# Patient Record
Sex: Female | Born: 1940 | Race: White | Hispanic: No | Smoking: Never smoker
Health system: Southern US, Community
[De-identification: ages and names within clinical notes are randomized; demographics above are authoritative.]

## PROBLEM LIST (undated history)

## (undated) DIAGNOSIS — K219 Gastro-esophageal reflux disease without esophagitis: Secondary | ICD-10-CM

## (undated) DIAGNOSIS — N6019 Diffuse cystic mastopathy of unspecified breast: Secondary | ICD-10-CM

## (undated) DIAGNOSIS — F329 Major depressive disorder, single episode, unspecified: Secondary | ICD-10-CM

## (undated) DIAGNOSIS — I499 Cardiac arrhythmia, unspecified: Secondary | ICD-10-CM

## (undated) DIAGNOSIS — K8010 Calculus of gallbladder with chronic cholecystitis without obstruction: Secondary | ICD-10-CM

## (undated) DIAGNOSIS — R5383 Other fatigue: Secondary | ICD-10-CM

## (undated) DIAGNOSIS — I5032 Chronic diastolic (congestive) heart failure: Secondary | ICD-10-CM

## (undated) DIAGNOSIS — Z972 Presence of dental prosthetic device (complete) (partial): Secondary | ICD-10-CM

## (undated) DIAGNOSIS — Z87440 Personal history of urinary (tract) infections: Secondary | ICD-10-CM

## (undated) DIAGNOSIS — G43909 Migraine, unspecified, not intractable, without status migrainosus: Secondary | ICD-10-CM

## (undated) DIAGNOSIS — Z7901 Long term (current) use of anticoagulants: Secondary | ICD-10-CM

## (undated) DIAGNOSIS — M1990 Unspecified osteoarthritis, unspecified site: Secondary | ICD-10-CM

## (undated) DIAGNOSIS — N189 Chronic kidney disease, unspecified: Secondary | ICD-10-CM

## (undated) DIAGNOSIS — K5790 Diverticulosis of intestine, part unspecified, without perforation or abscess without bleeding: Secondary | ICD-10-CM

## (undated) DIAGNOSIS — I4891 Unspecified atrial fibrillation: Secondary | ICD-10-CM

## (undated) DIAGNOSIS — Z8719 Personal history of other diseases of the digestive system: Secondary | ICD-10-CM

## (undated) DIAGNOSIS — M109 Gout, unspecified: Secondary | ICD-10-CM

## (undated) DIAGNOSIS — I10 Essential (primary) hypertension: Secondary | ICD-10-CM

## (undated) DIAGNOSIS — F32A Depression, unspecified: Secondary | ICD-10-CM

## (undated) DIAGNOSIS — F419 Anxiety disorder, unspecified: Secondary | ICD-10-CM

## (undated) DIAGNOSIS — R0600 Dyspnea, unspecified: Secondary | ICD-10-CM

## (undated) DIAGNOSIS — M545 Low back pain: Secondary | ICD-10-CM

## (undated) DIAGNOSIS — K08109 Complete loss of teeth, unspecified cause, unspecified class: Secondary | ICD-10-CM

## (undated) DIAGNOSIS — I4821 Permanent atrial fibrillation: Secondary | ICD-10-CM

## (undated) DIAGNOSIS — G4700 Insomnia, unspecified: Secondary | ICD-10-CM

## (undated) DIAGNOSIS — K449 Diaphragmatic hernia without obstruction or gangrene: Secondary | ICD-10-CM

## (undated) DIAGNOSIS — E785 Hyperlipidemia, unspecified: Secondary | ICD-10-CM

## (undated) DIAGNOSIS — N393 Stress incontinence (female) (male): Secondary | ICD-10-CM

## (undated) DIAGNOSIS — I872 Venous insufficiency (chronic) (peripheral): Secondary | ICD-10-CM

## (undated) DIAGNOSIS — Z23 Encounter for immunization: Secondary | ICD-10-CM

## (undated) DIAGNOSIS — M8580 Other specified disorders of bone density and structure, unspecified site: Secondary | ICD-10-CM

## (undated) DIAGNOSIS — M419 Scoliosis, unspecified: Secondary | ICD-10-CM

## (undated) DIAGNOSIS — K5730 Diverticulosis of large intestine without perforation or abscess without bleeding: Secondary | ICD-10-CM

## (undated) DIAGNOSIS — N390 Urinary tract infection, site not specified: Secondary | ICD-10-CM

## (undated) DIAGNOSIS — H269 Unspecified cataract: Secondary | ICD-10-CM

## (undated) DIAGNOSIS — R600 Localized edema: Secondary | ICD-10-CM

## (undated) HISTORY — DX: Essential (primary) hypertension: I10

## (undated) HISTORY — PX: OTHER SURGICAL HISTORY: SHX169

## (undated) HISTORY — PX: BREAST CYST ASPIRATION: SHX578

## (undated) HISTORY — DX: Cardiac arrhythmia, unspecified: I49.9

## (undated) HISTORY — DX: Diverticulosis of intestine, part unspecified, without perforation or abscess without bleeding: K57.90

## (undated) HISTORY — DX: Depression, unspecified: F32.A

## (undated) HISTORY — DX: Migraine, unspecified, not intractable, without status migrainosus: G43.909

## (undated) HISTORY — DX: Scoliosis, unspecified: M41.9

## (undated) HISTORY — PX: BREAST EXCISIONAL BIOPSY: SUR124

## (undated) HISTORY — DX: Unspecified atrial fibrillation: I48.91

## (undated) HISTORY — DX: Encounter for immunization: Z23

## (undated) HISTORY — DX: Hyperlipidemia, unspecified: E78.5

## (undated) HISTORY — DX: Low back pain: M54.5

## (undated) HISTORY — DX: Anxiety disorder, unspecified: F41.9

## (undated) HISTORY — DX: Urinary tract infection, site not specified: N39.0

## (undated) HISTORY — DX: Diffuse cystic mastopathy of unspecified breast: N60.19

## (undated) HISTORY — DX: Gout, unspecified: M10.9

## (undated) HISTORY — DX: Personal history of other diseases of the digestive system: Z87.19

## (undated) HISTORY — PX: BLEPHAROPLASTY: SUR158

## (undated) HISTORY — DX: Unspecified cataract: H26.9

## (undated) HISTORY — DX: Other specified disorders of bone density and structure, unspecified site: M85.80

## (undated) HISTORY — DX: Other fatigue: R53.83

## (undated) HISTORY — DX: Insomnia, unspecified: G47.00

## (undated) HISTORY — DX: Major depressive disorder, single episode, unspecified: F32.9

---
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
## 1978-11-07 HISTORY — PX: BREAST SURGERY: SHX581

## 1978-11-07 HISTORY — PX: TUBAL LIGATION: SHX77

## 1999-06-16 ENCOUNTER — Ambulatory Visit (HOSPITAL_COMMUNITY): Admission: RE | Admit: 1999-06-16 | Discharge: 1999-06-16 | Payer: Self-pay | Admitting: *Deleted

## 2000-11-07 HISTORY — PX: KNEE CARTILAGE SURGERY: SHX688

## 2001-11-07 HISTORY — PX: BUNIONECTOMY: SHX129

## 2003-12-15 ENCOUNTER — Encounter: Admission: RE | Admit: 2003-12-15 | Discharge: 2003-12-15 | Payer: Self-pay | Admitting: Family Medicine

## 2004-07-19 ENCOUNTER — Ambulatory Visit: Payer: Self-pay | Admitting: Family Medicine

## 2004-08-27 ENCOUNTER — Ambulatory Visit: Payer: Self-pay | Admitting: Sports Medicine

## 2004-11-07 HISTORY — PX: OTHER SURGICAL HISTORY: SHX169

## 2005-03-17 ENCOUNTER — Ambulatory Visit: Payer: Self-pay | Admitting: Family Medicine

## 2005-05-07 ENCOUNTER — Encounter (INDEPENDENT_AMBULATORY_CARE_PROVIDER_SITE_OTHER): Payer: Self-pay | Admitting: *Deleted

## 2005-05-20 ENCOUNTER — Encounter: Admission: RE | Admit: 2005-05-20 | Discharge: 2005-05-20 | Payer: Self-pay | Admitting: Family Medicine

## 2005-05-24 ENCOUNTER — Ambulatory Visit: Payer: Self-pay | Admitting: Family Medicine

## 2005-06-08 ENCOUNTER — Ambulatory Visit: Payer: Self-pay | Admitting: Sports Medicine

## 2005-06-20 ENCOUNTER — Ambulatory Visit: Payer: Self-pay | Admitting: Family Medicine

## 2006-01-10 ENCOUNTER — Ambulatory Visit: Payer: Self-pay | Admitting: Family Medicine

## 2006-01-17 ENCOUNTER — Ambulatory Visit: Payer: Self-pay | Admitting: Family Medicine

## 2006-09-15 ENCOUNTER — Ambulatory Visit: Payer: Self-pay | Admitting: Family Medicine

## 2006-10-20 ENCOUNTER — Ambulatory Visit: Payer: Self-pay | Admitting: Sports Medicine

## 2007-01-05 ENCOUNTER — Encounter (INDEPENDENT_AMBULATORY_CARE_PROVIDER_SITE_OTHER): Payer: Self-pay | Admitting: *Deleted

## 2007-01-16 ENCOUNTER — Telehealth: Payer: Self-pay | Admitting: *Deleted

## 2007-01-17 DIAGNOSIS — I119 Hypertensive heart disease without heart failure: Secondary | ICD-10-CM

## 2007-03-29 ENCOUNTER — Telehealth: Payer: Self-pay | Admitting: *Deleted

## 2007-05-03 ENCOUNTER — Encounter (INDEPENDENT_AMBULATORY_CARE_PROVIDER_SITE_OTHER): Payer: Self-pay | Admitting: Family Medicine

## 2007-05-03 ENCOUNTER — Ambulatory Visit: Payer: Self-pay | Admitting: Sports Medicine

## 2007-05-03 ENCOUNTER — Telehealth: Payer: Self-pay | Admitting: *Deleted

## 2007-05-03 LAB — CONVERTED CEMR LAB
Bilirubin Urine: NEGATIVE
Blood in Urine, dipstick: NEGATIVE
Glucose, Urine, Semiquant: NEGATIVE
Protein, U semiquant: NEGATIVE
Urobilinogen, UA: 0.2
Whiff Test: NEGATIVE
pH: 7

## 2007-05-06 ENCOUNTER — Encounter (INDEPENDENT_AMBULATORY_CARE_PROVIDER_SITE_OTHER): Payer: Self-pay | Admitting: Family Medicine

## 2007-05-09 ENCOUNTER — Telehealth: Payer: Self-pay | Admitting: *Deleted

## 2007-05-09 ENCOUNTER — Encounter (INDEPENDENT_AMBULATORY_CARE_PROVIDER_SITE_OTHER): Payer: Self-pay | Admitting: Family Medicine

## 2007-05-09 ENCOUNTER — Telehealth (INDEPENDENT_AMBULATORY_CARE_PROVIDER_SITE_OTHER): Payer: Self-pay | Admitting: Family Medicine

## 2007-05-20 ENCOUNTER — Telehealth (INDEPENDENT_AMBULATORY_CARE_PROVIDER_SITE_OTHER): Payer: Self-pay | Admitting: Family Medicine

## 2007-06-06 ENCOUNTER — Encounter: Payer: Self-pay | Admitting: *Deleted

## 2007-06-26 ENCOUNTER — Telehealth: Payer: Self-pay | Admitting: *Deleted

## 2007-07-30 ENCOUNTER — Ambulatory Visit: Payer: Self-pay | Admitting: Family Medicine

## 2007-08-02 ENCOUNTER — Ambulatory Visit: Payer: Self-pay | Admitting: Family Medicine

## 2007-08-24 ENCOUNTER — Telehealth: Payer: Self-pay | Admitting: *Deleted

## 2007-08-27 ENCOUNTER — Encounter: Payer: Self-pay | Admitting: *Deleted

## 2007-10-24 ENCOUNTER — Encounter (INDEPENDENT_AMBULATORY_CARE_PROVIDER_SITE_OTHER): Payer: Self-pay | Admitting: Family Medicine

## 2007-10-25 ENCOUNTER — Telehealth: Payer: Self-pay | Admitting: *Deleted

## 2007-10-26 ENCOUNTER — Ambulatory Visit: Payer: Self-pay | Admitting: Family Medicine

## 2008-01-04 ENCOUNTER — Telehealth (INDEPENDENT_AMBULATORY_CARE_PROVIDER_SITE_OTHER): Payer: Self-pay | Admitting: Family Medicine

## 2008-01-09 ENCOUNTER — Telehealth: Payer: Self-pay | Admitting: *Deleted

## 2008-01-16 ENCOUNTER — Ambulatory Visit: Payer: Self-pay | Admitting: Family Medicine

## 2008-01-16 ENCOUNTER — Telehealth (INDEPENDENT_AMBULATORY_CARE_PROVIDER_SITE_OTHER): Payer: Self-pay | Admitting: *Deleted

## 2008-01-16 ENCOUNTER — Telehealth: Payer: Self-pay | Admitting: *Deleted

## 2008-01-16 ENCOUNTER — Encounter: Payer: Self-pay | Admitting: *Deleted

## 2008-01-25 ENCOUNTER — Telehealth (INDEPENDENT_AMBULATORY_CARE_PROVIDER_SITE_OTHER): Payer: Self-pay | Admitting: *Deleted

## 2008-01-28 ENCOUNTER — Encounter (INDEPENDENT_AMBULATORY_CARE_PROVIDER_SITE_OTHER): Payer: Self-pay | Admitting: Family Medicine

## 2008-01-28 ENCOUNTER — Encounter: Admission: RE | Admit: 2008-01-28 | Discharge: 2008-01-28 | Payer: Self-pay | Admitting: Family Medicine

## 2008-01-31 ENCOUNTER — Ambulatory Visit: Payer: Self-pay | Admitting: Family Medicine

## 2008-01-31 ENCOUNTER — Encounter (INDEPENDENT_AMBULATORY_CARE_PROVIDER_SITE_OTHER): Payer: Self-pay | Admitting: Family Medicine

## 2008-01-31 DIAGNOSIS — M949 Disorder of cartilage, unspecified: Secondary | ICD-10-CM

## 2008-01-31 DIAGNOSIS — M899 Disorder of bone, unspecified: Secondary | ICD-10-CM | POA: Insufficient documentation

## 2008-01-31 LAB — CONVERTED CEMR LAB
ALT: 17 units/L (ref 0–35)
Alkaline Phosphatase: 77 units/L (ref 39–117)
Bilirubin Urine: NEGATIVE
CO2: 28 meq/L (ref 19–32)
Chlamydia, DNA Probe: NEGATIVE
Cholesterol: 234 mg/dL — ABNORMAL HIGH (ref 0–200)
Creatinine, Ser: 0.59 mg/dL (ref 0.40–1.20)
GC Probe Amp, Genital: NEGATIVE
Glucose, Urine, Semiquant: NEGATIVE
Hep B C IgM: NEGATIVE
Ketones, urine, test strip: NEGATIVE
LDL Cholesterol: 134 mg/dL — ABNORMAL HIGH (ref 0–99)
Sodium: 142 meq/L (ref 135–145)
Specific Gravity, Urine: <1.005
Total Bilirubin: 0.9 mg/dL (ref 0.3–1.2)
Total CHOL/HDL Ratio: 3
Total Protein: 7.7 g/dL (ref 6.0–8.3)
Urobilinogen, UA: 0.2
VLDL: 23 mg/dL (ref 0–40)
Vit D, 1,25-Dihydroxy: 33 (ref 30–89)
WBC Urine, dipstick: NEGATIVE
Whiff Test: NEGATIVE
pH: 7

## 2008-02-04 ENCOUNTER — Encounter (INDEPENDENT_AMBULATORY_CARE_PROVIDER_SITE_OTHER): Payer: Self-pay | Admitting: Family Medicine

## 2008-02-06 ENCOUNTER — Encounter (INDEPENDENT_AMBULATORY_CARE_PROVIDER_SITE_OTHER): Payer: Self-pay | Admitting: Family Medicine

## 2008-03-14 ENCOUNTER — Encounter: Payer: Self-pay | Admitting: *Deleted

## 2008-04-17 ENCOUNTER — Telehealth (INDEPENDENT_AMBULATORY_CARE_PROVIDER_SITE_OTHER): Payer: Self-pay | Admitting: Family Medicine

## 2008-04-21 ENCOUNTER — Ambulatory Visit: Payer: Self-pay | Admitting: Family Medicine

## 2008-05-20 ENCOUNTER — Encounter (INDEPENDENT_AMBULATORY_CARE_PROVIDER_SITE_OTHER): Payer: Self-pay | Admitting: Family Medicine

## 2008-08-11 ENCOUNTER — Encounter (INDEPENDENT_AMBULATORY_CARE_PROVIDER_SITE_OTHER): Payer: Self-pay | Admitting: Family Medicine

## 2008-08-29 ENCOUNTER — Telehealth: Payer: Self-pay | Admitting: *Deleted

## 2008-09-04 ENCOUNTER — Encounter (INDEPENDENT_AMBULATORY_CARE_PROVIDER_SITE_OTHER): Payer: Self-pay | Admitting: Family Medicine

## 2008-09-04 ENCOUNTER — Ambulatory Visit: Payer: Self-pay | Admitting: Family Medicine

## 2008-09-04 LAB — CONVERTED CEMR LAB
Chloride: 100 meq/L (ref 96–112)
Glucose, Bld: 107 mg/dL — ABNORMAL HIGH (ref 70–99)
Potassium: 4.6 meq/L (ref 3.5–5.3)
Sodium: 139 meq/L (ref 135–145)

## 2008-09-08 ENCOUNTER — Telehealth: Payer: Self-pay | Admitting: *Deleted

## 2008-10-27 ENCOUNTER — Telehealth: Payer: Self-pay | Admitting: *Deleted

## 2008-10-28 ENCOUNTER — Ambulatory Visit: Payer: Self-pay | Admitting: Family Medicine

## 2008-10-28 ENCOUNTER — Encounter (INDEPENDENT_AMBULATORY_CARE_PROVIDER_SITE_OTHER): Payer: Self-pay | Admitting: Family Medicine

## 2008-11-07 DIAGNOSIS — I5032 Chronic diastolic (congestive) heart failure: Secondary | ICD-10-CM

## 2008-11-07 HISTORY — DX: Chronic diastolic (congestive) heart failure: I50.32

## 2009-03-16 ENCOUNTER — Telehealth: Payer: Self-pay | Admitting: *Deleted

## 2009-05-13 ENCOUNTER — Telehealth: Payer: Self-pay | Admitting: Family Medicine

## 2009-05-15 ENCOUNTER — Telehealth: Payer: Self-pay | Admitting: Family Medicine

## 2009-05-15 ENCOUNTER — Emergency Department (HOSPITAL_COMMUNITY): Admission: EM | Admit: 2009-05-15 | Discharge: 2009-05-15 | Payer: Self-pay | Admitting: Family Medicine

## 2009-09-28 ENCOUNTER — Ambulatory Visit: Payer: Self-pay | Admitting: Family Medicine

## 2009-09-28 DIAGNOSIS — R5381 Other malaise: Secondary | ICD-10-CM | POA: Insufficient documentation

## 2009-09-28 DIAGNOSIS — R5383 Other fatigue: Secondary | ICD-10-CM

## 2009-09-28 DIAGNOSIS — M545 Low back pain: Secondary | ICD-10-CM

## 2009-09-28 DIAGNOSIS — F411 Generalized anxiety disorder: Secondary | ICD-10-CM | POA: Insufficient documentation

## 2009-12-10 ENCOUNTER — Ambulatory Visit: Payer: Self-pay | Admitting: Family Medicine

## 2009-12-30 ENCOUNTER — Encounter: Admission: RE | Admit: 2009-12-30 | Discharge: 2009-12-30 | Payer: Self-pay | Admitting: Family Medicine

## 2009-12-31 LAB — CONVERTED CEMR LAB: Pap Smear: NEGATIVE

## 2010-02-22 ENCOUNTER — Ambulatory Visit (HOSPITAL_BASED_OUTPATIENT_CLINIC_OR_DEPARTMENT_OTHER): Admission: RE | Admit: 2010-02-22 | Discharge: 2010-02-22 | Payer: Self-pay | Admitting: Specialist

## 2010-05-17 ENCOUNTER — Telehealth: Payer: Self-pay | Admitting: Family Medicine

## 2010-07-13 ENCOUNTER — Emergency Department (HOSPITAL_COMMUNITY): Admission: EM | Admit: 2010-07-13 | Discharge: 2010-07-13 | Payer: Self-pay | Admitting: Family Medicine

## 2010-08-10 ENCOUNTER — Encounter: Admission: RE | Admit: 2010-08-10 | Discharge: 2010-08-10 | Payer: Self-pay | Admitting: Gastroenterology

## 2010-09-03 ENCOUNTER — Encounter: Payer: Self-pay | Admitting: Family Medicine

## 2010-09-03 ENCOUNTER — Ambulatory Visit: Payer: Self-pay | Admitting: Family Medicine

## 2010-09-03 ENCOUNTER — Encounter: Payer: Self-pay | Admitting: *Deleted

## 2010-09-03 DIAGNOSIS — I4891 Unspecified atrial fibrillation: Secondary | ICD-10-CM

## 2010-09-03 DIAGNOSIS — I499 Cardiac arrhythmia, unspecified: Secondary | ICD-10-CM | POA: Insufficient documentation

## 2010-09-03 LAB — CONVERTED CEMR LAB
BUN: 9 mg/dL (ref 6–23)
CO2: 28 meq/L (ref 19–32)
Glucose, Bld: 88 mg/dL (ref 70–99)
Magnesium: 2.4 mg/dL (ref 1.5–2.5)
Potassium: 4.5 meq/L (ref 3.5–5.3)
Sodium: 140 meq/L (ref 135–145)
TSH: 1.818 microintl units/mL (ref 0.350–4.500)
Troponin I: 0.01 ng/mL (ref <0.06)

## 2010-09-06 ENCOUNTER — Encounter: Payer: Self-pay | Admitting: Family Medicine

## 2010-09-06 ENCOUNTER — Inpatient Hospital Stay (HOSPITAL_COMMUNITY)
Admission: AD | Admit: 2010-09-06 | Discharge: 2010-09-07 | Payer: Self-pay | Source: Home / Self Care | Admitting: Family Medicine

## 2010-09-06 ENCOUNTER — Ambulatory Visit: Payer: Self-pay | Admitting: Family Medicine

## 2010-09-07 ENCOUNTER — Encounter (INDEPENDENT_AMBULATORY_CARE_PROVIDER_SITE_OTHER): Payer: Self-pay | Admitting: Cardiology

## 2010-09-07 ENCOUNTER — Encounter: Payer: Self-pay | Admitting: *Deleted

## 2010-09-08 ENCOUNTER — Encounter: Payer: Self-pay | Admitting: Family Medicine

## 2010-10-07 ENCOUNTER — Ambulatory Visit: Payer: Self-pay | Admitting: Family Medicine

## 2010-10-13 ENCOUNTER — Ambulatory Visit: Payer: Self-pay | Admitting: Family Medicine

## 2010-11-25 ENCOUNTER — Ambulatory Visit (HOSPITAL_COMMUNITY)
Admission: RE | Admit: 2010-11-25 | Discharge: 2010-11-25 | Payer: Self-pay | Source: Home / Self Care | Attending: Cardiology | Admitting: Cardiology

## 2010-11-30 ENCOUNTER — Encounter (INDEPENDENT_AMBULATORY_CARE_PROVIDER_SITE_OTHER): Payer: Self-pay | Admitting: *Deleted

## 2010-12-07 ENCOUNTER — Other Ambulatory Visit: Payer: Self-pay | Admitting: Family Medicine

## 2010-12-07 DIAGNOSIS — Z1239 Encounter for other screening for malignant neoplasm of breast: Secondary | ICD-10-CM

## 2010-12-07 DIAGNOSIS — Z1231 Encounter for screening mammogram for malignant neoplasm of breast: Secondary | ICD-10-CM

## 2010-12-09 NOTE — Progress Notes (Signed)
Summary: refill  Phone Note Refill Request Call back at Home Phone [phone] Message from:  Patient  Refills Requested: Medication #1:  ZESTORETIC 20-25 MG  TABS 1 by mouth daily.. [facility]  Initial call taken by: Audie Clear,  May 17, 2010 4:43 PM    Prescriptions: ZESTORETIC 20-25 MG  TABS (LISINOPRIL-HYDROCHLOROTHIAZIDE) 1 by mouth daily.  #30 x 6   Entered and Authorized by:   Mylinda Latina MD   Signed by:   Mylinda Latina MD on 05/18/2010   Method used:   Electronically to        [facility] [phone]* (retail)       [place], [place]  [id number]       Ph: [phone] or [phone]       Fax: [phone]   RxID:   [id number]  Pt needs an office visit

## 2010-12-09 NOTE — Miscellaneous (Signed)
Summary: PA required  Clinical Lists Changes  PA required for Pradaxa. Form placed in MD box. Marcell Barlow RN  September 08, 2010 9:21 AM

## 2010-12-09 NOTE — Initial Assessments (Signed)
Summary: Admission at fib with RVR//kh   Vital Signs:  Patient profile:   70 year old female Height:      62.75 inches Weight:      184 pounds BMI:     32.97 Pulse rate:   105 / minute BP standing:   138 / 96  Serial Vital Signs/Assessments:  Time      Position  BP       Pulse  Resp  Temp     By           Lying LA  124/84   106                   Candelaria Celeste MD           Standing  138/96                         Candelaria Celeste MD   Current Problems (verified): 1)  Fibrillation, Atrial  (ICD-427.31) 2)  Abnormal Heart Rhythms  (ICD-427.9) 3)  Need Prophylactic Vaccination&inoculation Flu  (ICD-V04.81) 4)  Anxiety  (ICD-300.00) 5)  Fatigue  (ICD-780.79) 6)  Back Pain, Lumbar  (ICD-724.2) 7)  Osteopenia  (ICD-733.90) 8)  Health Screening  (ICD-V70.0) 9)  Hypertension, Benign Systemic  (ICD-401.1)  Current Medications (verified): 1)  Diazepam 5 Mg Tabs (Diazepam) .... Take 1 Tablet By Mouth At Bedtime 2)  Acyclovir 200 Mg  Caps (Acyclovir) .Marland Kitchen.. 1 By Mouth Three Times A Day As Needed 3)  Zestoretic 20-25 Mg  Tabs (Lisinopril-Hydrochlorothiazide) .Marland Kitchen.. 1  By Mouth Daily.  Allergies (verified): No Known Drug Allergies   Past History:  Past medical, surgical, family and social histories (including risk factors) reviewed, and no changes noted (except as noted below).  Past Medical History: Reviewed history from 09/04/2008 and no changes required. 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 BTL -, Cyst removed from breast -   Family History: Reviewed history from 01/31/2008 and no changes required. one uncle died 12 of MI mother died ovarian at age 79 lung cancer in aunts x2 grandma stroke at age 65, died 25 MI son from 36st died 50 from  cirrhosis, alcoholic other son Copywriter, advertising.born [place] History: Reviewed history from 09/28/2009 and no changes required. drinks 2-3 cups coffee/day; no smoking ; Rare Etoh; Widowed from 3rd marriage.  1st lasted one year(age[age]); 15 cats.   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   exercises 2-3 x week - belly dancing.  mormon.      Complete Medication List: 1)  Diazepam 5 Mg Tabs (Diazepam) .... Take 1 tablet by mouth at bedtime 2)  Acyclovir 200 Mg Caps (Acyclovir) .Marland Kitchen.. 1 by mouth three times a day as needed 3)  Zestoretic 20-25 Mg Tabs (Lisinopril-hydrochlorothiazide) .Marland Kitchen.. 1  by mouth daily.  Other Orders: [facility]- Est Level  3 [id number])   Orders Added: 1)  [facility]- Est Level  3 [id number]   Sodium                    140 mEq/L  135-145   Potassium                 4.5 mEq/L                   3.5-5.3   Chloride                  102 mEq/L                   96-112   CO2                       28 mEq/L                    19-32   Glucose                   88 mg/dL                    70-99   BUN                       9 mg/dL                     6-23   Creatinine                0.74 mg/dL                  0.40-1.20   Calcium                   9.5 mg/dL                   8.4-10.5 ! Est GFR, African American                             >60 mL/min                  >60 ! Est GFR, NonAfrican American                             >60 mL/min                  >60  Tests: (2) PT (Prothrombin Time) (22000)  PT (Prothrombin Time)                             12.5 seconds                11.6-15.2   INR                       0.91                        <1.50     The INR is of principal utility in following patients on stable doses     of oral anticoagulants.  The therapeutic range is generally 2.0 to     3.0, but may be 3.0 to 4.0 in  patients with mechanical cardiac valves,     recurrent embolisms and antiphospholipid antibodies (including lupus     inhibitors).  Tests: (3) PTT-Partial Thromboplastin Time (22010)  PTT-Partial Thromboplastin Time  28 seconds                  24-37     This test is for screening purposes only; it should not be used for     therapeutic unfractionated heparin monitoring.  Please refer to     Heparin Anti-Xa [id number]).  Tests: (4) Magnesium (23200)   Magnesium                 2.4 mg/dL                   1.5-2.5  Tests: (5) Troponin I [id number])   Troponin I                0.01 ng/mL                  <0.06     No indication of myocardial injury.  Tests: (6) TSH (23280)   TSH                       1.818 uIU/mL                0.350-4.500  Document Creation Date: 09/03/2010 4:26 PM  Patient planned reviewed with Dr Mingo Amber. Candelaria Celeste MD  September 06, 2010 1:01 PM  ______________________________________________

## 2010-12-09 NOTE — Assessment & Plan Note (Signed)
Summary: hosp f/u [facility]   Vital Signs:  Patient profile:   70 year old female Weight:      187.2 pounds (85.09 kg) BMI:     33.55 Temp:     98.1 degrees F (36.72 degrees C) oral Pulse rate:   70 / minute BP sitting:   98 / 63  (right arm)  Vitals Entered By: Rocky Morel) (October 13, 2010 3:45 PM)  Primary Care Provider:  Mylinda Latina MD   History of Present Illness: 1. AFib: Pt is a 70 yo F w/ new-onset AFib here to f/u from her recent hospitalization (d/c on 09/07/10). She was put on Pradaxa and Metoprolol. She has not had any side effects from her medications. She has followed up w/ cards who want to cardiovert her possibly sometime next week.  ROS:  Denies SOB, palpitations, CP.   2. HTN: Somewhat hypotensive at this visit. No dizziness, orthostasis or other symptoms. Takes medications as prescribed.   3. Lip infection: much improved from last visit.  It is no longer swollen or tender.  ROS: denies fevers  Current Medications (verified): 1)  Diazepam 5 Mg Tabs (Diazepam) .... Take 1 Tablet By Mouth At Bedtime 2)  Acyclovir 200 Mg  Caps (Acyclovir) .Marland Kitchen.. 1 By Mouth Three Times A Day As Needed 3)  Zestoretic 20-25 Mg  Tabs (Lisinopril-Hydrochlorothiazide) .Marland Kitchen.. 1  By Mouth Daily. 4)  Pradaxa 150 Mg Caps (Dabigatran Etexilate Mesylate) .Marland Kitchen.. 1 Tab By Mouth Bid 5)  Metoprolol Tartrate 50 Mg Tabs (Metoprolol Tartrate) .Marland Kitchen.. 1 Tab By Mouth Twice A Day 6)  Calcium 1500 Mg Tabs (Calcium Carbonate) .Marland Kitchen.. 1 Tab By Mouth Daily 7)  Lisinopril-Hydrochlorothiazide 20-25 Mg Tabs (Lisinopril-Hydrochlorothiazide) .Marland Kitchen.. 1 Tab By Mouth Daily 8)  B Complex  Tabs (B Complex Vitamins) .Marland Kitchen.. 1 Tab By Mouth Daily 9)  Vitamin C 100 Mg Tabs (Ascorbic Acid) .Marland Kitchen.. 1 Tab By Mouth Daily  Allergies: No Known Drug Allergies  Social History: Reviewed history from 09/28/2009 and no changes required. drinks 2-3 cups coffee/day; no smoking ; Rare Etoh; Widowed from 3rd marriage.  1st lasted one  year(age[age]); 15 cats.   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   exercises 2-3 x week - belly dancing.  mormon.   Physical Exam  General:  Well-developed,well-nourished,in no acute distress; alert,appropriate and cooperative throughout examination Head:  Normocephalic and atraumatic without obvious abnormalities Mouth:  Lesion is much improved.  Lip nearly completely healed. No other oral lesions noted  Neck:  no LAD Lungs:  Normal respiratory effort, chest expands symmetrically. Lungs are clear to auscultation, no crackles or wheezes. Heart:  Irregularly irregular rhythm.  No murmurs Abdomen:  Bowel sounds positive,abdomen soft and non-tender without masses, organomegaly or hernias noted. Pulses:  R and L carotid and radial pulses are full and equal bilaterally Extremities:  no LE edema   Impression & Recommendations:  Problem # 1:  FIBRILLATION, ATRIAL (ICD-427.31)  Cardiology plans to cardiovert her in the near future. No medication side effects.  Her updated medication list for this problem includes:    Metoprolol Tartrate 50 Mg Tabs (Metoprolol tartrate) .Marland Kitchen... 1 tab by mouth twice a day  Orders: [place]- Est  Level 4 [id number])  Problem # 2:  HYPERTENSION, BENIGN SYSTEMIC (ICD-401.1) Assessment: Unchanged  No medication side effects noted. BP well-controlled.  Her updated medication list for this problem  includes:    Zestoretic 20-25 Mg Tabs (Lisinopril-hydrochlorothiazide) .Marland Kitchen... 1  by mouth daily.    Metoprolol Tartrate 50 Mg Tabs (Metoprolol tartrate) .Marland Kitchen... 1 tab by mouth twice a day    Lisinopril-hydrochlorothiazide 20-25 Mg Tabs (Lisinopril-hydrochlorothiazide) .Marland Kitchen... 1 tab by mouth daily  Orders: [place]- Est  Level 4 (99214)  Problem # 3:  CELLULITIS AND ABSCESS OF FACE (ICD-682.0) Assessment: Improved  Nearly completely healed.  No  further follow up needed.  Orders: [place]- Est  Level 4 [id number])  Complete Medication List: 1)  Diazepam 5 Mg Tabs (Diazepam) .... Take 1 tablet by mouth at bedtime 2)  Acyclovir 200 Mg Caps (Acyclovir) .Marland Kitchen.. 1 by mouth three times a day as needed 3)  Zestoretic 20-25 Mg Tabs (Lisinopril-hydrochlorothiazide) .Marland Kitchen.. 1  by mouth daily. 4)  Pradaxa 150 Mg Caps (Dabigatran etexilate mesylate) .Marland Kitchen.. 1 tab by mouth bid 5)  Metoprolol Tartrate 50 Mg Tabs (Metoprolol tartrate) .Marland Kitchen.. 1 tab by mouth twice a day 6)  Calcium 1500 Mg Tabs (Calcium carbonate) .Marland Kitchen.. 1 tab by mouth daily 7)  Lisinopril-hydrochlorothiazide 20-25 Mg Tabs (Lisinopril-hydrochlorothiazide) .Marland Kitchen.. 1 tab by mouth daily 8)  B Complex Tabs (B complex vitamins) .Marland Kitchen.. 1 tab by mouth daily 9)  Vitamin C 100 Mg Tabs (Ascorbic acid) .Marland Kitchen.. 1 tab by mouth daily  Patient Instructions: 1)  You are doing well 2)  Follow up with cardiology for decision on cardioversion 3)  Please schedule a follow up appointment with me in 3 months   Orders Added: 1)  [place]- Est  Level 4 [id number]

## 2010-12-09 NOTE — Assessment & Plan Note (Signed)
Summary: flu shot,tcb  Nurse Visit   Vital Signs:  Patient profile:   70 year old female Temp:     98.4 degrees F  Vitals Entered By: Marcell Barlow RN (December 10, 2009 3:06 PM)  Allergies: No Known Drug Allergies  Immunizations Administered:  Influenza Vaccine # 1:    Vaccine Type: Fluvax MCR    Site: left deltoid    Mfr: GlaxoSmithKline    Dose: 0.5 ml    Route: IM    Given by: Marcell Barlow RN    Exp. Date: 05/06/2010    Lot #: [id number]    VIS given: 06/16/2009  Flu Vaccine Consent Questions:    Do you have a history of severe allergic reactions to this vaccine? no    Any prior history of allergic reactions to egg and/or gelatin? no    Do you have a sensitivity to the preservative Thimersol? no    Do you have a past history of Guillan-Barre Syndrome? no    Do you currently have an acute febrile illness? no    Have you ever had a severe reaction to latex? no    Vaccine information given and explained to patient? yes    Are you currently pregnant? no  Orders Added: 1)  Influenza Vaccine MCR [00025] 2)  Admin 1st Vaccine [id number]

## 2010-12-09 NOTE — Assessment & Plan Note (Signed)
Summary: irregular heartbeat, referral from Dr. Luther Redo   Vital Signs:  Patient profile:   70 year old female Height:      62.75 inches Weight:      184 pounds BMI:     32.97 Temp:     97.7 degrees F oral Pulse rate:   65 / minute BP sitting:   118 / 79  (left arm) Cuff size:   regular  Vitals Entered By: Schuyler Amor CMA (September 03, 2010 11:29 AM) CC: irregular heartbeat Pain Assessment Patient in pain? no        CC:  irregular heartbeat.  History of Present Illness: She was sent over from her gastroenterologist's office for irregular pulse and tachycardia. She's been seeing Dr Penelope Coop for 2 recent bouts of diverticulitis. She notes many months of heart irregularity and palpitations, but was cleared for surgery this year. On one urgent care visit she was noted to be bradycardic to 50 as she recalls (rate of 57 seen on [facility] visit, no notation of irregular HR on any of 3 visits there. No chest pain or short of breath and she remains active in dance presentations. Is stressed in helping her 22 year old husband who has his own place. He frequently falls so she is teaching him how to get himself back up.   Allergies: No Known Drug Allergies  Physical Exam  General:  alert, well-developed, and overweight-appearing.   Neck:  No deformities, masses, or tenderness noted. No thyromegaly Lungs:  Normal respiratory effort, chest expands symmetrically. Lungs are clear to auscultation, no crackles or wheezes. Heart:  Irregularly  irregular rhythm.  Rate 111 on EKG which confirmed atrial fibrillation, no murmur, no JVD, no HJR Abdomen:  soft, non-tender, and normal bowel sounds.   Extremities:  No edema or calf tenderness.   Impression & Recommendations:  Problem # 1:  FIBRILLATION, ATRIAL (ICD-427.31) We discussed hospitalization, but she wishes to defer this for 3 days to make arrangements. Her ChadscDS2 Vasc score of 3 gives a 3.2 % risk of CVA. She was informed of this. This appears  to be chronic, perhaps paroxysmal. She will continues aspirin and we'll decide on anticoagulation on Monday. I decided against adding Metoprolol when she gave the history of bradycardia in case she has brady-tachy syndrome. Given instructions regarding urgent symptoms.  The following medications were removed from the medication list:    Metoprolol Tartrate 50 Mg Tabs (Metoprolol tartrate) .Marland Kitchen... Take one tablet daily  Orders: [facility]- Est  Level 4 (99214) TSH-[facility] [id number]) PTT-[facility] [id number]) INR/PT-[facility] [id number]) Basic Met-[facility] [id number]) Miscellaneous Lab Charge-[facility] [id number]) 2 D Echo (2 D Echo)  Problem # 2:  ANXIETY (ICD-300.00) Advised her this should be changed in the future due to fall risk.  Her updated medication list for this problem includes:    Diazepam 5 Mg Tabs (Diazepam) .Marland Kitchen... Take 1 tablet by mouth at bedtime  Orders: [place]- Est  Level 4 [id number])  Problem # 3:  HYPERTENSION, BENIGN SYSTEMIC (ICD-401.1)  The following medications were removed from the medication list:    Metoprolol Tartrate 50 Mg Tabs (Metoprolol tartrate) .Marland Kitchen... Take one tablet daily Never was started due to history of bradycardia and low normal blood pressure. should be started while monitored.  Her updated medication list for this problem includes:    Zestoretic 20-25 Mg Tabs (Lisinopril-hydrochlorothiazide) .Marland Kitchen... 1  by mouth daily.  Orders: [facility]- Est  Level 4 [id number]) Basic Met-[facility] [id number]) Magnesium-[facility] [id number])  Complete Medication List: 1)  Diazepam 5 Mg  Tabs (Diazepam) .... Take 1 tablet by mouth at bedtime 2)  Acyclovir 200 Mg Caps (Acyclovir) .Marland Kitchen.. 1 by mouth three times a day as needed 3)  Zestoretic 20-25 Mg Tabs (Lisinopril-hydrochlorothiazide) .Marland Kitchen.. 1  by mouth daily.  Other Orders: [facility]KG- [facility] (EKG)  Patient Instructions: 1)  You have atrial fibrillation  2)  Please return Monday AM  to see Dr Walker Kehr  to check your blood pressure and heart rate.  Come prepared to go into the  hospital.  3)  Go to the hospital ER immediately if you have chest pain, rapid heart rate or feel like you are going to pass out.  Prescriptions: METOPROLOL TARTRATE 50 MG TABS (METOPROLOL TARTRATE) Take one tablet daily  #30 x 3   Entered and Authorized by:   Candelaria Celeste MD   Signed by:   Candelaria Celeste MD on 09/03/2010   Method used:   Electronically to        [facility] [phone]* (retail)       [place], [place]  [id number]       Ph: [phone] or [phone]       Fax: [phone]   RxID:   [id number]    Orders Added: 1)  [facility]KG- [facility] [EKG] 2)  [facility]- Est  Level 4 [99214] 3)  TSH-[facility] [id number] 4)  PTT-[facility] [id number] 5)  INR/PT-[facility] [85610] 6)  Basic Met-[facility] [id number] 7)  Magnesium-[facility] [id number] 8)  Miscellaneous Lab Charge-[facility] [99999] 9)  2 D Echo [2 D Echo]

## 2010-12-09 NOTE — Assessment & Plan Note (Signed)
Summary: infection to lip/saunders/bmc   Vital Signs:  Patient profile:   70 year old female Height:      62.75 inches Weight:      187.44 pounds BMI:     33.59 BSA:     1.88 Temp:     98.3 degrees F Pulse rate:   100 / minute BP sitting:   108 / 75  Vitals Entered By: Christen Bame CMA (October 07, 2010 11:03 AM) CC: infection on lips x 4 days Is Patient Diabetic? No Pain Assessment Patient in pain? yes     Location: lip Intensity: 1   Primary Care Hero Kulish:  Mylinda Latina MD  CC:  infection on lips x 4 days.  History of Present Illness: 1) Sore on low lip: Patient reports that she was playing with one of her cats (she rescues feral cats) 5 days ago, when it scratched her in the middle of her lower lip. The next day an area of swelling, redness and pain appeared at her left lower lip. A "pus pocket" developed which she drained yesterday using a "sterilized" sowing needle - she was able to obtain a small amount of white pus. Since then the swelling, redness and pain have significantly improved and the lesion has started to "crust over". Has been treated for cellulitis associated with cat-scratches (w/ doxycycline) within the past few months). 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   ROS: Denies lesions elsewhere, lymphadenopathy, streaking erythema, fever, nausea, headache, emesis, diarrhea, lethargy, trisums.   Habits & Providers  Alcohol-Tobacco-Diet     Tobacco Status: never  Current Medications (verified): 1)  Diazepam 5 Mg Tabs (Diazepam) .... Take 1 Tablet By Mouth At Bedtime 2)  Acyclovir 200 Mg  Caps (Acyclovir) .Marland Kitchen.. 1 By Mouth Three Times A Day As Needed 3)  Zestoretic 20-25 Mg  Tabs (Lisinopril-Hydrochlorothiazide) .Marland Kitchen.. 1  By Mouth Daily.  Allergies (verified): No Known Drug Allergies  Physical Exam  General:  alert, well-developed, and overweight-appearing. vitals reviewed. NAD  Mouth:  single  vesicular / pustular lesion on erythematous background 67mm x 2 mm at vermilion border of left lower lip. No other oral lesions noted  Neck:  no lymphadenopathy   Skin:  see mouth exam    Impression & Recommendations:  Problem # 1:  CELLULITIS AND ABSCESS OF FACE (ICD-682.0) Assessment New  Cellultiis / abscess (given history) vs herpetic flare (more consistent with history and clinical exam). Clinical course improving per patient. If herpes, likely little to no benefit for acyclovir at this time given improving course. Also given I+D at home and improving clinical course liekly to be little to no benefit from oral antibiotics. Advised regarding warm compresses. Advised regarding red flags that would prompt return to care. Plan for follow up with PCP next week.   Orders: [place]- Est Level  3 [id number])  Complete Medication List: 1)  Diazepam 5 Mg Tabs (Diazepam) .... Take 1 tablet by mouth at bedtime 2)  Acyclovir 200 Mg Caps (Acyclovir) .Marland Kitchen.. 1 by mouth three times a day as needed 3)  Zestoretic 20-25 Mg Tabs (Lisinopril-hydrochlorothiazide) .Marland Kitchen.. 1  by mouth daily.   Orders Added: 1)  [place]- Est Level  3 [id number]

## 2010-12-09 NOTE — Miscellaneous (Signed)
Summary: re: 2-D Echo/TS  Clinical Lists Changes unable to reach pt. 'number is temp. d/c'. second # unable to reach.  sched. appt at [facility] 09-23-10 at 2 pm. faxed order and  mailed letter to pt with appt info.  Mauricia Area CMA,  September 07, 2010 9:27 AM   Appended Document: re: 2-D Echo/TS CALLED AND CANCELLED APPT. PT IS IN HOSPITAL.

## 2010-12-09 NOTE — Miscellaneous (Signed)
Summary: call form Dr. Estell Harpin office  received call from Dr. Estell Harpin office earlier today stating Dr. Penelope Coop requests patient have EKG done today because she was noted to have irregular heartbeat today on exam in his office. appointment scheduled for work in appointment today. Marcell Barlow RN  September 03, 2010 11:10 AM

## 2010-12-09 NOTE — Letter (Signed)
Summary: Generic Letter  [facility]  [address]   [place], [place] [address]   Phone: [phone]  Fax: [phone]    11/30/2010  [place], [place]  [id number]  Dear Ms. Nickolas,  We are happy to let you know that since you are covered under Medicare you are able to have a FREE visit at the [facility] to discuss your HEALTH. This is a new benefit for Medicare.  There will be no co-payment.  At this visit you will meet with Lamont Dowdy an expert in wellness and the health coach at our clinic.  At this visit we will discuss ways to keep you healthy and feeling well.  This visit will not replace your regular doctor visit and we cannot refill medications.     You will need to plan to be here at least one hour to talk about your medical history, your current status, review all of your medications, and discuss your future plans for your health.  This information will be entered into your record for your doctor to have and review.  If you are interested in staying healthy, this type of visit can help.  Please call the office at: [phone], to schedule a "Medicare Wellness Visit".  The day of the visit you should bring in all of your medications, including any vitamins, herbs, over the counter products you take.  Make a list of all the other doctors that you see, so we know who they are. If you have any other health documents please bring them.  We look forward to helping you stay healthy.  Sincerely,   Suzanne Lineberry [place]

## 2010-12-14 ENCOUNTER — Emergency Department (HOSPITAL_COMMUNITY): Payer: MEDICARE

## 2010-12-14 ENCOUNTER — Inpatient Hospital Stay (INDEPENDENT_AMBULATORY_CARE_PROVIDER_SITE_OTHER)
Admission: RE | Admit: 2010-12-14 | Discharge: 2010-12-14 | Disposition: A | Discharge status: Home / Self Care | Payer: MEDICARE | Source: Ambulatory Visit | Attending: Emergency Medicine | Admitting: Emergency Medicine

## 2010-12-14 ENCOUNTER — Inpatient Hospital Stay (HOSPITAL_COMMUNITY)
Admission: EM | Admit: 2010-12-14 | Discharge: 2010-12-19 | DRG: 208 | Disposition: A | Discharge status: Home Health | Payer: MEDICARE | Attending: Cardiology | Admitting: Cardiology

## 2010-12-14 DIAGNOSIS — I4891 Unspecified atrial fibrillation: Secondary | ICD-10-CM | POA: Diagnosis present

## 2010-12-14 DIAGNOSIS — J9600 Acute respiratory failure, unspecified whether with hypoxia or hypercapnia: Principal | ICD-10-CM | POA: Diagnosis present

## 2010-12-14 DIAGNOSIS — I498 Other specified cardiac arrhythmias: Secondary | ICD-10-CM | POA: Diagnosis present

## 2010-12-14 DIAGNOSIS — I059 Rheumatic mitral valve disease, unspecified: Secondary | ICD-10-CM | POA: Diagnosis present

## 2010-12-14 DIAGNOSIS — F411 Generalized anxiety disorder: Secondary | ICD-10-CM | POA: Diagnosis present

## 2010-12-14 DIAGNOSIS — R7881 Bacteremia: Secondary | ICD-10-CM | POA: Diagnosis present

## 2010-12-14 DIAGNOSIS — R9431 Abnormal electrocardiogram [ECG] [EKG]: Secondary | ICD-10-CM | POA: Diagnosis present

## 2010-12-14 DIAGNOSIS — N390 Urinary tract infection, site not specified: Secondary | ICD-10-CM | POA: Diagnosis present

## 2010-12-14 DIAGNOSIS — B9689 Other specified bacterial agents as the cause of diseases classified elsewhere: Secondary | ICD-10-CM | POA: Diagnosis present

## 2010-12-14 DIAGNOSIS — J189 Pneumonia, unspecified organism: Secondary | ICD-10-CM | POA: Diagnosis present

## 2010-12-14 DIAGNOSIS — Z7901 Long term (current) use of anticoagulants: Secondary | ICD-10-CM

## 2010-12-14 DIAGNOSIS — R0602 Shortness of breath: Secondary | ICD-10-CM

## 2010-12-14 DIAGNOSIS — E876 Hypokalemia: Secondary | ICD-10-CM | POA: Diagnosis present

## 2010-12-14 DIAGNOSIS — I10 Essential (primary) hypertension: Secondary | ICD-10-CM | POA: Diagnosis present

## 2010-12-14 DIAGNOSIS — I440 Atrioventricular block, first degree: Secondary | ICD-10-CM | POA: Diagnosis present

## 2010-12-14 DIAGNOSIS — E669 Obesity, unspecified: Secondary | ICD-10-CM | POA: Diagnosis present

## 2010-12-14 DIAGNOSIS — J810 Acute pulmonary edema: Secondary | ICD-10-CM | POA: Diagnosis present

## 2010-12-14 LAB — POCT CARDIAC MARKERS
CKMB, poc: 1.3 ng/mL (ref 1.0–8.0)
Myoglobin, poc: 83.3 ng/mL (ref 12–200)
Troponin i, poc: <0.05 ng/mL (ref 0.00–0.09)

## 2010-12-14 LAB — BASIC METABOLIC PANEL
BUN: 17 mg/dL (ref 6–23)
GFR calc Af Amer: >60 mL/min (ref >60)
GFR calc non Af Amer: >60 mL/min (ref >60)
Potassium: 3.8 mEq/L (ref 3.5–5.1)
Sodium: 136 mEq/L (ref 135–145)

## 2010-12-14 LAB — CBC
Platelets: 265 10*3/uL (ref 150–400)
RDW: 13.6 % (ref 11.5–15.5)
WBC: 10.5 10*3/uL (ref 4.0–10.5)

## 2010-12-15 ENCOUNTER — Emergency Department (HOSPITAL_COMMUNITY): Payer: MEDICARE

## 2010-12-15 ENCOUNTER — Inpatient Hospital Stay (HOSPITAL_COMMUNITY): Payer: MEDICARE

## 2010-12-15 DIAGNOSIS — J810 Acute pulmonary edema: Secondary | ICD-10-CM

## 2010-12-15 DIAGNOSIS — J9600 Acute respiratory failure, unspecified whether with hypoxia or hypercapnia: Secondary | ICD-10-CM

## 2010-12-15 LAB — BASIC METABOLIC PANEL
BUN: 15 mg/dL (ref 6–23)
CO2: 25 mEq/L (ref 19–32)
Calcium: 8.6 mg/dL (ref 8.4–10.5)
Chloride: 100 mEq/L (ref 96–112)
Creatinine, Ser: 1.04 mg/dL (ref 0.4–1.2)
GFR calc Af Amer: >60 mL/min (ref >60)
Glucose, Bld: 165 mg/dL — ABNORMAL HIGH (ref 70–99)

## 2010-12-15 LAB — POCT I-STAT 3, ART BLOOD GAS (G3+)
Acid-Base Excess: 3 mmol/L — ABNORMAL HIGH (ref 0.0–2.0)
Acid-Base Excess: 7 mmol/L — ABNORMAL HIGH (ref 0.0–2.0)
Acid-base deficit: 2 mmol/L (ref 0.0–2.0)
Bicarbonate: 29.7 mEq/L — ABNORMAL HIGH (ref 20.0–24.0)
O2 Saturation: 84 %
O2 Saturation: 98 %
O2 Saturation: 95 %
Patient temperature: 99.9
TCO2: 25 mmol/L (ref 0–100)
TCO2: 31 mmol/L (ref 0–100)
pCO2 arterial: 39.1 mmHg (ref 35.0–45.0)
pH, Arterial: 7.44 — ABNORMAL HIGH (ref 7.350–7.400)
pH, Arterial: 7.425 — ABNORMAL HIGH (ref 7.350–7.400)
pO2, Arterial: 108 mmHg — ABNORMAL HIGH (ref 80.0–100.0)

## 2010-12-15 LAB — CK TOTAL AND CKMB (NOT AT ARMC)
CK, MB: 1.6 ng/mL (ref 0.3–4.0)
Relative Index: INVALID (ref 0.0–2.5)
Total CK: 78 U/L (ref 7–177)

## 2010-12-15 LAB — CBC
Hemoglobin: 13.6 g/dL (ref 12.0–15.0)
MCH: 29.4 pg (ref 26.0–34.0)
MCHC: 33.3 g/dL (ref 30.0–36.0)
MCV: 88.5 fL (ref 78.0–100.0)

## 2010-12-15 LAB — CARDIAC PANEL(CRET KIN+CKTOT+MB+TROPI)
CK, MB: 4.4 ng/mL — ABNORMAL HIGH (ref 0.3–4.0)
Total CK: 202 U/L — ABNORMAL HIGH (ref 7–177)
Troponin I: 0.77 ng/mL (ref 0.00–0.06)

## 2010-12-15 LAB — RAPID URINE DRUG SCREEN, HOSP PERFORMED
Amphetamines: NOT DETECTED
Opiates: NOT DETECTED
Tetrahydrocannabinol: NOT DETECTED

## 2010-12-15 LAB — GLUCOSE, CAPILLARY
Glucose-Capillary: 181 mg/dL — ABNORMAL HIGH (ref 70–99)
Glucose-Capillary: 140 mg/dL — ABNORMAL HIGH (ref 70–99)
Glucose-Capillary: 136 mg/dL — ABNORMAL HIGH (ref 70–99)

## 2010-12-15 LAB — HEPARIN LEVEL (UNFRACTIONATED)
Heparin Unfractionated: 0.2 IU/mL — ABNORMAL LOW (ref 0.30–0.70)
Heparin Unfractionated: 0.16 IU/mL — ABNORMAL LOW (ref 0.30–0.70)

## 2010-12-15 LAB — MRSA PCR SCREENING: MRSA by PCR: NEGATIVE

## 2010-12-15 MED ORDER — IOHEXOL 350 MG/ML SOLN
100.0000 mL | Freq: Once | INTRAVENOUS | Status: AC | PRN
  Administered 2010-12-15: 100 mL via INTRAVENOUS

## 2010-12-15 NOTE — Op Note (Signed)
  NAMETERIANNE, [name]              ACCOUNT NO.:  [id number]  MEDICAL RECORD NO.:  [id number]          PATIENT TYPE:  OIB  LOCATION:  2899                         FACILITY:  [facility]  PHYSICIAN:  Ezzard Standing, M.D.DATE OF BIRTH:  30-Mar-1941  DATE OF PROCEDURE:  11/25/2010                               OPERATIVE REPORT   CARDIOVERSION NOTE  INDICATIONS:  Persistent atrial fibrillation in a patient who is symptomatic.  The patient was brought to the outpatient short-stay center and prepped, and anesthesia was administered by Dr. Rica Koyanagi with 85 mg of propofol.  Cardioversion was done with synchronized biphasic defibrillation with 100 watts resulting in reversion to sinus bradycardia.  She tolerated the procedure well.  IMPRESSION:  Successful cardioversion of atrial fibrillation.     Ezzard Standing, M.D.     WST/MEDQ  D:  11/25/2010  T:  11/25/2010  Job:  [id number]  cc:   Patrick Jupiter A. Walker Kehr, M.D.  Electronically Signed by Viona Gilmore. Wynonia Lawman M.D. on 12/15/2010 09:23:43 AM

## 2010-12-16 ENCOUNTER — Inpatient Hospital Stay (HOSPITAL_COMMUNITY): Payer: MEDICARE

## 2010-12-16 LAB — PROTIME-INR
INR: 1.12 (ref 0.00–1.49)
Prothrombin Time: 14.6 seconds (ref 11.6–15.2)

## 2010-12-16 LAB — CBC
HCT: 37.2 % (ref 36.0–46.0)
MCH: 30.2 pg (ref 26.0–34.0)
MCHC: 34.1 g/dL (ref 30.0–36.0)
MCV: 88.6 fL (ref 78.0–100.0)
RDW: 13.9 % (ref 11.5–15.5)
WBC: 16.1 10*3/uL — ABNORMAL HIGH (ref 4.0–10.5)

## 2010-12-16 LAB — BASIC METABOLIC PANEL
BUN: 18 mg/dL (ref 6–23)
GFR calc non Af Amer: 47 mL/min — ABNORMAL LOW (ref >60)
Glucose, Bld: 130 mg/dL — ABNORMAL HIGH (ref 70–99)
Potassium: 3.3 mEq/L — ABNORMAL LOW (ref 3.5–5.1)

## 2010-12-16 LAB — GLUCOSE, CAPILLARY: Glucose-Capillary: 135 mg/dL — ABNORMAL HIGH (ref 70–99)

## 2010-12-17 ENCOUNTER — Inpatient Hospital Stay (HOSPITAL_COMMUNITY): Payer: MEDICARE

## 2010-12-17 LAB — BASIC METABOLIC PANEL
BUN: 11 mg/dL (ref 6–23)
Chloride: 101 mEq/L (ref 96–112)
Glucose, Bld: 107 mg/dL — ABNORMAL HIGH (ref 70–99)
Potassium: 3.9 mEq/L (ref 3.5–5.1)

## 2010-12-17 LAB — CULTURE, BLOOD (ROUTINE X 2): Culture  Setup Time: 201202080911

## 2010-12-17 LAB — CBC
Hemoglobin: 11.9 g/dL — ABNORMAL LOW (ref 12.0–15.0)
MCH: 29.4 pg (ref 26.0–34.0)
MCV: 88.6 fL (ref 78.0–100.0)
RBC: 4.05 MIL/uL (ref 3.87–5.11)

## 2010-12-17 LAB — GLUCOSE, CAPILLARY
Glucose-Capillary: 109 mg/dL — ABNORMAL HIGH (ref 70–99)
Glucose-Capillary: 102 mg/dL — ABNORMAL HIGH (ref 70–99)
Glucose-Capillary: 101 mg/dL — ABNORMAL HIGH (ref 70–99)
Glucose-Capillary: 102 mg/dL — ABNORMAL HIGH (ref 70–99)
Glucose-Capillary: 127 mg/dL — ABNORMAL HIGH (ref 70–99)

## 2010-12-17 LAB — CULTURE, RESPIRATORY W GRAM STAIN: Culture: NO GROWTH

## 2010-12-17 LAB — BRAIN NATRIURETIC PEPTIDE: Pro B Natriuretic peptide (BNP): 159 pg/mL — ABNORMAL HIGH (ref 0.0–100.0)

## 2010-12-17 LAB — POCT ACTIVATED CLOTTING TIME: Activated Clotting Time: 152 seconds

## 2010-12-18 LAB — GLUCOSE, CAPILLARY
Glucose-Capillary: 115 mg/dL — ABNORMAL HIGH (ref 70–99)
Glucose-Capillary: 136 mg/dL — ABNORMAL HIGH (ref 70–99)
Glucose-Capillary: 94 mg/dL (ref 70–99)

## 2010-12-18 LAB — BASIC METABOLIC PANEL
CO2: 27 mEq/L (ref 19–32)
Calcium: 8.9 mg/dL (ref 8.4–10.5)
Chloride: 103 mEq/L (ref 96–112)
GFR calc Af Amer: >60 mL/min (ref >60)
Sodium: 138 mEq/L (ref 135–145)

## 2010-12-18 LAB — CBC
Hemoglobin: 12.3 g/dL (ref 12.0–15.0)
RBC: 4.21 MIL/uL (ref 3.87–5.11)

## 2010-12-19 LAB — BASIC METABOLIC PANEL
BUN: 12 mg/dL (ref 6–23)
Chloride: 99 mEq/L (ref 96–112)
Creatinine, Ser: 0.76 mg/dL (ref 0.4–1.2)
Glucose, Bld: 107 mg/dL — ABNORMAL HIGH (ref 70–99)

## 2010-12-19 LAB — CBC
HCT: 37.4 % (ref 36.0–46.0)
MCH: 28.6 pg (ref 26.0–34.0)
MCV: 87.6 fL (ref 78.0–100.0)
Platelets: 289 10*3/uL (ref 150–400)
RBC: 4.27 MIL/uL (ref 3.87–5.11)

## 2010-12-19 LAB — GLUCOSE, CAPILLARY: Glucose-Capillary: 123 mg/dL — ABNORMAL HIGH (ref 70–99)

## 2010-12-19 NOTE — H&P (Signed)
[name], STRODE              ACCOUNT NO.:  [id number]  MEDICAL RECORD NO.:  [id number]           PATIENT TYPE:  I  LOCATION:  2301                         FACILITY:  [facility]  PHYSICIAN:  Rigoberto Noel, MD      DATE OF BIRTH:  Apr 30, 1941  DATE OF ADMISSION:  12/14/2010 DATE OF DISCHARGE:                             HISTORY & PHYSICAL   PRIMARY CARE PHYSICIAN:  Mylinda Latina, MD from [facility].  CARDIOLOGIST:  Ezzard Standing, MD  REASON FOR ADMISSION:  Acute respiratory failure.  HISTORY OF PRESENT ILLNESS:  We are asked to resume care for Ms. Kristen [name] who is now intubated after arriving in [place] to the emergency room.  She is a 70 year old woman.  History was obtained after reviewing her medical record.  Apparently, she underwent cardioversion on November 25, 2010, by Dr. Wynonia Lawman for persistent symptomatic atrial fibrillation.  He was admitted prior to that from September 03, 2010, to September 08, 2010, for new onset atrial fibrillation and echo at that time showed an EF of 60-65% with mild-to-moderate mitral regurgitation and normal left atrial size.  She apparently presented to Urgent Care today with the acute onset shortness of breath and was sent to the emergency room.  On arrival, she was requiring about 6-8 L of nasal cannula and a chest x-ray 11:30 p.m. was consistent with interstitial edema.  A bag of normal saline was hanging on my arrival, about 750 mL of fluid had been administered.  She developed worsening respiratory distress requiring endotracheal intubation and mechanical ventilation and a followup chest x-ray at 1:19 a.m. showed extensive bilateral airspace opacities consistent with acute pulmonary edema and an endotracheal tube of about 3.4 cm above the carina.  Cardiomegaly was also noted.  A positive D-dimer a CT angiogram was obtained, which was consistent with worsening without pulmonary edema due to small effusions,  right greater than left.  Drug reaction, atypical pneumonia was considered less likely given the rapid progression of infiltrates. Apparently, Dr. Radford Pax from Cardiology has been notified.  We are asked to resume further care at this point.  PAST MEDICAL HISTORY: 1. Atrial fibrillation as described above, maintained on metoprolol     and Pradaxa, last cardioversion on November 25, 2010. 2. Hypertension. 3. She has a history of transverse myelitis in 1960. 4. 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  PAST SURGICAL HISTORY:  Diverticulitis most recently in October 2011, meniscal repair left knee in March 2007, bunionectomy in September 2008.  CURRENT MEDICATIONS: 1. Diazepam 5 mg nightly. 2. Acyclovir 200 mg 1 tablet 3 times a day. 3. Zestoretic 20/25 mg 1 tablet p.o. daily. 4. Pradaxa 150 mg p.o. b.i.d. 5. Metoprolol 50 mg p.o. b.i.d. 6. Calcium 1500 mg 1 tablet p.o. b.i.d. 7. Lisinopril hydrochlorothiazide. 8. B-complex. 9. Vitamin C.  ALLERGIES:  None known.  SOCIAL HISTORY:  She has a rather checkered history.  She has been married four times, and her current husband is a apparently 33 years old.  She is a Chief Technology Officer.  Denies smoking, and there is no history of smoking.  Drinks  2-3 cups of coffee per day.  She has 15 cats at home.  REVIEW OF SYSTEMS:  Unobtainable at the current time.  PHYSICAL EXAMINATION:  GENERAL:  Elderly woman, appears her stated age, and orally intubated, sedated. VITAL SIGNS:  Heart rate 84 per minute, sinus on monitor, oxygen saturation 92% and 100% PEEP of 5, respirations 18 per minute, blood pressure 142/88. HEENT:  No pallor.  No icterus. NECK:  Supple.  No JVD.  No lymphadenopathy. CVS:  S1 and S2, normal ejection systolic and pansystolic murmur 2/6 at the apex. CHEST:  Bilateral scattered crackles. ABDOMEN:  Soft and nontender. NEUROLOGIC:  Sedated.  Unresponsive. EXTREMITIES:  No edema.  LABORATORY DATA:  BNP  level was 144.  D-dimer 0.52.  Troponin less than 0.03, myoglobin 83.  Sodium 137, potassium 3.8 chloride 101, bicarbonate 24, BUN and creatinine 17 and 0.68.  Calcium 9.1.  WBC 10.5, hemoglobin 13.1, platelets 265.  IMAGING STUDIES:  As described above.  EKG shows sinus bradycardia with first-degree AV block, T-wave inversions in V1 and V2.  IMPRESSION: 1. Favor acute pulmonary edema given clinical presentation and rapid     progression of infiltrates, also pleural effusions are given favor     from cardiogenic cause.  The low BNP can be misleading or may be     falsely negative due to obesity.  The differential diagnosis of     course includes noncardiogenic pulmonary edema, but the absence of     leukocytosis, fevers, preceding history makes pneumonic process     less likely. 2. Moderate mitral regurgitation. 3. Rule out fresh ischemia.  RECOMMENDATIONS:  1.  Serial cardiac enzymes will be obtained.  She will be diuresed with Lasix.  An echo will be obtained in a.m. for worsening mitral regurgitations.  Dr. Wynonia Lawman will be consulted in the morning.  A urine drug screen will be obtained given her checkered social history.  2. Ventilator settings will be PRVC 500, 100% PEEP of 5, FiO2 will be decreased per diuretics after diuresis; if we are unable to, PEEP can be gradually applied.  3.  IV propofol will be used for sedation and fentanyl for breakthrough.   4. Ceftriaxone and azithromycin will be used to treat empirically for community-acquired pneumonia.  Urine strep antigen and procalcitonin level will be obtained.  If these are normal, antibiotics can be discontinued especially if infiltrates continue to improve.   5. Subcu heparin will be used for DVT prophylaxis and IV Protonix for GI prophylaxis.  Once extubated, she can be transferred to the [facility].   Total critical care time was 60 minutes.     Rigoberto Noel, MD     RVA/MEDQ  D:   12/15/2010  T:  12/15/2010  Job:  [id number]  cc:   Mylinda Latina, MD Ezzard Standing, M.D.  Electronically Signed by Kara Mead MD on 12/16/2010 12:01:00 PM

## 2010-12-20 NOTE — Discharge Summary (Signed)
Kristen [name], FULL NO.:  [id number]  MEDICAL RECORD NO.:  [id number]           PATIENT TYPE:  I  LOCATION:  [id number]                         FACILITY:  [facility]  PHYSICIAN:  Jerline Pain, MD      DATE OF BIRTH:  11/28/1940  DATE OF ADMISSION:  12/14/2010 DATE OF DISCHARGE:  12/19/2010                              DISCHARGE SUMMARY   CARDIOLOGIST:  Ezzard Standing, MD.  PRIMARY CARE PHYSICIAN:  Mylinda Latina, MD of [facility].  FINAL DIAGNOSES: 1. Acute respiratory failure. 2. Community-acquired pneumonia. 3. Atrial fibrillation, paroxysmal. 4. Chronic anticoagulation. 5. Anxiety. 6. Hypertension. 7. History of transverse myelitis in 1960. 8. 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  HOME MEDICATIONS: 1. Diazepam 5 mg at night. 2. Acyclovir 200 mg 1 tablet 3 times a day. 3. Lisinopril/hydrochlorothiazide 20/25 mg once a day. 4. Pradaxa 150 mg twice a day. 5. Metoprolol 50 mg twice a day. 6. Calcium, B complex vitamin, vitamin C.  No known drug allergies are known.  HOSPITAL COURSE:  A 70 year old female, who lives alone with several cats, who was admitted with acute respiratory failure, who arrived intubated in the emergency room.  Pulmonary Critical Care managed her initial care, and she was subsequently extubated.  CT angiogram of the chest showed pulmonary edema, small effusions, but no evidence of pulmonary emboli.  Her EKG interestingly remained in sinus rhythm, but demonstrated deep T- wave inversions as well as QT prolongation in multiple leads, especially precordial leads.  Because of this finding, Dr. Tollie Eth, her cardiologist took her to the Cardiac Catheterization Lab on December 17, 2010.  Her coronary arteries were normal with normal left ventricular function.  Because of the EKG abnormalities, flecainide was not restarted.  Her rhythm maintained sinus.  She was resumed on Pradaxa on day 2  following cardiac catheterization.  Respiratory cultures were obtained, which showed coag-negative staph, likely contaminant.  Strep pneumo from urine was negative.  LAB WORK:  On discharge, sodium 137, potassium 3.8, BUN 12, creatinine 0.7.  White count 9.7, hemoglobin 12.2, hematocrit 37.4, platelet count 289.  BNP was 159 on December 17, 2010.  DISCHARGE MEDICATIONS:  As described above including community-acquired pneumonia coverage as recommended by Pulmonary Medicine.  She will be discharged on azithromycin 250 mg p.o. for 5 days as well as cefuroxime 500 mg b.i.d. for 5 days.  She has no known drug allergies.  Discharge instructions were explained to the patient.  She did ask me if litter from 12 different cats could produce toxic air, and she stated that she would wear a mask while doing this activity.  I stated that this would be reasonable.  She knows to contact Dr. Thurman Coyer office for followup and she is also to follow up with her primary care physician. She is to continue with Pradaxa for paroxysmal atrial fibrillation. Once again, she is off her flecainide, but she continues to maintain her metoprolol.  EKG; T-wave inversions did gradually become improved throughout her hospitalization.  Echocardiogram done originally on hospitalization showed an EF of 45-50% with some mid-to-distal anteroseptal hypokinesis.  During  cardiac catheterization, however, the LV appeared normal.  Possible etiology behind this presentation could have been a stress- induced cardiomyopathy type picture or perhaps community-acquired pneumonia at this point, unknown.  Her EKG abnormalities could also have been from the stress involved with this situation or metabolic derangement.  DISCHARGE TIME:  Thirty-five minutes spent with the patient, med reconciliation instruction, and review of medical records.     Jerline Pain, MD     MCS/MEDQ  D:  12/19/2010  T:  12/20/2010  Job:   [id number]  cc:   Ezzard Standing, M.D. Mylinda Latina, MD  Electronically Signed by [name] MD on 12/20/2010 06:37:14 AM

## 2010-12-21 LAB — CULTURE, BLOOD (ROUTINE X 2)

## 2010-12-22 ENCOUNTER — Other Ambulatory Visit: Payer: Self-pay | Admitting: Family Medicine

## 2010-12-22 NOTE — Telephone Encounter (Signed)
Please review and refill

## 2010-12-23 ENCOUNTER — Encounter: Payer: Self-pay | Admitting: Pulmonary Disease

## 2010-12-23 NOTE — Procedures (Signed)
NAMESIDNEI, WORMALD              ACCOUNT NO.:  [id number]  MEDICAL RECORD NO.:  [id number]           PATIENT TYPE:  I  LOCATION:  [id number]                         FACILITY:  [facility]  PHYSICIAN:  Ezzard Standing, M.D.DATE OF BIRTH:  1941/01/03  DATE OF PROCEDURE:  12/17/2010                            CARDIAC CATHETERIZATION   HISTORY:  A 70 year old female with a previous history of atrial fibrillation who has been on flecainide and Pradaxa.  She presented with worsening neck pain with exertion and then developed acute respiratory distress, was intubated with what was thought to be pulmonary edema although her BNP level was not initially that high.  She improved with diuresis, antibiotics and ventilation and was extubated.  Minimal elevation of troponins were noted and she developed significant T-wave changes in the anterior leads anteriorly.  Echocardiogram showed mild anterior hypokinesis and septal hypokinesis and catheterization is advised.  PROCEDURE:  Left heart catheterization with coronary angiograms and left ventriculogram.  COMMENTS ABOUT PROCEDURE:  The patient tolerated the procedure well without complications.  She was brought to the cath lab and prepped and draped in the usual manner.  After Xylocaine anesthesia, a 5-French sheath was placed in the right femoral artery percutaneously with a single anterior needle wall stick.  Versed 2 mg was used for sedation. Angiograms were made using 5-French catheters and a 30 mL ventriculogram was performed.  Following the procedure, the films were reviewed and she was taken to the holding area for sheath removal.  HEMODYNAMIC DATA:  The aorta postcontrast 137/64, LV postcontrast 137/4- 15.  ANGIOGRAPHIC DATA:  Left ventriculogram:  Performed in the 30-degree RAO projection.  The aortic valve is normal.  The mitral valve appears normal.  There was minimal mitral regurgitation noted.  The left ventricle was normal in size and  appeared to have a normal wall motion throughout.  Estimated ejection fraction is 60%.  Coronary arteries arise and distribute normally.  There is no coronary calcification noted.  The left main coronary artery is normal.  The left anterior descending extends to apex and has a large diagonal branch.  There is no significant obstructive stenoses noted.  The circumflex coronary artery has 2 marginal branches and appears free of disease.  There appears to be intramyocardial vessel that arises that may reflect some intracardiac collaterals.  The right coronary artery is a dominant vessel containing no significant stenoses.  IMPRESSION: 1. No significant coronary artery disease identified. 2. Normal left ventricular function.  RECOMMENDATIONS:  At the present time, the patient has good LV function and no significant obstructive coronary artery disease is seen.  The etiology of her clinical presentation is unclear.  Her BNP level was not severely elevated and she did have rapidly clear infiltrates.  Blood culture is pending.  We will continue to treat her with antibiotics pending the outcome of the blood cultures and pulmonary, we will continue to follow her.  Resume Pradaxa.  I am going to keep her off of flecainide for the time being.     Ezzard Standing, M.D.     WST/MEDQ  D:  12/17/2010  T:  12/18/2010  Job:  [id number]  cc:   [facility]  Electronically Signed by W. Wynonia Lawman M.D. on 12/23/2010 09:33:28 AM

## 2010-12-23 NOTE — Consult Note (Signed)
Kristen [name], Kristen [name]              ACCOUNT NO.:  [id number]  MEDICAL RECORD NO.:  [id number]           PATIENT TYPE:  I  LOCATION:  2301                         FACILITY:  [facility]  PHYSICIAN:  Ezzard Standing, M.D.DATE OF BIRTH:  1941/10/22  DATE OF CONSULTATION:  12/15/2010                                 CONSULTATION   I was asked to see this 70 year old female for cardiac consultation. The patient has a history of being screened at [facility] in September and was found to have sinus bradycardia.  She presented to the hospital with atrial fibrillation in October.  At that point in time, she had some mild abdominal pain and irregular pulse and was found to be in rapid atrial fibrillation.  She was hospitalized and placed on Pradaxa during that admission.  An echocardiogram showed mild-to- moderate mitral regurgitation.  She was discharged on Pradaxa and metoprolol and after an extensive evaluation was placed on low-doseflecainide as an outpatient and underwent cardioversion on January 19. She was bradycardic following cardioversion and I reduced her metoprolol.  She reduced her flecainide and was back in atrial fibrillation when I saw her on January 27.  At that point in time, I increased her flecainide back to 50 mg twice daily (she had reduced at 50 mg once daily and then asked her to increase it to 100 mg twice daily).  She was feeling fine otherwise and we discussed whether or not to try to put her back into sinus rhythm.  Since increasing her flecainide to a higher dose, she had noted some episodic tightness in her throat which would occur with walking or with bending over.  She would have a feeling as if there would a hissing sensation in her ears and would be relieved with rest.  She did not notice her heart jumping around as much the past several days.  Yesterday she called with some complaints of chest tightness and was becoming more dyspneic and the night before  was unable to complete her class in dancing that she normally did.  She called the office shortly before we were to close yesterday and was advised to go to the emergency room.  The patient went to an Urgent [facility] instead, but then was advised to go to the emergency room from there and transported by ambulance.  She was given some fluids and eventually had to be intubated because of worsening pulmonary edema and heart failure and is now extubated this morning. Cardiac enzymes thus far have been negative.  The D-dimer was mildly elevated.  BNP was only 144.  Up until the past couple of days, she had really felt fine and denied dyspnea until the night before last.  PAST MEDICAL HISTORY:  Remarkable for hypertension, diverticulitis, obesity, and a history of transverse myelitis 25 years ago.  PREVIOUS SURGERY:  Breast lumpectomy, bunion surgery, knee surgery on the left, eyelid surgery.  ALLERGIES:  None known.  MEDICATIONS PRIOR TO ADMISSION:  Metoprolol 50 b.i.d., Pradaxa 150 b.i.d., lisinopril/HCTZ 20/25 daily, flecainide 100 mg b.i.d.  SOCIAL HISTORY:  She has been married 4 times.  She currently  lives with her husband.  She never smoked.  She does belly dancing, uses alcohol socially.  FAMILY HISTORY:  Father died at age 34 of renal failure.  Mother died of ovarian cancer at age 59.  Uncle died at age 94 of an MI.  REVIEW OF SYSTEMS:  Has been mildly obese.  She complains of dyspnea. No recent eye, ear, nose, or throat problems.  No hemoptysis.  No recent upper respiratory infection.  She does have a previous history of diverticulitis.  She has no significant urinary symptoms but does have a history of frequent UTIs.  No history of stroke or TIA, occasional mild arthritis involving her knee.  Other than as noted above, remainder of systems is unremarkable.  PHYSICAL EXAMINATION:  GENERAL:  She is a somewhat anxious-appearing mildly obese female in no acute  distress. VITAL SIGNS:  She is currently in sinus rhythm with a pulse of 68, blood pressure is 100/66, pulse currently 68. SKIN:  Warm and dry, somewhat pale. ENT:  EOMI.  PERRLA.  CNS clear.  Fundi not examined.  Pharynx is negative. NECK:  Supple without masses, no JVD, thyromegaly or bruits. LUNGS:  Clear to A and P. CARDIAC:  Regular rhythm, normal S1, S2.  No S3. ABDOMEN:  Soft and nontender.  Distal pulses present are 2+.  A 12-lead EKG shows sinus bradycardia with T-wave inversions noted in V2- V4, QT prolongation.  Chest x-ray reviewed showed which appears to be interstitial pulmonary edema.  Lab data shows a white count of 12,900, glucose is 173.  Sodium is 138, potassium 3.2, glucose 165, BUN 15, creatinine 1.04.  Troponin is 0.07.  IMPRESSION: 1. Pulmonary edema, questionable cardiac, questionable ischemic,     questionable related to medications. 2. Recent atrial fibrillation, currently in sinus rhythm on higher     dose flecainide 3. Hypokalemia. 4. Recent neck pain with T-wave inversion anteriorly, questionable     ischemia. 5. History of mild-to-moderate mitral regurgitation.  RECOMMENDATIONS:  Check echocardiogram at this time.  At the present time, I would hold her flecainide and I believe with the recent neck pain, she will need to go on heparin but we will hold her Pradaxa for 2 days and then maybe consider cardiac catheterization on her.  She is currently in sinus rhythm.     Ezzard Standing, M.D.     WST/MEDQ  D:  12/15/2010  T:  12/15/2010  Job:  [id number]  cc:   [facility] Rigoberto Noel, MD  Electronically Signed by Viona Gilmore. Wynonia Lawman M.D. on 12/23/2010 09:33:15 AM

## 2010-12-29 ENCOUNTER — Telehealth: Payer: Self-pay | Admitting: Family Medicine

## 2010-12-29 NOTE — Telephone Encounter (Signed)
Will forward to PCP 

## 2010-12-29 NOTE — Telephone Encounter (Signed)
Pt was taking diazepam & was told it wasn't a good option for seniors, wants MD to prescribe an alternative. Pt goes to [address].

## 2010-12-30 NOTE — Telephone Encounter (Signed)
Pt needs office visit to discuss

## 2010-12-31 NOTE — Telephone Encounter (Signed)
Pt scheduled an appt °

## 2010-12-31 NOTE — Telephone Encounter (Signed)
Left vm, will scheduled appt when pt calls back

## 2011-01-03 ENCOUNTER — Ambulatory Visit (HOSPITAL_COMMUNITY)
Admission: RE | Admit: 2011-01-03 | Discharge: 2011-01-03 | Disposition: A | Discharge status: Home / Self Care | Payer: MEDICARE | Source: Ambulatory Visit | Attending: Family Medicine | Admitting: Family Medicine

## 2011-01-03 ENCOUNTER — Encounter (HOSPITAL_COMMUNITY): Payer: Self-pay

## 2011-01-03 DIAGNOSIS — Z1231 Encounter for screening mammogram for malignant neoplasm of breast: Secondary | ICD-10-CM | POA: Insufficient documentation

## 2011-01-06 ENCOUNTER — Encounter: Payer: Self-pay | Admitting: Home Health Services

## 2011-01-06 ENCOUNTER — Encounter (INDEPENDENT_AMBULATORY_CARE_PROVIDER_SITE_OTHER): Payer: MEDICARE | Admitting: Pulmonary Disease

## 2011-01-06 ENCOUNTER — Ambulatory Visit (INDEPENDENT_AMBULATORY_CARE_PROVIDER_SITE_OTHER): Payer: MEDICARE | Admitting: Home Health Services

## 2011-01-06 ENCOUNTER — Encounter: Payer: Self-pay | Admitting: Pulmonary Disease

## 2011-01-06 VITALS — BP 117/61 | HR 40 | Temp 98.3°F | Ht 62.5 in | Wt 189.0 lb

## 2011-01-06 DIAGNOSIS — Z0000 Encounter for general adult medical examination without abnormal findings: Secondary | ICD-10-CM

## 2011-01-06 DIAGNOSIS — J9600 Acute respiratory failure, unspecified whether with hypoxia or hypercapnia: Secondary | ICD-10-CM

## 2011-01-06 NOTE — Patient Instructions (Signed)
1. Start dancing again- 3 times a week. 2. Eat more vegetables 3 times a day 3. Lower your LDL- avoid foods with saturated fats.

## 2011-01-06 NOTE — Progress Notes (Signed)
Patient here for annual wellness visit, patient reports: Risk Factors/Conditions needing evaluation or treatment: Patient had a low heart beat of 40 bpm.  She is under watch with her Cardiologist. Diet: Patient has a varied diet of protein, starch, and dairy.  She would like to love 60 pounds and stated that she is aware of how to lose the weight by calorie reduction. Physical Activity: Patient reports normally dancing several times a week.  However since she left the hospital 3 weeks ago she has not started dancing again. Home Safety: Patient lives in 2 story home with her husband.  She reports have smoke detectors and adaptive equipment in the bathrooms.  End of Life Plan: Patient does not have her will in place or advance directives.  Gave patient the Advance Directives booklet and recommended she complete it.  Patient identified her friend, Margarita Mail as her emergency contact at [phone] Other Information: Patient wears glasses and visit's eye doctor at least 1 x a year.  Patient has dentures on bottom and partial on top. 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     Balance max value patientvalue  Sitting balance 1 1  Arise 2 2  Attempts to arise 2 2  Immediate standing balance 2 2  Standing balance 1 1  Nudge 2 2  Eyes closed 1 1  360 degree turn 1 1  Sitting down 2 2   Gait max value patient value  Initiation of gait 1 1  Step length-left 1 1  Step length-right 1 1  Step height-left 1 1  Step height-right 1 1  Step symmetry 1 1  Step continuity 1 1  Path 2 2  Trunk 2 2  Walking stance 1 1   Balance/Gait Score: 26/26  Mental Status Exam max value patient value  Orientation to time 5 5  Orientation to place 5 5  Registration 3 3  Attention 5 5  Recall 3 3  Language (name 2 objects) 2 2  Language-repeat 1 1  Language-follow 3 step command 3 3  Language-read and follow directions 1 1   Mental Status Exam: 63/28   Annual Wellness Visit  Requirements Recorded Today In  Medical, family, social history Past Medical, Family, Social History Section  Current providers Care team  Current medications Medications  Wt, BP, Ht, BMI Vital signs  Tobacco, alcohol, illicit drug use History  ADL Nurse Assessment  Depression Screening Nurse Assessment  Cognitive impairment Nurse Assessment  Fall Risk Nurse Assessment  Home Safety Progress Note  End of Life Planning (welcome visit) Progress Note  Medicare preventative services Progress Note  Risk factors/conditions needing evaluation/treatment Progress Note  Personalized health advice Patient Instructions, goals, letter    Prevention Plan: Patient is up to date on all prevention screenings with the exception of the zostavax vaccine.  Gave patient pamphlet and recommeded getting the shot.  Recommended Medicare Prevention Screenings Women over 57 Test For Frequency Date of Last- BOLD if needed  Breast Cancer 1-2 yrs 12/2009  Cervical Cancer 1-3 yrs 12/2009  Colorectal Cancer 1-10 yrs 03/2006  Osteoporosis once 01/2008  Cholesterol 5 yrs 01/2008  Diabetes yearly Non diabetic  HIV yearly Patient declined  Influenza Shot yearly 09/2010 Patient reported  Pneumonia Shot once 12/2010 Patient reported while in hospital 12/2010  Zostavax Shot once Gave patient information

## 2011-01-10 ENCOUNTER — Ambulatory Visit (INDEPENDENT_AMBULATORY_CARE_PROVIDER_SITE_OTHER): Payer: MEDICARE | Admitting: Family Medicine

## 2011-01-10 ENCOUNTER — Telehealth: Payer: Self-pay | Admitting: Family Medicine

## 2011-01-10 ENCOUNTER — Encounter: Payer: Self-pay | Admitting: Family Medicine

## 2011-01-10 VITALS — BP 168/93 | HR 45 | Temp 98.2°F | Ht 62.75 in | Wt 190.0 lb

## 2011-01-10 DIAGNOSIS — R001 Bradycardia, unspecified: Secondary | ICD-10-CM

## 2011-01-10 DIAGNOSIS — G4700 Insomnia, unspecified: Secondary | ICD-10-CM

## 2011-01-10 DIAGNOSIS — M899 Disorder of bone, unspecified: Secondary | ICD-10-CM

## 2011-01-10 DIAGNOSIS — F411 Generalized anxiety disorder: Secondary | ICD-10-CM

## 2011-01-10 DIAGNOSIS — M949 Disorder of cartilage, unspecified: Secondary | ICD-10-CM

## 2011-01-10 DIAGNOSIS — I498 Other specified cardiac arrhythmias: Secondary | ICD-10-CM

## 2011-01-10 MED ORDER — ALENDRONATE SODIUM 10 MG PO TABS: 10.0000 mg | ORAL_TABLET | Freq: Every day | ORAL | Status: DC

## 2011-01-10 MED ORDER — DIAZEPAM 5 MG PO TABS: 5.0000 mg | ORAL_TABLET | Freq: Every day | ORAL | Status: DC

## 2011-01-10 MED ORDER — ALENDRONATE SODIUM 10 MG PO TABS: 10.0000 mg | ORAL_TABLET | ORAL | Status: AC

## 2011-01-10 MED ORDER — METOPROLOL TARTRATE 50 MG PO TABS: 25.0000 mg | ORAL_TABLET | Freq: Two times a day (BID) | ORAL | Status: DC

## 2011-01-10 NOTE — Patient Instructions (Signed)
We are going to decrease the Metoprolol to 1/2 tab twice a day, this should help with your slow heart rate You need to schedule an appointment with Dr. Wynonia Lawman to discuss your slow heart rate Please schedule a follow up appointment in 1 week to check your heart rate We also started [facility] for your bones

## 2011-01-10 NOTE — Assessment & Plan Note (Signed)
Since at least September.  She has been asymptomatic from this.  Was told to follow up with Cardiology.  Advised her to go and see Dr. Wynonia Lawman as soon as possible.  Will decrease Metoprolol to 25mg  bid.  May not need this at all.  RTC in 1 week to recheck HR.

## 2011-01-10 NOTE — Progress Notes (Signed)
  Subjective:    Patient ID: Kristen [name], female    DOB: Oct 12, 1941, 70 y.o.   MRN: [id number]  HPI 1. Slow heart rate:  She has had this since September.  Was told to see a Cardiologist for this but she refused to go because she wasn't having any symptoms.  She has been on Metoprolol for a. Fib and has been taking it as prescribed.  ROS: Denies shortness of breath, chest pain, dizziness, light-headedness, fatigue  2. Insomnia: Having trouble falling asleep.  Was on Valium for this.  Would like to continue.  ROS: denies racing thoughts  3. Osteopenia:  Is taking Ca +VitD.  Would like to start something.  ROS: denies recent fractures  4. A.Fib: Was in the hospital for shortness of breath.  Thought to be likely related to an allergy to [facility].  This was stopped and she hasn't had any problems since then.  She did have a Cath while in the hospital and it was normal  5. Hearing loss:  Went to an Nurse, children's and was told that she had cerumen impaction.   Review of Systems     Objective:   Physical Exam  Constitutional: No distress.       Obese   HENT:       Right ear with impacted cerumen.  Left ear normal.  Eyes: Conjunctivae are normal.  Neck: Normal range of motion. Neck supple. No JVD present.  Cardiovascular:       Bradycardic.  Regular rhythm.  No murmur appreciated.  Pulmonary/Chest: Effort normal and breath sounds normal. No respiratory distress. She has no wheezes. She has no rales.  Abdominal: Soft. She exhibits no distension.  Musculoskeletal: She exhibits no edema.  Neurological: She displays normal reflexes. No cranial nerve deficit. She exhibits normal muscle tone. Coordination normal.  Skin: Skin is warm and dry. She is not diaphoretic.          Assessment & Plan:

## 2011-01-10 NOTE — Telephone Encounter (Signed)
Pharmacy need correction on Fosamax 10 mg . MD had sent in 10 mg daily and he states it needs to be once a week. Pharmacy notified.

## 2011-01-10 NOTE — Assessment & Plan Note (Signed)
Will start Boniva.  Recheck DEXA scan in 6-12 months

## 2011-01-10 NOTE — Telephone Encounter (Signed)
Received electronic rx, needs to clarify directions

## 2011-01-10 NOTE — Assessment & Plan Note (Signed)
She has been taking Valium for this.  She would like to continue taking it because she can't sleep without it.

## 2011-01-10 NOTE — Assessment & Plan Note (Signed)
Does not endorse anxiety symptoms.

## 2011-01-10 NOTE — Progress Notes (Signed)
Addended by: Mylinda Latina on: 01/10/2011 12:47 PM   Modules accepted: Orders

## 2011-01-13 NOTE — Assessment & Plan Note (Signed)
Summary: HFU- PNA PER KATHY W/  DR Serita Grit.  TILLEY///kp   Visit Type:  Hospital Follow-up Primary Provider/Referring Provider:  Mylinda Latina MD  CC:  Pt here for hospital follow up. Marland Kitchen  History of Present Illness: 70/F, who lives alone with several  cats, who was admitted with acute respiratory failure from 2/7-12/12  CT angiogram of the chest showed pulmonary edema, small effusions, but no evidence of pulmonary emboli. Her EKG  demonstrated deep T- wave inversions as well as QT prolongation in multiple leads, especially precordial leads.  C. cath >>coronary arteries were normal with normal left ventricular function. Because of the EKG abnormalities, flecainide was not restarted.  Her rhythm maintained sinus.  She was resumed on Pradaxa on day 2 following cardiac catheterization.  Respiratory cultures were obtained, which showed coag-negative staph, likely contaminant.  Strep pneumo from urine was negative. She is convinced that flecainide caused her probs. back to baseline now c/o dry cough x  2 weeks, abck on lisinopril  Preventive Screening-Counseling & Management  Alcohol-Tobacco     Smoking Status: never  Current Medications (verified): 1)  Diazepam 5 Mg Tabs (Diazepam) .... Take 1 Tablet By Mouth At Bedtime 2)  Acyclovir 200 Mg  Caps (Acyclovir) .Marland Kitchen.. 1 By Mouth Three Times A Day As Needed 3)  Pradaxa 150 Mg Caps (Dabigatran Etexilate Mesylate) .Marland Kitchen.. 1 Tab By Mouth Bid 4)  Metoprolol Tartrate 50 Mg Tabs (Metoprolol Tartrate) .Marland Kitchen.. 1 Tab By Mouth Twice A Day 5)  Calcium 1500 Mg Tabs (Calcium Carbonate) .Marland Kitchen.. 1 Tab By Mouth Daily 6)  Lisinopril-Hydrochlorothiazide 20-25 Mg Tabs (Lisinopril-Hydrochlorothiazide) .Marland Kitchen.. 1 Tab By Mouth Daily 7)  B Complex  Tabs (B Complex Vitamins) .Marland Kitchen.. 1 Tab By Mouth Daily 8)  Vitamin C 100 Mg Tabs (Ascorbic Acid) .Marland Kitchen.. 1 Tab By Mouth Daily 9)  Super Probiotic  Caps (Probiotic Product) .... Take 1 Capsule By Mouth Once A Day 10)  Flax Seed Oil 1000 Mg  Caps (Flaxseed (Linseed)) .... Take 1 Capsule By Mouth Once A Day 11)  Fish Oil 1000 Mg Caps (Omega-3 Fatty Acids) .... Take 1 Capsule By Mouth Once A Day 12)  Metamucil Smooth Texture 28.3 % Powd (Psyllium) .Marland Kitchen.. 1 Tablespoon Two Times A Day 13)  Acai Berry 500 Mg Caps (Acai) .... Take 1 Capsule By Mouth Once A Day  Allergies (verified): 1)  ! Flecainide Acetate (Flecainide Acetate)  Past History:  Social History: Last updated: 09/28/2009 drinks 2-3 cups coffee/day; no smoking ; Rare Etoh; Widowed from 3rd marriage.  1st lasted one year(age[age]); 15 cats.   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   exercises 2-3 x week - belly dancing.  mormon.   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  The patient denies non-productive cough, coughing up blood, chest pain, acid heartburn, indigestion, loss of appetite, weight change, abdominal pain, difficulty swallowing, sore throat, tooth/dental problems, headaches, nasal congestion/difficulty breathing through nose, sneezing, itching, ear ache, anxiety, depression, hand/feet swelling,  joint stiffness or pain, rash, change in color of mucus, and fever.    Vital Signs:  Patient profile:   70 year old female Height:      62.75 inches Weight:      191.6 pounds BMI:     34.34 O2 Sat:      95 % on Room air Temp:     98.0 degrees F oral Pulse rate:   46 / minute BP sitting:   124 / 78   (left arm) Cuff size:   regular  Vitals Entered By: Iran Planas CMA (January 06, 2011 3:45 PM)  O2 Flow:  Room air CC: Pt here for hospital follow up.  Comments Medications reviewed with patient Verified contact number and pharmacy with patient Iran Planas CMA  January 06, 2011 3:46 PM    Physical Exam  Additional Exam:  Gen. Pleasant, well-nourished, in no distress, normal affect ENT - no lesions, no post nasal drip Neck: No JVD, no thyromegaly, no carotid bruits Lungs: no use of accessory muscles, no dullness to percussion, clear without rales or rhonchi  Cardiovascular: Rhythm regular, heart sounds  normal, no murmurs or gallops, no peripheral edema Abdomen: soft and non-tender, no hepatosplenomegaly, BS normal. Musculoskeletal: No deformities, no cyanosis or clubbing Neuro:  alert, non focal     Impression & Recommendations:  Problem # 1:  ACUTE RESPIRATORY FAILURE (ICD-518.81)  -resolved Not likely to be pneumonia given rapid resolution of infiltrates & improvemnt off Abx Cough may be induced by lisinopril monitor weights No further recommendations at this time - she will call should symps recur  Orders: Est. Patient Level III [id number])  Medications Added to Medication List This Visit: 1)  Super Probiotic Caps (Probiotic product) .... Take 1 capsule by mouth once a day 2)  Flax Seed Oil 1000 Mg Caps (Flaxseed (linseed)) .... Take 1 capsule by mouth once a day 3)  Fish Oil 1000 Mg Caps (Omega-3 fatty acids) .... Take 1 capsule by mouth once a day 4)  Metamucil Smooth Texture 28.3 % Powd (Psyllium) .Marland Kitchen.. 1 tablespoon two times a day 5)  Acai Berry 500 Mg Caps (Acai) .... Take 1 capsule by mouth once a day  Patient Instructions: 1)  Copy sent to: dr Wynonia Lawman 2)  Please schedule a follow-up appointment as needed. 3)  Fluid has cleared from your lungs 4)  Weigh yourself at least once or twice a week 5)  Lisinopril can cause a dry cough

## 2011-01-18 ENCOUNTER — Encounter: Payer: Self-pay | Admitting: Home Health Services

## 2011-01-18 LAB — CBC
HCT: 38.5 % (ref 36.0–46.0)
Hemoglobin: 12.2 g/dL (ref 12.0–15.0)
MCV: 93.2 fL (ref 78.0–100.0)
RBC: 4.13 MIL/uL (ref 3.87–5.11)
WBC: 6.5 10*3/uL (ref 4.0–10.5)

## 2011-01-18 LAB — BASIC METABOLIC PANEL
BUN: 11 mg/dL (ref 6–23)
CO2: 30 mEq/L (ref 19–32)
Chloride: 102 mEq/L (ref 96–112)
Creatinine, Ser: 0.84 mg/dL (ref 0.4–1.2)

## 2011-01-18 NOTE — Progress Notes (Signed)
  Subjective:    Patient ID: Kristen [name], female    DOB: Feb 28, 1941, 70 y.o.   MRN: [id number]  HPI    Review of Systems     Objective:   Physical Exam        Assessment & Plan:

## 2011-01-18 NOTE — Letter (Signed)
Summary: Dr. Wynonia Lawman visit  Dr. Wynonia Lawman visit   Imported By: Jamelle Haring 01/14/2011 09:09:50  _____________________________________________________________________  External Attachment:    Type:   Image     Comment:   External Document

## 2011-01-18 NOTE — Progress Notes (Deleted)
  Subjective:    Patient ID: Kristen [name], female    DOB: 27-Sep-1941, 69 y.o.   MRN: [id number]  HPI    Review of Systems     Objective:   Physical Exam        Assessment & Plan:

## 2011-01-18 NOTE — Progress Notes (Signed)
I have reviewed this visit and discussed with Suzanne Lineberry and agree with her documentation  

## 2011-01-19 LAB — BASIC METABOLIC PANEL
BUN: 9 mg/dL (ref 6–23)
Chloride: 101 mEq/L (ref 96–112)
Glucose, Bld: 87 mg/dL (ref 70–99)
Potassium: 3.3 mEq/L — ABNORMAL LOW (ref 3.5–5.1)

## 2011-01-19 LAB — CARDIAC PANEL(CRET KIN+CKTOT+MB+TROPI)
CK, MB: 2.3 ng/mL (ref 0.3–4.0)
Relative Index: INVALID (ref 0.0–2.5)
Total CK: 80 U/L (ref 7–177)
Troponin I: 0.05 ng/mL (ref 0.00–0.06)

## 2011-01-19 LAB — CBC
HCT: 39.1 % (ref 36.0–46.0)
RDW: 14.1 % (ref 11.5–15.5)
WBC: 9 10*3/uL (ref 4.0–10.5)

## 2011-01-20 LAB — POCT URINALYSIS DIPSTICK
Bilirubin Urine: NEGATIVE
Nitrite: NEGATIVE
Protein, ur: NEGATIVE mg/dL
Urobilinogen, UA: 0.2 mg/dL (ref 0.0–1.0)
pH: 6.5 (ref 5.0–8.0)

## 2011-01-25 LAB — POCT I-STAT, CHEM 8
Calcium, Ion: 1.2 mmol/L (ref 1.12–1.32)
Chloride: 103 mEq/L (ref 96–112)
HCT: 39 % (ref 36.0–46.0)
Hemoglobin: 13.3 g/dL (ref 12.0–15.0)
TCO2: 34 mmol/L (ref 0–100)

## 2011-01-27 NOTE — Progress Notes (Signed)
This encounter was created in error - please disregard.

## 2011-02-13 LAB — URINE CULTURE
Colony Count: NO GROWTH
Culture: NO GROWTH

## 2011-02-13 LAB — POCT URINALYSIS DIP (DEVICE)
Glucose, UA: NEGATIVE mg/dL
Nitrite: NEGATIVE
Urobilinogen, UA: 0.2 mg/dL (ref 0.0–1.0)

## 2011-07-24 ENCOUNTER — Other Ambulatory Visit: Payer: Self-pay | Admitting: Family Medicine

## 2011-07-24 NOTE — Telephone Encounter (Signed)
Refill request

## 2011-07-25 NOTE — Telephone Encounter (Signed)
Rx for BP medication sent for 1 month. Patient needs to come in for appt for any more medications or refills.  Will you please notify patient please?  Thank you.

## 2011-08-19 ENCOUNTER — Ambulatory Visit (INDEPENDENT_AMBULATORY_CARE_PROVIDER_SITE_OTHER): Payer: Medicare Other | Admitting: Family Medicine

## 2011-08-19 ENCOUNTER — Encounter: Payer: Self-pay | Admitting: Family Medicine

## 2011-08-19 ENCOUNTER — Ambulatory Visit (HOSPITAL_COMMUNITY)
Admission: RE | Admit: 2011-08-19 | Discharge: 2011-08-19 | Disposition: A | Discharge status: Home / Self Care | Payer: Medicare Other | Source: Ambulatory Visit | Attending: Family Medicine | Admitting: Family Medicine

## 2011-08-19 ENCOUNTER — Other Ambulatory Visit: Payer: Self-pay

## 2011-08-19 VITALS — BP 117/71 | HR 96 | Temp 97.6°F | Wt 192.0 lb

## 2011-08-19 DIAGNOSIS — I4891 Unspecified atrial fibrillation: Secondary | ICD-10-CM

## 2011-08-19 DIAGNOSIS — I10 Essential (primary) hypertension: Secondary | ICD-10-CM

## 2011-08-19 DIAGNOSIS — R001 Bradycardia, unspecified: Secondary | ICD-10-CM

## 2011-08-19 DIAGNOSIS — F411 Generalized anxiety disorder: Secondary | ICD-10-CM

## 2011-08-19 DIAGNOSIS — M899 Disorder of bone, unspecified: Secondary | ICD-10-CM

## 2011-08-19 DIAGNOSIS — M949 Disorder of cartilage, unspecified: Secondary | ICD-10-CM

## 2011-08-19 DIAGNOSIS — I498 Other specified cardiac arrhythmias: Secondary | ICD-10-CM

## 2011-08-19 DIAGNOSIS — G4700 Insomnia, unspecified: Secondary | ICD-10-CM

## 2011-08-19 DIAGNOSIS — I499 Cardiac arrhythmia, unspecified: Secondary | ICD-10-CM

## 2011-08-19 MED ORDER — METOPROLOL TARTRATE 50 MG PO TABS: ORAL_TABLET | ORAL | Status: DC

## 2011-08-19 NOTE — Assessment & Plan Note (Signed)
Resolved

## 2011-08-19 NOTE — Assessment & Plan Note (Signed)
Controlled on current meds. But increasing metoprolol today due to AF.

## 2011-08-19 NOTE — Progress Notes (Signed)
  Subjective:    Patient ID: Kristen [name], female    DOB: January 10, 1941, 70 y.o.   MRN: [id number]  HPI This is a 70 YO Caucasian F with h/o HTN, AF, chronic back pain/osteopenia on alendronate, occasional insomnia controlled with occasional Valium. Here to meet me and for follow-up on medications.  1. Bradycardia, AF  Not bradycardic today Compliant with medications Does not think she is in AF  ROS: denies chest pain, heart palpitations  2. 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 Osteopenia Compliant with Ca/Vit D, mostly compliant with bisphosphonate although forgets occasionally (maybe once a month) Denies worsening back pain ROS: denies fevers, paresthesias, numbness  Review of Systems Per HPI    Objective:   Physical Exam Gen: NAD Psych: talkative and occasionally tangential but easily redirectable, not anxious or depressed appearing, though content normal,  CV: irregular rhythm, 2+ pedal and radial pulses Pulm: CTAB, no rales Abd: NABS, soft, NT, ND MSK: spine non-tender throughout Ext: warm, trace non-pitting edema  ECG: AF, HR 77, normal axis, normal intervals, no ST changes/TWI    Assessment & Plan:

## 2011-08-19 NOTE — Assessment & Plan Note (Signed)
Occasionally. Takes Valium infrequently for this which helps her more than "sleeping pills". It is okay for her to continue this as she is taking it now. 30 tablets lasts her about 6 months she tells me.

## 2011-08-19 NOTE — Assessment & Plan Note (Signed)
Mostly compliant with Boniva and Ca/Vit D. No nausea from Boniva. Will order repeat DEXA now.

## 2011-08-19 NOTE — Assessment & Plan Note (Signed)
Currently in AF without RVR. Asymptomatic.  Now no longer bradycardic, will increase metoprolol to 1 tablet once a day and 1/2 tablet once a day.  Following-up with cardiologist currently in December. Advised to move-up appointment to November if possible.

## 2011-08-19 NOTE — Patient Instructions (Signed)
For your irregular heart rhythm (atrial fibrillation), increase your metoprolol to 1 tablet in the morning and 1/2 tablet in the afternoon.  Follow-up with your cardiologist in November if possible.  We will schedule you for another bone scan. Continue to take your alendronate and calcium/vitamin D.   It was nice to meet you.  Follow-up with me in 6 months.

## 2011-08-23 ENCOUNTER — Other Ambulatory Visit: Payer: Medicare Other

## 2011-08-23 ENCOUNTER — Ambulatory Visit
Admission: RE | Admit: 2011-08-23 | Discharge: 2011-08-23 | Disposition: A | Discharge status: Home / Self Care | Payer: Medicare Other | Source: Ambulatory Visit | Attending: Family Medicine | Admitting: Family Medicine

## 2011-08-25 ENCOUNTER — Encounter: Payer: Self-pay | Admitting: Family Medicine

## 2011-08-27 ENCOUNTER — Other Ambulatory Visit: Payer: Self-pay | Admitting: Family Medicine

## 2011-08-29 NOTE — Telephone Encounter (Signed)
Refill request

## 2011-09-26 ENCOUNTER — Other Ambulatory Visit: Payer: Self-pay | Admitting: Family Medicine

## 2011-09-26 MED ORDER — LISINOPRIL-HYDROCHLOROTHIAZIDE 20-25 MG PO TABS: 1.0000 | ORAL_TABLET | Freq: Every day | ORAL | Status: DC

## 2011-09-28 ENCOUNTER — Encounter: Payer: Self-pay | Admitting: Family Medicine

## 2011-09-28 ENCOUNTER — Ambulatory Visit (INDEPENDENT_AMBULATORY_CARE_PROVIDER_SITE_OTHER): Payer: Medicare Other | Admitting: Family Medicine

## 2011-09-28 VITALS — BP 106/72 | HR 82 | Temp 98.0°F | Ht 62.5 in | Wt 191.2 lb

## 2011-09-28 DIAGNOSIS — M7989 Other specified soft tissue disorders: Secondary | ICD-10-CM | POA: Insufficient documentation

## 2011-09-28 NOTE — Progress Notes (Signed)
  Subjective:    Patient ID: Kristen [name], female    DOB: 1941-03-09, 70 y.o.   MRN: [id number]  HPI This is a 70 YO Caucasian F with h/o HTN, persistent AF on Pradaxa, chronic back pain/osteopenia on alendronate, occasional insomnia controlled with occasional Valium.  Presenting with right upper extremity swelling x 3 weeks. Put rubber bands around sleeves to keep sleeves up while working. Did this about 3 weeks ago and then started noticing swelling inferior and superior to her lateral popliteal space. Unchanged for 3 weeks now. Concerned about clot. Associated symptoms:    -moderate pain above these areas of swelling radiating up and down arm   -denies tingling/numbness   -denies weakness Alleviated by: nothing Aggravated by: palpation  Review of Systems Denies difficulty breathing. No previous history of blood clots.    Objective:   Physical Exam Gen: NAD Psych: talkative but pleasant, engaged, appropriate CV: irregularly irregular Pulm: CTAB, no w/r/r, no increased WOB, no chest tenderness Ext: no LE edema   Right arm: mild swelling inferior and superior to her lateral popliteal space; mild tenderness; no redness, warmth, or other abnormal skin findings   Left arm: does not have muscular masses in these areas as well but the right is definitely more pronounced    Assessment & Plan:

## 2011-09-28 NOTE — Progress Notes (Signed)
Addended by: Verdie Drown, Levada Dy J on: 09/28/2011 02:48 PM   Modules accepted: Orders

## 2011-09-28 NOTE — Assessment & Plan Note (Signed)
New. Started 3 weeks ago. Skin findings unremarkable but there does seem to be enlarged masses superior and inferior to lateral popliteal space but pronounced than left, although the left side also has muscular masses.  I think probably just some swelling but patient concerned about clot, so will rule-out with ultrasound today. Inciting event could be her having put rubber bands around her arms to keep her long sleeves up.  Patient has history of atrial fibrillation and is on Pradaxa, but this should not contribute to clot in upper extremity. Will check INR today.

## 2011-09-28 NOTE — Patient Instructions (Signed)
Please go over to the hospital to get the ultrasound. I will call you regarding your ultrasound results.

## 2011-09-30 ENCOUNTER — Ambulatory Visit (HOSPITAL_COMMUNITY)
Admission: RE | Admit: 2011-09-30 | Discharge: 2011-09-30 | Disposition: A | Discharge status: Home / Self Care | Payer: Medicare Other | Source: Ambulatory Visit | Attending: Family Medicine | Admitting: Family Medicine

## 2011-09-30 DIAGNOSIS — M7989 Other specified soft tissue disorders: Secondary | ICD-10-CM | POA: Insufficient documentation

## 2011-09-30 NOTE — Progress Notes (Signed)
*  PRELIMINARY RESULTS*  Limited right upper extremity venous duplex has been performed.  Negative for deep or superficial vein thrombosis of the right antecubital fossa area.  No other structural abnormally noted at the area of the lump.  Margarette Canada 09/30/2011, 3:28 PM

## 2011-11-03 ENCOUNTER — Telehealth: Payer: Self-pay | Admitting: Family Medicine

## 2011-11-03 NOTE — Telephone Encounter (Signed)
Kristen [name] would like to speak to Dr. Verdie Drown about what to do next about her right arm.  The scans did determine that it is not a blod clott and she wants to find out what to do next because it still hurts.

## 2011-11-09 NOTE — Telephone Encounter (Signed)
Patient is calling back after leaving a message last week.  The growth is still getting larger and gets more painful when she uses her right arm.

## 2011-11-10 NOTE — Telephone Encounter (Signed)
Symptoms concerning for epicondylitis (tennis elbow).   Called patient and left message that may be inflammation of tendon. Recommend arm band for tennis elbow that she can get at pharmacy to take pressure off of tendon. Also NSAIDs (ibuprofen 600 mg every 6 hours prn). Warned that may take weeks for improvement. But asked to make follow-up if not improved next 3-4 weeks. Consider referring to Sports Medicine or physical therapy at that time.

## 2011-11-26 ENCOUNTER — Encounter: Payer: Self-pay | Admitting: Family Medicine

## 2011-11-26 DIAGNOSIS — I4891 Unspecified atrial fibrillation: Secondary | ICD-10-CM

## 2011-11-26 NOTE — Assessment & Plan Note (Addendum)
Doc only from Dr. Thurman Coyer note (11/16/11). Stable (atrial fibrillation, HTN). Metoprolol 50 in AM, 25 qhs. Follow-up in 6 months.

## 2011-12-09 ENCOUNTER — Other Ambulatory Visit: Payer: Self-pay | Admitting: Cardiology

## 2011-12-24 ENCOUNTER — Other Ambulatory Visit: Payer: Self-pay | Admitting: Family Medicine

## 2011-12-24 NOTE — Telephone Encounter (Signed)
Refill request

## 2011-12-26 NOTE — Telephone Encounter (Signed)
Needs appointment before May

## 2012-01-06 IMAGING — CR DG CHEST 1V PORT
1 series · 1 of 1 positions shown · non-contrast
Comparison: Chest radiograph 08/16/2011

CLINICAL DATA: Congestive heart failure

PORTABLE CHEST - 1 VIEW

[view not recorded]
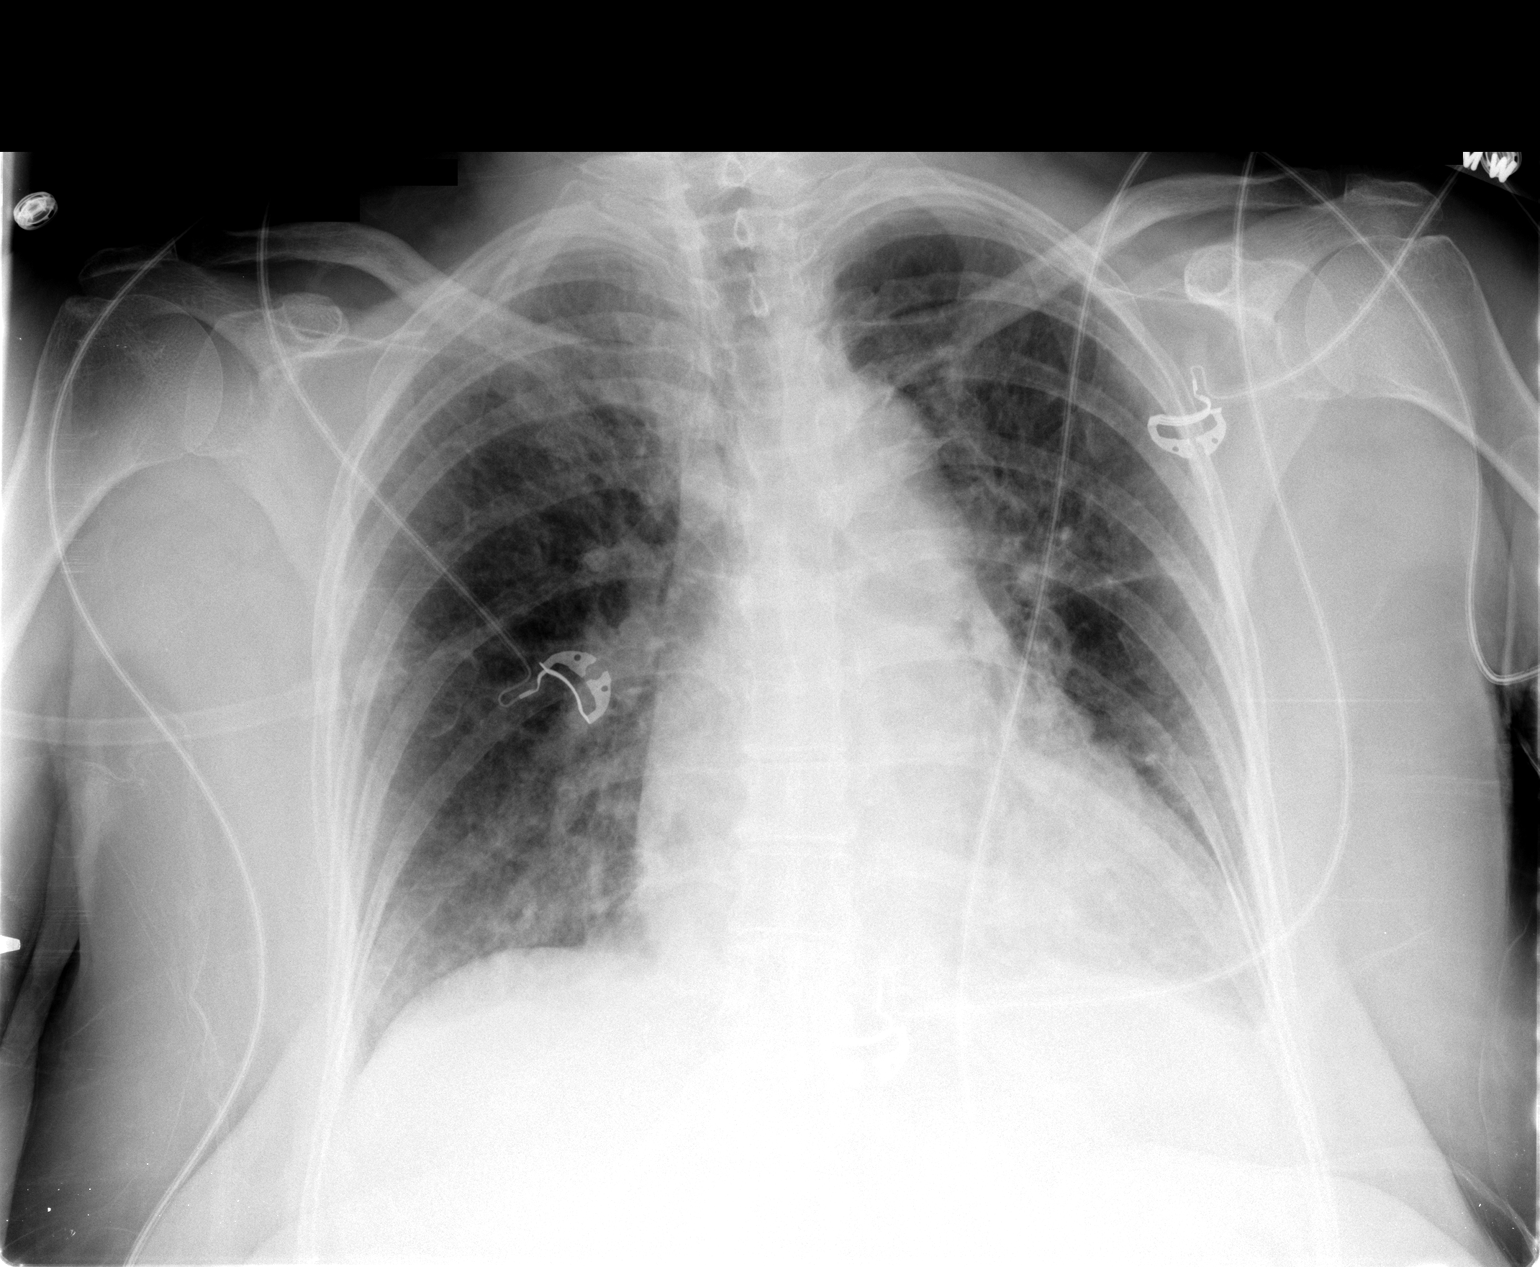

[1 of 1 positions shown; findings below may reference images not displayed]

FINDINGS: Stable enlarged heart silhouette.  There is coarsened
central bronchovascular markings.  The air space disease pattern in
the upper lobe appears slightly improved.  Trace left pleural
effusion.  No pneumothorax.
IMPRESSION: 1.  Improvement in air space disease in the upper lobes.
2.  Chronic bronchitic markings.
3.  Small left effusion.

## 2012-01-26 ENCOUNTER — Ambulatory Visit: Payer: Medicare Other | Admitting: Home Health Services

## 2012-01-31 ENCOUNTER — Encounter: Payer: Self-pay | Admitting: Home Health Services

## 2012-01-31 ENCOUNTER — Ambulatory Visit (INDEPENDENT_AMBULATORY_CARE_PROVIDER_SITE_OTHER): Payer: Medicare Other | Admitting: Home Health Services

## 2012-01-31 VITALS — BP 119/76 | HR 76 | Temp 98.0°F | Ht 62.5 in | Wt 199.3 lb

## 2012-01-31 DIAGNOSIS — Z0000 Encounter for general adult medical examination without abnormal findings: Secondary | ICD-10-CM

## 2012-01-31 NOTE — Patient Instructions (Addendum)
1. Continue to dance regularly. 2. Consider eating less sweets and focusing on eating more fruits and vegetables. 3. Take time for yourself to lower your stress. 4. Continue taking medications as prescribed daily. 5. Continue to be mindful about weight gain and working towards some weight loss.

## 2012-01-31 NOTE — Progress Notes (Signed)
I have reviewed this visit and discussed with Suzanne Lineberry and agree with her documentation  

## 2012-01-31 NOTE — Progress Notes (Signed)
Patient here for annual wellness visit, patient reports: Risk Factors/Conditions needing evaluation or treatment: Pt does not have any risk factors that need evaluation. Home Safety: Pt lives by self in mobile home.  Pt shares information that may indicate she has a hoarding problem.  Other Information: Corrective lens: Pt wears daily corrective lens.  Pt visits eye dr as needed. Dentures: Pt has full bottom dentures, partial upper.  Pt visits dentist as needed. Memory: Pt denies memory problems. Patient's Mini Mental Score (recorded in doc. flowsheet): 30  Balance/Gait: Pt reports her right knee as being weak and pops.  Balance Abnormal Patient value  Sitting balance    Sit to stand    Attempts to arise    Immediate standing balance    Standing balance    Nudge    Eyes closed- Romberg    Tandem stance    Back lean    Neck Rotation    360 degree turn    Sitting down     Gait Abnormal Patient value  Initiation of gait    Step length-left    Step length-right    Step height-left    Step height-right    Step symmetry    Step continuity    Path deviation    Trunk movement    Walking stance        Annual Wellness Visit Requirements Recorded Today In  Medical, family, social history Past Medical, Family, Social History Section  Current providers Care team  Current medications Medications  Wt, BP, Ht, BMI Vital signs  Tobacco, alcohol, illicit drug use History  ADL Nurse Assessment  Depression Screening Nurse Assessment  Cognitive impairment Nurse Assessment  Mini Mental Status Document Flowsheet  Fall Risk Nurse Assessment  Home Safety Progress Note  End of Life Planning (welcome visit) Social Documentation  Medicare preventative services Progress Note  Risk factors/conditions needing evaluation/treatment Progress Note  Personalized health advice Patient Instructions, goals, letter  Diet & Exercise Social Documentation  Emergency Contact Social Documentation  Seat  Belts Social Documentation  Sun exposure/protection Social Documentation    Prevention Plan:   Recommended Medicare Prevention Screenings Women over 65 Test For Frequency Date of Last- BOLD if needed  Breast Cancer 1-2 yrs 2/11; 2012  Cervical Cancer 1-3 yrs 2/11  Colorectal Cancer 1-10 yrs 5/07  Osteoporosis once 3/09  Cholesterol 5 yrs 3/09  Diabetes yearly 3/09  HIV yearly declined  Influenza Shot yearly 9/12  Pneumonia Shot once Reported done in hospital  Zostavax Shot once recommended

## 2012-02-01 ENCOUNTER — Ambulatory Visit: Payer: Medicare Other | Admitting: Home Health Services

## 2012-04-12 ENCOUNTER — Other Ambulatory Visit: Payer: Self-pay | Admitting: Family Medicine

## 2012-04-12 NOTE — Telephone Encounter (Signed)
Patient is calling for a refill on Lisinopril.  Her pharmacist said that she needs a new prescription.  She uses [facility] on [address].  She had accidentally been doubling up on Metoprolol because she had 2 bottles and thought one was Lisinopril.  She has no more Lisinopril left.

## 2012-04-12 NOTE — Telephone Encounter (Signed)
Appt with Charlett Blake tomorrow.  Pt states that she has bloating, dizziness and generally did not feel well for the few days she was doubling up.  Pt reluctant to appt but advised I would rather her be seen first. Keishana Klinger, [name]

## 2012-04-13 ENCOUNTER — Encounter: Payer: Self-pay | Admitting: Family Medicine

## 2012-04-13 ENCOUNTER — Ambulatory Visit (INDEPENDENT_AMBULATORY_CARE_PROVIDER_SITE_OTHER): Payer: Medicare Other | Admitting: Family Medicine

## 2012-04-13 VITALS — BP 122/82 | HR 60 | Temp 98.3°F | Ht 62.5 in | Wt 200.0 lb

## 2012-04-13 DIAGNOSIS — I10 Essential (primary) hypertension: Secondary | ICD-10-CM

## 2012-04-13 DIAGNOSIS — G4762 Sleep related leg cramps: Secondary | ICD-10-CM | POA: Insufficient documentation

## 2012-04-13 LAB — LIPID PANEL
Cholesterol: 206 mg/dL — ABNORMAL HIGH (ref 0–200)
HDL: 64 mg/dL (ref >39)
Total CHOL/HDL Ratio: 3.2 Ratio
Triglycerides: 127 mg/dL (ref <150)

## 2012-04-13 LAB — COMPREHENSIVE METABOLIC PANEL
Albumin: 3.9 g/dL (ref 3.5–5.2)
BUN: 12 mg/dL (ref 6–23)
Calcium: 9.6 mg/dL (ref 8.4–10.5)
Chloride: 102 mEq/L (ref 96–112)
Creat: 0.88 mg/dL (ref 0.50–1.10)
Glucose, Bld: 85 mg/dL (ref 70–99)
Potassium: 4.5 mEq/L (ref 3.5–5.3)

## 2012-04-13 LAB — CBC
HCT: 37.8 % (ref 36.0–46.0)
Hemoglobin: 12.6 g/dL (ref 12.0–15.0)
MCV: 89.6 fL (ref 78.0–100.0)
RDW: 14.3 % (ref 11.5–15.5)
WBC: 7.2 10*3/uL (ref 4.0–10.5)

## 2012-04-13 MED ORDER — LISINOPRIL-HYDROCHLOROTHIAZIDE 20-25 MG PO TABS: 1.0000 | ORAL_TABLET | Freq: Every day | ORAL | Status: DC

## 2012-04-13 NOTE — Progress Notes (Signed)
  Subjective:    Patient ID: Kristen [name], female    DOB: 1941/09/29, 71 y.o.   MRN: [id number]  HPI Hypertension-patient here to get refill of lisinopril HCTZ. She has been taking it but ran out today. One week ago, she spent several days taking double metoprolol and not the lisinopril HCTZ. At that time she felt poorly but then realized the problem and corrected it. Now she feels like her normal self. She denies chest pain, shortness of breath, dizziness.  Nocturnal leg cramps-patient has been having leg cramps the last several months. They are bilateral. She says she will have to get out of bed and check her legs. She says that she is having significantly less activity recently since she quit dancing. She also has had some knee pain which is an inhibitor or movement. She denies leg swelling or recent full inactivity. She is not having daytime pain.   Review of Systems    no nausea, vomiting, diarrhea. Otherwise see above Objective:   Physical Exam Vital signs reviewed General appearance - alert, well appearing, and in no distress Heart - normal rate, regular rhythm, normal S1, S2, no murmurs, rubs, clicks or gallops Chest - clear to auscultation, no wheezes, rales or rhonchi, symmetric air entry, no tachypnea, retractions or cyanosis EXT-no edema, varicose veins present. Mild tenderness bilaterally in calves       Assessment & Plan:

## 2012-04-13 NOTE — Assessment & Plan Note (Signed)
Unlikely DVT since bilateral, no significant swelling. We'll check labs today to evaluate. Plan have patient followup in one to 2 weeks with her PCP. Consider trial off HCTZ for these leg cramps.

## 2012-04-13 NOTE — Patient Instructions (Signed)
Please make an appt with Dr. Verdie Drown to discuss your lab results and follow up on your cramps

## 2012-04-13 NOTE — Assessment & Plan Note (Signed)
BP Readings from Last 3 Encounters:  04/13/12 122/82  01/31/12 119/76  09/28/11 106/72   Refilled medications. Check labs today. See PCP in one to 2 weeks.

## 2012-04-14 LAB — VITAMIN D 25 HYDROXY (VIT D DEFICIENCY, FRACTURES): Vit D, 25-Hydroxy: 51 ng/mL (ref 30–89)

## 2012-04-18 ENCOUNTER — Ambulatory Visit: Payer: Medicare Other | Admitting: Family Medicine

## 2012-04-20 ENCOUNTER — Telehealth: Payer: Self-pay | Admitting: Family Medicine

## 2012-04-20 NOTE — Telephone Encounter (Signed)
Pt is requesting results of her labs -

## 2012-04-23 ENCOUNTER — Ambulatory Visit: Payer: Medicare Other | Admitting: Family Medicine

## 2012-04-23 NOTE — Telephone Encounter (Signed)
Discussed lab results.  No changes.

## 2012-04-24 ENCOUNTER — Encounter: Payer: Self-pay | Admitting: Family Medicine

## 2012-04-24 ENCOUNTER — Ambulatory Visit (INDEPENDENT_AMBULATORY_CARE_PROVIDER_SITE_OTHER): Payer: Medicare Other | Admitting: Family Medicine

## 2012-04-24 VITALS — BP 120/88 | HR 60 | Temp 98.3°F | Ht 62.5 in | Wt 198.0 lb

## 2012-04-24 DIAGNOSIS — J029 Acute pharyngitis, unspecified: Secondary | ICD-10-CM

## 2012-04-24 NOTE — Patient Instructions (Addendum)
Consider using an allergy medicine such as cetirizine (zyrtec) or claritin (loratidine) Try not to clear your throat to give you throat a chance to rest Consider nasal saline irrigation/neti pot- See kit called [facility] Sinus rinse  If you continue to have hoarseness after several weeks, please come back for recheck

## 2012-04-24 NOTE — Assessment & Plan Note (Signed)
Sore throat and intermittent hoarseness likely due to post-viral drainage in th setting of repeated irritation from frequent throat clearing.  May have some minimal reflux based on her history.  Advised decreasing throat clearing, empiric treatment with 2nd gen antihistamine and/or nasal saline irrigation.  Advised if is prolonged to return for re-evaluation and referral to ENT for laryngoscopy if continues to have hoarseness.

## 2012-04-24 NOTE — Progress Notes (Signed)
  Subjective:    Patient ID: Kristen [name], female    DOB: January 21, 1941, 71 y.o.   MRN: [id number]  HPIwork in for sore throat  3 weeks ago with sore throat and hoarse, started with a cold.  Hoarseness clears up when she clears her throat.  Has the sensation of wanting to cough.  Overall no worsening.  No fever. No dyspnea or dysphagia.   Concerned because she previously had a serious viral infection years ago as well as a friend with laryngeal cancer.  Never smoker. Review of Systems     Objective:   Physical Exam  GEN: Alert & Oriented, No acute distress HEENT: [place]/AT. EOMI, PERRLA, no conjunctival injection or scleral icterus.  Bilateral tympanic membranes intact without erythema or effusion.  .  Nares without edema or rhinorrhea.  Oropharynx is without erythema or exudates.  No anterior or posterior cervical lymphadenopathy. CV:  Regular Rate & Rhythm, no murmur Respiratory:  Normal work of breathing, CTAB Abd:  + BS, soft, no tenderness to palpation        Assessment & Plan:

## 2012-10-27 ENCOUNTER — Other Ambulatory Visit: Payer: Self-pay | Admitting: Family Medicine

## 2012-11-14 ENCOUNTER — Other Ambulatory Visit: Payer: Self-pay | Admitting: Family Medicine

## 2012-11-14 DIAGNOSIS — Z1231 Encounter for screening mammogram for malignant neoplasm of breast: Secondary | ICD-10-CM

## 2012-11-22 ENCOUNTER — Ambulatory Visit (HOSPITAL_COMMUNITY)
Admission: RE | Admit: 2012-11-22 | Discharge: 2012-11-22 | Disposition: A | Discharge status: Home / Self Care | Payer: Medicare Other | Source: Ambulatory Visit | Attending: Family Medicine | Admitting: Family Medicine

## 2012-11-22 DIAGNOSIS — Z1231 Encounter for screening mammogram for malignant neoplasm of breast: Secondary | ICD-10-CM

## 2012-11-30 ENCOUNTER — Other Ambulatory Visit: Payer: Self-pay | Admitting: Gastroenterology

## 2012-11-30 DIAGNOSIS — R1310 Dysphagia, unspecified: Secondary | ICD-10-CM

## 2012-12-05 ENCOUNTER — Other Ambulatory Visit: Payer: TRICARE For Life (TFL)

## 2012-12-06 ENCOUNTER — Other Ambulatory Visit: Payer: TRICARE For Life (TFL)

## 2012-12-14 ENCOUNTER — Ambulatory Visit
Admission: RE | Admit: 2012-12-14 | Discharge: 2012-12-14 | Disposition: A | Discharge status: Home / Self Care | Payer: Medicare Other | Source: Ambulatory Visit | Attending: Gastroenterology | Admitting: Gastroenterology

## 2012-12-14 DIAGNOSIS — R1310 Dysphagia, unspecified: Secondary | ICD-10-CM

## 2012-12-18 ENCOUNTER — Other Ambulatory Visit: Payer: Self-pay | Admitting: Family Medicine

## 2013-01-31 ENCOUNTER — Encounter: Payer: Self-pay | Admitting: Home Health Services

## 2013-02-06 ENCOUNTER — Ambulatory Visit (INDEPENDENT_AMBULATORY_CARE_PROVIDER_SITE_OTHER): Payer: Medicare Other | Admitting: Family Medicine

## 2013-02-06 ENCOUNTER — Encounter: Payer: Self-pay | Admitting: Family Medicine

## 2013-02-06 VITALS — BP 96/68 | HR 106 | Temp 98.3°F | Ht 62.5 in | Wt 194.0 lb

## 2013-02-06 DIAGNOSIS — W57XXXA Bitten or stung by nonvenomous insect and other nonvenomous arthropods, initial encounter: Secondary | ICD-10-CM | POA: Insufficient documentation

## 2013-02-06 MED ORDER — HYDROXYZINE HCL 25 MG PO TABS: 25.0000 mg | ORAL_TABLET | Freq: Three times a day (TID) | ORAL | Status: DC | PRN

## 2013-02-06 MED ORDER — DOXYCYCLINE HYCLATE 100 MG PO TABS: 100.0000 mg | ORAL_TABLET | Freq: Two times a day (BID) | ORAL | Status: DC

## 2013-02-06 NOTE — Patient Instructions (Signed)
Take the doxycycline (antibiotic) twice a day for 10 days for tick bite and skin infection  If the skin infection worsens or does not improve in the next 48 hours, come back to the clinic to be re-evaluated Or if you have fevers, chills, nausea, come back to the clinic

## 2013-02-06 NOTE — Progress Notes (Signed)
  Subjective:    Patient ID: Kristen [name], female    DOB: January 06, 1941, 72 y.o.   MRN: [id number]  HPI # Tick bite [place] She found a tick bite on her upper arm on Sunday. She soaked tick in hydrogen peroxide and then pulled the tick off with seizures  She endorses splotchy red areas and bumps on skin surface   Review of Systems Denies fevers, chills, nausea, dizziness, numbness, difficulty breathing, rash   Allergies, medication, past medical history reviewed.  Smoking status noted.     Objective:   Physical Exam GEN: NAD PULM: NI WOB  SKIN:    LEFT INNER ARM: 1 cm area of induration with a scab and 6 x 10 cm area of erythema; non-tender, non-warm; no axillary adenopathy      Assessment & Plan:

## 2013-02-06 NOTE — Assessment & Plan Note (Signed)
With cellulitis and without systemic symptoms of infection.  Doxycycline for Lyme disease prevention and cellulitis.  Follow-up as need. See AVS for indications to RTC.

## 2013-02-08 ENCOUNTER — Telehealth: Payer: Self-pay | Admitting: *Deleted

## 2013-02-08 NOTE — Telephone Encounter (Signed)
Received fax from [place] for prior approval of hydroxyzine.  Called [facility] for approval.  Med approved and good through 02/08/14.  Faxed approval to [facility].   Nolene Ebbs, RN

## 2013-03-12 ENCOUNTER — Telehealth: Payer: Self-pay | Admitting: Family Medicine

## 2013-03-12 NOTE — Telephone Encounter (Signed)
Pt has another tick bite and is asking for another round of doxycycline - she gets an infection every time she gets a tick bite (she [place] in a very tick infested area)  [place]

## 2013-03-13 MED ORDER — DOXYCYCLINE HYCLATE 100 MG PO TABS: 100.0000 mg | ORAL_TABLET | Freq: Two times a day (BID) | ORAL | Status: DC

## 2013-03-13 NOTE — Telephone Encounter (Signed)
Pt is calling again.

## 2013-03-13 NOTE — Telephone Encounter (Signed)
Please notify Rx sent

## 2013-03-25 ENCOUNTER — Ambulatory Visit (HOSPITAL_COMMUNITY)
Admission: RE | Admit: 2013-03-25 | Discharge: 2013-03-25 | Disposition: A | Discharge status: Home / Self Care | Payer: Medicare Other | Source: Ambulatory Visit | Attending: Family Medicine | Admitting: Family Medicine

## 2013-03-25 ENCOUNTER — Telehealth: Payer: Self-pay | Admitting: Family Medicine

## 2013-03-25 ENCOUNTER — Ambulatory Visit (INDEPENDENT_AMBULATORY_CARE_PROVIDER_SITE_OTHER): Payer: Medicare Other | Admitting: Family Medicine

## 2013-03-25 DIAGNOSIS — I517 Cardiomegaly: Secondary | ICD-10-CM | POA: Insufficient documentation

## 2013-03-25 DIAGNOSIS — I4891 Unspecified atrial fibrillation: Secondary | ICD-10-CM

## 2013-03-25 DIAGNOSIS — R0600 Dyspnea, unspecified: Secondary | ICD-10-CM

## 2013-03-25 DIAGNOSIS — I10 Essential (primary) hypertension: Secondary | ICD-10-CM

## 2013-03-25 DIAGNOSIS — R0602 Shortness of breath: Secondary | ICD-10-CM | POA: Insufficient documentation

## 2013-03-25 DIAGNOSIS — R0609 Other forms of dyspnea: Secondary | ICD-10-CM

## 2013-03-25 LAB — POCT URINALYSIS DIPSTICK
Bilirubin, UA: NEGATIVE
Glucose, UA: NEGATIVE
Ketones, UA: NEGATIVE
Leukocytes, UA: NEGATIVE
Protein, UA: NEGATIVE

## 2013-03-25 MED ORDER — FUROSEMIDE 20 MG PO TABS: 20.0000 mg | ORAL_TABLET | Freq: Every day | ORAL | Status: DC

## 2013-03-25 MED ORDER — METOPROLOL TARTRATE 50 MG PO TABS: ORAL_TABLET | ORAL | Status: DC

## 2013-03-25 NOTE — Telephone Encounter (Signed)
Advised she take a dose of Lasix. CXR shows interstitial edema without overt pleural effusions.  Advised she take a dose of Lasix tonight and a dose tomorrow.

## 2013-03-25 NOTE — Progress Notes (Signed)
  Subjective:    Patient ID: Kristen [name], female    DOB: Jul 08, 1941, 72 y.o.   MRN: [id number]  HPI # SDA. Flu-like symptoms, aches and pains since Friday 05/16 She has heart failure and she feels like how she felt with an exacerbation and had to be hospitalized She feels gurgling in her chest as well She coughed up some phlegm on Friday but not since then  She also has neck pain and feels like she might have a virus  She is on doxycycline for tick bite   She is stressed and is concerned about her husband being alone and she thinks this is why her BP is high She took her medication as scheduled  Review of Systems Per HPI Denies ear pain, throat pain Endorses heart racing Denies chest pain but left shoulder blade pain  Allergies, medication, past medical history reviewed.  Smoking status noted. History of AFib. Sees Dr. Karen Chafe.      Objective:   Physical Exam O2 sat 94%, T 100.4, BP 176/134  GEN: NAD; well-nourished, -appearing CV: irregularly irregular PULM: NI WOB; CTAB without w/r/r ABD: soft, NT, ND, obese EXT: right 2+, left 1+ pitting pretibial edema; no calf tenderness; 2+ pedal pulses NEURO: normal gait, moves all extremities well  ECHO 09/2010 EF 60-65%     Assessment & Plan:

## 2013-03-25 NOTE — Patient Instructions (Addendum)
Viral infection, bacterial infection, heart failure  1. Increase metoprolol to 1.5 tablets in the morning and at nighttime (75 mg twice a day)  2. Get CXR today   3. Start fluid pill if your weight goes up by more than 2 lbs between today and tomorrow and you have worsening shortness of breath  4. Follow-up on Wednesday

## 2013-03-26 DIAGNOSIS — R0600 Dyspnea, unspecified: Secondary | ICD-10-CM | POA: Insufficient documentation

## 2013-03-26 LAB — COMPREHENSIVE METABOLIC PANEL
Alkaline Phosphatase: 72 U/L (ref 39–117)
BUN: 13 mg/dL (ref 6–23)
CO2: 28 mEq/L (ref 19–32)
Creat: 0.78 mg/dL (ref 0.50–1.10)
Glucose, Bld: 93 mg/dL (ref 70–99)
Total Bilirubin: 2 mg/dL — ABNORMAL HIGH (ref 0.3–1.2)
Total Protein: 7.1 g/dL (ref 6.0–8.3)

## 2013-03-26 LAB — CBC WITH DIFFERENTIAL/PLATELET
Basophils Absolute: 0.1 10*3/uL (ref 0.0–0.1)
HCT: 35.4 % — ABNORMAL LOW (ref 36.0–46.0)
Lymphocytes Relative: 29 % (ref 12–46)
Lymphs Abs: 2.7 10*3/uL (ref 0.7–4.0)
Monocytes Absolute: 0.7 10*3/uL (ref 0.1–1.0)
Neutro Abs: 5.5 10*3/uL (ref 1.7–7.7)
RBC: 4.09 MIL/uL (ref 3.87–5.11)
RDW: 14.8 % (ref 11.5–15.5)
WBC: 9.2 10*3/uL (ref 4.0–10.5)

## 2013-03-26 NOTE — Assessment & Plan Note (Addendum)
4 days of feeling unwell and now she presents with dypsnea and tachycardia. O2 saturation 94%.  Presentation seems consistently with viral infection; there is also concern for ?Lyme disease because she reports being bitten by a tick recently (early disseminated disease, occurring weeks to months following bite, 1% incidence of carditis) . She has chronic atrial fibrillation but now she is tachycardic, and I suspect she may have some heart failure exacerbation. ECHO 2011 showed EF 60-65% without significant hypertrophy.  -Check CXR>>>interstitial edema without overt pleural effusions -Check proBNP>>>2087 (WNL in the past) -Check CMET>>>Cr WNL -She was advised to take a dose of Lasix 20 mg while we were waiting for lab results and based on CXR findings>>>05/20, advised she take Lasix 40 mg bid today and tomorrow morning. She will see me 05/21 at 11 am.  -She was advised to go to ED if worsening dyspnea, chest pain -She was advised to increase her metoprolol from 50 to 75 mg bid.  -She was taking doxycycline inconsistently for possible Lyme disease. She was advised to restart; if concern for early disseminated disease, she will need 14-28 days of antibiotics.

## 2013-03-27 ENCOUNTER — Ambulatory Visit (HOSPITAL_COMMUNITY)
Admission: RE | Admit: 2013-03-27 | Discharge: 2013-03-27 | Disposition: A | Discharge status: Home / Self Care | Payer: Medicare Other | Source: Ambulatory Visit | Attending: Family Medicine | Admitting: Family Medicine

## 2013-03-27 ENCOUNTER — Ambulatory Visit (INDEPENDENT_AMBULATORY_CARE_PROVIDER_SITE_OTHER): Payer: Medicare Other | Admitting: Family Medicine

## 2013-03-27 ENCOUNTER — Encounter: Payer: Self-pay | Admitting: Family Medicine

## 2013-03-27 VITALS — BP 120/80 | HR 77 | Temp 99.0°F | Wt 191.0 lb

## 2013-03-27 DIAGNOSIS — I4891 Unspecified atrial fibrillation: Secondary | ICD-10-CM | POA: Insufficient documentation

## 2013-03-27 DIAGNOSIS — R0600 Dyspnea, unspecified: Secondary | ICD-10-CM

## 2013-03-27 DIAGNOSIS — R0989 Other specified symptoms and signs involving the circulatory and respiratory systems: Secondary | ICD-10-CM

## 2013-03-27 DIAGNOSIS — R9431 Abnormal electrocardiogram [ECG] [EKG]: Secondary | ICD-10-CM | POA: Insufficient documentation

## 2013-03-27 MED ORDER — FUROSEMIDE 20 MG PO TABS: ORAL_TABLET | ORAL | Status: DC

## 2013-03-27 MED ORDER — POTASSIUM CHLORIDE CRYS ER 20 MEQ PO TBCR: EXTENDED_RELEASE_TABLET | ORAL | Status: DC

## 2013-03-27 MED ORDER — GUAIFENESIN-DM 100-10 MG/5ML PO SYRP: 5.0000 mL | ORAL_SOLUTION | Freq: Three times a day (TID) | ORAL | Status: DC | PRN

## 2013-03-27 MED ORDER — DOXYCYCLINE HYCLATE 100 MG PO TABS: 100.0000 mg | ORAL_TABLET | Freq: Two times a day (BID) | ORAL | Status: DC

## 2013-03-27 NOTE — Addendum Note (Signed)
Addended by: Verdie Drown, Levada Dy J on: 03/27/2013 12:28 PM   Modules accepted: Orders

## 2013-03-27 NOTE — Assessment & Plan Note (Addendum)
Improved although oxygen level remains 93%. Symptomatically improved. She has lost 3 lbs on Lasix.  -Continue Lasix. See AVS. She was advised to decrease if orthostatic symptoms.  -Start potassium with Lasix.  -Follow-up next week my next clinic visit but strongly encouraged to come in sooner if any worsening symptoms or chest pain  -20 day course of doxycycline; new Rx for another 10 days ago -Continue metoprolol 75 bid; consider decreasing at next visit back to her previous 50 bid

## 2013-03-27 NOTE — Progress Notes (Addendum)
  Subjective:    Patient ID: Kristen [name], female    DOB: August 09, 1941, 72 y.o.   MRN: [id number]  HPI # Follow-up of heart failure exacerbation She feels like she has a really bad cold, runny nose, cough (nonproductive, not worse at nighttime) She is taking doxycycline  ROS: denies fever, chest pain, diarrhea  Review of Systems Per HPI  Allergies, medication, past medical history reviewed.  Smoking status noted.     Objective:   Physical Exam GEN: NAD; well-nourished, -appearing CV: irregularly irregular; no rubs auscultated PULM: NI WOB; soft bibasilar crackles; no wheezing; good air movement EXT: 1+ pretibial edema bilaterally NEURO: appropriate to questions; alert and oriented; gait normal   ECG: Rate: 104 Rhythm: irregular Axis: normal left-axis Intervals and complexes: PR (normal 0.12-0.20): not claculated P-wave (normal <0.12 duration, 0.25 mv amplitude): N/A QRS complex (normal 0.06-0.10): normal  QT prolongation (normal men ?0.44 sec; women ?0.45 to 0.46)? Yes @ 476 (previous ECG 01/13 460s) Q waves? None ST or T-wave abnormalities? None Hypertrophy?No Extra or skipped beats? None Overall interpretation: atrial fibrillation      Assessment & Plan:

## 2013-03-27 NOTE — Patient Instructions (Addendum)
For Lasix: -Continue 2 tablets twice a day for the next 3 days (Wednesday, Thursday, Friday) -Then take 2 tablets once a day (Saturday, Sunday, Monday, Tuesday) -Take 1 potassium tablet every time you take 2 tablets of Lasix   Continue metoprolol 1.5 tablets until you see me  Continue doxycyline for another 10 days after you finish current prescription   Follow-up on Tuesday

## 2013-04-09 ENCOUNTER — Encounter: Payer: Self-pay | Admitting: Family Medicine

## 2013-04-09 ENCOUNTER — Ambulatory Visit (INDEPENDENT_AMBULATORY_CARE_PROVIDER_SITE_OTHER): Payer: Medicare Other | Admitting: Family Medicine

## 2013-04-09 VITALS — BP 101/60 | HR 72 | Temp 99.0°F | Ht 62.5 in | Wt 187.0 lb

## 2013-04-09 DIAGNOSIS — T148 Other injury of unspecified body region: Secondary | ICD-10-CM

## 2013-04-09 DIAGNOSIS — W57XXXA Bitten or stung by nonvenomous insect and other nonvenomous arthropods, initial encounter: Secondary | ICD-10-CM

## 2013-04-09 DIAGNOSIS — R0600 Dyspnea, unspecified: Secondary | ICD-10-CM

## 2013-04-09 DIAGNOSIS — R0989 Other specified symptoms and signs involving the circulatory and respiratory systems: Secondary | ICD-10-CM

## 2013-04-09 NOTE — Assessment & Plan Note (Signed)
Continue finish course of doxcycyline. 2 days left.

## 2013-04-09 NOTE — Patient Instructions (Addendum)
Go back to metoprolol 50 mg twice a day (1 tablet twice a day)  Stop the Lasix Stop the potassium  Finish doxycycline   Follow-up in 1 month or sooner if needed  If you have shortness of breath or weight gain (you weight 187 here in clinic; weigh yourself at home; if you gain more than 5 lbs in a week and you have difficulty breathing, come in)

## 2013-04-09 NOTE — Progress Notes (Signed)
  Subjective:    Patient ID: Kristen [name], female    DOB: 1940-11-23, 72 y.o.   MRN: [id number]  HPI # Follow-up dyspnea 2/2 CHF exacerbation (elevatd proBNP, vascular congestion on CXR) 2/2 AF (2/2 ?URI) Initially seen here 05/19, followed-up 05/21, now here for 3rd follow-up   We had increased metoprolol from 50 to 75 prior and started her on Lasix She was also advised to complete course of doxycycline due to concern for lime disease with 1% risk of carditis.   Today she reports feeling. She denies dyspnea.  She has some chest congestion.   Lasix: she was taking 40 daily but backed down to 20 mg daily yesterday Metoprolol 75 bid.   Review of Systems Per HPI Denies fevers, chills, chest pain, constipation      Objective:   Physical Exam GEN: NAD; well-nourished, -appearing CV: RRR PULM: NI WOB; CTAB without w/r/r ABD: soft, NT, ND EXT: no pitting edema  Assessment & Plan:

## 2013-04-09 NOTE — Assessment & Plan Note (Signed)
Resolved. 98% on RA today, HR 70s; asymptomatic.  -Stop Lasix, decrease metoprolol back to 50 bid (blood pressure on lower side today) -Monitor weights; see AVS for details; follow-up if weight gain and dyspnea, otherwise follow-up in 1 month

## 2013-05-01 ENCOUNTER — Ambulatory Visit: Payer: Medicare Other | Admitting: Home Health Services

## 2013-05-01 ENCOUNTER — Encounter: Payer: Self-pay | Admitting: Family Medicine

## 2013-05-01 ENCOUNTER — Ambulatory Visit (INDEPENDENT_AMBULATORY_CARE_PROVIDER_SITE_OTHER): Payer: Medicare Other | Admitting: Family Medicine

## 2013-05-01 VITALS — BP 135/80 | HR 97 | Temp 97.7°F | Ht 62.5 in | Wt 195.0 lb

## 2013-05-01 DIAGNOSIS — W57XXXA Bitten or stung by nonvenomous insect and other nonvenomous arthropods, initial encounter: Secondary | ICD-10-CM

## 2013-05-01 DIAGNOSIS — Z0000 Encounter for general adult medical examination without abnormal findings: Secondary | ICD-10-CM

## 2013-05-01 DIAGNOSIS — T148 Other injury of unspecified body region: Secondary | ICD-10-CM

## 2013-05-01 DIAGNOSIS — R0989 Other specified symptoms and signs involving the circulatory and respiratory systems: Secondary | ICD-10-CM

## 2013-05-01 DIAGNOSIS — R0600 Dyspnea, unspecified: Secondary | ICD-10-CM

## 2013-05-01 MED ORDER — DOXYCYCLINE HYCLATE 100 MG PO TABS: 200.0000 mg | ORAL_TABLET | Freq: Once | ORAL | Status: DC | PRN

## 2013-05-01 NOTE — Progress Notes (Signed)
  Subjective:    Patient ID: Kristen [name], female    DOB: [birth year], 72 y.o.   MRN: [id number]  HPI # Follow-up heart failure exacerbation; she had presented with dyspnea  ROS: denies dyspnea, chest pain, worsening leg swelling  # She reports she gets bit by deer ticks frequently and did not finish longer course of doxycyline for Lyme disease treatment due to concern for carditis because she felt better and wanted some extra doxycycline on hand in case she got another tick bite.  ROS: denies rash  Review of Systems Per HPI  Allergies, medication, past medical history reviewed.  Smoking status noted.     Objective:   Physical Exam GEN: NAD CV: irregularly irregular PULM: NI WOB; CTAB without w/r/r EXT: 1+ pitting pretibial edema    Assessment & Plan:

## 2013-05-01 NOTE — Progress Notes (Signed)
Patient here for annual wellness visit, patient reports: Risk Factors/Conditions needing evaluation or treatment: Pt does not have any new risk factors that need evaluation Home Safety: Pt lives in mobile home, takes care of husband who lives in another mobile home near by Other Information: Corrective lens: Pt uses daily corrective lens.  Has annual eye exams, reports having some cataracts Dentures: Pt has partial.  Memory: Pt denies memory problems. Patient's Mini Mental Score (recorded in doc. flowsheet): 30  Balance/Gait: Pt reports not falls in the past 12 months.       Annual Wellness Visit Requirements Recorded Today In  Medical, family, social history Past Medical, Family, Social History Section  Current providers Care team  Current medications Medications  Wt, BP, Ht, BMI Vital signs  Tobacco, alcohol, illicit drug use History  ADL Nurse Assessment  Depression Screening Nurse Assessment  Cognitive impairment Nurse Assessment  Mini Mental Status Document Flowsheet  Fall Risk Fall/Depression  Home Safety Progress Note  End of Life Planning (welcome visit) Social Documentation  Medicare preventative services Progress Note  Risk factors/conditions needing evaluation/treatment Progress Note  Personalized health advice Patient Instructions, goals, letter  Diet & Exercise Social Documentation  Emergency Contact Social Documentation  Seat Belts Social Documentation  Sun exposure/protection Social Documentation

## 2013-05-01 NOTE — Assessment & Plan Note (Signed)
Resolved

## 2013-05-01 NOTE — Patient Instructions (Addendum)
You may take doxycycline 200 mg when if you get a tick bite to prevent Lyme disease  If you have difficulty breathing, worsening leg swelling, chest pain see [place] or Dr. Wynonia Lawman.  Follow-up as needed.

## 2013-05-01 NOTE — Assessment & Plan Note (Signed)
She gets frequent tick bites; will send Rx to use prn.

## 2013-05-21 ENCOUNTER — Telehealth: Payer: Self-pay | Admitting: Family Medicine

## 2013-05-22 NOTE — Telephone Encounter (Signed)
error 

## 2013-06-01 ENCOUNTER — Other Ambulatory Visit: Payer: Self-pay | Admitting: Family Medicine

## 2013-07-24 ENCOUNTER — Telehealth: Payer: Self-pay | Admitting: Family Medicine

## 2013-07-24 NOTE — Telephone Encounter (Signed)
MD in clinic tomorrow.  Will forward to him. Fle[place], [name]

## 2013-07-24 NOTE — Telephone Encounter (Signed)
Rep from [employer] calls inquiring about a fax sent on 9/16 and 9/17. Informed rep that paperwork is still waiting in Dr. Marissa Calamity box. Please complete and fax back as soon as possible.

## 2013-07-24 NOTE — Telephone Encounter (Signed)
I actually received like 5 faxes requesting a back brace. I have not seen or evaluated the patient for this problem, and the last visits by Dr. Verdie Drown make no mention of her back pain (problem list has it as 4 years ago by Dr. Mylinda Latina). In order to fulfill this request I need to see her in clinic and determine if the brace is medically warranted. -Mitzi Hansen

## 2013-07-25 NOTE — Telephone Encounter (Signed)
Left message for pt to call back and verify whether or she has asked [employer] [facility] for a back brace.  We do not have a current visit discussing this.  If she has not we will fax them back and let them know she is not interested.  Thanks [employer]

## 2013-08-16 ENCOUNTER — Other Ambulatory Visit: Payer: Self-pay | Admitting: Family Medicine

## 2013-10-08 ENCOUNTER — Ambulatory Visit: Payer: Medicare Other | Admitting: Family Medicine

## 2013-10-10 ENCOUNTER — Encounter: Payer: Self-pay | Admitting: Family Medicine

## 2013-10-10 ENCOUNTER — Ambulatory Visit (INDEPENDENT_AMBULATORY_CARE_PROVIDER_SITE_OTHER): Payer: Medicare Other | Admitting: Family Medicine

## 2013-10-10 VITALS — BP 126/72 | HR 84 | Ht 62.5 in | Wt 197.2 lb

## 2013-10-10 DIAGNOSIS — Z131 Encounter for screening for diabetes mellitus: Secondary | ICD-10-CM

## 2013-10-10 DIAGNOSIS — F411 Generalized anxiety disorder: Secondary | ICD-10-CM

## 2013-10-10 DIAGNOSIS — I4891 Unspecified atrial fibrillation: Secondary | ICD-10-CM

## 2013-10-10 DIAGNOSIS — I10 Essential (primary) hypertension: Secondary | ICD-10-CM

## 2013-10-10 LAB — BASIC METABOLIC PANEL
CO2: 30 mEq/L (ref 19–32)
Calcium: 9.7 mg/dL (ref 8.4–10.5)
Chloride: 101 mEq/L (ref 96–112)
Creat: 0.89 mg/dL (ref 0.50–1.10)
Glucose, Bld: 87 mg/dL (ref 70–99)
Potassium: 3.7 mEq/L (ref 3.5–5.3)

## 2013-10-10 MED ORDER — ALENDRONATE SODIUM 10 MG PO TABS: 10.0000 mg | ORAL_TABLET | ORAL | Status: DC

## 2013-10-10 MED ORDER — DIAZEPAM 5 MG PO TABS: 5.0000 mg | ORAL_TABLET | Freq: Every day | ORAL | Status: DC

## 2013-10-10 NOTE — Patient Instructions (Signed)
Please schedule a follow up in 3 months  Work on adding more vegetables and changing your snacks in between meals, try to avoid eating at night. For popcorn try to use natural, unflavored popcorn and add a little bit of olive oil.

## 2013-10-10 NOTE — Assessment & Plan Note (Signed)
Follows with Dr. Wynonia Lawman every 6 months, stable. On pradaxa which she has tolerated well.

## 2013-10-10 NOTE — Progress Notes (Signed)
   Subjective:    Patient ID: Kristen [name], female    DOB: [birth year], 72 y.o.   MRN: [id number]  HPI  Here for med refill and to meet new PCP.  No complaints today other than her weight. Current medical problems are stable and no recent changes made. She follows up with a cardiologist after reported sequelae from Lyme disease. Last had a tick bite over the summer that she was treated for doxycycline, no recent tick bites since then.  # Social - At home she takes care of her husband who has multiple medical conditions, in addition to taking care of 3 homes.   # Diet - eats many snacks at night (chocolate, popcorn), as well as long periods of time without eating while doing errands which she will then go to get fast food. She identifies these as problems and is able to come up with her own ideas of what changes to make, but says she knows it is all on her own self discipline.   Review of Systems No CP, SOB, diarrhea/constipation, leg swelling.    Objective:   Physical Exam BP 126/72  Pulse 84  Ht 5' 2.5" (1.588 m)  Wt 197 lb 3.2 oz (89.449 kg)  BMI 35.47 kg/m2  General: NAD, pleasant CV: Irregularly irregular, normal heart sounds. 2+ DP and radial pulses Resp: CTAB, effort normal Ext: no leg edema, no cyanosis.     Assessment & Plan:  See Problem List documentation

## 2013-10-10 NOTE — Assessment & Plan Note (Addendum)
Controlled. Continue current meds (lisinopril-hctz 20-25, metoprolol 50mg  BID). Check bmet today.

## 2013-10-11 ENCOUNTER — Encounter: Payer: Self-pay | Admitting: Family Medicine

## 2013-11-22 ENCOUNTER — Telehealth: Payer: Self-pay | Admitting: Family Medicine

## 2013-11-22 DIAGNOSIS — F411 Generalized anxiety disorder: Secondary | ICD-10-CM

## 2013-11-22 NOTE — Telephone Encounter (Signed)
Pt called and would like another prescription of Valium she has lost the one she received a few weeks ago. jw

## 2013-11-25 ENCOUNTER — Telehealth: Payer: Self-pay | Admitting: Family Medicine

## 2013-11-25 NOTE — Telephone Encounter (Signed)
LM for pt to call back.  Informed on her identified VM that she can try taking a hot steamy shower or just let bathroom get steamy.  She can also try a humidifier to see if it breaks up some of that congestion.  Informed her that it is against policy to prescribe antibiotics without evaluating pt in office first.  Please help her make an appt on crosscover if she desires.  Thanks [employer]

## 2013-11-25 NOTE — Telephone Encounter (Signed)
Has chest congestion. Temp is 99.1 Has been taking robitussion Wanted to know what to do Get a prescription maybe Please advise

## 2013-11-26 ENCOUNTER — Ambulatory Visit (INDEPENDENT_AMBULATORY_CARE_PROVIDER_SITE_OTHER): Payer: Medicare Other | Admitting: Family Medicine

## 2013-11-26 ENCOUNTER — Encounter: Payer: Self-pay | Admitting: Family Medicine

## 2013-11-26 VITALS — BP 115/72 | HR 100 | Temp 99.6°F | Ht 62.5 in | Wt 197.0 lb

## 2013-11-26 DIAGNOSIS — R6889 Other general symptoms and signs: Secondary | ICD-10-CM

## 2013-11-26 NOTE — Assessment & Plan Note (Signed)
Looks great and currently without symptoms except mild cough.  Improving, no need for treatment.  See instructions Taking Robitussin with some relief of cough Had flu shot at outside facility (not here with [place]).  FU if worsening or no improvement.

## 2013-11-26 NOTE — Progress Notes (Signed)
Subjective:    Kristen [name] is a 73 y.o. female who presents to [facility] today for chest congestion:  1.  Flu-like illness:  STarted on Sunday.  With cough and rhinorrhea.  Sunday night began having nausea and diarrhea.  Yesterday was worse with congestion and diarrhea.  Began to improve last night.  No further episdes of diarrhea since last night.  No vomiting, only nausea, which has also resolved.  Minimal cough today.  Subjectively feels much better today.  Cough no longer productive.    Elevated temp without fever (99.1) at home.  Minimal chills Sunday night, none any longer.    ROS as above per HPI, otherwise neg.    The following portions of the patient's history were reviewed and updated as appropriate: allergies, current medications, past medical history, family and social history, and problem list. Patient is a nonsmoker.    PMH reviewed.  Past Medical History  Diagnosis Date  . Depression   . Irregular heart beat 2012  . Diverticula of colon   . Urinary tract infection   . Atrial fibrillation   . Cardiac dysrhythmia, unspecified   . Anxiety   . Fatigue   . Osteopenia   . Essential hypertension, benign   . Lumbar back pain   . Need for prophylactic vaccination and inoculation against influenza    Past Surgical History  Procedure Laterality Date  . Breast surgery  1980    cyst removed  . Tubal ligation  1980  . Knee cartilage surgery      Medications reviewed. Current Outpatient Prescriptions  Medication Sig Dispense Refill  . alendronate (FOSAMAX) 10 MG tablet Take 1 tablet (10 mg total) by mouth once a week. Take with a full glass of water on an empty stomach.  30 tablet  1  . Ascorbic Acid (VITAMIN C) 100 MG tablet Take 100 mg by mouth daily.        Marland Kitchen b complex vitamins tablet Take 1 tablet by mouth daily.        . calcium citrate-vitamin D (CITRACAL+D) 315-200 MG-UNIT per tablet Take 1 tablet by mouth daily.        . Dabigatran Etexilate Mesylate (PRADAXA) 150 MG  CAPS Take 150 mg by mouth every 12 (twelve) hours.        . diazepam (VALIUM) 5 MG tablet Take 1 tablet (5 mg total) by mouth at bedtime.  30 tablet  0  . doxycycline (VIBRA-TABS) 100 MG tablet Take 2 tablets (200 mg total) by mouth once as needed. If you get tick bite.  30 tablet  0  . fish oil-omega-3 fatty acids 1000 MG capsule Take 2 g by mouth daily.      Marland Kitchen guaiFENesin-dextromethorphan (ROBITUSSIN DM) 100-10 MG/5ML syrup Take 5 mLs by mouth 3 (three) times daily as needed for cough.  118 mL  0  . hydrOXYzine (ATARAX/VISTARIL) 25 MG tablet Take 1 tablet (25 mg total) by mouth 3 (three) times daily as needed for itching.  10 tablet  0  . lisinopril-hydrochlorothiazide (PRINZIDE,ZESTORETIC) 20-25 MG per tablet TAKE 1 TABLET BY MOUTH DAILY.  30 tablet  6  . metoprolol (LOPRESSOR) 50 MG tablet Take 1 tablet (50 mg) in the AM and 1 tablet PM.  30 tablet  3  . Nutritional Supplements (NUTRITIONAL SUPPLEMENT PO) Take by mouth. Acai berry/green tea, fish oil        No current facility-administered medications for this visit.     Objective:   Physical Exam BP  115/72  Pulse 100  Temp(Src) 99.6 F (37.6 C) (Oral)  Ht 5' 2.5" (1.588 m)  Wt 197 lb (89.359 kg)  BMI 35.44 kg/m2  SpO2 94% Gen:  Alert, cooperative patient who appears stated age in no acute distress.  Vital signs reviewed. HEENT:  [place]/AT.  EOMI, PERRL.  Minimally erythematous turbinates BL.  MMM, tonsils non-erythematous, non-edematous.  External ears WNL, Bilateral TM's normal without retraction, redness or bulging.  Cardiac:  Irregular rhythm, regular rate.   Pulm:  Clear to auscultation bilaterally with good air movement.  No crackles.     Abd:  Soft/nondistended/nontender.   Exts: No leg edema.   No results found for this or any previous visit (from the past 72 hour(s)).

## 2013-11-26 NOTE — Patient Instructions (Signed)
I looks like you have had the flu or a virus similar to the flu.  You're doing all the right things.  Keep taking the cough syrup.  You can also try tea and honey.  If you start getting worse, bad fevers, or vomiting, please come back or go to the ED after work.  Make sure to keep care of yourself along with your husband.

## 2013-11-27 MED ORDER — DIAZEPAM 5 MG PO TABS: 5.0000 mg | ORAL_TABLET | Freq: Every day | ORAL | Status: DC | PRN

## 2013-11-27 NOTE — Addendum Note (Signed)
Addended by: [name] on: 11/27/2013 08:39 AM   Modules accepted: Orders

## 2013-11-27 NOTE — Telephone Encounter (Signed)
Prescription left at front desk. Can you call and let patient know? Thanks. Mitzi Hansen

## 2013-11-27 NOTE — Telephone Encounter (Signed)
LMOVM that she can "pick up the rx you requested" Fleeger, [name]

## 2014-01-01 ENCOUNTER — Other Ambulatory Visit: Payer: Self-pay | Admitting: *Deleted

## 2014-01-01 MED ORDER — ALENDRONATE SODIUM 10 MG PO TABS: 10.0000 mg | ORAL_TABLET | ORAL | Status: DC

## 2014-01-01 NOTE — Telephone Encounter (Signed)
Request for 90 day supply. Ermin Parisien L, RN  

## 2014-03-18 ENCOUNTER — Encounter: Payer: Self-pay | Admitting: Family Medicine

## 2014-03-18 ENCOUNTER — Ambulatory Visit
Admission: RE | Admit: 2014-03-18 | Discharge: 2014-03-18 | Disposition: A | Discharge status: Home / Self Care | Payer: Medicare Other | Source: Ambulatory Visit | Attending: Family Medicine | Admitting: Family Medicine

## 2014-03-18 ENCOUNTER — Ambulatory Visit (INDEPENDENT_AMBULATORY_CARE_PROVIDER_SITE_OTHER): Payer: Medicare Other | Admitting: Family Medicine

## 2014-03-18 VITALS — BP 120/53 | HR 88 | Temp 99.0°F | Ht 62.5 in | Wt 190.2 lb

## 2014-03-18 DIAGNOSIS — R0989 Other specified symptoms and signs involving the circulatory and respiratory systems: Secondary | ICD-10-CM

## 2014-03-18 DIAGNOSIS — J3489 Other specified disorders of nose and nasal sinuses: Secondary | ICD-10-CM

## 2014-03-18 DIAGNOSIS — R05 Cough: Secondary | ICD-10-CM

## 2014-03-18 DIAGNOSIS — R059 Cough, unspecified: Secondary | ICD-10-CM

## 2014-03-18 MED ORDER — LORATADINE 10 MG PO TABS: 10.0000 mg | ORAL_TABLET | Freq: Every day | ORAL | Status: DC

## 2014-03-18 NOTE — Patient Instructions (Signed)
Kristen [name], it was a pleasure seeing you today. Today we talked about your upper respiratory symptoms. I'm unsure of exactly what is causing your symptoms, but it could be a combination of allergies and something else. On your exam I heard some sounds that made me think there might be fluid in your lungs. I am ordering an x-ray and some labs. If you develop and chest pain, shortness of breath, please come back to be evaluated.  Please schedule an appointment to see Dr. Lamar Benes in 1-2 weeks.  If you have any questions or concerns, please do not hesitate to call the office at [phone].  Sincerely,  Cordelia Poche, MD

## 2014-03-19 DIAGNOSIS — R05 Cough: Secondary | ICD-10-CM | POA: Insufficient documentation

## 2014-03-19 DIAGNOSIS — R059 Cough, unspecified: Secondary | ICD-10-CM | POA: Insufficient documentation

## 2014-03-19 LAB — BASIC METABOLIC PANEL
BUN: 17 mg/dL (ref 6–23)
CALCIUM: 9.2 mg/dL (ref 8.4–10.5)
CO2: 27 mEq/L (ref 19–32)
Chloride: 94 mEq/L — ABNORMAL LOW (ref 96–112)
Creat: 0.89 mg/dL (ref 0.50–1.10)
Glucose, Bld: 116 mg/dL — ABNORMAL HIGH (ref 70–99)
Potassium: 3.4 mEq/L — ABNORMAL LOW (ref 3.5–5.3)
Sodium: 136 mEq/L (ref 135–145)

## 2014-03-19 LAB — PRO B NATRIURETIC PEPTIDE: PRO B NATRI PEPTIDE: 2060 pg/mL — AB (ref <126)

## 2014-03-19 NOTE — Assessment & Plan Note (Signed)
Patient may have allergies vs URI. Concern for possible CHF. Patient has history of CHF. Last proBNP greater than 2000. No recent echo. Currently not showing symptoms of CHF, but has crackles on exam. Will obtain chest x-ray and proBNP. Will most likely recommend echo for follow-up. Will need to follow-up with PCP.

## 2014-03-19 NOTE — Progress Notes (Signed)
   Subjective:    Patient ID: Kristen [name], female    DOB: 06/02/1941, 73 y.o.   MRN: [id number]  HPI  Patient presents to the clinic with a 5 day history of dry cough with associated sneezing. Patient states she started feeling bad on Thursday. Her cough has no sputum production. She has used Robitussin DM, cough drops and tylenol, which have helped with her symptoms. Patient states she has a history of CHF caused by Flecainide. Last Echo showed ef of 60%.  Past Medical History  Diagnosis Date  . Depression   . Irregular heart beat 2012  . Diverticula of colon   . Urinary tract infection   . Atrial fibrillation   . Cardiac dysrhythmia, unspecified   . Anxiety   . Fatigue   . Osteopenia   . Essential hypertension, benign   . Lumbar back pain   . Need for prophylactic vaccination and inoculation against influenza     Review of Systems  Constitutional: Negative for fever.  HENT: Positive for sneezing. Negative for postnasal drip and rhinorrhea.   Respiratory: Positive for cough. Negative for shortness of breath.   Cardiovascular: Negative for chest pain and leg swelling.       Objective:   BP 120/53  Pulse 88  Temp(Src) 99 F (37.2 C) (Oral)  Ht 5' 2.5" (1.588 m)  Wt 190 lb 3.2 oz (86.274 kg)  BMI 34.21 kg/m2  SpO2 92%  Physical Exam  Constitutional: She appears well-developed and well-nourished.  Cardiovascular: Normal rate.  An irregularly irregular rhythm present.  Pulmonary/Chest: Effort normal. She has no decreased breath sounds. She has rales in the right lower field and the left lower field.  Musculoskeletal:       Right lower leg: She exhibits swelling. She exhibits no edema.       Left lower leg: She exhibits swelling. She exhibits no edema.  Skin: Skin is warm and dry.       Assessment & Plan:

## 2014-03-20 ENCOUNTER — Other Ambulatory Visit: Payer: Self-pay | Admitting: Family Medicine

## 2014-03-20 ENCOUNTER — Telehealth: Payer: Self-pay | Admitting: Family Medicine

## 2014-03-20 DIAGNOSIS — I517 Cardiomegaly: Secondary | ICD-10-CM

## 2014-03-20 MED ORDER — FUROSEMIDE 20 MG PO TABS: 20.0000 mg | ORAL_TABLET | Freq: Every day | ORAL | Status: DC

## 2014-03-20 NOTE — Progress Notes (Signed)
Patient's proBNP elevated. Chest x-ray shows stable cardiomegaly. Will call in Lasix for diuresis and order echocardiogram. Patient will follow-up with PCP. Called patient but no response. Left message to return call.

## 2014-03-20 NOTE — Telephone Encounter (Signed)
Called patient and explained results of Chest x-ray and blood work. Explained I would like to her to get an echo. I am also prescribing her lasix and will have her follow-up next week with her PCP. Patient understands and agreed with plan.

## 2014-03-20 NOTE — Telephone Encounter (Signed)
Patient requesting lab and xray results from 03/18/14. Patient seen Dr. Lonny Prude.

## 2014-03-26 ENCOUNTER — Telehealth: Payer: Self-pay | Admitting: Family Medicine

## 2014-03-26 DIAGNOSIS — I517 Cardiomegaly: Secondary | ICD-10-CM

## 2014-03-26 NOTE — Telephone Encounter (Signed)
Received denial letter from [employer] for echo Needs to know symptons and reason for echo. Please send to [employer]

## 2014-03-29 NOTE — Telephone Encounter (Signed)
Can you call this patient on Tuesday and ask her to bring in a copy of the letter she received from the insurance company? (we can make the copy and put it in my box if she wants to keep the original). We never got a letter of denial in the mail so we need the information from the letter to see exactly what they said, along with reference numbers, etc. Thanks. -Dr. Lamar Benes

## 2014-04-01 ENCOUNTER — Other Ambulatory Visit: Payer: Self-pay | Admitting: Family Medicine

## 2014-04-01 ENCOUNTER — Telehealth: Payer: Self-pay | Admitting: Family Medicine

## 2014-04-01 DIAGNOSIS — Z1231 Encounter for screening mammogram for malignant neoplasm of breast: Secondary | ICD-10-CM

## 2014-04-01 NOTE — Telephone Encounter (Signed)
Pt says the referral received by Dr Wynonia Lawman states the echo was being done at [facility]. It needs to say it is being done at Dr Ezekiel Slocumb office not [facility].  Please correct and resend.

## 2014-04-01 NOTE — Telephone Encounter (Signed)
Changed and refaxed. Kristen [name]

## 2014-04-01 NOTE — Telephone Encounter (Signed)
Spoke with patient and she states that she will try to find letter.  She is not concerned if the insurance will pay for it, she needs [place] to call Dr. Thurman Coyer office and let them know why we want her to have this test.  She is fine paying for if she has to.  Her appt is tomorrow morning at 11am.  [phone].  Shaina Gullatt,CMA

## 2014-04-01 NOTE — Telephone Encounter (Signed)
Pt called because she has an 11:00 am appointment for 5/27 to have an echocardiogram done. The officer that she is having this still does not have the orders. Dr. Teryl Lucy issued the orders. jw

## 2014-04-01 NOTE — Telephone Encounter (Signed)
Order faxed to Dr. Wynonia Lawman' s office [phone]) Kristen [name]

## 2014-04-02 NOTE — Telephone Encounter (Signed)
Spoke with [place] and since this order was denied once we have to start an appeal, if this test is a necessity. They will need additional information regarding reasoning for this test faxed to [phone].  Original Case# [id number] will need results of xray and office visit notes.  If you can document xray results given to patient and additional reasons for test I will fax this over.  Jazmin Hartsell,CMA

## 2014-04-08 ENCOUNTER — Ambulatory Visit (HOSPITAL_COMMUNITY)
Admission: RE | Admit: 2014-04-08 | Discharge: 2014-04-08 | Disposition: A | Discharge status: Home / Self Care | Payer: Medicare Other | Source: Ambulatory Visit | Attending: Family Medicine | Admitting: Family Medicine

## 2014-04-08 DIAGNOSIS — Z1231 Encounter for screening mammogram for malignant neoplasm of breast: Secondary | ICD-10-CM

## 2014-04-22 NOTE — Telephone Encounter (Signed)
Can you call the patient tomorrow and ask if she had the echo done, (if so request results, nothing received in my inbox/scanned in), and if so we would need to send in Dr. Lisbeth Ply note + x-ray results to insurance if she still wants [place] to get it processed (she previously told me she would pay out of pocket). Thanks.

## 2014-04-23 NOTE — Telephone Encounter (Signed)
LM for patient asking that she request echo results from Dr. Wynonia Lawman be sent to [place].  Also if she has received anything from her insurance regarding this procedure since we are going to work on this appeal.  Johnney Ou

## 2014-06-06 ENCOUNTER — Other Ambulatory Visit: Payer: Self-pay | Admitting: Family Medicine

## 2014-06-09 ENCOUNTER — Encounter: Payer: Self-pay | Admitting: Family Medicine

## 2014-06-09 ENCOUNTER — Ambulatory Visit (INDEPENDENT_AMBULATORY_CARE_PROVIDER_SITE_OTHER): Payer: Medicare Other | Admitting: Family Medicine

## 2014-06-09 VITALS — BP 108/65 | Temp 98.3°F | Wt 189.0 lb

## 2014-06-09 DIAGNOSIS — R7309 Other abnormal glucose: Secondary | ICD-10-CM

## 2014-06-09 DIAGNOSIS — B372 Candidiasis of skin and nail: Secondary | ICD-10-CM

## 2014-06-09 DIAGNOSIS — R739 Hyperglycemia, unspecified: Secondary | ICD-10-CM

## 2014-06-09 DIAGNOSIS — E7800 Pure hypercholesterolemia, unspecified: Secondary | ICD-10-CM

## 2014-06-09 LAB — LIPID PANEL
CHOL/HDL RATIO: 3.5 ratio
CHOLESTEROL: 224 mg/dL — AB (ref 0–200)
HDL: 64 mg/dL (ref >39)
LDL Cholesterol: 132 mg/dL — ABNORMAL HIGH (ref 0–99)
TRIGLYCERIDES: 142 mg/dL (ref <150)
VLDL: 28 mg/dL (ref 0–40)

## 2014-06-09 LAB — POCT GLYCOSYLATED HEMOGLOBIN (HGB A1C): Hemoglobin A1C: 5.6

## 2014-06-09 MED ORDER — KETOCONAZOLE 2 % EX CREA: 1.0000 "application " | TOPICAL_CREAM | Freq: Two times a day (BID) | CUTANEOUS | Status: DC

## 2014-06-09 MED ORDER — MICONAZOLE NITRATE 2 % POWD: 1.0000 | Freq: Two times a day (BID) | Status: DC

## 2014-06-09 NOTE — Progress Notes (Signed)
Patient ID: Kristen [name], female   DOB: Jul 13, 1941, 73 y.o.   MRN: [id number]   Subjective:    Patient ID: Kristen [name], female    DOB: 1941-11-06, 73 y.o.   MRN: [id number]  HPI  CC: dermatitis under breast  # Dermatitis under right breast:  Thinks related to cats/fleas that she cuddles with (though not skin on skin)  First noticed 2 months ago  More painful than itchy  Not draining or bleeding  Has tried: Triple antibiotic, balm-x, gold bond (burned when she put it on)  No changes in bra, laundry detergent ROS: no fevers/chills  # Annual  Mammogram done May 2015  Last lipids June 2013  Exercise: does yoga/stretching, some aerobic exercises several times a week ROS: no CP, occasional palpitations, no weight loss, no fatigue, no abdominal pain.   Review of Systems   See HPI for ROS. Objective:  Temp(Src) 98.3 F (36.8 C) (Oral)  Wt 189 lb (85.73 kg)  General: NAD Cardiac: irregularly irregular, normal heart sounds, no murmurs. 2+ radial and PT pulses bilaterally Respiratory: CTAB, normal effort Abdomen: soft, obese, nontender, nondistended, no hepatic or splenomegaly. Bowel sounds present Extremities: no edema or cyanosis. WWP. Skin: erythematous macular rashes with satellite papules underneath both breasts, right breast at crease with broken skin.  Neuro: alert and oriented, no focal deficits     Assessment & Plan:  See Problem List Documentation  1. Annual visit: draw cholesterol and A1c. Mammogram up to date.

## 2014-06-09 NOTE — Telephone Encounter (Signed)
Attempted to call patient.  "answering machine is full and cant accept incoming messages at this time". Kristen [name], [name]

## 2014-06-09 NOTE — Telephone Encounter (Signed)
Can you call patient and let her know this is available for pickup at the front desk?

## 2014-06-09 NOTE — Patient Instructions (Signed)
Use the ketoconazole cream 2 times a day until the skin clears up. In between using the cream keep the breasts clean. After the skin lesions clear, use the antifungal powder to keep the area dry.  Candida Infection A Candida infection (also called yeast, fungus, and Monilia infection) is an overgrowth of yeast that can occur anywhere on the body. A yeast infection commonly occurs in warm, moist body areas. Usually, the infection remains localized but can spread to become a systemic infection. A yeast infection may be a sign of a more severe disease such as diabetes, leukemia, or AIDS. A yeast infection can occur in both men and women. In women, Candida vaginitis is a vaginal infection. It is one of the most common causes of vaginitis. Men usually do not have symptoms or know they have an infection until other problems develop. Men may find out they have a yeast infection because their sex partner has a yeast infection. Uncircumcised men are more likely to get a yeast infection than circumcised men. This is because the uncircumcised glans is not exposed to air and does not remain as dry as that of a circumcised glans. Older adults may develop yeast infections around dentures. CAUSES  Women  Antibiotics.  Steroid medication taken for a long time.  Being overweight (obese).  Diabetes.  Poor immune condition.  Certain serious medical conditions.  Immune suppressive medications for organ transplant patients.  Chemotherapy.  Pregnancy.  Menstruation.  Stress and fatigue.  Intravenous drug use.  Oral contraceptives.  Wearing tight-fitting clothes in the crotch area.  Catching it from a sex partner who has a yeast infection.  Spermicide.  Intravenous, urinary, or other catheters. Men  Catching it from a sex partner who has a yeast infection.  Having oral or anal sex with a person who has the infection.  Spermicide.  Diabetes.  Antibiotics.  Poor immune  system.  Medications that suppress the immune system.  Intravenous drug use.  Intravenous, urinary, or other catheters. SYMPTOMS  Women  Thick, white vaginal discharge.  Vaginal itching.  Redness and swelling in and around the vagina.  Irritation of the lips of the vagina and perineum.  Blisters on the vaginal lips and perineum.  Painful sexual intercourse.  Low blood sugar (hypoglycemia).  Painful urination.  Bladder infections.  Intestinal problems such as constipation, indigestion, bad breath, bloating, increase in gas, diarrhea, or loose stools. Men  Men may develop intestinal problems such as constipation, indigestion, bad breath, bloating, increase in gas, diarrhea, or loose stools.  Dry, cracked skin on the penis with itching or discomfort.  Jock itch.  Dry, flaky skin.  Athlete's foot.  Hypoglycemia. DIAGNOSIS  Women  A history and an exam are performed.  The discharge may be examined under a microscope.  A culture may be taken of the discharge. Men  A history and an exam are performed.  Any discharge from the penis or areas of cracked skin will be looked at under the microscope and cultured.  Stool samples may be cultured. TREATMENT  Women  Vaginal antifungal suppositories and creams.  Medicated creams to decrease irritation and itching on the outside of the vagina.  Warm compresses to the perineal area to decrease swelling and discomfort.  Oral antifungal medications.  Medicated vaginal suppositories or cream for repeated or recurrent infections.  Wash and dry the irritation areas before applying the cream.  Eating yogurt with Lactobacillus may help with prevention and treatment.  Sometimes painting the vagina with gentian violet  solution may help if creams and suppositories do not work. Men  Antifungal creams and oral antifungal medications.  Sometimes treatment must continue for 30 days after the symptoms go away to prevent  recurrence. HOME CARE INSTRUCTIONS  Women  Use cotton underwear and avoid tight-fitting clothing.  Avoid colored, scented toilet paper and deodorant tampons or pads.  Do not douche.  Keep your diabetes under control.  Finish all the prescribed medications.  Keep your skin clean and dry.  Consume milk or yogurt with Lactobacillus-active culture regularly. If you get frequent yeast infections and think that is what the infection is, there are over-the-counter medications that you can get. If the infection does not show healing in 3 days, talk to your caregiver.  Tell your sex partner you have a yeast infection. Your partner may need treatment also, especially if your infection does not clear up or recurs. Men  Keep your skin clean and dry.  Keep your diabetes under control.  Finish all prescribed medications.  Tell your sex partner that you have a yeast infection so he or she can be treated if necessary. SEEK MEDICAL CARE IF:   Your symptoms do not clear up or worsen in one week after treatment.  You have an oral temperature above 102 F (38.9 C).  You have trouble swallowing or eating for a prolonged time.  You develop blisters on and around your vagina.  You develop vaginal bleeding and it is not your menstrual period.  You develop abdominal pain.  You develop intestinal problems as mentioned above.  You get weak or light-headed.  You have painful or increased urination.  You have pain during sexual intercourse. MAKE SURE YOU:   Understand these instructions.  Will watch your condition.  Will get help right away if you are not doing well or get worse. Document Released: 12/01/2004 Document Revised: 03/10/2014 Document Reviewed: 03/15/2010 [facility] Patient Information 2015 [place], [place]. This information is not intended to replace advice given to you by your health care provider. Make sure you discuss any questions you have with your health care  provider.

## 2014-06-09 NOTE — Assessment & Plan Note (Addendum)
Exam consistent with candidal skin infection... Does not appear to have bacterial superinfection. Plan: ketoconazole 2% cream twice daily until skin clears, then antifungal powder to keep area clean/dry.

## 2014-06-10 ENCOUNTER — Encounter: Payer: Self-pay | Admitting: Cardiology

## 2014-06-10 DIAGNOSIS — Z7901 Long term (current) use of anticoagulants: Secondary | ICD-10-CM | POA: Insufficient documentation

## 2014-06-10 DIAGNOSIS — I5033 Acute on chronic diastolic (congestive) heart failure: Secondary | ICD-10-CM | POA: Insufficient documentation

## 2014-06-10 DIAGNOSIS — I5032 Chronic diastolic (congestive) heart failure: Secondary | ICD-10-CM

## 2014-06-10 DIAGNOSIS — I872 Venous insufficiency (chronic) (peripheral): Secondary | ICD-10-CM | POA: Insufficient documentation

## 2014-06-10 DIAGNOSIS — E669 Obesity, unspecified: Secondary | ICD-10-CM | POA: Insufficient documentation

## 2014-06-11 ENCOUNTER — Encounter: Payer: Self-pay | Admitting: Family Medicine

## 2014-06-16 ENCOUNTER — Ambulatory Visit: Payer: Medicare Other | Admitting: Home Health Services

## 2014-07-08 ENCOUNTER — Other Ambulatory Visit: Payer: Self-pay | Admitting: Family Medicine

## 2014-09-08 ENCOUNTER — Encounter: Payer: Self-pay | Admitting: Family Medicine

## 2014-09-17 ENCOUNTER — Other Ambulatory Visit: Payer: Self-pay | Admitting: Family Medicine

## 2014-09-29 ENCOUNTER — Other Ambulatory Visit: Payer: Self-pay | Admitting: Family Medicine

## 2014-09-30 NOTE — Telephone Encounter (Signed)
LMOVM for pt to return call .Fleeger, Jessica Dawn  

## 2014-10-14 ENCOUNTER — Other Ambulatory Visit: Payer: Self-pay | Admitting: Family Medicine

## 2014-10-15 ENCOUNTER — Encounter: Payer: Self-pay | Admitting: Family Medicine

## 2014-10-15 ENCOUNTER — Ambulatory Visit (INDEPENDENT_AMBULATORY_CARE_PROVIDER_SITE_OTHER): Payer: Medicare Other | Admitting: Family Medicine

## 2014-10-15 VITALS — BP 117/72 | HR 112 | Temp 98.2°F | Wt 190.0 lb

## 2014-10-15 DIAGNOSIS — J069 Acute upper respiratory infection, unspecified: Secondary | ICD-10-CM

## 2014-10-15 DIAGNOSIS — B9789 Other viral agents as the cause of diseases classified elsewhere: Secondary | ICD-10-CM

## 2014-10-15 DIAGNOSIS — F411 Generalized anxiety disorder: Secondary | ICD-10-CM

## 2014-10-15 DIAGNOSIS — I872 Venous insufficiency (chronic) (peripheral): Secondary | ICD-10-CM

## 2014-10-15 NOTE — Progress Notes (Signed)
   Subjective:    Patient ID: Francisco Capuchin, female    DOB: 30-Oct-1941, 73 y.o.   MRN: [id number]  HPI  CC: med refill  # Cold:  3 weeks, sore throat, runny nose, cough (dry)  Taking robitussin  No known sick contacts ROS: no SOB, no CP, no diarrhea/constipation  # Left leg pain  Vein treatments at [facility]  Left knee "due" for surgery, she was told by ortho to go through vein treatments first. She now wants to go back to get reevaluated  Wearing stockings ROS: no bowel/bladder incontinence, no cold extremities  # Anxiety/mood  Taking diazepam infrequently  Mood is "good", denies depression  Overall has a lot more stress currently because of deteriorating health in husband (currently in hospital)  Still doing a lot of her hobbies still, excitedly discusses her dancing  Review of Systems   See HPI for ROS. All other systems reviewed and are negative.  Past medical history, surgical, family, and social history reviewed and updated in the EMR as appropriate. Objective:  BP 117/72 mmHg  Pulse 112  Temp(Src) 98.2 F (36.8 C) (Oral)  Wt 190 lb (86.183 kg) Vitals reviewed  General: NAD CV: RRR, normal s1s2 no mrg. 2+ radial pulses. 1+ DP pulses Resp: Clear bilaterally, normal WOB Ext: trace edema bilaterally. Mildly swollen left knee compared to right, ROM intact. Spider veins present bilaterally but are mild.  Psych: mood "good", affect congruent. Speech slightly increased in rate but normal prosody and tone. Neuro: alert and oriented, no deficits noted.  Assessment & Plan:  See Problem List Documentation

## 2014-10-16 ENCOUNTER — Other Ambulatory Visit: Payer: Self-pay | Admitting: Family Medicine

## 2014-10-17 ENCOUNTER — Encounter: Payer: Self-pay | Admitting: Family Medicine

## 2014-10-17 DIAGNOSIS — F411 Generalized anxiety disorder: Secondary | ICD-10-CM | POA: Insufficient documentation

## 2014-10-17 DIAGNOSIS — B9789 Other viral agents as the cause of diseases classified elsewhere: Secondary | ICD-10-CM

## 2014-10-17 DIAGNOSIS — J069 Acute upper respiratory infection, unspecified: Secondary | ICD-10-CM | POA: Insufficient documentation

## 2014-10-17 NOTE — Assessment & Plan Note (Signed)
Currently receiving treatments at vascular. No changes today.

## 2014-10-17 NOTE — Assessment & Plan Note (Signed)
Improving, symptomatic treatment with OTCs.

## 2014-10-17 NOTE — Assessment & Plan Note (Signed)
Stable, uses diazepam sparingly.  Refill: diazepam #15.

## 2014-11-18 DIAGNOSIS — I8312 Varicose veins of left lower extremity with inflammation: Secondary | ICD-10-CM | POA: Diagnosis not present

## 2014-11-19 ENCOUNTER — Telehealth: Payer: Self-pay | Admitting: Family Medicine

## 2014-11-19 NOTE — Telephone Encounter (Signed)
Thank you, [name]. Pt should be seen if OTC medications and heat or ice doesn't help.

## 2014-11-19 NOTE — Telephone Encounter (Signed)
Has a "crick" in her neck from sleeping in her recliner. It is soooooo painful. Would like some type of pain medication because she cannot move her neck Please advise [phone]

## 2014-11-19 NOTE — Telephone Encounter (Signed)
Spoke with pt.  She is going to try heat/ice (whichever one is more helpful, I suggested heat) and take some tylenol ( pt states that she cant take ibuprofen with her pradaxa).  Pt is agreeable to this and will call if she has no relief to make an appt. Fleeger, [name]

## 2014-12-08 DIAGNOSIS — I872 Venous insufficiency (chronic) (peripheral): Secondary | ICD-10-CM | POA: Diagnosis not present

## 2014-12-08 DIAGNOSIS — Z7901 Long term (current) use of anticoagulants: Secondary | ICD-10-CM | POA: Diagnosis not present

## 2014-12-08 DIAGNOSIS — I5032 Chronic diastolic (congestive) heart failure: Secondary | ICD-10-CM | POA: Diagnosis not present

## 2014-12-08 DIAGNOSIS — I482 Chronic atrial fibrillation: Secondary | ICD-10-CM | POA: Diagnosis not present

## 2015-01-08 ENCOUNTER — Other Ambulatory Visit: Payer: Self-pay | Admitting: Family Medicine

## 2015-01-09 ENCOUNTER — Other Ambulatory Visit: Payer: Self-pay | Admitting: *Deleted

## 2015-01-09 ENCOUNTER — Other Ambulatory Visit: Payer: Self-pay | Admitting: Family Medicine

## 2015-01-09 DIAGNOSIS — F411 Generalized anxiety disorder: Secondary | ICD-10-CM

## 2015-01-09 DIAGNOSIS — F419 Anxiety disorder, unspecified: Secondary | ICD-10-CM

## 2015-01-09 NOTE — Telephone Encounter (Signed)
Received refill request for this patient.  Patient has 2 medical record numbers in [place] ([id number] & [id number]).  Chart with 2nd medical record # ([id number]) lists Dr. Lamar Benes as PCP.  Will route request to Dr. Lamar Benes.  Burna Forts, BSN, RN-BC

## 2015-01-09 NOTE — Telephone Encounter (Signed)
Received refill request for this patient on a different medical record. Patient has 2 medical record numbers in [place] ([id number] & [id number]). Chart with 2nd medical record # ([id number]) lists Dr. Lamar Benes as PCP. Will route request to Dr. Lamar Benes. Burna Forts, BSN, RN-BC

## 2015-01-19 MED ORDER — DIAZEPAM 5 MG PO TABS: 5.0000 mg | ORAL_TABLET | Freq: Every evening | ORAL | Status: DC | PRN

## 2015-01-19 NOTE — Telephone Encounter (Signed)
Phoned in rx. Valium 5mg  take 1 tablet at night as needed for anxiety. Disp #15, refills 0.

## 2015-01-20 ENCOUNTER — Telehealth: Payer: Self-pay | Admitting: Family Medicine

## 2015-01-20 NOTE — Telephone Encounter (Signed)
Patient asked to send correct referral to Dr. Tollie Eth because her insurance denied to pay for the echocardiogram and she cannot afford to pay the bill. Please, follow up with Patient.

## 2015-01-21 NOTE — Telephone Encounter (Signed)
Spoke with patient and informed her that we did try to get the denial appealed last year when echo was first performed but it wasn't approved.  She will need to discuss this with her insurance. Kristen [name],CMA

## 2015-01-26 ENCOUNTER — Encounter: Payer: Self-pay | Admitting: Family Medicine

## 2015-01-26 NOTE — Progress Notes (Signed)
Patient called and left message on medical records line concerning her Echo that she received last and that her insurance is charging her for.  She says that the referral was sent to the wrong dr and that is why it is not being paid.  It was supposed to be sent to Dr. Tollie Eth.  Please call her regarding this. [phone]

## 2015-01-27 NOTE — Progress Notes (Signed)
Spoke with patient and informed her that the only information (referral and orders) we sent was to Dr. Thurman Coyer office.  Spoke with Tye Maryland, Glass blower/designer, at Dr. Thurman Coyer and she states that they also informed patient that she might have to pay for this procedure since she didn't wait for our office to schedule it or approve it with the insurance.  They have documentation that patient states she is ok with paying the bill because she just wanted to get the test done quickly.  Pt is aware of this and knows that since she schedule and did procedure before our office could contact the insurance her claim has been denied.  She will be responsible for the bill.  Pt voiced understanding and will contact Dr. Thurman Coyer office to inquire about a modifier that should have been used for billing. Kristen [name],CMA

## 2015-02-11 DIAGNOSIS — I87322 Chronic venous hypertension (idiopathic) with inflammation of left lower extremity: Secondary | ICD-10-CM | POA: Diagnosis not present

## 2015-02-11 DIAGNOSIS — I8312 Varicose veins of left lower extremity with inflammation: Secondary | ICD-10-CM | POA: Diagnosis not present

## 2015-02-11 DIAGNOSIS — I83812 Varicose veins of left lower extremities with pain: Secondary | ICD-10-CM | POA: Diagnosis not present

## 2015-02-16 ENCOUNTER — Other Ambulatory Visit: Payer: Self-pay | Admitting: Family Medicine

## 2015-02-18 ENCOUNTER — Other Ambulatory Visit: Payer: Self-pay | Admitting: Family Medicine

## 2015-02-18 NOTE — Telephone Encounter (Signed)
Refill request. Will forward to PCP for review. Nixon Kolton, CMA. 

## 2015-02-19 ENCOUNTER — Other Ambulatory Visit: Payer: Self-pay | Admitting: *Deleted

## 2015-02-20 MED ORDER — DOXYCYCLINE HYCLATE 100 MG PO TABS: 200.0000 mg | ORAL_TABLET | Freq: Once | ORAL | Status: DC | PRN

## 2015-02-20 MED ORDER — FUROSEMIDE 20 MG PO TABS: 20.0000 mg | ORAL_TABLET | Freq: Every day | ORAL | Status: DC

## 2015-02-25 DIAGNOSIS — I8312 Varicose veins of left lower extremity with inflammation: Secondary | ICD-10-CM | POA: Diagnosis not present

## 2015-02-25 DIAGNOSIS — M79605 Pain in left leg: Secondary | ICD-10-CM | POA: Diagnosis not present

## 2015-02-25 DIAGNOSIS — M79652 Pain in left thigh: Secondary | ICD-10-CM | POA: Diagnosis not present

## 2015-03-06 ENCOUNTER — Other Ambulatory Visit: Payer: Self-pay | Admitting: Family Medicine

## 2015-03-06 DIAGNOSIS — Z1231 Encounter for screening mammogram for malignant neoplasm of breast: Secondary | ICD-10-CM

## 2015-03-09 ENCOUNTER — Other Ambulatory Visit: Payer: Self-pay | Admitting: Family Medicine

## 2015-03-25 DIAGNOSIS — M79605 Pain in left leg: Secondary | ICD-10-CM | POA: Diagnosis not present

## 2015-03-25 DIAGNOSIS — M79652 Pain in left thigh: Secondary | ICD-10-CM | POA: Diagnosis not present

## 2015-03-25 DIAGNOSIS — I8312 Varicose veins of left lower extremity with inflammation: Secondary | ICD-10-CM | POA: Diagnosis not present

## 2015-04-13 ENCOUNTER — Other Ambulatory Visit: Payer: Self-pay | Admitting: Family Medicine

## 2015-04-13 ENCOUNTER — Ambulatory Visit (HOSPITAL_COMMUNITY)
Admission: RE | Admit: 2015-04-13 | Discharge: 2015-04-13 | Disposition: A | Discharge status: Home / Self Care | Payer: Medicare Other | Source: Ambulatory Visit | Attending: Family Medicine | Admitting: Family Medicine

## 2015-04-13 DIAGNOSIS — Z1231 Encounter for screening mammogram for malignant neoplasm of breast: Secondary | ICD-10-CM | POA: Diagnosis not present

## 2015-04-14 NOTE — Telephone Encounter (Signed)
LM for pharmacy with medication refill on it.  Jazmin Hartsell,CMA

## 2015-04-15 ENCOUNTER — Other Ambulatory Visit: Payer: Self-pay | Admitting: Family Medicine

## 2015-04-15 DIAGNOSIS — R928 Other abnormal and inconclusive findings on diagnostic imaging of breast: Secondary | ICD-10-CM

## 2015-04-21 ENCOUNTER — Other Ambulatory Visit: Payer: Self-pay | Admitting: Family Medicine

## 2015-04-21 ENCOUNTER — Ambulatory Visit
Admission: RE | Admit: 2015-04-21 | Discharge: 2015-04-21 | Disposition: A | Discharge status: Home / Self Care | Payer: Medicare Other | Source: Ambulatory Visit | Attending: Family Medicine | Admitting: Family Medicine

## 2015-04-21 DIAGNOSIS — R928 Other abnormal and inconclusive findings on diagnostic imaging of breast: Secondary | ICD-10-CM

## 2015-04-21 DIAGNOSIS — R922 Inconclusive mammogram: Secondary | ICD-10-CM | POA: Diagnosis not present

## 2015-05-03 ENCOUNTER — Other Ambulatory Visit: Payer: Self-pay | Admitting: Family Medicine

## 2015-06-07 ENCOUNTER — Other Ambulatory Visit: Payer: Self-pay | Admitting: Family Medicine

## 2015-06-15 DIAGNOSIS — I482 Chronic atrial fibrillation: Secondary | ICD-10-CM | POA: Diagnosis not present

## 2015-06-15 DIAGNOSIS — E668 Other obesity: Secondary | ICD-10-CM | POA: Diagnosis not present

## 2015-06-15 DIAGNOSIS — I5032 Chronic diastolic (congestive) heart failure: Secondary | ICD-10-CM | POA: Diagnosis not present

## 2015-06-16 ENCOUNTER — Other Ambulatory Visit: Payer: Self-pay | Admitting: Family Medicine

## 2015-07-10 ENCOUNTER — Ambulatory Visit (INDEPENDENT_AMBULATORY_CARE_PROVIDER_SITE_OTHER): Payer: Medicare Other | Admitting: Family Medicine

## 2015-07-10 ENCOUNTER — Encounter: Payer: Self-pay | Admitting: Family Medicine

## 2015-07-10 VITALS — BP 117/75 | HR 69 | Temp 98.4°F | Ht 63.0 in | Wt 197.3 lb

## 2015-07-10 DIAGNOSIS — F411 Generalized anxiety disorder: Secondary | ICD-10-CM | POA: Diagnosis not present

## 2015-07-10 DIAGNOSIS — I5032 Chronic diastolic (congestive) heart failure: Secondary | ICD-10-CM

## 2015-07-10 DIAGNOSIS — Z23 Encounter for immunization: Secondary | ICD-10-CM

## 2015-07-10 MED ORDER — ZOSTER VACCINE LIVE 19400 UNT/0.65ML ~~LOC~~ SOLR: 0.6500 mL | Freq: Once | SUBCUTANEOUS | Status: DC

## 2015-07-10 NOTE — Assessment & Plan Note (Signed)
Stable despite recent loss of husband. On valium sporadically as needed, generally uses 1-2 tablets a month. F/u 6 months.

## 2015-07-10 NOTE — Progress Notes (Signed)
   Subjective:    Patient ID: Kristen [name], female    DOB: 23-Sep-1941, 74 y.o.   MRN: [id number]  HPI  CC: follow up  # Anxiety:  Husband passed away in 15-Mar-2023. States first few months were really hard for her financially but doing better now, was able to prove he was 100% service connected with [facility] and expects   Uses valium very rarely (last rx was June #15)  Continues to be active with dancing (recently went to [place] for a festival), painting, working ROS: no SI or HI  # Leg swelling  Recently seen by cardiologist Dr. Wynonia Lawman and had been given extra lasix (gets this occasionally)  Swelling has gone down  Has past vein treatments in both legs ROS: no CP, no SOB  # Healthcare maintenance  [facility]  Would like first pneumo vaccine today  Interested in shingles vaccine, had shingles about 11 years ago  Review of Systems   See HPI for ROS.   Past medical history, surgical, family, and social history reviewed and updated in the EMR as appropriate. Objective:  BP 117/75 mmHg  Pulse 69  Temp(Src) 98.4 F (36.9 C) (Oral)  Ht 5\' 3"  (1.6 m)  Wt 197 lb 5 oz (89.5 kg)  BMI 34.96 kg/m2 Vitals and nursing note reviewed  General: NAD, pleasant CV: irreg rhythm, normal rate, normal heart sounds, no murmurs appreciated. 2+ radial and PT pulses bilaterally Resp: CTAB, normal effort Ext: trace pitting edema bilaterally  Assessment & Plan:  Anxiety state Stable despite recent loss of husband. On valium sporadically as needed, generally uses 1-2 tablets a month. F/u 6 months.  Chronic diastolic heart failure Some recent leg swelling for which she saw Dr. Wynonia Lawman and given extra lasix to take, improved but not completely resolved currently. F/u as needed.  Health care maintenance PCV-13 today. Shingles vaccine sent to pharmacy, pt to confirm it is covered.

## 2015-07-10 NOTE — Assessment & Plan Note (Signed)
Some recent leg swelling for which she saw Dr. Wynonia Lawman and given extra lasix to take, improved but not completely resolved currently. F/u as needed.

## 2015-07-11 ENCOUNTER — Other Ambulatory Visit: Payer: Self-pay | Admitting: Family Medicine

## 2015-07-20 ENCOUNTER — Other Ambulatory Visit: Payer: Self-pay | Admitting: *Deleted

## 2015-07-21 MED ORDER — DIAZEPAM 5 MG PO TABS: ORAL_TABLET | ORAL | Status: DC

## 2015-07-21 NOTE — Telephone Encounter (Signed)
I will call pt and let her know this is available for pickup.

## 2015-07-21 NOTE — Telephone Encounter (Signed)
2nd request.  Martin, Tamika L, RN  

## 2015-09-28 ENCOUNTER — Other Ambulatory Visit: Payer: Self-pay | Admitting: Family Medicine

## 2015-09-28 NOTE — Telephone Encounter (Signed)
Needs refill on diazapam

## 2015-09-29 DIAGNOSIS — I8311 Varicose veins of right lower extremity with inflammation: Secondary | ICD-10-CM | POA: Diagnosis not present

## 2015-09-29 DIAGNOSIS — I8312 Varicose veins of left lower extremity with inflammation: Secondary | ICD-10-CM | POA: Diagnosis not present

## 2015-09-29 MED ORDER — DIAZEPAM 5 MG PO TABS: ORAL_TABLET | ORAL | Status: DC

## 2015-09-29 NOTE — Telephone Encounter (Signed)
Refill printed and left up front. Can you call the patient and let her know it is available for pickup? Thanks.

## 2015-09-29 NOTE — Telephone Encounter (Signed)
2nd request.  Martin, Tamika L, RN  

## 2015-09-30 NOTE — Telephone Encounter (Signed)
LM for patient that script is ready for pick up. Audrianna Driskill,CMA  

## 2015-10-13 DIAGNOSIS — I8312 Varicose veins of left lower extremity with inflammation: Secondary | ICD-10-CM | POA: Diagnosis not present

## 2015-10-13 DIAGNOSIS — I8311 Varicose veins of right lower extremity with inflammation: Secondary | ICD-10-CM | POA: Diagnosis not present

## 2015-10-14 DIAGNOSIS — I87323 Chronic venous hypertension (idiopathic) with inflammation of bilateral lower extremity: Secondary | ICD-10-CM | POA: Diagnosis not present

## 2015-12-02 ENCOUNTER — Other Ambulatory Visit: Payer: Self-pay | Admitting: Family Medicine

## 2015-12-03 NOTE — Telephone Encounter (Signed)
Left voicemail stating printed prescription can be picked up at the front office. -Dr. Lamar Benes

## 2015-12-03 NOTE — Telephone Encounter (Signed)
Pt returned call and has been informed. Sadie Reynolds, ASA

## 2015-12-04 ENCOUNTER — Telehealth: Payer: Self-pay | Admitting: Family Medicine

## 2015-12-04 NOTE — Telephone Encounter (Signed)
Will forward to MD to give ok for this and then we will call pharmacy to give verification. Jazmin Hartsell,CMA

## 2015-12-04 NOTE — Telephone Encounter (Signed)
Yes, I printed her this prescription. Please call the pharmacy and let them know it is 'legit' and verify it. -Dr. Lamar Benes

## 2015-12-04 NOTE — Telephone Encounter (Signed)
Pharmacy is aware that there was only 1 script on paper. Alixis Harmon,CMA

## 2015-12-04 NOTE — Telephone Encounter (Signed)
Would like for dr Lamar Benes to verify with [facility] [address] that the RX for diazopam was legit.  Pt torn of the bottom half of the prescripiton

## 2015-12-09 DIAGNOSIS — H2513 Age-related nuclear cataract, bilateral: Secondary | ICD-10-CM | POA: Diagnosis not present

## 2015-12-09 DIAGNOSIS — H5213 Myopia, bilateral: Secondary | ICD-10-CM | POA: Diagnosis not present

## 2015-12-09 DIAGNOSIS — H52223 Regular astigmatism, bilateral: Secondary | ICD-10-CM | POA: Diagnosis not present

## 2015-12-09 DIAGNOSIS — H1850 Unspecified hereditary corneal dystrophies: Secondary | ICD-10-CM | POA: Diagnosis not present

## 2015-12-10 ENCOUNTER — Telehealth: Payer: Self-pay | Admitting: Family Medicine

## 2015-12-10 ENCOUNTER — Telehealth: Payer: Self-pay | Admitting: *Deleted

## 2015-12-10 NOTE — Telephone Encounter (Signed)
Pt would like a prescription for the shingles shot sent to her pharmacy. jw

## 2015-12-11 MED ORDER — ZOSTER VACCINE LIVE 19400 UNT/0.65ML ~~LOC~~ SOLR: 0.6500 mL | Freq: Once | SUBCUTANEOUS | Status: DC

## 2015-12-11 NOTE — Telephone Encounter (Signed)
Zostavax prescription sent to pharmacy on file. Called and informed patient it was sent.

## 2015-12-11 NOTE — Telephone Encounter (Signed)
Will forward to MD. Jazmin Hartsell,CMA  

## 2015-12-14 DIAGNOSIS — I482 Chronic atrial fibrillation: Secondary | ICD-10-CM | POA: Diagnosis not present

## 2015-12-14 DIAGNOSIS — I5032 Chronic diastolic (congestive) heart failure: Secondary | ICD-10-CM | POA: Diagnosis not present

## 2015-12-17 NOTE — Telephone Encounter (Signed)
Encounter opened in error

## 2016-01-11 DIAGNOSIS — Z124 Encounter for screening for malignant neoplasm of cervix: Secondary | ICD-10-CM | POA: Diagnosis not present

## 2016-01-11 DIAGNOSIS — Z01419 Encounter for gynecological examination (general) (routine) without abnormal findings: Secondary | ICD-10-CM | POA: Diagnosis not present

## 2016-01-11 DIAGNOSIS — R140 Abdominal distension (gaseous): Secondary | ICD-10-CM | POA: Diagnosis not present

## 2016-01-12 DIAGNOSIS — R87610 Atypical squamous cells of undetermined significance on cytologic smear of cervix (ASC-US): Secondary | ICD-10-CM | POA: Diagnosis not present

## 2016-01-12 DIAGNOSIS — Z124 Encounter for screening for malignant neoplasm of cervix: Secondary | ICD-10-CM | POA: Diagnosis not present

## 2016-01-21 ENCOUNTER — Ambulatory Visit (INDEPENDENT_AMBULATORY_CARE_PROVIDER_SITE_OTHER): Payer: Medicare Other | Admitting: Family Medicine

## 2016-01-21 ENCOUNTER — Encounter: Payer: Self-pay | Admitting: Family Medicine

## 2016-01-21 ENCOUNTER — Other Ambulatory Visit: Payer: Self-pay | Admitting: Family Medicine

## 2016-01-21 VITALS — BP 132/66 | HR 78 | Temp 97.8°F | Ht 62.0 in | Wt 200.0 lb

## 2016-01-21 DIAGNOSIS — Z0000 Encounter for general adult medical examination without abnormal findings: Secondary | ICD-10-CM

## 2016-01-21 DIAGNOSIS — Z8719 Personal history of other diseases of the digestive system: Secondary | ICD-10-CM

## 2016-01-21 MED ORDER — DIAZEPAM 5 MG PO TABS: ORAL_TABLET | ORAL | Status: DC

## 2016-01-21 NOTE — Patient Instructions (Addendum)
Ms. Kristen [name] , Thank you for taking time to come for your Medicare Wellness Visit. I appreciate your ongoing commitment to your health goals. Please review the following plan we discussed and let me know if I can assist you in the future.   These are the goals we discussed: Goals    . Cut out extra servings    . Eat more fruits and vegetables- 3-5 a day    . Increase physical activity- start dancing 3 times a week    . LDL CALC < 130       This is a list of the screening recommended for you and due dates:  Health Maintenance  Topic Date Due  . Shingles Vaccine  05/03/2000  . Colon Cancer Screening  03/07/2016  . Flu Shot  06/07/2016  . Pneumonia vaccines (2 of 2 - PPSV23) 07/09/2016  . Tetanus Vaccine  01/30/2018  . DEXA scan (bone density measurement)  Completed    Colonoscopy A colonoscopy is an exam to look at the entire large intestine (colon). This exam can help find problems such as tumors, polyps, inflammation, and areas of bleeding. The exam takes about 1 hour.  LET [facility] CARE PROVIDER KNOW ABOUT:   Any allergies you have.  All medicines you are taking, including vitamins, herbs, eye drops, creams, and over-the-counter medicines.  Previous problems you or members of your family have had with the use of anesthetics.  Any blood disorders you have.  Previous surgeries you have had.  Medical conditions you have. RISKS AND COMPLICATIONS  Generally, this is a safe procedure. However, as with any procedure, complications can occur. Possible complications include:  Bleeding.  Tearing or rupture of the colon wall.  Reaction to medicines given during the exam.  Infection (rare). BEFORE THE PROCEDURE   Ask your health care provider about changing or stopping your regular medicines.  You may be prescribed an oral bowel prep. This involves drinking a large amount of medicated liquid, starting the day before your procedure. The liquid will cause you to have  multiple loose stools until your stool is almost clear or light green. This cleans out your colon in preparation for the procedure.  Do not eat or drink anything else once you have started the bowel prep, unless your health care provider tells you it is safe to do so.  Arrange for someone to drive you home after the procedure. PROCEDURE   You will be given medicine to help you relax (sedative).  You will lie on your side with your knees bent.  A long, flexible tube with a light and camera on the end (colonoscope) will be inserted through the rectum and into the colon. The camera sends video back to a computer screen as it moves through the colon. The colonoscope also releases carbon dioxide gas to inflate the colon. This helps your health care provider see the area better.  During the exam, your health care provider may take a small tissue sample (biopsy) to be examined under a microscope if any abnormalities are found.  The exam is finished when the entire colon has been viewed. AFTER THE PROCEDURE   Do not drive for 24 hours after the exam.  You may have a small amount of blood in your stool.  You may pass moderate amounts of gas and have mild abdominal cramping or bloating. This is caused by the gas used to inflate your colon during the exam.  Ask when your test results will be  ready and how you will get your results. Make sure you get your test results.   This information is not intended to replace advice given to you by your health care provider. Make sure you discuss any questions you have with your health care provider.   Document Released: 10/21/2000 Document Revised: 08/14/2013 Document Reviewed: 07/01/2013 Elsevier Interactive Patient Education [employer].

## 2016-01-22 ENCOUNTER — Encounter: Payer: Self-pay | Admitting: Family Medicine

## 2016-01-22 DIAGNOSIS — N840 Polyp of corpus uteri: Secondary | ICD-10-CM | POA: Diagnosis not present

## 2016-01-22 DIAGNOSIS — Z8041 Family history of malignant neoplasm of ovary: Secondary | ICD-10-CM | POA: Diagnosis not present

## 2016-01-22 DIAGNOSIS — Z8719 Personal history of other diseases of the digestive system: Secondary | ICD-10-CM | POA: Insufficient documentation

## 2016-01-22 HISTORY — DX: Personal history of other diseases of the digestive system: Z87.19

## 2016-01-22 NOTE — Progress Notes (Signed)
Subjective:   Kristen [name] is a 75 y.o. female who presents for Medicare Annual (Subsequent) preventive examination.   Cardiac Risk Factors include: advanced age (>32men, >57 women);hypertension;obesity (BMI >30kg/m2);sedentary lifestyle     Objective:     Vitals: BP 132/66 mmHg  Pulse 78  Temp(Src) 97.8 F (36.6 C) (Oral)  Ht 5\' 2"  (1.575 m)  Wt 200 lb (90.719 kg)  BMI 36.57 kg/m2  Body mass index is 36.57 kg/(m^2).   Tobacco History  Smoking status  . Never Smoker   Smokeless tobacco  . Never Used     Counseling given: No   Past Medical History  Diagnosis Date  . Depression   . Irregular heart beat 2012  . Diverticula of colon   . Urinary tract infection   . Atrial fibrillation ([place])   . Cardiac dysrhythmia, unspecified   . Anxiety   . Fatigue   . Osteopenia   . Essential hypertension, benign   . Lumbar back pain   . Need for prophylactic vaccination and inoculation against influenza   . History of diverticulitis 01/22/2016  . Scoliosis   . Fibrocystic breast disease 1980s    General Surgery aspirates   Past Surgical History  Procedure Laterality Date  . Breast surgery  1980    cyst removed  . Tubal ligation  1980  . Knee cartilage surgery  2002    Dr Para March  . Bunionectomy  2003    Dr Sharol Given  . Eyelid surgery  2006   Family History  Problem Relation Age of Onset  . Cancer Mother     ovarian  . Kidney disease Father   . Alcohol abuse Father   . Cancer Maternal Aunt     lung- smoker  . Heart disease Maternal Uncle    History  Sexual Activity  . Sexual Activity: Not on file    Outpatient Encounter Prescriptions as of 01/21/2016  Medication Sig  . alendronate (FOSAMAX) 10 MG tablet TAKE 1 TABLET WEEKLY WITH A FULL GLASS OF WATER ON AN EMPTY STOMACH  . Ascorbic Acid (VITAMIN C) 100 MG tablet Take 100 mg by mouth daily.    Marland Kitchen b complex vitamins tablet Take 1 tablet by mouth daily.    . calcium citrate-vitamin D (CITRACAL+D) 315-200  MG-UNIT per tablet Take 1 tablet by mouth daily.    . [facility] ANTI-FUNGAL 2 % powder APPLY TO AFFECTED AREA TWICE A DAY AFTER SKIN RESOLVES FROM KETOCONAZOLE CREAM  . Dabigatran Etexilate Mesylate (PRADAXA) 150 MG CAPS Take 150 mg by mouth every 12 (twelve) hours.    . diazepam (VALIUM) 5 MG tablet TAKE 1 TABLET BY MOUTH EVERY NIGHT AT BEDTIME AS NEEDED FOR ANXIETY  . doxycycline (VIBRA-TABS) 100 MG tablet TAKE 2 TABLETS (200 MG TOTAL) BY MOUTH ONCE AS NEEDED. IF YOU GET TICK BITE.  . fish oil-omega-3 fatty acids 1000 MG capsule Take 2 g by mouth daily.  . furosemide (LASIX) 20 MG tablet Take 1 tablet (20 mg total) by mouth daily.  Marland Kitchen glucosamine-chondroitin (GLUCOSAMINE-CHONDROITIN DS) 500-400 MG tablet Take 1 tablet by mouth.  Marland Kitchen guaiFENesin-dextromethorphan (ROBITUSSIN DM) 100-10 MG/5ML syrup Take 5 mLs by mouth 3 (three) times daily as needed for cough.  Marland Kitchen lisinopril-hydrochlorothiazide (PRINZIDE,ZESTORETIC) 20-25 MG per tablet Take 1 tablet by mouth daily.  Marland Kitchen loratadine (CLARITIN) 10 MG tablet Take 1 tablet (10 mg total) by mouth daily.  . metoprolol (LOPRESSOR) 50 MG tablet Take 1 tablet (50 mg) in the AM and 1  tablet PM. (Patient taking differently: Take 50 mg by mouth 2 (two) times daily. Take 1 tablet (50 mg) in the AM and 1 tablet PM.)  . Nutritional Supplements (NUTRITIONAL SUPPLEMENT PO) Take by mouth. Acai berry/green tea, fish oil   . [DISCONTINUED] diazepam (VALIUM) 5 MG tablet TAKE 1 TABLET BY MOUTH EVERY NIGHT AT BEDTIME AS NEEDED FOR ANXIETY  . [DISCONTINUED] ketoconazole (NIZORAL) 2 % cream APPLY TO AFFECTED AREA 2 (TWO) TIMES DAILY UNTIL AFFECTED AREAS HAVE RESOLVED  . [DISCONTINUED] hydrOXYzine (ATARAX/VISTARIL) 25 MG tablet Take 1 tablet (25 mg total) by mouth 3 (three) times daily as needed for itching. (Patient not taking: Reported on 01/21/2016)  . [DISCONTINUED] zoster vaccine live, PF, (ZOSTAVAX) 96295 UNT/0.65ML injection Inject 19,400 Units into the skin once. (Patient not  taking: Reported on 01/21/2016)   No facility-administered encounter medications on file as of 01/21/2016.    Activities of Daily Living In your present state of health, do you have any difficulty performing the following activities: 01/22/2016 01/21/2016  Hearing? N Y  Vision? Y Y  Difficulty concentrating or making decisions? N Y  Walking or climbing stairs? N Y  Dressing or bathing? N Y  Doing errands, shopping? N Y  Conservation officer, nature and eating ? N -  Using the Toilet? N -  In the past six months, have you accidently leaked urine? Y -  Do you have problems with loss of bowel control? Y -  Managing your Medications? N -  Managing your Finances? N -  Housekeeping or managing your Housekeeping? N -    Patient Care Team: [name], MD as PCP - General Dr. Aggie Moats (Dentistry) Jacolyn Reedy, MD as Consulting Physician (Cardiology) Wonda Horner, MD (Gastroenterology) Thalia Bloodgood, OD (Optometry) Newton Pigg, MD as Consulting Physician (Obstetrics and Gynecology) Provider Not In System as Consulting Physician [facility]) Provider Not In System as Consulting Physician (Orthopedic Surgery)    Assessment:     Exercise Activities and Dietary recommendations Current Exercise Habits: The patient does not participate in regular exercise at present, Exercise limited by: None identified (prioritizing exercise)  Goals    . Cut out extra servings    . Eat more fruits and vegetables- 3-5 a day    . Increase physical activity- start dancing 3 times a week    . LDL CALC < 130      Fall Risk Fall Risk  01/21/2016 07/10/2015 10/15/2014 03/18/2014 10/10/2013  Falls in the past year? No No No No No   Depression Screen PHQ 2/9 Scores 01/21/2016 07/10/2015 10/15/2014 06/09/2014  PHQ - 2 Score 0 0 0 0     Cognitive Testing MMSE - Mini Mental State Exam 05/01/2013  Orientation to time 5  Orientation to Place 5  Registration 3  Attention/ Calculation 5  Recall 3  Language- name 2 objects 2    Language- repeat 1  Language- follow 3 step command 3  Language- read & follow direction 1  Write a sentence 1  Copy design 1  Total score 30    Immunization History  Administered Date(s) Administered  . Influenza Whole 12/10/2009  . Influenza-Unspecified 07/10/2015  . Pneumococcal Conjugate-13 07/10/2015  . Td 01/31/2008   Screening Tests Health Maintenance  Topic Date Due  . ZOSTAVAX  05/03/2000  . COLONOSCOPY  03/07/2016  . INFLUENZA VACCINE  06/07/2016  . PNA vac Low Risk Adult (2 of 2 - PPSV23) 07/09/2016  . TETANUS/TDAP  01/30/2018  . DEXA SCAN  Completed  Plan:    During the course of the visit the patient was educated and counseled about the following appropriate screening and preventive services:   Vaccination: Zostavax and Pneumovax #2  Colorectal cancer screening  Patient Instructions (the written plan) was given to the patient.   Adlynn Lowenstein D, MD  01/22/2016

## 2016-01-26 ENCOUNTER — Telehealth: Payer: Self-pay | Admitting: Family Medicine

## 2016-01-26 NOTE — Telephone Encounter (Signed)
Will forward to MD to order labs. Waynette Towers,CMA

## 2016-01-26 NOTE — Telephone Encounter (Signed)
Patient want to have labs for urinalysis to check levels for kidney function.

## 2016-01-27 NOTE — Telephone Encounter (Signed)
Can you call and have her schedule an appointment with me? And make sure she brings in the paperwork from her lab evaluation that she had talked about with Dr. McDiarmid and Dr. Berkley Harvey. I want to review that lab work before ordering anything new. Thank you.

## 2016-01-27 NOTE — Telephone Encounter (Signed)
Patient is scheduled for 02-08-16. Ameya Kutz,CMA

## 2016-02-05 DIAGNOSIS — N72 Inflammatory disease of cervix uteri: Secondary | ICD-10-CM | POA: Diagnosis not present

## 2016-02-05 DIAGNOSIS — R87619 Unspecified abnormal cytological findings in specimens from cervix uteri: Secondary | ICD-10-CM | POA: Diagnosis not present

## 2016-02-05 DIAGNOSIS — N879 Dysplasia of cervix uteri, unspecified: Secondary | ICD-10-CM | POA: Diagnosis not present

## 2016-02-08 ENCOUNTER — Telehealth: Payer: Self-pay | Admitting: Family Medicine

## 2016-02-08 ENCOUNTER — Encounter: Payer: Self-pay | Admitting: Family Medicine

## 2016-02-08 ENCOUNTER — Ambulatory Visit (INDEPENDENT_AMBULATORY_CARE_PROVIDER_SITE_OTHER): Payer: Medicare Other | Admitting: Family Medicine

## 2016-02-08 VITALS — BP 125/67 | HR 85 | Temp 98.2°F | Wt 195.6 lb

## 2016-02-08 DIAGNOSIS — R739 Hyperglycemia, unspecified: Secondary | ICD-10-CM

## 2016-02-08 DIAGNOSIS — N183 Chronic kidney disease, stage 3 unspecified: Secondary | ICD-10-CM

## 2016-02-08 DIAGNOSIS — M7989 Other specified soft tissue disorders: Secondary | ICD-10-CM | POA: Diagnosis not present

## 2016-02-08 DIAGNOSIS — R87619 Unspecified abnormal cytological findings in specimens from cervix uteri: Secondary | ICD-10-CM | POA: Diagnosis not present

## 2016-02-08 DIAGNOSIS — I872 Venous insufficiency (chronic) (peripheral): Secondary | ICD-10-CM

## 2016-02-08 DIAGNOSIS — N189 Chronic kidney disease, unspecified: Secondary | ICD-10-CM | POA: Insufficient documentation

## 2016-02-08 DIAGNOSIS — R350 Frequency of micturition: Secondary | ICD-10-CM | POA: Insufficient documentation

## 2016-02-08 NOTE — Telephone Encounter (Signed)
Pt just seen but forgot to mention that she recently had an abnormal pap. Had an U/S done 01/29/16 where at uterine polyp was found and a biopsy done on 02/05/16. She should have those results by the end of the week.

## 2016-02-08 NOTE — Telephone Encounter (Signed)
Pap results printed and faxed.  Will forward to MD to place future orders for patient. Demani Weyrauch,CMA

## 2016-02-08 NOTE — Progress Notes (Signed)
   Subjective:    Patient ID: Kristen [name], female    DOB: Mar 25, 1941, 75 y.o.   MRN: [id number]  HPI  CC: results review  # Results review:  Went to [facility] screening  Abnormal: afib on EKG, SCr 1.00 and eGFR 54  Has no other specific complaints  # Urinary frequency  Going back to urology later this month  Has a history of frequent UTIs, so much that they saw scarring on scans/tests at urology  Not having much of any pain with urination or frequency right now. ROS: no fevers  # LE swelling  Chronic issue for her, has stockings that she purchased from [facility]  Both legs  Mostly notes after prolonged standing, when she wakes up in the morning they are not really swollen.   Denies any pain, rash, tenderness  After visit patient calls reporting that she had an abnormal pap smear at her OBGYN and had biopsy done, awaiting results.  Social Hx: never smoker  Review of Systems   See HPI for ROS.   Past medical history, surgical, family, and social history reviewed and updated in the EMR as appropriate. Objective:  BP 125/67 mmHg  Pulse 85  Temp(Src) 98.2 F (36.8 C) (Oral)  Wt 195 lb 9.6 oz (88.724 kg) Vitals and nursing note reviewed  General: no apparent distress  Extremities: there is 1+ edema bilaterally up to mid calf. Compression stockings on.  Assessment & Plan:  Chronic kidney disease Stage 3A which is not unexpected in a 75yo female. Lab was checked in February, will wait until next follow up visit and retest BMP.   Chronic venous insufficiency Discussed again today. She has mild insufficiency that seems to respond well with elevation. Recommended continuing stockings and elevation when sitting or laying down; can consider higher compression stockigns if her current ones are not giving her enough relief.  Urinary frequency Symptoms sound mild currently, has follow up with urology already scheduled. Deferred UA check today until that visit as she is not  having dysuria.

## 2016-02-08 NOTE — Assessment & Plan Note (Signed)
Symptoms sound mild currently, has follow up with urology already scheduled. Deferred UA check today until that visit as she is not having dysuria.

## 2016-02-08 NOTE — Telephone Encounter (Signed)
Pt called because she was seen to today by her doctor and it was suggested that she be screened for diabetes. She ordinally said no but after getting home has decided she would liked to have this done. She also was wanting a copy of her last pap smear faxed to [facility] [phone], She believed that she did this over 10 years ago. jw

## 2016-02-08 NOTE — Telephone Encounter (Signed)
Thank you I will put this in the chart.

## 2016-02-08 NOTE — Assessment & Plan Note (Signed)
Discussed again today. She has mild insufficiency that seems to respond well with elevation. Recommended continuing stockings and elevation when sitting or laying down; can consider higher compression stockigns if her current ones are not giving her enough relief.

## 2016-02-08 NOTE — Assessment & Plan Note (Signed)
Stage 3A which is not unexpected in a 76yo female. Lab was checked in February, will wait until next follow up visit and retest BMP.

## 2016-02-08 NOTE — Patient Instructions (Signed)
You are due for your next colonoscopy on 03/07/2016

## 2016-02-09 NOTE — Telephone Encounter (Signed)
LM for patient to call back and schedule a lab appt for her A1C. Doral Ventrella,CMA

## 2016-02-09 NOTE — Telephone Encounter (Signed)
Future order placed for hgb A1c.

## 2016-02-15 ENCOUNTER — Other Ambulatory Visit (INDEPENDENT_AMBULATORY_CARE_PROVIDER_SITE_OTHER): Payer: Medicare Other

## 2016-02-15 DIAGNOSIS — R739 Hyperglycemia, unspecified: Secondary | ICD-10-CM | POA: Diagnosis not present

## 2016-02-15 LAB — POCT GLYCOSYLATED HEMOGLOBIN (HGB A1C): Hemoglobin A1C: 5.3

## 2016-02-22 ENCOUNTER — Telehealth: Payer: Self-pay | Admitting: Family Medicine

## 2016-02-22 DIAGNOSIS — R300 Dysuria: Secondary | ICD-10-CM | POA: Diagnosis not present

## 2016-02-22 DIAGNOSIS — Z0000 Encounter for general adult medical examination without abnormal findings: Secondary | ICD-10-CM | POA: Diagnosis not present

## 2016-02-22 DIAGNOSIS — N3946 Mixed incontinence: Secondary | ICD-10-CM | POA: Diagnosis not present

## 2016-02-22 NOTE — Telephone Encounter (Signed)
Pt is calling because she said that her labs were supposed to be faxed to [facility]. Her appointment is in 5 minutes. She also said that she gave the doctor a paper from [facility]   to scan in her chart and she would also like that to be faxed to them jw

## 2016-02-22 NOTE — Telephone Encounter (Signed)
Faxed copy of 02/15/2016 lab results to [facility] per pt request. Please advise pt [facility] paper was never received by PCP.

## 2016-02-22 NOTE — Telephone Encounter (Signed)
Please send labs to [facility] today.  Patient have an appt at 2:00.  They need this while she's at her appt.

## 2016-02-23 ENCOUNTER — Encounter (HOSPITAL_COMMUNITY): Payer: Self-pay

## 2016-02-23 ENCOUNTER — Emergency Department (HOSPITAL_COMMUNITY)
Admission: EM | Admit: 2016-02-23 | Discharge: 2016-02-23 | Disposition: A | Discharge status: Home / Self Care | Payer: Medicare Other | Attending: Emergency Medicine | Admitting: Emergency Medicine

## 2016-02-23 ENCOUNTER — Emergency Department (HOSPITAL_COMMUNITY): Payer: Medicare Other

## 2016-02-23 DIAGNOSIS — Y9389 Activity, other specified: Secondary | ICD-10-CM | POA: Insufficient documentation

## 2016-02-23 DIAGNOSIS — F329 Major depressive disorder, single episode, unspecified: Secondary | ICD-10-CM | POA: Insufficient documentation

## 2016-02-23 DIAGNOSIS — S199XXA Unspecified injury of neck, initial encounter: Secondary | ICD-10-CM | POA: Diagnosis not present

## 2016-02-23 DIAGNOSIS — Z87440 Personal history of urinary (tract) infections: Secondary | ICD-10-CM | POA: Diagnosis not present

## 2016-02-23 DIAGNOSIS — R0789 Other chest pain: Secondary | ICD-10-CM

## 2016-02-23 DIAGNOSIS — F419 Anxiety disorder, unspecified: Secondary | ICD-10-CM | POA: Insufficient documentation

## 2016-02-23 DIAGNOSIS — M419 Scoliosis, unspecified: Secondary | ICD-10-CM | POA: Insufficient documentation

## 2016-02-23 DIAGNOSIS — I10 Essential (primary) hypertension: Secondary | ICD-10-CM | POA: Diagnosis not present

## 2016-02-23 DIAGNOSIS — S279XXA Injury of unspecified intrathoracic organ, initial encounter: Secondary | ICD-10-CM | POA: Diagnosis not present

## 2016-02-23 DIAGNOSIS — R079 Chest pain, unspecified: Secondary | ICD-10-CM | POA: Diagnosis not present

## 2016-02-23 DIAGNOSIS — S29001A Unspecified injury of muscle and tendon of front wall of thorax, initial encounter: Secondary | ICD-10-CM | POA: Diagnosis not present

## 2016-02-23 DIAGNOSIS — Y998 Other external cause status: Secondary | ICD-10-CM | POA: Diagnosis not present

## 2016-02-23 DIAGNOSIS — Y92410 Unspecified street and highway as the place of occurrence of the external cause: Secondary | ICD-10-CM | POA: Diagnosis not present

## 2016-02-23 DIAGNOSIS — Z79899 Other long term (current) drug therapy: Secondary | ICD-10-CM | POA: Diagnosis not present

## 2016-02-23 DIAGNOSIS — S299XXA Unspecified injury of thorax, initial encounter: Secondary | ICD-10-CM | POA: Diagnosis not present

## 2016-02-23 DIAGNOSIS — R52 Pain, unspecified: Secondary | ICD-10-CM

## 2016-02-23 DIAGNOSIS — S0990XA Unspecified injury of head, initial encounter: Secondary | ICD-10-CM | POA: Diagnosis not present

## 2016-02-23 LAB — I-STAT CHEM 8, ED
BUN: 19 mg/dL (ref 6–20)
Calcium, Ion: 1.1 mmol/L — ABNORMAL LOW (ref 1.13–1.30)
Chloride: 100 mmol/L — ABNORMAL LOW (ref 101–111)
Creatinine, Ser: 1 mg/dL (ref 0.44–1.00)
Glucose, Bld: 92 mg/dL (ref 65–99)
HCT: 44 % (ref 36.0–46.0)
Hemoglobin: 15 g/dL (ref 12.0–15.0)
Potassium: 3.7 mmol/L (ref 3.5–5.1)
Sodium: 141 mmol/L (ref 135–145)
TCO2: 32 mmol/L (ref 0–100)

## 2016-02-23 MED ORDER — IBUPROFEN 800 MG PO TABS: 800.0000 mg | ORAL_TABLET | Freq: Three times a day (TID) | ORAL | Status: DC | PRN

## 2016-02-23 MED ORDER — IOPAMIDOL (ISOVUE-370) INJECTION 76%
INTRAVENOUS | Status: AC
  Administered 2016-02-23: 100 mL
  Filled 2016-02-23: qty 100
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
## 2016-02-23 NOTE — Discharge Instructions (Signed)
Return here as needed.  Follow-up with your primary care doctor.  Your CT scan did not show any abnormalities

## 2016-02-23 NOTE — ED Notes (Signed)
Pt left with all her belongings and ambulated out of the treatment area.  
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
## 2016-02-29 DIAGNOSIS — R278 Other lack of coordination: Secondary | ICD-10-CM | POA: Diagnosis not present

## 2016-02-29 DIAGNOSIS — R3915 Urgency of urination: Secondary | ICD-10-CM | POA: Diagnosis not present

## 2016-02-29 DIAGNOSIS — M6281 Muscle weakness (generalized): Secondary | ICD-10-CM | POA: Diagnosis not present

## 2016-04-07 ENCOUNTER — Other Ambulatory Visit: Payer: Self-pay | Admitting: Family Medicine

## 2016-04-07 DIAGNOSIS — Z1231 Encounter for screening mammogram for malignant neoplasm of breast: Secondary | ICD-10-CM

## 2016-04-08 ENCOUNTER — Telehealth: Payer: Self-pay | Admitting: Family Medicine

## 2016-04-08 DIAGNOSIS — N644 Mastodynia: Secondary | ICD-10-CM
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
## 2016-04-08 NOTE — Telephone Encounter (Signed)
Will forward to Md to advise. Jazmin Hartsell,CMA  

## 2016-04-11 NOTE — Telephone Encounter (Signed)
Bilat diagnostic mammogram ordered. If this needs to be switched to unilateral that is fine. Can you call patient and have her schedule the exam? Thanks.

## 2016-04-11 NOTE — Telephone Encounter (Signed)
LM for patient on identified VM that imaging was ordered and that she can call and schedule this. Britini Garcilazo,CMA

## 2016-04-19 ENCOUNTER — Telehealth: Payer: Self-pay | Admitting: *Deleted

## 2016-04-19 DIAGNOSIS — N644 Mastodynia: Secondary | ICD-10-CM

## 2016-04-19 NOTE — Telephone Encounter (Signed)
Orders placed. If these are wrong can you please ask for the specific order (ie [id number]).

## 2016-04-19 NOTE — Telephone Encounter (Signed)
Olin Hauser called from the [facility] and stated that pt has order that needs to be changed.  Stated it needs to be Diagnostic Bilateral w/TOMO, also we need to include the location of the pain ex. 5:00,6:00. We also need to order an US of the breast as well when changing this order.  Once these things are changed they can then schedule pt for this. Forwarding to PCP.  Katharina Caper, April D, [place]

## 2016-04-28 ENCOUNTER — Ambulatory Visit
Admission: RE | Admit: 2016-04-28 | Discharge: 2016-04-28 | Disposition: A | Discharge status: Home / Self Care | Payer: Medicare Other | Source: Ambulatory Visit | Attending: Family Medicine | Admitting: Family Medicine

## 2016-04-28 DIAGNOSIS — N644 Mastodynia: Secondary | ICD-10-CM | POA: Diagnosis not present

## 2016-05-23 ENCOUNTER — Telehealth: Payer: Self-pay | Admitting: *Deleted

## 2016-05-23 NOTE — Telephone Encounter (Signed)
Pt wants a smaller pill called in for potassium. Please advise and call pt back. Molly Savarino Kennon Holter, CMA

## 2016-05-24 NOTE — Telephone Encounter (Signed)
Will forward to MD to advise. Junnie Loschiavo,CMA  

## 2016-05-25 NOTE — Telephone Encounter (Signed)
Left message for pt to call back and discuss. Page, cma.

## 2016-05-25 NOTE — Telephone Encounter (Signed)
Do not see potassium on patient's med list. Unsure why she is taking as last K is in normal range.. Previously seen by Dr. Lamar Benes and I am now new PCP. Just for patient information potassium unfortunately comes as a large pill and there is not a smaller version. Please have her come in if she wants to discuss further.

## 2016-05-25 NOTE — Telephone Encounter (Signed)
Made an apt for pt to meet new doctor and discuss Potassium. Page, cma.

## 2016-06-01 ENCOUNTER — Other Ambulatory Visit: Payer: Self-pay | Admitting: Gastroenterology

## 2016-06-01 DIAGNOSIS — K5730 Diverticulosis of large intestine without perforation or abscess without bleeding: Secondary | ICD-10-CM | POA: Diagnosis not present

## 2016-06-01 DIAGNOSIS — D126 Benign neoplasm of colon, unspecified: Secondary | ICD-10-CM | POA: Diagnosis not present

## 2016-06-01 DIAGNOSIS — D122 Benign neoplasm of ascending colon: Secondary | ICD-10-CM | POA: Diagnosis not present

## 2016-06-01 DIAGNOSIS — Z1211 Encounter for screening for malignant neoplasm of colon: Secondary | ICD-10-CM | POA: Diagnosis not present

## 2016-06-02 ENCOUNTER — Other Ambulatory Visit: Payer: Self-pay | Admitting: Obstetrics and Gynecology

## 2016-06-02 NOTE — Telephone Encounter (Signed)
Pt received a phone call from [facility] stating they could not reach [place] about her Rx refill so they are unable to fill it. She is needing a refill on Loratadine. Please call her after medication has been called in. Thanks! ep

## 2016-06-03 MED ORDER — LORATADINE 10 MG PO TABS: 10.0000 mg | ORAL_TABLET | Freq: Every day | ORAL | 11 refills | Status: DC

## 2016-06-03 NOTE — Telephone Encounter (Signed)
Refilled

## 2016-06-08 DIAGNOSIS — H2513 Age-related nuclear cataract, bilateral: Secondary | ICD-10-CM | POA: Diagnosis not present

## 2016-06-08 DIAGNOSIS — H1850 Unspecified hereditary corneal dystrophies: Secondary | ICD-10-CM | POA: Diagnosis not present

## 2016-06-08 DIAGNOSIS — H25813 Combined forms of age-related cataract, bilateral: Secondary | ICD-10-CM | POA: Diagnosis not present

## 2016-06-14 ENCOUNTER — Encounter: Payer: Self-pay | Admitting: Obstetrics and Gynecology

## 2016-06-14 ENCOUNTER — Ambulatory Visit (INDEPENDENT_AMBULATORY_CARE_PROVIDER_SITE_OTHER): Payer: Medicare Other | Admitting: Obstetrics and Gynecology

## 2016-06-14 VITALS — BP 116/46 | HR 63 | Temp 98.2°F | Wt 195.0 lb

## 2016-06-14 DIAGNOSIS — I482 Chronic atrial fibrillation, unspecified: Secondary | ICD-10-CM

## 2016-06-14 DIAGNOSIS — I10 Essential (primary) hypertension: Secondary | ICD-10-CM | POA: Diagnosis not present

## 2016-06-14 DIAGNOSIS — I5032 Chronic diastolic (congestive) heart failure: Secondary | ICD-10-CM | POA: Diagnosis not present

## 2016-06-14 DIAGNOSIS — Z7189 Other specified counseling: Secondary | ICD-10-CM

## 2016-06-14 DIAGNOSIS — N183 Chronic kidney disease, stage 3 unspecified: Secondary | ICD-10-CM

## 2016-06-14 DIAGNOSIS — I110 Hypertensive heart disease with heart failure: Secondary | ICD-10-CM

## 2016-06-14 DIAGNOSIS — Z7689 Persons encountering health services in other specified circumstances: Secondary | ICD-10-CM

## 2016-06-14 MED ORDER — POTASSIUM CHLORIDE POWD: 0 refills | Status: DC

## 2016-06-14 NOTE — Progress Notes (Signed)
     Subjective: Chief Complaint  Patient presents with  . Medication Management     HPI: Kristen [name] is a 75 y.o. presenting to clinic today to meet new doctor and go over medical history. She also wants to discuss the following:  #Medication Managment -wants to know if she can have a different formulation of potassium as the pills are to large for her to swallow -have not taken in the last couple of weeks -does not need refills on other medications  #CHF: -Controlled without any current complaints.  -Dyspnea with activity some but still tries to dance Taking Meds: Yes BB, ACEi, Diuretic Side effects: Tolerating well  Cardiologist last seen: has appointment today ROS: Denies symptoms of palpitations, PND, syncope, bleeding, increased wt Does have some feet/ankle edema and orthopnea which she states is chronic  #A.fib -can feel when she is in Afib -takes pradaxa -denies any bleeding, chest pain  #Hypertension Controlled on medications Taking Meds: Yes Side effects: None ROS: Denies headache, dizziness, visual changes, nausea, vomiting, chest pain, abdominal pain or shortness of breath.   ROS noted in HPI.  Past Medical, Surgical, Social, and Family History Reviewed & Updated per EMR. Smoking status - never smoker   Objective: BP (!) 116/46   Pulse 63   Temp 98.2 F (36.8 C) (Oral)   Wt 195 lb (88.5 kg)   BMI 35.67 kg/m  Vitals and nursing notes reviewed  Physical Exam  Constitutional: She is well-developed, well-nourished, and in no distress.  Neck: No JVD present.  Cardiovascular: An irregularly irregular rhythm present.  Pulmonary/Chest: Effort normal and breath sounds normal.  Musculoskeletal: She exhibits edema.    Assessment/Plan: Please see problem based Assessment and Plan    Meds ordered this encounter  Medications  . Potassium Chloride POWD    Sig: Take 20mEq daily.    Dispense:  500 g    Refill:  0    Please give 42meq packets.      Luiz Blare, DO 06/14/2016, 11:44 AM PGY-3, [place]

## 2016-06-14 NOTE — Patient Instructions (Signed)
Potassium powder sent to pharmacy Follow-up with heart failure doctor Wear compression stockings daily  Follow-up in 3 months

## 2016-06-15 NOTE — Assessment & Plan Note (Signed)
Follows with Dr. Wynonia Lawman. No signs of worsening heart failure at this time. Does have chronic orthopnea and leg swelling. Leg swelling thought to be due to venous insufficiency but could be a component of HF. On Lasix for diuresis. Patient to continue medication regimen. No adjustments at this time. Patient to have release of notes from Dr. Thurman Coyer office as I am unable to see them on epic.

## 2016-06-15 NOTE — Assessment & Plan Note (Signed)
Last known stage 3 kidney disease. IS due for repeat blood work to monitor renal function on her medications but patient declines at this time. Potassium switched from oral pill to oral powder so patient able to tolerate better. She will need retest of blood work next visit.

## 2016-06-15 NOTE — Assessment & Plan Note (Signed)
Chronic A.fib. On Pradaxa which she tolerates well. No signs of bleeding. Also uses metoprolol for rate control. Follows with cardiologist Dr. Wynonia Lawman.

## 2016-06-15 NOTE — Assessment & Plan Note (Signed)
Controlled. Continue current regimen that includes lisinopril-hctz, metoprolol,and lasix. May need to back down on something if BP becomes to soft in this elderly female. For now she is asymptomatic and has been on this regimen for years.

## 2016-06-23 ENCOUNTER — Other Ambulatory Visit: Payer: Self-pay | Admitting: Family Medicine

## 2016-06-23 NOTE — Telephone Encounter (Signed)
Ready for patient pick up at front.

## 2016-06-30 ENCOUNTER — Telehealth: Payer: Self-pay | Admitting: Obstetrics and Gynecology

## 2016-06-30 NOTE — Telephone Encounter (Signed)
Pt is calling and would like her valium faxed to her pharmacy. She lives 15 miles away which would be a total on 30 miles for her. jw

## 2016-07-01 MED ORDER — DIAZEPAM 5 MG PO TABS: ORAL_TABLET | ORAL | 0 refills | Status: DC

## 2016-07-01 NOTE — Telephone Encounter (Signed)
Medication phoned in to patient's pharmacy.

## 2016-07-24 ENCOUNTER — Other Ambulatory Visit: Payer: Self-pay | Admitting: Family Medicine

## 2016-08-17 ENCOUNTER — Other Ambulatory Visit: Payer: Self-pay | Admitting: Obstetrics and Gynecology

## 2016-08-18 MED ORDER — DIAZEPAM 5 MG PO TABS
ORAL_TABLET | ORAL | 0 refills | Status: DC
Start: 2016-08-18 — End: 2016-10-27

## 2016-08-18 NOTE — Telephone Encounter (Signed)
Medications sent to pharmacy. Patient can pick up Valium (printed).

## 2016-08-26 NOTE — Telephone Encounter (Signed)
Medication called into pharmacy per patient's request and provider's approval. Kristen [name]

## 2016-08-31 ENCOUNTER — Ambulatory Visit: Payer: Medicare Other | Admitting: Obstetrics and Gynecology

## 2016-08-31 ENCOUNTER — Other Ambulatory Visit: Payer: Self-pay | Admitting: *Deleted

## 2016-08-31 NOTE — Telephone Encounter (Signed)
Did the refill this way because I want to check her potassium before continuing to refill.

## 2016-08-31 NOTE — Telephone Encounter (Signed)
Refill request for 90 day supply.  Martin, Tamika L, RN  

## 2016-09-12 ENCOUNTER — Ambulatory Visit: Payer: Medicare Other | Admitting: Obstetrics and Gynecology

## 2016-10-19 ENCOUNTER — Other Ambulatory Visit: Payer: Self-pay | Admitting: Obstetrics and Gynecology

## 2016-10-19 NOTE — Telephone Encounter (Signed)
Patient requesting refill for Diazepam.

## 2016-10-19 NOTE — Telephone Encounter (Signed)
Will route request to PCP.  Burna Forts, BSN, RN-BC

## 2016-10-19 NOTE — Telephone Encounter (Signed)
Patient has been counseled several times that no additional refills would be given until she can come in for a visit. I gave her limited supply last time to get her to her appointment. She was made aware of this by Kennyth Lose and Jazmin. Please advise her she needs to be seen. I have appointment slots available.

## 2016-10-20 NOTE — Telephone Encounter (Signed)
Attempted to call pt to schedule an apt- no answer and unable to leave VM.

## 2016-10-21 NOTE — Telephone Encounter (Signed)
Called patient on both numbers listed and no VM available.  Will mail letter asking her to call us to make an appointment before anymore refills can be given. Jazmin Hartsell,CMA

## 2016-10-27 ENCOUNTER — Encounter: Payer: Self-pay | Admitting: Obstetrics and Gynecology

## 2016-10-27 ENCOUNTER — Ambulatory Visit (INDEPENDENT_AMBULATORY_CARE_PROVIDER_SITE_OTHER): Payer: Medicare Other | Admitting: Obstetrics and Gynecology

## 2016-10-27 VITALS — BP 128/62 | HR 64 | Temp 97.7°F | Wt 185.4 lb

## 2016-10-27 DIAGNOSIS — I482 Chronic atrial fibrillation, unspecified: Secondary | ICD-10-CM

## 2016-10-27 DIAGNOSIS — I110 Hypertensive heart disease with heart failure: Secondary | ICD-10-CM | POA: Diagnosis not present

## 2016-10-27 DIAGNOSIS — I5032 Chronic diastolic (congestive) heart failure: Secondary | ICD-10-CM

## 2016-10-27 DIAGNOSIS — F411 Generalized anxiety disorder: Secondary | ICD-10-CM

## 2016-10-27 MED ORDER — DIAZEPAM 5 MG PO TABS: ORAL_TABLET | ORAL | 0 refills | Status: DC

## 2016-10-27 MED ORDER — POTASSIUM CHLORIDE 20 MEQ PO PACK: PACK | ORAL | 2 refills | Status: DC

## 2016-10-27 MED ORDER — LORATADINE 10 MG PO TABS: 10.0000 mg | ORAL_TABLET | Freq: Every day | ORAL | 11 refills | Status: DC

## 2016-10-27 NOTE — Patient Instructions (Addendum)
Medications refilled and sent to pharmacy   Continue all therapies  Blood work collected. Will contact you about results.   Happy Holidays!

## 2016-10-27 NOTE — Progress Notes (Signed)
     Subjective: Chief Complaint  Patient presents with  . Medication Refill     HPI: Kristen [name] is a 75 y.o. presenting to clinic today to discuss the following:  #Anxiety Long standing history  Takes Valium which helps with this and sleep Worries a lot and thinks bad things will happen Been on medication for years (~20)  #A. Fib stable On pradexa and lopressor no issues Denies bleeding, chest pain, dyspnea  #HTN Well-controlled Denies side effects Compliant with medication  #CHF: -Controlled without any current complaints.  Taking Meds: Yes  Cardiologist Dr. Wynonia Lawman Side effects: Tolerating well  ROS: Denies symptoms of palpitations, orthopnea, PND, edema, syncope, bleeding, increased wt    Health Maintenance Due  Topic Date Due  . ZOSTAVAX  05/03/2000  . INFLUENZA VACCINE  06/07/2016  . PNA vac Low Risk Adult (2 of 2 - PPSV23) 07/09/2016     ROS noted in HPI.  Past Medical, Surgical, Social, and Family History Reviewed & Updated per EMR. History  Smoking Status  . Never Smoker  Smokeless Tobacco  . Never Used    Objective: BP 128/62   Pulse 64   Temp 97.7 F (36.5 C) (Oral)   Wt 185 lb 6.4 oz (84.1 kg)   SpO2 96%   BMI 33.91 kg/m  Vitals and nursing notes reviewed  Physical Exam  Constitutional: She is well-developed, well-nourished, and in no distress.  Cardiovascular: Normal rate.  An irregularly irregular rhythm present.  Pulmonary/Chest: Effort normal and breath sounds normal.  Musculoskeletal: She exhibits edema.    Assessment/Plan: Please see problem based Assessment and Plan  Health Maintainance: Declined flu vaccine   Orders Placed This Encounter  Procedures  . BASIC METABOLIC PANEL WITH GFR    Meds ordered this encounter  Medications  . diazepam (VALIUM) 5 MG tablet    Sig: TAKE 1 TABLET BY MOUTH AT BEDTIME AS NEEDED FOR ANXIETY    Dispense:  60 tablet    Refill:  0  . loratadine (CLARITIN) 10 MG tablet   Sig: Take 1 tablet (10 mg total) by mouth daily.    Dispense:  30 tablet    Refill:  11  . potassium chloride (KLOR-CON) 20 MEQ packet    Sig: TAKE 20MEQ DAILY.    Dispense:  30 packet    Refill:  [name], DO 10/27/2016, 11:53 AM PGY-3, [place]

## 2016-10-28 ENCOUNTER — Telehealth: Payer: Self-pay | Admitting: Obstetrics and Gynecology

## 2016-10-28 LAB — BASIC METABOLIC PANEL WITH GFR
BUN: 24 mg/dL (ref 7–25)
CHLORIDE: 99 mmol/L (ref 98–110)
CO2: 26 mmol/L (ref 20–31)
Calcium: 9.3 mg/dL (ref 8.6–10.4)
Creat: 1.19 mg/dL — ABNORMAL HIGH (ref 0.60–0.93)
GFR, EST AFRICAN AMERICAN: 51 mL/min — AB (ref >=60)
GFR, EST NON AFRICAN AMERICAN: 44 mL/min — AB (ref >=60)
GLUCOSE: 88 mg/dL (ref 65–99)
POTASSIUM: 3.6 mmol/L (ref 3.5–5.3)
Sodium: 140 mmol/L (ref 135–146)

## 2016-10-28 NOTE — Telephone Encounter (Signed)
Will forward to MD to make her aware.  Jazmin Hartsell,CMA  

## 2016-10-28 NOTE — Telephone Encounter (Signed)
Noted. Thanks.

## 2016-10-28 NOTE — Telephone Encounter (Signed)
Pt called and wanted the doctor to know that she was using Splenda and not the one she said yesterday. jw

## 2016-11-01 NOTE — Assessment & Plan Note (Signed)
Stable long standing condition. Well controlled on Valium. Refill given. No signs of abuse. Will give 4 month supply for patient; next fill not until April.

## 2016-11-01 NOTE — Assessment & Plan Note (Signed)
Chronic A.fib. Rate controlled. Continue therapy.

## 2016-11-01 NOTE — Assessment & Plan Note (Signed)
Stable. Continue current therapy. Follows with cardiology. Has not had a recent echo since 2011.

## 2016-11-01 NOTE — Assessment & Plan Note (Addendum)
Controlled. At goal BP. Continue current regimen. Will collect blood work today.

## 2016-11-02 ENCOUNTER — Encounter: Payer: Self-pay | Admitting: Obstetrics and Gynecology

## 2016-11-03 ENCOUNTER — Telehealth: Payer: Self-pay | Admitting: Obstetrics and Gynecology

## 2016-11-03 NOTE — Telephone Encounter (Signed)
It is unfortunate that patient does not want to be seen by provider. Diarrhea cannot be fully assessed over phone. Hopefully she just has a stomach bug that will get better but if she has had diarrhea for more than 3 days needs to be examined and have blood work collected. All we can do is advise patient. Thank you all for your assistance!

## 2016-11-03 NOTE — Telephone Encounter (Signed)
Pt has had diarrhea for days, constantly coming out. Pt would like to know what she needed to do or if it just has to run its course.Asked if pt would like to make appointment and she declined. ep

## 2016-11-03 NOTE — Telephone Encounter (Signed)
LM for patient to call back.  Please inform her that a letter was mailed out today with her results on it and it was all baseline normal.  Thanks Jazmin Hartsell,CMA

## 2016-11-03 NOTE — Telephone Encounter (Signed)
Return to patient regarding diarrhea.  Patient has been having diarrhea since Tuesday with episode one of vomiting. Patient denies fever. Patient reported that she is seating on towels and chuck pads because the diarrhea is non-stop.  Advised patient if she is having diarrhea that much she should be seen by a provider. Patient declined appointment.  Advise patient to make sure she keep herself well hydrated.  She can try and take IMODIUM for symptom relief for a day or two.  If medication does not work she should be seen by a provider. Patient can try doing a clear liquid diet for now. Will forward to PCP for further advise.  Derl Barrow, RN

## 2016-11-03 NOTE — Telephone Encounter (Signed)
Pt called back and was informed of normal results. Pt stated she has been having diarrhea for a few days, but states she has been staying plenty hydrated. Pt was offered an apt, but declined.

## 2016-11-03 NOTE — Telephone Encounter (Signed)
Pt would like someone to call her with lab results. Thanks! ep

## 2016-11-04 NOTE — Telephone Encounter (Signed)
Left message on voicemail for patient to call back. 

## 2016-11-07 HISTORY — PX: CATARACT EXTRACTION W/ INTRAOCULAR LENS  IMPLANT, BILATERAL: SHX1307

## 2016-11-10 ENCOUNTER — Telehealth: Payer: Self-pay | Admitting: Obstetrics and Gynecology

## 2016-11-10 NOTE — Telephone Encounter (Signed)
Clinical info completed on handicap placard form.  Place form in Dr Phelp's box for completion.  Andreas Newport, [facility]

## 2016-11-10 NOTE — Telephone Encounter (Signed)
Pt called and would like the doctor to fill out the paperwork so that she can get a handicap sticker for her car. She has heart faliure and this would be a good benefit for her. jw

## 2016-11-15 NOTE — Telephone Encounter (Signed)
Patient voiced understanding and will go back to ortho for them to look at her knee again since she put off surgery before.  Jazmin Hartsell,CMA

## 2016-11-15 NOTE — Telephone Encounter (Signed)
At this time I do not think patient meets any of the criteria on the handicap placard. Her HF is not severe enough to meet the qualifications (need to be class 3 or 4). Patient can review requirements that are listed on the form. Also since she has never been worked up for this and never had a handicap sticker she needs to come in for evaluation.

## 2016-12-15 DIAGNOSIS — I10 Essential (primary) hypertension: Secondary | ICD-10-CM | POA: Diagnosis not present

## 2016-12-15 DIAGNOSIS — I5032 Chronic diastolic (congestive) heart failure: Secondary | ICD-10-CM | POA: Diagnosis not present

## 2016-12-15 DIAGNOSIS — I482 Chronic atrial fibrillation: Secondary | ICD-10-CM | POA: Diagnosis not present

## 2017-01-24 ENCOUNTER — Other Ambulatory Visit: Payer: Self-pay | Admitting: Obstetrics and Gynecology

## 2017-02-06 DIAGNOSIS — N840 Polyp of corpus uteri: Secondary | ICD-10-CM | POA: Diagnosis not present

## 2017-02-06 DIAGNOSIS — Z01419 Encounter for gynecological examination (general) (routine) without abnormal findings: Secondary | ICD-10-CM | POA: Diagnosis not present

## 2017-02-06 DIAGNOSIS — Z1389 Encounter for screening for other disorder: Secondary | ICD-10-CM | POA: Diagnosis not present

## 2017-02-06 DIAGNOSIS — Z130 Encounter for screening for diseases of the blood and blood-forming organs and certain disorders involving the immune mechanism: Secondary | ICD-10-CM | POA: Diagnosis not present

## 2017-02-06 DIAGNOSIS — Z124 Encounter for screening for malignant neoplasm of cervix: Secondary | ICD-10-CM | POA: Diagnosis not present

## 2017-02-15 DIAGNOSIS — N840 Polyp of corpus uteri: Secondary | ICD-10-CM | POA: Diagnosis not present

## 2017-02-24 ENCOUNTER — Other Ambulatory Visit: Payer: Self-pay | Admitting: Obstetrics and Gynecology

## 2017-02-24 NOTE — Telephone Encounter (Signed)
Needs [facility]ill on [facility]am . [facility] [address]

## 2017-02-28 MED ORDER — DIAZEPAM 5 MG PO TABS: ORAL_TABLET | ORAL | 0 refills | Status: DC

## 2017-02-28 NOTE — Telephone Encounter (Signed)
Medication called into pharmacy and patient is aware. Jazmin Hartsell,CMA

## 2017-03-03 ENCOUNTER — Telehealth: Payer: Self-pay | Admitting: Obstetrics and Gynecology

## 2017-03-03 DIAGNOSIS — F489 Nonpsychotic mental disorder, unspecified: Secondary | ICD-10-CM
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
## 2017-03-07 NOTE — Telephone Encounter (Signed)
Referral placed. I would like to see patient in clinic to discuss further and glad she was able to open up to someone.

## 2017-03-07 NOTE — Telephone Encounter (Signed)
Contacted pt to inform her of referral placed. Pt stated she never asked for this and became a bit upset, "I dont have mental health issues." Pt began to say that she spoke to a woman at her insurance and told her a story (see note from harriet.) Pt stated this did not mean she had mental health issues. Pt stated she has normal ups and downs just like everyone else. I offered patient an apt with pcp to discuss any concerns, pt declined. Pt advised the referral has already been placed and she may hear from someone and to schedule an apt as she feels. Will forward to pcp for fyi.

## 2017-04-06 ENCOUNTER — Encounter (HOSPITAL_COMMUNITY): Payer: Self-pay | Admitting: *Deleted

## 2017-04-06 ENCOUNTER — Ambulatory Visit (HOSPITAL_COMMUNITY)
Admission: EM | Admit: 2017-04-06 | Discharge: 2017-04-06 | Disposition: A | Discharge status: Home / Self Care | Payer: Medicare Other | Attending: Internal Medicine | Admitting: Internal Medicine

## 2017-04-06 DIAGNOSIS — M7662 Achilles tendinitis, left leg: Secondary | ICD-10-CM | POA: Diagnosis not present

## 2017-04-06 MED ORDER — METHYLPREDNISOLONE 4 MG PO TBPK: ORAL_TABLET | ORAL | 0 refills | Status: DC

## 2017-04-06 NOTE — ED Triage Notes (Signed)
Patient reports about a month ago she started having left heel pain states that yesterday she developed pain in left ankle. Patient with swelling noted around ankle.

## 2017-04-06 NOTE — ED Provider Notes (Signed)
CSN: [id number]     Arrival date & time 04/06/17  1916 History   None    Chief Complaint  Patient presents with  . Joint Swelling   (Consider location/radiation/quality/duration/timing/severity/associated sxs/prior Treatment) Patient c/o left heel pain and left foot swelling.   The history is provided by the patient.  Foot Injury  Location:  Foot Foot location:  L foot Pain details:    Quality:  Aching   Severity:  No pain   Duration:  2 days   Timing:  Constant   Progression:  Worsening Chronicity:  New Foreign body present:  No foreign bodies Prior injury to area:  No Relieved by:  Nothing Worsened by:  Nothing   Past Medical History:  Diagnosis Date  . Anxiety   . Atrial fibrillation ([place])   . Cardiac dysrhythmia, unspecified   . Depression   . Diverticula of colon   . Essential hypertension, benign   . Fatigue   . Fibrocystic breast disease 1980s   General Surgery aspirates  . History of diverticulitis 01/22/2016  . Irregular heart beat 2012  . Lumbar back pain   . Need for prophylactic vaccination and inoculation against influenza   . Osteopenia   . Scoliosis   . Urinary tract infection    Past Surgical History:  Procedure Laterality Date  . BREAST SURGERY  1980   cyst removed  . BUNIONECTOMY  2003   Dr Sharol Given  . Eyelid surgery  2006  . KNEE CARTILAGE SURGERY  2002   Dr Para March  . TUBAL LIGATION  1980   Family History  Problem Relation Age of Onset  . Cancer Mother        ovarian  . Kidney disease Father   . Alcohol abuse Father   . Cancer Maternal Aunt        lung- smoker  . Heart disease Maternal Uncle    Social History  Substance Use Topics  . Smoking status: Never Smoker  . Smokeless tobacco: Never Used  . Alcohol use Yes     Comment: once a month   OB History    Gravida Para Term Preterm AB Living   2             SAB TAB Ectopic Multiple Live Births                 Review of Systems  Constitutional: Negative.   HENT: Negative.    Eyes: Negative.   Respiratory: Negative.   Cardiovascular: Negative.   Gastrointestinal: Negative.   Endocrine: Negative.   Genitourinary: Negative.   Musculoskeletal: Positive for arthralgias.  Allergic/Immunologic: Negative.   Neurological: Negative.     Allergies  Flecainide and Flecainide acetate  Home Medications   Prior to Admission medications   Medication Sig Start Date End Date Taking? Authorizing Provider  b complex vitamins tablet Take 1 tablet by mouth daily.     Yes [provider]  calcium citrate-vitamin D (CITRACAL+D) 315-200 MG-UNIT per tablet Take 1 tablet by mouth daily.     Yes [provider]  Dabigatran Etexilate Mesylate (PRADAXA) 150 MG CAPS Take 150 mg by mouth every 12 (twelve) hours.     Yes [provider]  furosemide (LASIX) 20 MG tablet Take 1 tablet (20 mg total) by mouth daily. 02/20/15  Yes [name], MD  lisinopril-hydrochlorothiazide (PRINZIDE,ZESTORETIC) 20-25 MG tablet TAKE 1 TABLET BY MOUTH DAILY. 07/25/16  Yes Luiz Blare Y, DO  loratadine (CLARITIN) 10 MG tablet Take  1 tablet (10 mg total) by mouth daily. 10/27/16  Yes Luiz Blare Y, DO  metoprolol (LOPRESSOR) 50 MG tablet Take 1 tablet (50 mg) in the AM and 1 tablet PM. Patient taking differently: Take 50 mg by mouth 2 (two) times daily. Take 1 tablet (50 mg) in the AM and 1 tablet PM. 03/25/13  Yes Oh Park, Samuel Germany, MD  Nutritional Supplements (NUTRITIONAL SUPPLEMENT PO) Take by mouth. Acai berry/green tea, fish oil    Yes [provider]  potassium chloride (KLOR-CON) 20 MEQ packet TAKE 1 PACKET DAILY 01/25/17  Yes Luiz Blare Y, DO  alendronate (FOSAMAX) 10 MG tablet TAKE 1 TABLET WEEKLY WITH A FULL GLASS OF WATER ON AN EMPTY STOMACH 02/18/15   [name], MD  Ascorbic Acid (VITAMIN C) 100 MG tablet Take 100 mg by mouth daily.      [provider]  [facility] ANTI-FUNGAL 2 % powder APPLY TO AFFECTED AREA TWICE A DAY AFTER SKIN RESOLVES FROM  KETOCONAZOLE CREAM Patient taking differently: APPLY TO AFFECTED AREA TWICE A DAY AFTER SKIN RESOLVES FROM KETOCONAZOLE CREAM as needed 10/17/14   [name], MD  diazepam (VALIUM) 5 MG tablet TAKE 1 TABLET BY MOUTH AT BEDTIME AS NEEDED FOR ANXIETY. Needs PCP follow-up for further refills. 02/28/17   Katheren Shams, DO  doxycycline (VIBRA-TABS) 100 MG tablet TAKE 2 TABLETS (200 MG TOTAL) BY MOUTH ONCE AS NEEDED. IF YOU GET TICK BITE. 03/09/15   [name], MD  fish oil-omega-3 fatty acids 1000 MG capsule Take 2 g by mouth daily.    [provider]  glucosamine-chondroitin (GLUCOSAMINE-CHONDROITIN DS) 500-400 MG tablet Take 1 tablet by mouth.    [provider]  ibuprofen (ADVIL,MOTRIN) 800 MG tablet Take 1 tablet (800 mg total) by mouth every 8 (eight) hours as needed. Patient not taking: Reported on 06/14/2016 02/23/16   Dalia Heading, PA-C  methylPREDNISolone (MEDROL DOSEPAK) 4 MG TBPK tablet Take 6-5-4-3-2-1 po qd 04/06/17   Lysbeth Penner, FNP   Meds Ordered and Administered this Visit  Medications - No data to display  BP 127/78 (BP Location: Left Arm)   Pulse 76   Temp 98 F (36.7 C) (Oral)   Resp 17   SpO2 97%  No data found.   Physical Exam  Constitutional: She is oriented to person, place, and time. She appears well-developed and well-nourished.  HENT:  Head: Normocephalic and atraumatic.  Eyes: Conjunctivae and EOM are normal. Pupils are equal, round, and reactive to light.  Neck: Normal range of motion. Neck supple.  Cardiovascular: Normal rate, regular rhythm and normal heart sounds.   Pulmonary/Chest: Effort normal and breath sounds normal.  Abdominal: Soft. Bowel sounds are normal.  Musculoskeletal: She exhibits tenderness.  Tenderness with palpation left achilles tendon.  Left ankle swelling.  Neurological: She is alert and oriented to person, place, and time.  Nursing note and vitals reviewed.   Urgent Care Course     Procedures  (including critical care time)  Labs Review Labs Reviewed - No data to display  Imaging Review No results found.   Visual Acuity Review  Right Eye Distance:   Left Eye Distance:   Bilateral Distance:    Right Eye Near:   Left Eye Near:    Bilateral Near:         MDM   1. Tendonitis, Achilles, left    Medrol dose pack  Cam walker      Lysbeth Penner, FNP 04/06/17  2025  

## 2017-04-10 DIAGNOSIS — M7662 Achilles tendinitis, left leg: Secondary | ICD-10-CM | POA: Diagnosis not present

## 2017-04-10 DIAGNOSIS — M7661 Achilles tendinitis, right leg: Secondary | ICD-10-CM | POA: Diagnosis not present

## 2017-04-10 DIAGNOSIS — M1712 Unilateral primary osteoarthritis, left knee: Secondary | ICD-10-CM | POA: Diagnosis not present

## 2017-04-11 DIAGNOSIS — H2512 Age-related nuclear cataract, left eye: Secondary | ICD-10-CM | POA: Diagnosis not present

## 2017-04-11 DIAGNOSIS — H02839 Dermatochalasis of unspecified eye, unspecified eyelid: Secondary | ICD-10-CM | POA: Diagnosis not present

## 2017-04-11 DIAGNOSIS — H25013 Cortical age-related cataract, bilateral: Secondary | ICD-10-CM | POA: Diagnosis not present

## 2017-04-11 DIAGNOSIS — H25043 Posterior subcapsular polar age-related cataract, bilateral: Secondary | ICD-10-CM | POA: Diagnosis not present

## 2017-04-11 DIAGNOSIS — H2513 Age-related nuclear cataract, bilateral: Secondary | ICD-10-CM | POA: Diagnosis not present

## 2017-05-01 ENCOUNTER — Other Ambulatory Visit: Payer: Self-pay | Admitting: *Deleted

## 2017-05-01 ENCOUNTER — Telehealth: Payer: Self-pay | Admitting: Obstetrics and Gynecology

## 2017-05-01 MED ORDER — ALENDRONATE SODIUM 10 MG PO TABS: ORAL_TABLET | ORAL | 0 refills | Status: DC

## 2017-05-01 NOTE — Telephone Encounter (Signed)
Pt needs a refill on doxycyline for tick bites. Pt has been getting this medication regularly. Pt gets bite often by ticks. [facility] [place]

## 2017-05-02 ENCOUNTER — Other Ambulatory Visit: Payer: Self-pay | Admitting: Obstetrics and Gynecology

## 2017-05-02 DIAGNOSIS — Z1231 Encounter for screening mammogram for malignant neoplasm of breast: Secondary | ICD-10-CM | POA: Diagnosis not present

## 2017-05-02 MED ORDER — DOXYCYCLINE HYCLATE 100 MG PO TABS: ORAL_TABLET | ORAL | 0 refills | Status: DC

## 2017-05-02 NOTE — Telephone Encounter (Signed)
Limited amount given as this is not an indication for medicine. Patient has to meet specific criteria in order to get prophylaxis. She can come in to discuss with new PCP.

## 2017-05-03 ENCOUNTER — Other Ambulatory Visit: Payer: Self-pay | Admitting: Obstetrics and Gynecology

## 2017-05-05 NOTE — Telephone Encounter (Signed)
Pt states doxycyline that was called is over $100, pt would like the doxycyline that is only a couple dollars to be called in to [facility]

## 2017-05-08 NOTE — Telephone Encounter (Signed)
Spoke to pharmacy and tabs are the cheapest option for patient. It cost $3.35 per pill. Like I have told patient before she doesn't need this medication for tick bite prophylaxis. If she wants a discount she can go to goodrx.com or come by clinic and get a discount coupon to see if this will help reduce cost. Thanks

## 2017-05-09 ENCOUNTER — Other Ambulatory Visit: Payer: Self-pay | Admitting: Obstetrics and Gynecology

## 2017-05-09 DIAGNOSIS — R928 Other abnormal and inconclusive findings on diagnostic imaging of breast: Secondary | ICD-10-CM

## 2017-05-19 ENCOUNTER — Ambulatory Visit
Admission: RE | Admit: 2017-05-19 | Discharge: 2017-05-19 | Disposition: A | Discharge status: Home / Self Care | Payer: Medicare Other | Source: Ambulatory Visit | Attending: Obstetrics and Gynecology | Admitting: Obstetrics and Gynecology

## 2017-05-19 DIAGNOSIS — R928 Other abnormal and inconclusive findings on diagnostic imaging of breast: Secondary | ICD-10-CM | POA: Diagnosis not present

## 2017-05-19 DIAGNOSIS — N6489 Other specified disorders of breast: Secondary | ICD-10-CM | POA: Diagnosis not present

## 2017-05-22 DIAGNOSIS — M1712 Unilateral primary osteoarthritis, left knee: Secondary | ICD-10-CM | POA: Diagnosis not present

## 2017-05-29 DIAGNOSIS — Z961 Presence of intraocular lens: Secondary | ICD-10-CM | POA: Diagnosis not present

## 2017-05-29 DIAGNOSIS — H2513 Age-related nuclear cataract, bilateral: Secondary | ICD-10-CM | POA: Diagnosis not present

## 2017-05-29 DIAGNOSIS — H2512 Age-related nuclear cataract, left eye: Secondary | ICD-10-CM | POA: Diagnosis not present

## 2017-05-30 DIAGNOSIS — H2511 Age-related nuclear cataract, right eye: Secondary | ICD-10-CM | POA: Diagnosis not present

## 2017-06-16 DIAGNOSIS — I10 Essential (primary) hypertension: Secondary | ICD-10-CM | POA: Diagnosis not present

## 2017-06-16 DIAGNOSIS — I5032 Chronic diastolic (congestive) heart failure: Secondary | ICD-10-CM | POA: Diagnosis not present

## 2017-06-16 DIAGNOSIS — I482 Chronic atrial fibrillation: Secondary | ICD-10-CM | POA: Diagnosis not present

## 2017-06-19 DIAGNOSIS — H5203 Hypermetropia, bilateral: Secondary | ICD-10-CM | POA: Diagnosis not present

## 2017-06-19 DIAGNOSIS — H52223 Regular astigmatism, bilateral: Secondary | ICD-10-CM | POA: Diagnosis not present

## 2017-06-19 DIAGNOSIS — Z961 Presence of intraocular lens: Secondary | ICD-10-CM | POA: Diagnosis not present

## 2017-06-19 DIAGNOSIS — H2511 Age-related nuclear cataract, right eye: Secondary | ICD-10-CM | POA: Diagnosis not present

## 2017-06-30 ENCOUNTER — Other Ambulatory Visit: Payer: Self-pay | Admitting: *Deleted

## 2017-06-30 NOTE — Telephone Encounter (Signed)
Received message on nurse line from patient requesting refill of diazepam be called to pharmacy. Does not want to come to office to pick up Rx. States she is completely out. Hubbard Hartshorn, RN, BSN

## 2017-07-03 NOTE — Telephone Encounter (Signed)
Called in.   Please call patient to let her know she can pick this up.  She needs to come see me after this refill.  Thanks, [name]. Rosalyn Gess, [facility] Resident PGY-2 07/03/2017 3:20 PM

## 2017-07-03 NOTE — Telephone Encounter (Signed)
2nd request.  Martin, Tamika L, RN  

## 2017-07-04 NOTE — Telephone Encounter (Signed)
Pt informed. Kristen [name], CMA  

## 2017-07-12 ENCOUNTER — Other Ambulatory Visit: Payer: Self-pay | Admitting: Obstetrics and Gynecology

## 2017-07-20 DIAGNOSIS — M1712 Unilateral primary osteoarthritis, left knee: Secondary | ICD-10-CM | POA: Diagnosis not present

## 2017-07-20 DIAGNOSIS — M722 Plantar fascial fibromatosis: Secondary | ICD-10-CM | POA: Diagnosis not present

## 2017-07-25 DIAGNOSIS — Z961 Presence of intraocular lens: Secondary | ICD-10-CM | POA: Diagnosis not present

## 2017-07-27 ENCOUNTER — Other Ambulatory Visit: Payer: Self-pay | Admitting: Obstetrics and Gynecology

## 2017-08-11 DIAGNOSIS — R87612 Low grade squamous intraepithelial lesion on cytologic smear of cervix (LGSIL): Secondary | ICD-10-CM | POA: Diagnosis not present

## 2017-08-11 DIAGNOSIS — R87610 Atypical squamous cells of undetermined significance on cytologic smear of cervix (ASC-US): Secondary | ICD-10-CM | POA: Diagnosis not present

## 2017-08-15 ENCOUNTER — Ambulatory Visit: Payer: Medicare Other | Admitting: *Deleted

## 2017-09-03 ENCOUNTER — Other Ambulatory Visit: Payer: Self-pay | Admitting: Obstetrics and Gynecology

## 2017-09-22 DIAGNOSIS — M79604 Pain in right leg: Secondary | ICD-10-CM | POA: Diagnosis not present

## 2017-09-22 DIAGNOSIS — M79605 Pain in left leg: Secondary | ICD-10-CM | POA: Diagnosis not present

## 2017-10-13 ENCOUNTER — Other Ambulatory Visit: Payer: Self-pay | Admitting: Obstetrics and Gynecology

## 2017-10-17 ENCOUNTER — Other Ambulatory Visit: Payer: Self-pay | Admitting: *Deleted

## 2017-10-19 NOTE — Telephone Encounter (Signed)
Patient needs to be seen for refill.   I have never met them before and in the sig Dr. Gerarda Fraction had previously recommended PCP follow up for further refills.

## 2017-10-19 NOTE — Telephone Encounter (Signed)
I can dispense 7 pills to hold her off until her appointment since she is taking this PRN QHS.  I am not available to come to clinic to get this done today. Can do this tomorrow (12/14).

## 2017-10-19 NOTE — Telephone Encounter (Signed)
Informed pt of below and she has an appointment scheduled for 10/27/17 at 1:05pm.  Pt wanted to see about getting her refill before then if at all possible so she can get some rest, she said she would greatly appreciate if able.  I told pt would send message to PCP and see if he would and if not then we would see her at her appointment.  Katharina Caper, April D, [place]

## 2017-10-19 NOTE — Telephone Encounter (Signed)
Informed pt of below and she was very thankful, I told her we would contact her once completed tomorrow. Routing to PCP as FYI.  Katharina Caper, April D, [place]

## 2017-10-27 ENCOUNTER — Ambulatory Visit (INDEPENDENT_AMBULATORY_CARE_PROVIDER_SITE_OTHER): Payer: Medicare Other | Admitting: Family Medicine

## 2017-10-27 ENCOUNTER — Encounter: Payer: Self-pay | Admitting: Family Medicine

## 2017-10-27 ENCOUNTER — Other Ambulatory Visit: Payer: Self-pay

## 2017-10-27 VITALS — BP 118/82 | HR 77 | Temp 98.1°F | Ht 62.5 in | Wt 189.6 lb

## 2017-10-27 DIAGNOSIS — F4323 Adjustment disorder with mixed anxiety and depressed mood: Secondary | ICD-10-CM | POA: Diagnosis not present

## 2017-10-27 MED ORDER — DIAZEPAM 5 MG PO TABS: ORAL_TABLET | ORAL | 0 refills | Status: DC

## 2017-10-27 NOTE — Patient Instructions (Signed)
Kristen [name] you were seen today so that we could meet and I could refill your diazepam. We discussed your situational depression and anxiety and I filled 2 months worth. Please take one half pill at night as needed. Please follow up with me in 2 months.   Very nice to meet you! [name] L. Rosalyn Gess, [place] Resident PGY-2 10/27/2017 1:57 PM

## 2017-10-27 NOTE — Assessment & Plan Note (Signed)
Patient reports history of "situational depression".  Had a long discussion about history of depression and anxiety as noted in the HPI.  Patient was appropriate and does have a GAD7 to 14 and PHQ 9 of 9.  Discussed the option of medication and she feels that she has tried and off over the years and would prefer to try to improve her life on her own.  I think it is reasonable for her to continue her diazepam as needed nightly.  I recommended that she follow-up with me in 2 months and we can discuss things and make this regular thing, as she does live alone and feels that she is "alone in the world".  She does deny any thoughts of suicide or feelings of self-harm.

## 2017-10-27 NOTE — Progress Notes (Signed)
Subjective:  Kristen [name] is a 76 y.o. female who presents to the [facility] today for check up  HPI:  Anxiety and depression: Significant history of depression. Reports it as situational depression. Has seen Psychiatrist in the past.  Sons have disowned her and she is unable to see her children. Has three mortgages and is unable to keep them afloat. Holidays are very tough for her. Feels that she is "totally alone in the world".  Does have a friend in [facility] who she sees frequently and sees another friend who is out in [place] occasionally. Enjoys dancing and painting and knows that she needs to focus on herself and do things that she enjoys. She is stressed over all of the mortgages and only has a small stipend from [employer]. Denies thoughts of self-harm or thoughts of hurting herself. Has tried many medications over the years. Does not want to go out and see anyone. "I don't fit in anywhere, I have always been the square peg".  "I enjoy going on hate web sites and closing them down".    GAD 7 : Generalized Anxiety Score 10/27/2017  Nervous, Anxious, on Edge 3  Control/stop worrying 3  Worry too much - different things 3  Trouble relaxing 1  Restless 1  Easily annoyed or irritable 1  Afraid - awful might happen 2  Total GAD 7 Score 14  Anxiety Difficulty Not difficult at all    Depression screen [facility] 2/9 10/27/2017 10/27/2017 06/14/2016 02/08/2016 01/21/2016  Decreased Interest 3 0 0 0 0  Down, Depressed, Hopeless 3 1 0 0 0  PHQ - 2 Score 6 1 0 0 0  Altered sleeping 0 - - - -  Tired, decreased energy 0 - - - -  Change in appetite 0 - - - -  Feeling bad or failure about yourself  3 - - - -  Trouble concentrating 0 - - - -  Moving slowly or fidgety/restless 0 - - - -  Suicidal thoughts 0 - - - -  PHQ-9 Score 9 - - - -  Difficult doing work/chores Somewhat difficult - - - -       PMH: hx afib, heart failure, anxiety and depression Tobacco use: never used  Medication:  reviewed and updated ROS: see HPI   Objective:  Physical Exam: BP 118/82   Pulse 77   Temp 98.1 F (36.7 C) (Oral)   Ht 5' 2.5" (1.588 m)   Wt 189 lb 9.6 oz (86 kg)   SpO2 97%   BMI 34.13 kg/m   Gen: 76 year old female NAD, resting comfortably CV: RRR with no murmurs appreciated Pulm: NWOB, CTAB with no crackles, wheezes, or rhonchi GI: Normal bowel sounds present. Soft, Nontender, Nondistended. MSK: no edema, cyanosis, or clubbing noted Skin: warm, dry Neuro: grossly normal, moves all extremities Psych: Normal affect and thought content  No results found for this or any previous visit (from the past 72 hour(s)).   Assessment/Plan:  Situational mixed anxiety and depressive disorder Patient reports history of "situational depression".  Had a long discussion about history of depression and anxiety as noted in the HPI.  Patient was appropriate and does have a GAD7 to 14 and PHQ 9 of 9.  Discussed the option of medication and she feels that she has tried and off over the years and would prefer to try to improve her life on her own.  I think it is reasonable for her to continue her diazepam  as needed nightly.  I recommended that she follow-up with me in 2 months and we can discuss things and make this regular thing, as she does live alone and feels that she is "alone in the world".  She does deny any thoughts of suicide or feelings of self-harm.    Health maintenance Patient is due for pneumonia shot today.  Unfortunately she left the building without getting the shot.  Will address this at her follow-up appointment in 2 months.  Meganne Rita L. Rosalyn Gess, [place] [facility] Resident PGY-2 10/27/2017 2:24 PM

## 2017-11-10 ENCOUNTER — Ambulatory Visit (INDEPENDENT_AMBULATORY_CARE_PROVIDER_SITE_OTHER): Payer: Medicare Other | Admitting: *Deleted

## 2017-11-10 DIAGNOSIS — Z23 Encounter for immunization: Secondary | ICD-10-CM

## 2017-11-10 NOTE — Progress Notes (Signed)
   Patient presents for pneumovax vaccine. Tolerated injection well. VIS statement given. Hubbard Hartshorn, RN, BSN

## 2017-11-15 ENCOUNTER — Other Ambulatory Visit: Payer: Self-pay | Admitting: Family Medicine

## 2017-12-14 ENCOUNTER — Other Ambulatory Visit: Payer: Self-pay

## 2017-12-15 MED ORDER — LORATADINE 10 MG PO TABS: 10.0000 mg | ORAL_TABLET | Freq: Every day | ORAL | 11 refills | Status: DC

## 2018-01-05 DIAGNOSIS — I5032 Chronic diastolic (congestive) heart failure: Secondary | ICD-10-CM | POA: Diagnosis not present

## 2018-01-05 DIAGNOSIS — I482 Chronic atrial fibrillation: Secondary | ICD-10-CM | POA: Diagnosis not present

## 2018-01-05 DIAGNOSIS — I10 Essential (primary) hypertension: Secondary | ICD-10-CM | POA: Diagnosis not present

## 2018-01-29 ENCOUNTER — Other Ambulatory Visit: Payer: Self-pay

## 2018-01-29 MED ORDER — POTASSIUM CHLORIDE 20 MEQ PO PACK: PACK | ORAL | 1 refills | Status: DC

## 2018-02-06 ENCOUNTER — Other Ambulatory Visit: Payer: Self-pay | Admitting: *Deleted

## 2018-02-06 NOTE — Telephone Encounter (Signed)
Per Dr. Nida Boatman last note, the patient was to follow up in 2 months to evaluate if she would still need the diazepam. This was back in December. Please let the patient know that she will need to see me or another provider to discuss her anxiety. I have never evaluated this patient and don't feel comfortable prescribing a controlled substance to someone I have never seen.  Kristen Dawn MD PGY-1 Family Medicine Resident

## 2018-02-09 NOTE — Telephone Encounter (Signed)
Called patient and informed her that she needed an appointment to discuss her anxiety and need for diazepam.  She stated that she has been on it for over 15 years and only takes a half pill. An appointment was made for 02/23/2018 with Dr. Emmaline Life.  Kristen [name], [place]

## 2018-02-13 ENCOUNTER — Ambulatory Visit (INDEPENDENT_AMBULATORY_CARE_PROVIDER_SITE_OTHER): Payer: Medicare Other | Admitting: Internal Medicine

## 2018-02-13 ENCOUNTER — Telehealth: Payer: Self-pay

## 2018-02-13 ENCOUNTER — Other Ambulatory Visit: Payer: Self-pay

## 2018-02-13 ENCOUNTER — Encounter: Payer: Self-pay | Admitting: Internal Medicine

## 2018-02-13 VITALS — BP 98/68 | HR 98 | Temp 98.3°F | Ht 62.0 in | Wt 196.8 lb

## 2018-02-13 DIAGNOSIS — F411 Generalized anxiety disorder: Secondary | ICD-10-CM

## 2018-02-13 DIAGNOSIS — L989 Disorder of the skin and subcutaneous tissue, unspecified: Secondary | ICD-10-CM | POA: Diagnosis not present

## 2018-02-13 DIAGNOSIS — B078 Other viral warts: Secondary | ICD-10-CM | POA: Insufficient documentation

## 2018-02-13 MED ORDER — DIAZEPAM 5 MG PO TABS: 2.5000 mg | ORAL_TABLET | Freq: Every day | ORAL | 0 refills | Status: DC

## 2018-02-13 MED ORDER — MUPIROCIN 2 % EX OINT: 1.0000 "application " | TOPICAL_OINTMENT | Freq: Two times a day (BID) | CUTANEOUS | 0 refills | Status: DC

## 2018-02-13 NOTE — Telephone Encounter (Signed)
Pt called nurse line, states she was just seen by Dr. Emmaline Life and was not given Rx for ointment. Pt advised mupirocin was sent to her pharmacy electronically instead of being given a written rx. Wallace Cullens, RN

## 2018-02-13 NOTE — Patient Instructions (Addendum)
I am going to give an bacterial ointment to use twice daily. If no improvement make sure to follow up with your dermatologist. Please make sure to make appointment with your PCP to discuss your anxiety medication, follow up in about 2-3 weeks

## 2018-02-13 NOTE — Progress Notes (Signed)
   [name] [name], MD Phone: [phone]  Reason For Visit: Anxiety   #Patient was previously followed by Dr. Rosalyn Gess and now currently followed by Dr. Kris Mouton.  Patient is coming in as she needs a prescription of Valium.  She states that she usually takes a half a pill of Valium at night to help her with her sleep.  She states if she does not take this she has racing thoughts and cannot fall asleep.  She feels like she worries but she feels like she is no more worried than anybody else.  She was hard to redirect during this visit and continue to go off on tangents discussing life situations. Denies any alcohol, drug abuse  Gad 7 was 5 PHQ 9 was 6-no concern for SI Mood disorder was negative  Past Medical History Reviewed problem list.  Medications- reviewed and updated No additions to family history Social history- patient is a non-smoker  Objective: BP 98/68   Pulse 98   Temp 98.3 F (36.8 C) (Oral)   Ht 5\' 2"  (1.575 m)   Wt 196 lb 12.8 oz (89.3 kg)   SpO2 97%   BMI 36.00 kg/m  Gen: NAD, alert, cooperative with examng;  MSK: Normal gait and station Skin: dry, intact, no rashes or lesions     Assessment/Plan: See problem based a/p  Anxiety state Patient is very tangential in conversation,limited hygiene noted, difficult to redirect -GAD7, PHQ 9, and Mood disorder screening tool all with normal limits. Given patient's pressured speech was concerned about biopolar disorder, however patient denies any symptoms  - Will refill diazepam for 1 month  - Follow up with PCP

## 2018-02-14 ENCOUNTER — Telehealth: Payer: Self-pay

## 2018-02-14 DIAGNOSIS — Z124 Encounter for screening for malignant neoplasm of cervix: Secondary | ICD-10-CM | POA: Diagnosis not present

## 2018-02-14 DIAGNOSIS — N840 Polyp of corpus uteri: Secondary | ICD-10-CM | POA: Diagnosis not present

## 2018-02-14 NOTE — Telephone Encounter (Signed)
Pt called nurse line wanting to know if we would be able to approve her for a handicap decal for her car. She states she has a h/o AFib and "bad knees" and is hopeful she can get a handicap sticker. Her call back [phone] Wallace Cullens, RN

## 2018-02-15 ENCOUNTER — Encounter: Payer: Self-pay | Admitting: Internal Medicine

## 2018-02-15 ENCOUNTER — Telehealth: Payer: Self-pay

## 2018-02-15 NOTE — Telephone Encounter (Signed)
Patient will need to be seen in order to have the handicap sticker paperwork filled out if appropriate. Will forward to Dr. Emmaline Life who evaluated patient on 4/9 to see if this is appropriate and if she is willing to fill it out. As the PCP usually does this and since I have never seen the patient, there is no way I can fill this out for her. Please call patient to ask if she would like to schedule an appointment to be evaluated for this.  [name] MD PGY-1 Family Medicine Resident

## 2018-02-15 NOTE — Assessment & Plan Note (Signed)
Patient is very tangential in conversation,limited hygiene noted, difficult to redirect -GAD7, PHQ 9, and Mood disorder screening tool all with normal limits. Given patient's pressured speech was concerned about biopolar disorder, however patient denies any symptoms  - Will refill diazepam for 1 month  - Follow up with PCP

## 2018-02-15 NOTE — Telephone Encounter (Signed)
Called patient and made appointment for 03/12/2018 to be evaluated for handicap placard.Ozella Almond, CMA

## 2018-02-20 ENCOUNTER — Other Ambulatory Visit: Payer: Self-pay

## 2018-02-20 ENCOUNTER — Telehealth: Payer: Self-pay | Admitting: Family Medicine

## 2018-02-20 DIAGNOSIS — F411 Generalized anxiety disorder: Secondary | ICD-10-CM

## 2018-02-20 DIAGNOSIS — L989 Disorder of the skin and subcutaneous tissue, unspecified: Secondary | ICD-10-CM

## 2018-02-20 MED ORDER — MUPIROCIN 2 % EX OINT: 1.0000 "application " | TOPICAL_OINTMENT | Freq: Two times a day (BID) | CUTANEOUS | 0 refills | Status: DC

## 2018-02-20 MED ORDER — POTASSIUM CHLORIDE 20 MEQ PO PACK: PACK | ORAL | 1 refills | Status: DC

## 2018-02-20 MED ORDER — DIAZEPAM 5 MG PO TABS: 2.5000 mg | ORAL_TABLET | Freq: Every day | ORAL | 0 refills | Status: DC

## 2018-02-20 MED ORDER — DOXYCYCLINE HYCLATE 100 MG PO TABS: ORAL_TABLET | ORAL | 0 refills | Status: DC

## 2018-02-20 MED ORDER — ALENDRONATE SODIUM 10 MG PO TABS: ORAL_TABLET | ORAL | 0 refills | Status: DC

## 2018-02-20 NOTE — Telephone Encounter (Signed)
[facility] calling for multiple refills for patient. Is patients new pharmacy and needing new rx. Wallace Cullens, RN

## 2018-02-21 ENCOUNTER — Other Ambulatory Visit: Payer: Self-pay | Admitting: Family Medicine

## 2018-02-21 DIAGNOSIS — F411 Generalized anxiety disorder: Secondary | ICD-10-CM

## 2018-02-21 MED ORDER — DIAZEPAM 5 MG PO TABS: 2.5000 mg | ORAL_TABLET | Freq: Every day | ORAL | 0 refills | Status: DC

## 2018-02-21 NOTE — Progress Notes (Signed)
Pt informed. Kristen [name], CMA  

## 2018-02-21 NOTE — Telephone Encounter (Signed)
Encounter entered in error.

## 2018-02-21 NOTE — Progress Notes (Signed)
For some reason I was unable to e-prescribe patient's valium. Please let patient know this is ready under the "s" folder up front.  [name] MD PGY-1 Family Medicine Resident

## 2018-02-22 ENCOUNTER — Other Ambulatory Visit: Payer: Self-pay | Admitting: Family Medicine

## 2018-03-05 ENCOUNTER — Other Ambulatory Visit: Payer: Self-pay | Admitting: Dermatology

## 2018-03-05 DIAGNOSIS — L820 Inflamed seborrheic keratosis: Secondary | ICD-10-CM | POA: Diagnosis not present

## 2018-03-05 DIAGNOSIS — D485 Neoplasm of uncertain behavior of skin: Secondary | ICD-10-CM | POA: Diagnosis not present

## 2018-03-05 DIAGNOSIS — B078 Other viral warts: Secondary | ICD-10-CM | POA: Diagnosis not present

## 2018-03-12 ENCOUNTER — Other Ambulatory Visit: Payer: Self-pay

## 2018-03-12 ENCOUNTER — Ambulatory Visit (INDEPENDENT_AMBULATORY_CARE_PROVIDER_SITE_OTHER): Payer: Medicare Other | Admitting: Family Medicine

## 2018-03-12 ENCOUNTER — Encounter: Payer: Self-pay | Admitting: Family Medicine

## 2018-03-12 VITALS — BP 108/60 | HR 74 | Temp 97.6°F | Wt 197.4 lb

## 2018-03-12 DIAGNOSIS — L989 Disorder of the skin and subcutaneous tissue, unspecified: Secondary | ICD-10-CM

## 2018-03-12 DIAGNOSIS — R269 Unspecified abnormalities of gait and mobility: Secondary | ICD-10-CM

## 2018-03-12 DIAGNOSIS — M170 Bilateral primary osteoarthritis of knee: Secondary | ICD-10-CM | POA: Diagnosis not present

## 2018-03-12 MED ORDER — CLINDAMYCIN PHOSPHATE 1 % EX GEL
CUTANEOUS | 0 refills | Status: DC
Start: 2018-03-12 — End: 2018-04-13

## 2018-03-12 NOTE — Patient Instructions (Signed)
It was great meeting you today! Today we discussed how your medical problems were limiting your mobility. For that I filled out a handicap placard sheet. Regarding your right forearm skin, I am giving you a prescription for clindamycin gel. You will take this and rub this is 3 times per day. After your rub this is successfully, apply vaseline to the area. If this area does not look better in 4-5 days, please come back and see [place].

## 2018-03-13 DIAGNOSIS — H5203 Hypermetropia, bilateral: Secondary | ICD-10-CM | POA: Diagnosis not present

## 2018-03-13 DIAGNOSIS — H524 Presbyopia: Secondary | ICD-10-CM | POA: Diagnosis not present

## 2018-03-13 DIAGNOSIS — M1990 Unspecified osteoarthritis, unspecified site: Secondary | ICD-10-CM | POA: Insufficient documentation

## 2018-03-13 DIAGNOSIS — H52223 Regular astigmatism, bilateral: Secondary | ICD-10-CM | POA: Diagnosis not present

## 2018-03-13 DIAGNOSIS — R269 Unspecified abnormalities of gait and mobility: Secondary | ICD-10-CM | POA: Insufficient documentation

## 2018-03-13 NOTE — Progress Notes (Signed)
   HPI 77 year old female who presents for evaluation for handicap parking decal. 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 Patient unable to walk over 200 feet without assistance.  Patient also presents for evaluation of a healing wound on her LUE. She states that she had "an area taken off" by dermatology. This was done about a week ago and she feels like it is not healing properly. Has not been following her wound care instructions. As opposed to keeping area open to air with barrier protection via vaseline, she has not been applying vaseline, using bacitracin ointment instead and keeping a bandaid over the area.   CC: handicap parking decal, spot on arm   ROS:  Review of Systems See HPI for ROS.   CC, SH/smoking status, and VS noted  Objective: BP 108/60 (BP Location: Left Arm)   Pulse 74   Temp 97.6 F (36.4 C) (Oral)   Wt 89.5 kg (197 lb 6.4 oz)   BMI 36.10 kg/m  Gen: NAD, alert, cooperative, and pleasant. Elderly caucasian female, no acute distress HEENT: NCAT, EOMI, PERRL CV: RRR, no murmur Resp: CTAB, no wheezes, non-labored Abd: SNTND, BS present, no guarding or organomegaly Ext: No edema, warm Neuro: Alert and oriented, Speech clear, No gross deficits, cn 2-12 intact, no focal motor deficit, 5/5 strength Gait: noticeable difficulty with ambulation, uneven gait, noticeable pain with ambulation Right arm: 1cm x 1cm healing, circumscribed excision site. Some erythema surrounding excision site.    Assessment and plan:  Osteoarthritis Patient with degeneration of both knees. While she does have other factors causing her difficulty with ambulation her OA is biggest reason. Filled out handicap placard as patient is unable to ambulate anywhere close to 215ft without assistance.  Gait abnormality Gait abnormality 2/2 chronic OA pain in knees. Gave handicap placard.  Skin lesion Skin  lesion s/p excision by dermatology. Some erythema surrounding wound. No wamth, no fevers. Given that it has been over a week since excision, will try with clindamycin gel. Tid for 7 days. Gave strict return precautions.   No orders of the defined types were placed in this encounter.   Meds ordered this encounter  Medications  . clindamycin (CLINDAGEL) 1 % gel    Sig: Apply to affected area 2 times daily    Dispense:  30 g    Refill:  0     [name] MD PGY-1 Family Medicine Resident  03/13/2018 1:42 PM

## 2018-03-13 NOTE — Assessment & Plan Note (Addendum)
Patient with degeneration of both knees. While she does have other factors causing her difficulty with ambulation her OA is biggest reason. Filled out handicap placard as patient is unable to ambulate anywhere close to 272ft without assistance.

## 2018-03-13 NOTE — Assessment & Plan Note (Signed)
Gait abnormality 2/2 chronic OA pain in knees. Gave handicap placard.

## 2018-03-13 NOTE — Assessment & Plan Note (Addendum)
Skin lesion s/p excision by dermatology. Some erythema surrounding wound. No wamth, no fevers. Given that it has been over a week since excision, will try with clindamycin gel. Tid for 7 days. Gave strict return precautions.

## 2018-03-20 ENCOUNTER — Other Ambulatory Visit: Payer: Self-pay

## 2018-03-20 DIAGNOSIS — F411 Generalized anxiety disorder: Secondary | ICD-10-CM

## 2018-03-20 MED ORDER — DIAZEPAM 5 MG PO TABS: 2.5000 mg | ORAL_TABLET | Freq: Every day | ORAL | 0 refills | Status: DC

## 2018-03-20 NOTE — Telephone Encounter (Signed)
Pt requesting refill of [facility]iazep[facility] to [facility] Wallace Cullens, RN

## 2018-03-23 DIAGNOSIS — M1712 Unilateral primary osteoarthritis, left knee: Secondary | ICD-10-CM | POA: Diagnosis not present

## 2018-03-26 ENCOUNTER — Telehealth: Payer: Self-pay

## 2018-03-26 MED ORDER — DOXYCYCLINE HYCLATE 100 MG PO TABS: ORAL_TABLET | ORAL | 0 refills | Status: DC

## 2018-03-27 ENCOUNTER — Other Ambulatory Visit: Payer: Self-pay | Admitting: Family Medicine

## 2018-03-27 DIAGNOSIS — L989 Disorder of the skin and subcutaneous tissue, unspecified: Secondary | ICD-10-CM

## 2018-03-29 ENCOUNTER — Other Ambulatory Visit: Payer: Self-pay | Admitting: *Deleted

## 2018-03-30 MED ORDER — ALENDRONATE SODIUM 70 MG PO TABS
ORAL_TABLET | ORAL | 0 refills | Status: DC
Start: 2018-03-30 — End: 2018-04-25

## 2018-04-06 MED ORDER — DOXYCYCLINE HYCLATE 100 MG PO TABS
ORAL_TABLET | ORAL | 0 refills | Status: DC
Start: 2018-04-06 — End: 2018-04-13

## 2018-04-06 NOTE — Telephone Encounter (Signed)
[facility] called regarding rx- stating doxycycline tablets require prior authorization- insurance will cover capsules. Can you send new rx for capsules? Thanks! Wallace Cullens, RN

## 2018-04-06 NOTE — Telephone Encounter (Signed)
Sent in new prescription for medication.  [name] MD PGY-1 Family Medicine Resident

## 2018-04-06 NOTE — Addendum Note (Signed)
Addended by: Pauletta Browns on: 04/06/2018 02:30 PM   Modules accepted: Orders

## 2018-04-09 ENCOUNTER — Other Ambulatory Visit: Payer: Self-pay | Admitting: *Deleted

## 2018-04-09 MED ORDER — LORATADINE 10 MG PO TABS: 10.0000 mg | ORAL_TABLET | Freq: Every day | ORAL | 11 refills | Status: DC

## 2018-04-11 ENCOUNTER — Other Ambulatory Visit: Payer: Self-pay | Admitting: Family Medicine

## 2018-04-12 ENCOUNTER — Telehealth: Payer: Self-pay

## 2018-04-12 MED ORDER — LORATADINE 10 MG PO TABS: 10.0000 mg | ORAL_TABLET | Freq: Every day | ORAL | 11 refills | Status: DC

## 2018-04-12 NOTE — Telephone Encounter (Signed)
Loratadine resent to [facility] per patient request.   Patient is also awaiting Doxycycline Rx. According to 03/26/18 phone note capsule form was to be sent but tablet was sent. Please send capsule form if appropriate to [facility].  Call back is [phone]  Danley Danker, RN [facility] [place])

## 2018-04-13 ENCOUNTER — Other Ambulatory Visit: Payer: Self-pay | Admitting: Family Medicine

## 2018-04-13 MED ORDER — DOXYCYCLINE HYCLATE 50 MG PO CAPS: 100.0000 mg | ORAL_CAPSULE | Freq: Two times a day (BID) | ORAL | 0 refills | Status: DC

## 2018-04-24 DIAGNOSIS — L821 Other seborrheic keratosis: Secondary | ICD-10-CM | POA: Diagnosis not present

## 2018-04-24 DIAGNOSIS — L439 Lichen planus, unspecified: Secondary | ICD-10-CM | POA: Diagnosis not present

## 2018-04-24 DIAGNOSIS — L570 Actinic keratosis: Secondary | ICD-10-CM | POA: Diagnosis not present

## 2018-04-25 ENCOUNTER — Other Ambulatory Visit: Payer: Self-pay | Admitting: Family Medicine

## 2018-05-01 ENCOUNTER — Other Ambulatory Visit: Payer: Self-pay | Admitting: Family Medicine

## 2018-05-09 ENCOUNTER — Other Ambulatory Visit: Payer: Self-pay

## 2018-05-09 DIAGNOSIS — F411 Generalized anxiety disorder: Secondary | ICD-10-CM

## 2018-05-09 MED ORDER — DIAZEPAM 5 MG PO TABS: 2.5000 mg | ORAL_TABLET | Freq: Every day | ORAL | 0 refills | Status: DC

## 2018-05-17 DIAGNOSIS — M25571 Pain in right ankle and joints of right foot: Secondary | ICD-10-CM | POA: Diagnosis not present

## 2018-05-29 DIAGNOSIS — L570 Actinic keratosis: Secondary | ICD-10-CM | POA: Diagnosis not present

## 2018-05-29 DIAGNOSIS — D692 Other nonthrombocytopenic purpura: Secondary | ICD-10-CM | POA: Diagnosis not present

## 2018-06-05 ENCOUNTER — Ambulatory Visit (INDEPENDENT_AMBULATORY_CARE_PROVIDER_SITE_OTHER): Payer: Medicare Other | Admitting: Orthopedic Surgery

## 2018-06-05 DIAGNOSIS — M1A0710 Idiopathic chronic gout, right ankle and foot, without tophus (tophi): Secondary | ICD-10-CM

## 2018-06-05 NOTE — Progress Notes (Signed)
Office Visit Note   Patient: Kristen [name]           Date of Birth: 08/29/1941           MRN: [id number] Visit Date: 06/05/2018              Requested by: [name], MD [phone], [place] [address] PCP: [name], MD  Chief Complaint  Patient presents with  . Right Foot - Pain      HPI: Patient is a 77 year old woman who presents complaining of right great toe MTP joint pain redness and swelling.  Patient states that she has had a history of a bunionectomy and is concerned this may be from her previous surgery.  She has just completed a course of Keflex as well as a course of colchicine.  Her previous bunion surgery was about 12 years ago with a bunionectomy and Weil osteotomy.  Assessment & Plan: Visit Diagnoses: No diagnosis found.  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  We will draw a uric acid we will call her with the results and start gout medication after the cultures are obtained.  Follow-Up Instructions: No follow-ups on file.   Ortho Exam  Patient is alert, oriented, no adenopathy, well-dressed, normal affect, normal respiratory effort. Examination patient has a good dorsalis pedis pulse there is no progression of her bunion deformity she has slight clawing of the second toe.  There is venous stasis swelling she has an antalgic gait.  There is redness and pain to palpation around the right great toe MTP joint with pain with range of motion there is no hallux rigidus.  Imaging: No results found. No images are attached to the encounter.  Labs: Lab Results  Component Value Date   HGBA1C 5.3 02/15/2016   HGBA1C 5.6 06/09/2014   HGBA1C 5.6 10/10/2013   REPTSTATUS 12/17/2010 FINAL 12/15/2010   GRAMSTAIN  12/15/2010    FEW WBC PRESENT,BOTH PMN AND MONONUCLEAR NO SQUAMOUS EPITHELIAL CELLS SEEN NO ORGANISMS SEEN   CULT NO GROWTH 2 DAYS 12/15/2010     Lab Results  Component Value Date   ALBUMIN  4.1 03/25/2013   ALBUMIN 3.9 04/13/2012   ALBUMIN 4.4 01/31/2008    There is no height or weight on file to calculate BMI.  Orders:  No orders of the defined types were placed in this encounter.  No orders of the defined types were placed in this encounter.    Procedures: No procedures performed  Clinical Data: No additional findings.  ROS:  All other systems negative, except as noted in the HPI. Review of Systems  Objective: Vital Signs: There were no vitals taken for this visit.  Specialty Comments:  No specialty comments available.  PMFS History: Patient Active Problem List   Diagnosis Date Noted  . Osteoarthritis 03/13/2018  . Gait abnormality 03/13/2018  . Skin lesion 02/13/2018  . 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 Abnormal Pap smear of cervix 02/08/2016  . Chronic kidney disease 02/08/2016  . Urinary frequency 02/08/2016  . History of diverticulitis 01/22/2016  . Anxiety state 10/17/2014  . Long-term (current) use of anticoagulants 06/10/2014  . Chronic venous insufficiency 06/10/2014  . Obesity (BMI 30-39.9)   . Chronic diastolic heart failure ([place])   . Candidal intertrigo 06/09/2014  . Nocturnal leg cramps 04/13/2012  . Insomnia 01/10/2011  . Atrial fibrillation ([place])   . OSTEOPENIA 01/31/2008  . Hypertensive heart disease  Past Medical History:  Diagnosis Date  . Anxiety   . Atrial fibrillation ([place])   . Cardiac dysrhythmia, unspecified   . Depression   . Diverticula of colon   . Essential hypertension, benign   . Fatigue   . Fibrocystic breast disease 1980s   General Surgery aspirates  . History of diverticulitis 01/22/2016  . Irregular heart beat 2012  . Lumbar back pain   . Need for prophylactic vaccination and inoculation against influenza   . Osteopenia   . Scoliosis   . Urinary tract infection     Family History  Problem Relation Age of Onset  . Cancer Mother        ovarian  . Kidney disease  Father   . Alcohol abuse Father   . Cancer Maternal Aunt        lung- smoker  . Heart disease Maternal Uncle     Past Surgical History:  Procedure Laterality Date  . BREAST CYST ASPIRATION    . BREAST EXCISIONAL BIOPSY    . BREAST SURGERY  1980   cyst removed  . BUNIONECTOMY  2003   Dr Sharol Given  . Eyelid surgery  2006  . KNEE CARTILAGE SURGERY  2002   Dr Para March  . TUBAL LIGATION  1980   Social History   Occupational History  . Occupation: Retired- Product manager: RETIRED  Tobacco Use  . Smoking status: Never Smoker  . Smokeless tobacco: Never Used  Substance and Sexual Activity  . Alcohol use: Yes    Comment: once a month  . Drug use: No  . Sexual activity: Not on file

## 2018-06-06 ENCOUNTER — Encounter (INDEPENDENT_AMBULATORY_CARE_PROVIDER_SITE_OTHER): Payer: Self-pay | Admitting: Orthopedic Surgery

## 2018-06-06 ENCOUNTER — Other Ambulatory Visit (INDEPENDENT_AMBULATORY_CARE_PROVIDER_SITE_OTHER): Payer: Self-pay

## 2018-06-06 LAB — URIC ACID: Uric Acid, Serum: 8.9 mg/dL — ABNORMAL HIGH (ref 2.5–7.0)

## 2018-06-06 MED ORDER — ALLOPURINOL 100 MG PO TABS: 100.0000 mg | ORAL_TABLET | Freq: Every day | ORAL | 1 refills | Status: DC

## 2018-06-06 MED ORDER — COLCHICINE 0.6 MG PO TABS: 0.6000 mg | ORAL_TABLET | Freq: Two times a day (BID) | ORAL | 1 refills | Status: DC

## 2018-06-08 ENCOUNTER — Telehealth (INDEPENDENT_AMBULATORY_CARE_PROVIDER_SITE_OTHER): Payer: Self-pay | Admitting: Orthopedic Surgery

## 2018-06-08 NOTE — Telephone Encounter (Signed)
Called patient left message to return call to schedule 4 wk follow up appointment with Dr Sharol Given or Junie Panning

## 2018-06-11 ENCOUNTER — Telehealth (INDEPENDENT_AMBULATORY_CARE_PROVIDER_SITE_OTHER): Payer: Self-pay

## 2018-06-11 NOTE — Telephone Encounter (Signed)
Patient would like a call back concerning her lab results.  Cb# is [phone].  Please advise.

## 2018-06-12 NOTE — Telephone Encounter (Signed)
Called and lm on vm to advise to call the office leave a message with detailed questions so that way I can relay the information to Dr. Sharol Given and call back with answers.

## 2018-06-13 NOTE — Telephone Encounter (Signed)
Patient called back stating that she was wanting to the results in her Uric Acid test to see if she had Gout.  CB#[phone].  Thank you.

## 2018-06-14 ENCOUNTER — Telehealth (INDEPENDENT_AMBULATORY_CARE_PROVIDER_SITE_OTHER): Payer: Self-pay | Admitting: Orthopedic Surgery

## 2018-06-14 NOTE — Telephone Encounter (Signed)
This is a duplicate.

## 2018-06-14 NOTE — Telephone Encounter (Signed)
Called and reviewed medication with the pt and lab results. She will call with any questions and we will see her in several weeks to recheck her lab levels. Will also mail her a list of foods to avoid and foods to increase

## 2018-06-14 NOTE — Telephone Encounter (Signed)
Patient called asked what were the results of her blood work. The number to contact patient is [phone]

## 2018-06-15 MED ORDER — COLCHICINE 0.6 MG PO TABS: 0.6000 mg | ORAL_TABLET | Freq: Two times a day (BID) | ORAL | 1 refills | Status: DC

## 2018-06-15 MED ORDER — ALLOPURINOL 100 MG PO TABS: 100.0000 mg | ORAL_TABLET | Freq: Every day | ORAL | 1 refills | Status: DC

## 2018-06-15 NOTE — Telephone Encounter (Signed)
Resubmitted both to correct pharmacy.

## 2018-06-15 NOTE — Addendum Note (Signed)
Addended byLaurann [place] on: 06/15/2018 04:16 PM   Modules accepted: Orders

## 2018-06-15 NOTE — Telephone Encounter (Signed)
Patient needs the two medications prescribed to her at the last visit [facility] [address] They were sent to [facility] in [place] instead.

## 2018-06-19 ENCOUNTER — Telehealth (INDEPENDENT_AMBULATORY_CARE_PROVIDER_SITE_OTHER): Payer: Self-pay | Admitting: Orthopedic Surgery

## 2018-06-19 NOTE — Telephone Encounter (Signed)
Patient called left voicemail message stating the insurance will not cover the Rx for gout until after 07/07/18. Patient asked if there is another medication that can be called in for her. The number to contact patient is [phone]

## 2018-06-21 NOTE — Telephone Encounter (Signed)
Find out what rx won't be refilled, she only needs to take the Allopurinol 100mg  bid, does not need to be taking the colcrys daily

## 2018-06-21 NOTE — Telephone Encounter (Signed)
IC pharm and they did say that ins will not cover Rx until 07/07/18 but no reason given.  The colchicine is the med in question.  [facility] and patient will call ins company to see what the issue is with the colchicine, and call me back.

## 2018-06-21 NOTE — Telephone Encounter (Signed)
IC patient and she does not know which Rx it is.  [facility] [address]  Will call to inquire.

## 2018-06-21 NOTE — Telephone Encounter (Signed)
Please advise 

## 2018-06-22 NOTE — Telephone Encounter (Signed)
FYI to you for when patient calls back, she will still need colchicine, but the local pharm was going to fill the allopurinol.

## 2018-06-28 NOTE — Telephone Encounter (Signed)
Patient wanted to make you aware she just got her prescription last night, that she had a hard time being able to get it. # [phone]

## 2018-07-03 ENCOUNTER — Encounter (INDEPENDENT_AMBULATORY_CARE_PROVIDER_SITE_OTHER): Payer: Self-pay | Admitting: Orthopedic Surgery

## 2018-07-03 ENCOUNTER — Ambulatory Visit (INDEPENDENT_AMBULATORY_CARE_PROVIDER_SITE_OTHER): Payer: Medicare Other | Admitting: Physician Assistant

## 2018-07-03 VITALS — Ht 62.0 in | Wt 197.0 lb

## 2018-07-03 DIAGNOSIS — M1A0710 Idiopathic chronic gout, right ankle and foot, without tophus (tophi): Secondary | ICD-10-CM

## 2018-07-03 NOTE — Progress Notes (Signed)
Office Visit Note   Patient: Kristen [name]           Date of Birth: Apr 16, 1941           MRN: [id number] Visit Date: 07/03/2018              Requested by: [name], MD [phone], [place] [address] PCP: [name], MD   Assessment & Plan: Visit Diagnoses:  1. Chronic idiopathic gout involving toe of right foot without tophus     Plan: The patient has apparently only picked up her colchicine.  She has been taking this twice daily.  Have recommended that now that acute gout flare has improved, that she should go ahead and pick up her allopurinol prescription and begin taking 100 mg twice daily.  Counseled that we would like to see her back in 4 weeks following initiation of allopurinol therapy to recheck her uric acid level.  Follow-Up Instructions: Return in about 4 weeks (around 07/31/2018).   Orders:  No orders of the defined types were placed in this encounter.  No orders of the defined types were placed in this encounter.     Procedures: No procedures performed   Clinical Data: No additional findings.   Subjective: Chief Complaint  Patient presents with  . Right Foot - Follow-up    HPI Patient is a 77 year old female who is seen in follow-up following treatment for her acute gout flare of her right great toe MTP joint.  She reports that she only recently started on her colchicine and her gouty symptoms including redness swelling and pain have all improved.  She is now able to walk with almost no pain.  She has developed some pain in her second metacarpal of the left hand and wondered if this might be related to her gout as well.  She was a little concerned about starting the allopurinol and did not understand that the allopurinol was to hopefully prevent further gout flares in lower her uric acid level and this was discussed.  Counseled that we we will have her start her allopurinol at this point and she should take 100 mg twice daily.  We  will then recheck her uric acid level in about a month.  Her uric acid level at last visit was 8.9. Review of Systems   Objective: Vital Signs: Ht 5\' 2"  (1.575 m)   Wt 197 lb (89.4 kg)   BMI 36.03 kg/m   Physical Exam Patient is a well-nourished well-developed elderly female who ambulates without an assistive device and with a nonantalgic gait. Ortho Exam Examination of the right great toe she has some very mild residual erythema and very mild edema but she is nontender to palpation.  She has good pulses after the foot.  There is no pain with range of motion of the MTP joint.  There is no hallux rigidus. Specialty Comments:  No specialty comments available.  Imaging: No results found.   PMFS History: Patient Active Problem List   Diagnosis Date Noted  . Osteoarthritis 03/13/2018  . Gait abnormality 03/13/2018  . Skin lesion 02/13/2018  . 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 Abnormal Pap smear of cervix 02/08/2016  . Chronic kidney disease 02/08/2016  . Urinary frequency 02/08/2016  . History of diverticulitis 01/22/2016  . Anxiety state 10/17/2014  . Long-term (current) use of anticoagulants 06/10/2014  . Chronic venous insufficiency 06/10/2014  . Obesity (BMI 30-39.9)   . Chronic diastolic heart failure (  [place])   . [place] intertrigo 06/09/2014  . Nocturnal leg cramps 04/13/2012  . Insomnia 01/10/2011  . Atrial fibrillation ([place])   . OSTEOPENIA 01/31/2008  . Hypertensive heart disease    Past Medical History:  Diagnosis Date  . Anxiety   . Atrial fibrillation ([place])   . Cardiac dysrhythmia, unspecified   . Depression   . Diverticula of colon   . Essential hypertension, benign   . Fatigue   . Fibrocystic breast disease 1980s   General Surgery aspirates  . History of diverticulitis 01/22/2016  . Irregular heart beat 2012  . Lumbar back pain   . Need for prophylactic vaccination and inoculation against influenza   . Osteopenia   .  Scoliosis   . Urinary tract infection     Family History  Problem Relation Age of Onset  . Cancer Mother        ovarian  . Kidney disease Father   . Alcohol abuse Father   . Cancer Maternal Aunt        lung- smoker  . Heart disease Maternal Uncle     Past Surgical History:  Procedure Laterality Date  . BREAST CYST ASPIRATION    . BREAST EXCISIONAL BIOPSY    . BREAST SURGERY  1980   cyst removed  . BUNIONECTOMY  2003   Dr Sharol Given  . Eyelid surgery  2006  . KNEE CARTILAGE SURGERY  2002   Dr Para March  . TUBAL LIGATION  1980   Social History   Occupational History  . Occupation: Retired- Product manager: RETIRED  Tobacco Use  . Smoking status: Never Smoker  . Smokeless tobacco: Never Used  Substance and Sexual Activity  . Alcohol use: Yes    Comment: once a month  . Drug use: No  . Sexual activity: Not on file

## 2018-07-20 ENCOUNTER — Other Ambulatory Visit: Payer: Self-pay

## 2018-07-20 DIAGNOSIS — F411 Generalized anxiety disorder: Secondary | ICD-10-CM

## 2018-07-23 MED ORDER — DIAZEPAM 5 MG PO TABS: 2.5000 mg | ORAL_TABLET | Freq: Every day | ORAL | 0 refills | Status: DC

## 2018-07-30 ENCOUNTER — Other Ambulatory Visit (INDEPENDENT_AMBULATORY_CARE_PROVIDER_SITE_OTHER): Payer: Self-pay | Admitting: Orthopedic Surgery

## 2018-07-30 NOTE — Telephone Encounter (Signed)
Ok for refill? 

## 2018-08-02 ENCOUNTER — Ambulatory Visit (INDEPENDENT_AMBULATORY_CARE_PROVIDER_SITE_OTHER): Payer: TRICARE For Life (TFL) | Admitting: Orthopedic Surgery

## 2018-09-03 DIAGNOSIS — R87610 Atypical squamous cells of undetermined significance on cytologic smear of cervix (ASC-US): Secondary | ICD-10-CM | POA: Diagnosis not present

## 2018-09-04 ENCOUNTER — Telehealth: Payer: Self-pay | Admitting: Family Medicine

## 2018-09-04 NOTE — Telephone Encounter (Signed)
Shredded prescription for Valium, dated 02/21/18.

## 2018-09-13 DIAGNOSIS — M170 Bilateral primary osteoarthritis of knee: Secondary | ICD-10-CM | POA: Diagnosis not present

## 2018-09-17 ENCOUNTER — Telehealth: Payer: Self-pay | Admitting: Cardiology

## 2018-09-17 NOTE — Telephone Encounter (Signed)
LAM for patient to return call to schedule appt for pro-op clearance

## 2018-09-19 ENCOUNTER — Telehealth: Payer: Self-pay | Admitting: Cardiology

## 2018-09-19 NOTE — Telephone Encounter (Signed)
LAM for patient to return call to schedule pre-op visit and clearance.

## 2018-09-19 NOTE — Telephone Encounter (Signed)
After leaving several messages to call and schedule pre-op clearance, patient returned call and wanted to put off surgical clearance visit until after first of 2020. I also notified Raliegh Ip of this decision.

## 2018-09-28 ENCOUNTER — Telehealth: Payer: Self-pay | Admitting: Cardiology

## 2018-09-28 NOTE — Telephone Encounter (Signed)
Patient took her pradaxa on an empty stomach and later got a nosebleed that was bright red, then she saw a miniscule of blood in her bowel movement the same day. Nothing since, does she need to worry or be seen? She also takes metoprolol and lisinopril

## 2018-10-01 NOTE — Telephone Encounter (Signed)
Patient reports a nose bleed on Thursday, 09/27/18 after taking pradaxa on an empty stomach. Later that day, she noticed a small blood spot in her stool. Patient is taking pradaxa 150 mg twice daily as prescribed. Patient denies any unusual bleeding or black tarry stools since Thursday and has no further complaints. Advised patient to contact our office if she experiences any further bleeding or black tarry stools. Patient verbalized understanding. No further questions.

## 2018-10-09 ENCOUNTER — Ambulatory Visit: Payer: Medicare Other

## 2018-10-11 ENCOUNTER — Ambulatory Visit (INDEPENDENT_AMBULATORY_CARE_PROVIDER_SITE_OTHER): Payer: Medicare Other

## 2018-10-11 ENCOUNTER — Other Ambulatory Visit: Payer: Self-pay

## 2018-10-11 VITALS — BP 120/74 | HR 60 | Temp 98.2°F | Ht 62.0 in | Wt 198.4 lb

## 2018-10-11 DIAGNOSIS — Z0000 Encounter for general adult medical examination without abnormal findings: Secondary | ICD-10-CM | POA: Diagnosis not present

## 2018-10-11 DIAGNOSIS — F411 Generalized anxiety disorder: Secondary | ICD-10-CM

## 2018-10-11 MED ORDER — DIAZEPAM 5 MG PO TABS: 2.5000 mg | ORAL_TABLET | Freq: Every day | ORAL | 0 refills | Status: DC

## 2018-10-11 NOTE — Progress Notes (Signed)
Subjective:   Kristen [name] is a 77 y.o. female who presents for Medicare Annual (Subsequent) preventive examination.  Review of Systems:  Physical assessment deferred to PCP.  Cardiac Risk Factors include: advanced age (>41men, >24 women);obesity (BMI >30kg/m2);hypertension     Objective:    Vitals: BP 120/74   Pulse 60   Temp 98.2 F (36.8 C) (Oral)   Ht 5\' 2"  (1.575 m)   Wt 198 lb 6.4 oz (90 kg)   SpO2 94%   BMI 36.29 kg/m   Body mass index is 36.29 kg/m.  Advanced Directives 10/11/2018 10/27/2016 06/14/2016 07/10/2015 10/15/2014  Does Patient Have a Medical Advance Directive? No Yes No No No  Type of Advance Directive - Living will - - -  Would patient like information on creating a medical advance directive? Yes (MAU/Ambulatory/Procedural Areas - Information given) - No - patient declined information No - patient declined information Yes Higher education careers adviser given    Tobacco Social History   Tobacco Use  Smoking Status Never Smoker  Smokeless Tobacco Never Used     Counseling given: Not Answered   Clinical Intake:  Pre-visit preparation completed: Yes  Pain : No/denies pain Pain Score: 0-No pain     Nutritional Status: BMI > 30  Obese Nutritional Risks: None Diabetes: No  How often do you need to have someone help you when you read instructions, pamphlets, or other written materials from your doctor or pharmacy?: 1 - Never What is the last grade level you completed in school?: 18  Interpreter Needed?: No    Past Medical History:  Diagnosis Date  . Anxiety   . Atrial fibrillation ([place])   . Cardiac dysrhythmia, unspecified   . Cataract   . Depression   . Diverticula of colon   . Diverticulosis   . Essential hypertension, benign   . Fatigue   . Fibrocystic breast disease 1980s   General Surgery aspirates  . Gout   . History of diverticulitis 01/22/2016  . Irregular heart beat 2012  . Lumbar back pain   . Need for prophylactic  vaccination and inoculation against influenza   . Osteopenia   . Scoliosis   . Urinary tract infection    Past Surgical History:  Procedure Laterality Date  . BREAST CYST ASPIRATION    . BREAST EXCISIONAL BIOPSY    . BREAST SURGERY  1980   cyst removed  . BUNIONECTOMY  2003   Dr Sharol Given  . cataract    . Eyelid surgery  2006  . KNEE CARTILAGE SURGERY  2002   Dr Para March  . skin growth    . TUBAL LIGATION  1980   Family History  Problem Relation Age of Onset  . Cancer Mother        ovarian  . Kidney disease Father   . Alcohol abuse Father   . Cancer Maternal Aunt        lung- smoker  . Heart disease Maternal Uncle    Social History   Socioeconomic History  . Marital status: Widowed    Spouse name: [name]  . Number of children: 1  . Years of education: Masters  . Highest education level: Master's degree (e.g., MA, MS, MEng, MEd, MSW, MBA)  Occupational History  . Occupation: Retired- Product manager: RETIRED  Social Needs  . Financial resource strain: Not hard at all  . Food insecurity:    Worry: Never true    Inability: Never true  .  Transportation needs:    Medical: No    Non-medical: No  Tobacco Use  . Smoking status: Never Smoker  . Smokeless tobacco: Never Used  Substance and Sexual Activity  . Alcohol use: Yes    Comment: rarely  . Drug use: No  . Sexual activity: Not Currently  Lifestyle  . Physical activity:    Days per week: 0 days    Minutes per session: 0 min  . Stress: Not at all  Relationships  . Social connections:    Talks on phone: Three times a week    Gets together: Never    Attends religious service: Never    Active member of club or organization: Yes    Attends meetings of clubs or organizations: 1 to 4 times per year    Relationship status: Widowed  Other Topics Concern  . Not on file  Social History Narrative   Emergency Contact: Leta Baptist [phone]   Who lives with you: self   Cats as pets and takes care of feral cats  also      Diet: Pt has a varied diet of protein, starch, and vegetables. Exercise: Pt dances regularly for shows   Seatbelts: Pt reports wearing seatbelt when in vehicles.    Sun Exposure/Protection: Pt reports not being in the sun very much   Hobbies: dancing, painting,       Working smoke alarm: yes      Home throw rugs: yes         Home free from clutter: yes                Outpatient Encounter Medications as of 10/11/2018  Medication Sig  . alendronate (FOSAMAX) 10 MG tablet TAKE (1) TABLET BY MOUTH PER WEEK TAKE ON EMPTY STOMACH WITH FULL GLASS OF WATER  . allopurinol (ZYLOPRIM) 100 MG tablet Take 1 tablet (100 mg total) by mouth daily.  . Ascorbic Acid (VITAMIN C) 100 MG tablet Take 100 mg by mouth daily.    Marland Kitchen b complex vitamins tablet Take 1 tablet by mouth daily.    . Biotin 1000 MCG tablet Take 1,000 mcg by mouth 3 (three) times daily.  . calcium citrate-vitamin D (CITRACAL+D) 315-200 MG-UNIT per tablet Take 1 tablet by mouth daily.    . colchicine 0.6 MG tablet Take 1 tablet (0.6 mg total) by mouth 2 (two) times daily. Take twice daily until pain resolves and then take as needed for gout flare.  . colchicine 0.6 MG tablet TAKE ONE (1) TABLET BY MOUTH TWICE DAILY UNTIL PAIN RESOLVES AND THEN TAKE AS NEEDED FOR GOUT FLARE  . [facility] ANTI-FUNGAL 2 % powder APPLY TO AFFECTED AREA TWICE A DAY AFTER SKIN RESOLVES FROM KETOCONAZOLE CREAM (Patient taking differently: APPLY TO AFFECTED AREA TWICE A DAY AFTER SKIN RESOLVES FROM KETOCONAZOLE CREAM as needed)  . Dabigatran Etexilate Mesylate (PRADAXA) 150 MG CAPS Take 150 mg by mouth every 12 (twelve) hours.    . diazepam (VALIUM) 5 MG tablet Take 0.5 tablets (2.5 mg total) by mouth at bedtime.  Marland Kitchen doxycycline (VIBRAMYCIN) 50 MG capsule TAKE 2 CAPSULES BY MOUTH 2 (TWO) TIMES DAILY.  . fish oil-omega-3 fatty acids 1000 MG capsule Take 2 g by mouth daily.  . furosemide (LASIX) 20 MG tablet Take 1 tablet (20 mg total) by mouth daily.  Marland Kitchen  glucosamine-chondroitin (GLUCOSAMINE-CHONDROITIN DS) 500-400 MG tablet Take 1 tablet by mouth.  Marland Kitchen lisinopril-hydrochlorothiazide (PRINZIDE,ZESTORETIC) 20-25 MG tablet TAKE 1 TABLET BY MOUTH DAILY.  Marland Kitchen loratadine (  CLARITIN) 10 MG tablet Take 1 tablet (10 mg total) by mouth daily.  . metoprolol (LOPRESSOR) 50 MG tablet Take 1 tablet (50 mg) in the AM and 1 tablet PM. (Patient taking differently: Take 50 mg by mouth 2 (two) times daily. Take 1 tablet (50 mg) in the AM and 1 tablet PM.)  . mupirocin ointment (BACTROBAN) 2 % APPLY TOPICALLY TO AFFECTED AREA TWICE DAILY  . polycarbophil (FIBERCON) 625 MG tablet Take 625 mg by mouth daily.  . potassium chloride (KLOR-CON) 20 MEQ packet TAKE (1) PACKET BY MOUTH DAILY AS DIRECTED  . Probiotic Product (PROBIOTIC-10 PO) Take by mouth.  Marland Kitchen alendronate (FOSAMAX) 70 MG tablet TAKE (1) TABLET BY MOUTH WEEKLY WITH A FULL GLASS OF WATER ON AN EMPTY STOMACH (Patient not taking: Reported on 10/11/2018)  . clindamycin (CLINDAGEL) 1 % gel APPLY TO AFFECTED AREA TWICE A DAY (Patient not taking: Reported on 10/11/2018)  . ibuprofen (ADVIL,MOTRIN) 800 MG tablet Take 1 tablet (800 mg total) by mouth every 8 (eight) hours as needed. (Patient not taking: Reported on 10/11/2018)  . methylPREDNISolone (MEDROL DOSEPAK) 4 MG TBPK tablet Take 6-5-4-3-2-1 po qd (Patient not taking: Reported on 10/11/2018)  . Nutritional Supplements (NUTRITIONAL SUPPLEMENT PO) Take by mouth. Acai berry/green tea, fish oil    No facility-administered encounter medications on file as of 10/11/2018.     Activities of Daily Living In your present state of health, do you have any difficulty performing the following activities: 10/11/2018  Hearing? N  Vision? N  Comment improvement since cataract surgery  Difficulty concentrating or making decisions? N  Walking or climbing stairs? N  Dressing or bathing? N  Doing errands, shopping? N  Preparing Food and eating ? N  Using the Toilet? N  In the past six  months, have you accidently leaked urine? N  Do you have problems with loss of bowel control? N  Managing your Medications? N  Managing your Finances? N  Housekeeping or managing your Housekeeping? N  Some recent data might be hidden   Patient Care Team: [name], MD as PCP - General (Family Medicine) Dr. Aggie Moats (Dentistry) Jacolyn Reedy, MD as Consulting Physician (Cardiology) Wonda Horner, MD (Gastroenterology) Newton Pigg, MD as Consulting Physician (Obstetrics and Gynecology) System, Provider Not In as Consulting Physician (Optometry) System, Provider Not In as Consulting Physician (Orthopedic Surgery) Melida Gimenez, [place] (Optometry)    Assessment:   This is a routine wellness examination for [place].  Exercise Activities and Dietary recommendations Current Exercise Habits: The patient does not participate in regular exercise at present  Goals    . Cut out extra servings    . Eat more fruits and vegetables- 3-5 a day    . Increase physical activity- start dancing 3 times a week       Fall Risk Fall Risk  10/11/2018 10/27/2017 06/14/2016 02/08/2016 01/21/2016  Falls in the past year? 0 Yes No No No  Number falls in past yr: - 1 - - -  Injury with Fall? - No - - -   Is the patient's home free of loose throw rugs in walkways, pet beds, electrical cords, etc?   yes      Grab bars in the bathroom? yes      Handrails on the stairs?   yes      Adequate lighting?   yes   Depression Screen PHQ 2/9 Scores 10/11/2018 02/13/2018 10/27/2017 10/27/2017  PHQ - 2 Score 0 1 6 1   PHQ-  9 Score - - 9 -     Cognitive Function MMSE - Mini Mental State Exam 10/11/2018 05/01/2013  Orientation to time 5 5  Orientation to Place 5 5  Registration 3 3  Attention/ Calculation 5 5  Recall 3 3  Language- name 2 objects 2 2  Language- repeat 1 1  Language- follow 3 step command 3 3  Language- read & follow direction 1 1  Write a sentence 1 1  Copy design 1 1  Total score 30 30       6CIT Screen 10/11/2018  What Year? 0 points  What month? 0 points  What time? 0 points  Count back from 20 0 points  Months in reverse 0 points  Repeat phrase 0 points  Total Score 0    Immunization History  Administered Date(s) Administered  . Influenza Whole 12/10/2009  . Influenza, High Dose Seasonal PF 10/05/2017, 08/29/2018  . Influenza-Unspecified 07/10/2015, 10/12/2017  . Pneumococcal Conjugate-13 07/10/2015  . Pneumococcal Polysaccharide-23 11/10/2017, 09/16/2018  . Td 01/31/2008    Screening Tests Health Maintenance  Topic Date Due  . TETANUS/TDAP  10/12/2019 (Originally 01/30/2018)  . INFLUENZA VACCINE  Completed  . DEXA SCAN  Completed  . PNA vac Low Risk Adult  Completed    Cancer Screenings: Lung: Low Dose CT Chest recommended if Age 48-80 years, 30 pack-year currently smoking OR have quit w/in 15years. Patient does not qualify. Breast:  Up to date on Mammogram? Yes   Up to date of Bone Density/Dexa? Yes Colorectal: up to date  Additional Screenings: : Hepatitis C Screening: complete     Plan:  All health maintenance up to date. Refill request sent to PCP under separate encounter.    I have personally reviewed and noted the following in the patient's chart:   . Medical and social history . Use of alcohol, tobacco or illicit drugs  . Current medications and supplements . Functional ability and status . Nutritional status . Physical activity . Advanced directives . List of other physicians . Hospitalizations, surgeries, and ER visits in previous 12 months . Vitals . Screenings to include cognitive, depression, and falls . Referrals and appointments  In addition, I have reviewed and discussed with patient certain preventive protocols, quality metrics, and best practice recommendations. A written personalized care plan for preventive services as well as general preventive health recommendations were provided to patient.     Esau Grew,  RN  10/11/2018

## 2018-10-11 NOTE — Progress Notes (Signed)
I agree with the documentation  [name] MD PGY-2 Family Medicine Resident

## 2018-10-11 NOTE — Patient Instructions (Addendum)
Kristen [name] , Thank you for taking time to come for your Medicare Wellness Visit. I appreciate your ongoing commitment to your health goals. Please review the following plan we discussed and let me know if I can assist you in the future.   These are the goals we discussed: Goals    . Cut out extra servings    . Eat more fruits and vegetables- 3-5 a day    . Increase physical activity- start dancing 3 times a week       This is a list of the screening recommended for you and due dates:  Health Maintenance  Topic Date Due  . Tetanus Vaccine  10/12/2019*  . Flu Shot  Completed  . DEXA scan (bone density measurement)  Completed  . Pneumonia vaccines  Completed  *Topic was postponed. The date shown is not the original due date.    Fall Prevention in the Home Falls can cause injuries. They can happen to people of all ages. There are many things you can do to make your home safe and to help prevent falls. What can I do on the outside of my home?  Regularly fix the edges of walkways and driveways and fix any cracks.  Remove anything that might make you trip as you walk through a door, such as a raised step or threshold.  Trim any bushes or trees on the path to your home.  Use bright outdoor lighting.  Clear any walking paths of anything that might make someone trip, such as rocks or tools.  Regularly check to see if handrails are loose or broken. Make sure that both sides of any steps have handrails.  Any raised decks and porches should have guardrails on the edges.  Have any leaves, snow, or ice cleared regularly.  Use sand or salt on walking paths during winter.  Clean up any spills in your garage right away. This includes oil or grease spills. What can I do in the bathroom?  Use night lights.  Install grab bars by the toilet and in the tub and shower. Do not use towel bars as grab bars.  Use non-skid mats or decals in the tub or shower.  If you need to sit down in the  shower, use a plastic, non-slip stool.  Keep the floor dry. Clean up any water that spills on the floor as soon as it happens.  Remove soap buildup in the tub or shower regularly.  Attach bath mats securely with double-sided non-slip rug tape.  Do not have throw rugs and other things on the floor that can make you trip. What can I do in the bedroom?  Use night lights.  Make sure that you have a light by your bed that is easy to reach.  Do not use any sheets or blankets that are too big for your bed. They should not hang down onto the floor.  Have a firm chair that has side arms. You can use this for support while you get dressed.  Do not have throw rugs and other things on the floor that can make you trip. What can I do in the kitchen?  Clean up any spills right away.  Avoid walking on wet floors.  Keep items that you use a lot in easy-to-reach places.  If you need to reach something above you, use a strong step stool that has a grab bar.  Keep electrical cords out of the way.  Do not use floor polish or  wax that makes floors slippery. If you must use wax, use non-skid floor wax.  Do not have throw rugs and other things on the floor that can make you trip. What can I do with my stairs?  Do not leave any items on the stairs.  Make sure that there are handrails on both sides of the stairs and use them. Fix handrails that are broken or loose. Make sure that handrails are as long as the stairways.  Check any carpeting to make sure that it is firmly attached to the stairs. Fix any carpet that is loose or worn.  Avoid having throw rugs at the top or bottom of the stairs. If you do have throw rugs, attach them to the floor with carpet tape.  Make sure that you have a light switch at the top of the stairs and the bottom of the stairs. If you do not have them, ask someone to add them for you. What else can I do to help prevent falls?  Wear shoes that: ? Do not have high  heels. ? Have rubber bottoms. ? Are comfortable and fit you well. ? Are closed at the toe. Do not wear sandals.  If you use a stepladder: ? Make sure that it is fully opened. Do not climb a closed stepladder. ? Make sure that both sides of the stepladder are locked into place. ? Ask someone to hold it for you, if possible.  Clearly mark and make sure that you can see: ? Any grab bars or handrails. ? First and last steps. ? Where the edge of each step is.  Use tools that help you move around (mobility aids) if they are needed. These include: ? Canes. ? Walkers. ? Scooters. ? Crutches.  Turn on the lights when you go into a dark area. Replace any light bulbs as soon as they burn out.  Set up your furniture so you have a clear path. Avoid moving your furniture around.  If any of your floors are uneven, fix them.  If there are any pets around you, be aware of where they are.  Review your medicines with your doctor. Some medicines can make you feel dizzy. This can increase your chance of falling. Ask your doctor what other things that you can do to help prevent falls. This information is not intended to replace advice given to you by your health care provider. Make sure you discuss any questions you have with your health care provider. Document Released: 08/20/2009 Document Revised: 03/31/2016 Document Reviewed: 11/28/2014 Elsevier Interactive Patient Education  [employer].

## 2018-10-19 ENCOUNTER — Other Ambulatory Visit (INDEPENDENT_AMBULATORY_CARE_PROVIDER_SITE_OTHER): Payer: Self-pay | Admitting: Orthopedic Surgery

## 2018-10-26 ENCOUNTER — Telehealth (INDEPENDENT_AMBULATORY_CARE_PROVIDER_SITE_OTHER): Payer: Self-pay | Admitting: Orthopedic Surgery

## 2018-10-26 NOTE — Telephone Encounter (Signed)
M[facility] Fill  [facility]  [phone]    Medication refill  Allopurinol

## 2018-10-29 ENCOUNTER — Telehealth: Payer: Self-pay | Admitting: Cardiology

## 2018-10-29 NOTE — Telephone Encounter (Signed)
Called patient left VM to schedule appointment

## 2018-10-29 NOTE — Telephone Encounter (Signed)
Can you please call pt and make an appt for eval. She was to follow up with lab work for gout several months ago and can not refill medication without appt first please.

## 2018-10-29 NOTE — Telephone Encounter (Signed)
Lam for patient to call back to schedule surgery pre-op

## 2018-11-02 ENCOUNTER — Telehealth: Payer: Self-pay | Admitting: Cardiology

## 2018-11-02 NOTE — Telephone Encounter (Signed)
Patient has been called multiple times with no response.

## 2018-11-05 ENCOUNTER — Telehealth: Payer: Self-pay | Admitting: Cardiology

## 2018-11-05 NOTE — Telephone Encounter (Signed)
LAM for patient to call back to schedule appt for pre-op clearance

## 2018-11-08 ENCOUNTER — Other Ambulatory Visit (INDEPENDENT_AMBULATORY_CARE_PROVIDER_SITE_OTHER): Payer: Self-pay | Admitting: Orthopedic Surgery

## 2018-11-08 DIAGNOSIS — M170 Bilateral primary osteoarthritis of knee: Secondary | ICD-10-CM | POA: Diagnosis not present

## 2018-11-14 ENCOUNTER — Other Ambulatory Visit (INDEPENDENT_AMBULATORY_CARE_PROVIDER_SITE_OTHER): Payer: Self-pay | Admitting: Orthopedic Surgery

## 2018-11-19 ENCOUNTER — Other Ambulatory Visit (INDEPENDENT_AMBULATORY_CARE_PROVIDER_SITE_OTHER): Payer: Self-pay | Admitting: Orthopedic Surgery

## 2018-12-17 ENCOUNTER — Telehealth: Payer: Self-pay

## 2018-12-17 ENCOUNTER — Telehealth: Payer: Self-pay | Admitting: Family Medicine

## 2018-12-17 NOTE — Telephone Encounter (Signed)
Left message on patients home phone and cell phone to return call regarding Pradaxa. We received letter from [facility] stating that Pradaxa is not covered, and she would need to try Xarelto or Eliquis.  Will check with patient to see if she has enough Pradaxa to get her to the appointment with Dr Agustin Cree on 01-15-2019.

## 2018-12-17 NOTE — Telephone Encounter (Signed)
Clyde Canterbury from [place] [facility] check out the foot of the pt. She said that that left foot is 1.10  and the right foot is 0.87. This is due to poor foot ciruculation.   Please call Clyde Canterbury @ [phone] ad

## 2018-12-18 NOTE — Telephone Encounter (Signed)
Patient returned call to office with questions regarding Pradaxa not being covered by her insurance.  Patient advised that our office could complete prior auth with [facility] to see if we could get it approved.   Patient does have enough Pradaxa to last her until appointment with Dr Agustin Cree on 01-15-2019.  Patient was very concerned with changing medications as she informed me that she has been taking Pradaxa for about 5 years now.  She states she did not receive this letter in the mail to her knowledge, but she has also had issues with her mail box lid blowing open.    Patient advised that we will contact her once we hear something from [facility].  Patient agreed to plan and verbalized understanding.

## 2018-12-21 NOTE — Telephone Encounter (Signed)
Attempted to call amelia but received VM. Will discuss with patient at next visit.  [name] MD PGY-2 Family Medicine Resident

## 2018-12-24 ENCOUNTER — Other Ambulatory Visit (INDEPENDENT_AMBULATORY_CARE_PROVIDER_SITE_OTHER): Payer: Self-pay | Admitting: Orthopedic Surgery

## 2018-12-26 NOTE — Telephone Encounter (Signed)
Left message for patient to return call regarding letter of denial for Pradaxa.

## 2019-01-10 ENCOUNTER — Other Ambulatory Visit: Payer: Self-pay | Admitting: Family Medicine

## 2019-01-10 DIAGNOSIS — F411 Generalized anxiety disorder: Secondary | ICD-10-CM

## 2019-01-15 ENCOUNTER — Ambulatory Visit (INDEPENDENT_AMBULATORY_CARE_PROVIDER_SITE_OTHER): Payer: Medicare Other | Admitting: Cardiology

## 2019-01-15 ENCOUNTER — Encounter: Payer: Self-pay | Admitting: Cardiology

## 2019-01-15 VITALS — BP 108/70 | HR 72 | Ht 62.5 in | Wt 194.8 lb

## 2019-01-15 DIAGNOSIS — I5032 Chronic diastolic (congestive) heart failure: Secondary | ICD-10-CM | POA: Diagnosis not present

## 2019-01-15 DIAGNOSIS — I4821 Permanent atrial fibrillation: Secondary | ICD-10-CM | POA: Diagnosis not present

## 2019-01-15 DIAGNOSIS — E669 Obesity, unspecified: Secondary | ICD-10-CM

## 2019-01-15 NOTE — Progress Notes (Signed)
Cardiology Office Note:    Date:  01/15/2019   ID:  Kristen [name], DOB [birth year], MRN [id number]  PCP:  [name], MD  Cardiologist:  Jenne Campus, MD    Referring MD: [name], MD   Chief Complaint  Patient presents with  . Follow-up  Doing well but upset about the fact she cannot have surgery on her knee  History of Present Illness:    Kristen [name] is a 78 y.o. female permanent atrial fibrillation no chest pain tightness squeezing pressure been chest no palpitations she is upset because she would like to have knee surgery was told she cannot have it because of her weight she need to lose some weight before surgery she is trying to do some diet lost 3 pounds and disappointed about it no chest pain tightness squeezing pressure been chest no palpitations  Past Medical History:  Diagnosis Date  . Anxiety   . Atrial fibrillation ([place])   . Cardiac dysrhythmia, unspecified   . Cataract   . Depression   . Diverticula of colon   . Diverticulosis   . Essential hypertension, benign   . Fatigue   . Fibrocystic breast disease 1980s   General Surgery aspirates  . Gout   . History of diverticulitis 01/22/2016  . Irregular heart beat 2012  . Lumbar back pain   . Need for prophylactic vaccination and inoculation against influenza   . Osteopenia   . Scoliosis   . Urinary tract infection     Past Surgical History:  Procedure Laterality Date  . BREAST CYST ASPIRATION    . BREAST EXCISIONAL BIOPSY    . BREAST SURGERY  1980   cyst removed  . BUNIONECTOMY  2003   Dr Sharol Given  . cataract    . Eyelid surgery  2006  . KNEE CARTILAGE SURGERY  2002   Dr Para March  . skin growth    . TUBAL LIGATION  1980    Current Medications: Current Meds  Medication Sig  . alendronate (FOSAMAX) 10 MG tablet TAKE (1) TABLET BY MOUTH PER WEEK TAKE ON EMPTY STOMACH WITH FULL GLASS OF WATER  . alendronate (FOSAMAX) 70 MG tablet TAKE (1) TABLET BY MOUTH WEEKLY WITH A FULL GLASS OF  WATER ON AN EMPTY STOMACH  . allopurinol (ZYLOPRIM) 100 MG tablet Take 1 tablet (100 mg total) by mouth daily.  . Ascorbic Acid (VITAMIN C) 100 MG tablet Take 100 mg by mouth daily.    Marland Kitchen b complex vitamins tablet Take 1 tablet by mouth daily.    . Biotin 1000 MCG tablet Take 1,000 mcg by mouth 3 (three) times daily.  . calcium citrate-vitamin D (CITRACAL+D) 315-200 MG-UNIT per tablet Take 1 tablet by mouth daily.    . clindamycin (CLINDAGEL) 1 % gel APPLY TO AFFECTED AREA TWICE A DAY  . colchicine 0.6 MG tablet Take 1 tablet (0.6 mg total) by mouth 2 (two) times daily. Take twice daily until pain resolves and then take as needed for gout flare.  . colchicine 0.6 MG tablet TAKE ONE (1) TABLET BY MOUTH TWICE DAILY UNTIL PAIN RESOLVES AND THEN TAKE AS NEEDED FOR GOUT FLARE  . [facility] ANTI-FUNGAL 2 % powder APPLY TO AFFECTED AREA TWICE A DAY AFTER SKIN RESOLVES FROM KETOCONAZOLE CREAM (Patient taking differently: APPLY TO AFFECTED AREA TWICE A DAY AFTER SKIN RESOLVES FROM KETOCONAZOLE CREAM as needed)  . Dabigatran Etexilate Mesylate (PRADAXA) 150 MG CAPS Take 150 mg by mouth every 12 (twelve) hours.    Marland Kitchen  diazepam (VALIUM) 5 MG tablet TAKE 1/2 TABLET (2.5 MG TOTAL) BY MOUTH AT BEDTIME.  Marland Kitchen doxycycline (VIBRAMYCIN) 50 MG capsule TAKE 2 CAPSULES BY MOUTH 2 (TWO) TIMES DAILY.  . fish oil-omega-3 fatty acids 1000 MG capsule Take 2 g by mouth daily.  . furosemide (LASIX) 20 MG tablet Take 1 tablet (20 mg total) by mouth daily.  Marland Kitchen glucosamine-chondroitin (GLUCOSAMINE-CHONDROITIN DS) 500-400 MG tablet Take 1 tablet by mouth.  Marland Kitchen ibuprofen (ADVIL,MOTRIN) 800 MG tablet Take 1 tablet (800 mg total) by mouth every 8 (eight) hours as needed.  Marland Kitchen lisinopril-hydrochlorothiazide (PRINZIDE,ZESTORETIC) 20-25 MG tablet TAKE 1 TABLET BY MOUTH DAILY.  Marland Kitchen loratadine (CLARITIN) 10 MG tablet Take 1 tablet (10 mg total) by mouth daily.  . methylPREDNISolone (MEDROL DOSEPAK) 4 MG TBPK tablet Take 6-5-4-3-2-1 po qd  . metoprolol  (LOPRESSOR) 50 MG tablet Take 1 tablet (50 mg) in the AM and 1 tablet PM. (Patient taking differently: Take 50 mg by mouth 2 (two) times daily. Take 1 tablet (50 mg) in the AM and 1 tablet PM.)  . mupirocin ointment (BACTROBAN) 2 % APPLY TOPICALLY TO AFFECTED AREA TWICE DAILY  . Nutritional Supplements (NUTRITIONAL SUPPLEMENT PO) Take by mouth. Acai berry/green tea, fish oil   . polycarbophil (FIBERCON) 625 MG tablet Take 625 mg by mouth daily.  . potassium chloride (KLOR-CON) 20 MEQ packet TAKE (1) PACKET BY MOUTH DAILY AS DIRECTED  . Probiotic Product (PROBIOTIC-10 PO) Take by mouth.     Allergies:   Flecainide and Flecainide acetate   Social History   Socioeconomic History  . Marital status: Widowed    Spouse name: [name]  . Number of children: 1  . Years of education: Masters  . Highest education level: Master's degree (e.g., MA, MS, MEng, MEd, MSW, MBA)  Occupational History  . Occupation: Retired- Product manager: RETIRED  Social Needs  . Financial resource strain: Not hard at all  . Food insecurity:    Worry: Never true    Inability: Never true  . Transportation needs:    Medical: No    Non-medical: No  Tobacco Use  . Smoking status: Never Smoker  . Smokeless tobacco: Never Used  Substance and Sexual Activity  . Alcohol use: Yes    Comment: rarely  . Drug use: No  . Sexual activity: Not Currently  Lifestyle  . Physical activity:    Days per week: 0 days    Minutes per session: 0 min  . Stress: Not at all  Relationships  . Social connections:    Talks on phone: Three times a week    Gets together: Never    Attends religious service: Never    Active member of club or organization: Yes    Attends meetings of clubs or organizations: 1 to 4 times per year    Relationship status: Widowed  Other Topics Concern  . Not on file  Social History Narrative   Emergency Contact: Leta Baptist [phone]   Who lives with you: self   Cats as pets and takes care of  feral cats also      Diet: Pt has a varied diet of protein, starch, and vegetables. Exercise: Pt dances regularly for shows   Seatbelts: Pt reports wearing seatbelt when in vehicles.    Sun Exposure/Protection: Pt reports not being in the sun very much   Hobbies: dancing, painting,       Working smoke alarm: yes      Home throw rugs: yes  Home free from clutter: yes                 Family History: The patient's family history includes Alcohol abuse in her father; Cancer in her maternal aunt and mother; Heart disease in her maternal uncle; Kidney disease in her father. ROS:   Please see the history of present illness.    All 14 point review of systems negative except as described per history of present illness  EKGs/Labs/Other Studies Reviewed:      Recent Labs: No results found for requested labs within last 8760 hours.  Recent Lipid Panel    Component Value Date/Time   CHOL 224 (H) 06/09/2014 1427   TRIG 142 06/09/2014 1427   HDL 64 06/09/2014 1427   CHOLHDL 3.5 06/09/2014 1427   VLDL 28 06/09/2014 1427   LDLCALC 132 (H) 06/09/2014 1427    Physical Exam:    VS:  BP 108/70   Pulse 72   Ht 5' 2.5" (1.588 m)   Wt 194 lb 12.8 oz (88.4 kg)   SpO2 97%   BMI 35.06 kg/m     Wt Readings from Last 3 Encounters:  01/15/19 194 lb 12.8 oz (88.4 kg)  10/11/18 198 lb 6.4 oz (90 kg)  07/03/18 197 lb (89.4 kg)     GEN:  Well nourished, well developed in no acute distress HEENT: Normal NECK: No JVD; No carotid bruits LYMPHATICS: No lymphadenopathy CARDIAC: Irregularly irregular, no murmurs, no rubs, no gallops RESPIRATORY:  Clear to auscultation without rales, wheezing or rhonchi  ABDOMEN: Soft, non-tender, non-distended MUSCULOSKELETAL:  No edema; No deformity  SKIN: Warm and dry LOWER EXTREMITIES: no swelling NEUROLOGIC:  Alert and oriented x 3 PSYCHIATRIC:  Normal affect   ASSESSMENT:    1. Chronic diastolic heart failure ([place])   2. Permanent atrial  fibrillation   3. Obesity (BMI 30-39.9)    PLAN:    In order of problems listed above:  1. Chronic diastolic congestive heart failure stable compensated continue present management 2. Permanent atrial fibrillation complain of having some palpitations at the evening time when she goes to bed she use Valium which typically helps I will switch her to long-acting form of metoprolol hoping that that will give her more equal even distribution of medication for.  24 hours and more smoother control of her heart rate.  She accepted 3. Obesity was spoken at length about this I gave her some hints how to control her weight and lose it.   Medication Adjustments/Labs and Tests Ordered: Current medicines are reviewed at length with the patient today.  Concerns regarding medicines are outlined above.  No orders of the defined types were placed in this encounter.  Medication changes: No orders of the defined types were placed in this encounter.   Signed, Park Liter, MD, [facility] 01/15/2019 4:45 PM    [place]

## 2019-01-15 NOTE — Patient Instructions (Signed)
Medication Instructions:  Your physician recommends that you continue on your current medications as directed. Please refer to the Current Medication list given to you today.  If you need a refill on your cardiac medications before your next appointment, please call your pharmacy.   Lab work: None.  If you have labs (blood work) drawn today and your tests are completely normal, you will receive your results only by: . MyChart Message (if you have MyChart) OR . A paper copy in the mail If you have any lab test that is abnormal or we need to change your treatment, we will call you to review the results.  Testing/Procedures: None.    Follow-Up: At [facility], you and your health needs are our priority.  As part of our continuing mission to provide you with exceptional heart care, we have created designated Provider Care Teams.  These Care Teams include your primary Cardiologist (physician) and Advanced Practice Providers (APPs -  Physician Assistants and Nurse Practitioners) who all work together to provide you with the care you need, when you need it. You will need a follow up appointment in 6 months.  Please call our office 2 months in advance to schedule this appointment.  You may see No primary care provider on file. or another member of our [facility] Provider Team in [place]: Brian Munley, MD . Rajan Revankar, MD  Any Other Special Instructions Will Be Listed Below (If Applicable).     

## 2019-01-16 ENCOUNTER — Telehealth: Payer: Self-pay | Admitting: Emergency Medicine

## 2019-01-16 NOTE — Telephone Encounter (Signed)
Left message for patient to return call regarding medications and yesterday's visit.

## 2019-01-23 ENCOUNTER — Other Ambulatory Visit (INDEPENDENT_AMBULATORY_CARE_PROVIDER_SITE_OTHER): Payer: Self-pay | Admitting: Orthopedic Surgery

## 2019-01-29 MED ORDER — APIXABAN 5 MG PO TABS: 5.0000 mg | ORAL_TABLET | Freq: Two times a day (BID) | ORAL | 1 refills | Status: DC

## 2019-01-29 MED ORDER — METOPROLOL SUCCINATE ER 100 MG PO TB24: 100.0000 mg | ORAL_TABLET | Freq: Every day | ORAL | 1 refills | Status: DC

## 2019-01-29 NOTE — Addendum Note (Signed)
Addended by: Ashok Norris on: 01/29/2019 11:32 AM   Modules accepted: Orders

## 2019-01-29 NOTE — Telephone Encounter (Signed)
Patient advised per Dr. Agustin Cree to stop pradaxa and stop metoprolol tartrate. She will start on eliquis 5 mg twice daily and metoprolol succinate 100 mg daily. Patient verbally understands.

## 2019-01-29 NOTE — Telephone Encounter (Signed)
It is ok to switch to eliquis 5 mg po bid from pradaxa

## 2019-01-29 NOTE — Telephone Encounter (Signed)
Patient calling in asking about taking long acting metoprolol and wants to confirm if she needs to switch from pradaxa to eliquis as her insurance isn't approving pradaxa at this time. She said this was discussed with Dr. Arn Medal however we never were able to reach the patient to make the changes. Will confirm with Dr. Agustin Cree and follow up with patient.

## 2019-01-30 ENCOUNTER — Telehealth: Payer: Self-pay | Admitting: Cardiology

## 2019-01-30 MED ORDER — FUROSEMIDE 20 MG PO TABS: 20.0000 mg | ORAL_TABLET | Freq: Every day | ORAL | 0 refills | Status: DC

## 2019-01-30 NOTE — Telephone Encounter (Signed)
°*  STAT* If patient is at the pharmacy, call can be transferred to refill team.   1. Which medications need to be refilled? Lasix 20mg - 2. Which pharmacy/location (including street and city if local pharmacy) is medication to be sent to? [facility] [phone]  3. Do they need a 30 day or 90 day supply? [place]

## 2019-02-17 ENCOUNTER — Other Ambulatory Visit (INDEPENDENT_AMBULATORY_CARE_PROVIDER_SITE_OTHER): Payer: Self-pay | Admitting: Orthopedic Surgery

## 2019-02-20 ENCOUNTER — Other Ambulatory Visit: Payer: Self-pay | Admitting: Family Medicine

## 2019-02-20 ENCOUNTER — Other Ambulatory Visit (INDEPENDENT_AMBULATORY_CARE_PROVIDER_SITE_OTHER): Payer: Self-pay | Admitting: Orthopedic Surgery

## 2019-02-20 DIAGNOSIS — L989 Disorder of the skin and subcutaneous tissue, unspecified: Secondary | ICD-10-CM

## 2019-02-20 DIAGNOSIS — I5032 Chronic diastolic (congestive) heart failure: Secondary | ICD-10-CM

## 2019-02-27 ENCOUNTER — Telehealth: Payer: Self-pay

## 2019-02-27 MED ORDER — LISINOPRIL-HYDROCHLOROTHIAZIDE 20-25 MG PO TABS: 1.0000 | ORAL_TABLET | Freq: Every day | ORAL | 1 refills | Status: DC

## 2019-02-27 NOTE — Telephone Encounter (Signed)
Lisinopril refill sent to [facility] per pt preference

## 2019-03-06 DIAGNOSIS — Z87440 Personal history of urinary (tract) infections: Secondary | ICD-10-CM | POA: Diagnosis not present

## 2019-03-07 ENCOUNTER — Other Ambulatory Visit: Payer: Self-pay

## 2019-03-08 MED ORDER — LISINOPRIL-HYDROCHLOROTHIAZIDE 20-25 MG PO TABS: 1.0000 | ORAL_TABLET | Freq: Every day | ORAL | 1 refills | Status: DC

## 2019-03-22 ENCOUNTER — Other Ambulatory Visit (INDEPENDENT_AMBULATORY_CARE_PROVIDER_SITE_OTHER): Payer: Self-pay | Admitting: Orthopedic Surgery

## 2019-03-22 NOTE — Telephone Encounter (Signed)
Please advise Dr Sharol Given, patient was called to get scheduled for another appt for Uric acid levels (per last notes dated in  06/2018), her levels were 8.9 and she states that she doesn't feel its necessary to come in at this time. Would you like for me to refill this medication? She is taking Allopurinol and Colchicine and states it helps her with flare-ups. Thank you

## 2019-03-25 ENCOUNTER — Other Ambulatory Visit (INDEPENDENT_AMBULATORY_CARE_PROVIDER_SITE_OTHER): Payer: Self-pay | Admitting: Orthopedic Surgery

## 2019-04-24 ENCOUNTER — Other Ambulatory Visit: Payer: Self-pay | Admitting: Family Medicine

## 2019-04-24 DIAGNOSIS — Z1231 Encounter for screening mammogram for malignant neoplasm of breast: Secondary | ICD-10-CM | POA: Diagnosis not present

## 2019-04-24 DIAGNOSIS — Z124 Encounter for screening for malignant neoplasm of cervix: Secondary | ICD-10-CM | POA: Diagnosis not present

## 2019-04-24 DIAGNOSIS — R87610 Atypical squamous cells of undetermined significance on cytologic smear of cervix (ASC-US): Secondary | ICD-10-CM | POA: Diagnosis not present

## 2019-04-24 DIAGNOSIS — N840 Polyp of corpus uteri: Secondary | ICD-10-CM | POA: Diagnosis not present

## 2019-04-24 DIAGNOSIS — F411 Generalized anxiety disorder: Secondary | ICD-10-CM

## 2019-04-24 DIAGNOSIS — R87613 High grade squamous intraepithelial lesion on cytologic smear of cervix (HGSIL): Secondary | ICD-10-CM | POA: Diagnosis not present

## 2019-04-25 DIAGNOSIS — K5790 Diverticulosis of intestine, part unspecified, without perforation or abscess without bleeding: Secondary | ICD-10-CM | POA: Diagnosis not present

## 2019-04-25 DIAGNOSIS — R109 Unspecified abdominal pain: Secondary | ICD-10-CM | POA: Diagnosis not present

## 2019-04-25 DIAGNOSIS — L290 Pruritus ani: Secondary | ICD-10-CM | POA: Diagnosis not present

## 2019-04-29 DIAGNOSIS — K5790 Diverticulosis of intestine, part unspecified, without perforation or abscess without bleeding: Secondary | ICD-10-CM | POA: Diagnosis not present

## 2019-05-01 ENCOUNTER — Other Ambulatory Visit: Payer: Self-pay | Admitting: Gastroenterology

## 2019-05-01 ENCOUNTER — Other Ambulatory Visit: Payer: Self-pay

## 2019-05-01 ENCOUNTER — Telehealth: Payer: Self-pay | Admitting: Emergency Medicine

## 2019-05-01 DIAGNOSIS — R109 Unspecified abdominal pain: Secondary | ICD-10-CM

## 2019-05-01 NOTE — Telephone Encounter (Signed)
[facility] call for verbal order for lasix 20 mg daily refill. This was provide. No further questions.

## 2019-05-02 MED ORDER — ALENDRONATE SODIUM 10 MG PO TABS: ORAL_TABLET | ORAL | 10 refills | Status: DC

## 2019-05-08 DIAGNOSIS — R87619 Unspecified abnormal cytological findings in specimens from cervix uteri: Secondary | ICD-10-CM | POA: Diagnosis not present

## 2019-05-08 DIAGNOSIS — N888 Other specified noninflammatory disorders of cervix uteri: Secondary | ICD-10-CM | POA: Diagnosis not present

## 2019-05-16 ENCOUNTER — Ambulatory Visit
Admission: RE | Admit: 2019-05-16 | Discharge: 2019-05-16 | Disposition: A | Discharge status: Home / Self Care | Payer: Medicare Other | Source: Ambulatory Visit | Attending: Gastroenterology | Admitting: Gastroenterology

## 2019-05-16 ENCOUNTER — Other Ambulatory Visit: Payer: Self-pay

## 2019-05-16 DIAGNOSIS — K8020 Calculus of gallbladder without cholecystitis without obstruction: Secondary | ICD-10-CM | POA: Diagnosis not present

## 2019-05-16 DIAGNOSIS — R109 Unspecified abdominal pain: Secondary | ICD-10-CM

## 2019-05-16 MED ORDER — LORATADINE 10 MG PO TABS: 10.0000 mg | ORAL_TABLET | Freq: Every day | ORAL | 1 refills | Status: DC

## 2019-05-17 DIAGNOSIS — K8020 Calculus of gallbladder without cholecystitis without obstruction: Secondary | ICD-10-CM | POA: Diagnosis not present

## 2019-05-17 DIAGNOSIS — K5790 Diverticulosis of intestine, part unspecified, without perforation or abscess without bleeding: Secondary | ICD-10-CM | POA: Diagnosis not present

## 2019-05-17 DIAGNOSIS — K760 Fatty (change of) liver, not elsewhere classified: Secondary | ICD-10-CM | POA: Diagnosis not present

## 2019-05-21 ENCOUNTER — Other Ambulatory Visit: Payer: Self-pay

## 2019-05-21 ENCOUNTER — Encounter: Payer: Self-pay | Admitting: Family

## 2019-05-21 ENCOUNTER — Ambulatory Visit (INDEPENDENT_AMBULATORY_CARE_PROVIDER_SITE_OTHER): Payer: Medicare Other | Admitting: Family

## 2019-05-21 DIAGNOSIS — M21611 Bunion of right foot: Secondary | ICD-10-CM

## 2019-05-21 DIAGNOSIS — I83891 Varicose veins of right lower extremities with other complications: Secondary | ICD-10-CM | POA: Diagnosis not present

## 2019-05-21 DIAGNOSIS — I872 Venous insufficiency (chronic) (peripheral): Secondary | ICD-10-CM

## 2019-05-21 DIAGNOSIS — M25471 Effusion, right ankle: Secondary | ICD-10-CM | POA: Diagnosis not present

## 2019-05-21 NOTE — Progress Notes (Signed)
Office Visit Note   Patient: Kristen [name]           Date of Birth: [birth year]           MRN: [id number] Visit Date: 05/21/2019              Requested by: [name], MD [phone] [address],   [id number] PCP: [name], MD  Chief Complaint  Patient presents with  . Right Foot - Pain      HPI: The patient is a 78 year old woman who presents today complaining of swelling to her right ankle as well as generalized swelling to her right lower extremity.  She is status post bunionectomy of the right great toe she complains that over the last 2 days her great toe has crossed over again.  She is concerned about discoloration over the dorsum of her foot and ankle has noticed red-purple even blue spots that time.  She does have a history of varicose veins and has had procedures with the vein doctors on [address]. She complains that she worries about having a clot in her right lower extremity she has not had any pain in her calf no swelling no lumps or bumps that she can palpate no cramping.  No recent injury  Assessment & Plan: Visit Diagnoses:  1. Chronic venous insufficiency   2. Varicose veins of leg with edema, right   3. Ankle swelling, right   4. Bunion of great toe of right foot     Plan: Reassurance provided.  We will refer her to vascular surgeon at her request for evaluation of blood flow.  She will continue with compression garments.  Continue with conservative measures for her bunion.  Did provide some toe spacers she will continue these with supportive shoe wear.  Follow-up in the office for great toe issues on an as-needed basis.  Follow-Up Instructions: No follow-ups on file.   Right Ankle Exam  Right ankle exam is normal.  Tenderness  The patient is experiencing no tenderness. Swelling: mild  Range of Motion  The patient has normal right ankle ROM.  Muscle Strength  The patient has normal right ankle strength.  Other  Erythema:  absent Pulse: present       Patient is alert, oriented, no adenopathy, well-dressed, normal affect, normal respiratory effort. Biphasic pulse doppler.  Imaging: No results found. No images are attached to the encounter.  Labs: Lab Results  Component Value Date   HGBA1C 5.3 02/15/2016   HGBA1C 5.6 06/09/2014   HGBA1C 5.6 10/10/2013   LABURIC 8.9 (H) 06/05/2018   REPTSTATUS 12/17/2010 FINAL 12/15/2010   GRAMSTAIN  12/15/2010    FEW WBC PRESENT,BOTH PMN AND MONONUCLEAR NO SQUAMOUS EPITHELIAL CELLS SEEN NO ORGANISMS SEEN   CULT NO GROWTH 2 DAYS 12/15/2010     Lab Results  Component Value Date   ALBUMIN 4.1 03/25/2013   ALBUMIN 3.9 04/13/2012   ALBUMIN 4.4 01/31/2008   LABURIC 8.9 (H) 06/05/2018    Lab Results  Component Value Date   MG 2.0 12/15/2010   MG 2.4 09/03/2010   Lab Results  Component Value Date   VD25OH 51 04/13/2012    No results found for: PREALBUMIN CBC EXTENDED Latest Ref Rng & Units 02/23/2016 03/25/2013 04/13/2012  WBC 4.0 - 10.5 K/uL - 9.2 7.2  RBC 3.87 - 5.11 MIL/uL - 4.09 4.22  HGB 12.0 - 15.0 g/dL 15.0 12.1 12.6  HCT 36.0 - 46.0 % 44.0 35.4(L) 37.8  PLT 150 - 400 K/uL - 268 292  NEUTROABS 1.7 - 7.7 K/uL - 5.5 -  LYMPHSABS 0.7 - 4.0 K/uL - 2.7 -     There is no height or weight on file to calculate BMI.  Orders:  No orders of the defined types were placed in this encounter.  No orders of the defined types were placed in this encounter.    Procedures: No procedures performed  Clinical Data: No additional findings.  ROS:  All other systems negative, except as noted in the HPI. Review of Systems  Constitutional: Negative for chills and fever.  Cardiovascular: Positive for leg swelling.  Skin: Negative for color change and wound.  Neurological: Negative for weakness and numbness.    Objective: Vital Signs: There were no vitals taken for this visit.  Specialty Comments:  No specialty comments available.  PMFS History:  Patient Active Problem List   Diagnosis Date Noted  . Varicose veins of leg with edema, right 05/21/2019  . Ankle swelling, right 05/21/2019  . Bunion of great toe of right foot 05/21/2019  . Osteoarthritis 03/13/2018  . Gait abnormality 03/13/2018  . Skin lesion 02/13/2018  . 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 Abnormal Pap smear of cervix 02/08/2016  . Chronic kidney disease 02/08/2016  . Urinary frequency 02/08/2016  . History of diverticulitis 01/22/2016  . Anxiety state 10/17/2014  . Long-term (current) use of anticoagulants 06/10/2014  . Chronic venous insufficiency 06/10/2014  . Obesity (BMI 30-39.9)   . Chronic diastolic heart failure ([place])   . [place] intertrigo 06/09/2014  . Nocturnal leg cramps 04/13/2012  . Insomnia 01/10/2011  . Atrial fibrillation ([place])   . OSTEOPENIA 01/31/2008  . Hypertensive heart disease    Past Medical History:  Diagnosis Date  . Anxiety   . Atrial fibrillation ([place])   . Cardiac dysrhythmia, unspecified   . Cataract   . Depression   . Diverticula of colon   . Diverticulosis   . Essential hypertension, benign   . Fatigue   . Fibrocystic breast disease 1980s   General Surgery aspirates  . Gout   . History of diverticulitis 01/22/2016  . Irregular heart beat 2012  . Lumbar back pain   . Need for prophylactic vaccination and inoculation against influenza   . Osteopenia   . Scoliosis   . Urinary tract infection     Family History  Problem Relation Age of Onset  . Cancer Mother        ovarian  . Kidney disease Father   . Alcohol abuse Father   . Cancer Maternal Aunt        lung- smoker  . Heart disease Maternal Uncle     Past Surgical History:  Procedure Laterality Date  . BREAST CYST ASPIRATION    . BREAST EXCISIONAL BIOPSY    . BREAST SURGERY  1980   cyst removed  . BUNIONECTOMY  2003   Dr Sharol Given  . cataract    . Eyelid surgery  2006  . KNEE CARTILAGE SURGERY  2002   Dr Para March  . skin  growth    . TUBAL LIGATION  1980   Social History   Occupational History  . Occupation: Retired- Product manager: RETIRED  Tobacco Use  . Smoking status: Never Smoker  . Smokeless tobacco: Never Used  Substance and Sexual Activity  . Alcohol use: Yes    Comment: rarely  . Drug use: No  . Sexual activity: Not Currently

## 2019-05-23 ENCOUNTER — Other Ambulatory Visit: Payer: Self-pay | Admitting: Cardiology

## 2019-05-29 DIAGNOSIS — I8311 Varicose veins of right lower extremity with inflammation: Secondary | ICD-10-CM | POA: Diagnosis not present

## 2019-05-29 DIAGNOSIS — M79604 Pain in right leg: Secondary | ICD-10-CM | POA: Diagnosis not present

## 2019-05-29 DIAGNOSIS — R600 Localized edema: Secondary | ICD-10-CM | POA: Diagnosis not present

## 2019-06-24 ENCOUNTER — Other Ambulatory Visit (INDEPENDENT_AMBULATORY_CARE_PROVIDER_SITE_OTHER): Payer: Self-pay | Admitting: Orthopedic Surgery

## 2019-07-04 ENCOUNTER — Other Ambulatory Visit: Payer: Self-pay | Admitting: Family Medicine

## 2019-07-04 DIAGNOSIS — F411 Generalized anxiety disorder: Secondary | ICD-10-CM

## 2019-07-08 MED ORDER — LORATADINE 10 MG PO TABS: 10.0000 mg | ORAL_TABLET | Freq: Every day | ORAL | 1 refills | Status: DC

## 2019-07-17 DIAGNOSIS — M79604 Pain in right leg: Secondary | ICD-10-CM | POA: Diagnosis not present

## 2019-07-17 DIAGNOSIS — I8311 Varicose veins of right lower extremity with inflammation: Secondary | ICD-10-CM | POA: Diagnosis not present

## 2019-07-22 ENCOUNTER — Ambulatory Visit: Payer: Medicare Other | Admitting: Cardiology

## 2019-07-23 ENCOUNTER — Encounter: Payer: Self-pay | Admitting: Cardiology

## 2019-07-23 ENCOUNTER — Other Ambulatory Visit: Payer: Self-pay

## 2019-07-23 ENCOUNTER — Ambulatory Visit (INDEPENDENT_AMBULATORY_CARE_PROVIDER_SITE_OTHER): Payer: Medicare Other | Admitting: Cardiology

## 2019-07-23 VITALS — BP 110/60 | HR 70 | Ht 62.5 in | Wt 192.2 lb

## 2019-07-23 DIAGNOSIS — I5032 Chronic diastolic (congestive) heart failure: Secondary | ICD-10-CM

## 2019-07-23 DIAGNOSIS — I4821 Permanent atrial fibrillation: Secondary | ICD-10-CM | POA: Diagnosis not present

## 2019-07-23 DIAGNOSIS — M25471 Effusion, right ankle: Secondary | ICD-10-CM | POA: Diagnosis not present

## 2019-07-23 DIAGNOSIS — I87321 Chronic venous hypertension (idiopathic) with inflammation of right lower extremity: Secondary | ICD-10-CM | POA: Diagnosis not present

## 2019-07-23 DIAGNOSIS — I83891 Varicose veins of right lower extremities with other complications: Secondary | ICD-10-CM

## 2019-07-23 NOTE — Patient Instructions (Signed)

## 2019-07-23 NOTE — Progress Notes (Signed)
Cardiology Office Note:    Date:  07/23/2019   ID:  Kristen [name], DOB [birth year], MRN [id number]  PCP:  [name], MD  Cardiologist:  Jenne Campus, MD    Referring MD: [name], MD   Chief Complaint  Patient presents with  . Follow-up  Doing well  History of Present Illness:    Kristen [name] is a 78 y.o. female with permanent atrial fibrillation, some swelling of lower extremities, essential hypertension comes today to my office follow-up she was late with the appointment she was lost on the way and she was very stressed out about this in spite of that her blood pressure is still reasonable.  He says she is doing well from cardiac point of view denies having any palpitations no shortness of breath tightness squeezing pressure burning chest.  Just today she seen vein specialist and there was some talks about potentially doing some procedure on her legs because of asymmetrical swelling.  She did have some ultrasounds done which showed no DVT.  Past Medical History:  Diagnosis Date  . Anxiety   . Atrial fibrillation ([place])   . Cardiac dysrhythmia, unspecified   . Cataract   . Depression   . Diverticula of colon   . Diverticulosis   . Essential hypertension, benign   . Fatigue   . Fibrocystic breast disease 1980s   General Surgery aspirates  . Gout   . History of diverticulitis 01/22/2016  . Irregular heart beat 2012  . Lumbar back pain   . Need for prophylactic vaccination and inoculation against influenza   . Osteopenia   . Scoliosis   . Urinary tract infection     Past Surgical History:  Procedure Laterality Date  . BREAST CYST ASPIRATION    . BREAST EXCISIONAL BIOPSY    . BREAST SURGERY  1980   cyst removed  . BUNIONECTOMY  2003   Dr Sharol Given  . cataract    . Eyelid surgery  2006  . KNEE CARTILAGE SURGERY  2002   Dr Para March  . skin growth    . TUBAL LIGATION  1980    Current Medications: Current Meds  Medication Sig  . alendronate  (FOSAMAX) 10 MG tablet TAKE (1) TABLET BY MOUTH PER WEEK TAKE ON EMPTY STOMACH WITH FULL GLASS OF WATER  . alendronate (FOSAMAX) 70 MG tablet TAKE (1) TABLET BY MOUTH WEEKLY WITH A FULL GLASS OF WATER ON AN EMPTY STOMACH  . allopurinol (ZYLOPRIM) 100 MG tablet TAKE 1 TABLET BY MOUTH EVERY DAY  . Ascorbic Acid (VITAMIN C) 100 MG tablet Take 100 mg by mouth daily.    Marland Kitchen b complex vitamins tablet Take 1 tablet by mouth daily.    . calcium citrate-vitamin D (CITRACAL+D) 315-200 MG-UNIT per tablet Take 1 tablet by mouth daily.    . clindamycin (CLINDAGEL) 1 % gel APPLY TO AFFECTED AREA TWICE A DAY  . colchicine 0.6 MG tablet Take 1 tablet (0.6 mg total) by mouth 2 (two) times daily. Take twice daily until pain resolves and then take as needed for gout flare.  . [facility] ANTI-FUNGAL 2 % powder APPLY TO AFFECTED AREA TWICE A DAY AFTER SKIN RESOLVES FROM KETOCONAZOLE CREAM (Patient taking differently: APPLY TO AFFECTED AREA TWICE A DAY AFTER SKIN RESOLVES FROM KETOCONAZOLE CREAM as needed)  . diazepam (VALIUM) 5 MG tablet TAKE 1/2 TABLET (2.5 MG TOTAL) BY MOUTH AT BEDTIME.  Marland Kitchen doxycycline (VIBRAMYCIN) 50 MG capsule TAKE 2 CAPSULES BY MOUTH 2 (TWO) TIMES DAILY.  Marland Kitchen  ELIQUIS 5 MG TABS tablet TAKE 1 TABLET BY MOUTH TWICE A DAY  . fish oil-omega-3 fatty acids 1000 MG capsule Take 2 g by mouth daily.  . furosemide (LASIX) 20 MG tablet Take 1 tablet (20 mg total) by mouth daily.  Marland Kitchen glucosamine-chondroitin (GLUCOSAMINE-CHONDROITIN DS) 500-400 MG tablet Take 1 tablet by mouth.  Marland Kitchen lisinopril-hydrochlorothiazide (ZESTORETIC) 20-25 MG tablet Take 1 tablet by mouth daily.  Marland Kitchen loratadine (CLARITIN) 10 MG tablet Take 1 tablet (10 mg total) by mouth daily.  . metoprolol succinate (TOPROL-XL) 100 MG 24 hr tablet Take 1 tablet (100 mg total) by mouth daily. Take with or immediately following a meal.  . mupirocin ointment (BACTROBAN) 2 % APPLY TOPICALLY TO AFFECTED AREA TWICE DAILY  . Nutritional Supplements (NUTRITIONAL SUPPLEMENT  PO) Take by mouth. Acai berry/green tea, fish oil   . polycarbophil (FIBERCON) 625 MG tablet Take 625 mg by mouth daily.  . potassium chloride (KLOR-CON) 20 MEQ packet MIX 1 PACKET IN LIQUID AND TAKE BY MOUTH DAILY AS DIRECTED  . Probiotic Product (PROBIOTIC-10 PO) Take by mouth.     Allergies:   Flecainide and Flecainide acetate   Social History   Socioeconomic History  . Marital status: Widowed    Spouse name: [name]  . Number of children: 1  . Years of education: Masters  . Highest education level: Master's degree (e.g., MA, MS, MEng, MEd, MSW, MBA)  Occupational History  . Occupation: Retired- Product manager: RETIRED  Social Needs  . Financial resource strain: Not hard at all  . Food insecurity    Worry: Never true    Inability: Never true  . Transportation needs    Medical: No    Non-medical: No  Tobacco Use  . Smoking status: Never Smoker  . Smokeless tobacco: Never Used  Substance and Sexual Activity  . Alcohol use: Yes    Comment: rarely  . Drug use: No  . Sexual activity: Not Currently  Lifestyle  . Physical activity    Days per week: 0 days    Minutes per session: 0 min  . Stress: Not at all  Relationships  . Social Herbalist on phone: Three times a week    Gets together: Never    Attends religious service: Never    Active member of club or organization: Yes    Attends meetings of clubs or organizations: 1 to 4 times per year    Relationship status: Widowed  Other Topics Concern  . Not on file  Social History Narrative   Emergency Contact: Leta Baptist [phone]   Who lives with you: self   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 Exercise: Pt dances regularly for shows   Seatbelts: Pt reports wearing seatbelt when in vehicles.    Sun Exposure/Protection: Pt reports not being in the sun very much   Hobbies: dancing, painting,       Working smoke alarm: yes       Home throw rugs: yes         Home free from clutter: yes                 Family History: The patient's family history includes Alcohol abuse in her father; Cancer in her maternal aunt and mother; Heart disease in her maternal uncle; Kidney disease in her father. ROS:   Please see the  history of present illness.    All 14 point review of systems negative except as described per history of present illness  EKGs/Labs/Other Studies Reviewed:      Recent Labs: No results found for requested labs within last 8760 hours.  Recent Lipid Panel    Component Value Date/Time   CHOL 224 (H) 06/09/2014 1427   TRIG 142 06/09/2014 1427   HDL 64 06/09/2014 1427   CHOLHDL 3.5 06/09/2014 1427   VLDL 28 06/09/2014 1427   LDLCALC 132 (H) 06/09/2014 1427    Physical Exam:    VS:  BP 110/60   Pulse 70   Ht 5' 2.5" (1.588 m)   Wt 192 lb 3.2 oz (87.2 kg)   SpO2 98%   BMI 34.59 kg/m     Wt Readings from Last 3 Encounters:  07/23/19 192 lb 3.2 oz (87.2 kg)  01/15/19 194 lb 12.8 oz (88.4 kg)  10/11/18 198 lb 6.4 oz (90 kg)     GEN:  Well nourished, well developed in no acute distress HEENT: Normal NECK: No JVD; No carotid bruits LYMPHATICS: No lymphadenopathy CARDIAC: Irregularly irregular, no murmurs, no rubs, no gallops RESPIRATORY:  Clear to auscultation without rales, wheezing or rhonchi  ABDOMEN: Soft, non-tender, non-distended MUSCULOSKELETAL:  No edema; No deformity  SKIN: Warm and dry LOWER EXTREMITIES: no swelling NEUROLOGIC:  Alert and oriented x 3 PSYCHIATRIC:  Normal affect   ASSESSMENT:    1. Chronic diastolic heart failure ([place])   2. Permanent atrial fibrillation   3. Varicose veins of leg with edema, right   4. Ankle swelling, right    PLAN:    In order of problems listed above:  1. Permanent atrial fibrillation.  Anticoagulated rate is controlled we will continue present management. 2. Chronic diastolic congestive heart failure stable compensated  she is doing well. 3. Varicose vein on the leg talks about some procedure done that will be done probably within the next few months 4. Overall she is doing well from my standpoint review I will continue present management.  We talk again about need to weight loss she understands she will try to do that   Medication Adjustments/Labs and Tests Ordered: Current medicines are reviewed at length with the patient today.  Concerns regarding medicines are outlined above.  No orders of the defined types were placed in this encounter.  Medication changes: No orders of the defined types were placed in this encounter.   Signed, Park Liter, MD, [facility] 07/23/2019 3:50 PM    [place]

## 2019-08-07 DIAGNOSIS — I4891 Unspecified atrial fibrillation: Secondary | ICD-10-CM | POA: Diagnosis not present

## 2019-08-07 DIAGNOSIS — K8010 Calculus of gallbladder with chronic cholecystitis without obstruction: Secondary | ICD-10-CM | POA: Diagnosis not present

## 2019-08-08 ENCOUNTER — Ambulatory Visit: Payer: Self-pay | Admitting: General Surgery

## 2019-08-08 DIAGNOSIS — J189 Pneumonia, unspecified organism: Secondary | ICD-10-CM

## 2019-08-08 HISTORY — DX: Pneumonia, unspecified organism: J18.9

## 2019-08-15 NOTE — Progress Notes (Signed)
Called and spoke w/ Eyvonne Mechanic, triage nurse for dr Kieth Brightly, via phone requesting cardiac clearance.  Eyvonne Mechanic stated she will send dr kinsinger's nurse a message.

## 2019-08-19 ENCOUNTER — Other Ambulatory Visit (HOSPITAL_COMMUNITY)
Admission: RE | Admit: 2019-08-19 | Discharge: 2019-08-19 | Disposition: A | Discharge status: Home / Self Care | Payer: Medicare Other | Source: Ambulatory Visit | Attending: General Surgery | Admitting: General Surgery

## 2019-08-19 ENCOUNTER — Other Ambulatory Visit: Payer: Self-pay

## 2019-08-19 ENCOUNTER — Encounter (HOSPITAL_BASED_OUTPATIENT_CLINIC_OR_DEPARTMENT_OTHER): Payer: Self-pay | Admitting: *Deleted

## 2019-08-19 DIAGNOSIS — Z01812 Encounter for preprocedural laboratory examination: Secondary | ICD-10-CM | POA: Diagnosis not present

## 2019-08-19 DIAGNOSIS — Z20828 Contact with and (suspected) exposure to other viral communicable diseases: Secondary | ICD-10-CM | POA: Insufficient documentation

## 2019-08-19 MED ORDER — FUROSEMIDE 20 MG PO TABS: 20.0000 mg | ORAL_TABLET | Freq: Every day | ORAL | 1 refills | Status: DC

## 2019-08-19 NOTE — Progress Notes (Addendum)
ADDENDUM:  Chart reviewed by anesthesia, Konrad Felix PA,  Do CMP dos and ok to proceed. (ordered entered)  Spoke w/ via phone for pre-op interview--- PT Lab needs dos----  Istat 8 and EKG COVID test ------ 08-19-2019 Arrive at ------- 0530 NPO after ------ [place] Medications to take morning of surgery ----- Claritin, Toprol, Allopurinol Diabetic medication ----- n/a Patient Special Instructions ----- n/a Pre-Op special Istructions ----- n/a  Patient verbalized understanding of instructions that were given at this phone interview. Patient denies cardiac s&s shortness of breath, chest pain, fever, cough a this phone interview.   Anesthesia Review:  Hx Permanent AFib, chronic CHF diastolic.  Pt stated currently peripheral edema  PCP:  Dr [name] Cardiologist :  Dr Agustin Cree for CHF and PAF (lov 07-23-2019 epic) Chest x-ray : Chest CT angio 02-23-2016 epic EKG : 03-27-2013 epic Echo : 09-07-2010 epic Stress test:  No Cardiac Cath :  12-17-2010 epic Sleep Study/ CPAP : Fasting Blood Sugar :    N  / A  Blood Thinner/ Instructions Maryjane Hurter Dose: Eliquis/ per pt Instructions given by dr Kieth Brightly office/  Cardiac clearance given by Bernerd Pho PA dated 08-14-2019 (whom sent clearance message to my epic in-basket, printed and placed with chart) /  Per pt last dose tonight 08-19-2019 ASA / Instructions/ Last Dose :  NO

## 2019-08-20 LAB — SARS CORONAVIRUS 2 (TAT 6-24 HRS): SARS Coronavirus 2: NEGATIVE

## 2019-08-22 ENCOUNTER — Ambulatory Visit (HOSPITAL_BASED_OUTPATIENT_CLINIC_OR_DEPARTMENT_OTHER): Payer: Medicare Other | Admitting: Physician Assistant

## 2019-08-22 ENCOUNTER — Encounter (HOSPITAL_BASED_OUTPATIENT_CLINIC_OR_DEPARTMENT_OTHER)
Admission: RE | Disposition: A | Discharge status: Home / Self Care | Payer: Self-pay | Source: Home / Self Care | Attending: General Surgery

## 2019-08-22 ENCOUNTER — Ambulatory Visit (HOSPITAL_BASED_OUTPATIENT_CLINIC_OR_DEPARTMENT_OTHER)
Admission: RE | Admit: 2019-08-22 | Discharge: 2019-08-22 | Disposition: A | Discharge status: Home / Self Care | Payer: Medicare Other | Attending: General Surgery | Admitting: General Surgery

## 2019-08-22 ENCOUNTER — Encounter (HOSPITAL_BASED_OUTPATIENT_CLINIC_OR_DEPARTMENT_OTHER): Payer: Self-pay

## 2019-08-22 DIAGNOSIS — Z7901 Long term (current) use of anticoagulants: Secondary | ICD-10-CM | POA: Insufficient documentation

## 2019-08-22 DIAGNOSIS — M1990 Unspecified osteoarthritis, unspecified site: Secondary | ICD-10-CM | POA: Insufficient documentation

## 2019-08-22 DIAGNOSIS — K8010 Calculus of gallbladder with chronic cholecystitis without obstruction: Secondary | ICD-10-CM | POA: Diagnosis not present

## 2019-08-22 DIAGNOSIS — K5730 Diverticulosis of large intestine without perforation or abscess without bleeding: Secondary | ICD-10-CM | POA: Diagnosis not present

## 2019-08-22 DIAGNOSIS — I130 Hypertensive heart and chronic kidney disease with heart failure and stage 1 through stage 4 chronic kidney disease, or unspecified chronic kidney disease: Secondary | ICD-10-CM | POA: Diagnosis not present

## 2019-08-22 DIAGNOSIS — N189 Chronic kidney disease, unspecified: Secondary | ICD-10-CM | POA: Diagnosis not present

## 2019-08-22 DIAGNOSIS — M419 Scoliosis, unspecified: Secondary | ICD-10-CM | POA: Diagnosis not present

## 2019-08-22 DIAGNOSIS — R6889 Other general symptoms and signs: Secondary | ICD-10-CM | POA: Diagnosis not present

## 2019-08-22 DIAGNOSIS — I110 Hypertensive heart disease with heart failure: Secondary | ICD-10-CM | POA: Insufficient documentation

## 2019-08-22 DIAGNOSIS — I872 Venous insufficiency (chronic) (peripheral): Secondary | ICD-10-CM | POA: Diagnosis not present

## 2019-08-22 DIAGNOSIS — E669 Obesity, unspecified: Secondary | ICD-10-CM

## 2019-08-22 DIAGNOSIS — M109 Gout, unspecified: Secondary | ICD-10-CM | POA: Diagnosis not present

## 2019-08-22 DIAGNOSIS — Z79899 Other long term (current) drug therapy: Secondary | ICD-10-CM | POA: Insufficient documentation

## 2019-08-22 DIAGNOSIS — I5032 Chronic diastolic (congestive) heart failure: Secondary | ICD-10-CM | POA: Insufficient documentation

## 2019-08-22 DIAGNOSIS — I4821 Permanent atrial fibrillation: Secondary | ICD-10-CM | POA: Insufficient documentation

## 2019-08-22 HISTORY — DX: Diverticulosis of large intestine without perforation or abscess without bleeding: K57.30

## 2019-08-22 HISTORY — DX: Permanent atrial fibrillation: I48.21

## 2019-08-22 HISTORY — DX: Long term (current) use of anticoagulants: Z79.01

## 2019-08-22 HISTORY — DX: Stress incontinence (female) (male): N39.3

## 2019-08-22 HISTORY — DX: Complete loss of teeth, unspecified cause, unspecified class: K08.109

## 2019-08-22 HISTORY — DX: Diaphragmatic hernia without obstruction or gangrene: K44.9

## 2019-08-22 HISTORY — DX: Complete loss of teeth, unspecified cause, unspecified class: Z97.2

## 2019-08-22 HISTORY — DX: Venous insufficiency (chronic) (peripheral): I87.2

## 2019-08-22 HISTORY — DX: Gastro-esophageal reflux disease without esophagitis: K21.9

## 2019-08-22 HISTORY — DX: Localized edema: R60.0

## 2019-08-22 HISTORY — DX: Calculus of gallbladder with chronic cholecystitis without obstruction: K80.10

## 2019-08-22 HISTORY — DX: Personal history of urinary (tract) infections: Z87.440

## 2019-08-22 HISTORY — DX: Unspecified osteoarthritis, unspecified site: M19.90

## 2019-08-22 HISTORY — PX: CHOLECYSTECTOMY: SHX55

## 2019-08-22 LAB — POCT I-STAT, CHEM 8
BUN: 15 mg/dL (ref 8–23)
Calcium, Ion: 1.29 mmol/L (ref 1.15–1.40)
Chloride: 100 mmol/L (ref 98–111)
Creatinine, Ser: 1.3 mg/dL — ABNORMAL HIGH (ref 0.44–1.00)
Glucose, Bld: 96 mg/dL (ref 70–99)
HCT: 40 % (ref 36.0–46.0)
Hemoglobin: 13.6 g/dL (ref 12.0–15.0)
Potassium: 3.6 mmol/L (ref 3.5–5.1)
Sodium: 141 mmol/L (ref 135–145)
TCO2: 28 mmol/L (ref 22–32)

## 2019-08-22 SURGERY — LAPAROSCOPIC CHOLECYSTECTOMY
Anesthesia: General | Site: Abdomen

## 2019-08-22 MED ORDER — KETOROLAC TROMETHAMINE 30 MG/ML IJ SOLN
INTRAMUSCULAR | Status: AC
  Filled 2019-08-22: qty 1

## 2019-08-22 MED ORDER — CEFAZOLIN SODIUM-DEXTROSE 2-4 GM/100ML-% IV SOLN
2.0000 g | INTRAVENOUS | Status: AC
  Administered 2019-08-22: 2 g via INTRAVENOUS
  Filled 2019-08-22: qty 100

## 2019-08-22 MED ORDER — METOCLOPRAMIDE HCL 5 MG/ML IJ SOLN
5.0000 mg | Freq: Once | INTRAMUSCULAR | Status: AC
  Administered 2019-08-22: 5 mg via INTRAVENOUS
  Filled 2019-08-22: qty 1

## 2019-08-22 MED ORDER — PHENYLEPHRINE 40 MCG/ML (10ML) SYRINGE FOR IV PUSH (FOR BLOOD PRESSURE SUPPORT)
PREFILLED_SYRINGE | INTRAVENOUS | Status: AC
  Filled 2019-08-22: qty 10

## 2019-08-22 MED ORDER — CEFAZOLIN SODIUM-DEXTROSE 2-4 GM/100ML-% IV SOLN
INTRAVENOUS | Status: AC
  Filled 2019-08-22: qty 100

## 2019-08-22 MED ORDER — FENTANYL CITRATE (PF) 100 MCG/2ML IJ SOLN
INTRAMUSCULAR | Status: AC
  Filled 2019-08-22: qty 2

## 2019-08-22 MED ORDER — HYDROCODONE-ACETAMINOPHEN 5-325 MG PO TABS
1.0000 | ORAL_TABLET | Freq: Once | ORAL | Status: AC
  Administered 2019-08-22: 1 via ORAL
  Filled 2019-08-22: qty 1

## 2019-08-22 MED ORDER — DEXAMETHASONE SODIUM PHOSPHATE 10 MG/ML IJ SOLN
INTRAMUSCULAR | Status: AC
  Filled 2019-08-22: qty 1

## 2019-08-22 MED ORDER — KETOROLAC TROMETHAMINE 15 MG/ML IJ SOLN
15.0000 mg | INTRAMUSCULAR | Status: AC
  Administered 2019-08-22: 15 mg via INTRAVENOUS
  Filled 2019-08-22: qty 1

## 2019-08-22 MED ORDER — FENTANYL CITRATE (PF) 100 MCG/2ML IJ SOLN
INTRAMUSCULAR | Status: DC | PRN
  Administered 2019-08-22 (×2): 50 ug via INTRAVENOUS

## 2019-08-22 MED ORDER — PROPOFOL 10 MG/ML IV BOLUS
INTRAVENOUS | Status: DC | PRN
  Administered 2019-08-22: 30 mg via INTRAVENOUS
  Administered 2019-08-22: 130 mg via INTRAVENOUS

## 2019-08-22 MED ORDER — PROPOFOL 10 MG/ML IV BOLUS
INTRAVENOUS | Status: AC
  Filled 2019-08-22: qty 20

## 2019-08-22 MED ORDER — FENTANYL CITRATE (PF) 100 MCG/2ML IJ SOLN
25.0000 ug | INTRAMUSCULAR | Status: DC | PRN
  Administered 2019-08-22 (×2): 50 ug via INTRAVENOUS
  Filled 2019-08-22: qty 1

## 2019-08-22 MED ORDER — CHLORHEXIDINE GLUCONATE CLOTH 2 % EX PADS
6.0000 | MEDICATED_PAD | Freq: Once | CUTANEOUS | Status: DC
  Filled 2019-08-22: qty 6

## 2019-08-22 MED ORDER — ACETAMINOPHEN 500 MG PO TABS
1000.0000 mg | ORAL_TABLET | ORAL | Status: AC
  Administered 2019-08-22: 1000 mg via ORAL
  Filled 2019-08-22: qty 2

## 2019-08-22 MED ORDER — HYDROCODONE-ACETAMINOPHEN 5-325 MG PO TABS: 1.0000 | ORAL_TABLET | Freq: Four times a day (QID) | ORAL | 0 refills | Status: DC | PRN

## 2019-08-22 MED ORDER — METOCLOPRAMIDE HCL 5 MG/ML IJ SOLN
INTRAMUSCULAR | Status: AC
  Filled 2019-08-22: qty 2

## 2019-08-22 MED ORDER — ACETAMINOPHEN 500 MG PO TABS
ORAL_TABLET | ORAL | Status: AC
  Filled 2019-08-22: qty 2

## 2019-08-22 MED ORDER — PHENYLEPHRINE HCL (PRESSORS) 10 MG/ML IV SOLN
INTRAVENOUS | Status: DC | PRN
  Administered 2019-08-22: 80 ug via INTRAVENOUS
  Administered 2019-08-22: 40 ug via INTRAVENOUS

## 2019-08-22 MED ORDER — LIDOCAINE 2% (20 MG/ML) 5 ML SYRINGE
INTRAMUSCULAR | Status: AC
  Filled 2019-08-22: qty 5

## 2019-08-22 MED ORDER — ENSURE PRE-SURGERY PO LIQD
296.0000 mL | Freq: Once | ORAL | Status: DC
  Filled 2019-08-22: qty 296

## 2019-08-22 MED ORDER — IBUPROFEN 800 MG PO TABS: 800.0000 mg | ORAL_TABLET | Freq: Three times a day (TID) | ORAL | 0 refills | Status: DC | PRN

## 2019-08-22 MED ORDER — HYDROCODONE-ACETAMINOPHEN 5-325 MG PO TABS
ORAL_TABLET | ORAL | Status: AC
  Filled 2019-08-22: qty 1

## 2019-08-22 MED ORDER — ONDANSETRON HCL 4 MG/2ML IJ SOLN
INTRAMUSCULAR | Status: AC
  Filled 2019-08-22: qty 2

## 2019-08-22 MED ORDER — DEXAMETHASONE SODIUM PHOSPHATE 10 MG/ML IJ SOLN
INTRAMUSCULAR | Status: DC | PRN
  Administered 2019-08-22: 5 mg via INTRAVENOUS

## 2019-08-22 MED ORDER — LIDOCAINE 2% (20 MG/ML) 5 ML SYRINGE
INTRAMUSCULAR | Status: DC | PRN
  Administered 2019-08-22: 100 mg via INTRAVENOUS

## 2019-08-22 MED ORDER — SUGAMMADEX SODIUM 200 MG/2ML IV SOLN
INTRAVENOUS | Status: DC | PRN
  Administered 2019-08-22: 200 mg via INTRAVENOUS

## 2019-08-22 MED ORDER — ARTIFICIAL TEARS OPHTHALMIC OINT
TOPICAL_OINTMENT | OPHTHALMIC | Status: AC
  Filled 2019-08-22: qty 3.5

## 2019-08-22 MED ORDER — ROCURONIUM BROMIDE 10 MG/ML (PF) SYRINGE
PREFILLED_SYRINGE | INTRAVENOUS | Status: DC | PRN
  Administered 2019-08-22: 40 mg via INTRAVENOUS

## 2019-08-22 MED ORDER — LACTATED RINGERS IV SOLN
INTRAVENOUS | Status: DC
  Administered 2019-08-22: 07:00:00 via INTRAVENOUS
  Filled 2019-08-22: qty 1000

## 2019-08-22 MED ORDER — BUPIVACAINE HCL 0.5 % IJ SOLN
INTRAMUSCULAR | Status: DC | PRN
  Administered 2019-08-22: 30 mL

## 2019-08-22 MED ORDER — ONDANSETRON HCL 4 MG/2ML IJ SOLN
INTRAMUSCULAR | Status: DC | PRN
  Administered 2019-08-22: 4 mg via INTRAVENOUS

## 2019-08-22 SURGICAL SUPPLY — 41 items
APPLIER CLIP ROT 10 11.4 M/L (STAPLE)
CABLE HIGH FREQUENCY MONO STRZ (ELECTRODE) ×6 IMPLANT
CATH CHOLANG 76X19 KUMAR (CATHETERS) IMPLANT
CHLORAPREP W/TINT 26ML (MISCELLANEOUS) ×3 IMPLANT
CLIP APPLIE ROT 10 11.4 M/L (STAPLE) IMPLANT
CLIP VESOLOCK MED LG 6/CT (CLIP) ×6 IMPLANT
COVER WAND RF STERILE (DRAPES) ×3 IMPLANT
DECANTER SPIKE VIAL GLASS SM (MISCELLANEOUS) ×3 IMPLANT
DERMABOND ADVANCED (GAUZE/BANDAGES/DRESSINGS) ×2
DERMABOND ADVANCED .7 DNX12 (GAUZE/BANDAGES/DRESSINGS) ×1 IMPLANT
DRAIN CHANNEL 19F RND (DRAIN) IMPLANT
DRAPE C-ARM 42X72 X-RAY (DRAPES) IMPLANT
ELECT REM PT RETURN 9FT ADLT (ELECTROSURGICAL) ×3
ELECTRODE REM PT RTRN 9FT ADLT (ELECTROSURGICAL) ×1 IMPLANT
EVACUATOR SILICONE 100CC (DRAIN) IMPLANT
GLOVE BIOGEL PI IND STRL 7.0 (GLOVE) ×2 IMPLANT
GLOVE BIOGEL PI INDICATOR 7.0 (GLOVE) ×4
GLOVE SURG SS PI 7.0 STRL IVOR (GLOVE) ×6 IMPLANT
GOWN STRL REUS W/TWL LRG LVL3 (GOWN DISPOSABLE) ×6 IMPLANT
GRASPER SUT TROCAR 14GX15 (MISCELLANEOUS) ×3 IMPLANT
HEMOSTAT SNOW SURGICEL 2X4 (HEMOSTASIS) ×3 IMPLANT
IRRIG SUCT STRYKERFLOW 2 WTIP (MISCELLANEOUS) ×3
IRRIGATION SUCT STRKRFLW 2 WTP (MISCELLANEOUS) ×1 IMPLANT
IV CATH 14GX2 1/4 (CATHETERS) IMPLANT
NS IRRIG 500ML POUR BTL (IV SOLUTION) ×3 IMPLANT
PACK BASIN DAY SURGERY FS (CUSTOM PROCEDURE TRAY) ×3 IMPLANT
POUCH RETRIEVAL ECOSAC 10 (ENDOMECHANICALS) ×1 IMPLANT
POUCH RETRIEVAL ECOSAC 10MM (ENDOMECHANICALS) ×2
SCISSORS LAP 5X35 DISP (ENDOMECHANICALS) ×3 IMPLANT
STOPCOCK 4 WAY LG BORE MALE ST (IV SETS) IMPLANT
SUT ETHILON 2 0 PS N (SUTURE) IMPLANT
SUT MNCRL AB 4-0 PS2 18 (SUTURE) ×6 IMPLANT
SUT VICRYL 0 ENDOLOOP (SUTURE) IMPLANT
SUT VICRYL 0 UR6 27IN ABS (SUTURE) IMPLANT
TOWEL OR 17X26 10 PK STRL BLUE (TOWEL DISPOSABLE) ×6 IMPLANT
TRAY LAPAROSCOPIC (CUSTOM PROCEDURE TRAY) ×3 IMPLANT
TROCAR BLADELESS OPT 12M 100M (ENDOMECHANICALS) IMPLANT
TROCAR BLADELESS OPT 5 100 (ENDOMECHANICALS) ×9 IMPLANT
TROCAR XCEL NON-BLD 11X100MML (ENDOMECHANICALS) ×3 IMPLANT
TUBING EVAC SMOKE HEATED PNEUM (TUBING) ×3 IMPLANT
WARMER LAPAROSCOPE (MISCELLANEOUS) ×3 IMPLANT

## 2019-08-22 NOTE — Discharge Instructions (Signed)
°  Post Anesthesia Home Care Instructions  Activity: Get plenty of rest for the remainder of the day. A responsible individual must stay with you for 24 hours following the procedure.  For the next 24 hours, DO NOT: -Drive a car -Paediatric nurse -Drink alcoholic beverages -Take any medication unless instructed by your physician -Make any legal decisions or sign important papers.  Meals: Start with liquid foods such as gelatin or soup. Progress to regular foods as tolerated. Avoid greasy, spicy, heavy foods. If nausea and/or vomiting occur, drink only clear liquids until the nausea and/or vomiting subsides. Call your physician if vomiting continues.  Special Instructions/Symptoms: Your throat may feel dry or sore from the anesthesia or the breathing tube placed in your throat during surgery. If this causes discomfort, gargle with warm salt water. The discomfort should disappear within 24 hours.  HOME CARE INSTRUCTIONS - LAPAROSCOPY  Wound Care: The bandaids or dressing which are placed over the skin openings may be removed the day after surgery. The incision should be kept clean and dry. The stitches do not need to be removed. Should the incision become sore, red, and swollen after the first week, check with your doctor.  Personal Hygiene: Shower the day after your procedure. Always wipe from front to back after elimination.   Activity: Do not drive or operate any equipment today. The effects of the anesthesia are still present and drowsiness may result. Rest today, not necessarily flat bed rest, just take it easy. You may resume your normal activity in one to three days or as instructed by your physician.   Diet: Eat a light diet as desired this evening. You may resume a regular diet tomorrow.  Return to Work: Two to three days or as indicated by your doctor.  Expectations After Surgery: Your surgery will cause vaginal drainage or spotting which may continue for 2-3 days. Mild  abdominal discomfort or tenderness is not unusual and some shoulder pain may also be noted which can be relieved by lying flat in pain.  Call Your Doctor If these Occur:  Persistent or heavy bleeding at incision site       Redness or swelling around incision       Elevation of temperature greater than 100 degrees F

## 2019-08-22 NOTE — Anesthesia Procedure Notes (Signed)
Procedure Name: Intubation Date/Time: 08/22/2019 7:39 AM Performed by: Wanita Chamberlain, CRNA Pre-anesthesia Checklist: Patient identified, Emergency Drugs available, Suction available, Patient being monitored and Timeout performed Patient Re-evaluated:Patient Re-evaluated prior to induction Oxygen Delivery Method: Circle system utilized Preoxygenation: Pre-oxygenation with 100% oxygen Induction Type: IV induction Ventilation: Mask ventilation without difficulty Laryngoscope Size: Mac and 3 Grade View: Grade I Tube type: Oral Number of attempts: 1 Airway Equipment and Method: Stylet Placement Confirmation: breath sounds checked- equal and bilateral,  CO2 detector,  positive ETCO2 and ETT inserted through vocal cords under direct vision Secured at: 21 cm Tube secured with: Tape Dental Injury: Teeth and Oropharynx as per pre-operative assessment

## 2019-08-22 NOTE — Transfer of Care (Signed)
Immediate Anesthesia Transfer of Care Note  Patient: Kristen [name]  Procedure(s) Performed: LAPAROSCOPIC CHOLECYSTECTOMY (N/A Abdomen)  Patient Location: PACU  Anesthesia Type:General  Level of Consciousness: awake, alert , oriented and patient cooperative  Airway & Oxygen Therapy: Patient Spontanous Breathing and Patient connected to nasal cannula oxygen  Post-op Assessment: Report given to RN and Post -op Vital signs reviewed and stable  Post vital signs: Reviewed and stable  Last Vitals:  Vitals Value Taken Time  BP 147/91 08/22/19 0858  Temp 36.4 C 08/22/19 0857  Pulse 59 08/22/19 0900  Resp 15 08/22/19 0900  SpO2 92 % 08/22/19 0900  Vitals shown include unvalidated device data.  Last Pain:  Vitals:   08/22/19 0627  TempSrc: Oral  PainSc: 0-No pain      Patients Stated Pain Goal: 5 (15/04/13 6438)  Complications: No apparent anesthesia complications

## 2019-08-22 NOTE — Anesthesia Preprocedure Evaluation (Addendum)
Anesthesia Evaluation  Patient identified by MRN, date of birth, ID band Patient awake    Reviewed: Allergy & Precautions, NPO status , Patient's Chart, lab work & pertinent test results  Airway Mallampati: II  TM Distance: >3 FB     Dental  (+) Edentulous Upper, Edentulous Lower, Dental Advisory Given   Pulmonary    breath sounds clear to auscultation       Cardiovascular hypertension, +CHF  + dysrhythmias Atrial Fibrillation  Rhythm:Regular Rate:Normal     Neuro/Psych    GI/Hepatic hiatal hernia, GERD  ,  Endo/Other    Renal/GU Renal disease     Musculoskeletal   Abdominal   Peds  Hematology   Anesthesia Other Findings   Reproductive/Obstetrics                           Anesthesia Physical Anesthesia Plan  ASA: III  Anesthesia Plan: General   Post-op Pain Management:    Induction: Intravenous  PONV Risk Score and Plan: 3 and Ondansetron, Dexamethasone and Midazolam  Airway Management Planned: Oral ETT  Additional Equipment:   Intra-op Plan:   Post-operative Plan: Extubation in OR  Informed Consent: I have reviewed the patients History and Physical, chart, labs and discussed the procedure including the risks, benefits and alternatives for the proposed anesthesia with the patient or authorized representative who has indicated his/her understanding and acceptance.     Dental advisory given  Plan Discussed with: CRNA and Anesthesiologist  Anesthesia Plan Comments:         Anesthesia Quick Evaluation

## 2019-08-22 NOTE — Anesthesia Postprocedure Evaluation (Signed)
Anesthesia Post Note  Patient: Kristen [name]  Procedure(s) Performed: LAPAROSCOPIC CHOLECYSTECTOMY (N/A Abdomen)     Patient location during evaluation: PACU Anesthesia Type: General Level of consciousness: awake Pain management: pain level controlled Vital Signs Assessment: post-procedure vital signs reviewed and stable Cardiovascular status: stable Postop Assessment: no apparent nausea or vomiting Anesthetic complications: no    Last Vitals:  Vitals:   08/22/19 1045 08/22/19 1115  BP:  137/74  Pulse: 67 74  Resp: 14 14  Temp:  36.6 C  SpO2: 91% 92%    Last Pain:  Vitals:   08/22/19 1115  TempSrc:   PainSc: 6                  Cleola Perryman

## 2019-08-22 NOTE — Op Note (Signed)
PATIENT:  Kristen [name]  78 y.o. female  PRE-OPERATIVE DIAGNOSIS:  CALCULOUS CHOLECYSTITIS  POST-OPERATIVE DIAGNOSIS:  CALCULOUS CHOLECYSTITIS  PROCEDURE:  Procedure(s): LAPAROSCOPIC CHOLECYSTECTOMY   SURGEON:  Surgeon(s): [name], [name], MD  ASSISTANT: Jeralyn Bennett  ANESTHESIA:   local and general  Indications for procedure: Kristen [name] is a 78 y.o. female with symptoms of Abdominal pain consistent with gallbladder disease, Confirmed by Ultrasound.  Description of procedure: The patient was brought into the operative suite, placed supine. Anesthesia was administered with endotracheal tube. Patient was strapped in place and foot board was secured. All pressure points were offloaded by foam padding. The patient was prepped and draped in the usual sterile fashion.  A small incision was made to the right of the umbilicus. A 54mm trocar was inserted into the peritoneal cavity with optical entry. Pneumoperitoneum was applied with high flow low pressure. 2 80mm trocars were placed in the RUQ. A 17mm trocar was placed in the subxiphoid space. Marcaine was infused to the subxiphoid space and lateral upper right abdomen in the transversus abdominis plane. Next the patient was placed in reverse trendelenberg. The gallbladder was white and dilated with adhesions of omentum to the wall. The adhesions were taken down with cautery.  The gallbladder was retracted cephalad and lateral. The peritoneum was reflected off the infundibulum working lateral to medial. The cystic duct and cystic artery were identified and further dissection revealed a critical view. The cystic duct and cystic artery were doubly clipped and ligated.   The gallbladder was removed off the liver bed with cautery. The Gallbladder was placed in a specimen bag. The gallbladder fossa was irrigated and hemostasis was applied with cautery. Due to small ooze, snow surgicel was put in place with good effect. The gallbladder  was removed via the 72mm trocar. The fascial defect was closed with interrupted 0 vicryl suture via laparoscopic trans-fascial suture passer. Pneumoperitoneum was removed, all trocar were removed. All incisions were closed with 4-0 monocryl subcuticular stitch. The patient woke from anesthesia and was brought to PACU in stable condition. All counts were correct  Findings: chronically inflamed gallbladder  Specimen: gallbladder  Blood loss: 40 ml  Local anesthesia: 30 ml marcaine  Complications: none  PLAN OF CARE: Discharge to home after PACU  PATIENT DISPOSITION:  PACU - hemodynamically stable.  Kristen [name], M.D. General, Bariatric, & Minimally Invasive Surgery [facility]

## 2019-08-22 NOTE — H&P (Signed)
Kristen [name] is an 78 y.o. female.   Chief Complaint: abdominal pain HPI: 78 yo female with multiple months of epigastric pain and nausea. She was found to have gallstones. She presents today for cholecystectomy  Past Medical History:  Diagnosis Date  . Anticoagulant long-term use    elquis -- managed by cardiology  . Anxiety   . Chronic calculous cholecystitis   . Chronic venous insufficiency    w/  varicose veins  . Depression   . Diastolic CHF, chronic ([place])    followed by cardiology  . Diverticulosis of colon   . Edema of both lower extremities    per pt mostly in summer time  . Essential hypertension, benign   . Fibrocystic breast disease   . Full dentures   . GERD (gastroesophageal reflux disease)    occasional tums and does not eat prior to bedtime  . Gout    08-19-2019  per pt last episode 07/ 2020  . Hiatal hernia   . History of diverticulitis 01/22/2016  . History of recurrent UTIs   . OA (osteoarthritis)    knees, lower back  . Permanent atrial fibrillation [facility]) cardiologist--- dr Agustin Cree   first dx 10/ 2011--- histroy DCCV 11-25-2010 by dr Wynonia Lawman (pt's previous cardiologist) and Cardiac cath 12-17-2010 no significant disease  . Scoliosis   . Stress incontinence in female     Past Surgical History:  Procedure Laterality Date  . BLEPHAROPLASTY Bilateral 02-22-2010  dr Georgia Lopes   upper eyelid's  . BUNIONECTOMY Right 2003  . CATARACT EXTRACTION W/ INTRAOCULAR LENS  IMPLANT, BILATERAL  2018  . KNEE ARTHROSCOPY Left 2002  . TUBAL LIGATION Bilateral 1980   AND RIGHT BREAST EXCISION CYST (BENIGN)    Family History  Problem Relation Age of Onset  . Cancer Mother        ovarian  . Kidney disease Father   . Alcohol abuse Father   . Cancer Maternal Aunt        lung- smoker  . Heart disease Maternal Uncle    Social History:  reports that she has never smoked. She has never used smokeless tobacco. She reports current alcohol use. She reports that she does  not use drugs.  Allergies:  Allergies  Allergen Reactions  . Flecainide Acetate Other (See Comments)    REACTION: SOB, swelling (hospitalized)    Medications Prior to Admission  Medication Sig Dispense Refill  . alendronate (FOSAMAX) 10 MG tablet TAKE (1) TABLET BY MOUTH PER WEEK TAKE ON EMPTY STOMACH WITH FULL GLASS OF WATER (Patient taking differently: Take 10 mg by mouth daily before breakfast. TAKE (1) TABLET BY MOUTH PER WEEK TAKE ON EMPTY STOMACH WITH FULL GLASS OF WATER) 4 tablet 10  . allopurinol (ZYLOPRIM) 100 MG tablet TAKE 1 TABLET BY MOUTH EVERY DAY (Patient taking differently: No sig reported) 90 tablet 1  . calcium carbonate (OSCAL) 1500 (600 Ca) MG TABS tablet Take by mouth daily.    . calcium carbonate (TUMS - DOSED IN MG ELEMENTAL CALCIUM) 500 MG chewable tablet Chew 1 tablet by mouth as needed for indigestion or heartburn.    . cholecalciferol (VITAMIN D3) 25 MCG (1000 UT) tablet Take 1,000 Units by mouth daily.    Marland Kitchen CINNAMON PO Take by mouth daily.    . diazepam (VALIUM) 5 MG tablet TAKE 1/2 TABLET (2.5 MG TOTAL) BY MOUTH AT BEDTIME. (Patient taking differently: Take 2.5 mg by mouth at bedtime. ) 30 tablet 0  . ELIQUIS 5 MG  TABS tablet TAKE 1 TABLET BY MOUTH TWICE A DAY (Patient taking differently: Take 5 mg by mouth 2 (two) times daily. ) 180 tablet 0  . Flax Oil-Fish Oil-Borage Oil (FISH-FLAX-BORAGE) CAPS Take 1 capsule by mouth daily.    . furosemide (LASIX) 20 MG tablet Take 1 tablet (20 mg total) by mouth daily. (Patient taking differently: Take 20 mg by mouth daily. ) 90 tablet 1  . glucosamine-chondroitin (GLUCOSAMINE-CHONDROITIN DS) 500-400 MG tablet Take 1 tablet by mouth daily.     Marland Kitchen lisinopril-hydrochlorothiazide (ZESTORETIC) 20-25 MG tablet Take 1 tablet by mouth daily. (Patient taking differently: Take 1 tablet by mouth daily. ) 90 tablet 1  . loratadine (CLARITIN) 10 MG tablet Take 1 tablet (10 mg total) by mouth daily. 90 tablet 1  . metoprolol succinate  (TOPROL-XL) 100 MG 24 hr tablet Take 1 tablet (100 mg total) by mouth daily. Take with or immediately following a meal. (Patient taking differently: Take 100 mg by mouth daily. Take with or immediately following a meal.) 90 tablet 1  . Multiple Vitamins-Minerals (EMERGEN-C IMMUNE) PACK Take by mouth daily.    . Multiple Vitamins-Minerals (MULTIVITAMIN WOMEN 50+) TABS Take by mouth daily.    . Nutritional Supplements (ECHINACEA/GOLDEN SEAL PO) Take by mouth daily.    . polycarbophil (FIBERCON) 625 MG tablet Take 625 mg by mouth daily.    . potassium chloride (KLOR-CON) 20 MEQ packet MIX 1 PACKET IN LIQUID AND TAKE BY MOUTH DAILY AS DIRECTED (Patient taking differently: Take 20 mEq by mouth daily. MIX 1 PACKET IN LIQUID AND TAKE BY MOUTH DAILY AS DIRECTED) 90 tablet 10  . Probiotic Product (PROBIOTIC-10 PO) Take by mouth daily.     . TURMERIC PO Take by mouth daily.    . clindamycin (CLINDAGEL) 1 % gel APPLY TO AFFECTED AREA TWICE A DAY (Patient taking differently: as needed. APPLY TO AFFECTED AREA TWICE A DAY) 30 g 0  . colchicine 0.6 MG tablet Take 1 tablet (0.6 mg total) by mouth 2 (two) times daily. Take twice daily until pain resolves and then take as needed for gout flare. (Patient taking differently: Take 0.6 mg by mouth 2 (two) times daily as needed. Take twice daily until pain resolves and then take as needed for gout flare.) 60 tablet 1  . [facility] ANTI-FUNGAL 2 % powder APPLY TO AFFECTED AREA TWICE A DAY AFTER SKIN RESOLVES FROM KETOCONAZOLE CREAM (Patient taking differently: as needed. ) 85 g 1  . doxycycline (VIBRAMYCIN) 50 MG capsule TAKE 2 CAPSULES BY MOUTH 2 (TWO) TIMES DAILY. (Patient taking differently: As directed for deer tick's (allergic reaction)) 30 capsule 10  . mupirocin ointment (BACTROBAN) 2 % APPLY TOPICALLY TO AFFECTED AREA TWICE DAILY (Patient taking differently: as needed. ) 22 g 10    Results for orders placed or performed during the hospital encounter of 08/22/19 (from the  past 48 hour(s))  I-STAT, chem 8     Status: Abnormal   Collection Time: 08/22/19  6:49 AM  Result Value Ref Range   Sodium 141 135 - 145 mmol/L   Potassium 3.6 3.5 - 5.1 mmol/L   Chloride 100 98 - 111 mmol/L   BUN 15 8 - 23 mg/dL   Creatinine, Ser 1.30 (H) 0.44 - 1.00 mg/dL   Glucose, Bld 96 70 - 99 mg/dL   Calcium, Ion 1.29 1.15 - 1.40 mmol/L   TCO2 28 22 - 32 mmol/L   Hemoglobin 13.6 12.0 - 15.0 g/dL   HCT 40.0 36.0 -  46.0 %   No results found.  Review of Systems  Constitutional: Negative for chills and fever.  HENT: Negative for hearing loss.   Eyes: Negative for blurred vision and double vision.  Respiratory: Negative for cough and hemoptysis.   Cardiovascular: Negative for chest pain and palpitations.  Gastrointestinal: Positive for abdominal pain and nausea. Negative for vomiting.  Genitourinary: Negative for dysuria and urgency.  Musculoskeletal: Negative for myalgias and neck pain.  Skin: Negative for itching and rash.  Neurological: Negative for dizziness, tingling and headaches.  Endo/Heme/Allergies: Does not bruise/bleed easily.  Psychiatric/Behavioral: Negative for depression and suicidal ideas.    Blood pressure (!) 142/99, pulse (!) 150, temperature 98.8 F (37.1 C), temperature source Oral, resp. rate 18, height 5\' 2"  (1.575 m), weight 89.8 kg, SpO2 96 %. Physical Exam  Vitals reviewed. Constitutional: She is oriented to person, place, and time. She appears well-developed and well-nourished.  HENT:  Head: Normocephalic and atraumatic.  Eyes: Pupils are equal, round, and reactive to light. Conjunctivae and EOM are normal.  Neck: Normal range of motion. Neck supple.  Cardiovascular: An irregularly irregular rhythm present. Tachycardia present.  Respiratory: Effort normal and breath sounds normal.  GI: Soft. Bowel sounds are normal. She exhibits no distension. There is no abdominal tenderness.  Musculoskeletal: Normal range of motion.  Neurological: She is  alert and oriented to person, place, and time.  Skin: Skin is warm and dry.  Psychiatric: She has a normal mood and affect. Her behavior is normal.     Assessment/Plan 78 yo female with symptoms and work up consistent with chronic calculous cholecystitis -lap chole -planned outpatient procedure -[facility] protocols  Mickeal Skinner, MD 08/22/2019, 7:19 AM

## 2019-08-23 ENCOUNTER — Encounter (HOSPITAL_BASED_OUTPATIENT_CLINIC_OR_DEPARTMENT_OTHER): Payer: Self-pay | Admitting: General Surgery

## 2019-08-23 LAB — SURGICAL PATHOLOGY

## 2019-08-28 ENCOUNTER — Inpatient Hospital Stay (HOSPITAL_COMMUNITY)
Admission: EM | Admit: 2019-08-28 | Discharge: 2019-09-17 | DRG: 682 | Disposition: A | Discharge status: Skilled Nursing Facility | Payer: Medicare Other | Attending: Internal Medicine | Admitting: Internal Medicine

## 2019-08-28 ENCOUNTER — Emergency Department (HOSPITAL_COMMUNITY): Payer: Medicare Other

## 2019-08-28 ENCOUNTER — Other Ambulatory Visit: Payer: Self-pay

## 2019-08-28 ENCOUNTER — Encounter (HOSPITAL_COMMUNITY): Payer: Self-pay

## 2019-08-28 DIAGNOSIS — R0902 Hypoxemia: Secondary | ICD-10-CM | POA: Diagnosis not present

## 2019-08-28 DIAGNOSIS — R1011 Right upper quadrant pain: Principal | ICD-10-CM

## 2019-08-28 DIAGNOSIS — Z8249 Family history of ischemic heart disease and other diseases of the circulatory system: Secondary | ICD-10-CM

## 2019-08-28 DIAGNOSIS — F4323 Adjustment disorder with mixed anxiety and depressed mood: Secondary | ICD-10-CM | POA: Diagnosis not present

## 2019-08-28 DIAGNOSIS — Z20828 Contact with and (suspected) exposure to other viral communicable diseases: Secondary | ICD-10-CM | POA: Diagnosis present

## 2019-08-28 DIAGNOSIS — J90 Pleural effusion, not elsewhere classified: Secondary | ICD-10-CM | POA: Diagnosis not present

## 2019-08-28 DIAGNOSIS — L308 Other specified dermatitis: Secondary | ICD-10-CM | POA: Diagnosis not present

## 2019-08-28 DIAGNOSIS — J189 Pneumonia, unspecified organism: Secondary | ICD-10-CM | POA: Diagnosis not present

## 2019-08-28 DIAGNOSIS — I5033 Acute on chronic diastolic (congestive) heart failure: Secondary | ICD-10-CM | POA: Diagnosis not present

## 2019-08-28 DIAGNOSIS — R188 Other ascites: Secondary | ICD-10-CM

## 2019-08-28 DIAGNOSIS — J690 Pneumonitis due to inhalation of food and vomit: Secondary | ICD-10-CM | POA: Diagnosis not present

## 2019-08-28 DIAGNOSIS — I4891 Unspecified atrial fibrillation: Secondary | ICD-10-CM | POA: Diagnosis not present

## 2019-08-28 DIAGNOSIS — F411 Generalized anxiety disorder: Secondary | ICD-10-CM

## 2019-08-28 DIAGNOSIS — Y848 Other medical procedures as the cause of abnormal reaction of the patient, or of later complication, without mention of misadventure at the time of the procedure: Secondary | ICD-10-CM | POA: Diagnosis not present

## 2019-08-28 DIAGNOSIS — R52 Pain, unspecified: Secondary | ICD-10-CM | POA: Diagnosis not present

## 2019-08-28 DIAGNOSIS — K219 Gastro-esophageal reflux disease without esophagitis: Secondary | ICD-10-CM | POA: Diagnosis present

## 2019-08-28 DIAGNOSIS — Z888 Allergy status to other drugs, medicaments and biological substances status: Secondary | ICD-10-CM

## 2019-08-28 DIAGNOSIS — K9189 Other postprocedural complications and disorders of digestive system: Secondary | ICD-10-CM | POA: Diagnosis not present

## 2019-08-28 DIAGNOSIS — N179 Acute kidney failure, unspecified: Principal | ICD-10-CM

## 2019-08-28 DIAGNOSIS — J9601 Acute respiratory failure with hypoxia: Secondary | ICD-10-CM | POA: Diagnosis not present

## 2019-08-28 DIAGNOSIS — Z841 Family history of disorders of kidney and ureter: Secondary | ICD-10-CM

## 2019-08-28 DIAGNOSIS — R601 Generalized edema: Secondary | ICD-10-CM | POA: Diagnosis not present

## 2019-08-28 DIAGNOSIS — R0602 Shortness of breath: Secondary | ICD-10-CM

## 2019-08-28 DIAGNOSIS — I119 Hypertensive heart disease without heart failure: Secondary | ICD-10-CM | POA: Diagnosis present

## 2019-08-28 DIAGNOSIS — T8189XA Other complications of procedures, not elsewhere classified, initial encounter: Secondary | ICD-10-CM

## 2019-08-28 DIAGNOSIS — K839 Disease of biliary tract, unspecified: Secondary | ICD-10-CM | POA: Diagnosis not present

## 2019-08-28 DIAGNOSIS — Z9842 Cataract extraction status, left eye: Secondary | ICD-10-CM

## 2019-08-28 DIAGNOSIS — I4821 Permanent atrial fibrillation: Secondary | ICD-10-CM | POA: Diagnosis present

## 2019-08-28 DIAGNOSIS — R000 Tachycardia, unspecified: Secondary | ICD-10-CM | POA: Diagnosis not present

## 2019-08-28 DIAGNOSIS — Z87440 Personal history of urinary (tract) infections: Secondary | ICD-10-CM

## 2019-08-28 DIAGNOSIS — Z8041 Family history of malignant neoplasm of ovary: Secondary | ICD-10-CM

## 2019-08-28 DIAGNOSIS — I872 Venous insufficiency (chronic) (peripheral): Secondary | ICD-10-CM | POA: Diagnosis not present

## 2019-08-28 DIAGNOSIS — Z961 Presence of intraocular lens: Secondary | ICD-10-CM | POA: Diagnosis present

## 2019-08-28 DIAGNOSIS — E876 Hypokalemia: Secondary | ICD-10-CM | POA: Diagnosis not present

## 2019-08-28 DIAGNOSIS — K5900 Constipation, unspecified: Secondary | ICD-10-CM | POA: Diagnosis not present

## 2019-08-28 DIAGNOSIS — Z9841 Cataract extraction status, right eye: Secondary | ICD-10-CM

## 2019-08-28 DIAGNOSIS — N393 Stress incontinence (female) (male): Secondary | ICD-10-CM | POA: Diagnosis not present

## 2019-08-28 DIAGNOSIS — R159 Full incontinence of feces: Secondary | ICD-10-CM | POA: Diagnosis not present

## 2019-08-28 DIAGNOSIS — G8918 Other acute postprocedural pain: Secondary | ICD-10-CM | POA: Diagnosis not present

## 2019-08-28 DIAGNOSIS — K859 Acute pancreatitis without necrosis or infection, unspecified: Secondary | ICD-10-CM | POA: Diagnosis not present

## 2019-08-28 DIAGNOSIS — K8042 Calculus of bile duct with acute cholecystitis without obstruction: Secondary | ICD-10-CM | POA: Diagnosis not present

## 2019-08-28 DIAGNOSIS — E860 Dehydration: Secondary | ICD-10-CM | POA: Diagnosis not present

## 2019-08-28 DIAGNOSIS — K838 Other specified diseases of biliary tract: Secondary | ICD-10-CM | POA: Diagnosis not present

## 2019-08-28 DIAGNOSIS — Z79899 Other long term (current) drug therapy: Secondary | ICD-10-CM

## 2019-08-28 DIAGNOSIS — R0600 Dyspnea, unspecified: Secondary | ICD-10-CM

## 2019-08-28 DIAGNOSIS — I130 Hypertensive heart and chronic kidney disease with heart failure and stage 1 through stage 4 chronic kidney disease, or unspecified chronic kidney disease: Secondary | ICD-10-CM | POA: Diagnosis not present

## 2019-08-28 DIAGNOSIS — Z791 Long term (current) use of non-steroidal anti-inflammatories (NSAID): Secondary | ICD-10-CM

## 2019-08-28 DIAGNOSIS — R109 Unspecified abdominal pain: Secondary | ICD-10-CM | POA: Diagnosis not present

## 2019-08-28 DIAGNOSIS — D649 Anemia, unspecified: Secondary | ICD-10-CM | POA: Diagnosis not present

## 2019-08-28 DIAGNOSIS — Z9049 Acquired absence of other specified parts of digestive tract: Secondary | ICD-10-CM

## 2019-08-28 DIAGNOSIS — F418 Other specified anxiety disorders: Secondary | ICD-10-CM | POA: Diagnosis not present

## 2019-08-28 DIAGNOSIS — K668 Other specified disorders of peritoneum: Secondary | ICD-10-CM

## 2019-08-28 DIAGNOSIS — Z811 Family history of alcohol abuse and dependence: Secondary | ICD-10-CM

## 2019-08-28 DIAGNOSIS — K5730 Diverticulosis of large intestine without perforation or abscess without bleeding: Secondary | ICD-10-CM | POA: Diagnosis not present

## 2019-08-28 DIAGNOSIS — N1832 Chronic kidney disease, stage 3b: Secondary | ICD-10-CM | POA: Diagnosis not present

## 2019-08-28 DIAGNOSIS — Z7901 Long term (current) use of anticoagulants: Secondary | ICD-10-CM

## 2019-08-28 DIAGNOSIS — Z801 Family history of malignant neoplasm of trachea, bronchus and lung: Secondary | ICD-10-CM

## 2019-08-28 DIAGNOSIS — Z6839 Body mass index (BMI) 39.0-39.9, adult: Secondary | ICD-10-CM

## 2019-08-28 DIAGNOSIS — Y95 Nosocomial condition: Secondary | ICD-10-CM | POA: Diagnosis not present

## 2019-08-28 DIAGNOSIS — I110 Hypertensive heart disease with heart failure: Secondary | ICD-10-CM | POA: Diagnosis not present

## 2019-08-28 DIAGNOSIS — E669 Obesity, unspecified: Secondary | ICD-10-CM | POA: Diagnosis present

## 2019-08-28 DIAGNOSIS — R509 Fever, unspecified: Secondary | ICD-10-CM

## 2019-08-28 LAB — COMPREHENSIVE METABOLIC PANEL
ALT: 16 U/L (ref 0–44)
AST: 25 U/L (ref 15–41)
Albumin: 3.6 g/dL (ref 3.5–5.0)
Alkaline Phosphatase: 80 U/L (ref 38–126)
Anion gap: 14 (ref 5–15)
BUN: 46 mg/dL — ABNORMAL HIGH (ref 8–23)
CO2: 26 mmol/L (ref 22–32)
Calcium: 10.4 mg/dL — ABNORMAL HIGH (ref 8.9–10.3)
Chloride: 100 mmol/L (ref 98–111)
Creatinine, Ser: 3.79 mg/dL — ABNORMAL HIGH (ref 0.44–1.00)
GFR calc Af Amer: 12 mL/min — ABNORMAL LOW (ref >60)
GFR calc non Af Amer: 11 mL/min — ABNORMAL LOW (ref >60)
Glucose, Bld: 118 mg/dL — ABNORMAL HIGH (ref 70–99)
Potassium: 4.8 mmol/L (ref 3.5–5.1)
Sodium: 140 mmol/L (ref 135–145)
Total Bilirubin: 1.1 mg/dL (ref 0.3–1.2)
Total Protein: 7.8 g/dL (ref 6.5–8.1)

## 2019-08-28 LAB — URINALYSIS, ROUTINE W REFLEX MICROSCOPIC
Bilirubin Urine: NEGATIVE
Glucose, UA: NEGATIVE mg/dL
Hgb urine dipstick: NEGATIVE
Ketones, ur: NEGATIVE mg/dL
Leukocytes,Ua: NEGATIVE
Nitrite: NEGATIVE
Protein, ur: NEGATIVE mg/dL
Specific Gravity, Urine: 1.009 (ref 1.005–1.030)
pH: 6 (ref 5.0–8.0)

## 2019-08-28 LAB — CBC
HCT: 35.9 % — ABNORMAL LOW (ref 36.0–46.0)
Hemoglobin: 11.9 g/dL — ABNORMAL LOW (ref 12.0–15.0)
MCH: 31.6 pg (ref 26.0–34.0)
MCHC: 33.1 g/dL (ref 30.0–36.0)
MCV: 95.2 fL (ref 80.0–100.0)
Platelets: 301 10*3/uL (ref 150–400)
RBC: 3.77 MIL/uL — ABNORMAL LOW (ref 3.87–5.11)
RDW: 13.3 % (ref 11.5–15.5)
WBC: 12.1 10*3/uL — ABNORMAL HIGH (ref 4.0–10.5)
nRBC: 0 % (ref 0.0–0.2)

## 2019-08-28 LAB — LIPASE, BLOOD: Lipase: 42 U/L (ref 11–51)

## 2019-08-28 LAB — BRAIN NATRIURETIC PEPTIDE: B Natriuretic Peptide: 310.5 pg/mL — ABNORMAL HIGH (ref 0.0–100.0)

## 2019-08-28 MED ORDER — HYDROMORPHONE HCL 1 MG/ML IJ SOLN
1.0000 mg | Freq: Once | INTRAMUSCULAR | Status: AC
  Administered 2019-08-28: 1 mg via INTRAVENOUS
  Filled 2019-08-28: qty 1

## 2019-08-28 MED ORDER — SODIUM CHLORIDE 0.9 % IV SOLN
INTRAVENOUS | Status: DC
  Administered 2019-08-28: 22:00:00 via INTRAVENOUS

## 2019-08-28 MED ORDER — ONDANSETRON HCL 4 MG/2ML IJ SOLN
4.0000 mg | Freq: Once | INTRAMUSCULAR | Status: AC
  Administered 2019-08-28: 22:00:00 4 mg via INTRAVENOUS
  Filled 2019-08-28: qty 2

## 2019-08-28 MED ORDER — SODIUM CHLORIDE 0.9 % IV BOLUS
500.0000 mL | Freq: Once | INTRAVENOUS | Status: AC
  Administered 2019-08-28: 22:00:00 500 mL via INTRAVENOUS

## 2019-08-28 MED ORDER — SODIUM CHLORIDE 0.9% FLUSH
3.0000 mL | Freq: Once | INTRAVENOUS | Status: AC
  Administered 2019-08-28: 22:00:00 3 mL via INTRAVENOUS

## 2019-08-28 MED ORDER — IOHEXOL 300 MG/ML  SOLN
30.0000 mL | Freq: Once | INTRAMUSCULAR | Status: AC | PRN
  Administered 2019-08-28: 30 mL via ORAL

## 2019-08-28 NOTE — ED Notes (Signed)
Patient yelling from room to be readjusted in the bed. Patient head of bed lowered per request. Patient reminded to use call bell for assistance. EDP at bedside.

## 2019-08-28 NOTE — ED Notes (Signed)
Patient states that her stomach is hurting following her gallbladder surgery because she ran out of her norco today. Patient states that her stomach has not been hurting prior to running out of medication. Patient currently has medication waiting for her at [facility] and is having someone get it for her. Patient states that her abdominal pain has limited her mobility today. Patient assisted with with female urinal and provided peri care for herself. Urine sample sent to lab with urine culture.

## 2019-08-28 NOTE — ED Provider Notes (Signed)
[facility] DEPT Provider Note   CSN: [id number] Arrival date & time: 08/28/19  1624     History   Chief Complaint Chief Complaint  Patient presents with  . Medication Refill  . Abdominal Pain    HPI Kristen [name] is a 78 y.o. female.     Patient status post gallbladder removal on October 15 by Dr. Alvino Blood.  Patient ran out of her hydrocodone today.  Patient with increased pain in the right upper quadrant radiated to the top of her shoulder and directly to her back.  Contacted her surgeon they eventually did call in a prescription for her to pick up of hydrocodone.  Patient was not aware that it been called in she had already called ambulance to come here.  Because she could not tolerate the pain.  Past medical history is significant for anxiety as well as diastolic congestive heart failure chronic.  She denies any fevers or nausea or vomiting.     Past Medical History:  Diagnosis Date  . Anticoagulant long-term use    elquis -- managed by cardiology  . Anxiety   . Chronic calculous cholecystitis   . Chronic venous insufficiency    w/  varicose veins  . Depression   . Diastolic CHF, chronic ([place])    followed by cardiology  . Diverticulosis of colon   . Edema of both lower extremities    per pt mostly in summer time  . Essential hypertension, benign   . Fibrocystic breast disease   . Full dentures   . GERD (gastroesophageal reflux disease)    occasional tums and does not eat prior to bedtime  . Gout    08-19-2019  per pt last episode 07/ 2020  . Hiatal hernia   . History of diverticulitis 01/22/2016  . History of recurrent UTIs   . OA (osteoarthritis)    knees, lower back  . Permanent atrial fibrillation [facility]) cardiologist--- dr Agustin Cree   first dx 10/ 2011--- histroy DCCV 11-25-2010 by dr Wynonia Lawman (pt's previous cardiologist) and Cardiac cath 12-17-2010 no significant disease  . Scoliosis   . Stress incontinence in female      Patient Active Problem List   Diagnosis Date Noted  . Varicose veins of leg with edema, right 05/21/2019  . Ankle swelling, right 05/21/2019  . Bunion of great toe of right foot 05/21/2019  . Osteoarthritis 03/13/2018  . Gait abnormality 03/13/2018  . Skin lesion 02/13/2018  . 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 Abnormal Pap smear of cervix 02/08/2016  . Chronic kidney disease 02/08/2016  . Urinary frequency 02/08/2016  . History of diverticulitis 01/22/2016  . Anxiety state 10/17/2014  . Long-term (current) use of anticoagulants 06/10/2014  . Chronic venous insufficiency 06/10/2014  . Obesity (BMI 30-39.9)   . Chronic diastolic heart failure ([place])   . [place] intertrigo 06/09/2014  . Nocturnal leg cramps 04/13/2012  . Insomnia 01/10/2011  . Permanent atrial fibrillation ([place])   . OSTEOPENIA 01/31/2008  . Hypertensive heart disease     Past Surgical History:  Procedure Laterality Date  . BLEPHAROPLASTY Bilateral 02-22-2010  dr Georgia Lopes   upper eyelid's  . BUNIONECTOMY Right 2003  . CATARACT EXTRACTION W/ INTRAOCULAR LENS  IMPLANT, BILATERAL  2018  . CHOLECYSTECTOMY N/A 08/22/2019   Procedure: LAPAROSCOPIC CHOLECYSTECTOMY;  Surgeon: [name], [name], MD;  [facility];  Service: General;  Laterality: N/A;  . KNEE ARTHROSCOPY Left 2002  . TUBAL LIGATION Bilateral 1980  AND RIGHT BREAST EXCISION CYST (BENIGN)     OB History    Gravida  2   Para      Term      Preterm      AB      Living        SAB      TAB      Ectopic      Multiple      Live Births               Home Medications    Prior to Admission medications   Medication Sig Start Date End Date Taking? Authorizing Provider  alendronate (FOSAMAX) 10 MG tablet TAKE (1) TABLET BY MOUTH PER WEEK TAKE ON EMPTY STOMACH WITH FULL GLASS OF WATER Patient taking differently: Take 10 mg by mouth daily before breakfast. TAKE (1) TABLET BY  MOUTH PER WEEK TAKE ON EMPTY STOMACH WITH FULL GLASS OF WATER 05/02/19   [name], MD  allopurinol (ZYLOPRIM) 100 MG tablet TAKE 1 TABLET BY MOUTH EVERY DAY Patient taking differently: No sig reported 06/24/19   [name], MD  calcium carbonate (OSCAL) 1500 (600 Ca) MG TABS tablet Take by mouth daily.    [provider]  calcium carbonate (TUMS - DOSED IN MG ELEMENTAL CALCIUM) 500 MG chewable tablet Chew 1 tablet by mouth as needed for indigestion or heartburn.    [provider]  cholecalciferol (VITAMIN D3) 25 MCG (1000 UT) tablet Take 1,000 Units by mouth daily.    [provider]  CINNAMON PO Take by mouth daily.    [provider]  clindamycin (CLINDAGEL) 1 % gel APPLY TO AFFECTED AREA TWICE A DAY Patient taking differently: as needed. APPLY TO AFFECTED AREA TWICE A DAY 04/13/18   [name], MD  colchicine 0.6 MG tablet Take 1 tablet (0.6 mg total) by mouth 2 (two) times daily. Take twice daily until pain resolves and then take as needed for gout flare. Patient taking differently: Take 0.6 mg by mouth 2 (two) times daily as needed. Take twice daily until pain resolves and then take as needed for gout flare. 06/15/18   [name], MD  [employer] ANTI-FUNGAL 2 % powder APPLY TO AFFECTED AREA TWICE A DAY AFTER SKIN RESOLVES FROM KETOCONAZOLE CREAM Patient taking differently: as needed.  10/17/14   [name], MD  diazepam (VALIUM) 5 MG tablet TAKE 1/2 TABLET (2.5 MG TOTAL) BY MOUTH AT BEDTIME. Patient taking differently: Take 2.5 mg by mouth at bedtime.  07/08/19   [name], MD  doxycycline (VIBRAMYCIN) 50 MG capsule TAKE 2 CAPSULES BY MOUTH 2 (TWO) TIMES DAILY. Patient taking differently: As directed for deer tick's (allergic reaction) 04/25/18   [name], MD  ELIQUIS 5 MG TABS tablet TAKE 1 TABLET BY MOUTH TWICE A DAY Patient taking differently: Take 5 mg by mouth 2 (two) times daily.  05/23/19   Park Liter, MD  Flax  Oil-Fish Oil-Borage Oil (FISH-FLAX-BORAGE) CAPS Take 1 capsule by mouth daily.    [provider]  furosemide (LASIX) 20 MG tablet Take 1 tablet (20 mg total) by mouth daily. Patient taking differently: Take 20 mg by mouth daily.  08/19/19   Park Liter, MD  glucosamine-chondroitin (GLUCOSAMINE-CHONDROITIN DS) 500-400 MG tablet Take 1 tablet by mouth daily.     [provider]  HYDROcodone-acetaminophen (NORCO/VICODIN) 5-325 MG tablet Take 1 tablet by mouth every 6 (six) hours as needed for moderate pain. 08/22/19  [name], [name], MD  ibuprofen (ADVIL) 800 MG tablet Take 1 tablet (800 mg total) by mouth every 8 (eight) hours as needed. 08/22/19   [name], [name], MD  lisinopril-hydrochlorothiazide (ZESTORETIC) 20-25 MG tablet Take 1 tablet by mouth daily. Patient taking differently: Take 1 tablet by mouth daily.  03/08/19   [name], MD  loratadine (CLARITIN) 10 MG tablet Take 1 tablet (10 mg total) by mouth daily. 07/08/19   [name], MD  metoprolol succinate (TOPROL-XL) 100 MG 24 hr tablet Take 1 tablet (100 mg total) by mouth daily. Take with or immediately following a meal. Patient taking differently: Take 100 mg by mouth daily. Take with or immediately following a meal. 01/29/19 08/19/19  Park Liter, MD  Multiple Vitamins-Minerals (EMERGEN-C IMMUNE) PACK Take by mouth daily.    [provider]  Multiple Vitamins-Minerals (MULTIVITAMIN WOMEN 50+) TABS Take by mouth daily.    [provider]  mupirocin ointment (BACTROBAN) 2 % APPLY TOPICALLY TO AFFECTED AREA TWICE DAILY Patient taking differently: as needed.  02/20/19   [name], MD  Nutritional Supplements (ECHINACEA/GOLDEN SEAL PO) Take by mouth daily.    [provider]  polycarbophil (FIBERCON) 625 MG tablet Take 625 mg by mouth daily.    [provider]  potassium chloride (KLOR-CON) 20 MEQ packet MIX 1 PACKET IN LIQUID AND TAKE BY MOUTH  DAILY AS DIRECTED Patient taking differently: Take 20 mEq by mouth daily. MIX 1 PACKET IN LIQUID AND TAKE BY MOUTH DAILY AS DIRECTED 02/20/19   [name], MD  Probiotic Product (PROBIOTIC-10 PO) Take by mouth daily.     [provider]  TURMERIC PO Take by mouth daily.    [provider]    Family History Family History  Problem Relation Age of Onset  . Cancer Mother        ovarian  . Kidney disease Father   . Alcohol abuse Father   . Cancer Maternal Aunt        lung- smoker  . Heart disease Maternal Uncle     Social History Social History   Tobacco Use  . Smoking status: Never Smoker  . Smokeless tobacco: Never Used  Substance Use Topics  . Alcohol use: Yes    Comment: rarely  . Drug use: Never     Allergies   Flecainide acetate   Review of Systems Review of Systems  Constitutional: Negative for chills and fever.  HENT: Negative for congestion, rhinorrhea and sore throat.   Eyes: Negative for visual disturbance.  Respiratory: Negative for cough and shortness of breath.   Cardiovascular: Positive for leg swelling. Negative for chest pain.  Gastrointestinal: Positive for abdominal pain. Negative for diarrhea, nausea and vomiting.  Genitourinary: Negative for dysuria.  Musculoskeletal: Positive for back pain. Negative for neck pain.  Skin: Negative for rash.  Neurological: Negative for dizziness, light-headedness and headaches.  Hematological: Does not bruise/bleed easily.  Psychiatric/Behavioral: Negative for confusion.     Physical Exam Updated Vital Signs BP 120/85 (BP Location: Left Arm)   Pulse 96   Temp 98.3 F (36.8 C) (Oral)   Resp 18   Ht 1.575 m (5\' 2" )   Wt 88 kg   SpO2 97%   BMI 35.48 kg/m   Physical Exam Vitals signs and nursing note reviewed.  Constitutional:      General: She is not in acute distress.    Appearance: Normal appearance. She is well-developed.  HENT:     Head: Normocephalic  and atraumatic.  Eyes:      Extraocular Movements: Extraocular movements intact.     Conjunctiva/sclera: Conjunctivae normal.     Pupils: Pupils are equal, round, and reactive to light.  Neck:     Musculoskeletal: Normal range of motion and neck supple.  Cardiovascular:     Rate and Rhythm: Normal rate and regular rhythm.     Heart sounds: No murmur.  Pulmonary:     Effort: Pulmonary effort is normal. No respiratory distress.     Breath sounds: Normal breath sounds. No rales.  Abdominal:     Palpations: Abdomen is soft.     Tenderness: There is no abdominal tenderness.     Comments: Abdominal wounds all healing well no evidence of any infection.  Tenderness to some palpation right upper quadrant of the abdomen but no guarding.  Musculoskeletal:        General: Swelling present.  Skin:    General: Skin is warm and dry.  Neurological:     General: No focal deficit present.     Mental Status: She is alert and oriented to person, place, and time.      ED Treatments / Results  Labs (all labs ordered are listed, but only abnormal results are displayed) Labs Reviewed  COMPREHENSIVE METABOLIC PANEL - Abnormal; Notable for the following components:      Result Value   Glucose, Bld 118 (*)    BUN 46 (*)    Creatinine, Ser 3.79 (*)    Calcium 10.4 (*)    GFR calc non Af Amer 11 (*)    GFR calc Af Amer 12 (*)    All other components within normal limits  CBC - Abnormal; Notable for the following components:   WBC 12.1 (*)    RBC 3.77 (*)    Hemoglobin 11.9 (*)    HCT 35.9 (*)    All other components within normal limits  URINALYSIS, ROUTINE W REFLEX MICROSCOPIC - Abnormal; Notable for the following components:   Color, Urine STRAW (*)    All other components within normal limits  BRAIN NATRIURETIC PEPTIDE - Abnormal; Notable for the following components:   B Natriuretic Peptide 310.5 (*)    All other components within normal limits  SARS CORONAVIRUS 2 (TAT 6-24 HRS)  LIPASE, BLOOD     EKG None  Radiology Dg Chest Port 1 View  Result Date: 08/28/2019 CLINICAL DATA:  CHF, shortness of breath EXAM: PORTABLE CHEST 1 VIEW COMPARISON:  None. FINDINGS: There is mild cardiomegaly. Pulmonary vascular congestion is seen. There is mildly increased interstitial markings throughout both lungs. There is blunting of the left costophrenic angle which could be due to a trace left pleural effusion. No acute osseous abnormality. IMPRESSION: Mild cardiomegaly and interstitial edema. Probable trace left pleural effusion. Electronically Signed   By: Prudencio Pair M.D.   On: 08/28/2019 22:34    Procedures Procedures (including critical care time)  Medications Ordered in ED Medications  0.9 %  sodium chloride infusion ( Intravenous New Bag/Given 08/28/19 2223)  sodium chloride flush (NS) 0.9 % injection 3 mL (3 mLs Intravenous Given 08/28/19 2225)  sodium chloride 0.9 % bolus 500 mL (0 mLs Intravenous Stopped 08/28/19 2256)  ondansetron (ZOFRAN) injection 4 mg (4 mg Intravenous Given 08/28/19 2224)  HYDROmorphone (DILAUDID) injection 1 mg (1 mg Intravenous Given 08/28/19 2225)  iohexol (OMNIPAQUE) 300 MG/ML solution 30 mL (30 mLs Oral Contrast Given 08/28/19 2305)     Initial Impression / Assessment and Plan /  ED Course  I have reviewed the triage vital signs and the nursing notes.  Pertinent labs & imaging results that were available during my care of the patient were reviewed by me and considered in my medical decision making (see chart for details).        Patient had increased pain right upper quadrant that radiated to the shoulder and posteriorly behind the right upper quadrant.  Patient status post gallbladder removal on 15 October.  Ran out of her hydrocodone yesterday and today had severe pain in that area.  The cholecystectomy was elective done by [facility].  CT scan of the abdomen ordered to rule out any postoperative complications in the area.  Patient with a slight  increase in her white blood cell count.  Patient BMP also in 300 range.  Significant change in her kidney function which was normal and now has a creatinine of 3 with marked decrease in GFR.  Patient therefore will need CT scan without contrast.  Chest x-ray raise some concerns for interstitial edema.  CT scan will get better cuts in the lower part of the lungs as well.  Discussed with overnight emergency physician colleague Dr. Nicholes Stairs.  He will follow up on the CT scan.  May require consultation with general surgery depending on the results.  Patient will require admission for the acute kidney injury.  Patient also could have early pleural effusion or CHF.    Final Clinical Impressions(s) / ED Diagnoses   Final diagnoses:  Right upper quadrant abdominal pain  AKI (acute kidney injury) [facility])    ED Discharge Orders    None       Fredia Sorrow, MD 08/28/19 2342

## 2019-08-28 NOTE — ED Notes (Signed)
SpO2 dropped to 85% on RA after medication administration. Patient placed on 2 L of O2 via [place] with an improvement to 94%. EDP made aware.

## 2019-08-29 ENCOUNTER — Emergency Department (HOSPITAL_COMMUNITY): Payer: Medicare Other

## 2019-08-29 ENCOUNTER — Inpatient Hospital Stay (HOSPITAL_COMMUNITY): Payer: Medicare Other

## 2019-08-29 ENCOUNTER — Other Ambulatory Visit: Payer: Self-pay

## 2019-08-29 ENCOUNTER — Encounter (HOSPITAL_COMMUNITY): Payer: Self-pay | Admitting: Family Medicine

## 2019-08-29 DIAGNOSIS — R188 Other ascites: Secondary | ICD-10-CM | POA: Diagnosis not present

## 2019-08-29 DIAGNOSIS — K839 Disease of biliary tract, unspecified: Secondary | ICD-10-CM | POA: Diagnosis not present

## 2019-08-29 DIAGNOSIS — I10 Essential (primary) hypertension: Secondary | ICD-10-CM | POA: Diagnosis not present

## 2019-08-29 DIAGNOSIS — K810 Acute cholecystitis: Secondary | ICD-10-CM | POA: Diagnosis not present

## 2019-08-29 DIAGNOSIS — F418 Other specified anxiety disorders: Secondary | ICD-10-CM | POA: Diagnosis not present

## 2019-08-29 DIAGNOSIS — A499 Bacterial infection, unspecified: Secondary | ICD-10-CM | POA: Diagnosis not present

## 2019-08-29 DIAGNOSIS — L308 Other specified dermatitis: Secondary | ICD-10-CM | POA: Diagnosis present

## 2019-08-29 DIAGNOSIS — I5033 Acute on chronic diastolic (congestive) heart failure: Secondary | ICD-10-CM

## 2019-08-29 DIAGNOSIS — K832 Perforation of bile duct: Secondary | ICD-10-CM | POA: Diagnosis not present

## 2019-08-29 DIAGNOSIS — R933 Abnormal findings on diagnostic imaging of other parts of digestive tract: Secondary | ICD-10-CM | POA: Diagnosis not present

## 2019-08-29 DIAGNOSIS — M109 Gout, unspecified: Secondary | ICD-10-CM | POA: Diagnosis not present

## 2019-08-29 DIAGNOSIS — N179 Acute kidney failure, unspecified: Principal | ICD-10-CM

## 2019-08-29 DIAGNOSIS — I4891 Unspecified atrial fibrillation: Secondary | ICD-10-CM | POA: Diagnosis present

## 2019-08-29 DIAGNOSIS — Z743 Need for continuous supervision: Secondary | ICD-10-CM | POA: Diagnosis not present

## 2019-08-29 DIAGNOSIS — K838 Other specified diseases of biliary tract: Secondary | ICD-10-CM | POA: Diagnosis not present

## 2019-08-29 DIAGNOSIS — R0602 Shortness of breath: Secondary | ICD-10-CM

## 2019-08-29 DIAGNOSIS — K5730 Diverticulosis of large intestine without perforation or abscess without bleeding: Secondary | ICD-10-CM | POA: Diagnosis not present

## 2019-08-29 DIAGNOSIS — I110 Hypertensive heart disease with heart failure: Secondary | ICD-10-CM

## 2019-08-29 DIAGNOSIS — I130 Hypertensive heart and chronic kidney disease with heart failure and stage 1 through stage 4 chronic kidney disease, or unspecified chronic kidney disease: Secondary | ICD-10-CM | POA: Diagnosis not present

## 2019-08-29 DIAGNOSIS — R279 Unspecified lack of coordination: Secondary | ICD-10-CM | POA: Diagnosis not present

## 2019-08-29 DIAGNOSIS — Y95 Nosocomial condition: Secondary | ICD-10-CM | POA: Diagnosis not present

## 2019-08-29 DIAGNOSIS — J90 Pleural effusion, not elsewhere classified: Secondary | ICD-10-CM | POA: Diagnosis not present

## 2019-08-29 DIAGNOSIS — R12 Heartburn: Secondary | ICD-10-CM | POA: Diagnosis not present

## 2019-08-29 DIAGNOSIS — D649 Anemia, unspecified: Secondary | ICD-10-CM | POA: Diagnosis present

## 2019-08-29 DIAGNOSIS — K9189 Other postprocedural complications and disorders of digestive system: Secondary | ICD-10-CM | POA: Diagnosis not present

## 2019-08-29 DIAGNOSIS — R0600 Dyspnea, unspecified: Secondary | ICD-10-CM | POA: Diagnosis not present

## 2019-08-29 DIAGNOSIS — R509 Fever, unspecified: Secondary | ICD-10-CM | POA: Diagnosis not present

## 2019-08-29 DIAGNOSIS — R1011 Right upper quadrant pain: Secondary | ICD-10-CM | POA: Diagnosis not present

## 2019-08-29 DIAGNOSIS — Z8719 Personal history of other diseases of the digestive system: Secondary | ICD-10-CM | POA: Diagnosis not present

## 2019-08-29 DIAGNOSIS — K831 Obstruction of bile duct: Secondary | ICD-10-CM | POA: Diagnosis not present

## 2019-08-29 DIAGNOSIS — J9601 Acute respiratory failure with hypoxia: Secondary | ICD-10-CM | POA: Diagnosis not present

## 2019-08-29 DIAGNOSIS — N393 Stress incontinence (female) (male): Secondary | ICD-10-CM | POA: Diagnosis not present

## 2019-08-29 DIAGNOSIS — Y848 Other medical procedures as the cause of abnormal reaction of the patient, or of later complication, without mention of misadventure at the time of the procedure: Secondary | ICD-10-CM | POA: Diagnosis not present

## 2019-08-29 DIAGNOSIS — I4821 Permanent atrial fibrillation: Secondary | ICD-10-CM

## 2019-08-29 DIAGNOSIS — I119 Hypertensive heart disease without heart failure: Secondary | ICD-10-CM | POA: Diagnosis not present

## 2019-08-29 DIAGNOSIS — K859 Acute pancreatitis without necrosis or infection, unspecified: Secondary | ICD-10-CM | POA: Diagnosis not present

## 2019-08-29 DIAGNOSIS — R159 Full incontinence of feces: Secondary | ICD-10-CM | POA: Diagnosis not present

## 2019-08-29 DIAGNOSIS — K8050 Calculus of bile duct without cholangitis or cholecystitis without obstruction: Secondary | ICD-10-CM | POA: Diagnosis not present

## 2019-08-29 DIAGNOSIS — K8042 Calculus of bile duct with acute cholecystitis without obstruction: Secondary | ICD-10-CM | POA: Diagnosis present

## 2019-08-29 DIAGNOSIS — Z6839 Body mass index (BMI) 39.0-39.9, adult: Secondary | ICD-10-CM | POA: Diagnosis not present

## 2019-08-29 DIAGNOSIS — K668 Other specified disorders of peritoneum: Secondary | ICD-10-CM | POA: Diagnosis not present

## 2019-08-29 DIAGNOSIS — Z7901 Long term (current) use of anticoagulants: Secondary | ICD-10-CM | POA: Diagnosis not present

## 2019-08-29 DIAGNOSIS — E669 Obesity, unspecified: Secondary | ICD-10-CM | POA: Diagnosis present

## 2019-08-29 DIAGNOSIS — K5900 Constipation, unspecified: Secondary | ICD-10-CM | POA: Diagnosis not present

## 2019-08-29 DIAGNOSIS — R109 Unspecified abdominal pain: Secondary | ICD-10-CM | POA: Diagnosis not present

## 2019-08-29 DIAGNOSIS — T8143XA Infection following a procedure, organ and space surgical site, initial encounter: Secondary | ICD-10-CM | POA: Diagnosis not present

## 2019-08-29 DIAGNOSIS — J690 Pneumonitis due to inhalation of food and vomit: Secondary | ICD-10-CM | POA: Diagnosis not present

## 2019-08-29 DIAGNOSIS — E876 Hypokalemia: Secondary | ICD-10-CM | POA: Diagnosis not present

## 2019-08-29 DIAGNOSIS — N189 Chronic kidney disease, unspecified: Secondary | ICD-10-CM | POA: Diagnosis not present

## 2019-08-29 DIAGNOSIS — Z438 Encounter for attention to other artificial openings: Secondary | ICD-10-CM | POA: Diagnosis not present

## 2019-08-29 DIAGNOSIS — N1832 Chronic kidney disease, stage 3b: Secondary | ICD-10-CM | POA: Diagnosis present

## 2019-08-29 DIAGNOSIS — Z20828 Contact with and (suspected) exposure to other viral communicable diseases: Secondary | ICD-10-CM | POA: Diagnosis present

## 2019-08-29 DIAGNOSIS — I872 Venous insufficiency (chronic) (peripheral): Secondary | ICD-10-CM | POA: Diagnosis present

## 2019-08-29 DIAGNOSIS — K30 Functional dyspepsia: Secondary | ICD-10-CM | POA: Diagnosis not present

## 2019-08-29 DIAGNOSIS — G8918 Other acute postprocedural pain: Secondary | ICD-10-CM | POA: Diagnosis present

## 2019-08-29 DIAGNOSIS — E860 Dehydration: Secondary | ICD-10-CM | POA: Diagnosis present

## 2019-08-29 LAB — CBC WITH DIFFERENTIAL/PLATELET
Abs Immature Granulocytes: 0.05 10*3/uL (ref 0.00–0.07)
Basophils Absolute: 0 10*3/uL (ref 0.0–0.1)
Basophils Relative: 0 %
Eosinophils Absolute: 0.7 10*3/uL — ABNORMAL HIGH (ref 0.0–0.5)
Eosinophils Relative: 7 %
HCT: 34.1 % — ABNORMAL LOW (ref 36.0–46.0)
Hemoglobin: 11.1 g/dL — ABNORMAL LOW (ref 12.0–15.0)
Immature Granulocytes: 1 %
Lymphocytes Relative: 14 %
Lymphs Abs: 1.5 10*3/uL (ref 0.7–4.0)
MCH: 31.3 pg (ref 26.0–34.0)
MCHC: 32.6 g/dL (ref 30.0–36.0)
MCV: 96.1 fL (ref 80.0–100.0)
Monocytes Absolute: 0.9 10*3/uL (ref 0.1–1.0)
Monocytes Relative: 9 %
Neutro Abs: 7.2 10*3/uL (ref 1.7–7.7)
Neutrophils Relative %: 69 %
Platelets: 278 10*3/uL (ref 150–400)
RBC: 3.55 MIL/uL — ABNORMAL LOW (ref 3.87–5.11)
RDW: 13.4 % (ref 11.5–15.5)
WBC: 10.3 10*3/uL (ref 4.0–10.5)
nRBC: 0 % (ref 0.0–0.2)

## 2019-08-29 LAB — COMPREHENSIVE METABOLIC PANEL
ALT: 15 U/L (ref 0–44)
AST: 22 U/L (ref 15–41)
Albumin: 3.2 g/dL — ABNORMAL LOW (ref 3.5–5.0)
Alkaline Phosphatase: 78 U/L (ref 38–126)
Anion gap: 12 (ref 5–15)
BUN: 40 mg/dL — ABNORMAL HIGH (ref 8–23)
CO2: 24 mmol/L (ref 22–32)
Calcium: 9.2 mg/dL (ref 8.9–10.3)
Chloride: 99 mmol/L (ref 98–111)
Creatinine, Ser: 3.33 mg/dL — ABNORMAL HIGH (ref 0.44–1.00)
GFR calc Af Amer: 14 mL/min — ABNORMAL LOW (ref >60)
GFR calc non Af Amer: 13 mL/min — ABNORMAL LOW (ref >60)
Glucose, Bld: 105 mg/dL — ABNORMAL HIGH (ref 70–99)
Potassium: 3.9 mmol/L (ref 3.5–5.1)
Sodium: 135 mmol/L (ref 135–145)
Total Bilirubin: 1.4 mg/dL — ABNORMAL HIGH (ref 0.3–1.2)
Total Protein: 6.8 g/dL (ref 6.5–8.1)

## 2019-08-29 LAB — BASIC METABOLIC PANEL
Anion gap: 13 (ref 5–15)
BUN: 36 mg/dL — ABNORMAL HIGH (ref 8–23)
CO2: 28 mmol/L (ref 22–32)
Calcium: 9.5 mg/dL (ref 8.9–10.3)
Chloride: 97 mmol/L — ABNORMAL LOW (ref 98–111)
Creatinine, Ser: 3.01 mg/dL — ABNORMAL HIGH (ref 0.44–1.00)
GFR calc Af Amer: 16 mL/min — ABNORMAL LOW (ref >60)
GFR calc non Af Amer: 14 mL/min — ABNORMAL LOW (ref >60)
Glucose, Bld: 110 mg/dL — ABNORMAL HIGH (ref 70–99)
Potassium: 4.9 mmol/L (ref 3.5–5.1)
Sodium: 138 mmol/L (ref 135–145)

## 2019-08-29 LAB — PROTIME-INR
INR: 1.1 (ref 0.8–1.2)
Prothrombin Time: 13.7 seconds (ref 11.4–15.2)

## 2019-08-29 LAB — MRSA PCR SCREENING: MRSA by PCR: NEGATIVE

## 2019-08-29 LAB — ECHOCARDIOGRAM COMPLETE
Height: 62 in
Weight: 3104 oz

## 2019-08-29 LAB — SARS CORONAVIRUS 2 (TAT 6-24 HRS): SARS Coronavirus 2: NEGATIVE

## 2019-08-29 LAB — CREATININE, URINE, RANDOM: Creatinine, Urine: 47.33 mg/dL

## 2019-08-29 LAB — SODIUM, URINE, RANDOM: Sodium, Ur: 76 mmol/L

## 2019-08-29 MED ORDER — SODIUM CHLORIDE 0.9% FLUSH
3.0000 mL | Freq: Two times a day (BID) | INTRAVENOUS | Status: DC
  Administered 2019-08-30 – 2019-09-03 (×7): 3 mL via INTRAVENOUS

## 2019-08-29 MED ORDER — SODIUM CHLORIDE 0.9 % IV SOLN
250.0000 mL | INTRAVENOUS | Status: DC | PRN
  Administered 2019-09-03: 11:00:00 via INTRAVENOUS

## 2019-08-29 MED ORDER — PIPERACILLIN-TAZOBACTAM 3.375 G IVPB 30 MIN
3.3750 g | Freq: Once | INTRAVENOUS | Status: AC
  Administered 2019-08-29: 3.375 g via INTRAVENOUS
  Filled 2019-08-29 (×2): qty 50

## 2019-08-29 MED ORDER — ALLOPURINOL 100 MG PO TABS
100.0000 mg | ORAL_TABLET | Freq: Every day | ORAL | Status: DC
  Administered 2019-08-29 – 2019-09-17 (×19): 100 mg via ORAL
  Filled 2019-08-29 (×19): qty 1

## 2019-08-29 MED ORDER — PIPERACILLIN-TAZOBACTAM IN DEX 2-0.25 GM/50ML IV SOLN
2.2500 g | Freq: Three times a day (TID) | INTRAVENOUS | Status: DC
  Administered 2019-08-30 – 2019-08-31 (×4): 2.25 g via INTRAVENOUS
  Filled 2019-08-29 (×5): qty 50

## 2019-08-29 MED ORDER — FENTANYL CITRATE (PF) 100 MCG/2ML IJ SOLN
25.0000 ug | INTRAMUSCULAR | Status: DC | PRN
  Administered 2019-08-29 – 2019-08-31 (×11): 50 ug via INTRAVENOUS
  Administered 2019-08-31: 18:00:00 25 ug via INTRAVENOUS
  Administered 2019-09-01 – 2019-09-03 (×3): 50 ug via INTRAVENOUS
  Administered 2019-09-03 – 2019-09-05 (×8): 25 ug via INTRAVENOUS
  Administered 2019-09-05 (×2): 50 ug via INTRAVENOUS
  Administered 2019-09-05: 12:00:00 25 ug via INTRAVENOUS
  Administered 2019-09-05 – 2019-09-06 (×3): 50 ug via INTRAVENOUS
  Administered 2019-09-09: 25 ug via INTRAVENOUS
  Administered 2019-09-09 – 2019-09-11 (×4): 50 ug via INTRAVENOUS
  Filled 2019-08-29 (×35): qty 2

## 2019-08-29 MED ORDER — SODIUM CHLORIDE 0.9 % IV SOLN
INTRAVENOUS | Status: DC
  Administered 2019-08-29 – 2019-08-31 (×4): via INTRAVENOUS

## 2019-08-29 MED ORDER — FENTANYL CITRATE (PF) 100 MCG/2ML IJ SOLN
50.0000 ug | Freq: Once | INTRAMUSCULAR | Status: AC
  Administered 2019-08-29: 04:00:00 50 ug via INTRAVENOUS
  Filled 2019-08-29: qty 2

## 2019-08-29 MED ORDER — ACETAMINOPHEN 650 MG RE SUPP: 650.0000 mg | Freq: Four times a day (QID) | RECTAL | Status: DC | PRN

## 2019-08-29 MED ORDER — DILTIAZEM HCL-DEXTROSE 125-5 MG/125ML-% IV SOLN (PREMIX)
5.0000 mg/h | INTRAVENOUS | Status: DC
  Administered 2019-08-29 – 2019-08-30 (×2): 5 mg/h via INTRAVENOUS
  Filled 2019-08-29 (×2): qty 125

## 2019-08-29 MED ORDER — SODIUM CHLORIDE 0.9% FLUSH
3.0000 mL | Freq: Two times a day (BID) | INTRAVENOUS | Status: DC
  Administered 2019-08-30 – 2019-09-04 (×10): 3 mL via INTRAVENOUS

## 2019-08-29 MED ORDER — METOPROLOL SUCCINATE ER 50 MG PO TB24
100.0000 mg | ORAL_TABLET | Freq: Every day | ORAL | Status: DC
  Administered 2019-08-29 – 2019-09-17 (×19): 100 mg via ORAL
  Filled 2019-08-29: qty 1
  Filled 2019-08-29 (×2): qty 4
  Filled 2019-08-29: qty 2
  Filled 2019-08-29: qty 4
  Filled 2019-08-29: qty 2
  Filled 2019-08-29: qty 4
  Filled 2019-08-29: qty 2
  Filled 2019-08-29: qty 4
  Filled 2019-08-29: qty 2
  Filled 2019-08-29: qty 4
  Filled 2019-08-29: qty 2
  Filled 2019-08-29: qty 4
  Filled 2019-08-29 (×8): qty 2

## 2019-08-29 MED ORDER — ONDANSETRON HCL 4 MG/2ML IJ SOLN
4.0000 mg | Freq: Four times a day (QID) | INTRAMUSCULAR | Status: DC | PRN
  Administered 2019-09-03 – 2019-09-10 (×5): 4 mg via INTRAVENOUS
  Filled 2019-08-29 (×4): qty 2

## 2019-08-29 MED ORDER — TECHNETIUM TC 99M MEBROFENIN IV KIT
5.1000 | PACK | Freq: Once | INTRAVENOUS | Status: AC | PRN
  Administered 2019-08-29: 10:00:00 5.1 via INTRAVENOUS

## 2019-08-29 MED ORDER — ACETAMINOPHEN 325 MG PO TABS
650.0000 mg | ORAL_TABLET | Freq: Four times a day (QID) | ORAL | Status: DC | PRN
  Administered 2019-08-29 – 2019-09-15 (×25): 650 mg via ORAL
  Filled 2019-08-29 (×25): qty 2

## 2019-08-29 MED ORDER — CHLORHEXIDINE GLUCONATE CLOTH 2 % EX PADS
6.0000 | MEDICATED_PAD | Freq: Every day | CUTANEOUS | Status: DC
  Administered 2019-08-29 – 2019-09-05 (×8): 6 via TOPICAL

## 2019-08-29 MED ORDER — DILTIAZEM LOAD VIA INFUSION
10.0000 mg | Freq: Once | INTRAVENOUS | Status: AC
  Administered 2019-08-29: 11:00:00 10 mg via INTRAVENOUS
  Filled 2019-08-29: qty 10

## 2019-08-29 MED ORDER — SODIUM CHLORIDE 0.9% FLUSH: 3.0000 mL | INTRAVENOUS | Status: DC | PRN

## 2019-08-29 MED ORDER — ORAL CARE MOUTH RINSE
15.0000 mL | Freq: Two times a day (BID) | OROMUCOSAL | Status: DC
  Administered 2019-08-30 – 2019-09-17 (×33): 15 mL via OROMUCOSAL

## 2019-08-29 MED ORDER — DIAZEPAM 5 MG PO TABS
2.5000 mg | ORAL_TABLET | Freq: Every day | ORAL | Status: DC
  Administered 2019-08-29 – 2019-09-16 (×17): 2.5 mg via ORAL
  Filled 2019-08-29 (×18): qty 1

## 2019-08-29 MED ORDER — FUROSEMIDE 10 MG/ML IJ SOLN
40.0000 mg | Freq: Two times a day (BID) | INTRAMUSCULAR | Status: DC
  Administered 2019-08-29: 05:00:00 40 mg via INTRAVENOUS
  Filled 2019-08-29: qty 4

## 2019-08-29 NOTE — Progress Notes (Signed)
  Echocardiogram 2D Echocardiogram has been performed.  Kristen [name] 08/29/2019, 2:50 PM

## 2019-08-29 NOTE — Progress Notes (Addendum)
PROGRESS NOTE    Kristen [name]  PZW:[id number] DOB: 11-13-1940 DOA: 08/28/2019 PCP: [name], MD   Brief Narrative:  Patient is a 3 female with history of anxiety, A. fib on Eliquis, hypertension, CKD with uncertain baseline, who presents to the emergency room with abdominal pain.  Patient underwent laparoscopic cholecystectomy on 08/22/2019 and was recovering uneventfully back to home but started developing severe abdomen pain on right upper quadrant with radiation towards the right shoulder blade so she presented  to the emergency department.  In the emergency department she was found to be hypoxic, tachycardic.  Chest x-ray was concerning for interstitial edema.  BNP was elevated her blood work showed creatinine of 3.79 up from 1.3 a week earlier.  CT abdomen/pelvis revealed fluid collection in the gallbladder fossa.  General surgery consulted.  HIDA scan done today showed biliary leak and no activity in the small bowel.  While in the emergency department, she developed A. fib with RVR and had to be started on Cardizem drip.  Assessment & Plan:   Principal Problem:   Acute renal failure (ARF) (HCC) Active Problems:   Hypertensive heart disease   Permanent atrial fibrillation (HCC)   Acute on chronic diastolic CHF (congestive heart failure) (HCC)   Situational mixed anxiety and depressive disorder   Intraabdominal fluid collection   A-fib (HCC)  Abdominal pain/biliary leak: HIDA scan suggestive of this.  Presented with severe right upper quadrant pain.  She has severe tenderness in the right upper quadrant.  General surgery following.  CT scan showed fluid collection in the gallbladder fossa.  Acute kidney injury: BNP elevated.  She had bilateral basal crackles.  Chest x-ray showed pulmonary edema.  Could be cardiorenal syndrome.  She was also on ibuprofen and lisinopril at home.  She appears volume overloaded though her mouth is dry.  Will continue Lasix for a few doses.I will  check her BMP later this afternoon.  Acute on chronic diastolic CHF/acute hypoxic respiratory failure: Not on oxygen at home.  Elevated BNP.  Has peripheral edema.  Chest x-ray showed interstitial edema and she has bilateral basal crackles.  Continue Lasix for now.  I have ordered echocardiogram.  A. fib with UMP:[id number] at least 5 (age x2, gender, CHF, HTN).  Went into A. fib in the emergency department.  Started on Cardizem drip.  On Eliquis which is on hold pending surgical decision.  Hypertension: Currently blood pressure stable.  Continue current medicines.         DVT prophylaxis:SCD Code Status: Full Family Communication: None present at the bedside Disposition Plan: Undetermined at this point   Consultants: General surgery  Procedures: None  Antimicrobials:  Anti-infectives (From admission, onward)   None      Subjective:  Patient seen and examined at bedside this morning. Complains of severe right upper quadrant pain.  Denies any chest pain or shortness of breath during my evaluation.  Looked volume overloaded with peripheral edema, basal crackles.   Objective: Vitals:   08/29/19 0900 08/29/19 0930 08/29/19 1121 08/29/19 1130  BP: 138/67 (!) 152/86 131/74 (!) 141/75  Pulse: 87 (!) 113 (!) 127 (!) 109  Resp: 19 (!) 22 (!) 24 (!) 23  Temp:      TempSrc:      SpO2: 95% 96% 97% 95%  Weight:      Height:        Intake/Output Summary (Last 24 hours) at 08/29/2019 1252 Last data filed at 08/29/2019 0415 Gross per 24 hour  Intake 900 ml  Output -  Net 900 ml   Filed Weights   08/28/19 1650  Weight: 88 kg    Examination:  General exam: Ill looking, in moderate distress due to abdominal pain  HEENT:PERRL,Oral mucosa dry, Ear/Nose normal on gross exam Respiratory system: Bilateral basal crackles Cardiovascular system: A. fib with RVR.  No JVD, murmurs, rubs, gallops or clicks.  1-2+ pedal edema. Gastrointestinal system: Abdomen is nondistended, soft.   Right upper quadrant tenderness.  No organomegaly or masses felt. Normal bowel sounds heard. Central nervous system: Alert and oriented. No focal neurological deficits. Extremities: 1-2+ bilateral lower extremity edema, no clubbing ,no cyanosis, distal peripheral pulses palpable. Skin: No rashes, lesions or ulcers,no icterus ,no pallor    Data Reviewed: I have personally reviewed following labs and imaging studies  CBC: Recent Labs  Lab 08/28/19 1655 08/29/19 0526  WBC 12.1* 10.3  NEUTROABS  --  7.2  HGB 11.9* 11.1*  HCT 35.9* 34.1*  MCV 95.2 96.1  PLT 301 295   Basic Metabolic Panel: Recent Labs  Lab 08/28/19 1655 08/29/19 0526  NA 140 135  K 4.8 3.9  CL 100 99  CO2 26 24  GLUCOSE 118* 105*  BUN 46* 40*  CREATININE 3.79* 3.33*  CALCIUM 10.4* 9.2   GFR: Estimated Creatinine Clearance: 14.1 mL/min (A) (by C-G formula based on SCr of 3.33 mg/dL (H)). Liver Function Tests: Recent Labs  Lab 08/28/19 1655 08/29/19 0526  AST 25 22  ALT 16 15  ALKPHOS 80 78  BILITOT 1.1 1.4*  PROT 7.8 6.8  ALBUMIN 3.6 3.2*   Recent Labs  Lab 08/28/19 1655  LIPASE 42   No results for input(s): AMMONIA in the last 168 hours. Coagulation Profile: No results for input(s): INR, PROTIME in the last 168 hours. Cardiac Enzymes: No results for input(s): CKTOTAL, CKMB, CKMBINDEX, TROPONINI in the last 168 hours. BNP (last 3 results) No results for input(s): PROBNP in the last 8760 hours. HbA1C: No results for input(s): HGBA1C in the last 72 hours. CBG: No results for input(s): GLUCAP in the last 168 hours. Lipid Profile: No results for input(s): CHOL, HDL, LDLCALC, TRIG, CHOLHDL, LDLDIRECT in the last 72 hours. Thyroid Function Tests: No results for input(s): TSH, T4TOTAL, FREET4, T3FREE, THYROIDAB in the last 72 hours. Anemia Panel: No results for input(s): VITAMINB12, FOLATE, FERRITIN, TIBC, IRON, RETICCTPCT in the last 72 hours. Sepsis Labs: No results for input(s):  PROCALCITON, LATICACIDVEN in the last 168 hours.  Recent Results (from the past 240 hour(s))  SARS CORONAVIRUS 2 (TAT 6-24 HRS) Nasopharyngeal Nasopharyngeal Swab     Status: None   Collection Time: 08/19/19  3:26 PM   Specimen: Nasopharyngeal Swab  Result Value Ref Range Status   SARS Coronavirus 2 NEGATIVE NEGATIVE Final    Comment: (NOTE) SARS-CoV-2 target nucleic acids are NOT DETECTED. The SARS-CoV-2 RNA is generally detectable in upper and lower respiratory specimens during the acute phase of infection. Negative results do not preclude SARS-CoV-2 infection, do not rule out co-infections with other pathogens, and should not be used as the sole basis for treatment or other patient management decisions. Negative results must be combined with clinical observations, patient history, and epidemiological information. The expected result is Negative. Fact Sheet for Patients: SugarRoll.be Fact Sheet for Healthcare Providers: https://www.woods-mathews.com/ This test is not yet approved or cleared by the [place] FDA and  has been authorized for detection and/or diagnosis of SARS-CoV-2 by FDA under an Emergency Use Authorization (EUA). This  EUA will remain  in effect (meaning this test can be used) for the duration of the COVID-19 declaration under Section 56 4(b)(1) of the Act, 21 U.S.C. section 360bbb-3(b)(1), unless the authorization is terminated or revoked sooner. Performed at [facility], [place] [address], [place], [place] [address]          Radiology Studies: Ct Abdomen Pelvis Wo Contrast  Result Date: 08/29/2019 CLINICAL DATA:  Acute generalized abdominal pain. Status post cholecystectomy 08/22/2019. Patient not a pain medication. EXAM: CT ABDOMEN AND PELVIS WITHOUT CONTRAST TECHNIQUE: Multidetector CT imaging of the abdomen and pelvis was performed following the standard protocol without IV contrast. COMPARISON:  CT  08/10/2010, no interval CT FINDINGS: Lower chest: Small right and trace left pleural effusion. Bilateral lower lobe atelectasis. Compressive atelectasis in the right middle lobe related to elevated hemidiaphragm. Cardiomegaly. Hepatobiliary: Post cholecystectomy with fluid collection in the gallbladder fossa measuring 6.5 x 2.5 cm. Fluid measures simple density. Tiny dependent hyperdensities in the fluid collection, images 27 and 28 series 2 are nonspecific may represent drop stones. Small volume of simple perihepatic free fluid. Common bile duct is poorly defined, however measures approximately 10 mm. No visualized choledocholithiasis. Suggestion of slight nodular hepatic contours. No evidence of focal hepatic lesion. Pancreas: No ductal dilatation or inflammation. Spleen: Normal in size without focal abnormality. Adrenals/Urinary Tract: Normal adrenal glands. Extrarenal pelvis configuration of the right greater than left kidney. No renal stones. No perinephric edema. Urinary bladder is physiologically distended. No wall thickening. No bladder stone. Stomach/Bowel: Ingested contrast in the stomach. No gastric wall thickening. Administered enteric contrast reaches mid distal small bowel. No small bowel obstruction. Normal appendix. Moderate stool in the proximal colon. Small volume of stool in the more distal colon. Mild distal descending diverticulosis, prominent diverticulosis in the sigmoid. No evidence of diverticulitis. Vascular/Lymphatic: Mild aortic atherosclerosis. No aneurysm. Definite enlarged lymph nodes in the abdomen or pelvis. Reproductive: Atrophic uterus. No gross adnexal mass. Other: Small volume simple free fluid in the pelvis. Minimal fluid in the right pericolic gutter. No free air. Expected stranding in the subcutaneous tissues port sites. No subcutaneous fluid collection. Musculoskeletal: There are no acute or suspicious osseous abnormalities. Scoliosis and degenerative change in the spine.  IMPRESSION: 1. Post recent cholecystectomy with fluid collection in the gallbladder fossa measuring 6.5 x 2.5 cm, may be postoperative seroma or biloma. Tiny dependent hyperdensities within the fluid collection are nonspecific, may represent drop stones or potentially surgical packing. Consider nuclear medicine hepatic biliary scan if there is concern for biloma. 2. Prominent common bile duct at 10 mm, may be normal post cholecystectomy. 3. Additional free fluid about the liver and tracking into the pelvis measures simple fluid density. 4. Small right pleural effusion.  Bibasilar atelectasis. 5. Colonic diverticulosis without diverticulitis. Aortic Atherosclerosis (ICD10-I70.0). Electronically Signed   By: Keith Rake M.D.   On: 08/29/2019 02:36   Dg Chest Port 1 View  Result Date: 08/28/2019 CLINICAL DATA:  CHF, shortness of breath EXAM: PORTABLE CHEST 1 VIEW COMPARISON:  None. FINDINGS: There is mild cardiomegaly. Pulmonary vascular congestion is seen. There is mildly increased interstitial markings throughout both lungs. There is blunting of the left costophrenic angle which could be due to a trace left pleural effusion. No acute osseous abnormality. IMPRESSION: Mild cardiomegaly and interstitial edema. Probable trace left pleural effusion. Electronically Signed   By: Prudencio Pair M.D.   On: 08/28/2019 22:34   Nm Hepato Biliary Leak  Addendum Date: 08/29/2019   ADDENDUM  REPORT: 08/29/2019 11:46 ADDENDUM: These results were called by telephone at the time of interpretation on 08/29/2019 at 11:46 am to provider Dr. Marlou Starks, who verbally acknowledged these results. Electronically Signed   By: Zetta Bills M.D.   On: 08/29/2019 11:46   Result Date: 08/29/2019 CLINICAL DATA:  History of suspected bile leak. EXAM: NUCLEAR MEDICINE HEPATOBILIARY IMAGING TECHNIQUE: Sequential images of the abdomen were obtained out to 60 minutes following intravenous administration of radiopharmaceutical.  RADIOPHARMACEUTICALS:  5.1 mCi Tc-85m  Choletec IV COMPARISON:  CT study of 08/29/2019 FINDINGS: Prompt uptake of radiotracer into hepatic parenchyma with excretion into the biliary tree is noted. An amorphous collection of radiotracer is demonstrated, enlarging over time, over the right upper quadrant extending into the expected area of gallbladder fossa and porta hepatis. Likely tracking up over the liver as well. IMPRESSION: Nuclear medicine signs of biliary leak. No signs of small bowel activity. A call is out to the referring provider to discuss findings in the above case. Electronically Signed: By: Zetta Bills M.D. On: 08/29/2019 11:39        Scheduled Meds: . allopurinol  100 mg Oral Daily  . diazepam  2.5 mg Oral QHS  . furosemide  40 mg Intravenous BID  . metoprolol succinate  100 mg Oral Daily  . sodium chloride flush  3 mL Intravenous Q12H  . sodium chloride flush  3 mL Intravenous Q12H   Continuous Infusions: . sodium chloride    . diltiazem (CARDIZEM) infusion 5 mg/hr (08/29/19 1119)     LOS: 0 days    Time spent: 25 mins.More than 50% of that time was spent in counseling and/or coordination of care.      Shelly Coss, MD [facility] Pager [phone]  If 7PM-7AM, please contact night-coverage [email or web address] Password [email or web address] 08/29/2019, 12:52 PM

## 2019-08-29 NOTE — ED Notes (Signed)
Patient SpO2 dropped to 90% on RA. Patient placed on 2 L of O2 via [place] with an improvement to 96%. EDP made aware.

## 2019-08-29 NOTE — Progress Notes (Signed)
Subjective: Pt is s/p lap chole with Dr Kieth Brightly 7 days ago.  Per op note, surgery was uneventful.  Presented to ED last night with worsening RUQ pain.  Found to be in ARF.  Pt is on Elliquis but has not restarted this after surgery.  She is tolerating a diet without nausea and having good bowel function  Objective: Vital signs in last 24 hours: Temp:  [98.3 F (36.8 C)] 98.3 F (36.8 C) (10/21 1635) Pulse Rate:  [68-111] 110 (10/22 0400) Resp:  [16-20] 17 (10/22 0400) BP: (120-159)/(63-97) 132/69 (10/22 0400) SpO2:  [85 %-99 %] 97 % (10/22 0400) Weight:  [88 kg] 88 kg (10/21 1650)   Intake/Output from previous day: 10/21 0701 - 10/22 0700 In: 900 [I.V.:400; IV Piggyback:500] Out: -  Intake/Output this shift: Total I/O In: 900 [I.V.:400; IV Piggyback:500] Out: -    General appearance: alert and cooperative GI: normal findings: soft, mild TTP in RUQ  Incision: healing well  Lab Results:  Recent Labs    08/28/19 1655  WBC 12.1*  HGB 11.9*  HCT 35.9*  PLT 301   BMET Recent Labs    08/28/19 1655  NA 140  K 4.8  CL 100  CO2 26  GLUCOSE 118*  BUN 46*  CREATININE 3.79*  CALCIUM 10.4*   PT/INR No results for input(s): LABPROT, INR in the last 72 hours. ABG No results for input(s): PHART, HCO3 in the last 72 hours.  Invalid input(s): PCO2, PO2  MEDS, Scheduled . allopurinol  100 mg Oral Daily  . diazepam  2.5 mg Oral QHS  . furosemide  40 mg Intravenous BID  . metoprolol succinate  100 mg Oral Daily  . sodium chloride flush  3 mL Intravenous Q12H  . sodium chloride flush  3 mL Intravenous Q12H    Studies/Results: Ct Abdomen Pelvis Wo Contrast  Result Date: 08/29/2019 CLINICAL DATA:  Acute generalized abdominal pain. Status post cholecystectomy 08/22/2019. Patient not a pain medication. EXAM: CT ABDOMEN AND PELVIS WITHOUT CONTRAST TECHNIQUE: Multidetector CT imaging of the abdomen and pelvis was performed following the standard protocol without IV  contrast. COMPARISON:  CT 08/10/2010, no interval CT FINDINGS: Lower chest: Small right and trace left pleural effusion. Bilateral lower lobe atelectasis. Compressive atelectasis in the right middle lobe related to elevated hemidiaphragm. Cardiomegaly. Hepatobiliary: Post cholecystectomy with fluid collection in the gallbladder fossa measuring 6.5 x 2.5 cm. Fluid measures simple density. Tiny dependent hyperdensities in the fluid collection, images 27 and 28 series 2 are nonspecific may represent drop stones. Small volume of simple perihepatic free fluid. Common bile duct is poorly defined, however measures approximately 10 mm. No visualized choledocholithiasis. Suggestion of slight nodular hepatic contours. No evidence of focal hepatic lesion. Pancreas: No ductal dilatation or inflammation. Spleen: Normal in size without focal abnormality. Adrenals/Urinary Tract: Normal adrenal glands. Extrarenal pelvis configuration of the right greater than left kidney. No renal stones. No perinephric edema. Urinary bladder is physiologically distended. No wall thickening. No bladder stone. Stomach/Bowel: Ingested contrast in the stomach. No gastric wall thickening. Administered enteric contrast reaches mid distal small bowel. No small bowel obstruction. Normal appendix. Moderate stool in the proximal colon. Small volume of stool in the more distal colon. Mild distal descending diverticulosis, prominent diverticulosis in the sigmoid. No evidence of diverticulitis. Vascular/Lymphatic: Mild aortic atherosclerosis. No aneurysm. Definite enlarged lymph nodes in the abdomen or pelvis. Reproductive: Atrophic uterus. No gross adnexal mass. Other: Small volume simple free fluid in the pelvis. Minimal fluid in the  right pericolic gutter. No free air. Expected stranding in the subcutaneous tissues port sites. No subcutaneous fluid collection. Musculoskeletal: There are no acute or suspicious osseous abnormalities. Scoliosis and  degenerative change in the spine. IMPRESSION: 1. Post recent cholecystectomy with fluid collection in the gallbladder fossa measuring 6.5 x 2.5 cm, may be postoperative seroma or biloma. Tiny dependent hyperdensities within the fluid collection are nonspecific, may represent drop stones or potentially surgical packing. Consider nuclear medicine hepatic biliary scan if there is concern for biloma. 2. Prominent common bile duct at 10 mm, may be normal post cholecystectomy. 3. Additional free fluid about the liver and tracking into the pelvis measures simple fluid density. 4. Small right pleural effusion.  Bibasilar atelectasis. 5. Colonic diverticulosis without diverticulitis. Aortic Atherosclerosis (ICD10-I70.0). Electronically Signed   By: Keith Rake M.D.   On: 08/29/2019 02:36   Dg Chest Port 1 View  Result Date: 08/28/2019 CLINICAL DATA:  CHF, shortness of breath EXAM: PORTABLE CHEST 1 VIEW COMPARISON:  None. FINDINGS: There is mild cardiomegaly. Pulmonary vascular congestion is seen. There is mildly increased interstitial markings throughout both lungs. There is blunting of the left costophrenic angle which could be due to a trace left pleural effusion. No acute osseous abnormality. IMPRESSION: Mild cardiomegaly and interstitial edema. Probable trace left pleural effusion. Electronically Signed   By: Prudencio Pair M.D.   On: 08/28/2019 22:34    Assessment: s/p  Patient Active Problem List   Diagnosis Date Noted  . Acute renal failure (ARF) ([place]) 08/29/2019  . Intraabdominal fluid collection 08/29/2019  . Varicose veins of leg with edema, right 05/21/2019  . Ankle swelling, right 05/21/2019  . Bunion of great toe of right foot 05/21/2019  . Osteoarthritis 03/13/2018  . Gait abnormality 03/13/2018  . Skin lesion 02/13/2018  . 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 Abnormal Pap smear of cervix 02/08/2016  . Chronic kidney disease 02/08/2016  . Urinary frequency  02/08/2016  . History of diverticulitis 01/22/2016  . Anxiety state 10/17/2014  . Long-term (current) use of anticoagulants 06/10/2014  . Chronic venous insufficiency 06/10/2014  . Obesity (BMI 30-39.9)   . Acute on chronic diastolic CHF (congestive heart failure) ([place])   . Candidal intertrigo 06/09/2014  . Nocturnal leg cramps 04/13/2012  . Insomnia 01/10/2011  . Permanent atrial fibrillation ([place])   . OSTEOPENIA 01/31/2008  . Hypertensive heart disease     Pt with ARF after lap chole.  Plan: small fluid collection in gallbladder fossa.  Normal T bili and wbc Will get HIDA to rule out bile leak   LOS: 0 days     .Rosario Adie, MD [facility], [place]   08/29/2019 5:06 AM

## 2019-08-29 NOTE — Progress Notes (Signed)
Pharmacy Antibiotic Note  Kristen [name] is a 78 y.o. female admitted on 08/28/2019 with bile leak after lap chole.  Pharmacy has been consulted for zosyn dosing.  Plan: Zosyn 3.375 mg IV over 30 minutes followed by zosyn 2.25 gm IV q8h for CrCl < 20 ml/min F/u renal fxn, WBC, temp, culture data  Height: 5\' 2"  (157.5 cm) Weight: 194 lb 14.2 oz (88.4 kg) IBW/kg (Calculated) : 50.1  Temp (24hrs), Avg:100.6 F (38.1 C), Min:100.6 F (38.1 C), Max:100.6 F (38.1 C)  Recent Labs  Lab 08/28/19 1655 08/29/19 0526 08/29/19 1508  WBC 12.1* 10.3  --   CREATININE 3.79* 3.33* 3.01*    Estimated Creatinine Clearance: 15.6 mL/min (A) (by C-G formula based on SCr of 3.01 mg/dL (H)).    Allergies  Allergen Reactions  . Flecainide Acetate Other (See Comments)    REACTION: SOB, swelling (hospitalized)    Thank you for allowing pharmacy to be a part of this patient's care.  Eudelia Bunch, Pharm.D [phone] 08/29/2019 5:50 PM

## 2019-08-29 NOTE — ED Notes (Signed)
Radiologist, Layne Benton, called and told RN that patient is having a biliary leak with fluid spilling around and over liver.

## 2019-08-29 NOTE — ED Notes (Signed)
Patient taken to IR

## 2019-08-29 NOTE — ED Notes (Addendum)
IR called and told RN that patient's HR was in the 180s. MD notified. Brought patient back to ED room and given Cardizem bolus (10 mg)  and then put on continuous drip (5mg ) per MD orders. Pt HR is now 120s. Will continue to monitor.

## 2019-08-29 NOTE — H&P (Signed)
History and Physical    Kaylei Frink Loux SHF:[id number] DOB: Sep 30, 1941 DOA: 08/28/2019  PCP: [name], MD   Patient coming from: Home   Chief Complaint: Abdominal pain   HPI: Kristen [name] is a 78 y.o. female with medical history significant for anxiety, atrial fibrillation on Eliquis, hypertension, and chronic kidney disease with uncertain baseline, presenting to the emergency department for evaluation of abdominal pain.  Patient underwent outpatient laparoscopic cholecystectomy on 08/22/2019, had been recovering uneventfully back at home, but ran out of her pain medications and began to develop severe pain in the right upper quadrant with radiation towards the right shoulder blade.  Patient had been taking 800 mg ibuprofen, was not able to control her pain with this, but had more success with hydrocodone before she ran out.  She has had a couple loose stools, but reports good appetite, no vomiting, and continued adherence with her Lasix and blood pressure medications.  She denies fevers, chills, or chest pain.  She denies cough.  She has been short of breath for the past day, but thought that this was due to the pain.  ED Course: Upon arrival to the ED, patient is found to be afebrile, saturating mid to upper 80s on room air, slightly tachycardic, and with stable blood pressure.  Chest x-ray is concerning for interstitial edema.  Chemistry panel notable for BUN of 46 and creatinine of 3.79, up from 1.30 a week earlier.  CBC is notable for a mild normocytic anemia and WBC 12,100.  BNP is elevated to 316.  Urinalysis unremarkable.  CT abdomen/pelvis reveals a fluid collection in the gallbladder fossa that could reflect seroma or biloma, as well as 10 mm CBD which could be normal post-cholecystectomy. Patient was given 500 cc normal saline, continued on normal saline infusion, and treated with fentanyl, Dilaudid, and Zofran in the ED.  General surgery is consulted by the ED physician.   COVID-19 testing is in process.  Review of Systems:  All other systems reviewed and apart from HPI, are negative.  Past Medical History:  Diagnosis Date  . Anticoagulant long-term use    elquis -- managed by cardiology  . Anxiety   . Chronic calculous cholecystitis   . Chronic venous insufficiency    w/  varicose veins  . Depression   . Diastolic CHF, chronic ([place])    followed by cardiology  . Diverticulosis of colon   . Edema of both lower extremities    per pt mostly in summer time  . Essential hypertension, benign   . Fibrocystic breast disease   . Full dentures   . GERD (gastroesophageal reflux disease)    occasional tums and does not eat prior to bedtime  . Gout    08-19-2019  per pt last episode 07/ 2020  . Hiatal hernia   . History of diverticulitis 01/22/2016  . History of recurrent UTIs   . OA (osteoarthritis)    knees, lower back  . Permanent atrial fibrillation [facility]) cardiologist--- dr Agustin Cree   first dx 10/ 2011--- histroy DCCV 11-25-2010 by dr Wynonia Lawman (pt's previous cardiologist) and Cardiac cath 12-17-2010 no significant disease  . Scoliosis   . Stress incontinence in female     Past Surgical History:  Procedure Laterality Date  . BLEPHAROPLASTY Bilateral 02-22-2010  dr Georgia Lopes   upper eyelid's  . BUNIONECTOMY Right 2003  . CATARACT EXTRACTION W/ INTRAOCULAR LENS  IMPLANT, BILATERAL  2018  . CHOLECYSTECTOMY N/A 08/22/2019   Procedure: LAPAROSCOPIC CHOLECYSTECTOMY;  Surgeon:  [name], [name], MD;  Location: [facility];  Service: General;  Laterality: N/A;  . KNEE ARTHROSCOPY Left 2002  . TUBAL LIGATION Bilateral 1980   AND RIGHT BREAST EXCISION CYST (BENIGN)     reports that she has never smoked. She has never used smokeless tobacco. She reports current alcohol use. She reports that she does not use drugs.  Allergies  Allergen Reactions  . Flecainide Acetate Other (See Comments)    REACTION: SOB, swelling (hospitalized)     Family History  Problem Relation Age of Onset  . Cancer Mother        ovarian  . Kidney disease Father   . Alcohol abuse Father   . Cancer Maternal Aunt        lung- smoker  . Heart disease Maternal Uncle      Prior to Admission medications   Medication Sig Start Date End Date Taking? Authorizing Provider  alendronate (FOSAMAX) 10 MG tablet TAKE (1) TABLET BY MOUTH PER WEEK TAKE ON EMPTY STOMACH WITH FULL GLASS OF WATER Patient taking differently: Take 10 mg by mouth daily before breakfast. TAKE (1) TABLET BY MOUTH PER WEEK TAKE ON EMPTY STOMACH WITH FULL GLASS OF WATER 05/02/19  Yes [name], MD  allopurinol (ZYLOPRIM) 100 MG tablet TAKE 1 TABLET BY MOUTH EVERY DAY Patient taking differently: Take 100 mg by mouth daily.  06/24/19  Yes [name], MD  calcium carbonate (OSCAL) 1500 (600 Ca) MG TABS tablet Take 1,500 mg by mouth daily.    Yes [provider]  calcium carbonate (TUMS - DOSED IN MG ELEMENTAL CALCIUM) 500 MG chewable tablet Chew 1 tablet by mouth as needed for indigestion or heartburn.   Yes [provider]  cholecalciferol (VITAMIN D3) 25 MCG (1000 UT) tablet Take 1,000 Units by mouth daily.   Yes [provider]  CINNAMON PO Take 1 tablet by mouth daily.    Yes [provider]  clindamycin (CLINDAGEL) 1 % gel APPLY TO AFFECTED AREA TWICE A DAY Patient taking differently: Apply 1 application topically 2 (two) times daily.  04/13/18  Yes [name], MD  colchicine 0.6 MG tablet Take 1 tablet (0.6 mg total) by mouth 2 (two) times daily. Take twice daily until pain resolves and then take as needed for gout flare. Patient taking differently: Take 0.6 mg by mouth 2 (two) times daily as needed. Take twice daily until pain resolves and then take as needed for gout flare. 06/15/18  Yes [name], MD  [employer] ANTI-FUNGAL 2 % powder APPLY TO AFFECTED AREA TWICE A DAY AFTER SKIN RESOLVES FROM KETOCONAZOLE CREAM Patient taking  differently: as needed.  10/17/14  Yes [name], MD  diazepam (VALIUM) 5 MG tablet TAKE 1/2 TABLET (2.5 MG TOTAL) BY MOUTH AT BEDTIME. Patient taking differently: Take 2.5 mg by mouth at bedtime.  07/08/19  Yes [name], MD  doxycycline (VIBRAMYCIN) 50 MG capsule TAKE 2 CAPSULES BY MOUTH 2 (TWO) TIMES DAILY. Patient taking differently: Take 100 mg by mouth See admin instructions. Twice daily As directed for deer tick's (allergic reaction) 04/25/18  Yes [name], MD  ELIQUIS 5 MG TABS tablet TAKE 1 TABLET BY MOUTH TWICE A DAY Patient taking differently: Take 5 mg by mouth 2 (two) times daily.  05/23/19  Yes Park Liter, MD  Flax Oil-Fish Oil-Borage Oil (FISH-FLAX-BORAGE) CAPS Take 1 capsule by mouth daily.   Yes [provider]  furosemide (LASIX) 20 MG tablet Take  1 tablet (20 mg total) by mouth daily. Patient taking differently: Take 20 mg by mouth daily.  08/19/19  Yes Park Liter, MD  glucosamine-chondroitin (GLUCOSAMINE-CHONDROITIN DS) 500-400 MG tablet Take 1 tablet by mouth daily.    Yes [provider]  HYDROcodone-acetaminophen (NORCO/VICODIN) 5-325 MG tablet Take 1 tablet by mouth every 6 (six) hours as needed for moderate pain. 08/22/19  Yes [name], [name], MD  ibuprofen (ADVIL) 800 MG tablet Take 1 tablet (800 mg total) by mouth every 8 (eight) hours as needed. 08/22/19  Yes [name], [name], MD  lisinopril-hydrochlorothiazide (ZESTORETIC) 20-25 MG tablet Take 1 tablet by mouth daily. Patient taking differently: Take 1 tablet by mouth daily.  03/08/19  Yes [name], MD  loratadine (CLARITIN) 10 MG tablet Take 1 tablet (10 mg total) by mouth daily. 07/08/19  Yes [name], MD  metoprolol succinate (TOPROL-XL) 100 MG 24 hr tablet Take 1 tablet (100 mg total) by mouth daily. Take with or immediately following a meal. Patient taking differently: Take 100 mg by mouth daily. Take with or immediately following a meal.  01/29/19 08/28/19 Yes Park Liter, MD  Multiple Vitamins-Minerals (EMERGEN-C IMMUNE) PACK Take 1 tablet by mouth daily.    Yes [provider]  Multiple Vitamins-Minerals (MULTIVITAMIN WOMEN 50+) TABS Take 1 tablet by mouth daily.    Yes [provider]  mupirocin ointment (BACTROBAN) 2 % APPLY TOPICALLY TO AFFECTED AREA TWICE DAILY Patient taking differently: as needed.  02/20/19  Yes [name], MD  Nutritional Supplements (ECHINACEA/GOLDEN SEAL PO) Take 1 tablet by mouth daily.    Yes [provider]  polycarbophil (FIBERCON) 625 MG tablet Take 625 mg by mouth daily.   Yes [provider]  potassium chloride (KLOR-CON) 20 MEQ packet MIX 1 PACKET IN LIQUID AND TAKE BY MOUTH DAILY AS DIRECTED Patient taking differently: Take 20 mEq by mouth daily. MIX 1 PACKET IN LIQUID AND TAKE BY MOUTH DAILY AS DIRECTED 02/20/19  Yes [name], MD  Probiotic Product (PROBIOTIC-10 PO) Take 1 tablet by mouth daily.    Yes [provider]  TURMERIC PO Take 1 tablet by mouth daily.    Yes [provider]    Physical Exam: Vitals:   08/29/19 0045 08/29/19 0130 08/29/19 0230 08/29/19 0300  BP: 140/74 (!) 150/73 (!) 145/89 130/67  Pulse: (!) 102 68 91 81  Resp: 18 18 18 20   Temp:      TempSrc:      SpO2: 94% 95% 98% 99%  Weight:      Height:        Constitutional: NAD, calm  Eyes: PERTLA, lids and conjunctivae normal ENMT: Mucous membranes are moist. Posterior pharynx clear of any exudate or lesions.   Neck: normal, supple, no masses, no thyromegaly Respiratory:  no wheezing, no rhonchi. Dyspnea with speech. No pallor or cyanosis.    Cardiovascular: Rate ~110 and irregularly irregular. Trace LE edema.  Abdomen: No distension, soft, tender in RUQ and epigastrium without rebound pain or guarding. Bowel sounds active.  Musculoskeletal: no clubbing / cyanosis. No joint deformity upper and lower extremities.    Skin: Mild ecchymosis  involving abdomen about well-healing surgical incisions. Warm, dry, well-perfused. Neurologic: no facial asymmetry. Sensation intact. Moving all extremities.  Psychiatric: Alert and oriented to person, place, and situation. Calm, cooperative.    Labs on Admission: I have personally reviewed following labs and imaging studies  CBC: Recent Labs  Lab 08/22/19 0649 08/28/19 1655  WBC  --  12.1*  HGB 13.6 11.9*  HCT 40.0 35.9*  MCV  --  95.2  PLT  --  626   Basic Metabolic Panel: Recent Labs  Lab 08/22/19 0649 08/28/19 1655  NA 141 140  K 3.6 4.8  CL 100 100  CO2  --  26  GLUCOSE 96 118*  BUN 15 46*  CREATININE 1.30* 3.79*  CALCIUM  --  10.4*   GFR: Estimated Creatinine Clearance: 12.4 mL/min (A) (by C-G formula based on SCr of 3.79 mg/dL (H)). Liver Function Tests: Recent Labs  Lab 08/28/19 1655  AST 25  ALT 16  ALKPHOS 80  BILITOT 1.1  PROT 7.8  ALBUMIN 3.6   Recent Labs  Lab 08/28/19 1655  LIPASE 42   No results for input(s): AMMONIA in the last 168 hours. Coagulation Profile: No results for input(s): INR, PROTIME in the last 168 hours. Cardiac Enzymes: No results for input(s): CKTOTAL, CKMB, CKMBINDEX, TROPONINI in the last 168 hours. BNP (last 3 results) No results for input(s): PROBNP in the last 8760 hours. HbA1C: No results for input(s): HGBA1C in the last 72 hours. CBG: No results for input(s): GLUCAP in the last 168 hours. Lipid Profile: No results for input(s): CHOL, HDL, LDLCALC, TRIG, CHOLHDL, LDLDIRECT in the last 72 hours. Thyroid Function Tests: No results for input(s): TSH, T4TOTAL, FREET4, T3FREE, THYROIDAB in the last 72 hours. Anemia Panel: No results for input(s): VITAMINB12, FOLATE, FERRITIN, TIBC, IRON, RETICCTPCT in the last 72 hours. Urine analysis:    Component Value Date/Time   COLORURINE STRAW (A) 08/28/2019 1655   APPEARANCEUR CLEAR 08/28/2019 1655   LABSPEC 1.009 08/28/2019 1655   PHURINE 6.0 08/28/2019 1655   GLUCOSEU  NEGATIVE 08/28/2019 1655   HGBUR NEGATIVE 08/28/2019 1655   HGBUR negative 01/31/2008 1402   BILIRUBINUR NEGATIVE 08/28/2019 1655   BILIRUBINUR negative 03/25/2013 1700   KETONESUR NEGATIVE 08/28/2019 1655   PROTEINUR NEGATIVE 08/28/2019 1655   UROBILINOGEN 0.2 03/25/2013 1700   UROBILINOGEN 0.2 07/13/2010 1725   NITRITE NEGATIVE 08/28/2019 1655   LEUKOCYTESUR NEGATIVE 08/28/2019 1655   Sepsis Labs: @LABRCNTIP (procalcitonin:4,lacticidven:4) ) Recent Results (from the past 240 hour(s))  SARS CORONAVIRUS 2 (TAT 6-24 HRS) Nasopharyngeal Nasopharyngeal Swab     Status: None   Collection Time: 08/19/19  3:26 PM   Specimen: Nasopharyngeal Swab  Result Value Ref Range Status   SARS Coronavirus 2 NEGATIVE NEGATIVE Final    Comment: (NOTE) SARS-CoV-2 target nucleic acids are NOT DETECTED. The SARS-CoV-2 RNA is generally detectable in upper and lower respiratory specimens during the acute phase of infection. Negative results do not preclude SARS-CoV-2 infection, do not rule out co-infections with other pathogens, and should not be used as the sole basis for treatment or other patient management decisions. Negative results must be combined with clinical observations, patient history, and epidemiological information. The expected result is Negative. Fact Sheet for Patients: SugarRoll.be Fact Sheet for Healthcare Providers: https://www.woods-mathews.com/ This test is not yet approved or cleared by the [place] FDA and  has been authorized for detection and/or diagnosis of SARS-CoV-2 by FDA under an Emergency Use Authorization (EUA). This EUA will remain  in effect (meaning this test can be used) for the duration of the COVID-19 declaration under Section 56 4(b)(1) of the Act, 21 U.S.C. section 360bbb-3(b)(1), unless the authorization is terminated or revoked sooner. Performed at [facility], [place] [address], [place],  [place] [address]      Radiological Exams on Admission: Ct Abdomen Pelvis Wo Contrast  Result Date: 08/29/2019 CLINICAL DATA:  Acute generalized abdominal pain. Status post cholecystectomy 08/22/2019. Patient not a pain medication. EXAM: CT ABDOMEN AND PELVIS WITHOUT CONTRAST TECHNIQUE: Multidetector CT imaging of the abdomen and pelvis was performed following the standard protocol without IV contrast. COMPARISON:  CT 08/10/2010, no interval CT FINDINGS: Lower chest: Small right and trace left pleural effusion. Bilateral lower lobe atelectasis. Compressive atelectasis in the right middle lobe related to elevated hemidiaphragm. Cardiomegaly. Hepatobiliary: Post cholecystectomy with fluid collection in the gallbladder fossa measuring 6.5 x 2.5 cm. Fluid measures simple density. Tiny dependent hyperdensities in the fluid collection, images 27 and 28 series 2 are nonspecific may represent drop stones. Small volume of simple perihepatic free fluid. Common bile duct is poorly defined, however measures approximately 10 mm. No visualized choledocholithiasis. Suggestion of slight nodular hepatic contours. No evidence of focal hepatic lesion. Pancreas: No ductal dilatation or inflammation. Spleen: Normal in size without focal abnormality. Adrenals/Urinary Tract: Normal adrenal glands. Extrarenal pelvis configuration of the right greater than left kidney. No renal stones. No perinephric edema. Urinary bladder is physiologically distended. No wall thickening. No bladder stone. Stomach/Bowel: Ingested contrast in the stomach. No gastric wall thickening. Administered enteric contrast reaches mid distal small bowel. No small bowel obstruction. Normal appendix. Moderate stool in the proximal colon. Small volume of stool in the more distal colon. Mild distal descending diverticulosis, prominent diverticulosis in the sigmoid. No evidence of diverticulitis. Vascular/Lymphatic: Mild aortic atherosclerosis. No aneurysm. Definite  enlarged lymph nodes in the abdomen or pelvis. Reproductive: Atrophic uterus. No gross adnexal mass. Other: Small volume simple free fluid in the pelvis. Minimal fluid in the right pericolic gutter. No free air. Expected stranding in the subcutaneous tissues port sites. No subcutaneous fluid collection. Musculoskeletal: There are no acute or suspicious osseous abnormalities. Scoliosis and degenerative change in the spine. IMPRESSION: 1. Post recent cholecystectomy with fluid collection in the gallbladder fossa measuring 6.5 x 2.5 cm, may be postoperative seroma or biloma. Tiny dependent hyperdensities within the fluid collection are nonspecific, may represent drop stones or potentially surgical packing. Consider nuclear medicine hepatic biliary scan if there is concern for biloma. 2. Prominent common bile duct at 10 mm, may be normal post cholecystectomy. 3. Additional free fluid about the liver and tracking into the pelvis measures simple fluid density. 4. Small right pleural effusion.  Bibasilar atelectasis. 5. Colonic diverticulosis without diverticulitis. Aortic Atherosclerosis (ICD10-I70.0). Electronically Signed   By: Keith Rake M.D.   On: 08/29/2019 02:36   Dg Chest Port 1 View  Result Date: 08/28/2019 CLINICAL DATA:  CHF, shortness of breath EXAM: PORTABLE CHEST 1 VIEW COMPARISON:  None. FINDINGS: There is mild cardiomegaly. Pulmonary vascular congestion is seen. There is mildly increased interstitial markings throughout both lungs. There is blunting of the left costophrenic angle which could be due to a trace left pleural effusion. No acute osseous abnormality. IMPRESSION: Mild cardiomegaly and interstitial edema. Probable trace left pleural effusion. Electronically Signed   By: Prudencio Pair M.D.   On: 08/28/2019 22:34    EKG: not performed.   Assessment/Plan   1. Acute kidney injury  - Presents with RUQ pain after running out of pain medication and is found to have SCr of 3.79, up from  1.30 a week earlier   - She appears hypervolemic with pulm edema and hypoxia, has normal potassium and bicarbonate  - No hydronephrosis noted on CT  - Multiple potential etiologies including hypervolemia, NSAIDs and lisinopril use  - She has pulmonary edema  and hypoxia and will need to be diuresed  - Check FEUrea, diurese with IV Lasix, hold lisinopril, stop NSAIDs, renally-dose medications, repeat chem panel in am    2. Acute on chronic diastolic CHF; acute hypoxic respiratory failure  - Patient was noted to have O2 saturations in mid-80's in ED, has elevated BNP, mild peripheral edema, and CXR with interstitial edema  - She had been given 500 cc NS bolus and continued on NS infusion in ED in light of her AKI  - She will need to be diuresed, will start with Lasix 40 mg IV q12h, follow daily wt and I/O's, hold lisinopril, continue Toprol as tolerated    3. Abdominal pain; s/p lap chole 10/15; postoperative fluid-collection  - Presents with RUQ abdominal pain after running out of pain medications at home, is s/p lab chole on 10/15, and is noted to have fluid collection in gallbladder fossa, seroma vs biloma  - May need HIDA scan, surgery is being consulted by ED physician  - Continue pain-control, continue bowel rest and continue to hold Eliquis pending surgeon's eval    4. Atrial fibrillation  - CHADS-VASc at least 5 (age x2, gender, CHF, HTN) - She has not restarted Eliquis since the recent surgery, will continue to hold pending surgeon's evaluation of postop fluid, continue metoprolol    5. Hypertension  - BP at goal  - Hold lisinopril until renal function stabilized, continue Toprol as tolerated    PPE: Mask, face shield  DVT prophylaxis: SCD's  Code Status: Full  Family Communication: Discussed with patient  Consults called: Gen surgery consulted by ED physician  Admission status: Inpatient. Patient has new renal failure complicated by hypervolemia with pulmonary edema and new  supplemental O2 requirement that cannot be safely evaluated and stabilized within observation timeframe without introducing unnecessary risk. Additionally, the patient has postoperative fluid collection that may require additional evaluation and intervention.     Vianne Bulls, MD [facility] Pager [phone]  If 7PM-7AM, please contact night-coverage [email or web address] Password [email or web address]  08/29/2019, 4:04 AM

## 2019-08-29 NOTE — Progress Notes (Signed)
Reviewed NM study showing bile leak after lap chole. Spoke with patient about findings. Will consult GI for possible ERCP/stent

## 2019-08-29 NOTE — ED Notes (Addendum)
Patient transported to CT 

## 2019-08-29 NOTE — ED Notes (Signed)
Paged [facility] on call two times and called answer services two times befor  get return call.

## 2019-08-29 NOTE — Consult Note (Signed)
[facility] Consultation Note  Referring Provider: [facility] Primary Care Physician:  [name], MD  Reason for Consultation:  Abdominal pain  HPI: Kristen [name] is a 78 y.o. female one week post-op for cholecystectomy for cholecystitis.  For the past few days, a couple days after surgery, she began having escalating abdominal pain, much worse yesterday evening necessitating ED visit.  She was found to have suspected GB fossa biloma; HIDA scan was reported as bile leak.  We were called around 4:45 pm to see patient in consultation.  Patient reports her pain is fairly well controlled with the pain medication she is receiving.  Has history of apixaban use, but has not taken for over one week (since her cholecystectomy).   Past Medical History:  Diagnosis Date  . Anticoagulant long-term use    elquis -- managed by cardiology  . Anxiety   . Chronic calculous cholecystitis   . Chronic venous insufficiency    w/  varicose veins  . Depression   . Diastolic CHF, chronic ([place])    followed by cardiology  . Diverticulosis of colon   . Edema of both lower extremities    per pt mostly in summer time  . Essential hypertension, benign   . Fibrocystic breast disease   . Full dentures   . GERD (gastroesophageal reflux disease)    occasional tums and does not eat prior to bedtime  . Gout    08-19-2019  per pt last episode 07/ 2020  . Hiatal hernia   . History of diverticulitis 01/22/2016  . History of recurrent UTIs   . OA (osteoarthritis)    knees, lower back  . Permanent atrial fibrillation [facility]) cardiologist--- dr Agustin Cree   first dx 10/ 2011--- histroy DCCV 11-25-2010 by dr Wynonia Lawman (pt's previous cardiologist) and Cardiac cath 12-17-2010 no significant disease  . Scoliosis   . Stress incontinence in female     Past Surgical History:  Procedure Laterality Date  . BLEPHAROPLASTY Bilateral 02-22-2010  dr Georgia Lopes   upper eyelid's  . BUNIONECTOMY Right 2003   . CATARACT EXTRACTION W/ INTRAOCULAR LENS  IMPLANT, BILATERAL  2018  . CHOLECYSTECTOMY N/A 08/22/2019   Procedure: LAPAROSCOPIC CHOLECYSTECTOMY;  Surgeon: [name], [name], MD;  Location: [facility];  Service: General;  Laterality: N/A;  . KNEE ARTHROSCOPY Left 2002  . TUBAL LIGATION Bilateral 1980   AND RIGHT BREAST EXCISION CYST (BENIGN)    Prior to Admission medications   Medication Sig Start Date End Date Taking? Authorizing Provider  alendronate (FOSAMAX) 10 MG tablet TAKE (1) TABLET BY MOUTH PER WEEK TAKE ON EMPTY STOMACH WITH FULL GLASS OF WATER Patient taking differently: Take 10 mg by mouth daily before breakfast. TAKE (1) TABLET BY MOUTH PER WEEK TAKE ON EMPTY STOMACH WITH FULL GLASS OF WATER 05/02/19  Yes [name], MD  allopurinol (ZYLOPRIM) 100 MG tablet TAKE 1 TABLET BY MOUTH EVERY DAY Patient taking differently: Take 100 mg by mouth daily.  06/24/19  Yes [name], MD  calcium carbonate (OSCAL) 1500 (600 Ca) MG TABS tablet Take 1,500 mg by mouth daily.    Yes [provider]  calcium carbonate (TUMS - DOSED IN MG ELEMENTAL CALCIUM) 500 MG chewable tablet Chew 1 tablet by mouth as needed for indigestion or heartburn.   Yes [provider]  cholecalciferol (VITAMIN D3) 25 MCG (1000 UT) tablet Take 1,000 Units by mouth daily.   Yes [provider]  CINNAMON PO Take 1 tablet by mouth  daily.    Yes [provider]  clindamycin (CLINDAGEL) 1 % gel APPLY TO AFFECTED AREA TWICE A DAY Patient taking differently: Apply 1 application topically 2 (two) times daily.  04/13/18  Yes [name], MD  colchicine 0.6 MG tablet Take 1 tablet (0.6 mg total) by mouth 2 (two) times daily. Take twice daily until pain resolves and then take as needed for gout flare. Patient taking differently: Take 0.6 mg by mouth 2 (two) times daily as needed. Take twice daily until pain resolves and then take as needed for gout flare. 06/15/18  Yes  [name], MD  [employer] ANTI-FUNGAL 2 % powder APPLY TO AFFECTED AREA TWICE A DAY AFTER SKIN RESOLVES FROM KETOCONAZOLE CREAM Patient taking differently: as needed.  10/17/14  Yes [name], MD  diazepam (VALIUM) 5 MG tablet TAKE 1/2 TABLET (2.5 MG TOTAL) BY MOUTH AT BEDTIME. Patient taking differently: Take 2.5 mg by mouth at bedtime.  07/08/19  Yes [name], MD  doxycycline (VIBRAMYCIN) 50 MG capsule TAKE 2 CAPSULES BY MOUTH 2 (TWO) TIMES DAILY. Patient taking differently: Take 100 mg by mouth See admin instructions. Twice daily As directed for deer tick's (allergic reaction) 04/25/18  Yes [name], MD  ELIQUIS 5 MG TABS tablet TAKE 1 TABLET BY MOUTH TWICE A DAY Patient taking differently: Take 5 mg by mouth 2 (two) times daily.  05/23/19  Yes Park Liter, MD  Flax Oil-Fish Oil-Borage Oil (FISH-FLAX-BORAGE) CAPS Take 1 capsule by mouth daily.   Yes [provider]  furosemide (LASIX) 20 MG tablet Take 1 tablet (20 mg total) by mouth daily. Patient taking differently: Take 20 mg by mouth daily.  08/19/19  Yes Park Liter, MD  glucosamine-chondroitin (GLUCOSAMINE-CHONDROITIN DS) 500-400 MG tablet Take 1 tablet by mouth daily.    Yes [provider]  HYDROcodone-acetaminophen (NORCO/VICODIN) 5-325 MG tablet Take 1 tablet by mouth every 6 (six) hours as needed for moderate pain. 08/22/19  Yes [name], [name], MD  ibuprofen (ADVIL) 800 MG tablet Take 1 tablet (800 mg total) by mouth every 8 (eight) hours as needed. 08/22/19  Yes [name], [name], MD  lisinopril-hydrochlorothiazide (ZESTORETIC) 20-25 MG tablet Take 1 tablet by mouth daily. Patient taking differently: Take 1 tablet by mouth daily.  03/08/19  Yes [name], MD  loratadine (CLARITIN) 10 MG tablet Take 1 tablet (10 mg total) by mouth daily. 07/08/19  Yes [name], MD  metoprolol succinate (TOPROL-XL) 100 MG 24 hr tablet Take 1 tablet (100 mg total) by mouth daily. Take  with or immediately following a meal. Patient taking differently: Take 100 mg by mouth daily. Take with or immediately following a meal. 01/29/19 08/28/19 Yes Park Liter, MD  Multiple Vitamins-Minerals (EMERGEN-C IMMUNE) PACK Take 1 tablet by mouth daily.    Yes [provider]  Multiple Vitamins-Minerals (MULTIVITAMIN WOMEN 50+) TABS Take 1 tablet by mouth daily.    Yes [provider]  mupirocin ointment (BACTROBAN) 2 % APPLY TOPICALLY TO AFFECTED AREA TWICE DAILY Patient taking differently: as needed.  02/20/19  Yes [name], MD  Nutritional Supplements (ECHINACEA/GOLDEN SEAL PO) Take 1 tablet by mouth daily.    Yes [provider]  polycarbophil (FIBERCON) 625 MG tablet Take 625 mg by mouth daily.   Yes [provider]  potassium chloride (KLOR-CON) 20 MEQ packet MIX 1 PACKET IN LIQUID AND TAKE BY MOUTH DAILY AS DIRECTED Patient taking differently: Take 20 mEq by mouth daily. MIX 1  PACKET IN LIQUID AND TAKE BY MOUTH DAILY AS DIRECTED 02/20/19  Yes [name], MD  Probiotic Product (PROBIOTIC-10 PO) Take 1 tablet by mouth daily.    Yes [provider]  TURMERIC PO Take 1 tablet by mouth daily.    Yes [provider]    Current Facility-Administered Medications  Medication Dose Route Frequency Provider Last Rate Last Dose  . 0.9 %  sodium chloride infusion  250 mL Intravenous PRN Opyd, Ilene Qua, MD      . acetaminophen (TYLENOL) tablet 650 mg  650 mg Oral Q6H PRN Opyd, Ilene Qua, MD       Or  . acetaminophen (TYLENOL) suppository 650 mg  650 mg Rectal Q6H PRN Opyd, Ilene Qua, MD      . allopurinol (ZYLOPRIM) tablet 100 mg  100 mg Oral Daily Opyd, Ilene Qua, MD   100 mg at 08/29/19 1125  . Chlorhexidine Gluconate Cloth 2 % PADS 6 each  6 each Topical Daily Shelly Coss, MD   6 each at 08/29/19 1656  . diazepam (VALIUM) tablet 2.5 mg  2.5 mg Oral QHS Opyd, Ilene Qua, MD      . diltiazem (CARDIZEM) 125 mg in dextrose 5%  125 mL (1 mg/mL) infusion  5-15 mg/hr Intravenous Continuous Shelly Coss, MD 5 mL/hr at 08/29/19 1119 5 mg/hr at 08/29/19 1119  . fentaNYL (SUBLIMAZE) injection 25-50 mcg  25-50 mcg Intravenous Q2H PRN Opyd, Ilene Qua, MD   50 mcg at 08/29/19 1639  . furosemide (LASIX) injection 40 mg  40 mg Intravenous BID Vianne Bulls, MD   40 mg at 08/29/19 0525  . MEDLINE mouth rinse  15 mL Mouth Rinse BID Shelly Coss, MD      . metoprolol succinate (TOPROL-XL) 24 hr tablet 100 mg  100 mg Oral Daily Opyd, Ilene Qua, MD   100 mg at 08/29/19 1124  . ondansetron (ZOFRAN) injection 4 mg  4 mg Intravenous Q6H PRN Opyd, Ilene Qua, MD      . sodium chloride flush (NS) 0.9 % injection 3 mL  3 mL Intravenous Q12H Opyd, Ilene Qua, MD   Stopped at 08/29/19 1251  . sodium chloride flush (NS) 0.9 % injection 3 mL  3 mL Intravenous Q12H Opyd, Ilene Qua, MD   Stopped at 08/29/19 1251  . sodium chloride flush (NS) 0.9 % injection 3 mL  3 mL Intravenous PRN Opyd, Ilene Qua, MD        Allergies as of 08/28/2019 - Review Complete 08/28/2019  Allergen Reaction Noted  . Flecainide acetate Other (See Comments)     Family History  Problem Relation Age of Onset  . Cancer Mother        ovarian  . Kidney disease Father   . Alcohol abuse Father   . Cancer Maternal Aunt        lung- smoker  . Heart disease Maternal Uncle     Social History   Socioeconomic History  . Marital status: Widowed    Spouse name: [name]  . Number of children: 1  . Years of education: Masters  . Highest education level: Master's degree (e.g., MA, MS, MEng, MEd, MSW, MBA)  Occupational History  . Occupation: Retired- Product manager: RETIRED  Social Needs  . Financial resource strain: Not hard at all  . Food insecurity    Worry: Never true    Inability: Never true  . Transportation needs    Medical: No    Non-medical:  No  Tobacco Use  . Smoking status: Never Smoker  . Smokeless tobacco: Never Used  Substance and Sexual  Activity  . Alcohol use: Yes    Comment: rarely  . Drug use: Never  . Sexual activity: Not Currently    Birth control/protection: Post-menopausal  Lifestyle  . Physical activity    Days per week: 0 days    Minutes per session: 0 min  . Stress: Not at all  Relationships  . Social Herbalist on phone: Three times a week    Gets together: Never    Attends religious service: Never    Active member of club or organization: Yes    Attends meetings of clubs or organizations: 1 to 4 times per year    Relationship status: Widowed  . Intimate partner violence    Fear of current or ex partner: No    Emotionally abused: No    Physically abused: No    Forced sexual activity: No  Other Topics Concern  . 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 Exercise: Pt dances regularly for shows   Seatbelts: Pt reports wearing seatbelt when in vehicles.    Sun Exposure/Protection: Pt reports not being in the sun very much   Hobbies: dancing, painting,       Working smoke alarm: yes      Home throw rugs: yes         Home free from clutter: yes                Review of Systems: as per HPI  Physical Exam: Vital signs in last 24 hours: Temp:  [100.6 F (38.1 C)] 100.6 F (38.1 C) (10/22 1637) Pulse Rate:  [68-149] 107 (10/22 1700) Resp:  [13-24] 17 (10/22 1700) BP: (120-173)/(63-112) 148/77 (10/22 1700) SpO2:  [85 %-99 %] 93 % (10/22 1700) Weight:  [88.4 kg] 88.4 kg (10/22 1637) Last BM Date: 08/29/19(Per patient) General:  Somnolent but arousable, but NAD Head:  Normocephalic and atraumatic. Eyes:  Sclera clear, no icterus.   Conjunctiva pink. Ears:  Normal auditory acuity. Nose:  No deformity, discharge,  or lesions. Mouth:  No deformity or lesions.  Oropharynx pink & moist. Neck:   Supple; no masses or thyromegaly. Lungs:  Clear throughout to auscultation.   No wheezes, crackles, or rhonchi. No acute distress. Heart:  Regular rate and rhythm; no murmurs, clicks, rubs,  or gallops. Abdomen:  Soft, mild generalized abdominal tenderness without peritonitis; hypoactive but present bowel sounds noted.  No hepatomegaly or hernia noted. Msk:  Symmetrical without gross deformities. Normal posture. Pulses:  Normal pulses noted. Extremities:  Without clubbing or edema. Neurologic:  Alert and  oriented x4; diffusely weak, otherwisegrossly normal neurologically. Skin:  Intact without significant lesions or rashes. Psych:  Somnolent but arousable, Alert and cooperative. Normal mood and affect.   Lab Results: Recent Labs    08/28/19 1655 08/29/19 0526  WBC 12.1* 10.3  HGB 11.9* 11.1*  HCT 35.9* 34.1*  PLT 301 278   BMET Recent Labs    08/28/19 1655 08/29/19 0526 08/29/19 1508  NA 140 135 138  K 4.8 3.9 4.9  CL 100 99 97*  CO2 26 24 28   GLUCOSE 118* 105* 110*  BUN  46* 40* 36*  CREATININE 3.79* 3.33* 3.01*  CALCIUM 10.4* 9.2 9.5   LFT Recent Labs    08/29/19 0526  PROT 6.8  ALBUMIN 3.2*  AST 22  ALT 15  ALKPHOS 78  BILITOT 1.4*   PT/INR No results for input(s): LABPROT, INR in the last 72 hours.  Studies/Results: Ct Abdomen Pelvis Wo Contrast  Result Date: 08/29/2019 CLINICAL DATA:  Acute generalized abdominal pain. Status post cholecystectomy 08/22/2019. Patient not a pain medication. EXAM: CT ABDOMEN AND PELVIS WITHOUT CONTRAST TECHNIQUE: Multidetector CT imaging of the abdomen and pelvis was performed following the standard protocol without IV contrast. COMPARISON:  CT 08/10/2010, no interval CT FINDINGS: Lower chest: Small right and trace left pleural effusion. Bilateral lower lobe atelectasis. Compressive atelectasis in the right middle lobe related to elevated hemidiaphragm. Cardiomegaly. Hepatobiliary: Post cholecystectomy with fluid collection in  the gallbladder fossa measuring 6.5 x 2.5 cm. Fluid measures simple density. Tiny dependent hyperdensities in the fluid collection, images 27 and 28 series 2 are nonspecific may represent drop stones. Small volume of simple perihepatic free fluid. Common bile duct is poorly defined, however measures approximately 10 mm. No visualized choledocholithiasis. Suggestion of slight nodular hepatic contours. No evidence of focal hepatic lesion. Pancreas: No ductal dilatation or inflammation. Spleen: Normal in size without focal abnormality. Adrenals/Urinary Tract: Normal adrenal glands. Extrarenal pelvis configuration of the right greater than left kidney. No renal stones. No perinephric edema. Urinary bladder is physiologically distended. No wall thickening. No bladder stone. Stomach/Bowel: Ingested contrast in the stomach. No gastric wall thickening. Administered enteric contrast reaches mid distal small bowel. No small bowel obstruction. Normal appendix. Moderate stool in the proximal colon. Small volume of stool in the more distal colon. Mild distal descending diverticulosis, prominent diverticulosis in the sigmoid. No evidence of diverticulitis. Vascular/Lymphatic: Mild aortic atherosclerosis. No aneurysm. Definite enlarged lymph nodes in the abdomen or pelvis. Reproductive: Atrophic uterus. No gross adnexal mass. Other: Small volume simple free fluid in the pelvis. Minimal fluid in the right pericolic gutter. No free air. Expected stranding in the subcutaneous tissues port sites. No subcutaneous fluid collection. Musculoskeletal: There are no acute or suspicious osseous abnormalities. Scoliosis and degenerative change in the spine. IMPRESSION: 1. Post recent cholecystectomy with fluid collection in the gallbladder fossa measuring 6.5 x 2.5 cm, may be postoperative seroma or biloma. Tiny dependent hyperdensities within the fluid collection are nonspecific, may represent drop stones or potentially surgical packing.  Consider nuclear medicine hepatic biliary scan if there is concern for biloma. 2. Prominent common bile duct at 10 mm, may be normal post cholecystectomy. 3. Additional free fluid about the liver and tracking into the pelvis measures simple fluid density. 4. Small right pleural effusion.  Bibasilar atelectasis. 5. Colonic diverticulosis without diverticulitis. Aortic Atherosclerosis (ICD10-I70.0). Electronically Signed   By: Keith Rake M.D.   On: 08/29/2019 02:36   Dg Chest Port 1 View  Result Date: 08/28/2019 CLINICAL DATA:  CHF, shortness of breath EXAM: PORTABLE CHEST 1 VIEW COMPARISON:  None. FINDINGS: There is mild cardiomegaly. Pulmonary vascular congestion is seen. There is mildly increased interstitial markings throughout both lungs. There is blunting of the left costophrenic angle which could be due to a trace left pleural effusion. No acute osseous abnormality. IMPRESSION: Mild cardiomegaly and interstitial edema. Probable trace left pleural effusion. Electronically Signed   By: Prudencio Pair M.D.   On: 08/28/2019 22:34   Nm Hepato Biliary Leak  Addendum Date: 08/29/2019   ADDENDUM REPORT: 08/29/2019 11:46 ADDENDUM: These  results were called by telephone at the time of interpretation on 08/29/2019 at 11:46 am to provider Dr. Marlou Starks, who verbally acknowledged these results. Electronically Signed   By: Zetta Bills M.D.   On: 08/29/2019 11:46   Result Date: 08/29/2019 CLINICAL DATA:  History of suspected bile leak. EXAM: NUCLEAR MEDICINE HEPATOBILIARY IMAGING TECHNIQUE: Sequential images of the abdomen were obtained out to 60 minutes following intravenous administration of radiopharmaceutical. RADIOPHARMACEUTICALS:  5.1 mCi Tc-79m  Choletec IV COMPARISON:  CT study of 08/29/2019 FINDINGS: Prompt uptake of radiotracer into hepatic parenchyma with excretion into the biliary tree is noted. An amorphous collection of radiotracer is demonstrated, enlarging over time, over the right upper quadrant  extending into the expected area of gallbladder fossa and porta hepatis. Likely tracking up over the liver as well. IMPRESSION: Nuclear medicine signs of biliary leak. No signs of small bowel activity. A call is out to the referring provider to discuss findings in the above case. Electronically Signed: By: Zetta Bills M.D. On: 08/29/2019 11:39    Impression:  1.  Abdominal pain for a few days, one week s/p cholecystectomy. No peritonitis at this time. 2.  Acute renal failure, likely dehydration. 3.  Bile leak on HIDA scan. 4.  Biloma on CT scan done early this morning.  Plan:  1.  CT scan done about 16 hours ago; I suspect patient's biloma is increasing in size, and resulting in worsening pain, and this won't be adequately addressed with ERCP/stent alone. 2.  Will start antibiotics and keep NPO. 3.  IV fluid hydration. 4.  I talked with Dr. Kathlene Cote with interventional radiology, who will perform percutaneous drain placement tomorrow morning for patient's biloma.  Once this is done, we will follow patient's bile output from the drain, which will then in turn determine whether or not ERCP will be ultimately required.    LOS: 0 days   Bernie Fobes M  08/29/2019, 5:37 PM  Cell [phone] If no answer or after 5 PM call [phone]

## 2019-08-29 NOTE — ED Notes (Signed)
Patient weaned off of oxygen to be reassessed on room air. SpO2 remained 93% and greater. Patient in NAD at this time, but patient is complaining of 8/10 abdominal pain. Will administer ordered medications.

## 2019-08-30 ENCOUNTER — Inpatient Hospital Stay (HOSPITAL_COMMUNITY): Payer: Medicare Other

## 2019-08-30 ENCOUNTER — Encounter (HOSPITAL_COMMUNITY): Payer: Self-pay | Admitting: Radiology

## 2019-08-30 LAB — CBC WITH DIFFERENTIAL/PLATELET
Abs Immature Granulocytes: 0.07 10*3/uL (ref 0.00–0.07)
Basophils Absolute: 0.1 10*3/uL (ref 0.0–0.1)
Basophils Relative: 0 %
Eosinophils Absolute: 0.5 10*3/uL (ref 0.0–0.5)
Eosinophils Relative: 4 %
HCT: 34.9 % — ABNORMAL LOW (ref 36.0–46.0)
Hemoglobin: 11.2 g/dL — ABNORMAL LOW (ref 12.0–15.0)
Immature Granulocytes: 1 %
Lymphocytes Relative: 11 %
Lymphs Abs: 1.4 10*3/uL (ref 0.7–4.0)
MCH: 31 pg (ref 26.0–34.0)
MCHC: 32.1 g/dL (ref 30.0–36.0)
MCV: 96.7 fL (ref 80.0–100.0)
Monocytes Absolute: 1.3 10*3/uL — ABNORMAL HIGH (ref 0.1–1.0)
Monocytes Relative: 11 %
Neutro Abs: 8.9 10*3/uL — ABNORMAL HIGH (ref 1.7–7.7)
Neutrophils Relative %: 73 %
Platelets: 286 10*3/uL (ref 150–400)
RBC: 3.61 MIL/uL — ABNORMAL LOW (ref 3.87–5.11)
RDW: 13.3 % (ref 11.5–15.5)
WBC: 12.2 10*3/uL — ABNORMAL HIGH (ref 4.0–10.5)
nRBC: 0 % (ref 0.0–0.2)

## 2019-08-30 LAB — UREA NITROGEN, URINE: Urea Nitrogen, Ur: 245 mg/dL

## 2019-08-30 LAB — COMPREHENSIVE METABOLIC PANEL
ALT: 13 U/L (ref 0–44)
AST: 20 U/L (ref 15–41)
Albumin: 2.8 g/dL — ABNORMAL LOW (ref 3.5–5.0)
Alkaline Phosphatase: 79 U/L (ref 38–126)
Anion gap: 13 (ref 5–15)
BUN: 34 mg/dL — ABNORMAL HIGH (ref 8–23)
CO2: 24 mmol/L (ref 22–32)
Calcium: 8.6 mg/dL — ABNORMAL LOW (ref 8.9–10.3)
Chloride: 101 mmol/L (ref 98–111)
Creatinine, Ser: 2.57 mg/dL — ABNORMAL HIGH (ref 0.44–1.00)
GFR calc Af Amer: 20 mL/min — ABNORMAL LOW (ref >60)
GFR calc non Af Amer: 17 mL/min — ABNORMAL LOW (ref >60)
Glucose, Bld: 99 mg/dL (ref 70–99)
Potassium: 3.2 mmol/L — ABNORMAL LOW (ref 3.5–5.1)
Sodium: 138 mmol/L (ref 135–145)
Total Bilirubin: 2.1 mg/dL — ABNORMAL HIGH (ref 0.3–1.2)
Total Protein: 6.2 g/dL — ABNORMAL LOW (ref 6.5–8.1)

## 2019-08-30 MED ORDER — SODIUM CHLORIDE 0.9% FLUSH
5.0000 mL | Freq: Three times a day (TID) | INTRAVENOUS | Status: DC
  Administered 2019-08-30 – 2019-09-17 (×42): 5 mL

## 2019-08-30 MED ORDER — FENTANYL CITRATE (PF) 100 MCG/2ML IJ SOLN
INTRAMUSCULAR | Status: AC
  Filled 2019-08-30: qty 2

## 2019-08-30 MED ORDER — POLYVINYL ALCOHOL 1.4 % OP SOLN
1.0000 [drp] | OPHTHALMIC | Status: DC | PRN
  Administered 2019-08-30 – 2019-09-10 (×5): 1 [drp] via OPHTHALMIC
  Filled 2019-08-30: qty 15

## 2019-08-30 MED ORDER — HYDROMORPHONE HCL 1 MG/ML IJ SOLN
INTRAMUSCULAR | Status: AC
  Filled 2019-08-30: qty 1

## 2019-08-30 MED ORDER — HYDROMORPHONE HCL 1 MG/ML IJ SOLN
INTRAMUSCULAR | Status: AC | PRN
  Administered 2019-08-30: 1 mg via INTRAVENOUS

## 2019-08-30 MED ORDER — MIDAZOLAM HCL 2 MG/2ML IJ SOLN
INTRAMUSCULAR | Status: AC
  Filled 2019-08-30: qty 4

## 2019-08-30 MED ORDER — MIDAZOLAM HCL 2 MG/2ML IJ SOLN
INTRAMUSCULAR | Status: AC | PRN
  Administered 2019-08-30 (×4): 1 mg via INTRAVENOUS

## 2019-08-30 MED ORDER — LIDOCAINE HCL (PF) 1 % IJ SOLN
INTRAMUSCULAR | Status: AC | PRN
  Administered 2019-08-30: 5 mL

## 2019-08-30 MED ORDER — FENTANYL CITRATE (PF) 100 MCG/2ML IJ SOLN
INTRAMUSCULAR | Status: AC | PRN
  Administered 2019-08-30: 50 ug via INTRAVENOUS

## 2019-08-30 NOTE — Consult Note (Signed)
Chief Complaint: Patient was seen in consultation today for biloma  Referring Physician(s): Dr. Paulita Fujita  Supervising Physician: Jacqulynn Cadet  Patient Status: [facility] - In-pt  History of Present Illness: Kristen [name] is a 78 y.o. female with past medical history of anxiety, a fib, HTN, CKD who underwent lap cholecystectomy 08/22/19 returns to [facility] ED with severe RUQ pain.   CT Abdomen Pelvis: 1. Post recent cholecystectomy with fluid collection in the gallbladder fossa measuring 6.5 x 2.5 cm, may be postoperative seroma or biloma. Tiny dependent hyperdensities within the fluid collection are nonspecific, may represent drop stones or potentially surgical packing. Consider nuclear medicine hepatic biliary scan if there is concern for biloma. 2. Prominent common bile duct at 10 mm, may be normal post cholecystectomy. 3. Additional free fluid about the liver and tracking into the pelvis measures simple fluid density. 4. Small right pleural effusion.  Bibasilar atelectasis. 5. Colonic diverticulosis without diverticulitis.  HIDA showed: FINDINGS: Prompt uptake of radiotracer into hepatic parenchyma with excretion into the biliary tree is noted. An amorphous collection of radiotracer is demonstrated, enlarging over time, over the right upper quadrant extending into the expected area of gallbladder fossa and porta hepatis. Likely tracking up over the liver as well.  IMPRESSION: Nuclear medicine signs of biliary leak. No signs of small bowel Activity.  IR consulted for cholecystostomy tube placement.  Case reviewed by Dr. Kathlene Cote and Dr. Laurence Ferrari who approve patient for procedure.  She has not taken her Eliquis in ~1 week due to surgery.   Past Medical History:  Diagnosis Date  . Anticoagulant long-term use    elquis -- managed by cardiology  . Anxiety   . Chronic calculous cholecystitis   . Chronic venous insufficiency    w/  varicose veins  . Depression   .  Diastolic CHF, chronic ([place])    followed by cardiology  . Diverticulosis of colon   . Edema of both lower extremities    per pt mostly in summer time  . Essential hypertension, benign   . Fibrocystic breast disease   . Full dentures   . GERD (gastroesophageal reflux disease)    occasional tums and does not eat prior to bedtime  . Gout    08-19-2019  per pt last episode 07/ 2020  . Hiatal hernia   . History of diverticulitis 01/22/2016  . History of recurrent UTIs   . OA (osteoarthritis)    knees, lower back  . Permanent atrial fibrillation [name]) cardiologist--- dr Agustin Cree   first dx 10/ 2011--- histroy DCCV 11-25-2010 by dr Wynonia Lawman (pt's previous cardiologist) and Cardiac cath 12-17-2010 no significant disease  . Scoliosis   . Stress incontinence in female     Past Surgical History:  Procedure Laterality Date  . BLEPHAROPLASTY Bilateral 02-22-2010  dr Georgia Lopes   upper eyelid's  . BUNIONECTOMY Right 2003  . CATARACT EXTRACTION W/ INTRAOCULAR LENS  IMPLANT, BILATERAL  2018  . CHOLECYSTECTOMY N/A 08/22/2019   Procedure: LAPAROSCOPIC CHOLECYSTECTOMY;  Surgeon: [name], [name], MD;  Location: [facility];  Service: General;  Laterality: N/A;  . KNEE ARTHROSCOPY Left 2002  . TUBAL LIGATION Bilateral 1980   AND RIGHT BREAST EXCISION CYST (BENIGN)    Allergies: Flecainide acetate  Medications: Prior to Admission medications   Medication Sig Start Date End Date Taking? Authorizing Provider  alendronate (FOSAMAX) 10 MG tablet TAKE (1) TABLET BY MOUTH PER WEEK TAKE ON EMPTY STOMACH WITH FULL GLASS OF WATER Patient taking differently: Take 10  mg by mouth daily before breakfast. TAKE (1) TABLET BY MOUTH PER WEEK TAKE ON EMPTY STOMACH WITH FULL GLASS OF WATER 05/02/19  Yes [name], MD  allopurinol (ZYLOPRIM) 100 MG tablet TAKE 1 TABLET BY MOUTH EVERY DAY Patient taking differently: Take 100 mg by mouth daily.  06/24/19  Yes [name], MD  calcium  carbonate (OSCAL) 1500 (600 Ca) MG TABS tablet Take 1,500 mg by mouth daily.    Yes [provider]  calcium carbonate (TUMS - DOSED IN MG ELEMENTAL CALCIUM) 500 MG chewable tablet Chew 1 tablet by mouth as needed for indigestion or heartburn.   Yes [provider]  cholecalciferol (VITAMIN D3) 25 MCG (1000 UT) tablet Take 1,000 Units by mouth daily.   Yes [provider]  CINNAMON PO Take 1 tablet by mouth daily.    Yes [provider]  clindamycin (CLINDAGEL) 1 % gel APPLY TO AFFECTED AREA TWICE A DAY Patient taking differently: Apply 1 application topically 2 (two) times daily.  04/13/18  Yes [name], MD  colchicine 0.6 MG tablet Take 1 tablet (0.6 mg total) by mouth 2 (two) times daily. Take twice daily until pain resolves and then take as needed for gout flare. Patient taking differently: Take 0.6 mg by mouth 2 (two) times daily as needed. Take twice daily until pain resolves and then take as needed for gout flare. 06/15/18  Yes [name], MD  [employer] ANTI-FUNGAL 2 % powder APPLY TO AFFECTED AREA TWICE A DAY AFTER SKIN RESOLVES FROM KETOCONAZOLE CREAM Patient taking differently: as needed.  10/17/14  Yes [name], MD  diazepam (VALIUM) 5 MG tablet TAKE 1/2 TABLET (2.5 MG TOTAL) BY MOUTH AT BEDTIME. Patient taking differently: Take 2.5 mg by mouth at bedtime.  07/08/19  Yes [name], MD  doxycycline (VIBRAMYCIN) 50 MG capsule TAKE 2 CAPSULES BY MOUTH 2 (TWO) TIMES DAILY. Patient taking differently: Take 100 mg by mouth See admin instructions. Twice daily As directed for deer tick's (allergic reaction) 04/25/18  Yes [name], MD  ELIQUIS 5 MG TABS tablet TAKE 1 TABLET BY MOUTH TWICE A DAY Patient taking differently: Take 5 mg by mouth 2 (two) times daily.  05/23/19  Yes Park Liter, MD  Flax Oil-Fish Oil-Borage Oil (FISH-FLAX-BORAGE) CAPS Take 1 capsule by mouth daily.   Yes [provider]  furosemide (LASIX) 20 MG  tablet Take 1 tablet (20 mg total) by mouth daily. Patient taking differently: Take 20 mg by mouth daily.  08/19/19  Yes Park Liter, MD  glucosamine-chondroitin (GLUCOSAMINE-CHONDROITIN DS) 500-400 MG tablet Take 1 tablet by mouth daily.    Yes [provider]  HYDROcodone-acetaminophen (NORCO/VICODIN) 5-325 MG tablet Take 1 tablet by mouth every 6 (six) hours as needed for moderate pain. 08/22/19  Yes [name], [name], MD  ibuprofen (ADVIL) 800 MG tablet Take 1 tablet (800 mg total) by mouth every 8 (eight) hours as needed. 08/22/19  Yes [name], [name], MD  lisinopril-hydrochlorothiazide (ZESTORETIC) 20-25 MG tablet Take 1 tablet by mouth daily. Patient taking differently: Take 1 tablet by mouth daily.  03/08/19  Yes [name], MD  loratadine (CLARITIN) 10 MG tablet Take 1 tablet (10 mg total) by mouth daily. 07/08/19  Yes [name], MD  metoprolol succinate (TOPROL-XL) 100 MG 24 hr tablet Take 1 tablet (100 mg total) by mouth daily. Take with or immediately following a meal. Patient taking differently: Take 100 mg by mouth daily. Take with or immediately  following a meal. 01/29/19 08/28/19 Yes Park Liter, MD  Multiple Vitamins-Minerals (EMERGEN-C IMMUNE) PACK Take 1 tablet by mouth daily.    Yes [provider]  Multiple Vitamins-Minerals (MULTIVITAMIN WOMEN 50+) TABS Take 1 tablet by mouth daily.    Yes [provider]  mupirocin ointment (BACTROBAN) 2 % APPLY TOPICALLY TO AFFECTED AREA TWICE DAILY Patient taking differently: as needed.  02/20/19  Yes [name], MD  Nutritional Supplements (ECHINACEA/GOLDEN SEAL PO) Take 1 tablet by mouth daily.    Yes [provider]  polycarbophil (FIBERCON) 625 MG tablet Take 625 mg by mouth daily.   Yes [provider]  potassium chloride (KLOR-CON) 20 MEQ packet MIX 1 PACKET IN LIQUID AND TAKE BY MOUTH DAILY AS DIRECTED Patient taking differently: Take 20 mEq by mouth  daily. MIX 1 PACKET IN LIQUID AND TAKE BY MOUTH DAILY AS DIRECTED 02/20/19  Yes [name], MD  Probiotic Product (PROBIOTIC-10 PO) Take 1 tablet by mouth daily.    Yes [provider]  TURMERIC PO Take 1 tablet by mouth daily.    Yes [provider]     Family History  Problem Relation Age of Onset  . Cancer Mother        ovarian  . Kidney disease Father   . Alcohol abuse Father   . Cancer Maternal Aunt        lung- smoker  . Heart disease Maternal Uncle     Social History   Socioeconomic History  . Marital status: Widowed    Spouse name: [name]  . Number of children: 1  . Years of education: Masters  . Highest education level: Master's degree (e.g., MA, MS, MEng, MEd, MSW, MBA)  Occupational History  . Occupation: Retired- Product manager: RETIRED  Social Needs  . Financial resource strain: Not hard at all  . Food insecurity    Worry: Never true    Inability: Never true  . Transportation needs    Medical: No    Non-medical: No  Tobacco Use  . Smoking status: Never Smoker  . Smokeless tobacco: Never Used  Substance and Sexual Activity  . Alcohol use: Yes    Comment: rarely  . Drug use: Never  . Sexual activity: Not Currently    Birth control/protection: Post-menopausal  Lifestyle  . Physical activity    Days per week: 0 days    Minutes per session: 0 min  . Stress: Not at all  Relationships  . Social Herbalist on phone: Three times a week    Gets together: Never    Attends religious service: Never    Active member of club or organization: Yes    Attends meetings of clubs or organizations: 1 to 4 times per year    Relationship status: Widowed  Other Topics Concern  . Not on file  Social History Narrative   Emergency Contact: Leta Baptist [phone]   Who lives with you: self   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 Exercise: Pt dances  regularly for shows   Seatbelts: Pt reports wearing seatbelt when in vehicles.    Sun Exposure/Protection: Pt reports not being in the sun very much   Hobbies: dancing, painting,       Working smoke alarm: yes      Home throw rugs: yes  Home free from clutter: yes                 Review of Systems: A 12 point ROS discussed and pertinent positives are indicated in the HPI above.  All other systems are negative.  Review of Systems  Constitutional: Positive for fever. Negative for fatigue.  Respiratory: Negative for cough and shortness of breath.   Cardiovascular: Negative for chest pain.  Gastrointestinal: Positive for abdominal pain. Negative for constipation, diarrhea, nausea and vomiting.  Genitourinary: Negative for dysuria.  Musculoskeletal: Negative for back pain.  Psychiatric/Behavioral: Negative for behavioral problems and confusion.    Vital Signs: BP 123/64   Pulse 71   Temp 98.3 F (36.8 C) (Oral)   Resp 17   Ht 5\' 2"  (1.575 m)   Wt 195 lb 5.2 oz (88.6 kg)   SpO2 95%   BMI 35.73 kg/m   Physical Exam Vitals signs and nursing note reviewed.  Constitutional:      Appearance: She is well-developed.  Cardiovascular:     Comments: Irregular irregular Pulmonary:     Effort: Pulmonary effort is normal. No respiratory distress.     Breath sounds: Normal breath sounds.  Abdominal:     Palpations: Abdomen is soft.     Tenderness: There is abdominal tenderness in the right upper quadrant.  Skin:    General: Skin is warm and dry.  Neurological:     General: No focal deficit present.     Mental Status: She is alert and oriented to person, place, and time.  Psychiatric:        Mood and Affect: Mood normal.        Behavior: Behavior normal.      MD Evaluation Airway: WNL Heart: WNL Abdomen: WNL Chest/ Lungs: WNL ASA  Classification: 3 Mallampati/Airway Score: Two   Imaging: Ct Abdomen Pelvis Wo Contrast  Result Date: 08/29/2019 CLINICAL  DATA:  Acute generalized abdominal pain. Status post cholecystectomy 08/22/2019. Patient not a pain medication. EXAM: CT ABDOMEN AND PELVIS WITHOUT CONTRAST TECHNIQUE: Multidetector CT imaging of the abdomen and pelvis was performed following the standard protocol without IV contrast. COMPARISON:  CT 08/10/2010, no interval CT FINDINGS: Lower chest: Small right and trace left pleural effusion. Bilateral lower lobe atelectasis. Compressive atelectasis in the right middle lobe related to elevated hemidiaphragm. Cardiomegaly. Hepatobiliary: Post cholecystectomy with fluid collection in the gallbladder fossa measuring 6.5 x 2.5 cm. Fluid measures simple density. Tiny dependent hyperdensities in the fluid collection, images 27 and 28 series 2 are nonspecific may represent drop stones. Small volume of simple perihepatic free fluid. Common bile duct is poorly defined, however measures approximately 10 mm. No visualized choledocholithiasis. Suggestion of slight nodular hepatic contours. No evidence of focal hepatic lesion. Pancreas: No ductal dilatation or inflammation. Spleen: Normal in size without focal abnormality. Adrenals/Urinary Tract: Normal adrenal glands. Extrarenal pelvis configuration of the right greater than left kidney. No renal stones. No perinephric edema. Urinary bladder is physiologically distended. No wall thickening. No bladder stone. Stomach/Bowel: Ingested contrast in the stomach. No gastric wall thickening. Administered enteric contrast reaches mid distal small bowel. No small bowel obstruction. Normal appendix. Moderate stool in the proximal colon. Small volume of stool in the more distal colon. Mild distal descending diverticulosis, prominent diverticulosis in the sigmoid. No evidence of diverticulitis. Vascular/Lymphatic: Mild aortic atherosclerosis. No aneurysm. Definite enlarged lymph nodes in the abdomen or pelvis. Reproductive: Atrophic uterus. No gross adnexal mass. Other: Small volume simple  free fluid in the pelvis.  Minimal fluid in the right pericolic gutter. No free air. Expected stranding in the subcutaneous tissues port sites. No subcutaneous fluid collection. Musculoskeletal: There are no acute or suspicious osseous abnormalities. Scoliosis and degenerative change in the spine. IMPRESSION: 1. Post recent cholecystectomy with fluid collection in the gallbladder fossa measuring 6.5 x 2.5 cm, may be postoperative seroma or biloma. Tiny dependent hyperdensities within the fluid collection are nonspecific, may represent drop stones or potentially surgical packing. Consider nuclear medicine hepatic biliary scan if there is concern for biloma. 2. Prominent common bile duct at 10 mm, may be normal post cholecystectomy. 3. Additional free fluid about the liver and tracking into the pelvis measures simple fluid density. 4. Small right pleural effusion.  Bibasilar atelectasis. 5. Colonic diverticulosis without diverticulitis. Aortic Atherosclerosis (ICD10-I70.0). Electronically Signed   By: Keith Rake M.D.   On: 08/29/2019 02:36   Dg Chest Port 1 View  Result Date: 08/28/2019 CLINICAL DATA:  CHF, shortness of breath EXAM: PORTABLE CHEST 1 VIEW COMPARISON:  None. FINDINGS: There is mild cardiomegaly. Pulmonary vascular congestion is seen. There is mildly increased interstitial markings throughout both lungs. There is blunting of the left costophrenic angle which could be due to a trace left pleural effusion. No acute osseous abnormality. IMPRESSION: Mild cardiomegaly and interstitial edema. Probable trace left pleural effusion. Electronically Signed   By: Prudencio Pair M.D.   On: 08/28/2019 22:34   Nm Hepato Biliary Leak  Addendum Date: 08/29/2019   ADDENDUM REPORT: 08/29/2019 11:46 ADDENDUM: These results were called by telephone at the time of interpretation on 08/29/2019 at 11:46 am to provider Dr. Marlou Starks, who verbally acknowledged these results. Electronically Signed   By: Zetta Bills M.D.    On: 08/29/2019 11:46   Result Date: 08/29/2019 CLINICAL DATA:  History of suspected bile leak. EXAM: NUCLEAR MEDICINE HEPATOBILIARY IMAGING TECHNIQUE: Sequential images of the abdomen were obtained out to 60 minutes following intravenous administration of radiopharmaceutical. RADIOPHARMACEUTICALS:  5.1 mCi Tc-96m  Choletec IV COMPARISON:  CT study of 08/29/2019 FINDINGS: Prompt uptake of radiotracer into hepatic parenchyma with excretion into the biliary tree is noted. An amorphous collection of radiotracer is demonstrated, enlarging over time, over the right upper quadrant extending into the expected area of gallbladder fossa and porta hepatis. Likely tracking up over the liver as well. IMPRESSION: Nuclear medicine signs of biliary leak. No signs of small bowel activity. A call is out to the referring provider to discuss findings in the above case. Electronically Signed: By: Zetta Bills M.D. On: 08/29/2019 11:39    Labs:  CBC: Recent Labs    08/22/19 0649 08/28/19 1655 08/29/19 0526 08/30/19 0206  WBC  --  12.1* 10.3 12.2*  HGB 13.6 11.9* 11.1* 11.2*  HCT 40.0 35.9* 34.1* 34.9*  PLT  --  301 278 286    COAGS: Recent Labs    08/29/19 1805  INR 1.1    BMP: Recent Labs    08/28/19 1655 08/29/19 0526 08/29/19 1508 08/30/19 0206  NA 140 135 138 138  K 4.8 3.9 4.9 3.2*  CL 100 99 97* 101  CO2 26 24 28 24   GLUCOSE 118* 105* 110* 99  BUN 46* 40* 36* 34*  CALCIUM 10.4* 9.2 9.5 8.6*  CREATININE 3.79* 3.33* 3.01* 2.57*  GFRNONAA 11* 13* 14* 17*  GFRAA 12* 14* 16* 20*    LIVER FUNCTION TESTS: Recent Labs    08/28/19 1655 08/29/19 0526 08/30/19 0206  BILITOT 1.1 1.4* 2.1*  AST 25 22 20  ALT 16 15 13   ALKPHOS 80 78 79  PROT 7.8 6.8 6.2*  ALBUMIN 3.6 3.2* 2.8*    TUMOR MARKERS: No results for input(s): AFPTM, CEA, CA199, CHROMGRNA in the last 8760 hours.  Assessment and Plan: Biloma Bile leak Patient with RUQ pain after recent cholecystectomy.  HIDA scan  consistent with biloma.  IR and GI consulted for management.  Plan made for cholecystostomy tube placement today by Dr. Laurence Ferrari.  GI then to take patient for ERCP/stent.  She is currently on Zosyn.  WBC 12.2 Tmax 100.6 Vital signs otherwise stable.  Proceed with drain placement in CT today.   Risks and benefits discussed with the patient including, but not limited to bleeding, infection, gallbladder perforation, bile leak, sepsis or even death.  All of the patient's questions were answered, patient is agreeable to proceed. Consent signed and in chart.  Thank you for this interesting consult.  I greatly enjoyed meeting Gary Bultman Versteeg and look forward to participating in their care.  A copy of this report was sent to the requesting provider on this date.  Electronically Signed: Docia Barrier, PA 08/30/2019, 9:25 AM   I spent a total of 40 Minutes    in face to face in clinical consultation, greater than 50% of which was counseling/coordinating care for biloma.

## 2019-08-30 NOTE — Procedures (Signed)
Interventional Radiology Procedure Note  Procedure: Placement of 28F drain into biloma in GB fossa.  Aspiration yields several hundred mL bile.   Complications: None  Estimated Blood Loss: None  Recommendations: - Tube to JP bulb - Flush Q shift - ERCP for diverting biliary stent placement  Signed,  Criselda Peaches, MD

## 2019-08-30 NOTE — Progress Notes (Signed)
[facility] Progress Note     Subjective: CC-  Mostly concerned about getting her phone charged so someone can help her take care of her 20 cats that are home alone. Feels about the same as yesterday. Continues to have RUQ pain. Denies n/v. IR to place drain today.  Objective: Vital signs in last 24 hours: Temp:  [98.3 F (36.8 C)-100.6 F (38.1 C)] 98.3 F (36.8 C) (10/23 0800) Pulse Rate:  [65-127] 71 (10/23 0700) Resp:  [14-24] 17 (10/23 0700) BP: (97-173)/(56-112) 123/64 (10/23 0700) SpO2:  [92 %-97 %] 95 % (10/23 0700) Weight:  [88.4 kg-88.6 kg] 88.6 kg (10/23 0500) Last BM Date: 08/29/19  Intake/Output from previous day: 10/22 0701 - 10/23 0700 In: 1009.4 [I.V.:959.4; IV Piggyback:50] Out: 2050 [Urine:2050] Intake/Output this shift: No intake/output data recorded.  PE: Gen:  Alert, NAD HEENT: EOM's intact, pupils equal and round Cardio: regular rate Pulm:  CTAB, no W/R/R, rate and effort normal Abd: Soft, mild distension, +BS, lap incisions cdi/ proximal most incision with some surrounding ecchymosis, mild TTP RUQ without rebound or guarding Skin: warm and dry  Lab Results:  Recent Labs    08/29/19 0526 08/30/19 0206  WBC 10.3 12.2*  HGB 11.1* 11.2*  HCT 34.1* 34.9*  PLT 278 286   BMET Recent Labs    08/29/19 1508 08/30/19 0206  NA 138 138  K 4.9 3.2*  CL 97* 101  CO2 28 24  GLUCOSE 110* 99  BUN 36* 34*  CREATININE 3.01* 2.57*  CALCIUM 9.5 8.6*   PT/INR Recent Labs    08/29/19 1805  LABPROT 13.7  INR 1.1   CMP     Component Value Date/Time   NA 138 08/30/2019 0206   K 3.2 (L) 08/30/2019 0206   CL 101 08/30/2019 0206   CO2 24 08/30/2019 0206   GLUCOSE 99 08/30/2019 0206   BUN 34 (H) 08/30/2019 0206   CREATININE 2.57 (H) 08/30/2019 0206   CREATININE 1.19 (H) 10/27/2016 1215   CALCIUM 8.6 (L) 08/30/2019 0206   PROT 6.2 (L) 08/30/2019 0206   ALBUMIN 2.8 (L) 08/30/2019 0206   AST 20 08/30/2019 0206   ALT 13 08/30/2019  0206   ALKPHOS 79 08/30/2019 0206   BILITOT 2.1 (H) 08/30/2019 0206   GFRNONAA 17 (L) 08/30/2019 0206   GFRNONAA 44 (L) 10/27/2016 1215   GFRAA 20 (L) 08/30/2019 0206   GFRAA 51 (L) 10/27/2016 1215   Lipase     Component Value Date/Time   LIPASE 42 08/28/2019 1655       Studies/Results: Ct Abdomen Pelvis Wo Contrast  Result Date: 08/29/2019 CLINICAL DATA:  Acute generalized abdominal pain. Status post cholecystectomy 08/22/2019. Patient not a pain medication. EXAM: CT ABDOMEN AND PELVIS WITHOUT CONTRAST TECHNIQUE: Multidetector CT imaging of the abdomen and pelvis was performed following the standard protocol without IV contrast. COMPARISON:  CT 08/10/2010, no interval CT FINDINGS: Lower chest: Small right and trace left pleural effusion. Bilateral lower lobe atelectasis. Compressive atelectasis in the right middle lobe related to elevated hemidiaphragm. Cardiomegaly. Hepatobiliary: Post cholecystectomy with fluid collection in the gallbladder fossa measuring 6.5 x 2.5 cm. Fluid measures simple density. Tiny dependent hyperdensities in the fluid collection, images 27 and 28 series 2 are nonspecific may represent drop stones. Small volume of simple perihepatic free fluid. Common bile duct is poorly defined, however measures approximately 10 mm. No visualized choledocholithiasis. Suggestion of slight nodular hepatic contours. No evidence of focal hepatic lesion. Pancreas: No ductal dilatation or inflammation.  Spleen: Normal in size without focal abnormality. Adrenals/Urinary Tract: Normal adrenal glands. Extrarenal pelvis configuration of the right greater than left kidney. No renal stones. No perinephric edema. Urinary bladder is physiologically distended. No wall thickening. No bladder stone. Stomach/Bowel: Ingested contrast in the stomach. No gastric wall thickening. Administered enteric contrast reaches mid distal small bowel. No small bowel obstruction. Normal appendix. Moderate stool in the  proximal colon. Small volume of stool in the more distal colon. Mild distal descending diverticulosis, prominent diverticulosis in the sigmoid. No evidence of diverticulitis. Vascular/Lymphatic: Mild aortic atherosclerosis. No aneurysm. Definite enlarged lymph nodes in the abdomen or pelvis. Reproductive: Atrophic uterus. No gross adnexal mass. Other: Small volume simple free fluid in the pelvis. Minimal fluid in the right pericolic gutter. No free air. Expected stranding in the subcutaneous tissues port sites. No subcutaneous fluid collection. Musculoskeletal: There are no acute or suspicious osseous abnormalities. Scoliosis and degenerative change in the spine. IMPRESSION: 1. Post recent cholecystectomy with fluid collection in the gallbladder fossa measuring 6.5 x 2.5 cm, may be postoperative seroma or biloma. Tiny dependent hyperdensities within the fluid collection are nonspecific, may represent drop stones or potentially surgical packing. Consider nuclear medicine hepatic biliary scan if there is concern for biloma. 2. Prominent common bile duct at 10 mm, may be normal post cholecystectomy. 3. Additional free fluid about the liver and tracking into the pelvis measures simple fluid density. 4. Small right pleural effusion.  Bibasilar atelectasis. 5. Colonic diverticulosis without diverticulitis. Aortic Atherosclerosis (ICD10-I70.0). Electronically Signed   By: Keith Rake M.D.   On: 08/29/2019 02:36   Dg Chest Port 1 View  Result Date: 08/28/2019 CLINICAL DATA:  CHF, shortness of breath EXAM: PORTABLE CHEST 1 VIEW COMPARISON:  None. FINDINGS: There is mild cardiomegaly. Pulmonary vascular congestion is seen. There is mildly increased interstitial markings throughout both lungs. There is blunting of the left costophrenic angle which could be due to a trace left pleural effusion. No acute osseous abnormality. IMPRESSION: Mild cardiomegaly and interstitial edema. Probable trace left pleural effusion.  Electronically Signed   By: Prudencio Pair M.D.   On: 08/28/2019 22:34   Nm Hepato Biliary Leak  Addendum Date: 08/29/2019   ADDENDUM REPORT: 08/29/2019 11:46 ADDENDUM: These results were called by telephone at the time of interpretation on 08/29/2019 at 11:46 am to provider Dr. Marlou Starks, who verbally acknowledged these results. Electronically Signed   By: Zetta Bills M.D.   On: 08/29/2019 11:46   Result Date: 08/29/2019 CLINICAL DATA:  History of suspected bile leak. EXAM: NUCLEAR MEDICINE HEPATOBILIARY IMAGING TECHNIQUE: Sequential images of the abdomen were obtained out to 60 minutes following intravenous administration of radiopharmaceutical. RADIOPHARMACEUTICALS:  5.1 mCi Tc-70m  Choletec IV COMPARISON:  CT study of 08/29/2019 FINDINGS: Prompt uptake of radiotracer into hepatic parenchyma with excretion into the biliary tree is noted. An amorphous collection of radiotracer is demonstrated, enlarging over time, over the right upper quadrant extending into the expected area of gallbladder fossa and porta hepatis. Likely tracking up over the liver as well. IMPRESSION: Nuclear medicine signs of biliary leak. No signs of small bowel activity. A call is out to the referring provider to discuss findings in the above case. Electronically Signed: By: Zetta Bills M.D. On: 08/29/2019 11:39    Anti-infectives: Anti-infectives (From admission, onward)   Start     Dose/Rate Route Frequency Ordered Stop   08/30/19 0400  piperacillin-tazobactam (ZOSYN) IVPB 2.25 g     2.25 g 100 mL/hr over 30 Minutes Intravenous  Every 8 hours 08/29/19 1747     08/29/19 1800  piperacillin-tazobactam (ZOSYN) IVPB 3.375 g     3.375 g 100 mL/hr over 30 Minutes Intravenous  Once 08/29/19 1745 08/29/19 2043       Assessment/Plan CHF HTN Gout AKI - Cr trending down 2.57 A fib with RVR - on cardizem drip. Hold eliquis  S/p laparoscopic cholecystectomy 08/22/19 Dr. Kieth Brightly Bile leak - HIDA 10/22 positive for bile  leak - IR to place perc drain today - GI following for possible ERCP/stent if needed  ID - zosyn 10/22>> FEN - IVF, NPO VTE - SCDs Foley - none Follow up - Dr. Kieth Brightly   LOS: 1 day    Wellington Hampshire, [facility] 08/30/2019, 8:48 AM Please see Amion for pager number during day hours 7:00am-4:30pm

## 2019-08-30 NOTE — Progress Notes (Addendum)
PROGRESS NOTE    Kristen [name]  EYC:[id number] DOB: 08-Sep-1941 DOA: 08/28/2019 PCP: [name], MD   Brief Narrative:  Patient is a 61 female with history of anxiety, A. fib on Eliquis, hypertension, CKD with uncertain baseline, who presents to the emergency room with abdominal pain.  Patient underwent laparoscopic cholecystectomy on 08/22/2019 and was recovering uneventfully back to home but started developing severe abdomen pain on right upper quadrant with radiation towards the right shoulder blade so she presented  to the emergency department.  In the emergency department she was found to be hypoxic, tachycardic.  Chest x-ray was concerning for interstitial edema.  BNP was elevated her blood work showed creatinine of 3.79 up from 1.3 a week earlier.  CT abdomen/pelvis revealed fluid collection in the gallbladder fossa.  General surgery consulted.  HIDA scan done today showed biliary leak and no activity in the small bowel.  While in the emergency department, she developed A. fib with RVR and had to be started on Cardizem drip. Underwent biliary drain today.  Assessment & Plan:   Principal Problem:   Acute renal failure (ARF) (HCC) Active Problems:   Hypertensive heart disease   Permanent atrial fibrillation (HCC)   Acute on chronic diastolic CHF (congestive heart failure) (HCC)   Situational mixed anxiety and depressive disorder   Intraabdominal fluid collection   A-fib (HCC)  Abdominal pain/biliary leak: HIDA scan was suggestive of biliary leak. presented with severe right upper quadrant pain.  History of cholecystectomy on October 15.  She had severe tenderness in the right upper quadrant.    CT scan showed fluid collection in the gallbladder fossa.  Underwent biliary drain by IR today with field of significant amount of biliary fluid.  GI, general surgery following.  Possible plan for ERCP. Abdominal pain was better this mrng.Continue zosyn.  Acute kidney injury: She was  also on ibuprofen and lisinopril at home.  AKI improving.  Continue gentle IV fluids  Acute on chronic diastolic CHF/acute hypoxic respiratory failure: Not on oxygen at home.  Elevated BNP.  Had peripheral edema.  Chest x-ray showed interstitial edema and she has bilateral basal crackles.  She was given few days of Lasix which has been stopped now.  Echocardiogram showed ejection fraction of 60-65%  A. fib with JSH:[id number] at least 5 (age x2, gender, CHF, HTN).  Went into A. fib in the emergency department.  Started on Cardizem drip with improvement .  On eliquis for anticoagulation which is on hold.  Continue metoprolol for rate control  Hypertension: Currently blood pressure stable.  Continue current medicines.         DVT prophylaxis:SCD Code Status: Full Family Communication: None present at the bedside Disposition Plan: Home after full work-up   Consultants: General surgery  Procedures: None  Antimicrobials:  Anti-infectives (From admission, onward)   Start     Dose/Rate Route Frequency Ordered Stop   08/30/19 0400  piperacillin-tazobactam (ZOSYN) IVPB 2.25 g     2.25 g 100 mL/hr over 30 Minutes Intravenous Every 8 hours 08/29/19 1747     08/29/19 1800  piperacillin-tazobactam (ZOSYN) IVPB 3.375 g     3.375 g 100 mL/hr over 30 Minutes Intravenous  Once 08/29/19 1745 08/29/19 2043      Subjective:  Patient seen and examined the bedside this morning.  Appears more comfortable today.  Abdomen pain is better.  Denies any nausea or vomiting.  Afebrile and hemodynamically stable this morning.  She was more concerned about  her  phone and was requesting me to get her charger.   Objective: Vitals:   08/30/19 1100 08/30/19 1110 08/30/19 1115 08/30/19 1120  BP: 119/73 (!) 142/93 (!) 136/91 (!) 153/83  Pulse: 73 78 84 92  Resp: 15 19 16 17   Temp:      TempSrc:      SpO2: 94% 95% 92% 93%  Weight:      Height:        Intake/Output Summary (Last 24 hours) at 08/30/2019  1130 Last data filed at 08/30/2019 1000 Gross per 24 hour  Intake 1252.02 ml  Output 2050 ml  Net -797.98 ml   Filed Weights   08/28/19 1650 08/29/19 1637 08/30/19 0500  Weight: 88 kg 88.4 kg 88.6 kg    Examination:  General exam: Not in distress, elderly female HEENT:PERRL,Oral mucosa dry, Ear/Nose normal on gross exam Respiratory system: Bilateral basal crackles Cardiovascular system: A. fib .  No JVD, murmurs, rubs, gallops or clicks.  Trace pedal edema. Gastrointestinal system: Abdomen is nondistended, soft.  Right upper quadrant tenderness.  No organomegaly or masses felt. Normal bowel sounds heard. Central nervous system: Alert and oriented. No focal neurological deficits. Extremities: trace bilateral lower extremity edema, no clubbing ,no cyanosis, distal peripheral pulses palpable. Skin: No rashes, lesions or ulcers,no icterus ,no pallor    Data Reviewed: I have personally reviewed following labs and imaging studies  CBC: Recent Labs  Lab 08/28/19 1655 08/29/19 0526 08/30/19 0206  WBC 12.1* 10.3 12.2*  NEUTROABS  --  7.2 8.9*  HGB 11.9* 11.1* 11.2*  HCT 35.9* 34.1* 34.9*  MCV 95.2 96.1 96.7  PLT 301 278 643   Basic Metabolic Panel: Recent Labs  Lab 08/28/19 1655 08/29/19 0526 08/29/19 1508 08/30/19 0206  NA 140 135 138 138  K 4.8 3.9 4.9 3.2*  CL 100 99 97* 101  CO2 26 24 28 24   GLUCOSE 118* 105* 110* 99  BUN 46* 40* 36* 34*  CREATININE 3.79* 3.33* 3.01* 2.57*  CALCIUM 10.4* 9.2 9.5 8.6*   GFR: Estimated Creatinine Clearance: 18.4 mL/min (A) (by C-G formula based on SCr of 2.57 mg/dL (H)). Liver Function Tests: Recent Labs  Lab 08/28/19 1655 08/29/19 0526 08/30/19 0206  AST 25 22 20   ALT 16 15 13   ALKPHOS 80 78 79  BILITOT 1.1 1.4* 2.1*  PROT 7.8 6.8 6.2*  ALBUMIN 3.6 3.2* 2.8*   Recent Labs  Lab 08/28/19 1655  LIPASE 42   No results for input(s): AMMONIA in the last 168 hours. Coagulation Profile: Recent Labs  Lab 08/29/19 1805   INR 1.1   Cardiac Enzymes: No results for input(s): CKTOTAL, CKMB, CKMBINDEX, TROPONINI in the last 168 hours. BNP (last 3 results) No results for input(s): PROBNP in the last 8760 hours. HbA1C: No results for input(s): HGBA1C in the last 72 hours. CBG: No results for input(s): GLUCAP in the last 168 hours. Lipid Profile: No results for input(s): CHOL, HDL, LDLCALC, TRIG, CHOLHDL, LDLDIRECT in the last 72 hours. Thyroid Function Tests: No results for input(s): TSH, T4TOTAL, FREET4, T3FREE, THYROIDAB in the last 72 hours. Anemia Panel: No results for input(s): VITAMINB12, FOLATE, FERRITIN, TIBC, IRON, RETICCTPCT in the last 72 hours. Sepsis Labs: No results for input(s): PROCALCITON, LATICACIDVEN in the last 168 hours.  Recent Results (from the past 240 hour(s))  SARS CORONAVIRUS 2 (TAT 6-24 HRS) Nasopharyngeal Nasopharyngeal Swab     Status: None   Collection Time: 08/28/19 11:25 PM   Specimen: Nasopharyngeal Swab  Result Value  Ref Range Status   SARS Coronavirus 2 NEGATIVE NEGATIVE Final    Comment: (NOTE) SARS-CoV-2 target nucleic acids are NOT DETECTED. The SARS-CoV-2 RNA is generally detectable in upper and lower respiratory specimens during the acute phase of infection. Negative results do not preclude SARS-CoV-2 infection, do not rule out co-infections with other pathogens, and should not be used as the sole basis for treatment or other patient management decisions. Negative results must be combined with clinical observations, patient history, and epidemiological information. The expected result is Negative. Fact Sheet for Patients: SugarRoll.be Fact Sheet for Healthcare Providers: https://www.woods-mathews.com/ This test is not yet approved or cleared by the [place] FDA and  has been authorized for detection and/or diagnosis of SARS-CoV-2 by FDA under an Emergency Use Authorization (EUA). This EUA will remain  in effect  (meaning this test can be used) for the duration of the COVID-19 declaration under Section 56 4(b)(1) of the Act, 21 U.S.C. section 360bbb-3(b)(1), unless the authorization is terminated or revoked sooner. Performed at [facility], [place] [address] [place], [place] [address]   MRSA PCR Screening     Status: None   Collection Time: 08/29/19  5:00 PM   Specimen: Nasal Mucosa; Nasopharyngeal  Result Value Ref Range Status   MRSA by PCR NEGATIVE NEGATIVE Final    Comment:        The GeneXpert MRSA Assay (FDA approved for NASAL specimens only), is one component of a comprehensive MRSA colonization surveillance program. It is not intended to diagnose MRSA infection nor to guide or monitor treatment for MRSA infections. Performed at [facility], [place] [address] [place], [place] [address]          Radiology Studies: Ct Abdomen Pelvis Wo Contrast  Result Date: 08/29/2019 CLINICAL DATA:  Acute generalized abdominal pain. Status post cholecystectomy 08/22/2019. Patient not a pain medication. EXAM: CT ABDOMEN AND PELVIS WITHOUT CONTRAST TECHNIQUE: Multidetector CT imaging of the abdomen and pelvis was performed following the standard protocol without IV contrast. COMPARISON:  CT 08/10/2010, no interval CT FINDINGS: Lower chest: Small right and trace left pleural effusion. Bilateral lower lobe atelectasis. Compressive atelectasis in the right middle lobe related to elevated hemidiaphragm. Cardiomegaly. Hepatobiliary: Post cholecystectomy with fluid collection in the gallbladder fossa measuring 6.5 x 2.5 cm. Fluid measures simple density. Tiny dependent hyperdensities in the fluid collection, images 27 and 28 series 2 are nonspecific may represent drop stones. Small volume of simple perihepatic free fluid. Common bile duct is poorly defined, however measures approximately 10 mm. No visualized choledocholithiasis. Suggestion of slight nodular hepatic contours. No  evidence of focal hepatic lesion. Pancreas: No ductal dilatation or inflammation. Spleen: Normal in size without focal abnormality. Adrenals/Urinary Tract: Normal adrenal glands. Extrarenal pelvis configuration of the right greater than left kidney. No renal stones. No perinephric edema. Urinary bladder is physiologically distended. No wall thickening. No bladder stone. Stomach/Bowel: Ingested contrast in the stomach. No gastric wall thickening. Administered enteric contrast reaches mid distal small bowel. No small bowel obstruction. Normal appendix. Moderate stool in the proximal colon. Small volume of stool in the more distal colon. Mild distal descending diverticulosis, prominent diverticulosis in the sigmoid. No evidence of diverticulitis. Vascular/Lymphatic: Mild aortic atherosclerosis. No aneurysm. Definite enlarged lymph nodes in the abdomen or pelvis. Reproductive: Atrophic uterus. No gross adnexal mass. Other: Small volume simple free fluid in the pelvis. Minimal fluid in the right pericolic gutter. No free air. Expected stranding in the subcutaneous tissues port sites. No subcutaneous  fluid collection. Musculoskeletal: There are no acute or suspicious osseous abnormalities. Scoliosis and degenerative change in the spine. IMPRESSION: 1. Post recent cholecystectomy with fluid collection in the gallbladder fossa measuring 6.5 x 2.5 cm, may be postoperative seroma or biloma. Tiny dependent hyperdensities within the fluid collection are nonspecific, may represent drop stones or potentially surgical packing. Consider nuclear medicine hepatic biliary scan if there is concern for biloma. 2. Prominent common bile duct at 10 mm, may be normal post cholecystectomy. 3. Additional free fluid about the liver and tracking into the pelvis measures simple fluid density. 4. Small right pleural effusion.  Bibasilar atelectasis. 5. Colonic diverticulosis without diverticulitis. Aortic Atherosclerosis (ICD10-I70.0).  Electronically Signed   By: Keith Rake M.D.   On: 08/29/2019 02:36   Dg Chest Port 1 View  Result Date: 08/28/2019 CLINICAL DATA:  CHF, shortness of breath EXAM: PORTABLE CHEST 1 VIEW COMPARISON:  None. FINDINGS: There is mild cardiomegaly. Pulmonary vascular congestion is seen. There is mildly increased interstitial markings throughout both lungs. There is blunting of the left costophrenic angle which could be due to a trace left pleural effusion. No acute osseous abnormality. IMPRESSION: Mild cardiomegaly and interstitial edema. Probable trace left pleural effusion. Electronically Signed   By: Prudencio Pair M.D.   On: 08/28/2019 22:34   Nm Hepato Biliary Leak  Addendum Date: 08/29/2019   ADDENDUM REPORT: 08/29/2019 11:46 ADDENDUM: These results were called by telephone at the time of interpretation on 08/29/2019 at 11:46 am to provider Dr. Marlou Starks, who verbally acknowledged these results. Electronically Signed   By: Zetta Bills M.D.   On: 08/29/2019 11:46   Result Date: 08/29/2019 CLINICAL DATA:  History of suspected bile leak. EXAM: NUCLEAR MEDICINE HEPATOBILIARY IMAGING TECHNIQUE: Sequential images of the abdomen were obtained out to 60 minutes following intravenous administration of radiopharmaceutical. RADIOPHARMACEUTICALS:  5.1 mCi Tc-74m  Choletec IV COMPARISON:  CT study of 08/29/2019 FINDINGS: Prompt uptake of radiotracer into hepatic parenchyma with excretion into the biliary tree is noted. An amorphous collection of radiotracer is demonstrated, enlarging over time, over the right upper quadrant extending into the expected area of gallbladder fossa and porta hepatis. Likely tracking up over the liver as well. IMPRESSION: Nuclear medicine signs of biliary leak. No signs of small bowel activity. A call is out to the referring provider to discuss findings in the above case. Electronically Signed: By: Zetta Bills M.D. On: 08/29/2019 11:39        Scheduled Meds: . allopurinol  100  mg Oral Daily  . Chlorhexidine Gluconate Cloth  6 each Topical Daily  . diazepam  2.5 mg Oral QHS  . fentaNYL      . HYDROmorphone      . mouth rinse  15 mL Mouth Rinse BID  . metoprolol succinate  100 mg Oral Daily  . midazolam      . sodium chloride flush  3 mL Intravenous Q12H  . sodium chloride flush  3 mL Intravenous Q12H   Continuous Infusions: . sodium chloride    . sodium chloride 75 mL/hr at 08/30/19 1000  . diltiazem (CARDIZEM) infusion 5 mg/hr (08/30/19 1000)  . piperacillin-tazobactam (ZOSYN)  IV Stopped (08/30/19 0344)     LOS: 1 day    Time spent: 25 mins.More than 50% of that time was spent in counseling and/or coordination of care.      Shelly Coss, MD [facility] Pager [phone]  If 7PM-7AM, please contact night-coverage [email or web address] Password [email or web address] 08/30/2019, 11:30 AM

## 2019-08-31 LAB — BASIC METABOLIC PANEL
Anion gap: 10 (ref 5–15)
BUN: 36 mg/dL — ABNORMAL HIGH (ref 8–23)
CO2: 26 mmol/L (ref 22–32)
Calcium: 8.2 mg/dL — ABNORMAL LOW (ref 8.9–10.3)
Chloride: 99 mmol/L (ref 98–111)
Creatinine, Ser: 2.18 mg/dL — ABNORMAL HIGH (ref 0.44–1.00)
GFR calc Af Amer: 24 mL/min — ABNORMAL LOW (ref >60)
GFR calc non Af Amer: 21 mL/min — ABNORMAL LOW (ref >60)
Glucose, Bld: 108 mg/dL — ABNORMAL HIGH (ref 70–99)
Potassium: 3.3 mmol/L — ABNORMAL LOW (ref 3.5–5.1)
Sodium: 135 mmol/L (ref 135–145)

## 2019-08-31 LAB — CBC WITH DIFFERENTIAL/PLATELET
Abs Immature Granulocytes: 0.13 10*3/uL — ABNORMAL HIGH (ref 0.00–0.07)
Basophils Absolute: 0 10*3/uL (ref 0.0–0.1)
Basophils Relative: 0 %
Eosinophils Absolute: 0.8 10*3/uL — ABNORMAL HIGH (ref 0.0–0.5)
Eosinophils Relative: 7 %
HCT: 31.8 % — ABNORMAL LOW (ref 36.0–46.0)
Hemoglobin: 10.1 g/dL — ABNORMAL LOW (ref 12.0–15.0)
Immature Granulocytes: 1 %
Lymphocytes Relative: 18 %
Lymphs Abs: 2 10*3/uL (ref 0.7–4.0)
MCH: 31.2 pg (ref 26.0–34.0)
MCHC: 31.8 g/dL (ref 30.0–36.0)
MCV: 98.1 fL (ref 80.0–100.0)
Monocytes Absolute: 1.3 10*3/uL — ABNORMAL HIGH (ref 0.1–1.0)
Monocytes Relative: 12 %
Neutro Abs: 6.8 10*3/uL (ref 1.7–7.7)
Neutrophils Relative %: 62 %
Platelets: 270 10*3/uL (ref 150–400)
RBC: 3.24 MIL/uL — ABNORMAL LOW (ref 3.87–5.11)
RDW: 13.2 % (ref 11.5–15.5)
WBC: 11.1 10*3/uL — ABNORMAL HIGH (ref 4.0–10.5)
nRBC: 0 % (ref 0.0–0.2)

## 2019-08-31 LAB — HEPARIN LEVEL (UNFRACTIONATED)
Heparin Unfractionated: <0.1 IU/mL — ABNORMAL LOW (ref 0.30–0.70)
Heparin Unfractionated: <0.1 IU/mL — ABNORMAL LOW (ref 0.30–0.70)

## 2019-08-31 LAB — PROTIME-INR
INR: 1.1 (ref 0.8–1.2)
Prothrombin Time: 13.8 seconds (ref 11.4–15.2)

## 2019-08-31 LAB — APTT: aPTT: 32 seconds (ref 24–36)

## 2019-08-31 MED ORDER — HEPARIN BOLUS VIA INFUSION
3000.0000 [IU] | Freq: Once | INTRAVENOUS | Status: AC
  Administered 2019-08-31: 13:00:00 3000 [IU] via INTRAVENOUS
  Filled 2019-08-31: qty 3000

## 2019-08-31 MED ORDER — POTASSIUM CHLORIDE CRYS ER 20 MEQ PO TBCR
40.0000 meq | EXTENDED_RELEASE_TABLET | Freq: Once | ORAL | Status: AC
  Administered 2019-08-31: 12:00:00 40 meq via ORAL
  Filled 2019-08-31 (×2): qty 2

## 2019-08-31 MED ORDER — PIPERACILLIN-TAZOBACTAM 3.375 G IVPB
3.3750 g | Freq: Three times a day (TID) | INTRAVENOUS | Status: DC
  Administered 2019-08-31 – 2019-09-08 (×24): 3.375 g via INTRAVENOUS
  Filled 2019-08-31 (×26): qty 50

## 2019-08-31 MED ORDER — HEPARIN (PORCINE) 25000 UT/250ML-% IV SOLN
1400.0000 [IU]/h | INTRAVENOUS | Status: DC
  Administered 2019-08-31: 13:00:00 1000 [IU]/h via INTRAVENOUS
  Administered 2019-09-01: 08:00:00 1200 [IU]/h via INTRAVENOUS
  Filled 2019-08-31 (×2): qty 250

## 2019-08-31 NOTE — Progress Notes (Signed)
Pharmacy Antibiotic Note  Kristen [name] is a 78 y.o. female admitted on 08/28/2019 with bile leak after lap chole.  Pharmacy has been consulted for zosyn dosing.  08/31/2019 Scr 2.18, CrCl ~ 21.68mls/min WBC 11.1  Plan: For improved renal function increase zosyn to 3.375g IV Q8H infused over 4hrs.  F/u renal fxn, WBC, temp, culture data  Height: 5\' 2"  (157.5 cm) Weight: 198 lb 10.2 oz (90.1 kg) IBW/kg (Calculated) : 50.1  Temp (24hrs), Avg:99.3 F (37.4 C), Min:98.2 F (36.8 C), Max:101.6 F (38.7 C)  Recent Labs  Lab 08/28/19 1655 08/29/19 0526 08/29/19 1508 08/30/19 0206 08/31/19 0204  WBC 12.1* 10.3  --  12.2* 11.1*  CREATININE 3.79* 3.33* 3.01* 2.57* 2.18*    Estimated Creatinine Clearance: 21.8 mL/min (A) (by C-G formula based on SCr of 2.18 mg/dL (H)).    Allergies  Allergen Reactions  . Flecainide Acetate Other (See Comments)    REACTION: SOB, swelling (hospitalized)    Thank you for allowing pharmacy to be a part of this patient's care.  Dolly Rias RPh 08/31/2019, 7:42 AM Pager [phone]

## 2019-08-31 NOTE — Progress Notes (Addendum)
[facility] Progress Note  Kristen [name] 78 y.o. May 07, 1941  CC: Bile leak   Subjective: Feeling better today.  Complaining of right upper quadrant and left upper quadrant discomfort.  Around 125 cc drain output this morning.  ROS : Afebrile this morning.  Negative for chest pain.  Complaining of mild shortness of breath   Objective: Vital signs in last 24 hours: Vitals:   08/31/19 0600 08/31/19 0815  BP: 94/79   Pulse: 98   Resp: 16   Temp:  98.1 F (36.7 C)  SpO2: 98%     Physical Exam:  General:  Alert, cooperative, no distress, appears stated age  Head:  Normocephalic, without obvious abnormality, atraumatic  Eyes:  , EOM's intact,   Lungs:    Fine basilar crackles, anterior exam only, no significant respiratory distress  Heart:   Irregularly irregular rate and rhythm,  Abdomen:    JP drain right upper quadrant, mild right upper quadrant and left upper quadrant tenderness to palpation, bowel sounds present.  No peritoneal signs  Extremities: Extremities normal, atraumatic, no  edema       Lab Results: Recent Labs    08/30/19 0206 08/31/19 0204  NA 138 135  K 3.2* 3.3*  CL 101 99  CO2 24 26  GLUCOSE 99 108*  BUN 34* 36*  CREATININE 2.57* 2.18*  CALCIUM 8.6* 8.2*   Recent Labs    08/29/19 0526 08/30/19 0206  AST 22 20  ALT 15 13  ALKPHOS 78 79  BILITOT 1.4* 2.1*  PROT 6.8 6.2*  ALBUMIN 3.2* 2.8*   Recent Labs    08/30/19 0206 08/31/19 0204  WBC 12.2* 11.1*  NEUTROABS 8.9* 6.8  HGB 11.2* 10.1*  HCT 34.9* 31.8*  MCV 96.7 98.1  PLT 286 270   Recent Labs    08/29/19 1805  LABPROT 13.7  INR 1.1      Assessment/Plan: -Bile leak s/p laparoscopic cholecystectomy.  Currently s/p IR guided drain placement. -History of A. fib.  Eliquis on hold -History of CHF.  Complaining of mild shortness of breath.  Recommendations ---------------------- -Monitor drain output.  Okay to start her on heparin drip for A. fib from GI  standpoint. -Advance diet as tolerated -Repeat LFTs in the morning. -May need ERCP probably in next few days if no improvement   Otis Brace MD, FACP 08/31/2019, 8:44 AM  Contact #  [phone]

## 2019-08-31 NOTE — Progress Notes (Signed)
Subjective/Chief Complaint: Comfortable with some RUQ pain around drain Tolerating po   Objective: Vital signs in last 24 hours: Temp:  [98.2 F (36.8 C)-101.6 F (38.7 C)] 98.2 F (36.8 C) (10/24 0337) Pulse Rate:  [59-109] 98 (10/24 0600) Resp:  [11-21] 16 (10/24 0600) BP: (85-153)/(42-93) 94/79 (10/24 0600) SpO2:  [92 %-98 %] 98 % (10/24 0600) Weight:  [90.1 kg] 90.1 kg (10/24 0500) Last BM Date: 08/29/19  Intake/Output from previous day: 10/23 0701 - 10/24 0700 In: 1802.1 [I.V.:1654.3; IV Piggyback:147.8] Out: 1025 [Urine:900; Drains:125] Intake/Output this shift: No intake/output data recorded.  Exam: Awake and alert Abdomen soft, drain with 125 cc output  Lab Results:  Recent Labs    08/30/19 0206 08/31/19 0204  WBC 12.2* 11.1*  HGB 11.2* 10.1*  HCT 34.9* 31.8*  PLT 286 270   BMET Recent Labs    08/30/19 0206 08/31/19 0204  NA 138 135  K 3.2* 3.3*  CL 101 99  CO2 24 26  GLUCOSE 99 108*  BUN 34* 36*  CREATININE 2.57* 2.18*  CALCIUM 8.6* 8.2*   PT/INR Recent Labs    08/29/19 1805  LABPROT 13.7  INR 1.1   ABG No results for input(s): PHART, HCO3 in the last 72 hours.  Invalid input(s): PCO2, PO2  Studies/Results: Ct Image Guided Drainage By Percutaneous Catheter  Result Date: 08/30/2019 INDICATION: 78 year old female with post cholecystectomy bile leak. She presents for CT-guided drain placement. EXAM: CT-guided drain placement MEDICATIONS: The patient is currently admitted to the hospital and receiving intravenous antibiotics. The antibiotics were administered within an appropriate time frame prior to the initiation of the procedure. ANESTHESIA/SEDATION: Fentanyl 100 mcg IV; Versed 4 mg IV, 1 mg Dilaudid Moderate Sedation Time:  34 minutes The patient was continuously monitored during the procedure by the interventional radiology nurse under my direct supervision. COMPLICATIONS: None immediate. PROCEDURE: Informed written consent was  obtained from the patient after a thorough discussion of the procedural risks, benefits and alternatives. All questions were addressed. A timeout was performed prior to the initiation of the procedure. A planning axial CT scan was performed. The fluid collection in the gallbladder fossa was successfully identified. A suitable skin entry site was selected and marked. Local anesthesia was attained by infiltration with 1% lidocaine. A small dermatotomy was made. Under intermittent CT guidance, an 18 gauge trocar needle was advanced into the fluid collection. A 0.035 wire was then advanced into the fluid collection. The needle was removed. The soft tissue tract was dilated to 10 [place] and a [place] all-purpose drainage catheter was advanced over the wire and formed in the fluid collection. Aspiration yields several 100 mL of bilious fluid. The catheter was secured in place with 0 Prolene suture and connected to JP bulb suction. Follow-up CT imaging demonstrates a well-positioned drainage catheter with significantly decreased fluid in the gallbladder fossa. However, some of the fluid has leaked into the subcutaneous soft tissues along the catheter tubing. IMPRESSION: Successful placement of 10 French drainage catheter into the gallbladder fossa with aspiration of several 100 mL bilious fluid. Electronically Signed   By: Jacqulynn Cadet M.D.   On: 08/30/2019 13:47   Nm Hepato Biliary Leak  Addendum Date: 08/29/2019   ADDENDUM REPORT: 08/29/2019 11:46 ADDENDUM: These results were called by telephone at the time of interpretation on 08/29/2019 at 11:46 am to provider Dr. Marlou Starks, who verbally acknowledged these results. Electronically Signed   By: Zetta Bills M.D.   On: 08/29/2019 11:46  Result Date: 08/29/2019 CLINICAL DATA:  History of suspected bile leak. EXAM: NUCLEAR MEDICINE HEPATOBILIARY IMAGING TECHNIQUE: Sequential images of the abdomen were obtained out to 60 minutes following intravenous  administration of radiopharmaceutical. RADIOPHARMACEUTICALS:  5.1 mCi Tc-99m  Choletec IV COMPARISON:  CT study of 08/29/2019 FINDINGS: Prompt uptake of radiotracer into hepatic parenchyma with excretion into the biliary tree is noted. An amorphous collection of radiotracer is demonstrated, enlarging over time, over the right upper quadrant extending into the expected area of gallbladder fossa and porta hepatis. Likely tracking up over the liver as well. IMPRESSION: Nuclear medicine signs of biliary leak. No signs of small bowel activity. A call is out to the referring provider to discuss findings in the above case. Electronically Signed: By: Zetta Bills M.D. On: 08/29/2019 11:39    Anti-infectives: Anti-infectives (From admission, onward)   Start     Dose/Rate Route Frequency Ordered Stop   08/31/19 1200  piperacillin-tazobactam (ZOSYN) IVPB 3.375 g     3.375 g 12.5 mL/hr over 240 Minutes Intravenous Every 8 hours 08/31/19 0743     08/30/19 0400  piperacillin-tazobactam (ZOSYN) IVPB 2.25 g  Status:  Discontinued     2.25 g 100 mL/hr over 30 Minutes Intravenous Every 8 hours 08/29/19 1747 08/31/19 0743   08/29/19 1800  piperacillin-tazobactam (ZOSYN) IVPB 3.375 g     3.375 g 100 mL/hr over 30 Minutes Intravenous  Once 08/29/19 1745 08/29/19 2043      Assessment/Plan: S/p lap chole with post op bile leak, now with IR drain.  WBC and Creatinine improving May still need ERCP and stent.  GI following Continuing antibiotics   LOS: 2 days    Coralie Keens 08/31/2019

## 2019-08-31 NOTE — Progress Notes (Signed)
PROGRESS NOTE    Kristen [name]  BOF:[id number] DOB: Apr 04, 1941 DOA: 08/28/2019 PCP: [name], MD   Brief Narrative:  Patient is a 64 female with history of anxiety, A. fib on Eliquis, hypertension, CKD with uncertain baseline, who presents to the emergency room with abdominal pain.  Patient underwent laparoscopic cholecystectomy on 08/22/2019 and was recovering uneventfully back to home but started developing severe abdomen pain on right upper quadrant with radiation towards the right shoulder blade so she presented  to the emergency department.  In the emergency department she was found to be hypoxic, tachycardic.  Chest x-ray was concerning for interstitial edema.  BNP was elevated her blood work showed creatinine of 3.79 up from 1.3 ,a week earlier.  CT abdomen/pelvis revealed fluid collection in the gallbladder fossa.  General surgery consulted.  HIDA scan done and it  showed biliary leak and no activity in the small bowel.  While in the emergency department, she also developed A. fib with RVR and had to be started on Cardizem drip. Underwent biliary drain by IR on 08/30/19.  Now the plan is to do an ERCP by GI.  Assessment & Plan:   Principal Problem:   Acute renal failure (ARF) (HCC) Active Problems:   Hypertensive heart disease   Permanent atrial fibrillation (HCC)   Acute on chronic diastolic CHF (congestive heart failure) (HCC)   Situational mixed anxiety and depressive disorder   Intraabdominal fluid collection   A-fib (HCC)  Abdominal pain/biliary leak: HIDA scan was suggestive of biliary leak. presented with severe right upper quadrant pain.  History of cholecystectomy on October 15.  She had severe tenderness in the right upper quadrant.    CT scan showed fluid collection in the gallbladder fossa.  Underwent biliary drain by IR with yield of significant amount of biliary fluid.  GI, general surgery following.  Plan  for ERCP. Abdominal pain was better this  mrng.Continue zosyn.  Acute kidney injury: She was also on ibuprofen and lisinopril at home.  AKI improving.  Continue gentle IV fluids.  Check BMP tomorrow.  Consider stopping fluid tomorrow with further improvement with kidney function.  Acute on chronic diastolic CHF/acute hypoxic respiratory failure: Not on oxygen at home.  Elevated BNP.  Had peripheral edema.  Chest x-ray showed interstitial edema and she had bilateral basal crackles.  She was given few days of Lasix which has been stopped now because she looked intravascularly depleted.  Kidney function actually  improved with IV fluids.  Echocardiogram showed ejection fraction of 60-65%  A. fib with DPO:EUMPN-TIRW at least 5 (age x2, gender, CHF, HTN).  Went into A. fib in the emergency department.  She was started  Cardizem drip with improvement ,now stopped.  On eliquis for anticoagulation which is on hold.  Continue metoprolol for rate control.I will start on heparin drip and continue until all procedures are done.  Hypertension: Currently blood pressure stable.  Continue current medicines.  Hypokalemia: Supplemented with potassium.         DVT prophylaxis:SCD Code Status: Full Family Communication: None present at the bedside Disposition Plan: Home after full work-up   Consultants: General surgery  Procedures: None  Antimicrobials:  Anti-infectives (From admission, onward)   Start     Dose/Rate Route Frequency Ordered Stop   08/31/19 1200  piperacillin-tazobactam (ZOSYN) IVPB 3.375 g     3.375 g 12.5 mL/hr over 240 Minutes Intravenous Every 8 hours 08/31/19 0743     08/30/19 0400  piperacillin-tazobactam (ZOSYN) IVPB 2.25  g  Status:  Discontinued     2.25 g 100 mL/hr over 30 Minutes Intravenous Every 8 hours 08/29/19 1747 08/31/19 0743   08/29/19 1800  piperacillin-tazobactam (ZOSYN) IVPB 3.375 g     3.375 g 100 mL/hr over 30 Minutes Intravenous  Once 08/29/19 1745 08/29/19 2043      Subjective:  Patient seen  and examined the bedside this morning.  Hemodynamically stable.  Comfortable.  No nausea or vomiting.  Abdominal pain much better today.  Denies any new complaints or concerns.  Started on diet.  Objective: Vitals:   08/31/19 0815 08/31/19 0900 08/31/19 0930 08/31/19 1000  BP:  (!) 85/50 (!) 99/42 (!) 115/51  Pulse:  (!) 143 (!) 130 88  Resp:  (!) 21  19  Temp: 98.1 F (36.7 C)     TempSrc: Oral     SpO2:  97%  96%  Weight:      Height:        Intake/Output Summary (Last 24 hours) at 08/31/2019 1106 Last data filed at 08/31/2019 0600 Gross per 24 hour  Intake 1569.47 ml  Output 1025 ml  Net 544.47 ml   Filed Weights   08/29/19 1637 08/30/19 0500 08/31/19 0500  Weight: 88.4 kg 88.6 kg 90.1 kg    Examination:   General exam: Not in distress, pleasant elderly female HEENT:PERRL,Oral mucosa moist, Ear/Nose normal on gross exam Respiratory system: Bilateral mild basal crackles otherwise mostly clear Cardiovascular system: A. fib no JVD, murmurs, rubs, gallops or clicks. Gastrointestinal system: Abdomen is nondistended, soft and mild tenderness on the right upper quadrant.  Biliary drain yielding sanguinous fluid.  Normal bowel sounds heard. Central nervous system: Alert and oriented. No focal neurological deficits. Extremities: No edema, no clubbing ,no cyanosis, distal peripheral pulses palpable. Skin: No rashes, lesions or ulcers,no icterus ,no pallor   Data Reviewed: I have personally reviewed following labs and imaging studies  CBC: Recent Labs  Lab 08/28/19 1655 08/29/19 0526 08/30/19 0206 08/31/19 0204  WBC 12.1* 10.3 12.2* 11.1*  NEUTROABS  --  7.2 8.9* 6.8  HGB 11.9* 11.1* 11.2* 10.1*  HCT 35.9* 34.1* 34.9* 31.8*  MCV 95.2 96.1 96.7 98.1  PLT 301 278 286 939   Basic Metabolic Panel: Recent Labs  Lab 08/28/19 1655 08/29/19 0526 08/29/19 1508 08/30/19 0206 08/31/19 0204  NA 140 135 138 138 135  K 4.8 3.9 4.9 3.2* 3.3*  CL 100 99 97* 101 99  CO2 26  24 28 24 26   GLUCOSE 118* 105* 110* 99 108*  BUN 46* 40* 36* 34* 36*  CREATININE 3.79* 3.33* 3.01* 2.57* 2.18*  CALCIUM 10.4* 9.2 9.5 8.6* 8.2*   GFR: Estimated Creatinine Clearance: 21.8 mL/min (A) (by C-G formula based on SCr of 2.18 mg/dL (H)). Liver Function Tests: Recent Labs  Lab 08/28/19 1655 08/29/19 0526 08/30/19 0206  AST 25 22 20   ALT 16 15 13   ALKPHOS 80 78 79  BILITOT 1.1 1.4* 2.1*  PROT 7.8 6.8 6.2*  ALBUMIN 3.6 3.2* 2.8*   Recent Labs  Lab 08/28/19 1655  LIPASE 42   No results for input(s): AMMONIA in the last 168 hours. Coagulation Profile: Recent Labs  Lab 08/29/19 1805  INR 1.1   Cardiac Enzymes: No results for input(s): CKTOTAL, CKMB, CKMBINDEX, TROPONINI in the last 168 hours. BNP (last 3 results) No results for input(s): PROBNP in the last 8760 hours. HbA1C: No results for input(s): HGBA1C in the last 72 hours. CBG: No results for input(s): GLUCAP  in the last 168 hours. Lipid Profile: No results for input(s): CHOL, HDL, LDLCALC, TRIG, CHOLHDL, LDLDIRECT in the last 72 hours. Thyroid Function Tests: No results for input(s): TSH, T4TOTAL, FREET4, T3FREE, THYROIDAB in the last 72 hours. Anemia Panel: No results for input(s): VITAMINB12, FOLATE, FERRITIN, TIBC, IRON, RETICCTPCT in the last 72 hours. Sepsis Labs: No results for input(s): PROCALCITON, LATICACIDVEN in the last 168 hours.  Recent Results (from the past 240 hour(s))  SARS CORONAVIRUS 2 (TAT 6-24 HRS) Nasopharyngeal Nasopharyngeal Swab     Status: None   Collection Time: 08/28/19 11:25 PM   Specimen: Nasopharyngeal Swab  Result Value Ref Range Status   SARS Coronavirus 2 NEGATIVE NEGATIVE Final    Comment: (NOTE) SARS-CoV-2 target nucleic acids are NOT DETECTED. The SARS-CoV-2 RNA is generally detectable in upper and lower respiratory specimens during the acute phase of infection. Negative results do not preclude SARS-CoV-2 infection, do not rule out co-infections with other  pathogens, and should not be used as the sole basis for treatment or other patient management decisions. Negative results must be combined with clinical observations, patient history, and epidemiological information. The expected result is Negative. Fact Sheet for Patients: SugarRoll.be Fact Sheet for Healthcare Providers: https://www.woods-mathews.com/ This test is not yet approved or cleared by the [place] FDA and  has been authorized for detection and/or diagnosis of SARS-CoV-2 by FDA under an Emergency Use Authorization (EUA). This EUA will remain  in effect (meaning this test can be used) for the duration of the COVID-19 declaration under Section 56 4(b)(1) of the Act, 21 U.S.C. section 360bbb-3(b)(1), unless the authorization is terminated or revoked sooner. Performed at [facility], [place] [address] [place], [place] [address]   MRSA PCR Screening     Status: None   Collection Time: 08/29/19  5:00 PM   Specimen: Nasal Mucosa; Nasopharyngeal  Result Value Ref Range Status   MRSA by PCR NEGATIVE NEGATIVE Final    Comment:        The GeneXpert MRSA Assay (FDA approved for NASAL specimens only), is one component of a comprehensive MRSA colonization surveillance program. It is not intended to diagnose MRSA infection nor to guide or monitor treatment for MRSA infections. Performed at [facility], [place] [address], [place], [place] [address]   Culture, blood (routine x 2)     Status: None (Preliminary result)   Collection Time: 08/30/19  8:10 AM   Specimen: BLOOD  Result Value Ref Range Status   Specimen Description   Final    BLOOD LEFT HAND Performed at [facility] [address], [place], [place] [address]    Special Requests   Final    BOTTLES DRAWN AEROBIC AND ANAEROBIC Blood Culture adequate volume Performed at [facility] [address],  [place], [place] [address]    Culture   Final    NO GROWTH < 24 HOURS Performed at [facility] [address] [place], [place] [address]    Report Status PENDING  Incomplete  Culture, blood (routine x 2)     Status: None (Preliminary result)   Collection Time: 08/30/19  8:21 AM   Specimen: BLOOD  Result Value Ref Range Status   Specimen Description   Final    BLOOD RIGHT HAND Performed at [facility] [address], [place], [place] [address]    Special Requests   Final    BOTTLES DRAWN AEROBIC ONLY Blood Culture results may  not be optimal due to an inadequate volume of blood received in culture bottles Performed at [facility] [address] [place], [place] [address]    Culture   Final    NO GROWTH < 24 HOURS Performed at [facility] [address] [place], [place] [address]    Report Status PENDING  Incomplete         Radiology Studies: Ct Image Guided Drainage By Percutaneous Catheter  Result Date: 08/30/2019 INDICATION: 78 year old female with post cholecystectomy bile leak. She presents for CT-guided drain placement. EXAM: CT-guided drain placement MEDICATIONS: The patient is currently admitted to the hospital and receiving intravenous antibiotics. The antibiotics were administered within an appropriate time frame prior to the initiation of the procedure. ANESTHESIA/SEDATION: Fentanyl 100 mcg IV; Versed 4 mg IV, 1 mg Dilaudid Moderate Sedation Time:  34 minutes The patient was continuously monitored during the procedure by the interventional radiology nurse under my direct supervision. COMPLICATIONS: None immediate. PROCEDURE: Informed written consent was obtained from the patient after a thorough discussion of the procedural risks, benefits and alternatives. All questions were addressed. A timeout was performed prior to the initiation of the procedure. A planning axial CT scan was performed. The fluid collection in the  gallbladder fossa was successfully identified. A suitable skin entry site was selected and marked. Local anesthesia was attained by infiltration with 1% lidocaine. A small dermatotomy was made. Under intermittent CT guidance, an 18 gauge trocar needle was advanced into the fluid collection. A 0.035 wire was then advanced into the fluid collection. The needle was removed. The soft tissue tract was dilated to 10 [place] and a [place] all-purpose drainage catheter was advanced over the wire and formed in the fluid collection. Aspiration yields several 100 mL of bilious fluid. The catheter was secured in place with 0 Prolene suture and connected to JP bulb suction. Follow-up CT imaging demonstrates a well-positioned drainage catheter with significantly decreased fluid in the gallbladder fossa. However, some of the fluid has leaked into the subcutaneous soft tissues along the catheter tubing. IMPRESSION: Successful placement of 10 French drainage catheter into the gallbladder fossa with aspiration of several 100 mL bilious fluid. Electronically Signed   By: Jacqulynn Cadet M.D.   On: 08/30/2019 13:47   Nm Hepato Biliary Leak  Addendum Date: 08/29/2019   ADDENDUM REPORT: 08/29/2019 11:46 ADDENDUM: These results were called by telephone at the time of interpretation on 08/29/2019 at 11:46 am to provider Dr. Marlou Starks, who verbally acknowledged these results. Electronically Signed   By: Zetta Bills M.D.   On: 08/29/2019 11:46   Result Date: 08/29/2019 CLINICAL DATA:  History of suspected bile leak. EXAM: NUCLEAR MEDICINE HEPATOBILIARY IMAGING TECHNIQUE: Sequential images of the abdomen were obtained out to 60 minutes following intravenous administration of radiopharmaceutical. RADIOPHARMACEUTICALS:  5.1 mCi Tc-85m  Choletec IV COMPARISON:  CT study of 08/29/2019 FINDINGS: Prompt uptake of radiotracer into hepatic parenchyma with excretion into the biliary tree is noted. An amorphous collection of radiotracer is  demonstrated, enlarging over time, over the right upper quadrant extending into the expected area of gallbladder fossa and porta hepatis. Likely tracking up over the liver as well. IMPRESSION: Nuclear medicine signs of biliary leak. No signs of small bowel activity. A call is out to the referring provider to discuss findings in the above case. Electronically Signed: By: Zetta Bills M.D. On: 08/29/2019 11:39        Scheduled Meds: . allopurinol  100 mg  Oral Daily  . Chlorhexidine Gluconate Cloth  6 each Topical Daily  . diazepam  2.5 mg Oral QHS  . mouth rinse  15 mL Mouth Rinse BID  . metoprolol succinate  100 mg Oral Daily  . sodium chloride flush  3 mL Intravenous Q12H  . sodium chloride flush  3 mL Intravenous Q12H  . sodium chloride flush  5 mL Intracatheter Q8H   Continuous Infusions: . sodium chloride    . sodium chloride 75 mL/hr at 08/31/19 0945  . piperacillin-tazobactam (ZOSYN)  IV       LOS: 2 days    Time spent: 25 mins.More than 50% of that time was spent in counseling and/or coordination of care.      Shelly Coss, MD [facility] Pager [phone]  If 7PM-7AM, please contact night-coverage [email or web address] Password [email or web address] 08/31/2019, 11:06 AM

## 2019-08-31 NOTE — Progress Notes (Signed)
ANTICOAGULATION CONSULT NOTE - Initial Consult  Pharmacy Consult for heparin Indication: atrial fibrillation  Allergies  Allergen Reactions  . Flecainide Acetate Other (See Comments)    REACTION: SOB, swelling (hospitalized)    Patient Measurements: Height: 5\' 2"  (157.5 cm) Weight: 198 lb 10.2 oz (90.1 kg) IBW/kg (Calculated) : 50.1 Heparin Dosing Weight: 70 kg  Vital Signs: Temp: 98.1 F (36.7 C) (10/24 1200) Temp Source: Oral (10/24 1200) BP: 109/71 (10/24 1200) Pulse Rate: 111 (10/24 1200)  Labs: Recent Labs    08/29/19 0526 08/29/19 1508 08/29/19 1805 08/30/19 0206 08/31/19 0204 08/31/19 1226  HGB 11.1*  --   --  11.2* 10.1*  --   HCT 34.1*  --   --  34.9* 31.8*  --   PLT 278  --   --  286 270  --   APTT  --   --   --   --   --  32  LABPROT  --   --  13.7  --   --  13.8  INR  --   --  1.1  --   --  1.1  CREATININE 3.33* 3.01*  --  2.57* 2.18*  --     Estimated Creatinine Clearance: 21.8 mL/min (A) (by C-G formula based on SCr of 2.18 mg/dL (H)).   Medical History: Past Medical History:  Diagnosis Date  . Anticoagulant long-term use    elquis -- managed by cardiology  . Anxiety   . Chronic calculous cholecystitis   . Chronic venous insufficiency    w/  varicose veins  . Depression   . Diastolic CHF, chronic ([place])    followed by cardiology  . Diverticulosis of colon   . Edema of both lower extremities    per pt mostly in summer time  . Essential hypertension, benign   . Fibrocystic breast disease   . Full dentures   . GERD (gastroesophageal reflux disease)    occasional tums and does not eat prior to bedtime  . Gout    08-19-2019  per pt last episode 07/ 2020  . Hiatal hernia   . History of diverticulitis 01/22/2016  . History of recurrent UTIs   . OA (osteoarthritis)    knees, lower back  . Permanent atrial fibrillation [facility]) cardiologist--- dr Agustin Cree   first dx 10/ 2011--- histroy DCCV 11-25-2010 by dr Wynonia Lawman (pt's previous cardiologist)  and Cardiac cath 12-17-2010 no significant disease  . Scoliosis   . Stress incontinence in female     Assessment: Pharmacy consulted to dose/monitor heparin in this 78 year old female. Pt takes apixaban PTA for atrial fibrillation, which has been held since admission on 10/21. Pt underwent lap chole on 08/22/19 and biliary drain placement by IR on 08/30/19. Planning for possible ERCP, has been cleared for anticoagulation with heparin drip by GI while awaiting procedure.   Baseline labs:  INR 1.1  HL = <0.10  APTT = 32 seconds  Hgb = 10.1, Plt 270  Today, 08/31/19  Hgb 10.1  Plt 270 - WNL  SCr 2.18, CrCl ~22 mL/min  Discussed with GI - ok to give heparin bolus  Since baseline HL is undetectable and patient has not had apixaban in the past 48 hours, will monitor using HL  Goal of Therapy:  Heparin level 0.3-0.7 units/ml Monitor platelets by anticoagulation protocol: Yes   Plan:   Give 3000 units bolus x 1  Start heparin infusion at 1000 units/hr  Check anti-Xa level in 8 hours and  daily while on heparin  Continue to monitor H&H and platelets  Monitor for signs/symptoms of bleeding or thrombosis  Lenis Noon, PharmD 08/31/2019,1:08 PM

## 2019-09-01 DIAGNOSIS — F4323 Adjustment disorder with mixed anxiety and depressed mood: Secondary | ICD-10-CM

## 2019-09-01 DIAGNOSIS — I4891 Unspecified atrial fibrillation: Secondary | ICD-10-CM

## 2019-09-01 LAB — COMPREHENSIVE METABOLIC PANEL
ALT: 14 U/L (ref 0–44)
AST: 25 U/L (ref 15–41)
Albumin: 2.7 g/dL — ABNORMAL LOW (ref 3.5–5.0)
Alkaline Phosphatase: 94 U/L (ref 38–126)
Anion gap: 11 (ref 5–15)
BUN: 31 mg/dL — ABNORMAL HIGH (ref 8–23)
CO2: 22 mmol/L (ref 22–32)
Calcium: 8.1 mg/dL — ABNORMAL LOW (ref 8.9–10.3)
Chloride: 102 mmol/L (ref 98–111)
Creatinine, Ser: 1.52 mg/dL — ABNORMAL HIGH (ref 0.44–1.00)
GFR calc Af Amer: 37 mL/min — ABNORMAL LOW (ref >60)
GFR calc non Af Amer: 32 mL/min — ABNORMAL LOW (ref >60)
Glucose, Bld: 93 mg/dL (ref 70–99)
Potassium: 3.6 mmol/L (ref 3.5–5.1)
Sodium: 135 mmol/L (ref 135–145)
Total Bilirubin: 1.2 mg/dL (ref 0.3–1.2)
Total Protein: 6 g/dL — ABNORMAL LOW (ref 6.5–8.1)

## 2019-09-01 LAB — CBC WITH DIFFERENTIAL/PLATELET
Abs Immature Granulocytes: 0.12 10*3/uL — ABNORMAL HIGH (ref 0.00–0.07)
Basophils Absolute: 0.1 10*3/uL (ref 0.0–0.1)
Basophils Relative: 1 %
Eosinophils Absolute: 1.1 10*3/uL — ABNORMAL HIGH (ref 0.0–0.5)
Eosinophils Relative: 10 %
HCT: 31.1 % — ABNORMAL LOW (ref 36.0–46.0)
Hemoglobin: 10.1 g/dL — ABNORMAL LOW (ref 12.0–15.0)
Immature Granulocytes: 1 %
Lymphocytes Relative: 28 %
Lymphs Abs: 3.1 10*3/uL (ref 0.7–4.0)
MCH: 31.6 pg (ref 26.0–34.0)
MCHC: 32.5 g/dL (ref 30.0–36.0)
MCV: 97.2 fL (ref 80.0–100.0)
Monocytes Absolute: 1.1 10*3/uL — ABNORMAL HIGH (ref 0.1–1.0)
Monocytes Relative: 10 %
Neutro Abs: 5.5 10*3/uL (ref 1.7–7.7)
Neutrophils Relative %: 50 %
Platelets: 281 10*3/uL (ref 150–400)
RBC: 3.2 MIL/uL — ABNORMAL LOW (ref 3.87–5.11)
RDW: 13.4 % (ref 11.5–15.5)
WBC: 10.9 10*3/uL — ABNORMAL HIGH (ref 4.0–10.5)
nRBC: 0 % (ref 0.0–0.2)

## 2019-09-01 LAB — HEPARIN LEVEL (UNFRACTIONATED)
Heparin Unfractionated: 0.16 IU/mL — ABNORMAL LOW (ref 0.30–0.70)
Heparin Unfractionated: 0.25 IU/mL — ABNORMAL LOW (ref 0.30–0.70)

## 2019-09-01 MED ORDER — HEPARIN BOLUS VIA INFUSION
2000.0000 [IU] | Freq: Once | INTRAVENOUS | Status: AC
  Administered 2019-09-01: 10:00:00 2000 [IU] via INTRAVENOUS
  Filled 2019-09-01: qty 2000

## 2019-09-01 MED ORDER — HEPARIN BOLUS VIA INFUSION
1500.0000 [IU] | Freq: Once | INTRAVENOUS | Status: AC
  Administered 2019-09-01: 20:00:00 1500 [IU] via INTRAVENOUS
  Filled 2019-09-01: qty 1500

## 2019-09-01 MED ORDER — HEPARIN (PORCINE) 25000 UT/250ML-% IV SOLN
1650.0000 [IU]/h | INTRAVENOUS | Status: DC
  Administered 2019-09-02: 16:00:00 1650 [IU]/h via INTRAVENOUS
  Administered 2019-09-02: 01:00:00 1550 [IU]/h via INTRAVENOUS
  Filled 2019-09-01 (×2): qty 250

## 2019-09-01 MED ORDER — HEPARIN BOLUS VIA INFUSION
1200.0000 [IU] | Freq: Once | INTRAVENOUS | Status: AC
  Administered 2019-09-01: 01:00:00 1200 [IU] via INTRAVENOUS
  Filled 2019-09-01: qty 1200

## 2019-09-01 NOTE — Progress Notes (Signed)
ANTICOAGULATION CONSULT NOTE - Follow Up Consult  Pharmacy Consult for Heparin2 Indication: atrial fibrillation  Allergies  Allergen Reactions  . Flecainide Acetate Other (See Comments)    REACTION: SOB, swelling (hospitalized)    Patient Measurements: Height: 5\' 2"  (157.5 cm) Weight: 198 lb 10.2 oz (90.1 kg) IBW/kg (Calculated) : 50.1 Heparin Dosing Weight:   Vital Signs: Temp: 99.5 F (37.5 C) (10/24 2300) Temp Source: Oral (10/24 2300) BP: 96/35 (10/25 0000) Pulse Rate: 94 (10/25 0000)  Labs: Recent Labs    08/29/19 0526 08/29/19 1508 08/29/19 1805 08/30/19 0206 08/31/19 0204 08/31/19 1226 08/31/19 2126  HGB 11.1*  --   --  11.2* 10.1*  --   --   HCT 34.1*  --   --  34.9* 31.8*  --   --   PLT 278  --   --  286 270  --   --   APTT  --   --   --   --   --  32  --   LABPROT  --   --  13.7  --   --  13.8  --   INR  --   --  1.1  --   --  1.1  --   HEPARINUNFRC  --   --   --   --   --  <0.10* <0.10*  CREATININE 3.33* 3.01*  --  2.57* 2.18*  --   --     Estimated Creatinine Clearance: 21.8 mL/min (A) (by C-G formula based on SCr of 2.18 mg/dL (H)).   Medications:  Infusions:  . sodium chloride    . heparin 1,200 Units/hr (09/01/19 0035)  . piperacillin-tazobactam (ZOSYN)  IV Stopped (08/31/19 2245)    Assessment: Patient with low heparin level again.  No heparin issues per RN.  Goal of Therapy:  Heparin level 0.3-0.7 units/ml Monitor platelets by anticoagulation protocol: Yes   Plan:  Heparin bolus 1200  units iv x1 Increase Heparin drip to 1200 units/hr Daily CBC Next heparin level at 0900    Tyler Deis, Shea Stakes [place] 09/01/2019,12:42 AM

## 2019-09-01 NOTE — Plan of Care (Signed)
  Problem: Clinical Measurements: Goal: Diagnostic test results will improve Outcome: Progressing Goal: Respiratory complications will improve Outcome: Progressing Goal: Cardiovascular complication will be avoided Outcome: Progressing   

## 2019-09-01 NOTE — Progress Notes (Signed)
PROGRESS NOTE    Kristen [name]  GGE:[id number] DOB: January 19, 1941 DOA: 08/28/2019 PCP: [name], MD   Brief Narrative:  Patient is a 78 female with history of anxiety, A. fib on Eliquis, hypertension, CKD with uncertain baseline, who presents to the emergency room with abdominal pain.  Patient underwent laparoscopic cholecystectomy on 08/22/2019 and was recovering uneventfully back to home but started developing severe abdomen pain on right upper quadrant with radiation towards the right shoulder blade so she presented  to the emergency department.  In the emergency department she was found to be hypoxic, tachycardic.  Chest x-ray was concerning for interstitial edema.  BNP was elevated her blood work showed creatinine of 3.79 up from 1.3 ,a week earlier.  CT abdomen/pelvis revealed fluid collection in the gallbladder fossa.  General surgery consulted.  HIDA scan done and it  showed biliary leak and no activity in the small bowel.  While in the emergency department, she also developed A. fib with RVR and had to be started on Cardizem drip. Underwent biliary drain by IR on 08/30/19.  Now the plan is to do an ERCP by GI.    Assessment & Plan:   Principal Problem:   Acute renal failure (ARF) (HCC) Active Problems:   Hypertensive heart disease   Permanent atrial fibrillation (HCC)   Acute on chronic diastolic CHF (congestive heart failure) (HCC)   Situational mixed anxiety and depressive disorder   Intraabdominal fluid collection   A-fib (HCC)   Abdominal pain/biliary leak: HIDA scan was suggestive of biliary leak. presented with severe right upper quadrant pain.  History of cholecystectomy on October 15.  She had severe tenderness in the right upper quadrant.    CT scan showed fluid collection in the gallbladder fossa.  Underwent biliary drain by IR with yield of significant amount of biliary fluid.  GI, general surgery following.  Plan  for ERCP tentatively Tuesday, appreciate GI  input, Abdominal pain remains improved this mrng.Continue zosyn.  Acute kidney injury: She was also on ibuprofen and lisinopril at home.  AKI improving.  Continue gentle IV fluids.  Check BMP tomorrow.  Consider stopping fluid tomorrow with further improvement with kidney function.  Acute on chronic diastolic CHF/acute hypoxic respiratory failure: Not on oxygen at home.  Elevated BNP.  Had peripheral edema.  Chest x-ray showed interstitial edema and she had bilateral basal crackles.  She was given few days of Lasix which has been stopped now because she looked intravascularly depleted.  Kidney function actually  improved with IV fluids.  Echocardiogram showed ejection fraction of 60-65%  A. fib with YYT:KPTWS-FKCL at least 5 (age x2, gender, CHF, HTN).  Went into A. fib in the emergency department.  She was started  Cardizem drip with improvement ,now stopped.  On eliquis for anticoagulation which is on hold.  Continue metoprolol for rate control continue heparin drip and continue until all procedures are done, with GI requesting to be held Monday morning  Hypertension: Currently blood pressure stable.  Continue current medicines.  Hypokalemia: Supplemented with potassium.  DVT prophylaxis: On:hep gtt  Code Status: full    Code Status Orders  (From admission, onward)         Start     Ordered   08/29/19 0425  Full code  Continuous     08/29/19 0424        Code Status History    This patient has a current code status but no historical code status.   Advance Care  Planning Activity    Advance Directive Documentation     Most Recent Value  Type of Advance Directive  Healthcare Power of Attorney, Living will  Pre-existing out of facility DNR order (yellow form or pink MOST form)  -  "MOST" Form in Place?  -     Family Communication: hep gtt  Disposition Plan:   Patient remained inpatient with plans ERCP on Tuesday when she will require continued subspecialty evaluation with GI  and general surgery, heparin drip.  Patient not medically stable for discharge Consults called: None Admission status: Inpatient   Consultants:   GI, gen surg  Procedures:  Ct Abdomen Pelvis Wo Contrast  Result Date: 08/29/2019 CLINICAL DATA:  Acute generalized abdominal pain. Status post cholecystectomy 08/22/2019. Patient not a pain medication. EXAM: CT ABDOMEN AND PELVIS WITHOUT CONTRAST TECHNIQUE: Multidetector CT imaging of the abdomen and pelvis was performed following the standard protocol without IV contrast. COMPARISON:  CT 08/10/2010, no interval CT FINDINGS: Lower chest: Small right and trace left pleural effusion. Bilateral lower lobe atelectasis. Compressive atelectasis in the right middle lobe related to elevated hemidiaphragm. Cardiomegaly. Hepatobiliary: Post cholecystectomy with fluid collection in the gallbladder fossa measuring 6.5 x 2.5 cm. Fluid measures simple density. Tiny dependent hyperdensities in the fluid collection, images 27 and 28 series 2 are nonspecific may represent drop stones. Small volume of simple perihepatic free fluid. Common bile duct is poorly defined, however measures approximately 10 mm. No visualized choledocholithiasis. Suggestion of slight nodular hepatic contours. No evidence of focal hepatic lesion. Pancreas: No ductal dilatation or inflammation. Spleen: Normal in size without focal abnormality. Adrenals/Urinary Tract: Normal adrenal glands. Extrarenal pelvis configuration of the right greater than left kidney. No renal stones. No perinephric edema. Urinary bladder is physiologically distended. No wall thickening. No bladder stone. Stomach/Bowel: Ingested contrast in the stomach. No gastric wall thickening. Administered enteric contrast reaches mid distal small bowel. No small bowel obstruction. Normal appendix. Moderate stool in the proximal colon. Small volume of stool in the more distal colon. Mild distal descending diverticulosis, prominent  diverticulosis in the sigmoid. No evidence of diverticulitis. Vascular/Lymphatic: Mild aortic atherosclerosis. No aneurysm. Definite enlarged lymph nodes in the abdomen or pelvis. Reproductive: Atrophic uterus. No gross adnexal mass. Other: Small volume simple free fluid in the pelvis. Minimal fluid in the right pericolic gutter. No free air. Expected stranding in the subcutaneous tissues port sites. No subcutaneous fluid collection. Musculoskeletal: There are no acute or suspicious osseous abnormalities. Scoliosis and degenerative change in the spine. IMPRESSION: 1. Post recent cholecystectomy with fluid collection in the gallbladder fossa measuring 6.5 x 2.5 cm, may be postoperative seroma or biloma. Tiny dependent hyperdensities within the fluid collection are nonspecific, may represent drop stones or potentially surgical packing. Consider nuclear medicine hepatic biliary scan if there is concern for biloma. 2. Prominent common bile duct at 10 mm, may be normal post cholecystectomy. 3. Additional free fluid about the liver and tracking into the pelvis measures simple fluid density. 4. Small right pleural effusion.  Bibasilar atelectasis. 5. Colonic diverticulosis without diverticulitis. Aortic Atherosclerosis (ICD10-I70.0). Electronically Signed   By: Keith Rake M.D.   On: 08/29/2019 02:36   Dg Chest Port 1 View  Result Date: 08/28/2019 CLINICAL DATA:  CHF, shortness of breath EXAM: PORTABLE CHEST 1 VIEW COMPARISON:  None. FINDINGS: There is mild cardiomegaly. Pulmonary vascular congestion is seen. There is mildly increased interstitial markings throughout both lungs. There is blunting of the left costophrenic angle which could be due to a  trace left pleural effusion. No acute osseous abnormality. IMPRESSION: Mild cardiomegaly and interstitial edema. Probable trace left pleural effusion. Electronically Signed   By: Prudencio Pair M.D.   On: 08/28/2019 22:34   Ct Image Guided Drainage By Percutaneous  Catheter  Result Date: 08/30/2019 INDICATION: 78 year old female with post cholecystectomy bile leak. She presents for CT-guided drain placement. EXAM: CT-guided drain placement MEDICATIONS: The patient is currently admitted to the hospital and receiving intravenous antibiotics. The antibiotics were administered within an appropriate time frame prior to the initiation of the procedure. ANESTHESIA/SEDATION: Fentanyl 100 mcg IV; Versed 4 mg IV, 1 mg Dilaudid Moderate Sedation Time:  34 minutes The patient was continuously monitored during the procedure by the interventional radiology nurse under my direct supervision. COMPLICATIONS: None immediate. PROCEDURE: Informed written consent was obtained from the patient after a thorough discussion of the procedural risks, benefits and alternatives. All questions were addressed. A timeout was performed prior to the initiation of the procedure. A planning axial CT scan was performed. The fluid collection in the gallbladder fossa was successfully identified. A suitable skin entry site was selected and marked. Local anesthesia was attained by infiltration with 1% lidocaine. A small dermatotomy was made. Under intermittent CT guidance, an 18 gauge trocar needle was advanced into the fluid collection. A 0.035 wire was then advanced into the fluid collection. The needle was removed. The soft tissue tract was dilated to 10 [place] and a [place] all-purpose drainage catheter was advanced over the wire and formed in the fluid collection. Aspiration yields several 100 mL of bilious fluid. The catheter was secured in place with 0 Prolene suture and connected to JP bulb suction. Follow-up CT imaging demonstrates a well-positioned drainage catheter with significantly decreased fluid in the gallbladder fossa. However, some of the fluid has leaked into the subcutaneous soft tissues along the catheter tubing. IMPRESSION: Successful placement of 10 French drainage catheter into the  gallbladder fossa with aspiration of several 100 mL bilious fluid. Electronically Signed   By: Jacqulynn Cadet M.D.   On: 08/30/2019 13:47   Nm Hepato Biliary Leak  Addendum Date: 08/29/2019   ADDENDUM REPORT: 08/29/2019 11:46 ADDENDUM: These results were called by telephone at the time of interpretation on 08/29/2019 at 11:46 am to provider Dr. Marlou Starks, who verbally acknowledged these results. Electronically Signed   By: Zetta Bills M.D.   On: 08/29/2019 11:46   Result Date: 08/29/2019 CLINICAL DATA:  History of suspected bile leak. EXAM: NUCLEAR MEDICINE HEPATOBILIARY IMAGING TECHNIQUE: Sequential images of the abdomen were obtained out to 60 minutes following intravenous administration of radiopharmaceutical. RADIOPHARMACEUTICALS:  5.1 mCi Tc-22m  Choletec IV COMPARISON:  CT study of 08/29/2019 FINDINGS: Prompt uptake of radiotracer into hepatic parenchyma with excretion into the biliary tree is noted. An amorphous collection of radiotracer is demonstrated, enlarging over time, over the right upper quadrant extending into the expected area of gallbladder fossa and porta hepatis. Likely tracking up over the liver as well. IMPRESSION: Nuclear medicine signs of biliary leak. No signs of small bowel activity. A call is out to the referring provider to discuss findings in the above case. Electronically Signed: By: Zetta Bills M.D. On: 08/29/2019 11:39     Antimicrobials:   Zosyn, 10/24   Subjective: Patient reports abdominal pain stable Ported minimal abdominal pain   Objective: Vitals:   09/01/19 0700 09/01/19 0800 09/01/19 1000 09/01/19 1006  BP:  (!) 112/50 (!) 106/48 (!) 119/49  Pulse:  100 84 80  Resp:  17 20 16   Temp: 99 F (37.2 C)     TempSrc: Oral     SpO2:  (!) 88% 92% 93%  Weight:      Height:        Intake/Output Summary (Last 24 hours) at 09/01/2019 1209 Last data filed at 09/01/2019 1000 Gross per 24 hour  Intake 1771.56 ml  Output 260 ml  Net 1511.56 ml    Filed Weights   08/30/19 0500 08/31/19 0500 09/01/19 0300  Weight: 88.6 kg 90.1 kg 92.1 kg    Examination:  General exam: Appears calm and comfortable  Respiratory system: Clear to auscultation. Respiratory effort normal. Cardiovascular system: S1 & S2 heard, RRR. No JVD, murmurs, rubs, gallops or clicks. No pedal edema. Gastrointestinal system: Abdomen is nondistended, soft and mildly tender. No organomegaly or masses felt. Normal bowel sounds heard. Central nervous system: Alert and oriented. No focal neurological deficits. Extremities: Warm well perfused, moves all 4 extremities freely, no contractures Skin: No rashes, lesions or ulcers Psychiatry: Judgement and insight appear normal. Mood & affect appropriate.     Data Reviewed: I have personally reviewed following labs and imaging studies  CBC: Recent Labs  Lab 08/28/19 1655 08/29/19 0526 08/30/19 0206 08/31/19 0204 09/01/19 0229  WBC 12.1* 10.3 12.2* 11.1* 10.9*  NEUTROABS  --  7.2 8.9* 6.8 5.5  HGB 11.9* 11.1* 11.2* 10.1* 10.1*  HCT 35.9* 34.1* 34.9* 31.8* 31.1*  MCV 95.2 96.1 96.7 98.1 97.2  PLT 301 278 286 270 024   Basic Metabolic Panel: Recent Labs  Lab 08/29/19 0526 08/29/19 1508 08/30/19 0206 08/31/19 0204 09/01/19 0229  NA 135 138 138 135 135  K 3.9 4.9 3.2* 3.3* 3.6  CL 99 97* 101 99 102  CO2 24 28 24 26 22   GLUCOSE 105* 110* 99 108* 93  BUN 40* 36* 34* 36* 31*  CREATININE 3.33* 3.01* 2.57* 2.18* 1.52*  CALCIUM 9.2 9.5 8.6* 8.2* 8.1*   GFR: Estimated Creatinine Clearance: 31.7 mL/min (A) (by C-G formula based on SCr of 1.52 mg/dL (H)). Liver Function Tests: Recent Labs  Lab 08/28/19 1655 08/29/19 0526 08/30/19 0206 09/01/19 0229  AST 25 22 20 25   ALT 16 15 13 14   ALKPHOS 80 78 79 94  BILITOT 1.1 1.4* 2.1* 1.2  PROT 7.8 6.8 6.2* 6.0*  ALBUMIN 3.6 3.2* 2.8* 2.7*   Recent Labs  Lab 08/28/19 1655  LIPASE 42   No results for input(s): AMMONIA in the last 168 hours. Coagulation  Profile: Recent Labs  Lab 08/29/19 1805 08/31/19 1226  INR 1.1 1.1   Cardiac Enzymes: No results for input(s): CKTOTAL, CKMB, CKMBINDEX, TROPONINI in the last 168 hours. BNP (last 3 results) No results for input(s): PROBNP in the last 8760 hours. HbA1C: No results for input(s): HGBA1C in the last 72 hours. CBG: No results for input(s): GLUCAP in the last 168 hours. Lipid Profile: No results for input(s): CHOL, HDL, LDLCALC, TRIG, CHOLHDL, LDLDIRECT in the last 72 hours. Thyroid Function Tests: No results for input(s): TSH, T4TOTAL, FREET4, T3FREE, THYROIDAB in the last 72 hours. Anemia Panel: No results for input(s): VITAMINB12, FOLATE, FERRITIN, TIBC, IRON, RETICCTPCT in the last 72 hours. Sepsis Labs: No results for input(s): PROCALCITON, LATICACIDVEN in the last 168 hours.  Recent Results (from the past 240 hour(s))  SARS CORONAVIRUS 2 (TAT 6-24 HRS) Nasopharyngeal Nasopharyngeal Swab     Status: None   Collection Time: 08/28/19 11:25 PM   Specimen: Nasopharyngeal Swab  Result Value Ref Range Status  SARS Coronavirus 2 NEGATIVE NEGATIVE Final    Comment: (NOTE) SARS-CoV-2 target nucleic acids are NOT DETECTED. The SARS-CoV-2 RNA is generally detectable in upper and lower respiratory specimens during the acute phase of infection. Negative results do not preclude SARS-CoV-2 infection, do not rule out co-infections with other pathogens, and should not be used as the sole basis for treatment or other patient management decisions. Negative results must be combined with clinical observations, patient history, and epidemiological information. The expected result is Negative. Fact Sheet for Patients: SugarRoll.be Fact Sheet for Healthcare Providers: https://www.woods-mathews.com/ This test is not yet approved or cleared by the [place] FDA and  has been authorized for detection and/or diagnosis of SARS-CoV-2 by FDA under an  Emergency Use Authorization (EUA). This EUA will remain  in effect (meaning this test can be used) for the duration of the COVID-19 declaration under Section 56 4(b)(1) of the Act, 21 U.S.C. section 360bbb-3(b)(1), unless the authorization is terminated or revoked sooner. Performed at [facility], [place] [address], [place], [place] [address]   MRSA PCR Screening     Status: None   Collection Time: 08/29/19  5:00 PM   Specimen: Nasal Mucosa; Nasopharyngeal  Result Value Ref Range Status   MRSA by PCR NEGATIVE NEGATIVE Final    Comment:        The GeneXpert MRSA Assay (FDA approved for NASAL specimens only), is one component of a comprehensive MRSA colonization surveillance program. It is not intended to diagnose MRSA infection nor to guide or monitor treatment for MRSA infections. Performed at [facility], [place] [address], [place], [place] [address]   Culture, blood (routine x 2)     Status: None (Preliminary result)   Collection Time: 08/30/19  8:10 AM   Specimen: BLOOD  Result Value Ref Range Status   Specimen Description   Final    BLOOD LEFT HAND Performed at [facility] [address] [place], [place] [address]    Special Requests   Final    BOTTLES DRAWN AEROBIC AND ANAEROBIC Blood Culture adequate volume Performed at [facility] [address], [place], [place] [address]    Culture   Final    NO GROWTH < 24 HOURS Performed at [facility] [address], [place], [place] [address]    Report Status PENDING  Incomplete  Culture, blood (routine x 2)     Status: None (Preliminary result)   Collection Time: 08/30/19  8:21 AM   Specimen: BLOOD  Result Value Ref Range Status   Specimen Description   Final    BLOOD RIGHT HAND Performed at [facility] [address] [place], [place] [address]    Special Requests   Final    BOTTLES DRAWN AEROBIC ONLY Blood Culture results  may not be optimal due to an inadequate volume of blood received in culture bottles Performed at [facility] [address], [place], [place] [address]    Culture   Final    NO GROWTH < 24 HOURS Performed at [facility] [address], [place], [place] [address]    Report Status PENDING  Incomplete         Radiology Studies: No results found.      Scheduled Meds: . allopurinol  100 mg Oral Daily  . Chlorhexidine Gluconate Cloth  6 each Topical Daily  . diazepam  2.5 mg Oral QHS  . mouth rinse  15 mL Mouth Rinse BID  . metoprolol succinate  100 mg Oral Daily  . sodium chloride flush  3 mL Intravenous Q12H  . sodium chloride flush  3 mL Intravenous Q12H  . sodium chloride flush  5 mL Intracatheter Q8H   Continuous Infusions: . sodium chloride    . heparin 1,400 Units/hr (09/01/19 0959)  . piperacillin-tazobactam (ZOSYN)  IV 3.375 g (09/01/19 1146)     LOS: 3 days    Time spent: 77 min    Nicolette Bang, MD [facility]  If 7PM-7AM, please contact night-coverage  09/01/2019, 12:09 PM

## 2019-09-01 NOTE — Progress Notes (Signed)
Subjective/Chief Complaint: No complaints Drain with 160 cc out last 24 hours   Objective: Vital signs in last 24 hours: Temp:  [98.1 F (36.7 C)-99.5 F (37.5 C)] 99 F (37.2 C) (10/25 0700) Pulse Rate:  [42-143] 91 (10/25 0400) Resp:  [17-25] 17 (10/25 0400) BP: (85-128)/(35-71) 104/47 (10/25 0400) SpO2:  [90 %-97 %] 91 % (10/25 0400) Weight:  [92.1 kg] 92.1 kg (10/25 0300) Last BM Date: 08/29/19  Intake/Output from previous day: 10/24 0701 - 10/25 0700 In: 2042.7 [P.O.:1340; I.V.:588.7; IV Piggyback:94] Out: 260 [Urine:100; Drains:160] Intake/Output this shift: No intake/output data recorded.  Exam: Awake in NAD Abdomen soft, minimally tender, drain with bile Lab Results:  Recent Labs    08/31/19 0204 09/01/19 0229  WBC 11.1* 10.9*  HGB 10.1* 10.1*  HCT 31.8* 31.1*  PLT 270 281   BMET Recent Labs    08/31/19 0204 09/01/19 0229  NA 135 135  K 3.3* 3.6  CL 99 102  CO2 26 22  GLUCOSE 108* 93  BUN 36* 31*  CREATININE 2.18* 1.52*  CALCIUM 8.2* 8.1*   PT/INR Recent Labs    08/29/19 1805 08/31/19 1226  LABPROT 13.7 13.8  INR 1.1 1.1   ABG No results for input(s): PHART, HCO3 in the last 72 hours.  Invalid input(s): PCO2, PO2  Studies/Results: Ct Image Guided Drainage By Percutaneous Catheter  Result Date: 08/30/2019 INDICATION: 78 year old female with post cholecystectomy bile leak. She presents for CT-guided drain placement. EXAM: CT-guided drain placement MEDICATIONS: The patient is currently admitted to the hospital and receiving intravenous antibiotics. The antibiotics were administered within an appropriate time frame prior to the initiation of the procedure. ANESTHESIA/SEDATION: Fentanyl 100 mcg IV; Versed 4 mg IV, 1 mg Dilaudid Moderate Sedation Time:  34 minutes The patient was continuously monitored during the procedure by the interventional radiology nurse under my direct supervision. COMPLICATIONS: None immediate. PROCEDURE: Informed  written consent was obtained from the patient after a thorough discussion of the procedural risks, benefits and alternatives. All questions were addressed. A timeout was performed prior to the initiation of the procedure. A planning axial CT scan was performed. The fluid collection in the gallbladder fossa was successfully identified. A suitable skin entry site was selected and marked. Local anesthesia was attained by infiltration with 1% lidocaine. A small dermatotomy was made. Under intermittent CT guidance, an 18 gauge trocar needle was advanced into the fluid collection. A 0.035 wire was then advanced into the fluid collection. The needle was removed. The soft tissue tract was dilated to 10 [place] and a [place] all-purpose drainage catheter was advanced over the wire and formed in the fluid collection. Aspiration yields several 100 mL of bilious fluid. The catheter was secured in place with 0 Prolene suture and connected to JP bulb suction. Follow-up CT imaging demonstrates a well-positioned drainage catheter with significantly decreased fluid in the gallbladder fossa. However, some of the fluid has leaked into the subcutaneous soft tissues along the catheter tubing. IMPRESSION: Successful placement of 10 French drainage catheter into the gallbladder fossa with aspiration of several 100 mL bilious fluid. Electronically Signed   By: Jacqulynn Cadet M.D.   On: 08/30/2019 13:47    Anti-infectives: Anti-infectives (From admission, onward)   Start     Dose/Rate Route Frequency Ordered Stop   08/31/19 1200  piperacillin-tazobactam (ZOSYN) IVPB 3.375 g     3.375 g 12.5 mL/hr over 240 Minutes Intravenous Every 8 hours 08/31/19 0743     08/30/19 0400  piperacillin-tazobactam (ZOSYN) IVPB 2.25 g  Status:  Discontinued     2.25 g 100 mL/hr over 30 Minutes Intravenous Every 8 hours 08/29/19 1747 08/31/19 0743   08/29/19 1800  piperacillin-tazobactam (ZOSYN) IVPB 3.375 g     3.375 g 100 mL/hr over 30  Minutes Intravenous  Once 08/29/19 1745 08/29/19 2043      Assessment/Plan: Bile leak s/p lap chole, now with IR placed perc drain  LFT"s normal Creatinine and WBC improving Drain output still mild to moderate.  GI making decision whether or not to place stent some time this week   LOS: 3 days    Coralie Keens MD 09/01/2019

## 2019-09-01 NOTE — Progress Notes (Signed)
[place] for heparin Indication: atrial fibrillation  Allergies  Allergen Reactions  . Flecainide Acetate Other (See Comments)    REACTION: SOB, swelling (hospitalized)    Patient Measurements: Height: 5\' 2"  (157.5 cm) Weight: 203 lb 0.7 oz (92.1 kg) IBW/kg (Calculated) : 50.1 Heparin Dosing Weight: 70 kg  Vital Signs: Temp: 97.5 F (36.4 C) (10/25 1619) Temp Source: Oral (10/25 1619) BP: 115/76 (10/25 1800) Pulse Rate: 81 (10/25 1800)  Labs: Recent Labs    08/30/19 0206 08/31/19 0204  08/31/19 1226 08/31/19 2126 09/01/19 0229 09/01/19 0833 09/01/19 1820  HGB 11.2* 10.1*  --   --   --  10.1*  --   --   HCT 34.9* 31.8*  --   --   --  31.1*  --   --   PLT 286 270  --   --   --  281  --   --   APTT  --   --   --  32  --   --   --   --   LABPROT  --   --   --  13.8  --   --   --   --   INR  --   --   --  1.1  --   --   --   --   HEPARINUNFRC  --   --    < > <0.10* <0.10*  --  0.16* 0.25*  CREATININE 2.57* 2.18*  --   --   --  1.52*  --   --    < > = values in this interval not displayed.    Estimated Creatinine Clearance: 31.7 mL/min (A) (by C-G formula based on SCr of 1.52 mg/dL (H)).   Assessment: Pharmacy consulted to dose/monitor heparin in this 78 year old female. Pt takes apixaban PTA for atrial fibrillation, which has been held since admission on 10/21. Pt underwent lap chole on 08/22/19 and biliary drain placement by IR on 08/30/19. Planning for possible ERCP, has been cleared for anticoagulation with heparin drip by GI while awaiting procedure.   Baseline labs:  INR 1.1 HL = <0.10  APTT = 32 seconds  Hgb = 10.1, Plt 270  Since baseline HL is undetectable and patient has not had apixaban in the past 48 hours, will monitor using HL  Today, 09/01/19   HL 0.25, subtherapeutic after 2000 unit bolus and heparin drip increase to 1400 units/hr  Hgb 10.1 Plt 281  SCr 1.52 , CrCl ~31 mL/min  Per RN no bleeding or  other issues  Goal of Therapy:  Heparin level 0.3-0.7 units/ml Monitor platelets by anticoagulation protocol: Yes   Plan:   Give 1500 units bolus x 1  Increase heparin drip to 1550 units/hr  Check heparin level in 8 hours  Continue to monitor H&H and platelets  Monitor for signs/symptoms of bleeding or thrombosis  Eudelia Bunch, Pharm.D [phone] 09/01/2019 7:34 PM

## 2019-09-01 NOTE — Progress Notes (Signed)
ANTICOAGULATION CONSULT NOTE - Initial Consult  Pharmacy Consult for heparin Indication: atrial fibrillation  Allergies  Allergen Reactions  . Flecainide Acetate Other (See Comments)    REACTION: SOB, swelling (hospitalized)    Patient Measurements: Height: 5\' 2"  (157.5 cm) Weight: 203 lb 0.7 oz (92.1 kg) IBW/kg (Calculated) : 50.1 Heparin Dosing Weight: 70 kg  Vital Signs: Temp: 99 F (37.2 C) (10/25 0700) Temp Source: Oral (10/25 0700) BP: 112/50 (10/25 0800) Pulse Rate: 100 (10/25 0800)  Labs: Recent Labs    08/29/19 1805  08/30/19 0206 08/31/19 0204 08/31/19 1226 08/31/19 2126 09/01/19 0229 09/01/19 0833  HGB  --    < > 11.2* 10.1*  --   --  10.1*  --   HCT  --   --  34.9* 31.8*  --   --  31.1*  --   PLT  --   --  286 270  --   --  281  --   APTT  --   --   --   --  32  --   --   --   LABPROT 13.7  --   --   --  13.8  --   --   --   INR 1.1  --   --   --  1.1  --   --   --   HEPARINUNFRC  --   --   --   --  <0.10* <0.10*  --  0.16*  CREATININE  --   --  2.57* 2.18*  --   --  1.52*  --    < > = values in this interval not displayed.    Estimated Creatinine Clearance: 31.7 mL/min (A) (by C-G formula based on SCr of 1.52 mg/dL (H)).   Medical History: Past Medical History:  Diagnosis Date  . Anticoagulant long-term use    elquis -- managed by cardiology  . Anxiety   . Chronic calculous cholecystitis   . Chronic venous insufficiency    w/  varicose veins  . Depression   . Diastolic CHF, chronic ([employer] [employer])    followed by cardiology  . Diverticulosis of colon   . Edema of both lower extremities    per pt mostly in summer time  . Essential hypertension, benign   . Fibrocystic breast disease   . Full dentures   . GERD (gastroesophageal reflux disease)    occasional tums and does not eat prior to bedtime  . Gout    08-19-2019  per pt last episode 07/ 2020  . Hiatal hernia   . History of diverticulitis 01/22/2016  . History of recurrent UTIs   . OA  (osteoarthritis)    knees, lower back  . Permanent atrial fibrillation [facility]) cardiologist--- dr Agustin Cree   first dx 10/ 2011--- histroy DCCV 11-25-2010 by dr Wynonia Lawman (pt's previous cardiologist) and Cardiac cath 12-17-2010 no significant disease  . Scoliosis   . Stress incontinence in female     Assessment: Pharmacy consulted to dose/monitor heparin in this 78 year old female. Pt takes apixaban PTA for atrial fibrillation, which has been held since admission on 10/21. Pt underwent lap chole on 08/22/19 and biliary drain placement by IR on 08/30/19. Planning for possible ERCP, has been cleared for anticoagulation with heparin drip by GI while awaiting procedure.   Baseline labs:  INR 1.1 HL = <0.10  APTT = 32 seconds  Hgb = 10.1, Plt 270  Since baseline HL is undetectable and patient has not had  apixaban in the past 48 hours, will monitor using HL  Today, 09/01/19   HL 0.16, subtherapeutic on 1200 units/hr  Hgb 10.1 Plt 281  SCr 1.52 , CrCl ~31 mL/min  Per RN no bleeding or other issues  Goal of Therapy:  Heparin level 0.3-0.7 units/ml Monitor platelets by anticoagulation protocol: Yes   Plan:   Give 2000 units bolus x 1  Increase heparin drip to 1400 units/hr  Check heparin level in 8 hours  Continue to monitor H&H and platelets  Monitor for signs/symptoms of bleeding or thrombosis  Dolly Rias RPh 09/01/2019, 9:35 AM Pager [phone]

## 2019-09-01 NOTE — Progress Notes (Signed)
[facility] Progress Note  Jashiya Bassett Schreckengost 78 y.o. [birth year]  CC: Bile leak   Subjective: No acute issues overnight.  Continues to have mild right upper quadrant discomfort.  Around 160 cc output over last 24 hours.  ROS : Afebrile this morning.  Negative for chest pain.  Denies any acute shortness of breath   Objective: Vital signs in last 24 hours: Vitals:   09/01/19 1000 09/01/19 1006  BP: (!) 106/48 (!) 119/49  Pulse: 84 80  Resp: 20 16  Temp:    SpO2: 92% 93%    Physical Exam:  General:  Alert, cooperative, no distress, appears stated age  Head:  Normocephalic, without obvious abnormality, atraumatic  Eyes:  , EOM's intact,   Lungs:    Fine basilar crackles, anterior exam only, no significant respiratory distress  Heart:   Irregularly irregular rate and rhythm,  Abdomen:    JP drain right upper quadrant, mild right upper quadrant and left upper quadrant tenderness to palpation, bowel sounds present.  No peritoneal signs  Extremities: Extremities normal, atraumatic, no  edema       Lab Results: Recent Labs    08/31/19 0204 09/01/19 0229  NA 135 135  K 3.3* 3.6  CL 99 102  CO2 26 22  GLUCOSE 108* 93  BUN 36* 31*  CREATININE 2.18* 1.52*  CALCIUM 8.2* 8.1*   Recent Labs    08/30/19 0206 09/01/19 0229  AST 20 25  ALT 13 14  ALKPHOS 79 94  BILITOT 2.1* 1.2  PROT 6.2* 6.0*  ALBUMIN 2.8* 2.7*   Recent Labs    08/31/19 0204 09/01/19 0229  WBC 11.1* 10.9*  NEUTROABS 6.8 5.5  HGB 10.1* 10.1*  HCT 31.8* 31.1*  MCV 98.1 97.2  PLT 270 281   Recent Labs    08/29/19 1805 08/31/19 1226  LABPROT 13.7 13.8  INR 1.1 1.1      Assessment/Plan: -Bile leak s/p laparoscopic cholecystectomy.  Currently s/p IR guided drain placement. -History of A. fib.  Eliquis on hold -History of CHF.  Complaining of mild shortness of breath.  Recommendations ---------------------- -Tentative plan for ERCP on Tuesday with Dr. Watt Climes  -Started on heparin  drip for A. fib.  Heparin drip to be hold on Monday morning. -Continue other management. -GI will follow   Otis Brace MD, [place] 09/01/2019, 11:14 AM  Contact #  [phone]

## 2019-09-02 LAB — HEPARIN LEVEL (UNFRACTIONATED)
Heparin Unfractionated: 0.32 IU/mL (ref 0.30–0.70)
Heparin Unfractionated: 0.41 IU/mL (ref 0.30–0.70)

## 2019-09-02 LAB — CBC
HCT: 31 % — ABNORMAL LOW (ref 36.0–46.0)
Hemoglobin: 10 g/dL — ABNORMAL LOW (ref 12.0–15.0)
MCH: 31.3 pg (ref 26.0–34.0)
MCHC: 32.3 g/dL (ref 30.0–36.0)
MCV: 96.9 fL (ref 80.0–100.0)
Platelets: 337 10*3/uL (ref 150–400)
RBC: 3.2 MIL/uL — ABNORMAL LOW (ref 3.87–5.11)
RDW: 13.3 % (ref 11.5–15.5)
WBC: 9.3 10*3/uL (ref 4.0–10.5)
nRBC: 0 % (ref 0.0–0.2)

## 2019-09-02 NOTE — Progress Notes (Signed)
PROGRESS NOTE    Kristen [name]  ZOX:[id number] DOB: 1940-12-06 DOA: 08/28/2019 PCP: [name], MD   Brief Narrative:  Patient is a 78 female with history of anxiety, A. fib on Eliquis, hypertension, CKD with uncertain baseline, who presents to the emergency room with abdominal pain. Patient underwent laparoscopic cholecystectomy on 08/22/2019 and was recovering uneventfully back to home but started developing severe abdomen pain on right upper quadrant with radiation towards the right shoulder blade so she presented to the emergency department. In the emergency department she was found to be hypoxic, tachycardic. Chest x-ray was concerning for interstitial edema. BNP was elevated her blood work showed creatinine of 3.79 up from 1.3 ,a week earlier. CT abdomen/pelvis revealed fluid collection in the gallbladder fossa. General surgery consulted. HIDA scan done and itshowed biliary leak and no activity in the small bowel. While in the emergency department, she alsodeveloped A. fib with RVR and had to be started on Cardizem drip. Underwent biliary drainby IR on 08/30/19.Now the plan is to do an ERCP by GI   Assessment & Plan:   Principal Problem:   Acute renal failure (ARF) (HCC) Active Problems:   Hypertensive heart disease   Permanent atrial fibrillation (HCC)   Acute on chronic diastolic CHF (congestive heart failure) (HCC)   Situational mixed anxiety and depressive disorder   Intraabdominal fluid collection   A-fib (HCC)   Abdominal pain/biliary leak: No changes, HIDA scan was suggestive of biliary leak. presented with severe right upper quadrant pain. History of cholecystectomy on October 15. She had severe tenderness in the right upper quadrant. CT scan showed fluid collection in the gallbladder fossa. Underwent biliary drain by IR with yield ofsignificant amount of biliary fluid. GI, general surgery following. Planfor ERCP tentatively Tuesday, appreciate  GI input,  hold heparin drip at 4 AM Tuesday morning, n.p.o. after midnight, Continue zosyn.  Acute kidney injury:She was also on ibuprofen and lisinopril at home. AKI improving. Continue gentle IV fluids. Monitor BMP. Consider stopping fluid tomorrow with further improvement with kidney function.  Acute on chronic diastolicCHF/acute hypoxic respiratory failure:Not on oxygen at home. Elevated BNP. Had peripheral edema. Chest x-ray showed interstitial edema and she hadbilateral basal crackles. She was given few days of Lasix which has been stopped now because shelooked intravascularly depleted. Kidney function actuallyimproved with IV fluids. Echocardiogram showed ejection fraction of 60-65%  A. fib with WJX:BJYNW-GNFA at least 5 (age x2, gender, CHF, HTN). Went into A. fib in the emergency department. She was startedCardizem drip with improvement ,now stopped. On eliquis for anticoagulation which is on hold. Continue metoprolol for rate control continue heparin drip andcontinue until all procedures are done, with GI requesting to be held Tuesday morning  Hypertension: Currently blood pressure stable. Continue current medicines.  Hypokalemia: Supplemented with potassium.  DVT prophylaxis: OZ:HYQMVHQ drip  Code Status: Full code    Code Status Orders  (From admission, onward)         Start     Ordered   08/29/19 0425  Full code  Continuous     08/29/19 0424        Code Status History    This patient has a current code status but no historical code status.   Advance Care Planning Activity    Advance Directive Documentation     Most Recent Value  Type of Advance Directive  Healthcare Power of Attorney, Living will  Pre-existing out of facility DNR order (yellow form or pink MOST form)  -  "  MOST" Form in Place?  -     Family Communication: Discussed in detail with patient Disposition Plan:   Patient remained inpatient, continue heparin drip, hold for  a.m. tomorrow for ERCP with possible stent placement.  Patient not medically stable for discharge Consults called: None Admission status: Inpatient   Consultants:   GI  Procedures:  Ct Abdomen Pelvis Wo Contrast  Result Date: 08/29/2019 CLINICAL DATA:  Acute generalized abdominal pain. Status post cholecystectomy 08/22/2019. Patient not a pain medication. EXAM: CT ABDOMEN AND PELVIS WITHOUT CONTRAST TECHNIQUE: Multidetector CT imaging of the abdomen and pelvis was performed following the standard protocol without IV contrast. COMPARISON:  CT 08/10/2010, no interval CT FINDINGS: Lower chest: Small right and trace left pleural effusion. Bilateral lower lobe atelectasis. Compressive atelectasis in the right middle lobe related to elevated hemidiaphragm. Cardiomegaly. Hepatobiliary: Post cholecystectomy with fluid collection in the gallbladder fossa measuring 6.5 x 2.5 cm. Fluid measures simple density. Tiny dependent hyperdensities in the fluid collection, images 27 and 28 series 2 are nonspecific may represent drop stones. Small volume of simple perihepatic free fluid. Common bile duct is poorly defined, however measures approximately 10 mm. No visualized choledocholithiasis. Suggestion of slight nodular hepatic contours. No evidence of focal hepatic lesion. Pancreas: No ductal dilatation or inflammation. Spleen: Normal in size without focal abnormality. Adrenals/Urinary Tract: Normal adrenal glands. Extrarenal pelvis configuration of the right greater than left kidney. No renal stones. No perinephric edema. Urinary bladder is physiologically distended. No wall thickening. No bladder stone. Stomach/Bowel: Ingested contrast in the stomach. No gastric wall thickening. Administered enteric contrast reaches mid distal small bowel. No small bowel obstruction. Normal appendix. Moderate stool in the proximal colon. Small volume of stool in the more distal colon. Mild distal descending diverticulosis, prominent  diverticulosis in the sigmoid. No evidence of diverticulitis. Vascular/Lymphatic: Mild aortic atherosclerosis. No aneurysm. Definite enlarged lymph nodes in the abdomen or pelvis. Reproductive: Atrophic uterus. No gross adnexal mass. Other: Small volume simple free fluid in the pelvis. Minimal fluid in the right pericolic gutter. No free air. Expected stranding in the subcutaneous tissues port sites. No subcutaneous fluid collection. Musculoskeletal: There are no acute or suspicious osseous abnormalities. Scoliosis and degenerative change in the spine. IMPRESSION: 1. Post recent cholecystectomy with fluid collection in the gallbladder fossa measuring 6.5 x 2.5 cm, may be postoperative seroma or biloma. Tiny dependent hyperdensities within the fluid collection are nonspecific, may represent drop stones or potentially surgical packing. Consider nuclear medicine hepatic biliary scan if there is concern for biloma. 2. Prominent common bile duct at 10 mm, may be normal post cholecystectomy. 3. Additional free fluid about the liver and tracking into the pelvis measures simple fluid density. 4. Small right pleural effusion.  Bibasilar atelectasis. 5. Colonic diverticulosis without diverticulitis. Aortic Atherosclerosis (ICD10-I70.0). Electronically Signed   By: Keith Rake M.D.   On: 08/29/2019 02:36   Dg Chest Port 1 View  Result Date: 08/28/2019 CLINICAL DATA:  CHF, shortness of breath EXAM: PORTABLE CHEST 1 VIEW COMPARISON:  None. FINDINGS: There is mild cardiomegaly. Pulmonary vascular congestion is seen. There is mildly increased interstitial markings throughout both lungs. There is blunting of the left costophrenic angle which could be due to a trace left pleural effusion. No acute osseous abnormality. IMPRESSION: Mild cardiomegaly and interstitial edema. Probable trace left pleural effusion. Electronically Signed   By: Prudencio Pair M.D.   On: 08/28/2019 22:34   Ct Image Guided Drainage By Percutaneous  Catheter  Result Date: 08/30/2019 INDICATION: 78 year old  female with post cholecystectomy bile leak. She presents for CT-guided drain placement. EXAM: CT-guided drain placement MEDICATIONS: The patient is currently admitted to the hospital and receiving intravenous antibiotics. The antibiotics were administered within an appropriate time frame prior to the initiation of the procedure. ANESTHESIA/SEDATION: Fentanyl 100 mcg IV; Versed 4 mg IV, 1 mg Dilaudid Moderate Sedation Time:  34 minutes The patient was continuously monitored during the procedure by the interventional radiology nurse under my direct supervision. COMPLICATIONS: None immediate. PROCEDURE: Informed written consent was obtained from the patient after a thorough discussion of the procedural risks, benefits and alternatives. All questions were addressed. A timeout was performed prior to the initiation of the procedure. A planning axial CT scan was performed. The fluid collection in the gallbladder fossa was successfully identified. A suitable skin entry site was selected and marked. Local anesthesia was attained by infiltration with 1% lidocaine. A small dermatotomy was made. Under intermittent CT guidance, an 18 gauge trocar needle was advanced into the fluid collection. A 0.035 wire was then advanced into the fluid collection. The needle was removed. The soft tissue tract was dilated to 10 [place] and a [place] all-purpose drainage catheter was advanced over the wire and formed in the fluid collection. Aspiration yields several 100 mL of bilious fluid. The catheter was secured in place with 0 Prolene suture and connected to JP bulb suction. Follow-up CT imaging demonstrates a well-positioned drainage catheter with significantly decreased fluid in the gallbladder fossa. However, some of the fluid has leaked into the subcutaneous soft tissues along the catheter tubing. IMPRESSION: Successful placement of 10 French drainage catheter into the  gallbladder fossa with aspiration of several 100 mL bilious fluid. Electronically Signed   By: Jacqulynn Cadet M.D.   On: 08/30/2019 13:47   Nm Hepato Biliary Leak  Addendum Date: 08/29/2019   ADDENDUM REPORT: 08/29/2019 11:46 ADDENDUM: These results were called by telephone at the time of interpretation on 08/29/2019 at 11:46 am to provider Dr. Marlou Starks, who verbally acknowledged these results. Electronically Signed   By: Zetta Bills M.D.   On: 08/29/2019 11:46   Result Date: 08/29/2019 CLINICAL DATA:  History of suspected bile leak. EXAM: NUCLEAR MEDICINE HEPATOBILIARY IMAGING TECHNIQUE: Sequential images of the abdomen were obtained out to 60 minutes following intravenous administration of radiopharmaceutical. RADIOPHARMACEUTICALS:  5.1 mCi Tc-10m  Choletec IV COMPARISON:  CT study of 08/29/2019 FINDINGS: Prompt uptake of radiotracer into hepatic parenchyma with excretion into the biliary tree is noted. An amorphous collection of radiotracer is demonstrated, enlarging over time, over the right upper quadrant extending into the expected area of gallbladder fossa and porta hepatis. Likely tracking up over the liver as well. IMPRESSION: Nuclear medicine signs of biliary leak. No signs of small bowel activity. A call is out to the referring provider to discuss findings in the above case. Electronically Signed: By: Zetta Bills M.D. On: 08/29/2019 11:39     Antimicrobials:   Zosyn   Subjective: Patient reports no complications overnight mild right upper quadrant pain no acute changes no nausea no vomiting  Objective: Vitals:   09/02/19 0500 09/02/19 0600 09/02/19 0730 09/02/19 0800  BP:  (!) 135/51  (!) 111/56  Pulse:  (!) 54  72  Resp:  19  17  Temp:   (!) 100.6 F (38.1 C)   TempSrc:   Oral   SpO2:  94%  93%  Weight: 97.1 kg     Height:        Intake/Output  Summary (Last 24 hours) at 09/02/2019 1152 Last data filed at 09/02/2019 0552 Gross per 24 hour  Intake 184.87 ml  Output  630 ml  Net -445.13 ml   Filed Weights   08/31/19 0500 09/01/19 0300 09/02/19 0500  Weight: 90.1 kg 92.1 kg 97.1 kg    Examination:  General exam: Appears calm and comfortable  Respiratory system: Clear to auscultation. Respiratory effort normal. Cardiovascular system: S1 & S2 heard, RRR. No JVD, murmurs, rubs, gallops or clicks. No pedal edema. Gastrointestinal system: Soft, not rigid no guarding no rebound, mildly tender to palpation right upper quadrant Central nervous system: Alert and oriented. No focal neurological deficits. Extremities: Warm well perfused, no edema Skin: No rashes, lesions or ulcers Psychiatry: Judgement and insight appear normal. Mood & affect appropriate.     Data Reviewed: I have personally reviewed following labs and imaging studies  CBC: Recent Labs  Lab 08/29/19 0526 08/30/19 0206 08/31/19 0204 09/01/19 0229 09/02/19 0419  WBC 10.3 12.2* 11.1* 10.9* 9.3  NEUTROABS 7.2 8.9* 6.8 5.5  --   HGB 11.1* 11.2* 10.1* 10.1* 10.0*  HCT 34.1* 34.9* 31.8* 31.1* 31.0*  MCV 96.1 96.7 98.1 97.2 96.9  PLT 278 286 270 281 532   Basic Metabolic Panel: Recent Labs  Lab 08/29/19 0526 08/29/19 1508 08/30/19 0206 08/31/19 0204 09/01/19 0229  NA 135 138 138 135 135  K 3.9 4.9 3.2* 3.3* 3.6  CL 99 97* 101 99 102  CO2 24 28 24 26 22   GLUCOSE 105* 110* 99 108* 93  BUN 40* 36* 34* 36* 31*  CREATININE 3.33* 3.01* 2.57* 2.18* 1.52*  CALCIUM 9.2 9.5 8.6* 8.2* 8.1*   GFR: Estimated Creatinine Clearance: 32.6 mL/min (A) (by C-G formula based on SCr of 1.52 mg/dL (H)). Liver Function Tests: Recent Labs  Lab 08/28/19 1655 08/29/19 0526 08/30/19 0206 09/01/19 0229  AST 25 22 20 25   ALT 16 15 13 14   ALKPHOS 80 78 79 94  BILITOT 1.1 1.4* 2.1* 1.2  PROT 7.8 6.8 6.2* 6.0*  ALBUMIN 3.6 3.2* 2.8* 2.7*   Recent Labs  Lab 08/28/19 1655  LIPASE 42   No results for input(s): AMMONIA in the last 168 hours. Coagulation Profile: Recent Labs  Lab  08/29/19 1805 08/31/19 1226  INR 1.1 1.1   Cardiac Enzymes: No results for input(s): CKTOTAL, CKMB, CKMBINDEX, TROPONINI in the last 168 hours. BNP (last 3 results) No results for input(s): PROBNP in the last 8760 hours. HbA1C: No results for input(s): HGBA1C in the last 72 hours. CBG: No results for input(s): GLUCAP in the last 168 hours. Lipid Profile: No results for input(s): CHOL, HDL, LDLCALC, TRIG, CHOLHDL, LDLDIRECT in the last 72 hours. Thyroid Function Tests: No results for input(s): TSH, T4TOTAL, FREET4, T3FREE, THYROIDAB in the last 72 hours. Anemia Panel: No results for input(s): VITAMINB12, FOLATE, FERRITIN, TIBC, IRON, RETICCTPCT in the last 72 hours. Sepsis Labs: No results for input(s): PROCALCITON, LATICACIDVEN in the last 168 hours.  Recent Results (from the past 240 hour(s))  SARS CORONAVIRUS 2 (TAT 6-24 HRS) Nasopharyngeal Nasopharyngeal Swab     Status: None   Collection Time: 08/28/19 11:25 PM   Specimen: Nasopharyngeal Swab  Result Value Ref Range Status   SARS Coronavirus 2 NEGATIVE NEGATIVE Final    Comment: (NOTE) SARS-CoV-2 target nucleic acids are NOT DETECTED. The SARS-CoV-2 RNA is generally detectable in upper and lower respiratory specimens during the acute phase of infection. Negative results do not preclude SARS-CoV-2 infection, do not rule  out co-infections with other pathogens, and should not be used as the sole basis for treatment or other patient management decisions. Negative results must be combined with clinical observations, patient history, and epidemiological information. The expected result is Negative. Fact Sheet for Patients: SugarRoll.be Fact Sheet for Healthcare Providers: https://www.woods-mathews.com/ This test is not yet approved or cleared by the [place] FDA and  has been authorized for detection and/or diagnosis of SARS-CoV-2 by FDA under an Emergency Use Authorization (EUA).  This EUA will remain  in effect (meaning this test can be used) for the duration of the COVID-19 declaration under Section 56 4(b)(1) of the Act, 21 U.S.C. section 360bbb-3(b)(1), unless the authorization is terminated or revoked sooner. Performed at [facility], [place] [address], [place], [place] [address]   MRSA PCR Screening     Status: None   Collection Time: 08/29/19  5:00 PM   Specimen: Nasal Mucosa; Nasopharyngeal  Result Value Ref Range Status   MRSA by PCR NEGATIVE NEGATIVE Final    Comment:        The GeneXpert MRSA Assay (FDA approved for NASAL specimens only), is one component of a comprehensive MRSA colonization surveillance program. It is not intended to diagnose MRSA infection nor to guide or monitor treatment for MRSA infections. Performed at [facility], [place] [address] [place], [place] [address]   Culture, blood (routine x 2)     Status: None (Preliminary result)   Collection Time: 08/30/19  8:10 AM   Specimen: BLOOD  Result Value Ref Range Status   Specimen Description   Final    BLOOD LEFT HAND Performed at [facility] [address] [place], [place] [address]    Special Requests   Final    BOTTLES DRAWN AEROBIC AND ANAEROBIC Blood Culture adequate volume Performed at [facility] [address], [place], [place] [address]    Culture   Final    NO GROWTH 3 DAYS Performed at [facility], [place] [address] [place], [place] [address]    Report Status PENDING  Incomplete  Culture, blood (routine x 2)     Status: None (Preliminary result)   Collection Time: 08/30/19  8:21 AM   Specimen: BLOOD  Result Value Ref Range Status   Specimen Description   Final    BLOOD RIGHT HAND Performed at [facility] [address], [place], [place] [address]    Special Requests   Final    BOTTLES DRAWN AEROBIC ONLY Blood Culture results may not be optimal due to an inadequate  volume of blood received in culture bottles Performed at [facility] [address], [place], [place] [address]    Culture   Final    NO GROWTH 3 DAYS Performed at [facility], [place] [address], [place],  [address]    Report Status PENDING  Incomplete         Radiology Studies: No results found.      Scheduled Meds: . allopurinol  100 mg Oral Daily  . Chlorhexidine Gluconate Cloth  6 each Topical Daily  . diazepam  2.5 mg Oral QHS  . mouth rinse  15 mL Mouth Rinse BID  . metoprolol succinate  100 mg Oral Daily  . sodium chloride flush  3 mL Intravenous Q12H  . sodium chloride flush  3 mL Intravenous Q12H  . sodium chloride flush  5 mL Intracatheter Q8H   Continuous Infusions: .  sodium chloride    . heparin 1,650 Units/hr (09/02/19 0547)  . piperacillin-tazobactam (ZOSYN)  IV Stopped (09/02/19 0955)     LOS: 4 days    Time spent: 57 min    Nicolette Bang, MD [facility]  If 7PM-7AM, please contact night-coverage  09/02/2019, 11:52 AM

## 2019-09-02 NOTE — Progress Notes (Signed)
Brief Pharmacy Consult Note - IV Heparin  Labs: heparin level 0.41  A/P: heparin level therapeutic x 2 (goal 0.3-0.7) on current IV heparin rate of 1650 units/hr. Continue current IV heparin rate. Plan to stop IV heparin tomorrow at 0700 in prep for ERCP tomorrow at 1100 per notes. Will recheck heparin level in AM  Adrian Saran, PharmD, BCPS 09/02/2019 3:30 PM

## 2019-09-02 NOTE — Progress Notes (Signed)
S: dull pains in abdomen, able to sit for > 1 hour yesterday, tolerating diet O: BP (!) 102/34   Pulse 78   Temp (!) 100.6 F (38.1 C) (Oral) Comment: reported to rn   Resp 19   Ht 5\' 2"  (1.575 m)   Wt 97.1 kg   SpO2 94%   BMI 39.15 kg/m  Gen: NAD Neuro: GCS 15 Ab: soft, tenderness RUQ, drain with bilious output  A/P 78 yo female s/p lap chole with complication of bile leak -continue abx -GI planning for ERCP/stent tomorrow

## 2019-09-02 NOTE — Progress Notes (Signed)
Pharmacy: Re-heparin drip  Patient is scheduled for ERCP on 09/03/19 at ~11am.  Per GI, hold heparin drip at 4 AM on 10/27 for procedure.    Plan: - stop date/time entered for heparin (4 AM on 10/27) - please advise if/when heparin drip can be resumed for patient after procedure.  Dia Sitter, PharmD, BCPS 09/02/2019 9:21 PM

## 2019-09-02 NOTE — Progress Notes (Signed)
ANTICOAGULATION CONSULT NOTE - Follow Up Consult  Pharmacy Consult for Heparin Indication: atrial fibrillation  Allergies  Allergen Reactions  . Flecainide Acetate Other (See Comments)    REACTION: SOB, swelling (hospitalized)    Patient Measurements: Height: 5\' 2"  (157.5 cm) Weight: 203 lb 0.7 oz (92.1 kg) IBW/kg (Calculated) : 50.1 Heparin Dosing Weight:   Vital Signs: Temp: 98.9 F (37.2 C) (10/26 0415) Temp Source: Oral (10/26 0415) BP: 112/59 (10/26 0000) Pulse Rate: 98 (10/26 0000)  Labs: Recent Labs    08/31/19 0204 08/31/19 1226  09/01/19 0229 09/01/19 0833 09/01/19 1820 09/02/19 0419  HGB 10.1*  --   --  10.1*  --   --  10.0*  HCT 31.8*  --   --  31.1*  --   --  31.0*  PLT 270  --   --  281  --   --  337  APTT  --  32  --   --   --   --   --   LABPROT  --  13.8  --   --   --   --   --   INR  --  1.1  --   --   --   --   --   HEPARINUNFRC  --  <0.10*   < >  --  0.16* 0.25* 0.32  CREATININE 2.18*  --   --  1.52*  --   --   --    < > = values in this interval not displayed.    Estimated Creatinine Clearance: 31.7 mL/min (A) (by C-G formula based on SCr of 1.52 mg/dL (H)).   Medications:  Infusions:  . sodium chloride    . heparin 1,550 Units/hr (09/02/19 0032)  . piperacillin-tazobactam (ZOSYN)  IV Stopped (09/01/19 2353)    Assessment: Patient with heparin level at goal but lower end of goal.  Will increase heparin to keep within range.  No issues with heparin per RN.  Goal of Therapy:  Heparin level 0.3-0.7 units/ml Monitor platelets by anticoagulation protocol: Yes   Plan:  Increase heparin to 1650 units/hr Recheck level at [place], Shea Stakes [place] 09/02/2019,5:27 AM

## 2019-09-02 NOTE — Progress Notes (Signed)
Responded to spiritual care consult for an AD. Pt was awake and talking. No family present. She requested that Santiago Glad be her HCPOA. Also, I gave her a copy to fill out, she said that she is going to have it completed tonight and is requesting it to get notarized in the morning before going for a procedure around noon. She looked nervous about tomorrow and wants to get her paperwork done. She said she lives alone with her cats and seemed more at ease as we conversed.    Chaplain Resident  Fidel Levy [phone]

## 2019-09-02 NOTE — Progress Notes (Signed)
Referring Physician(s): Paulita Fujita, W  Supervising Physician: Rolla Plate  Patient Status:  Kristen [name] IP  Chief Complaint:   abdominal pain, biloma  Subjective: Pt resting comfortably; still has some RUQ discomfort but improved since abd fluid drainage   Allergies: Flecainide acetate  Medications: Prior to Admission medications   Medication Sig Start Date End Date Taking? Authorizing Provider  alendronate (FOSAMAX) 10 MG tablet TAKE (1) TABLET BY MOUTH PER WEEK TAKE ON EMPTY STOMACH WITH FULL GLASS OF WATER Patient taking differently: Take 10 mg by mouth daily before breakfast. TAKE (1) TABLET BY MOUTH PER WEEK TAKE ON EMPTY STOMACH WITH FULL GLASS OF WATER 05/02/19  Yes [name], MD  allopurinol (ZYLOPRIM) 100 MG tablet TAKE 1 TABLET BY MOUTH EVERY DAY Patient taking differently: Take 100 mg by mouth daily.  06/24/19  Yes [name], MD  calcium carbonate (OSCAL) 1500 (600 Ca) MG TABS tablet Take 1,500 mg by mouth daily.    Yes [provider]  calcium carbonate (TUMS - DOSED IN MG ELEMENTAL CALCIUM) 500 MG chewable tablet Chew 1 tablet by mouth as needed for indigestion or heartburn.   Yes [provider]  cholecalciferol (VITAMIN D3) 25 MCG (1000 UT) tablet Take 1,000 Units by mouth daily.   Yes [provider]  CINNAMON PO Take 1 tablet by mouth daily.    Yes [provider]  clindamycin (CLINDAGEL) 1 % gel APPLY TO AFFECTED AREA TWICE A DAY Patient taking differently: Apply 1 application topically 2 (two) times daily.  04/13/18  Yes [name], MD  colchicine 0.6 MG tablet Take 1 tablet (0.6 mg total) by mouth 2 (two) times daily. Take twice daily until pain resolves and then take as needed for gout flare. Patient taking differently: Take 0.6 mg by mouth 2 (two) times daily as needed. Take twice daily until pain resolves and then take as needed for gout flare. 06/15/18  Yes [name], MD  [employer] ANTI-FUNGAL 2 % powder APPLY TO AFFECTED  AREA TWICE A DAY AFTER SKIN RESOLVES FROM KETOCONAZOLE CREAM Patient taking differently: as needed.  10/17/14  Yes [name], MD  diazepam (VALIUM) 5 MG tablet TAKE 1/2 TABLET (2.5 MG TOTAL) BY MOUTH AT BEDTIME. Patient taking differently: Take 2.5 mg by mouth at bedtime.  07/08/19  Yes [name], MD  doxycycline (VIBRAMYCIN) 50 MG capsule TAKE 2 CAPSULES BY MOUTH 2 (TWO) TIMES DAILY. Patient taking differently: Take 100 mg by mouth See admin instructions. Twice daily As directed for deer tick's (allergic reaction) 04/25/18  Yes [name], MD  ELIQUIS 5 MG TABS tablet TAKE 1 TABLET BY MOUTH TWICE A DAY Patient taking differently: Take 5 mg by mouth 2 (two) times daily.  05/23/19  Yes Park Liter, MD  Flax Oil-Fish Oil-Borage Oil (FISH-FLAX-BORAGE) CAPS Take 1 capsule by mouth daily.   Yes [provider]  furosemide (LASIX) 20 MG tablet Take 1 tablet (20 mg total) by mouth daily. Patient taking differently: Take 20 mg by mouth daily.  08/19/19  Yes Park Liter, MD  glucosamine-chondroitin (GLUCOSAMINE-CHONDROITIN DS) 500-400 MG tablet Take 1 tablet by mouth daily.    Yes [provider]  HYDROcodone-acetaminophen (NORCO/VICODIN) 5-325 MG tablet Take 1 tablet by mouth every 6 (six) hours as needed for moderate pain. 08/22/19  Yes [name], [name], MD  ibuprofen (ADVIL) 800 MG tablet Take 1 tablet (800 mg total) by mouth every 8 (eight) hours as needed. 08/22/19  Yes [name], [name],  MD  lisinopril-hydrochlorothiazide (ZESTORETIC) 20-25 MG tablet Take 1 tablet by mouth daily. Patient taking differently: Take 1 tablet by mouth daily.  03/08/19  Yes [name], MD  loratadine (CLARITIN) 10 MG tablet Take 1 tablet (10 mg total) by mouth daily. 07/08/19  Yes [name], MD  metoprolol succinate (TOPROL-XL) 100 MG 24 hr tablet Take 1 tablet (100 mg total) by mouth daily. Take with or immediately following a meal. Patient taking  differently: Take 100 mg by mouth daily. Take with or immediately following a meal. 01/29/19 08/28/19 Yes Park Liter, MD  Multiple Vitamins-Minerals (EMERGEN-C IMMUNE) PACK Take 1 tablet by mouth daily.    Yes [provider]  Multiple Vitamins-Minerals (MULTIVITAMIN WOMEN 50+) TABS Take 1 tablet by mouth daily.    Yes [provider]  mupirocin ointment (BACTROBAN) 2 % APPLY TOPICALLY TO AFFECTED AREA TWICE DAILY Patient taking differently: as needed.  02/20/19  Yes [name], MD  Nutritional Supplements (ECHINACEA/GOLDEN SEAL PO) Take 1 tablet by mouth daily.    Yes [provider]  polycarbophil (FIBERCON) 625 MG tablet Take 625 mg by mouth daily.   Yes [provider]  potassium chloride (KLOR-CON) 20 MEQ packet MIX 1 PACKET IN LIQUID AND TAKE BY MOUTH DAILY AS DIRECTED Patient taking differently: Take 20 mEq by mouth daily. MIX 1 PACKET IN LIQUID AND TAKE BY MOUTH DAILY AS DIRECTED 02/20/19  Yes [name], MD  Probiotic Product (PROBIOTIC-10 PO) Take 1 tablet by mouth daily.    Yes [provider]  TURMERIC PO Take 1 tablet by mouth daily.    Yes [provider]     Vital Signs: BP 117/62 (BP Location: Left Arm)   Pulse 77   Temp 98.1 F (36.7 C) (Oral)   Resp 16   Ht 5\' 2"  (1.575 m)   Wt 214 lb 1.1 oz (97.1 kg)   SpO2 98%   BMI 39.15 kg/m   Physical Exam  Awake/alert; RUQ drain intact, dressing dry, site mildly tender, output 105 cc bilious fluid Imaging: Ct Image Guided Drainage By Percutaneous Catheter  Result Date: 08/30/2019 INDICATION: 78 year old female with post cholecystectomy bile leak. She presents for CT-guided drain placement. EXAM: CT-guided drain placement MEDICATIONS: The patient is currently admitted to the hospital and receiving intravenous antibiotics. The antibiotics were administered within an appropriate time frame prior to the initiation of the procedure. ANESTHESIA/SEDATION: Fentanyl  100 mcg IV; Versed 4 mg IV, 1 mg Dilaudid Moderate Sedation Time:  34 minutes The patient was continuously monitored during the procedure by the interventional radiology nurse under my direct supervision. COMPLICATIONS: None immediate. PROCEDURE: Informed written consent was obtained from the patient after a thorough discussion of the procedural risks, benefits and alternatives. All questions were addressed. A timeout was performed prior to the initiation of the procedure. A planning axial CT scan was performed. The fluid collection in the gallbladder fossa was successfully identified. A suitable skin entry site was selected and marked. Local anesthesia was attained by infiltration with 1% lidocaine. A small dermatotomy was made. Under intermittent CT guidance, an 18 gauge trocar needle was advanced into the fluid collection. A 0.035 wire was then advanced into the fluid collection. The needle was removed. The soft tissue tract was dilated to 10 [place] and a [place] all-purpose drainage catheter was advanced over the wire and formed in the fluid collection. Aspiration yields several 100 mL of bilious fluid. The catheter was secured in place with 0 Prolene suture  and connected to JP bulb suction. Follow-up CT imaging demonstrates a well-positioned drainage catheter with significantly decreased fluid in the gallbladder fossa. However, some of the fluid has leaked into the subcutaneous soft tissues along the catheter tubing. IMPRESSION: Successful placement of 10 French drainage catheter into the gallbladder fossa with aspiration of several 100 mL bilious fluid. Electronically Signed   By: Jacqulynn Cadet M.D.   On: 08/30/2019 13:47    Labs:  CBC: Recent Labs    08/30/19 0206 08/31/19 0204 09/01/19 0229 09/02/19 0419  WBC 12.2* 11.1* 10.9* 9.3  HGB 11.2* 10.1* 10.1* 10.0*  HCT 34.9* 31.8* 31.1* 31.0*  PLT 286 270 281 337    COAGS: Recent Labs    08/29/19 1805 08/31/19 1226  INR 1.1 1.1   APTT  --  32    BMP: Recent Labs    08/29/19 1508 08/30/19 0206 08/31/19 0204 09/01/19 0229  NA 138 138 135 135  K 4.9 3.2* 3.3* 3.6  CL 97* 101 99 102  CO2 28 24 26 22   GLUCOSE 110* 99 108* 93  BUN 36* 34* 36* 31*  CALCIUM 9.5 8.6* 8.2* 8.1*  CREATININE 3.01* 2.57* 2.18* 1.52*  GFRNONAA 14* 17* 21* 32*  GFRAA 16* 20* 24* 37*    LIVER FUNCTION TESTS: Recent Labs    08/28/19 1655 08/29/19 0526 08/30/19 0206 09/01/19 0229  BILITOT 1.1 1.4* 2.1* 1.2  AST 25 22 20 25   ALT 16 15 13 14   ALKPHOS 80 78 79 94  PROT 7.8 6.8 6.2* 6.0*  ALBUMIN 3.6 3.2* 2.8* 2.7*    Assessment and Plan: Pt s/p lap chole 10/15; now with GB fossa fluid collection/biloma, s/p drain placement 10/23;  Afebrile; WBC nl; hgb 10, blood cx neg to date; cont drain until output minimal then check f/u CT; for ERCP with stenting on 10/27    Electronically Signed: D. Rowe Robert, PA-C 09/02/2019, 7:51 PM   I spent a total of 15 minutes at the the patient's bedside AND on the patient's hospital floor or unit, greater than 50% of which was counseling/coordinating care for gallbladder fossa fluid collection drain    Patient ID: Kristen [name], female   DOB: 1941-08-20, 78 y.o.   MRN: [id number]

## 2019-09-02 NOTE — Progress Notes (Signed)
[facility] Progress Note  Kristen [name] [place] 78 y.o. 29-Sep-1941  CC: Bile leak   Subjective: Abdominal pain has improved but currently having low-grade fever.  Continues to have around 190 cc biliary output.  ROS :   Negative for chest pain.  Denies any acute shortness of breath   Objective: Vital signs in last 24 hours: Vitals:   09/02/19 0730 09/02/19 0800  BP:  (!) 111/56  Pulse:  72  Resp:  17  Temp: (!) 100.6 F (38.1 C)   SpO2:  93%    Physical Exam:  General:  Alert, cooperative, no distress, appears stated age  Head:  Normocephalic, without obvious abnormality, atraumatic  Eyes:  , EOM's intact,   Lungs:    Fine basilar crackles, anterior exam only, no significant respiratory distress  Heart:   Irregularly irregular rate and rhythm,  Abdomen:    JP drain right upper quadrant, mild right upper quadrant and left upper quadrant tenderness to palpation, bowel sounds present.  No peritoneal signs  Extremities: Extremities normal, atraumatic, no  edema       Lab Results: Recent Labs    08/31/19 0204 09/01/19 0229  NA 135 135  K 3.3* 3.6  CL 99 102  CO2 26 22  GLUCOSE 108* 93  BUN 36* 31*  CREATININE 2.18* 1.52*  CALCIUM 8.2* 8.1*   Recent Labs    09/01/19 0229  AST 25  ALT 14  ALKPHOS 94  BILITOT 1.2  PROT 6.0*  ALBUMIN 2.7*   Recent Labs    08/31/19 0204 09/01/19 0229 09/02/19 0419  WBC 11.1* 10.9* 9.3  NEUTROABS 6.8 5.5  --   HGB 10.1* 10.1* 10.0*  HCT 31.8* 31.1* 31.0*  MCV 98.1 97.2 96.9  PLT 270 281 337   Recent Labs    08/31/19 1226  LABPROT 13.8  INR 1.1      Assessment/Plan: -Bile leak s/p laparoscopic cholecystectomy.  Currently s/p IR guided drain placement. -History of A. fib.  Eliquis on hold -History of CHF.  Complaining of mild shortness of breath.  Recommendations ---------------------- -Discussed with Dr. Watt Climes.  Plan for ERCP tomorrow around 11 AM.  Hold heparin drip from 4 AM in the morning.   Continue antibiotics.  Keep n.p.o. past midnight.  Procedure, risk, alternatives discussed with the patient.  Procedure explained by drawing it on a paper.  Risk of bleeding, perforation, post ERCP pancreatitis and infection discussed.  She verbalized understanding.  Otis Brace MD, [place] 09/02/2019, 9:17 AM  Contact #  [phone]

## 2019-09-03 ENCOUNTER — Inpatient Hospital Stay (HOSPITAL_COMMUNITY): Payer: Medicare Other

## 2019-09-03 ENCOUNTER — Encounter (HOSPITAL_COMMUNITY)
Admission: EM | Disposition: A | Discharge status: Skilled Nursing Facility | Payer: Self-pay | Source: Home / Self Care | Attending: Internal Medicine

## 2019-09-03 ENCOUNTER — Inpatient Hospital Stay (HOSPITAL_COMMUNITY): Payer: Medicare Other | Admitting: Certified Registered Nurse Anesthetist

## 2019-09-03 ENCOUNTER — Encounter (HOSPITAL_COMMUNITY): Payer: Self-pay | Admitting: Certified Registered Nurse Anesthetist

## 2019-09-03 DIAGNOSIS — K8050 Calculus of bile duct without cholangitis or cholecystitis without obstruction: Secondary | ICD-10-CM | POA: Diagnosis not present

## 2019-09-03 DIAGNOSIS — K839 Disease of biliary tract, unspecified: Secondary | ICD-10-CM | POA: Diagnosis not present

## 2019-09-03 DIAGNOSIS — K831 Obstruction of bile duct: Secondary | ICD-10-CM | POA: Diagnosis not present

## 2019-09-03 HISTORY — PX: BILIARY STENT PLACEMENT: SHX5538

## 2019-09-03 HISTORY — PX: ERCP: SHX5425

## 2019-09-03 HISTORY — PX: SPHINCTEROTOMY: SHX5544

## 2019-09-03 HISTORY — PX: REMOVAL OF STONES: SHX5545

## 2019-09-03 LAB — CBC
HCT: 33.1 % — ABNORMAL LOW (ref 36.0–46.0)
Hemoglobin: 10.7 g/dL — ABNORMAL LOW (ref 12.0–15.0)
MCH: 31.1 pg (ref 26.0–34.0)
MCHC: 32.3 g/dL (ref 30.0–36.0)
MCV: 96.2 fL (ref 80.0–100.0)
Platelets: 402 10*3/uL — ABNORMAL HIGH (ref 150–400)
RBC: 3.44 MIL/uL — ABNORMAL LOW (ref 3.87–5.11)
RDW: 13.6 % (ref 11.5–15.5)
WBC: 10 10*3/uL (ref 4.0–10.5)
nRBC: 0 % (ref 0.0–0.2)

## 2019-09-03 LAB — COMPREHENSIVE METABOLIC PANEL
ALT: 18 U/L (ref 0–44)
AST: 29 U/L (ref 15–41)
Albumin: 2.5 g/dL — ABNORMAL LOW (ref 3.5–5.0)
Alkaline Phosphatase: 102 U/L (ref 38–126)
Anion gap: 11 (ref 5–15)
BUN: 20 mg/dL (ref 8–23)
CO2: 22 mmol/L (ref 22–32)
Calcium: 8.5 mg/dL — ABNORMAL LOW (ref 8.9–10.3)
Chloride: 104 mmol/L (ref 98–111)
Creatinine, Ser: 1.14 mg/dL — ABNORMAL HIGH (ref 0.44–1.00)
GFR calc Af Amer: 53 mL/min — ABNORMAL LOW (ref >60)
GFR calc non Af Amer: 46 mL/min — ABNORMAL LOW (ref >60)
Glucose, Bld: 95 mg/dL (ref 70–99)
Potassium: 3.7 mmol/L (ref 3.5–5.1)
Sodium: 137 mmol/L (ref 135–145)
Total Bilirubin: 0.7 mg/dL (ref 0.3–1.2)
Total Protein: 6.5 g/dL (ref 6.5–8.1)

## 2019-09-03 SURGERY — ERCP, WITH INTERVENTION IF INDICATED
Anesthesia: General

## 2019-09-03 MED ORDER — DEXAMETHASONE SODIUM PHOSPHATE 10 MG/ML IJ SOLN
INTRAMUSCULAR | Status: DC | PRN
  Administered 2019-09-03: 5 mg via INTRAVENOUS

## 2019-09-03 MED ORDER — ONDANSETRON HCL 4 MG/2ML IJ SOLN
INTRAMUSCULAR | Status: DC | PRN
  Administered 2019-09-03: 4 mg via INTRAVENOUS

## 2019-09-03 MED ORDER — SODIUM CHLORIDE 0.9 % IV SOLN
INTRAVENOUS | Status: DC | PRN
  Administered 2019-09-03: 30 mL

## 2019-09-03 MED ORDER — MIDAZOLAM HCL 2 MG/2ML IJ SOLN
INTRAMUSCULAR | Status: AC
  Filled 2019-09-03: qty 2

## 2019-09-03 MED ORDER — SUGAMMADEX SODIUM 200 MG/2ML IV SOLN
INTRAVENOUS | Status: DC | PRN
  Administered 2019-09-03: 200 mg via INTRAVENOUS

## 2019-09-03 MED ORDER — ONDANSETRON HCL 4 MG/2ML IJ SOLN
INTRAMUSCULAR | Status: AC
  Filled 2019-09-03: qty 2

## 2019-09-03 MED ORDER — FENTANYL CITRATE (PF) 100 MCG/2ML IJ SOLN
INTRAMUSCULAR | Status: AC
  Filled 2019-09-03: qty 2

## 2019-09-03 MED ORDER — SODIUM CHLORIDE 0.9 % IV SOLN: INTRAVENOUS | Status: DC

## 2019-09-03 MED ORDER — MIDAZOLAM HCL 2 MG/2ML IJ SOLN
INTRAMUSCULAR | Status: DC | PRN
  Administered 2019-09-03 (×2): 1 mg via INTRAVENOUS

## 2019-09-03 MED ORDER — FENTANYL CITRATE (PF) 100 MCG/2ML IJ SOLN
INTRAMUSCULAR | Status: DC | PRN
  Administered 2019-09-03 (×2): 50 ug via INTRAVENOUS

## 2019-09-03 MED ORDER — PHENYLEPHRINE 40 MCG/ML (10ML) SYRINGE FOR IV PUSH (FOR BLOOD PRESSURE SUPPORT)
PREFILLED_SYRINGE | INTRAVENOUS | Status: DC | PRN
  Administered 2019-09-03 (×2): 120 ug via INTRAVENOUS

## 2019-09-03 MED ORDER — INDOMETHACIN 50 MG RE SUPP
RECTAL | Status: AC
  Filled 2019-09-03: qty 2

## 2019-09-03 MED ORDER — PROPOFOL 10 MG/ML IV BOLUS
INTRAVENOUS | Status: DC | PRN
  Administered 2019-09-03: 120 mg via INTRAVENOUS

## 2019-09-03 MED ORDER — LIDOCAINE HCL (CARDIAC) PF 100 MG/5ML IV SOSY
PREFILLED_SYRINGE | INTRAVENOUS | Status: DC | PRN
  Administered 2019-09-03: 100 mg via INTRAVENOUS

## 2019-09-03 MED ORDER — INDOMETHACIN 50 MG RE SUPP
RECTAL | Status: DC | PRN
  Administered 2019-09-03: 100 mg via RECTAL

## 2019-09-03 MED ORDER — PROPOFOL 10 MG/ML IV BOLUS
INTRAVENOUS | Status: AC
  Filled 2019-09-03: qty 20

## 2019-09-03 MED ORDER — GLUCAGON HCL RDNA (DIAGNOSTIC) 1 MG IJ SOLR
INTRAMUSCULAR | Status: AC
  Filled 2019-09-03: qty 1

## 2019-09-03 MED ORDER — ROCURONIUM BROMIDE 10 MG/ML (PF) SYRINGE
PREFILLED_SYRINGE | INTRAVENOUS | Status: DC | PRN
  Administered 2019-09-03: 90 mg via INTRAVENOUS

## 2019-09-03 NOTE — Anesthesia Preprocedure Evaluation (Addendum)
Anesthesia Evaluation  Patient identified by MRN, date of birth, ID band Patient awake    Reviewed: Allergy & Precautions, NPO status , Patient's Chart, lab work & pertinent test results  Airway Mallampati: I  TM Distance: >3 FB Neck ROM: Full    Dental   Pulmonary    Pulmonary exam normal        Cardiovascular hypertension, Pt. on medications Normal cardiovascular exam+ dysrhythmias Atrial Fibrillation      Neuro/Psych Anxiety Depression    GI/Hepatic GERD  Medicated and Controlled,  Endo/Other    Renal/GU      Musculoskeletal   Abdominal   Peds  Hematology   Anesthesia Other Findings   Reproductive/Obstetrics                            Anesthesia Physical Anesthesia Plan  ASA: III  Anesthesia Plan: General   Post-op Pain Management:    Induction: Intravenous  PONV Risk Score and Plan: 3 and Ondansetron, Midazolam and Treatment may vary due to age or medical condition  Airway Management Planned: Oral ETT  Additional Equipment:   Intra-op Plan:   Post-operative Plan: Extubation in OR  Informed Consent: I have reviewed the patients History and Physical, chart, labs and discussed the procedure including the risks, benefits and alternatives for the proposed anesthesia with the patient or authorized representative who has indicated his/her understanding and acceptance.       Plan Discussed with: CRNA and Surgeon  Anesthesia Plan Comments:         Anesthesia Quick Evaluation

## 2019-09-03 NOTE — Anesthesia Postprocedure Evaluation (Signed)
Anesthesia Post Note  Patient: Kristen [name]  Procedure(s) Performed: ENDOSCOPIC RETROGRADE CHOLANGIOPANCREATOGRAPHY (ERCP) (N/A ) BILIARY STENT PLACEMENT (N/A ) SPHINCTEROTOMY REMOVAL OF STONES     Patient location during evaluation: PACU Anesthesia Type: General Level of consciousness: awake and alert Pain management: pain level controlled Vital Signs Assessment: post-procedure vital signs reviewed and stable Respiratory status: spontaneous breathing, nonlabored ventilation, respiratory function stable and patient connected to nasal cannula oxygen Cardiovascular status: blood pressure returned to baseline and stable Postop Assessment: no apparent nausea or vomiting Anesthetic complications: no    Last Vitals:  Vitals:   09/03/19 1300 09/03/19 1310  BP: (!) 150/102 (!) 143/71  Pulse: 70 96  Resp: (!) 24 16  Temp:    SpO2: 93% 92%    Last Pain:  Vitals:   09/03/19 1310  TempSrc:   PainSc: 8                  Shiquita Collignon DAVID

## 2019-09-03 NOTE — Transfer of Care (Signed)
Immediate Anesthesia Transfer of Care Note  Patient: Kristen [name]  Procedure(s) Performed: ENDOSCOPIC RETROGRADE CHOLANGIOPANCREATOGRAPHY (ERCP) (N/A )  Patient Location: Endoscopy Unit  Anesthesia Type:General  Level of Consciousness: awake, alert , oriented and patient cooperative  Airway & Oxygen Therapy: Patient Spontanous Breathing  Post-op Assessment: Report given to RN and Post -op Vital signs reviewed and stable  Post vital signs: Reviewed and stable  Last Vitals:  Vitals Value Taken Time  BP 137/98 09/03/19 1230  Temp    Pulse 128 09/03/19 1231  Resp 17 09/03/19 1231  SpO2 91 % 09/03/19 1231  Vitals shown include unvalidated device data.  Last Pain:  Vitals:   09/03/19 1228  TempSrc:   PainSc: Asleep      Patients Stated Pain Goal: 3 (23/36/12 2449)  Complications: No apparent anesthesia complications

## 2019-09-03 NOTE — Op Note (Signed)
[facility] Patient Name: Norleen [name] Procedure Date: 09/03/2019 MRN: [id number] Attending MD: Clarene Essex , MD Date of Birth: [birth year] CSN: [id number] Age: 78 Admit Type: Inpatient Procedure:                ERCP Indications:              Bile leak, Treatment of bile leak Providers:                Clarene Essex, MD, Cleda Daub, RN, Marguerita Merles,                            Technician Referring MD:              Medicines:                General Anesthesia Complications:            No immediate complications. Estimated Blood Loss:     Estimated blood loss: none. Procedure:                Pre-Anesthesia Assessment:                           - Prior to the procedure, a History and Physical                            was performed, and patient medications and                            allergies were reviewed. The patient's tolerance of                            previous anesthesia was also reviewed. The risks                            and benefits of the procedure and the sedation                            options and risks were discussed with the patient.                            All questions were answered, and informed consent                            was obtained. Prior Anticoagulants: The patient has                            taken heparin, last dose was day of procedure. ASA                            Grade Assessment: II - A patient with mild systemic                            disease. After reviewing the risks and benefits,  the patient was deemed in satisfactory condition to                            undergo the procedure.                           After obtaining informed consent, the scope was                            passed under direct vision. Throughout the                            procedure, the patient's blood pressure, pulse, and                            oxygen saturations were monitored continuously. The                             TJF-Q180V ([id number]) Olympus duodenoscope was                            introduced through the mouth, and used to inject                            contrast into and used to locate the major papilla.                            The ERCP was accomplished without difficulty. The                            patient tolerated the procedure well. Scope In: Scope Out: Findings:      The major papilla was normal. On initial attempts at cannulation       possibly the wire went towards the pancreas a few times but no dye was       injected into the pancreas and we could not advance the wire deep into       the pancreas and with changing our position deep selective cannulation       was obtained and we probably saw a distal stone on initial cholangiogram       and we proceeded with a biliary sphincterotomy which was made with a       Hydratome sphincterotome using ERBE electrocautery. There was no       post-sphincterotomy bleeding. Choledocholithiasis was found in a       nondilated duct. We extended the sphincterotomy until we had adequate       biliary drainage and could get the three-quarter's bowed sphincterotome       easily in and out of the duct and the biliary tree was swept with a 12       mm balloon starting at the bifurcation. Sludge was swept from the duct.       1 stone was removed. Nothing was found on subsequent balloon       pull-through's starting at the bifurcation. And on final occlusion       cholangiogram the cystic duct leak was also confirmed no obvious  residual stones and we placed one 10 Fr by 7 cm plastic stent with a       single external flap and a single internal flap was placed 6 cm into the       common bile duct. Bile flowed through the stent. The stent was in good       position. Impression:               - The major papilla appeared normal.                           - Choledocholithiasis was found. Complete removal                             was accomplished by biliary sphincterotomy and                            balloon extraction.                           - A biliary sphincterotomy was performed.                           - The biliary tree was swept and nothing was found                            at the end of the procedure.                           - One plastic stent was placed into the common bile                            duct. The wire may have been advanced a short ways                            towards the pancreatic duct as above but no dye was                            injected into the pancreas Moderate Sedation:      Not Applicable - Patient had care per Anesthesia. Recommendation:           - Clear liquid diet for 6 hours. If doing well may                            have soft solids                           - Continue present medications. Prefer to restart                            heparin tomorrow if no delayed complications but                            can start sooner if absolutely needed without bolus  please or can restart Eliquis tomorrow if no                            delayed complications                           - Return to GI clinic in 4 weeks to set up stent                            removal in 6 to 8 weeks.                           - Telephone GI clinic if symptomatic PRN. Have IR                            reevaluate drain per their routine or when drainage                            decreases to minimum and consider metal covered                            stent in the future if the stent does not                            successfully allow the leak to heal Procedure Code(s):        --- Professional ---                           [phone], Esophagogastroduodenoscopy, flexible,                            transoral; diagnostic, including collection of                            specimen(s) by brushing or washing, when performed                             (separate procedure) Diagnosis Code(s):        --- Professional ---                           K80.50, Calculus of bile duct without cholangitis                            or cholecystitis without obstruction                           K83.8, Other specified diseases of biliary tract CPT copyright 2019 American Medical Association. All rights reserved. The codes documented in this report are preliminary and upon coder review may  be revised to meet current compliance requirements. Clarene Essex, MD 09/03/2019 12:23:14 PM This report has been signed electronically. Number of Addenda: 0

## 2019-09-03 NOTE — Progress Notes (Signed)
Pharmacy Antibiotic Note  Kristen [name] is a 78 y.o. female admitted on 08/28/2019 with bile leak after lap chole.  Patient's currently on zosyn broad empiric coverage.  Today, 09/03/2019: - Tmax 100.6, wbc wnl - scr down 1.14 (crcl~43) - s/p ERCP today  Plan: - continue Zosyn 3.375 mg IV q8h (infuse over 4 hrs) - with improving renal function, pharmacy will sign off for abx consult.  Reconsult [place] if need further assistance.  ____________________________________  Height: 5\' 2"  (157.5 cm) Weight: 210 lb 12.2 oz (95.6 kg) IBW/kg (Calculated) : 50.1  Temp (24hrs), Avg:98.6 F (37 C), Min:98.1 F (36.7 C), Max:98.9 F (37.2 C)  Recent Labs  Lab 08/29/19 1508 08/30/19 0206 08/31/19 0204 09/01/19 0229 09/02/19 0419 09/03/19 0154  WBC  --  12.2* 11.1* 10.9* 9.3 10.0  CREATININE 3.01* 2.57* 2.18* 1.52*  --  1.14*    Estimated Creatinine Clearance: 43.1 mL/min (A) (by C-G formula based on SCr of 1.14 mg/dL (H)).    Allergies  Allergen Reactions  . Flecainide Acetate Other (See Comments)    REACTION: SOB, swelling (hospitalized)    Thank you for allowing pharmacy to be a part of this patient's care.  Dia Sitter, PharmD, BCPS 09/03/2019 1:59 PM

## 2019-09-03 NOTE — Progress Notes (Signed)
[facility] Progress Note  Kristen [name] 78 y.o. 03-31-1941  CC: Bile leak   Subjective: Feeling better.  Abdominal pain has improved.  Continues to have bilious output in drain.   ROS :   Negative for chest pain.  Denies any acute shortness of breath   Objective: Vital signs in last 24 hours: Vitals:   09/03/19 0600 09/03/19 0800  BP: 137/77   Pulse: 79   Resp: 16   Temp:  98.6 F (37 C)  SpO2: 94%     Physical Exam:  General:  Alert, cooperative, no distress, appears stated age  Head:  Normocephalic, without obvious abnormality, atraumatic  Eyes:  , EOM's intact,   Lungs:    Fine basilar crackles, anterior exam only, no significant respiratory distress  Heart:   Irregularly irregular rate and rhythm,  Abdomen:    JP drain right upper quadrant, mild right upper quadrant and left upper quadrant tenderness to palpation, bowel sounds present.  No peritoneal signs  Extremities: Extremities normal, atraumatic, no  edema       Lab Results: Recent Labs    09/01/19 0229 09/03/19 0154  NA 135 137  K 3.6 3.7  CL 102 104  CO2 22 22  GLUCOSE 93 95  BUN 31* 20  CREATININE 1.52* 1.14*  CALCIUM 8.1* 8.5*   Recent Labs    09/01/19 0229 09/03/19 0154  AST 25 29  ALT 14 18  ALKPHOS 94 102  BILITOT 1.2 0.7  PROT 6.0* 6.5  ALBUMIN 2.7* 2.5*   Recent Labs    09/01/19 0229 09/02/19 0419 09/03/19 0154  WBC 10.9* 9.3 10.0  NEUTROABS 5.5  --   --   HGB 10.1* 10.0* 10.7*  HCT 31.1* 31.0* 33.1*  MCV 97.2 96.9 96.2  PLT 281 337 402*   Recent Labs    08/31/19 1226  LABPROT 13.8  INR 1.1      Assessment/Plan: -Bile leak s/p laparoscopic cholecystectomy.  Currently s/p IR guided drain placement. -History of A. fib.  Eliquis on hold -History of CHF.  Complaining of mild shortness of breath.  Recommendations ---------------------- -Continues to have a bilious drain output.  Proceed with ERCP today.  Heparin drip on hold since 4 AM.  On  Zosyn.  Risks (post ERCP pancreatitis, bleeding, infection, bowel perforation that could require surgery, sedation-related changes in cardiopulmonary systems), benefits (identification and possible treatment of source of symptoms, exclusion of certain causes of symptoms), and alternatives (watchful waiting, radiographic imaging studies, empiric medical treatment)  were explained to patient in detail and patient wishes to proceed.  Otis Brace MD, [place] 09/03/2019, 8:58 AM  Contact #  [phone]

## 2019-09-03 NOTE — Progress Notes (Signed)
PROGRESS NOTE    Kristen [name]  YHC:[id number] DOB: 1941/06/02 DOA: 08/28/2019 PCP: [name], MD   Brief Narrative:  Patient is a 78 female with history of anxiety, A. fib on Eliquis, hypertension, CKD with uncertain baseline, who presents to the emergency room with abdominal pain. Patient underwent laparoscopic cholecystectomy on 08/22/2019 and was recovering uneventfully back to home but started developing severe abdomen pain on right upper quadrant with radiation towards the right shoulder blade so she presented to the emergency department. In the emergency department she was found to be hypoxic, tachycardic. Chest x-ray was concerning for interstitial edema. BNP was elevated her blood work showed creatinine of 3.79 up from 1.3 ,a week earlier. CT abdomen/pelvis revealed fluid collection in the gallbladder fossa. General surgery consulted. HIDA scan done and itshowed biliary leak and no activity in the small bowel. While in the emergency department, she alsodeveloped A. fib with RVR and had to be started on Cardizem drip. Underwent biliary drainby IR on 08/30/19.Now the plan is to do an ERCP by GI   Assessment & Plan:   Principal Problem:   Acute renal failure (ARF) (HCC) Active Problems:   Hypertensive heart disease   Permanent atrial fibrillation (HCC)   Acute on chronic diastolic CHF (congestive heart failure) (HCC)   Situational mixed anxiety and depressive disorder   Intraabdominal fluid collection   A-fib (HCC)   Abdominal pain/biliary leak: No changes, HIDA scan was suggestive of biliary leak. presented with severe right upper quadrant pain. History of cholecystectomy on October 15. She had severe tenderness in the right upper quadrant. CT scan showed fluid collection in the gallbladder fossa. Underwent biliary drain by IR with yield ofsignificant amount of biliary fluid. GI, general surgery following.  ERCP Tuesday with choledocholithiasis noted  with removal of stone, appreciate GI input,  will restart Eliquis tomorrow  Acute kidney injury:She was also on ibuprofen and lisinopril at home. AKI improving 20/1.14. Monitor BMP.   Acute on chronic diastolicCHF/acute hypoxic respiratory failure:Not on oxygen at home. Elevated BNP. Had peripheral edema. Chest x-ray showed interstitial edema and she hadbilateral basal crackles. She was given few days of Lasix which has been stopped now because shelooked intravascularly depleted. Kidney function actuallyimproved with IV fluids. Echocardiogram showed ejection fraction of 60-65%, stable no further work-up  A. fib with DVV:OHYWV-PXTG at least 5 (age x2, gender, CHF, HTN). Went into A. fib in the emergency department. She was startedCardizem drip with improvement ,now stopped. On eliquis for anticoagulation which is on hold but will be restarted tomorrow,continue metoprolol for rate control    Hypertension: Currently blood pressure stable. Continue current medicines.  Hypokalemia: Supplemented with potassium.  DVT prophylaxis: SCD/Compression stockings  Code Status: Full code    Code Status Orders  (From admission, onward)         Start     Ordered   08/29/19 0425  Full code  Continuous     08/29/19 0424        Code Status History    This patient has a current code status but no historical code status.   Advance Care Planning Activity    Advance Directive Documentation     Most Recent Value  Type of Advance Directive  Healthcare Power of Attorney, Living will  Pre-existing out of facility DNR order (yellow form or pink MOST form)  -  "MOST" Form in Place?  -     Family Communication: Discussed in detail with patient Disposition Plan:   Patient  will remain inpatient, status post ERCP with stent placement, slowly advance diet with clear liquids, restart Eliquis tomorrow, patient not yet medically stable for discharge Consults called: None Admission  status: Inpatient   Consultants:   GI  Procedures:  Ct Abdomen Pelvis Wo Contrast  Result Date: 08/29/2019 CLINICAL DATA:  Acute generalized abdominal pain. Status post cholecystectomy 08/22/2019. Patient not a pain medication. EXAM: CT ABDOMEN AND PELVIS WITHOUT CONTRAST TECHNIQUE: Multidetector CT imaging of the abdomen and pelvis was performed following the standard protocol without IV contrast. COMPARISON:  CT 08/10/2010, no interval CT FINDINGS: Lower chest: Small right and trace left pleural effusion. Bilateral lower lobe atelectasis. Compressive atelectasis in the right middle lobe related to elevated hemidiaphragm. Cardiomegaly. Hepatobiliary: Post cholecystectomy with fluid collection in the gallbladder fossa measuring 6.5 x 2.5 cm. Fluid measures simple density. Tiny dependent hyperdensities in the fluid collection, images 27 and 28 series 2 are nonspecific may represent drop stones. Small volume of simple perihepatic free fluid. Common bile duct is poorly defined, however measures approximately 10 mm. No visualized choledocholithiasis. Suggestion of slight nodular hepatic contours. No evidence of focal hepatic lesion. Pancreas: No ductal dilatation or inflammation. Spleen: Normal in size without focal abnormality. Adrenals/Urinary Tract: Normal adrenal glands. Extrarenal pelvis configuration of the right greater than left kidney. No renal stones. No perinephric edema. Urinary bladder is physiologically distended. No wall thickening. No bladder stone. Stomach/Bowel: Ingested contrast in the stomach. No gastric wall thickening. Administered enteric contrast reaches mid distal small bowel. No small bowel obstruction. Normal appendix. Moderate stool in the proximal colon. Small volume of stool in the more distal colon. Mild distal descending diverticulosis, prominent diverticulosis in the sigmoid. No evidence of diverticulitis. Vascular/Lymphatic: Mild aortic atherosclerosis. No aneurysm. Definite  enlarged lymph nodes in the abdomen or pelvis. Reproductive: Atrophic uterus. No gross adnexal mass. Other: Small volume simple free fluid in the pelvis. Minimal fluid in the right pericolic gutter. No free air. Expected stranding in the subcutaneous tissues port sites. No subcutaneous fluid collection. Musculoskeletal: There are no acute or suspicious osseous abnormalities. Scoliosis and degenerative change in the spine. IMPRESSION: 1. Post recent cholecystectomy with fluid collection in the gallbladder fossa measuring 6.5 x 2.5 cm, may be postoperative seroma or biloma. Tiny dependent hyperdensities within the fluid collection are nonspecific, may represent drop stones or potentially surgical packing. Consider nuclear medicine hepatic biliary scan if there is concern for biloma. 2. Prominent common bile duct at 10 mm, may be normal post cholecystectomy. 3. Additional free fluid about the liver and tracking into the pelvis measures simple fluid density. 4. Small right pleural effusion.  Bibasilar atelectasis. 5. Colonic diverticulosis without diverticulitis. Aortic Atherosclerosis (ICD10-I70.0). Electronically Signed   By: Keith Rake M.D.   On: 08/29/2019 02:36   Dg Chest Port 1 View  Result Date: 08/28/2019 CLINICAL DATA:  CHF, shortness of breath EXAM: PORTABLE CHEST 1 VIEW COMPARISON:  None. FINDINGS: There is mild cardiomegaly. Pulmonary vascular congestion is seen. There is mildly increased interstitial markings throughout both lungs. There is blunting of the left costophrenic angle which could be due to a trace left pleural effusion. No acute osseous abnormality. IMPRESSION: Mild cardiomegaly and interstitial edema. Probable trace left pleural effusion. Electronically Signed   By: Prudencio Pair M.D.   On: 08/28/2019 22:34   Ct Image Guided Drainage By Percutaneous Catheter  Result Date: 08/30/2019 INDICATION: 78 year old female with post cholecystectomy bile leak. She presents for CT-guided  drain placement. EXAM: CT-guided drain placement MEDICATIONS: The patient  is currently admitted to the hospital and receiving intravenous antibiotics. The antibiotics were administered within an appropriate time frame prior to the initiation of the procedure. ANESTHESIA/SEDATION: Fentanyl 100 mcg IV; Versed 4 mg IV, 1 mg Dilaudid Moderate Sedation Time:  34 minutes The patient was continuously monitored during the procedure by the interventional radiology nurse under my direct supervision. COMPLICATIONS: None immediate. PROCEDURE: Informed written consent was obtained from the patient after a thorough discussion of the procedural risks, benefits and alternatives. All questions were addressed. A timeout was performed prior to the initiation of the procedure. A planning axial CT scan was performed. The fluid collection in the gallbladder fossa was successfully identified. A suitable skin entry site was selected and marked. Local anesthesia was attained by infiltration with 1% lidocaine. A small dermatotomy was made. Under intermittent CT guidance, an 18 gauge trocar needle was advanced into the fluid collection. A 0.035 wire was then advanced into the fluid collection. The needle was removed. The soft tissue tract was dilated to 10 [place] and a [place] all-purpose drainage catheter was advanced over the wire and formed in the fluid collection. Aspiration yields several 100 mL of bilious fluid. The catheter was secured in place with 0 Prolene suture and connected to JP bulb suction. Follow-up CT imaging demonstrates a well-positioned drainage catheter with significantly decreased fluid in the gallbladder fossa. However, some of the fluid has leaked into the subcutaneous soft tissues along the catheter tubing. IMPRESSION: Successful placement of 10 French drainage catheter into the gallbladder fossa with aspiration of several 100 mL bilious fluid. Electronically Signed   By: Jacqulynn Cadet M.D.   On:  08/30/2019 13:47   Nm Hepato Biliary Leak  Addendum Date: 08/29/2019   ADDENDUM REPORT: 08/29/2019 11:46 ADDENDUM: These results were called by telephone at the time of interpretation on 08/29/2019 at 11:46 am to provider Dr. Marlou Starks, who verbally acknowledged these results. Electronically Signed   By: Zetta Bills M.D.   On: 08/29/2019 11:46   Result Date: 08/29/2019 CLINICAL DATA:  History of suspected bile leak. EXAM: NUCLEAR MEDICINE HEPATOBILIARY IMAGING TECHNIQUE: Sequential images of the abdomen were obtained out to 60 minutes following intravenous administration of radiopharmaceutical. RADIOPHARMACEUTICALS:  5.1 mCi Tc-94m  Choletec IV COMPARISON:  CT study of 08/29/2019 FINDINGS: Prompt uptake of radiotracer into hepatic parenchyma with excretion into the biliary tree is noted. An amorphous collection of radiotracer is demonstrated, enlarging over time, over the right upper quadrant extending into the expected area of gallbladder fossa and porta hepatis. Likely tracking up over the liver as well. IMPRESSION: Nuclear medicine signs of biliary leak. No signs of small bowel activity. A call is out to the referring provider to discuss findings in the above case. Electronically Signed: By: Zetta Bills M.D. On: 08/29/2019 11:39     Antimicrobials:   Zosyn   Subjective: Patient seen preprocedure, still reported right upper quadrant pain no acute change Otherwise hemodynamically stable no events overnight  Objective: Vitals:   09/03/19 1228 09/03/19 1230 09/03/19 1240 09/03/19 1250  BP: 138/65 (!) 137/98 131/70 (!) 143/78  Pulse: (!) 103 75 95 89  Resp: 20 20 12  (!) 22  Temp:      TempSrc:      SpO2: 93% 92% 91% 93%  Weight:      Height:        Intake/Output Summary (Last 24 hours) at 09/03/2019 1254 Last data filed at 09/03/2019 1227 Gross per 24 hour  Intake 1861.79 ml  Output 2130 ml  Net -268.21 ml   Filed Weights   09/01/19 0300 09/02/19 0500 09/03/19 0351  Weight:  92.1 kg 97.1 kg 95.6 kg    Examination:  General exam: Appears calm and comfortable  Respiratory system: Clear to auscultation. Respiratory effort normal. Cardiovascular system: S1 & S2 heard, RRR. No JVD, murmurs, rubs, gallops or clicks. No pedal edema. Gastrointestinal system: Abdomen is nondistended, soft and mildly tender right upper quadrant. No organomegaly or masses felt. Normal bowel sounds heard. Central nervous system: Alert and oriented. No focal neurological deficits. Extremities: Warm well perfused, neurovascular intact Skin: No rashes, lesions or ulcers Psychiatry: Judgement and insight appear normal. Mood & affect appropriate.     Data Reviewed: I have personally reviewed following labs and imaging studies  CBC: Recent Labs  Lab 08/29/19 0526 08/30/19 0206 08/31/19 0204 09/01/19 0229 09/02/19 0419 09/03/19 0154  WBC 10.3 12.2* 11.1* 10.9* 9.3 10.0  NEUTROABS 7.2 8.9* 6.8 5.5  --   --   HGB 11.1* 11.2* 10.1* 10.1* 10.0* 10.7*  HCT 34.1* 34.9* 31.8* 31.1* 31.0* 33.1*  MCV 96.1 96.7 98.1 97.2 96.9 96.2  PLT 278 286 270 281 337 789*   Basic Metabolic Panel: Recent Labs  Lab 08/29/19 1508 08/30/19 0206 08/31/19 0204 09/01/19 0229 09/03/19 0154  NA 138 138 135 135 137  K 4.9 3.2* 3.3* 3.6 3.7  CL 97* 101 99 102 104  CO2 28 24 26 22 22   GLUCOSE 110* 99 108* 93 95  BUN 36* 34* 36* 31* 20  CREATININE 3.01* 2.57* 2.18* 1.52* 1.14*  CALCIUM 9.5 8.6* 8.2* 8.1* 8.5*   GFR: Estimated Creatinine Clearance: 43.1 mL/min (A) (by C-G formula based on SCr of 1.14 mg/dL (H)). Liver Function Tests: Recent Labs  Lab 08/28/19 1655 08/29/19 0526 08/30/19 0206 09/01/19 0229 09/03/19 0154  AST 25 22 20 25 29   ALT 16 15 13 14 18   ALKPHOS 80 78 79 94 102  BILITOT 1.1 1.4* 2.1* 1.2 0.7  PROT 7.8 6.8 6.2* 6.0* 6.5  ALBUMIN 3.6 3.2* 2.8* 2.7* 2.5*   Recent Labs  Lab 08/28/19 1655  LIPASE 42   No results for input(s): AMMONIA in the last 168 hours. Coagulation  Profile: Recent Labs  Lab 08/29/19 1805 08/31/19 1226  INR 1.1 1.1   Cardiac Enzymes: No results for input(s): CKTOTAL, CKMB, CKMBINDEX, TROPONINI in the last 168 hours. BNP (last 3 results) No results for input(s): PROBNP in the last 8760 hours. HbA1C: No results for input(s): HGBA1C in the last 72 hours. CBG: No results for input(s): GLUCAP in the last 168 hours. Lipid Profile: No results for input(s): CHOL, HDL, LDLCALC, TRIG, CHOLHDL, LDLDIRECT in the last 72 hours. Thyroid Function Tests: No results for input(s): TSH, T4TOTAL, FREET4, T3FREE, THYROIDAB in the last 72 hours. Anemia Panel: No results for input(s): VITAMINB12, FOLATE, FERRITIN, TIBC, IRON, RETICCTPCT in the last 72 hours. Sepsis Labs: No results for input(s): PROCALCITON, LATICACIDVEN in the last 168 hours.  Recent Results (from the past 240 hour(s))  SARS CORONAVIRUS 2 (TAT 6-24 HRS) Nasopharyngeal Nasopharyngeal Swab     Status: None   Collection Time: 08/28/19 11:25 PM   Specimen: Nasopharyngeal Swab  Result Value Ref Range Status   SARS Coronavirus 2 NEGATIVE NEGATIVE Final    Comment: (NOTE) SARS-CoV-2 target nucleic acids are NOT DETECTED. The SARS-CoV-2 RNA is generally detectable in upper and lower respiratory specimens during the acute phase of infection. Negative results do not preclude SARS-CoV-2 infection, do not  rule out co-infections with other pathogens, and should not be used as the sole basis for treatment or other patient management decisions. Negative results must be combined with clinical observations, patient history, and epidemiological information. The expected result is Negative. Fact Sheet for Patients: SugarRoll.be Fact Sheet for Healthcare Providers: https://www.woods-mathews.com/ This test is not yet approved or cleared by the [place] FDA and  has been authorized for detection and/or diagnosis of SARS-CoV-2 by FDA under an  Emergency Use Authorization (EUA). This EUA will remain  in effect (meaning this test can be used) for the duration of the COVID-19 declaration under Section 56 4(b)(1) of the Act, 21 U.S.C. section 360bbb-3(b)(1), unless the authorization is terminated or revoked sooner. Performed at [facility], [place] [address], [place], [place] [address]   MRSA PCR Screening     Status: None   Collection Time: 08/29/19  5:00 PM   Specimen: Nasal Mucosa; Nasopharyngeal  Result Value Ref Range Status   MRSA by PCR NEGATIVE NEGATIVE Final    Comment:        The GeneXpert MRSA Assay (FDA approved for NASAL specimens only), is one component of a comprehensive MRSA colonization surveillance program. It is not intended to diagnose MRSA infection nor to guide or monitor treatment for MRSA infections. Performed at [facility], [place] [address], [place], [place] [address]   Culture, blood (routine x 2)     Status: None (Preliminary result)   Collection Time: 08/30/19  8:10 AM   Specimen: BLOOD  Result Value Ref Range Status   Specimen Description   Final    BLOOD LEFT HAND Performed at [place] [address], [place], [place] [address]    Special Requests   Final    BOTTLES DRAWN AEROBIC AND ANAEROBIC Blood Culture adequate volume Performed at [place] [address], [place], [place] [address]    Culture   Final    NO GROWTH 4 DAYS Performed at [facility], [place] [address], [place], [place] [address]    Report Status PENDING  Incomplete  Culture, blood (routine x 2)     Status: None (Preliminary result)   Collection Time: 08/30/19  8:21 AM   Specimen: BLOOD  Result Value Ref Range Status   Specimen Description   Final    BLOOD RIGHT HAND Performed at [facility] [address], [place], [place] [address]    Special Requests   Final    BOTTLES DRAWN AEROBIC ONLY Blood Culture results may  not be optimal due to an inadequate volume of blood received in culture bottles Performed at [place] [address], [place], [place] [address]    Culture   Final    NO GROWTH 4 DAYS Performed at [facility], [place] [address], [place],  [address]    Report Status PENDING  Incomplete         Radiology Studies: No results found.      Scheduled Meds: . [MAR Hold] allopurinol  100 mg Oral Daily  . [MAR Hold] Chlorhexidine Gluconate Cloth  6 each Topical Daily  . [MAR Hold] diazepam  2.5 mg Oral QHS  . [MAR Hold] mouth rinse  15 mL Mouth Rinse BID  . [MAR Hold] metoprolol succinate  100 mg Oral Daily  . [MAR Hold] sodium chloride flush  3 mL Intravenous Q12H  . [MAR Hold] sodium chloride flush  3 mL Intravenous  Q12H  . [MAR Hold] sodium chloride flush  5 mL Intracatheter Q8H   Continuous Infusions: . [MAR Hold] sodium chloride    . [MAR Hold] piperacillin-tazobactam (ZOSYN)  IV Stopped (09/03/19 0719)     LOS: 5 days    Time spent: 1 min    Nicolette Bang, MD [facility]  If 7PM-7AM, please contact night-coverage  09/03/2019, 12:54 PM

## 2019-09-03 NOTE — Anesthesia Procedure Notes (Signed)
Procedure Name: Intubation Date/Time: 09/03/2019 11:23 AM Performed by: Raenette Rover, CRNA Pre-anesthesia Checklist: Patient identified, Emergency Drugs available, Suction available and Patient being monitored Patient Re-evaluated:Patient Re-evaluated prior to induction Oxygen Delivery Method: Circle system utilized Preoxygenation: Pre-oxygenation with 100% oxygen Induction Type: IV induction Ventilation: Mask ventilation without difficulty Laryngoscope Size: Miller and 3 Grade View: Grade I Tube type: Oral Tube size: 7.0 mm Number of attempts: 1 Airway Equipment and Method: Stylet Placement Confirmation: positive ETCO2,  ETT inserted through vocal cords under direct vision and breath sounds checked- equal and bilateral Secured at: 21 cm Tube secured with: Tape Dental Injury: Teeth and Oropharynx as per pre-operative assessment

## 2019-09-03 NOTE — Progress Notes (Signed)
Kristen [name] 11:18 AM  Subjective: Patient doing better than she was and we rediscussed her procedure and answered all of her questions  Objective: Vital signs stable afebrile no acute distress exam please see preassessment evaluation labs okay CT and hepatobiliary scan reviewed as well as op note  Assessment: Bile leak  Plan: The risks benefits methods and success rate of ERCP and stenting was discussed with the patient and will proceed with anesthesia assistance today with further work-up and plans pending those findings  [facility]  office [phone] After 5PM or if no answer call [phone]

## 2019-09-03 NOTE — Progress Notes (Signed)
S: patient feeling well, small BM overnight, tolerated diet yesterday O: BP 137/77   Pulse 79   Temp 98.6 F (37 C) (Oral)   Resp 16   Ht 5\' 2"  (1.575 m)   Wt 95.6 kg   SpO2 94%   BMI 38.55 kg/m  Gen: NAD Neuro: GCS 15 Abd: soft, ecchymosis in epigastrium, drain with bilious output  A/P 78 yo female s/p lap chole with cystic stump leak s/p IR drain -GI planning ERCP with stent today -continue abx

## 2019-09-04 ENCOUNTER — Encounter (HOSPITAL_COMMUNITY): Payer: Self-pay | Admitting: Gastroenterology

## 2019-09-04 LAB — CBC
HCT: 34.5 % — ABNORMAL LOW (ref 36.0–46.0)
Hemoglobin: 11.1 g/dL — ABNORMAL LOW (ref 12.0–15.0)
MCH: 31.4 pg (ref 26.0–34.0)
MCHC: 32.2 g/dL (ref 30.0–36.0)
MCV: 97.5 fL (ref 80.0–100.0)
Platelets: 405 10*3/uL — ABNORMAL HIGH (ref 150–400)
RBC: 3.54 MIL/uL — ABNORMAL LOW (ref 3.87–5.11)
RDW: 13.6 % (ref 11.5–15.5)
WBC: 10.7 10*3/uL — ABNORMAL HIGH (ref 4.0–10.5)
nRBC: 0 % (ref 0.0–0.2)

## 2019-09-04 LAB — COMPREHENSIVE METABOLIC PANEL
ALT: 23 U/L (ref 0–44)
AST: 35 U/L (ref 15–41)
Albumin: 2.7 g/dL — ABNORMAL LOW (ref 3.5–5.0)
Alkaline Phosphatase: 103 U/L (ref 38–126)
Anion gap: 10 (ref 5–15)
BUN: 22 mg/dL (ref 8–23)
CO2: 23 mmol/L (ref 22–32)
Calcium: 8.7 mg/dL — ABNORMAL LOW (ref 8.9–10.3)
Chloride: 103 mmol/L (ref 98–111)
Creatinine, Ser: 1.15 mg/dL — ABNORMAL HIGH (ref 0.44–1.00)
GFR calc Af Amer: 52 mL/min — ABNORMAL LOW (ref >60)
GFR calc non Af Amer: 45 mL/min — ABNORMAL LOW (ref >60)
Glucose, Bld: 106 mg/dL — ABNORMAL HIGH (ref 70–99)
Potassium: 3.6 mmol/L (ref 3.5–5.1)
Sodium: 136 mmol/L (ref 135–145)
Total Bilirubin: 0.7 mg/dL (ref 0.3–1.2)
Total Protein: 6.4 g/dL — ABNORMAL LOW (ref 6.5–8.1)

## 2019-09-04 LAB — LIPASE, BLOOD: Lipase: 2732 U/L — ABNORMAL HIGH (ref 11–51)

## 2019-09-04 LAB — CULTURE, BLOOD (ROUTINE X 2)
Culture: NO GROWTH
Culture: NO GROWTH
Special Requests: ADEQUATE

## 2019-09-04 MED ORDER — SODIUM CHLORIDE 0.9 % IV SOLN
INTRAVENOUS | Status: DC
  Administered 2019-09-04 – 2019-09-05 (×4): via INTRAVENOUS

## 2019-09-04 MED ORDER — APIXABAN 5 MG PO TABS
5.0000 mg | ORAL_TABLET | Freq: Two times a day (BID) | ORAL | Status: DC
  Administered 2019-09-04 – 2019-09-17 (×27): 5 mg via ORAL
  Filled 2019-09-04 (×19): qty 1
  Filled 2019-09-04: qty 2
  Filled 2019-09-04 (×8): qty 1

## 2019-09-04 NOTE — Progress Notes (Signed)
PROGRESS NOTE    Kristen [name]  PJA:[id number] DOB: [birth year] DOA: 08/28/2019 PCP: [name], MD   Brief Narrative:  Patient is a 78 female with history of anxiety, A. fib on Eliquis, hypertension, CKD with uncertain baseline, who presents to the emergency room with abdominal pain. Patient underwent laparoscopic cholecystectomy on 08/22/2019 and was recovering uneventfully back to home but started developing severe abdomen pain on right upper quadrant with radiation towards the right shoulder blade so she presented to the emergency department. In the emergency department she was found to be hypoxic, tachycardic. Chest x-ray was concerning for interstitial edema. BNP was elevated her blood work showed creatinine of 3.79 up from 1.3 ,a week earlier. CT abdomen/pelvis revealed fluid collection in the gallbladder fossa. General surgery consulted. HIDA scan done and itshowed biliary leak and no activity in the small bowel. While in the emergency department, she alsodeveloped A. fib with RVR and had to be started on Cardizem drip. Underwent biliary drainby IR on 08/30/19.Now the plan is to do an ERCP by GI   Assessment & Plan:   Principal Problem:   Acute renal failure (ARF) (HCC) Active Problems:   Hypertensive heart disease   Permanent atrial fibrillation (HCC)   Acute on chronic diastolic CHF (congestive heart failure) (HCC)   Situational mixed anxiety and depressive disorder   Intraabdominal fluid collection   A-fib (HCC)   Abdominal pain/biliary leak:No changes,HIDA scan was suggestive of biliary leak. presented with severe right upper quadrant pain. History of cholecystectomy on October 15. She had severe tenderness in the right upper quadrant. CT scan showed fluid collection in the gallbladder fossa. Underwent biliary drain by IR with yield ofsignificant amount of biliary fluid. GI, general surgery following.  ERCP Tuesday with choledocholithiasis noted  with removal of stone, appreciate GI input,patient with severe abdominal pain overnight although improving today, will restart Eliquis today  Acute kidney injury:She was also on ibuprofen and lisinopril at home. AKI improved. Monitor BMP.   Acute on chronic diastolicCHF/acute hypoxic respiratory failure:Not on oxygen at home. Elevated BNP. Had peripheral edema. Chest x-ray showed interstitial edema and she hadbilateral basal crackles. She was given few days of Lasix which has been stopped now because shelooked intravascularly depleted. Kidney function actuallyimproved with IV fluids. Echocardiogram showed ejection fraction of 60-65%, stable no further work-up  A. fib with HAL:[id number] at least 5 (age x2, gender, CHF, HTN). Went into A. fib in the emergency department. She was startedCardizem drip with improvement ,now stopped. On eliquis for anticoagulation which has been restarted 10/28,continue metoprolol for rate control    Hypertension: Currently blood pressure stable. Continue current medicines.  Hypokalemia: Supplemented with potassium.  DVT prophylaxis: BD:[id number]  Code Status: Full    Code Status Orders  (From admission, onward)         Start     Ordered   08/29/19 0425  Full code  Continuous     08/29/19 0424        Code Status History    This patient has a current code status but no historical code status.   Advance Care Planning Activity    Advance Directive Documentation     Most Recent Value  Type of Advance Directive  Healthcare Power of Attorney, Living will  Pre-existing out of facility DNR order (yellow form or pink MOST form)  -  "MOST" Form in Place?  -     Family Communication: Discussed with patient in detail Disposition Plan:   Patient remained inpatient  additional day for postoperative management of ERCP with abdominal pain, monitoring elevated lipase which is to be expected, aggressive IV fluid, and IV pain management.   Patient is not yet medically ready for discharge Consults called: None Admission status: Inpatient   Consultants:   GI  Procedures:  Ct Abdomen Pelvis Wo Contrast  Result Date: 08/29/2019 CLINICAL DATA:  Acute generalized abdominal pain. Status post cholecystectomy 08/22/2019. Patient not a pain medication. EXAM: CT ABDOMEN AND PELVIS WITHOUT CONTRAST TECHNIQUE: Multidetector CT imaging of the abdomen and pelvis was performed following the standard protocol without IV contrast. COMPARISON:  CT 08/10/2010, no interval CT FINDINGS: Lower chest: Small right and trace left pleural effusion. Bilateral lower lobe atelectasis. Compressive atelectasis in the right middle lobe related to elevated hemidiaphragm. Cardiomegaly. Hepatobiliary: Post cholecystectomy with fluid collection in the gallbladder fossa measuring 6.5 x 2.5 cm. Fluid measures simple density. Tiny dependent hyperdensities in the fluid collection, images 27 and 28 series 2 are nonspecific may represent drop stones. Small volume of simple perihepatic free fluid. Common bile duct is poorly defined, however measures approximately 10 mm. No visualized choledocholithiasis. Suggestion of slight nodular hepatic contours. No evidence of focal hepatic lesion. Pancreas: No ductal dilatation or inflammation. Spleen: Normal in size without focal abnormality. Adrenals/Urinary Tract: Normal adrenal glands. Extrarenal pelvis configuration of the right greater than left kidney. No renal stones. No perinephric edema. Urinary bladder is physiologically distended. No wall thickening. No bladder stone. Stomach/Bowel: Ingested contrast in the stomach. No gastric wall thickening. Administered enteric contrast reaches mid distal small bowel. No small bowel obstruction. Normal appendix. Moderate stool in the proximal colon. Small volume of stool in the more distal colon. Mild distal descending diverticulosis, prominent diverticulosis in the sigmoid. No evidence of  diverticulitis. Vascular/Lymphatic: Mild aortic atherosclerosis. No aneurysm. Definite enlarged lymph nodes in the abdomen or pelvis. Reproductive: Atrophic uterus. No gross adnexal mass. Other: Small volume simple free fluid in the pelvis. Minimal fluid in the right pericolic gutter. No free air. Expected stranding in the subcutaneous tissues port sites. No subcutaneous fluid collection. Musculoskeletal: There are no acute or suspicious osseous abnormalities. Scoliosis and degenerative change in the spine. IMPRESSION: 1. Post recent cholecystectomy with fluid collection in the gallbladder fossa measuring 6.5 x 2.5 cm, may be postoperative seroma or biloma. Tiny dependent hyperdensities within the fluid collection are nonspecific, may represent drop stones or potentially surgical packing. Consider nuclear medicine hepatic biliary scan if there is concern for biloma. 2. Prominent common bile duct at 10 mm, may be normal post cholecystectomy. 3. Additional free fluid about the liver and tracking into the pelvis measures simple fluid density. 4. Small right pleural effusion.  Bibasilar atelectasis. 5. Colonic diverticulosis without diverticulitis. Aortic Atherosclerosis (ICD10-I70.0). Electronically Signed   By: Keith Rake M.D.   On: 08/29/2019 02:36   Dg Chest Port 1 View  Result Date: 08/28/2019 CLINICAL DATA:  CHF, shortness of breath EXAM: PORTABLE CHEST 1 VIEW COMPARISON:  None. FINDINGS: There is mild cardiomegaly. Pulmonary vascular congestion is seen. There is mildly increased interstitial markings throughout both lungs. There is blunting of the left costophrenic angle which could be due to a trace left pleural effusion. No acute osseous abnormality. IMPRESSION: Mild cardiomegaly and interstitial edema. Probable trace left pleural effusion. Electronically Signed   By: Prudencio Pair M.D.   On: 08/28/2019 22:34   Dg Ercp  Result Date: 09/03/2019 CLINICAL DATA:  78 year old female with a history of  sphincterotomy for choledocholithiasis EXAM: ERCP TECHNIQUE: Multiple spot  images obtained with the fluoroscopic device and submitted for interpretation post-procedure. FLUOROSCOPY TIME:  Fluoroscopy Time: 4 minutes 29 seconds COMPARISON:  None. FINDINGS: Limited images during ERCP. Initial image demonstrates endoscope projecting over the upper abdomen with a safety wire in position in the extrahepatic biliary ducts and partial opacification with contrast. Subsequently there is evidence retrieval balloon deployment and placement of a plastic biliary stent. Ill-defined contrast between the common bile duct and the pigtail drainage catheter present. Pigtail drainage catheter in place in the upper abdomen. IMPRESSION: Limited images during ERCP demonstrates treatment of choledocholithiasis with deployment of a retrieval balloon and placement of a plastic biliary stent. Ill-defined contrast between the common bile duct and the pigtail drainage catheter may reflect leakage or refluxed contrast in a cystic duct stump. Please refer to the dictated operative report for full details of intraoperative findings and procedure. Electronically Signed   By: Corrie Mckusick D.O.   On: 09/03/2019 13:11   Ct Image Guided Drainage By Percutaneous Catheter  Result Date: 08/30/2019 INDICATION: 78 year old female with post cholecystectomy bile leak. She presents for CT-guided drain placement. EXAM: CT-guided drain placement MEDICATIONS: The patient is currently admitted to the hospital and receiving intravenous antibiotics. The antibiotics were administered within an appropriate time frame prior to the initiation of the procedure. ANESTHESIA/SEDATION: Fentanyl 100 mcg IV; Versed 4 mg IV, 1 mg Dilaudid Moderate Sedation Time:  34 minutes The patient was continuously monitored during the procedure by the interventional radiology nurse under my direct supervision. COMPLICATIONS: None immediate. PROCEDURE: Informed written consent was  obtained from the patient after a thorough discussion of the procedural risks, benefits and alternatives. All questions were addressed. A timeout was performed prior to the initiation of the procedure. A planning axial CT scan was performed. The fluid collection in the gallbladder fossa was successfully identified. A suitable skin entry site was selected and marked. Local anesthesia was attained by infiltration with 1% lidocaine. A small dermatotomy was made. Under intermittent CT guidance, an 18 gauge trocar needle was advanced into the fluid collection. A 0.035 wire was then advanced into the fluid collection. The needle was removed. The soft tissue tract was dilated to 10 [place] and a [place] all-purpose drainage catheter was advanced over the wire and formed in the fluid collection. Aspiration yields several 100 mL of bilious fluid. The catheter was secured in place with 0 Prolene suture and connected to JP bulb suction. Follow-up CT imaging demonstrates a well-positioned drainage catheter with significantly decreased fluid in the gallbladder fossa. However, some of the fluid has leaked into the subcutaneous soft tissues along the catheter tubing. IMPRESSION: Successful placement of 10 French drainage catheter into the gallbladder fossa with aspiration of several 100 mL bilious fluid. Electronically Signed   By: Jacqulynn Cadet M.D.   On: 08/30/2019 13:47   Nm Hepato Biliary Leak  Addendum Date: 08/29/2019   ADDENDUM REPORT: 08/29/2019 11:46 ADDENDUM: These results were called by telephone at the time of interpretation on 08/29/2019 at 11:46 am to provider Dr. Marlou Starks, who verbally acknowledged these results. Electronically Signed   By: Zetta Bills M.D.   On: 08/29/2019 11:46   Result Date: 08/29/2019 CLINICAL DATA:  History of suspected bile leak. EXAM: NUCLEAR MEDICINE HEPATOBILIARY IMAGING TECHNIQUE: Sequential images of the abdomen were obtained out to 60 minutes following intravenous  administration of radiopharmaceutical. RADIOPHARMACEUTICALS:  5.1 mCi Tc-7m  Choletec IV COMPARISON:  CT study of 08/29/2019 FINDINGS: Prompt uptake of radiotracer into hepatic parenchyma with excretion  into the biliary tree is noted. An amorphous collection of radiotracer is demonstrated, enlarging over time, over the right upper quadrant extending into the expected area of gallbladder fossa and porta hepatis. Likely tracking up over the liver as well. IMPRESSION: Nuclear medicine signs of biliary leak. No signs of small bowel activity. A call is out to the referring provider to discuss findings in the above case. Electronically Signed: By: Zetta Bills M.D. On: 08/29/2019 11:39     Antimicrobials:   Zosyn >10/24   Subjective: Patient reported significant abdominal pain overnight This has been improving slowly  Objective: Vitals:   09/04/19 0600 09/04/19 0800 09/04/19 0918 09/04/19 1200  BP: (!) 107/42  (!) 118/48   Pulse: 72  (!) 58   Resp: 16     Temp:  98.3 F (36.8 C)  97.8 F (36.6 C)  TempSrc:  Oral  Oral  SpO2: 92%     Weight:      Height:        Intake/Output Summary (Last 24 hours) at 09/04/2019 1312 Last data filed at 09/04/2019 0626 Gross per 24 hour  Intake 209.91 ml  Output 69 ml  Net 140.91 ml   Filed Weights   09/01/19 0300 09/02/19 0500 09/03/19 0351  Weight: 92.1 kg 97.1 kg 95.6 kg    Examination:  General exam: Appears calm and comfortable  Respiratory system: Clear to auscultation. Respiratory effort normal. Cardiovascular system: S1 & S2 heard, RRR. No JVD, murmurs, rubs, gallops or clicks. No pedal edema. Gastrointestinal system: Abdomen is nondistended, soft and mildly tender to palpation diffusely no focal findings. No organomegaly or masses felt. Normal bowel sounds heard. Central nervous system: Alert and oriented. No focal neurological deficits. Extremities: Warm well perfused, no edema Skin: No rashes, lesions or ulcers Psychiatry:  Judgement and insight appear normal. Mood & affect appropriate.     Data Reviewed: I have personally reviewed following labs and imaging studies  CBC: Recent Labs  Lab 08/29/19 0526 08/30/19 0206 08/31/19 0204 09/01/19 0229 09/02/19 0419 09/03/19 0154 09/04/19 0201  WBC 10.3 12.2* 11.1* 10.9* 9.3 10.0 10.7*  NEUTROABS 7.2 8.9* 6.8 5.5  --   --   --   HGB 11.1* 11.2* 10.1* 10.1* 10.0* 10.7* 11.1*  HCT 34.1* 34.9* 31.8* 31.1* 31.0* 33.1* 34.5*  MCV 96.1 96.7 98.1 97.2 96.9 96.2 97.5  PLT 278 286 270 281 337 402* 009*   Basic Metabolic Panel: Recent Labs  Lab 08/30/19 0206 08/31/19 0204 09/01/19 0229 09/03/19 0154 09/04/19 0858  NA 138 135 135 137 136  K 3.2* 3.3* 3.6 3.7 3.6  CL 101 99 102 104 103  CO2 24 26 22 22 23   GLUCOSE 99 108* 93 95 106*  BUN 34* 36* 31* 20 22  CREATININE 2.57* 2.18* 1.52* 1.14* 1.15*  CALCIUM 8.6* 8.2* 8.1* 8.5* 8.7*   GFR: Estimated Creatinine Clearance: 42.8 mL/min (A) (by C-G formula based on SCr of 1.15 mg/dL (H)). Liver Function Tests: Recent Labs  Lab 08/29/19 0526 08/30/19 0206 09/01/19 0229 09/03/19 0154 09/04/19 0858  AST 22 20 25 29  35  ALT 15 13 14 18 23   ALKPHOS 78 79 94 102 103  BILITOT 1.4* 2.1* 1.2 0.7 0.7  PROT 6.8 6.2* 6.0* 6.5 6.4*  ALBUMIN 3.2* 2.8* 2.7* 2.5* 2.7*   Recent Labs  Lab 08/28/19 1655 09/04/19 0858  LIPASE 42 2,732*   No results for input(s): AMMONIA in the last 168 hours. Coagulation Profile: Recent Labs  Lab 08/29/19 1805  08/31/19 1226  INR 1.1 1.1   Cardiac Enzymes: No results for input(s): CKTOTAL, CKMB, CKMBINDEX, TROPONINI in the last 168 hours. BNP (last 3 results) No results for input(s): PROBNP in the last 8760 hours. HbA1C: No results for input(s): HGBA1C in the last 72 hours. CBG: No results for input(s): GLUCAP in the last 168 hours. Lipid Profile: No results for input(s): CHOL, HDL, LDLCALC, TRIG, CHOLHDL, LDLDIRECT in the last 72 hours. Thyroid Function Tests: No  results for input(s): TSH, T4TOTAL, FREET4, T3FREE, THYROIDAB in the last 72 hours. Anemia Panel: No results for input(s): VITAMINB12, FOLATE, FERRITIN, TIBC, IRON, RETICCTPCT in the last 72 hours. Sepsis Labs: No results for input(s): PROCALCITON, LATICACIDVEN in the last 168 hours.  Recent Results (from the past 240 hour(s))  SARS CORONAVIRUS 2 (TAT 6-24 HRS) Nasopharyngeal Nasopharyngeal Swab     Status: None   Collection Time: 08/28/19 11:25 PM   Specimen: Nasopharyngeal Swab  Result Value Ref Range Status   SARS Coronavirus 2 NEGATIVE NEGATIVE Final    Comment: (NOTE) SARS-CoV-2 target nucleic acids are NOT DETECTED. The SARS-CoV-2 RNA is generally detectable in upper and lower respiratory specimens during the acute phase of infection. Negative results do not preclude SARS-CoV-2 infection, do not rule out co-infections with other pathogens, and should not be used as the sole basis for treatment or other patient management decisions. Negative results must be combined with clinical observations, patient history, and epidemiological information. The expected result is Negative. Fact Sheet for Patients: SugarRoll.be Fact Sheet for Healthcare Providers: https://www.woods-mathews.com/ This test is not yet approved or cleared by the [place] FDA and  has been authorized for detection and/or diagnosis of SARS-CoV-2 by FDA under an Emergency Use Authorization (EUA). This EUA will remain  in effect (meaning this test can be used) for the duration of the COVID-19 declaration under Section 56 4(b)(1) of the Act, 21 U.S.C. section 360bbb-3(b)(1), unless the authorization is terminated or revoked sooner. Performed at [facility], [place] [address] [place], [place] [address]   MRSA PCR Screening     Status: None   Collection Time: 08/29/19  5:00 PM   Specimen: Nasal Mucosa; Nasopharyngeal  Result Value Ref Range Status   MRSA by PCR  NEGATIVE NEGATIVE Final    Comment:        The GeneXpert MRSA Assay (FDA approved for NASAL specimens only), is one component of a comprehensive MRSA colonization surveillance program. It is not intended to diagnose MRSA infection nor to guide or monitor treatment for MRSA infections. Performed at [facility], [place] [address] [place], [place] [address]   Culture, blood (routine x 2)     Status: None   Collection Time: 08/30/19  8:10 AM   Specimen: BLOOD  Result Value Ref Range Status   Specimen Description   Final    BLOOD LEFT HAND Performed at [facility] [address], [place], [place] [address]    Special Requests   Final    BOTTLES DRAWN AEROBIC AND ANAEROBIC Blood Culture adequate volume Performed at [facility] [address] [place], [place] [address]    Culture   Final    NO GROWTH 5 DAYS Performed at [facility], [place] [address] [place], [place] [address]    Report Status 09/04/2019 FINAL  Final  Culture, blood (routine x 2)     Status: None   Collection Time: 08/30/19  8:21 AM   Specimen: BLOOD  Result Value Ref Range Status   Specimen Description   Final    BLOOD RIGHT HAND Performed at [facility] [address] [place], [place] [address]    Special Requests   Final    BOTTLES DRAWN AEROBIC ONLY Blood Culture results may not be optimal due to an inadequate volume of blood received in culture bottles Performed at [facility] [address], [place], [place] [address]    Culture   Final    NO GROWTH 5 DAYS Performed at [facility], [place] [address], [place], [place] [address]    Report Status 09/04/2019 FINAL  Final         Radiology Studies: Dg Ercp  Result Date: 09/03/2019 CLINICAL DATA:  78 year old female with a history of sphincterotomy for choledocholithiasis EXAM: ERCP TECHNIQUE: Multiple spot images obtained with the  fluoroscopic device and submitted for interpretation post-procedure. FLUOROSCOPY TIME:  Fluoroscopy Time: 4 minutes 29 seconds COMPARISON:  None. FINDINGS: Limited images during ERCP. Initial image demonstrates endoscope projecting over the upper abdomen with a safety wire in position in the extrahepatic biliary ducts and partial opacification with contrast. Subsequently there is evidence retrieval balloon deployment and placement of a plastic biliary stent. Ill-defined contrast between the common bile duct and the pigtail drainage catheter present. Pigtail drainage catheter in place in the upper abdomen. IMPRESSION: Limited images during ERCP demonstrates treatment of choledocholithiasis with deployment of a retrieval balloon and placement of a plastic biliary stent. Ill-defined contrast between the common bile duct and the pigtail drainage catheter may reflect leakage or refluxed contrast in a cystic duct stump. Please refer to the dictated operative report for full details of intraoperative findings and procedure. Electronically Signed   By: Corrie Mckusick D.O.   On: 09/03/2019 13:11        Scheduled Meds: . allopurinol  100 mg Oral Daily  . apixaban  5 mg Oral BID  . Chlorhexidine Gluconate Cloth  6 each Topical Daily  . diazepam  2.5 mg Oral QHS  . mouth rinse  15 mL Mouth Rinse BID  . metoprolol succinate  100 mg Oral Daily  . sodium chloride flush  3 mL Intravenous Q12H  . sodium chloride flush  3 mL Intravenous Q12H  . sodium chloride flush  5 mL Intracatheter Q8H   Continuous Infusions: . sodium chloride    . sodium chloride    . piperacillin-tazobactam (ZOSYN)  IV 3.375 g (09/04/19 0626)     LOS: 6 days    Time spent: 1 min    Nicolette Bang, MD [facility]  If 7PM-7AM, please contact night-coverage  09/04/2019, 1:12 PM

## 2019-09-04 NOTE — Plan of Care (Signed)
Pt voiced understanding of P.O medication,  Pt tolerating clear liquid diet

## 2019-09-04 NOTE — Progress Notes (Signed)
[facility] Progress Note  Kristen [name] 78 y.o. 09/07/1941  CC: Bile leak   Subjective: Underwent ERCP with sphincterotomy, removal removal of CBD stone and placement of plastic stent for bile leak yesterday.  She developed severe epigastric pain overnight which is getting better now.  Complaining of nausea but denied any vomiting.  JP drain has improved significantly.   ROS :   Negative for chest pain.  Denies any acute shortness of breath   Objective: Vital signs in last 24 hours: Vitals:   09/04/19 0800 09/04/19 0918  BP:  (!) 118/48  Pulse:  (!) 58  Resp:    Temp: 98.3 F (36.8 C)   SpO2:      Physical Exam:  General:  Alert, cooperative, no distress, appears stated age  Head:  Normocephalic, without obvious abnormality, atraumatic  Eyes:  , EOM's intact,   Lungs:    Fine basilar crackles, anterior exam only, no significant respiratory distress  Heart:   Irregularly irregular rate and rhythm,  Abdomen:    JP drain right upper quadrant with very minimal output, epigastric tenderness to palpation, bowel sounds present.  No peritoneal signs  Extremities: Extremities normal, atraumatic, no  edema       Lab Results: Recent Labs    09/03/19 0154 09/04/19 0858  NA 137 136  K 3.7 3.6  CL 104 103  CO2 22 23  GLUCOSE 95 106*  BUN 20 22  CREATININE 1.14* 1.15*  CALCIUM 8.5* 8.7*   Recent Labs    09/03/19 0154 09/04/19 0858  AST 29 35  ALT 18 23  ALKPHOS 102 103  BILITOT 0.7 0.7  PROT 6.5 6.4*  ALBUMIN 2.5* 2.7*   Recent Labs    09/03/19 0154 09/04/19 0201  WBC 10.0 10.7*  HGB 10.7* 11.1*  HCT 33.1* 34.5*  MCV 96.2 97.5  PLT 402* 405*   No results for input(s): LABPROT, INR in the last 72 hours.    Assessment/Plan: -Bile leak s/p laparoscopic cholecystectomy.  Status post ERCP with sphincterotomy, removal of CBD stone and CBD stent placement yesterday. -Epigastric abdominal pain.  Could be post ERCP pancreatitis. -History of A.  fib.  Eliquis on hold -History of CHF.  Complaining of mild shortness of breath.  Recommendations ---------------------- -severe epigastric pain could be from post ERCP pancreatitis.  Elevation of lipase is expected after ERCP even in the  absence of pancreatitis. -Start IV hydration at 150 cc/h for now. -Continue current diet -Okay to resume anticoagulation from GI standpoint. -GI will follow  Otis Brace MD, [place] 09/04/2019, 12:13 PM  Contact #  [phone]

## 2019-09-04 NOTE — Progress Notes (Signed)
S: worsened epigastric pain yesterday and overnight, unable to sleep, pain after drinking gingerale O: BP (!) 107/42   Pulse 72   Temp 98.7 F (37.1 C)   Resp 16   Ht 5\' 2"  (1.575 m)   Wt 95.6 kg   SpO2 92%   BMI 38.55 kg/m  Gen: NAD Neuro: AOx4 Abd: soft, tender in epigastrium, drain with thin yellow drainage, ecchymosis over incisions  A/P 78 yo female s/p lap chole with complication of bile leak, POD 1 ERCP with stone extraction and stent placement for leak -check liver profile and lipase for increased pain -if drain output decreases can stop antibiotics later today as bilious drainage is now controlled -can advance diet if no postprandial pain worsening

## 2019-09-05 LAB — COMPREHENSIVE METABOLIC PANEL
ALT: 25 U/L (ref 0–44)
AST: 38 U/L (ref 15–41)
Albumin: 2.7 g/dL — ABNORMAL LOW (ref 3.5–5.0)
Alkaline Phosphatase: 113 U/L (ref 38–126)
Anion gap: 9 (ref 5–15)
BUN: 19 mg/dL (ref 8–23)
CO2: 20 mmol/L — ABNORMAL LOW (ref 22–32)
Calcium: 8.5 mg/dL — ABNORMAL LOW (ref 8.9–10.3)
Chloride: 104 mmol/L (ref 98–111)
Creatinine, Ser: 0.98 mg/dL (ref 0.44–1.00)
GFR calc Af Amer: >60 mL/min (ref >60)
GFR calc non Af Amer: 55 mL/min — ABNORMAL LOW (ref >60)
Glucose, Bld: 98 mg/dL (ref 70–99)
Potassium: 4 mmol/L (ref 3.5–5.1)
Sodium: 133 mmol/L — ABNORMAL LOW (ref 135–145)
Total Bilirubin: 1 mg/dL (ref 0.3–1.2)
Total Protein: 6.5 g/dL (ref 6.5–8.1)

## 2019-09-05 LAB — CBC
HCT: 33.1 % — ABNORMAL LOW (ref 36.0–46.0)
Hemoglobin: 10.6 g/dL — ABNORMAL LOW (ref 12.0–15.0)
MCH: 31.1 pg (ref 26.0–34.0)
MCHC: 32 g/dL (ref 30.0–36.0)
MCV: 97.1 fL (ref 80.0–100.0)
Platelets: 422 10*3/uL — ABNORMAL HIGH (ref 150–400)
RBC: 3.41 MIL/uL — ABNORMAL LOW (ref 3.87–5.11)
RDW: 14 % (ref 11.5–15.5)
WBC: 12.6 10*3/uL — ABNORMAL HIGH (ref 4.0–10.5)
nRBC: 0 % (ref 0.0–0.2)

## 2019-09-05 MED ORDER — PANTOPRAZOLE SODIUM 40 MG PO TBEC
40.0000 mg | DELAYED_RELEASE_TABLET | Freq: Every day | ORAL | Status: DC
  Administered 2019-09-05 – 2019-09-17 (×13): 40 mg via ORAL
  Filled 2019-09-05 (×13): qty 1

## 2019-09-05 MED ORDER — IPRATROPIUM-ALBUTEROL 0.5-2.5 (3) MG/3ML IN SOLN
3.0000 mL | Freq: Four times a day (QID) | RESPIRATORY_TRACT | Status: DC | PRN
  Administered 2019-09-05: 19:00:00 3 mL via RESPIRATORY_TRACT
  Filled 2019-09-05: qty 3

## 2019-09-05 NOTE — Progress Notes (Signed)
Referring Physician(s): Paulita Fujita, W  Supervising Physician: Dr. Anselm Pancoast  Patient Status:  Kristen [name] IP  Chief Complaint:   abdominal pain, biloma  Subjective: Pt resting comfortably; still has some RUQ discomfort but improved since abd fluid drainage. Now s/p ERCP with sphincterotomy, CBD stone removal and stent placement.   Allergies: Flecainide acetate  Medications:  Current Facility-Administered Medications:  .  0.9 %  sodium chloride infusion, , Intravenous, Continuous, Brahmb[place]t, Parag, MD, Last Rate: 150 mL/hr at 09/05/19 0610 .  acetaminophen (TYLENOL) tablet 650 mg, 650 mg, Oral, Q6H PRN, 650 mg at 09/05/19 0302 **OR** acetaminophen (TYLENOL) suppository 650 mg, 650 mg, Rectal, Q6H PRN, Clarene Essex, MD .  allopurinol (ZYLOPRIM) tablet 100 mg, 100 mg, Oral, Daily, Clarene Essex, MD, 100 mg at 09/05/19 0931 .  apixaban (ELIQUIS) tablet 5 mg, 5 mg, Oral, BID, Spongberg, Audie Pinto, MD, 5 mg at 09/05/19 0931 .  Chlorhexidine Gluconate Cloth 2 % PADS 6 each, 6 each, Topical, Daily, Clarene Essex, MD, 6 each at 09/05/19 [phone] .  diazepam (VALIUM) tablet 2.5 mg, 2.5 mg, Oral, QHS, Magod, Altamese Dilling, MD, 2.5 mg at 09/04/19 2200 .  fentaNYL (SUBLIMAZE) injection 25-50 mcg, 25-50 mcg, Intravenous, Q2H PRN, Clarene Essex, MD, 25 mcg at 09/05/19 [phone] .  MEDLINE mouth rinse, 15 mL, Mouth Rinse, BID, Clarene Essex, MD, 15 mL at 09/05/19 0934 .  metoprolol succinate (TOPROL-XL) 24 hr tablet 100 mg, 100 mg, Oral, Daily, Clarene Essex, MD, 100 mg at 09/05/19 0931 .  ondansetron (ZOFRAN) injection 4 mg, 4 mg, Intravenous, Q6H PRN, Clarene Essex, MD, 4 mg at 09/05/19 0932 .  piperacillin-tazobactam (ZOSYN) IVPB 3.375 g, 3.375 g, Intravenous, Q8H, Magod, Altamese Dilling, MD, Last Rate: 12.5 mL/hr at 09/05/19 0622, 3.375 g at 09/05/19 0622 .  polyvinyl alcohol (LIQUIFILM TEARS) 1.4 % ophthalmic solution 1 drop, 1 drop, Both Eyes, PRN, Clarene Essex, MD, 1 drop at 09/03/19 2326 .  sodium chloride flush (NS) 0.9 % injection 5 mL,  5 mL, Intracatheter, Q8H, Magod, Marc, MD, 5 mL at 09/05/19 0258    Vital Signs: BP (!) 151/72   Pulse 89   Temp 97.7 F (36.5 C) (Oral)   Resp 19   Ht 5\' 2"  (1.575 m)   Wt 95 kg   SpO2 94%   BMI 38.31 kg/m   Physical Exam  Awake/alert; RUQ drain intact, dressing dry, site mildly tender, thin yellow   Imaging: Dg Ercp  Result Date: 09/03/2019 CLINICAL DATA:  78 year old female with a history of sphincterotomy for choledocholithiasis EXAM: ERCP TECHNIQUE: Multiple spot images obtained with the fluoroscopic device and submitted for interpretation post-procedure. FLUOROSCOPY TIME:  Fluoroscopy Time: 4 minutes 29 seconds COMPARISON:  None. FINDINGS: Limited images during ERCP. Initial image demonstrates endoscope projecting over the upper abdomen with a safety wire in position in the extrahepatic biliary ducts and partial opacification with contrast. Subsequently there is evidence retrieval balloon deployment and placement of a plastic biliary stent. Ill-defined contrast between the common bile duct and the pigtail drainage catheter present. Pigtail drainage catheter in place in the upper abdomen. IMPRESSION: Limited images during ERCP demonstrates treatment of choledocholithiasis with deployment of a retrieval balloon and placement of a plastic biliary stent. Ill-defined contrast between the common bile duct and the pigtail drainage catheter may reflect leakage or refluxed contrast in a cystic duct stump. Please refer to the dictated operative report for full details of intraoperative findings and procedure. Electronically Signed   By: Corrie Mckusick D.O.   On: 09/03/2019  13:11    Labs:  CBC: Recent Labs    09/02/19 0419 09/03/19 0154 09/04/19 0201 09/05/19 0202  WBC 9.3 10.0 10.7* 12.6*  HGB 10.0* 10.7* 11.1* 10.6*  HCT 31.0* 33.1* 34.5* 33.1*  PLT 337 402* 405* 422*    COAGS: Recent Labs    08/29/19 1805 08/31/19 1226  INR 1.1 1.1  APTT  --  32    BMP: Recent Labs     09/01/19 0229 09/03/19 0154 09/04/19 0858 09/05/19 0202  NA 135 137 136 133*  K 3.6 3.7 3.6 4.0  CL 102 104 103 104  CO2 22 22 23  20*  GLUCOSE 93 95 106* 98  BUN 31* 20 22 19   CALCIUM 8.1* 8.5* 8.7* 8.5*  CREATININE 1.52* 1.14* 1.15* 0.98  GFRNONAA 32* 46* 45* 55*  GFRAA 37* 53* 52* >60    LIVER FUNCTION TESTS: Recent Labs    09/01/19 0229 09/03/19 0154 09/04/19 0858 09/05/19 0202  BILITOT 1.2 0.7 0.7 1.0  AST 25 29 35 38  ALT 14 18 23 25   ALKPHOS 94 102 103 113  PROT 6.0* 6.5 6.4* 6.5  ALBUMIN 2.7* 2.5* 2.7* 2.7*    Assessment and Plan: Pt s/p lap chole 10/15; now with GB fossa fluid collection/biloma, s/p drain placement 10/23;  s/p ERCP with sphincterotomy, CBD stone removal and stent placement 10/27 Drain stable. Expect output to decrease now that CBD stent in place. IR following along    Electronically Signed: Ascencion Dike, PA-C 09/05/2019, 9:40 AM   I spent a total of 15 minutes at the the patient's bedside AND on the patient's hospital floor or unit, greater than 50% of which was counseling/coordinating care for gallbladder fossa fluid collection drain    Patient ID: Kristen [name], female   DOB: 03-07-1941, 78 y.o.   MRN: [id number]

## 2019-09-05 NOTE — Progress Notes (Signed)
S: pain much improved, no appetite, did not get out of bed yesterday O: BP (!) 151/72   Pulse 89   Temp 97.7 F (36.5 C) (Oral)   Resp 19   Ht 5\' 2"  (1.575 m)   Wt 95 kg   SpO2 94%   BMI 38.31 kg/m  Gen: NAD Neuro: AOx4 Abd: soft, NT, epigastric ecchymosis, drain with minimal golden output  A/P 78 yo female s/p lap chole with bile leak s/p IR drain and ERCP/stent -stop abx -up to chair -can advance diet if patient wants more options

## 2019-09-05 NOTE — Progress Notes (Signed)
PROGRESS NOTE    Kristen [name]  JKD:[id number] DOB: 1941-02-24 DOA: 08/28/2019 PCP: [name], MD   Brief Narrative:  Patient is a 68 female with history of anxiety, A. fib on Eliquis, hypertension, CKD with uncertain baseline, who presents to the emergency room with abdominal pain. Patient underwent laparoscopic cholecystectomy on 08/22/2019 and was recovering uneventfully back to home but started developing severe abdomen pain on right upper quadrant with radiation towards the right shoulder blade so she presented to the emergency department. In the emergency department she was found to be hypoxic, tachycardic. Chest x-ray was concerning for interstitial edema. BNP was elevated her blood work showed creatinine of 3.79 up from 1.3 ,a week earlier. CT abdomen/pelvis revealed fluid collection in the gallbladder fossa. General surgery consulted. HIDA scan done and itshowed biliary leak and no activity in the small bowel. While in the emergency department, she alsodeveloped A. fib with RVR and had to be started on Cardizem drip.  Underwent biliary drainby IR on 08/30/19. ERCP by GI 10/27 with stent placement   Assessment & Plan:   Principal Problem:   Acute renal failure (ARF) ([place]) Active Problems:   Hypertensive heart disease   Permanent atrial fibrillation (HCC)   Acute on chronic diastolic CHF (congestive heart failure) (HCC)   Situational mixed anxiety and depressive disorder   Intraabdominal fluid collection   A-fib (HCC)   Abdominal pain/biliary leak:HIDA scan was suggestive of biliary leak. presented with severe right upper quadrant pain. History of cholecystectomy on October 15. She had severe tenderness in the right upper quadrant. CT scan showed fluid collection in the gallbladder fossa.  -Underwent biliary drain by IR with yield ofsignificant amount of biliary fluid.  -GI, general surgery following.   -ERCP Tuesday with choledocholithiasis noted  with removal of stone, appreciate GI input, -patient with severe abdominal pain postop although significantly improved 10/29, restarted Eliquis 10/28,  -continue with clears today, consider advancing tomorrow,  -c/w zosyn one additional day 2/2 bump in WBC, if stabilizes then d/c 10/30  Acute kidney injury:She was also on ibuprofen and lisinopril at home. AKI improved. Monitor BMP.   Acute on chronic diastolicCHF/acute hypoxic respiratory failure:Not on oxygen at home. Elevated BNP. Had peripheral edema. Chest x-ray showed interstitial edema and she hadbilateral basal crackles. She was given few days of Lasix which has been stopped now because shelooked intravascularly depleted. Kidney function actuallyimproved with IV fluids. Echocardiogram showed ejection fraction of 60-65%, stable no further work-up  A. fib with KDX:IPJAS-NKNL at least 5 (age x2, gender, CHF, HTN). Went into A. fib in the emergency department. She was startedCardizem drip with improvement ,now stopped. On eliquis for anticoagulation which has been restarted 10/28,continue metoprolol for rate control   Hypertension: Currently blood pressure stable. Continue current medicines.  Hypokalemia: Supplemented with potassium.  DVT prophylaxis: ZJ:[id number]  Code Status: Full    Code Status Orders  (From admission, onward)         Start     Ordered   08/29/19 0425  Full code  Continuous     08/29/19 0424        Code Status History    This patient has a current code status but no historical code status.   Advance Care Planning Activity    Advance Directive Documentation     Most Recent Value  Type of Advance Directive  Healthcare Power of Attorney, Living will  Pre-existing out of facility DNR order (yellow form or pink MOST form)  -  "  MOST" Form in Place?  -     Family Communication: Discussed with patient in detail Disposition Plan:   Patient remained inpatient for continued IV  antibiotics, slow advancing of diet, patient not able to tolerate p.o. intake, still with abdominal pain requiring IV IV pain meds.  Patient not medically ready for discharge Consults called: None Admission status: Inpatient   Consultants:   GI, general surgery  Procedures:  Ct Abdomen Pelvis Wo Contrast  Result Date: 08/29/2019 CLINICAL DATA:  Acute generalized abdominal pain. Status post cholecystectomy 08/22/2019. Patient not a pain medication. EXAM: CT ABDOMEN AND PELVIS WITHOUT CONTRAST TECHNIQUE: Multidetector CT imaging of the abdomen and pelvis was performed following the standard protocol without IV contrast. COMPARISON:  CT 08/10/2010, no interval CT FINDINGS: Lower chest: Small right and trace left pleural effusion. Bilateral lower lobe atelectasis. Compressive atelectasis in the right middle lobe related to elevated hemidiaphragm. Cardiomegaly. Hepatobiliary: Post cholecystectomy with fluid collection in the gallbladder fossa measuring 6.5 x 2.5 cm. Fluid measures simple density. Tiny dependent hyperdensities in the fluid collection, images 27 and 28 series 2 are nonspecific may represent drop stones. Small volume of simple perihepatic free fluid. Common bile duct is poorly defined, however measures approximately 10 mm. No visualized choledocholithiasis. Suggestion of slight nodular hepatic contours. No evidence of focal hepatic lesion. Pancreas: No ductal dilatation or inflammation. Spleen: Normal in size without focal abnormality. Adrenals/Urinary Tract: Normal adrenal glands. Extrarenal pelvis configuration of the right greater than left kidney. No renal stones. No perinephric edema. Urinary bladder is physiologically distended. No wall thickening. No bladder stone. Stomach/Bowel: Ingested contrast in the stomach. No gastric wall thickening. Administered enteric contrast reaches mid distal small bowel. No small bowel obstruction. Normal appendix. Moderate stool in the proximal colon.  Small volume of stool in the more distal colon. Mild distal descending diverticulosis, prominent diverticulosis in the sigmoid. No evidence of diverticulitis. Vascular/Lymphatic: Mild aortic atherosclerosis. No aneurysm. Definite enlarged lymph nodes in the abdomen or pelvis. Reproductive: Atrophic uterus. No gross adnexal mass. Other: Small volume simple free fluid in the pelvis. Minimal fluid in the right pericolic gutter. No free air. Expected stranding in the subcutaneous tissues port sites. No subcutaneous fluid collection. Musculoskeletal: There are no acute or suspicious osseous abnormalities. Scoliosis and degenerative change in the spine. IMPRESSION: 1. Post recent cholecystectomy with fluid collection in the gallbladder fossa measuring 6.5 x 2.5 cm, may be postoperative seroma or biloma. Tiny dependent hyperdensities within the fluid collection are nonspecific, may represent drop stones or potentially surgical packing. Consider nuclear medicine hepatic biliary scan if there is concern for biloma. 2. Prominent common bile duct at 10 mm, may be normal post cholecystectomy. 3. Additional free fluid about the liver and tracking into the pelvis measures simple fluid density. 4. Small right pleural effusion.  Bibasilar atelectasis. 5. Colonic diverticulosis without diverticulitis. Aortic Atherosclerosis (ICD10-I70.0). Electronically Signed   By: Keith Rake M.D.   On: 08/29/2019 02:36   Dg Chest Port 1 View  Result Date: 08/28/2019 CLINICAL DATA:  CHF, shortness of breath EXAM: PORTABLE CHEST 1 VIEW COMPARISON:  None. FINDINGS: There is mild cardiomegaly. Pulmonary vascular congestion is seen. There is mildly increased interstitial markings throughout both lungs. There is blunting of the left costophrenic angle which could be due to a trace left pleural effusion. No acute osseous abnormality. IMPRESSION: Mild cardiomegaly and interstitial edema. Probable trace left pleural effusion. Electronically  Signed   By: Prudencio Pair M.D.   On: 08/28/2019 22:34  Dg Ercp  Result Date: 09/03/2019 CLINICAL DATA:  78 year old female with a history of sphincterotomy for choledocholithiasis EXAM: ERCP TECHNIQUE: Multiple spot images obtained with the fluoroscopic device and submitted for interpretation post-procedure. FLUOROSCOPY TIME:  Fluoroscopy Time: 4 minutes 29 seconds COMPARISON:  None. FINDINGS: Limited images during ERCP. Initial image demonstrates endoscope projecting over the upper abdomen with a safety wire in position in the extrahepatic biliary ducts and partial opacification with contrast. Subsequently there is evidence retrieval balloon deployment and placement of a plastic biliary stent. Ill-defined contrast between the common bile duct and the pigtail drainage catheter present. Pigtail drainage catheter in place in the upper abdomen. IMPRESSION: Limited images during ERCP demonstrates treatment of choledocholithiasis with deployment of a retrieval balloon and placement of a plastic biliary stent. Ill-defined contrast between the common bile duct and the pigtail drainage catheter may reflect leakage or refluxed contrast in a cystic duct stump. Please refer to the dictated operative report for full details of intraoperative findings and procedure. Electronically Signed   By: Corrie Mckusick D.O.   On: 09/03/2019 13:11   Ct Image Guided Drainage By Percutaneous Catheter  Result Date: 08/30/2019 INDICATION: 78 year old female with post cholecystectomy bile leak. She presents for CT-guided drain placement. EXAM: CT-guided drain placement MEDICATIONS: The patient is currently admitted to the hospital and receiving intravenous antibiotics. The antibiotics were administered within an appropriate time frame prior to the initiation of the procedure. ANESTHESIA/SEDATION: Fentanyl 100 mcg IV; Versed 4 mg IV, 1 mg Dilaudid Moderate Sedation Time:  34 minutes The patient was continuously monitored during the  procedure by the interventional radiology nurse under my direct supervision. COMPLICATIONS: None immediate. PROCEDURE: Informed written consent was obtained from the patient after a thorough discussion of the procedural risks, benefits and alternatives. All questions were addressed. A timeout was performed prior to the initiation of the procedure. A planning axial CT scan was performed. The fluid collection in the gallbladder fossa was successfully identified. A suitable skin entry site was selected and marked. Local anesthesia was attained by infiltration with 1% lidocaine. A small dermatotomy was made. Under intermittent CT guidance, an 18 gauge trocar needle was advanced into the fluid collection. A 0.035 wire was then advanced into the fluid collection. The needle was removed. The soft tissue tract was dilated to 10 [place] and a [place] all-purpose drainage catheter was advanced over the wire and formed in the fluid collection. Aspiration yields several 100 mL of bilious fluid. The catheter was secured in place with 0 Prolene suture and connected to JP bulb suction. Follow-up CT imaging demonstrates a well-positioned drainage catheter with significantly decreased fluid in the gallbladder fossa. However, some of the fluid has leaked into the subcutaneous soft tissues along the catheter tubing. IMPRESSION: Successful placement of 10 French drainage catheter into the gallbladder fossa with aspiration of several 100 mL bilious fluid. Electronically Signed   By: Jacqulynn Cadet M.D.   On: 08/30/2019 13:47   Nm Hepato Biliary Leak  Addendum Date: 08/29/2019   ADDENDUM REPORT: 08/29/2019 11:46 ADDENDUM: These results were called by telephone at the time of interpretation on 08/29/2019 at 11:46 am to provider Dr. Marlou Starks, who verbally acknowledged these results. Electronically Signed   By: Zetta Bills M.D.   On: 08/29/2019 11:46   Result Date: 08/29/2019 CLINICAL DATA:  History of suspected bile leak.  EXAM: NUCLEAR MEDICINE HEPATOBILIARY IMAGING TECHNIQUE: Sequential images of the abdomen were obtained out to 60 minutes following intravenous administration of radiopharmaceutical. RADIOPHARMACEUTICALS:  5.1 mCi Tc-41m  Choletec IV COMPARISON:  CT study of 08/29/2019 FINDINGS: Prompt uptake of radiotracer into hepatic parenchyma with excretion into the biliary tree is noted. An amorphous collection of radiotracer is demonstrated, enlarging over time, over the right upper quadrant extending into the expected area of gallbladder fossa and porta hepatis. Likely tracking up over the liver as well. IMPRESSION: Nuclear medicine signs of biliary leak. No signs of small bowel activity. A call is out to the referring provider to discuss findings in the above case. Electronically Signed: By: Zetta Bills M.D. On: 08/29/2019 11:39     Antimicrobials:   Zosyn >10/24, plan to stop tomorrow 10/30 if white count stable and afebrile   Subjective: Patient reports abdominal pain that she experienced post ERCP is improved significantly Some nausea with p.o. intake only on clears Patient was seen with general surgery concurrently at bedside  Objective: Vitals:   09/05/19 0818 09/05/19 0931 09/05/19 1000 09/05/19 1148  BP:  (!) 151/72 136/72   Pulse:  89 63   Resp:   (!) 25   Temp: 97.7 F (36.5 C)   97.7 F (36.5 C)  TempSrc: Oral   Oral  SpO2:   91%   Weight:      Height:        Intake/Output Summary (Last 24 hours) at 09/05/2019 1239 Last data filed at 09/05/2019 1100 Gross per 24 hour  Intake 3254.92 ml  Output 1480 ml  Net 1774.92 ml   Filed Weights   09/02/19 0500 09/03/19 0351 09/05/19 0500  Weight: 97.1 kg 95.6 kg 95 kg    Examination:  General exam: Appears calm and comfortable  Respiratory system: Clear to auscultation. Respiratory effort normal. Cardiovascular system: S1 & S2 heard, RRR. No JVD, murmurs, rubs, gallops or clicks. No pedal edema. Gastrointestinal system: Drain in  place right side no drainage in it, abdomen mildly tender to palpation diffusely no focal findings no rebound no guarding Central nervous system: Alert and oriented. No focal neurological deficits. Extremities: All 4 extremities freely no focal deficits warm and well-perfused. Skin: No rashes, lesions or ulcers Psychiatry: Judgement and insight appear normal. Mood & affect appropriate.     Data Reviewed: I have personally reviewed following labs and imaging studies  CBC: Recent Labs  Lab 08/30/19 0206 08/31/19 0204 09/01/19 0229 09/02/19 0419 09/03/19 0154 09/04/19 0201 09/05/19 0202  WBC 12.2* 11.1* 10.9* 9.3 10.0 10.7* 12.6*  NEUTROABS 8.9* 6.8 5.5  --   --   --   --   HGB 11.2* 10.1* 10.1* 10.0* 10.7* 11.1* 10.6*  HCT 34.9* 31.8* 31.1* 31.0* 33.1* 34.5* 33.1*  MCV 96.7 98.1 97.2 96.9 96.2 97.5 97.1  PLT 286 270 281 337 402* 405* 323*   Basic Metabolic Panel: Recent Labs  Lab 08/31/19 0204 09/01/19 0229 09/03/19 0154 09/04/19 0858 09/05/19 0202  NA 135 135 137 136 133*  K 3.3* 3.6 3.7 3.6 4.0  CL 99 102 104 103 104  CO2 26 22 22 23  20*  GLUCOSE 108* 93 95 106* 98  BUN 36* 31* 20 22 19   CREATININE 2.18* 1.52* 1.14* 1.15* 0.98  CALCIUM 8.2* 8.1* 8.5* 8.7* 8.5*   GFR: Estimated Creatinine Clearance: 50 mL/min (by C-G formula based on SCr of 0.98 mg/dL). Liver Function Tests: Recent Labs  Lab 08/30/19 0206 09/01/19 0229 09/03/19 0154 09/04/19 0858 09/05/19 0202  AST 20 25 29  35 38  ALT 13 14 18 23 25   ALKPHOS 79 94 102 103 113  BILITOT 2.1* 1.2 0.7 0.7 1.0  PROT 6.2* 6.0* 6.5 6.4* 6.5  ALBUMIN 2.8* 2.7* 2.5* 2.7* 2.7*   Recent Labs  Lab 09/04/19 0858  LIPASE 2,732*   No results for input(s): AMMONIA in the last 168 hours. Coagulation Profile: Recent Labs  Lab 08/29/19 1805 08/31/19 1226  INR 1.1 1.1   Cardiac Enzymes: No results for input(s): CKTOTAL, CKMB, CKMBINDEX, TROPONINI in the last 168 hours. BNP (last 3 results) No results for  input(s): PROBNP in the last 8760 hours. HbA1C: No results for input(s): HGBA1C in the last 72 hours. CBG: No results for input(s): GLUCAP in the last 168 hours. Lipid Profile: No results for input(s): CHOL, HDL, LDLCALC, TRIG, CHOLHDL, LDLDIRECT in the last 72 hours. Thyroid Function Tests: No results for input(s): TSH, T4TOTAL, FREET4, T3FREE, THYROIDAB in the last 72 hours. Anemia Panel: No results for input(s): VITAMINB12, FOLATE, FERRITIN, TIBC, IRON, RETICCTPCT in the last 72 hours. Sepsis Labs: No results for input(s): PROCALCITON, LATICACIDVEN in the last 168 hours.  Recent Results (from the past 240 hour(s))  SARS CORONAVIRUS 2 (TAT 6-24 HRS) Nasopharyngeal Nasopharyngeal Swab     Status: None   Collection Time: 08/28/19 11:25 PM   Specimen: Nasopharyngeal Swab  Result Value Ref Range Status   SARS Coronavirus 2 NEGATIVE NEGATIVE Final    Comment: (NOTE) SARS-CoV-2 target nucleic acids are NOT DETECTED. The SARS-CoV-2 RNA is generally detectable in upper and lower respiratory specimens during the acute phase of infection. Negative results do not preclude SARS-CoV-2 infection, do not rule out co-infections with other pathogens, and should not be used as the sole basis for treatment or other patient management decisions. Negative results must be combined with clinical observations, patient history, and epidemiological information. The expected result is Negative. Fact Sheet for Patients: SugarRoll.be Fact Sheet for Healthcare Providers: https://www.woods-mathews.com/ This test is not yet approved or cleared by the [place] FDA and  has been authorized for detection and/or diagnosis of SARS-CoV-2 by FDA under an Emergency Use Authorization (EUA). This EUA will remain  in effect (meaning this test can be used) for the duration of the COVID-19 declaration under Section 56 4(b)(1) of the Act, 21 U.S.C. section 360bbb-3(b)(1),  unless the authorization is terminated or revoked sooner. Performed at [facility], [place] [address], [place], [place] [address]   MRSA PCR Screening     Status: None   Collection Time: 08/29/19  5:00 PM   Specimen: Nasal Mucosa; Nasopharyngeal  Result Value Ref Range Status   MRSA by PCR NEGATIVE NEGATIVE Final    Comment:        The GeneXpert MRSA Assay (FDA approved for NASAL specimens only), is one component of a comprehensive MRSA colonization surveillance program. It is not intended to diagnose MRSA infection nor to guide or monitor treatment for MRSA infections. Performed at [facility], [place] [address], [place], [place] [address]   Culture, blood (routine x 2)     Status: None   Collection Time: 08/30/19  8:10 AM   Specimen: BLOOD  Result Value Ref Range Status   Specimen Description   Final    BLOOD LEFT HAND Performed at [place] [address], [place], [place] [address]    Special Requests   Final    BOTTLES DRAWN AEROBIC AND ANAEROBIC Blood Culture adequate volume Performed at [place] [address] [place],  [address]    Culture   Final  NO GROWTH 5 DAYS Performed at [facility], [place] [address] [place], [place] [address]    Report Status 09/04/2019 FINAL  Final  Culture, blood (routine x 2)     Status: None   Collection Time: 08/30/19  8:21 AM   Specimen: BLOOD  Result Value Ref Range Status   Specimen Description   Final    BLOOD RIGHT HAND Performed at [place] [address], [place], [place] [address]    Special Requests   Final    BOTTLES DRAWN AEROBIC ONLY Blood Culture results may not be optimal due to an inadequate volume of blood received in culture bottles Performed at [place] [address], [place], [place] [address]    Culture   Final    NO GROWTH 5 DAYS Performed at [facility], [employer]  [address], [place], [place] [address]    Report Status 09/04/2019 FINAL  Final         Radiology Studies: No results found.      Scheduled Meds: . allopurinol  100 mg Oral Daily  . apixaban  5 mg Oral BID  . Chlorhexidine Gluconate Cloth  6 each Topical Daily  . diazepam  2.5 mg Oral QHS  . mouth rinse  15 mL Mouth Rinse BID  . metoprolol succinate  100 mg Oral Daily  . sodium chloride flush  5 mL Intracatheter Q8H   Continuous Infusions: . sodium chloride 150 mL/hr at 09/05/19 1100  . piperacillin-tazobactam (ZOSYN)  IV Stopped (09/05/19 1024)     LOS: 7 days    Time spent: 41 min    Nicolette Bang, MD [facility]  If 7PM-7AM, please contact night-coverage  09/05/2019, 12:39 PM

## 2019-09-06 ENCOUNTER — Inpatient Hospital Stay (HOSPITAL_COMMUNITY): Payer: Medicare Other

## 2019-09-06 DIAGNOSIS — J90 Pleural effusion, not elsewhere classified: Secondary | ICD-10-CM

## 2019-09-06 DIAGNOSIS — K668 Other specified disorders of peritoneum: Secondary | ICD-10-CM

## 2019-09-06 LAB — CBC
HCT: 29.8 % — ABNORMAL LOW (ref 36.0–46.0)
Hemoglobin: 9.7 g/dL — ABNORMAL LOW (ref 12.0–15.0)
MCH: 31.6 pg (ref 26.0–34.0)
MCHC: 32.6 g/dL (ref 30.0–36.0)
MCV: 97.1 fL (ref 80.0–100.0)
Platelets: 430 10*3/uL — ABNORMAL HIGH (ref 150–400)
RBC: 3.07 MIL/uL — ABNORMAL LOW (ref 3.87–5.11)
RDW: 13.9 % (ref 11.5–15.5)
WBC: 15.6 10*3/uL — ABNORMAL HIGH (ref 4.0–10.5)
nRBC: 0 % (ref 0.0–0.2)

## 2019-09-06 MED ORDER — FUROSEMIDE 10 MG/ML IJ SOLN
40.0000 mg | Freq: Two times a day (BID) | INTRAMUSCULAR | Status: DC
  Administered 2019-09-06 – 2019-09-07 (×3): 40 mg via INTRAVENOUS
  Filled 2019-09-06 (×3): qty 4

## 2019-09-06 NOTE — Progress Notes (Signed)
[facility] Gastroenterology Progress Note  Kristen [name] 78 y.o. 1941/09/17  09/05/2019  CC: Bile leak   Subjective: Abdominal pain is improving.  Tolerating clear liquid diet.  Complaining of nausea but denies any vomiting.  Complaining of mild shortness of breath.  Complaining of mild acid reflux   ROS :   Negative for chest pain.    Objective: Vital signs in last 24 hours: Vitals:   09/05/19 1956 09/06/19 0607  BP: 128/61 134/75  Pulse: 76 81  Resp: 20 20  Temp: 98.8 F (37.1 C) 98.3 F (36.8 C)  SpO2: 90% 95%    Physical Exam:  General:  Alert, cooperative, no distress, appears stated age  Head:  Normocephalic, without obvious abnormality, atraumatic  Eyes:  , EOM's intact,   Lungs:    Fine basilar crackles, anterior exam only, no significant respiratory distress  Heart:   Irregularly irregular rate and rhythm,  Abdomen:    JP drain right upper quadrant with very minimal output, epigastric tenderness to palpation, bowel sounds present.  No peritoneal signs  Extremities: Extremities normal, atraumatic, no  edema       Lab Results: Recent Labs    09/04/19 0858 09/05/19 0202  NA 136 133*  K 3.6 4.0  CL 103 104  CO2 23 20*  GLUCOSE 106* 98  BUN 22 19  CREATININE 1.15* 0.98  CALCIUM 8.7* 8.5*   Recent Labs    09/04/19 0858 09/05/19 0202  AST 35 38  ALT 23 25  ALKPHOS 103 113  BILITOT 0.7 1.0  PROT 6.4* 6.5  ALBUMIN 2.7* 2.7*   Recent Labs    09/05/19 0202 09/06/19 0515  WBC 12.6* 15.6*  HGB 10.6* 9.7*  HCT 33.1* 29.8*  MCV 97.1 97.1  PLT 422* 430*   No results for input(s): LABPROT, INR in the last 72 hours.    Assessment/Plan: -Bile leak s/p laparoscopic cholecystectomy.  Status post ERCP with sphincterotomy, removal of CBD stone and CBD stent placement yesterday. -Epigastric abdominal pain.  Could be post ERCP pancreatitis. -History of A. fib.  Eliquis on hold -History of CHF.  Complaining of mild shortness of  breath.  Recommendations ---------------------- -Abdominal pain improving.  Complaining of mild shortness of breath.  Decrease IV hydration 200 cc/h.  Advance diet to full liquid. -Start PPI for reflux. -GI will follow    Otis Brace MD, [place] 09/06/2019, 10:48 AM  Contact #  [phone]

## 2019-09-06 NOTE — TOC Initial Note (Signed)
Transition of Care [facility]) - Initial/Assessment Note    Patient Details  Name: Kristen [name] MRN: [id number] Date of Birth: 04-01-1941  Transition of Care [facility]) CM/SW Contact:    Trish Mage, LCSW Phone Number: 09/06/2019, 5:05 PM  Clinical Narrative:     Ms Kouns was seen per consult to CSW for financial stressors. She proceeded to give me 20 minutes of background before we could get to the immediate needs at hand.  States she is in need of rent and utility assistance.  I had her sign a release for [employer] and referred her to agencies for help.  I also gave her the name and number for [facility].  It was telling when shew told me she has had some dealings with them. "I don't think they like me very much over there."  She also gave me her signed and notarized HCPOA, which I put in the shadow chart to be entered into Epic upon d/c.  TOC will continue to follow during the course of hospitalization.               Expected Discharge Plan: [place]     Patient Goals and CMS Choice Patient states their goals for this hospitalization and ongoing recovery are:: "Can you help me with my rent?"      Expected Discharge Plan and Services Expected Discharge Plan: [place] In-house Referral: Clinical Social Work     Living arrangements for the past 2 months: Mobile Home                                      Prior Living Arrangements/Services Living arrangements for the past 2 months: Mobile Home Lives with:: Self Patient language and need for interpreter reviewed:: Yes Do you feel safe going back to the place where you live?: (She is unsure due to medical condition at this point)      Need for Family Participation in Patient Care: No (Comment) Care giver support system in place?: No (comment)   Criminal Activity/Legal Involvement Pertinent to Current Situation/Hospitalization: No - Comment as needed  Activities  of Daily Living Home Assistive Devices/Equipment: Bedside commode/3-in-1, Walker (specify type), Cane (specify quad or straight), Eyeglasses, Dentures (specify type)(upper/lower dentures, 4 wheeled walker, single point cane) ADL Screening (condition at time of admission) Patient's cognitive ability adequate to safely complete daily activities?: Yes Is the patient deaf or have difficulty hearing?: No Does the patient have difficulty seeing, even when wearing glasses/contacts?: No Does the patient have difficulty concentrating, remembering, or making decisions?: No Patient able to express need for assistance with ADLs?: Yes Does the patient have difficulty dressing or bathing?: Yes Independently performs ADLs?: No Communication: Independent Dressing (OT): Needs assistance Is this a change from baseline?: Change from baseline, expected to last >3 days Grooming: Needs assistance Is this a change from baseline?: Change from baseline, expected to last >3 days Feeding: Needs assistance Is this a change from baseline?: Change from baseline, expected to last >3 days Bathing: Needs assistance Is this a change from baseline?: Change from baseline, expected to last >3 days Toileting: Needs assistance Is this a change from baseline?: Change from baseline, expected to last >3days In/Out Bed: Needs assistance Is this a change from baseline?: Change from baseline, expected to last >3 days Walks in Home: Needs assistance Is this a change  from baseline?: Change from baseline, expected to last >3 days Does the patient have difficulty walking or climbing stairs?: Yes Weakness of Legs: Both Weakness of Arms/Hands: None  Permission Sought/Granted Permission sought to share information with : Other (comment)([employer]) Permission granted to share information with : Yes, Release of Information Signed              Emotional Assessment Appearance:: Appears stated age   Affect (typically observed):  Pleasant Orientation: : Oriented to Self, Oriented to Place, Oriented to  Time, Oriented to Situation Alcohol / Substance Use: Not Applicable Psych Involvement: No (comment)  Admission diagnosis:  Anasarca [R60.1] Pleural effusion [J90] Right upper quadrant abdominal pain [R10.11] Acute renal failure, unspecified acute renal failure type ([place]) [N17.9] A-fib (HCC) [I48.91] Patient Active Problem List   Diagnosis Date Noted  . Acute renal failure (ARF) ([place]) 08/29/2019  . Intraabdominal fluid collection 08/29/2019  . A-fib ([place]) 08/29/2019  . Varicose veins of leg with edema, right 05/21/2019  . Ankle swelling, right 05/21/2019  . Bunion of great toe of right foot 05/21/2019  . Osteoarthritis 03/13/2018  . Gait abnormality 03/13/2018  . Skin lesion 02/13/2018  . 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 Abnormal Pap smear of cervix 02/08/2016  . Chronic kidney disease 02/08/2016  . Urinary frequency 02/08/2016  . History of diverticulitis 01/22/2016  . Anxiety state 10/17/2014  . Long-term (current) use of anticoagulants 06/10/2014  . Chronic venous insufficiency 06/10/2014  . Obesity (BMI 30-39.9)   . Acute on chronic diastolic CHF (congestive heart failure) ([place])   . [place]idal intertrigo 06/09/2014  . Nocturnal leg cramps 04/13/2012  . Insomnia 01/10/2011  . Permanent atrial fibrillation ([place])   . OSTEOPENIA 01/31/2008  . Hypertensive heart disease    PCP:  [name], MD Pharmacy:   [id number] - [place], [address] [place] [address] Phone: [phone] Fax: [phone]     Social Determinants of Health (SDOH) Interventions    Readmission Risk Interventions No flowsheet data found.

## 2019-09-06 NOTE — Progress Notes (Signed)
S: short of breath, tolerating liquids, +BM yesterday O: BP (!) 150/66 (BP Location: Right Arm)   Pulse 90   Temp 97.7 F (36.5 C) (Oral)   Resp 20   Ht 5\' 2"  (1.575 m)   Wt 95 kg   SpO2 96%   BMI 38.31 kg/m  Gen: NAD Neuro: AOx4 Abd: soft, NT, ND, drain with dark yellow fluid  A/P 78 yo female s/p lap chole with stump leak, s/p IR drain and ERCP with stent. Today short of breath -advance diet -minimize fluids

## 2019-09-06 NOTE — Progress Notes (Signed)
0400 pt c/o SOB, lung sounds diminished without wheezing, no crackles. Pt had audible wheeze when laying flat & on her side during personal hygiene, but that cleared when sitting upright. Pt is becoming anxious about her breathing so O2 @2  lnc applied.

## 2019-09-06 NOTE — Progress Notes (Signed)
[facility] Progress Note  Kristen [name] 78 y.o. 06-30-1941  CC: Bile leak   Subjective: Epigastric abdominal pain resolved.  Constipation also resolved.  Complaining of nausea but denied any vomiting.  Complaining of shortness of breath.   ROS :   Negative for chest pain.  Negative for fever.   Objective: Vital signs in last 24 hours: Vitals:   09/05/19 1956 09/06/19 0607  BP: 128/61 134/75  Pulse: 76 81  Resp: 20 20  Temp: 98.8 F (37.1 C) 98.3 F (36.8 C)  SpO2: 90% 95%    Physical Exam:  General:  Alert, cooperative, no distress, appears stated age  Head:  Normocephalic, without obvious abnormality, atraumatic  Eyes:  , EOM's intact,   Lungs:    Fine basilar crackles, no significant respiratory distress  Heart:   Irregularly irregular rate and rhythm,  Abdomen:    Soft, nontender, nondistended, bowel sounds present.  No peritoneal signs  Extremities: Extremities normal, atraumatic, no  edema       Lab Results: Recent Labs    09/04/19 0858 09/05/19 0202  NA 136 133*  K 3.6 4.0  CL 103 104  CO2 23 20*  GLUCOSE 106* 98  BUN 22 19  CREATININE 1.15* 0.98  CALCIUM 8.7* 8.5*   Recent Labs    09/04/19 0858 09/05/19 0202  AST 35 38  ALT 23 25  ALKPHOS 103 113  BILITOT 0.7 1.0  PROT 6.4* 6.5  ALBUMIN 2.7* 2.7*   Recent Labs    09/05/19 0202 09/06/19 0515  WBC 12.6* 15.6*  HGB 10.6* 9.7*  HCT 33.1* 29.8*  MCV 97.1 97.1  PLT 422* 430*   No results for input(s): LABPROT, INR in the last 72 hours.    Assessment/Plan: -Bile leak s/p laparoscopic cholecystectomy.  Status post ERCP with sphincterotomy, removal of CBD stone and CBD stent placement 09/03/2019  -Epigastric abdominal pain.  Could be post ERCP pancreatitis. -History of A. fib.  Eliquis on hold -History of CHF.  Complaining of mild shortness of breath.  Recommendations ---------------------- -Epigastric abdominal pain is resolved.  Mild shortness of breath could be from  pleural effusion from IV hydration.  Leukocytosis could be reactive from pancreatitis.  Patient is afebrile.  -Change IV hydration to 25 cc/h. -Advance diet to soft -She will need a repeat EGD in 4 to 6 weeks as an outpatient for removal of CBD stent  -GI will follow  Otis Brace MD, FACP 09/06/2019, 9:09 AM  Contact #  [phone]

## 2019-09-06 NOTE — Progress Notes (Signed)
Referring Physician(s): Outlaw,W  Supervising Physician: Aletta Edouard  Patient Status:  [name] - In-pt  Chief Complaint:  Dyspnea, abdominal pain  Subjective: Pt c/o more dyspnea today -states started yesterday; minimal RUQ discomfort; denies CP,N/V; has occ cough, no hemoptysis   Allergies: Flecainide acetate  Medications: Prior to Admission medications   Medication Sig Start Date End Date Taking? Authorizing Provider  alendronate (FOSAMAX) 10 MG tablet TAKE (1) TABLET BY MOUTH PER WEEK TAKE ON EMPTY STOMACH WITH FULL GLASS OF WATER Patient taking differently: Take 10 mg by mouth daily before breakfast. TAKE (1) TABLET BY MOUTH PER WEEK TAKE ON EMPTY STOMACH WITH FULL GLASS OF WATER 05/02/19  Yes [name], MD  allopurinol (ZYLOPRIM) 100 MG tablet TAKE 1 TABLET BY MOUTH EVERY DAY Patient taking differently: Take 100 mg by mouth daily.  06/24/19  Yes [name], MD  calcium carbonate (OSCAL) 1500 (600 Ca) MG TABS tablet Take 1,500 mg by mouth daily.    Yes [provider]  calcium carbonate (TUMS - DOSED IN MG ELEMENTAL CALCIUM) 500 MG chewable tablet Chew 1 tablet by mouth as needed for indigestion or heartburn.   Yes [provider]  cholecalciferol (VITAMIN D3) 25 MCG (1000 UT) tablet Take 1,000 Units by mouth daily.   Yes [provider]  CINNAMON PO Take 1 tablet by mouth daily.    Yes [provider]  clindamycin (CLINDAGEL) 1 % gel APPLY TO AFFECTED AREA TWICE A DAY Patient taking differently: Apply 1 application topically 2 (two) times daily.  04/13/18  Yes [name], MD  colchicine 0.6 MG tablet Take 1 tablet (0.6 mg total) by mouth 2 (two) times daily. Take twice daily until pain resolves and then take as needed for gout flare. Patient taking differently: Take 0.6 mg by mouth 2 (two) times daily as needed. Take twice daily until pain resolves and then take as needed for gout flare. 06/15/18  Yes [name], MD  [facility]  ANTI-FUNGAL 2 % powder APPLY TO AFFECTED AREA TWICE A DAY AFTER SKIN RESOLVES FROM KETOCONAZOLE CREAM Patient taking differently: as needed.  10/17/14  Yes [name], MD  diazepam (VALIUM) 5 MG tablet TAKE 1/2 TABLET (2.5 MG TOTAL) BY MOUTH AT BEDTIME. Patient taking differently: Take 2.5 mg by mouth at bedtime.  07/08/19  Yes [name], MD  doxycycline (VIBRAMYCIN) 50 MG capsule TAKE 2 CAPSULES BY MOUTH 2 (TWO) TIMES DAILY. Patient taking differently: Take 100 mg by mouth See admin instructions. Twice daily As directed for deer tick's (allergic reaction) 04/25/18  Yes [name], MD  ELIQUIS 5 MG TABS tablet TAKE 1 TABLET BY MOUTH TWICE A DAY Patient taking differently: Take 5 mg by mouth 2 (two) times daily.  05/23/19  Yes Park Liter, MD  Flax Oil-Fish Oil-Borage Oil (FISH-FLAX-BORAGE) CAPS Take 1 capsule by mouth daily.   Yes [provider]  furosemide (LASIX) 20 MG tablet Take 1 tablet (20 mg total) by mouth daily. Patient taking differently: Take 20 mg by mouth daily.  08/19/19  Yes Park Liter, MD  glucosamine-chondroitin (GLUCOSAMINE-CHONDROITIN DS) 500-400 MG tablet Take 1 tablet by mouth daily.    Yes [provider]  HYDROcodone-acetaminophen (NORCO/VICODIN) 5-325 MG tablet Take 1 tablet by mouth every 6 (six) hours as needed for moderate pain. 08/22/19  Yes [name], [name], MD  ibuprofen (ADVIL) 800 MG tablet Take 1 tablet (800 mg total) by mouth every 8 (eight) hours as needed. 08/22/19  Yes [name], [name], MD  lisinopril-hydrochlorothiazide (ZESTORETIC) 20-25 MG tablet Take 1 tablet by mouth daily. Patient taking differently: Take 1 tablet by mouth daily.  03/08/19  Yes [name], MD  loratadine (CLARITIN) 10 MG tablet Take 1 tablet (10 mg total) by mouth daily. 07/08/19  Yes [name], MD  metoprolol succinate (TOPROL-XL) 100 MG 24 hr tablet Take 1 tablet (100 mg total) by mouth daily. Take with or immediately  following a meal. Patient taking differently: Take 100 mg by mouth daily. Take with or immediately following a meal. 01/29/19 08/28/19 Yes Park Liter, MD  Multiple Vitamins-Minerals (EMERGEN-C IMMUNE) PACK Take 1 tablet by mouth daily.    Yes [provider]  Multiple Vitamins-Minerals (MULTIVITAMIN WOMEN 50+) TABS Take 1 tablet by mouth daily.    Yes [provider]  mupirocin ointment (BACTROBAN) 2 % APPLY TOPICALLY TO AFFECTED AREA TWICE DAILY Patient taking differently: as needed.  02/20/19  Yes [name], MD  Nutritional Supplements (ECHINACEA/GOLDEN SEAL PO) Take 1 tablet by mouth daily.    Yes [provider]  polycarbophil (FIBERCON) 625 MG tablet Take 625 mg by mouth daily.   Yes [provider]  potassium chloride (KLOR-CON) 20 MEQ packet MIX 1 PACKET IN LIQUID AND TAKE BY MOUTH DAILY AS DIRECTED Patient taking differently: Take 20 mEq by mouth daily. MIX 1 PACKET IN LIQUID AND TAKE BY MOUTH DAILY AS DIRECTED 02/20/19  Yes [name], MD  Probiotic Product (PROBIOTIC-10 PO) Take 1 tablet by mouth daily.    Yes [provider]  TURMERIC PO Take 1 tablet by mouth daily.    Yes [provider]     Vital Signs: BP (!) 150/66 (BP Location: Right Arm)   Pulse 90   Temp 97.7 F (36.5 C) (Oral)   Resp 20   Ht 5\' 2"  (1.575 m)   Wt 209 lb 7 oz (95 kg)   SpO2 96%   BMI 38.31 kg/m   Physical Exam  Awake/alert; RUQ drain intact, insertion site , mildly tender, output about 20 cc bilious fluid; drain flushes ok  Imaging: Dg Ercp  Result Date: 09/03/2019 CLINICAL DATA:  78 year old female with a history of sphincterotomy for choledocholithiasis EXAM: ERCP TECHNIQUE: Multiple spot images obtained with the fluoroscopic device and submitted for interpretation post-procedure. FLUOROSCOPY TIME:  Fluoroscopy Time: 4 minutes 29 seconds COMPARISON:  None. FINDINGS: Limited images during ERCP. Initial image demonstrates  endoscope projecting over the upper abdomen with a safety wire in position in the extrahepatic biliary ducts and partial opacification with contrast. Subsequently there is evidence retrieval balloon deployment and placement of a plastic biliary stent. Ill-defined contrast between the common bile duct and the pigtail drainage catheter present. Pigtail drainage catheter in place in the upper abdomen. IMPRESSION: Limited images during ERCP demonstrates treatment of choledocholithiasis with deployment of a retrieval balloon and placement of a plastic biliary stent. Ill-defined contrast between the common bile duct and the pigtail drainage catheter may reflect leakage or refluxed contrast in a cystic duct stump. Please refer to the dictated operative report for full details of intraoperative findings and procedure. Electronically Signed   By: Corrie Mckusick D.O.   On: 09/03/2019 13:11    Labs:  CBC: Recent Labs    09/03/19 0154 09/04/19 0201 09/05/19 0202 09/06/19 0515  WBC 10.0 10.7* 12.6* 15.6*  HGB 10.7* 11.1* 10.6* 9.7*  HCT 33.1* 34.5* 33.1* 29.8*  PLT 402* 405* 422* 430*    COAGS: Recent Labs  08/29/19 1805 08/31/19 1226  INR 1.1 1.1  APTT  --  32    BMP: Recent Labs    09/01/19 0229 09/03/19 0154 09/04/19 0858 09/05/19 0202  NA 135 137 136 133*  K 3.6 3.7 3.6 4.0  CL 102 104 103 104  CO2 22 22 23  20*  GLUCOSE 93 95 106* 98  BUN 31* 20 22 19   CALCIUM 8.1* 8.5* 8.7* 8.5*  CREATININE 1.52* 1.14* 1.15* 0.98  GFRNONAA 32* 46* 45* 55*  GFRAA 37* 53* 52* >60    LIVER FUNCTION TESTS: Recent Labs    09/01/19 0229 09/03/19 0154 09/04/19 0858 09/05/19 0202  BILITOT 1.2 0.7 0.7 1.0  AST 25 29 35 38  ALT 14 18 23 25   ALKPHOS 94 102 103 113  PROT 6.0* 6.5 6.4* 6.5  ALBUMIN 2.7* 2.5* 2.7* 2.7*    Assessment and Plan: Pt s/p lap chole 10/15; now with GB fossa fluid collection/biloma, s/p drain placement 10/23; s/p ERCP with removal of CBD stone/ CBD stent 10/27; c/o  dyspnea today- check CXR; afebrile; WBC 15.6(12.6), hgb 9.7(10.6), check bile cx; cont drain flushes; once drain output minimal obtain f/u CT   Electronically Signed: D. Rowe Robert, PA-C 09/06/2019, 3:39 PM   I spent a total of 15 minutes at the the patient's bedside AND on the patient's hospital floor or unit, greater than 50% of which was counseling/coordinating care for gallbladder fossa fluid collection drain    Patient ID: Kristen [name], female   DOB: [birth year], 78 y.o.   MRN: [id number]

## 2019-09-06 NOTE — Progress Notes (Signed)
PROGRESS NOTE    Kristen [name]  UMP:[id number] DOB: 01-12-1941 DOA: 08/28/2019 PCP: [name], MD   Brief Narrative:  Patient is a 78 female with history of anxiety, A. fib on Eliquis, hypertension, CKD with uncertain baseline, who presents to the emergency room with abdominal pain. Patient underwent laparoscopic cholecystectomy on 08/22/2019 and was recovering uneventfully back to home but started developing severe abdomen pain on right upper quadrant with radiation towards the right shoulder blade so she presented to the emergency department. In the emergency department she was found to be hypoxic, tachycardic. Chest x-ray was concerning for interstitial edema. BNP was elevated her blood work showed creatinine of 3.79 up from 1.3 ,a week earlier. CT abdomen/pelvis revealed fluid collection in the gallbladder fossa. General surgery consulted. HIDA scan done and itshowed biliary leak and no activity in the small bowel. While in the emergency department, she alsodeveloped A. fib with RVR and had to be started on Cardizem drip.  Underwent biliary drainby IR on 08/30/19. ERCP by GI 10/27 with stent placement   Assessment & Plan:   Principal Problem:   Acute renal failure (ARF) ([place]) Active Problems:   Hypertensive heart disease   Permanent atrial fibrillation (HCC)   Acute on chronic diastolic CHF (congestive heart failure) (HCC)   Situational mixed anxiety and depressive disorder   Intraabdominal fluid collection   A-fib (HCC)   Abdominal pain/biliary leak:HIDA scan was suggestive of biliary leak. presented with severe right upper quadrant pain. History of cholecystectomy on October 15. She had severe tenderness in the right upper quadrant. CT scan showed fluid collection in the gallbladder fossa.  -Underwent biliary drain by IR with yield ofsignificant amount of biliary fluid.  -GI, general surgery following.   -ERCP Tuesday with choledocholithiasis noted  with removal of stone, appreciate GI input, -patient with severe abdominal pain postop although significantly improved 10/29, restarted Eliquis 10/28,  -continue with clears today, consider advancing tomorrow,  -Continue on intravenous Zosyn for now since WBC count is trending up  Acute respiratory failure with hypoxia.  Increasingly short of breath today.  Having difficulty completing a sentence without becoming short of breath.  Possibly related to volume overload from IV hydration.  IV fluids have been decreased.  Started on IV Lasix.  Chest x-ray was ordered.  Acute kidney injury:She was also on ibuprofen and lisinopril at home. AKI improved. Monitor BMP.   Acute on chronic diastolicCHF/acute hypoxic respiratory failure:Appears more short of breath today.  Repeat chest x-ray has been ordered today.  Likely has evidence of volume overload.  Started on intravenous Lasix. 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 Went into A. fib in the emergency department. She was startedCardizem drip with improvement ,now stopped. On eliquis for anticoagulation which has been restarted 10/28,continue metoprolol for rate control   Hypertension: Currently blood pressure stable. Continue current medicines.  Hypokalemia: Supplemented with potassium.  DVT prophylaxis: TO:IZTIWPY  Code Status: Full    Code Status Orders  (From admission, onward)         Start     Ordered   08/29/19 0425  Full code  Continuous     08/29/19 0424        Code Status History    This patient has a current code status but no historical code status.   Advance Care Planning Activity    Advance Directive Documentation     Most  Recent Value  Type of Advance Directive  Healthcare Power of Attorney, Living will  Pre-existing out of facility DNR order (yellow form or pink MOST form)  -  "MOST" Form in  Place?  -     Family Communication: Discussed with patient in detail Disposition Plan:   Patient remained inpatient for continued IV antibiotics, slow advancing of diet, patient not able to tolerate p.o. intake, still with abdominal pain requiring IV IV pain meds.  She is also short of breath requiring oxygen.  Patient not medically ready for discharge Consults called: None Admission status: Inpatient   Consultants:   GI, general surgery  Procedures:  Ct Abdomen Pelvis Wo Contrast  Result Date: 08/29/2019 CLINICAL DATA:  Acute generalized abdominal pain. Status post cholecystectomy 08/22/2019. Patient not a pain medication. EXAM: CT ABDOMEN AND PELVIS WITHOUT CONTRAST TECHNIQUE: Multidetector CT imaging of the abdomen and pelvis was performed following the standard protocol without IV contrast. COMPARISON:  CT 08/10/2010, no interval CT FINDINGS: Lower chest: Small right and trace left pleural effusion. Bilateral lower lobe atelectasis. Compressive atelectasis in the right middle lobe related to elevated hemidiaphragm. Cardiomegaly. Hepatobiliary: Post cholecystectomy with fluid collection in the gallbladder fossa measuring 6.5 x 2.5 cm. Fluid measures simple density. Tiny dependent hyperdensities in the fluid collection, images 27 and 28 series 2 are nonspecific may represent drop stones. Small volume of simple perihepatic free fluid. Common bile duct is poorly defined, however measures approximately 10 mm. No visualized choledocholithiasis. Suggestion of slight nodular hepatic contours. No evidence of focal hepatic lesion. Pancreas: No ductal dilatation or inflammation. Spleen: Normal in size without focal abnormality. Adrenals/Urinary Tract: Normal adrenal glands. Extrarenal pelvis configuration of the right greater than left kidney. No renal stones. No perinephric edema. Urinary bladder is physiologically distended. No wall thickening. No bladder stone. Stomach/Bowel: Ingested contrast in the  stomach. No gastric wall thickening. Administered enteric contrast reaches mid distal small bowel. No small bowel obstruction. Normal appendix. Moderate stool in the proximal colon. Small volume of stool in the more distal colon. Mild distal descending diverticulosis, prominent diverticulosis in the sigmoid. No evidence of diverticulitis. Vascular/Lymphatic: Mild aortic atherosclerosis. No aneurysm. Definite enlarged lymph nodes in the abdomen or pelvis. Reproductive: Atrophic uterus. No gross adnexal mass. Other: Small volume simple free fluid in the pelvis. Minimal fluid in the right pericolic gutter. No free air. Expected stranding in the subcutaneous tissues port sites. No subcutaneous fluid collection. Musculoskeletal: There are no acute or suspicious osseous abnormalities. Scoliosis and degenerative change in the spine. IMPRESSION: 1. Post recent cholecystectomy with fluid collection in the gallbladder fossa measuring 6.5 x 2.5 cm, may be postoperative seroma or biloma. Tiny dependent hyperdensities within the fluid collection are nonspecific, may represent drop stones or potentially surgical packing. Consider nuclear medicine hepatic biliary scan if there is concern for biloma. 2. Prominent common bile duct at 10 mm, may be normal post cholecystectomy. 3. Additional free fluid about the liver and tracking into the pelvis measures simple fluid density. 4. Small right pleural effusion.  Bibasilar atelectasis. 5. Colonic diverticulosis without diverticulitis. Aortic Atherosclerosis (ICD10-I70.0). Electronically Signed   By: Keith Rake M.D.   On: 08/29/2019 02:36   Dg Chest Port 1 View  Result Date: 08/28/2019 CLINICAL DATA:  CHF, shortness of breath EXAM: PORTABLE CHEST 1 VIEW COMPARISON:  None. FINDINGS: There is mild cardiomegaly. Pulmonary vascular congestion is seen. There is mildly increased interstitial markings throughout both lungs. There is blunting of the left costophrenic angle which could  be due to a trace left pleural effusion. No acute osseous abnormality. IMPRESSION: Mild cardiomegaly and interstitial edema. Probable trace left pleural effusion. Electronically Signed   By: Prudencio Pair M.D.   On: 08/28/2019 22:34   Dg Ercp  Result Date: 09/03/2019 CLINICAL DATA:  78 year old female with a history of sphincterotomy for choledocholithiasis EXAM: ERCP TECHNIQUE: Multiple spot images obtained with the fluoroscopic device and submitted for interpretation post-procedure. FLUOROSCOPY TIME:  Fluoroscopy Time: 4 minutes 29 seconds COMPARISON:  None. FINDINGS: Limited images during ERCP. Initial image demonstrates endoscope projecting over the upper abdomen with a safety wire in position in the extrahepatic biliary ducts and partial opacification with contrast. Subsequently there is evidence retrieval balloon deployment and placement of a plastic biliary stent. Ill-defined contrast between the common bile duct and the pigtail drainage catheter present. Pigtail drainage catheter in place in the upper abdomen. IMPRESSION: Limited images during ERCP demonstrates treatment of choledocholithiasis with deployment of a retrieval balloon and placement of a plastic biliary stent. Ill-defined contrast between the common bile duct and the pigtail drainage catheter may reflect leakage or refluxed contrast in a cystic duct stump. Please refer to the dictated operative report for full details of intraoperative findings and procedure. Electronically Signed   By: Corrie Mckusick D.O.   On: 09/03/2019 13:11   Ct Image Guided Drainage By Percutaneous Catheter  Result Date: 08/30/2019 INDICATION: 78 year old female with post cholecystectomy bile leak. She presents for CT-guided drain placement. EXAM: CT-guided drain placement MEDICATIONS: The patient is currently admitted to the hospital and receiving intravenous antibiotics. The antibiotics were administered within an appropriate time frame prior to the initiation of  the procedure. ANESTHESIA/SEDATION: Fentanyl 100 mcg IV; Versed 4 mg IV, 1 mg Dilaudid Moderate Sedation Time:  34 minutes The patient was continuously monitored during the procedure by the interventional radiology nurse under my direct supervision. COMPLICATIONS: None immediate. PROCEDURE: Informed written consent was obtained from the patient after a thorough discussion of the procedural risks, benefits and alternatives. All questions were addressed. A timeout was performed prior to the initiation of the procedure. A planning axial CT scan was performed. The fluid collection in the gallbladder fossa was successfully identified. A suitable skin entry site was selected and marked. Local anesthesia was attained by infiltration with 1% lidocaine. A small dermatotomy was made. Under intermittent CT guidance, an 18 gauge trocar needle was advanced into the fluid collection. A 0.035 wire was then advanced into the fluid collection. The needle was removed. The soft tissue tract was dilated to 10 [place] and a [place] all-purpose drainage catheter was advanced over the wire and formed in the fluid collection. Aspiration yields several 100 mL of bilious fluid. The catheter was secured in place with 0 Prolene suture and connected to JP bulb suction. Follow-up CT imaging demonstrates a well-positioned drainage catheter with significantly decreased fluid in the gallbladder fossa. However, some of the fluid has leaked into the subcutaneous soft tissues along the catheter tubing. IMPRESSION: Successful placement of 10 French drainage catheter into the gallbladder fossa with aspiration of several 100 mL bilious fluid. Electronically Signed   By: Jacqulynn Cadet M.D.   On: 08/30/2019 13:47   Nm Hepato Biliary Leak  Addendum Date: 08/29/2019   ADDENDUM REPORT: 08/29/2019 11:46 ADDENDUM: These results were called by telephone at the time of interpretation on 08/29/2019 at 11:46 am to provider Dr. Marlou Starks, who verbally  acknowledged these results. Electronically Signed   By: Jewel Baize.D.  On: 08/29/2019 11:46   Result Date: 08/29/2019 CLINICAL DATA:  History of suspected bile leak. EXAM: NUCLEAR MEDICINE HEPATOBILIARY IMAGING TECHNIQUE: Sequential images of the abdomen were obtained out to 60 minutes following intravenous administration of radiopharmaceutical. RADIOPHARMACEUTICALS:  5.1 mCi Tc-6m  Choletec IV COMPARISON:  CT study of 08/29/2019 FINDINGS: Prompt uptake of radiotracer into hepatic parenchyma with excretion into the biliary tree is noted. An amorphous collection of radiotracer is demonstrated, enlarging over time, over the right upper quadrant extending into the expected area of gallbladder fossa and porta hepatis. Likely tracking up over the liver as well. IMPRESSION: Nuclear medicine signs of biliary leak. No signs of small bowel activity. A call is out to the referring provider to discuss findings in the above case. Electronically Signed: By: Zetta Bills M.D. On: 08/29/2019 11:39    Antimicrobials:   Zosyn >10/24   Subjective: Feels more short of breath today.  Shortness of breath began yesterday.  No chest pain.  No cough.  Had oxygen applied overnight.  Objective: Vitals:   09/05/19 1956 09/06/19 0607 09/06/19 1415 09/06/19 2021  BP: 128/61 134/75 (!) 150/66 (!) 151/59  Pulse: 76 81 90 64  Resp: 20 20 20 17   Temp: 98.8 F (37.1 C) 98.3 F (36.8 C) 97.7 F (36.5 C) 99 F (37.2 C)  TempSrc: Oral Oral Oral Oral  SpO2: 90% 95% 96% 97%  Weight:      Height:        Intake/Output Summary (Last 24 hours) at 09/06/2019 2105 Last data filed at 09/06/2019 1724 Gross per 24 hour  Intake 3031.56 ml  Output 2220 ml  Net 811.56 ml   Filed Weights   09/02/19 0500 09/03/19 0351 09/05/19 0500  Weight: 97.1 kg 95.6 kg 95 kg    Examination:  General exam: Alert, awake, oriented x 3 Respiratory system: Diminished breath sounds at bases.  Increased respiratory  effort. Cardiovascular system:RRR. No murmurs, rubs, gallops. Gastrointestinal system: Abdomen is nondistended, soft and nontender. No organomegaly or masses felt. Normal bowel sounds heard. Central nervous system: Alert and oriented. No focal neurological deficits. Extremities: Trace pedal edema bilaterally Skin: No rashes, lesions or ulcers Psychiatry: Judgement and insight appear normal. Mood & affect appropriate.    Data Reviewed: I have personally reviewed following labs and imaging studies  CBC: Recent Labs  Lab 08/31/19 0204 09/01/19 0229 09/02/19 0419 09/03/19 0154 09/04/19 0201 09/05/19 0202 09/06/19 0515  WBC 11.1* 10.9* 9.3 10.0 10.7* 12.6* 15.6*  NEUTROABS 6.8 5.5  --   --   --   --   --   HGB 10.1* 10.1* 10.0* 10.7* 11.1* 10.6* 9.7*  HCT 31.8* 31.1* 31.0* 33.1* 34.5* 33.1* 29.8*  MCV 98.1 97.2 96.9 96.2 97.5 97.1 97.1  PLT 270 281 337 402* 405* 422* 347*   Basic Metabolic Panel: Recent Labs  Lab 08/31/19 0204 09/01/19 0229 09/03/19 0154 09/04/19 0858 09/05/19 0202  NA 135 135 137 136 133*  K 3.3* 3.6 3.7 3.6 4.0  CL 99 102 104 103 104  CO2 26 22 22 23  20*  GLUCOSE 108* 93 95 106* 98  BUN 36* 31* 20 22 19   CREATININE 2.18* 1.52* 1.14* 1.15* 0.98  CALCIUM 8.2* 8.1* 8.5* 8.7* 8.5*   GFR: Estimated Creatinine Clearance: 50 mL/min (by C-G formula based on SCr of 0.98 mg/dL). Liver Function Tests: Recent Labs  Lab 09/01/19 0229 09/03/19 0154 09/04/19 0858 09/05/19 0202  AST 25 29 35 38  ALT 14 18 23 25   ALKPHOS  94 102 103 113  BILITOT 1.2 0.7 0.7 1.0  PROT 6.0* 6.5 6.4* 6.5  ALBUMIN 2.7* 2.5* 2.7* 2.7*   Recent Labs  Lab 09/04/19 0858  LIPASE 2,732*   No results for input(s): AMMONIA in the last 168 hours. Coagulation Profile: Recent Labs  Lab 08/31/19 1226  INR 1.1   Cardiac Enzymes: No results for input(s): CKTOTAL, CKMB, CKMBINDEX, TROPONINI in the last 168 hours. BNP (last 3 results) No results for input(s): PROBNP in the last  8760 hours. HbA1C: No results for input(s): HGBA1C in the last 72 hours. CBG: No results for input(s): GLUCAP in the last 168 hours. Lipid Profile: No results for input(s): CHOL, HDL, LDLCALC, TRIG, CHOLHDL, LDLDIRECT in the last 72 hours. Thyroid Function Tests: No results for input(s): TSH, T4TOTAL, FREET4, T3FREE, THYROIDAB in the last 72 hours. Anemia Panel: No results for input(s): VITAMINB12, FOLATE, FERRITIN, TIBC, IRON, RETICCTPCT in the last 72 hours. Sepsis Labs: No results for input(s): PROCALCITON, LATICACIDVEN in the last 168 hours.  Recent Results (from the past 240 hour(s))  SARS CORONAVIRUS 2 (TAT 6-24 HRS) Nasopharyngeal Nasopharyngeal Swab     Status: None   Collection Time: 08/28/19 11:25 PM   Specimen: Nasopharyngeal Swab  Result Value Ref Range Status   SARS Coronavirus 2 NEGATIVE NEGATIVE Final    Comment: (NOTE) SARS-CoV-2 target nucleic acids are NOT DETECTED. The SARS-CoV-2 RNA is generally detectable in upper and lower respiratory specimens during the acute phase of infection. Negative results do not preclude SARS-CoV-2 infection, do not rule out co-infections with other pathogens, and should not be used as the sole basis for treatment or other patient management decisions. Negative results must be combined with clinical observations, patient history, and epidemiological information. The expected result is Negative. Fact Sheet for Patients: SugarRoll.be Fact Sheet for Healthcare Providers: https://www.woods-mathews.com/ This test is not yet approved or cleared by the [place] FDA and  has been authorized for detection and/or diagnosis of SARS-CoV-2 by FDA under an Emergency Use Authorization (EUA). This EUA will remain  in effect (meaning this test can be used) for the duration of the COVID-19 declaration under Section 56 4(b)(1) of the Act, 21 U.S.C. section 360bbb-3(b)(1), unless the authorization is  terminated or revoked sooner. Performed at [facility], [place] [address] [place], [place] [address]   MRSA PCR Screening     Status: None   Collection Time: 08/29/19  5:00 PM   Specimen: Nasal Mucosa; Nasopharyngeal  Result Value Ref Range Status   MRSA by PCR NEGATIVE NEGATIVE Final    Comment:        The GeneXpert MRSA Assay (FDA approved for NASAL specimens only), is one component of a comprehensive MRSA colonization surveillance program. It is not intended to diagnose MRSA infection nor to guide or monitor treatment for MRSA infections. Performed at [facility], [place] [address] [place], [place] [address]   Culture, blood (routine x 2)     Status: None   Collection Time: 08/30/19  8:10 AM   Specimen: BLOOD  Result Value Ref Range Status   Specimen Description   Final    BLOOD LEFT HAND Performed at [place] [address], [place], [place] [address]    Special Requests   Final    BOTTLES DRAWN AEROBIC AND ANAEROBIC Blood Culture adequate volume Performed at [place] [address] [place], [place] [address]    Culture   Final    NO  GROWTH 5 DAYS Performed at [facility], [place] [address] [place], [place] [address]    Report Status 09/04/2019 FINAL  Final  Culture, blood (routine x 2)     Status: None   Collection Time: 08/30/19  8:21 AM   Specimen: BLOOD  Result Value Ref Range Status   Specimen Description   Final    BLOOD RIGHT HAND Performed at [place] [address], [place], [place] [address]    Special Requests   Final    BOTTLES DRAWN AEROBIC ONLY Blood Culture results may not be optimal due to an inadequate volume of blood received in culture bottles Performed at [place] [address] [place], [place] [address]    Culture   Final    NO GROWTH 5 DAYS Performed at [facility], [place] [address], [place], [place]  [address]    Report Status 09/04/2019 FINAL  Final  Body fluid culture     Status: None (Preliminary result)   Collection Time: 09/06/19  2:58 PM   Specimen: BILE; Body Fluid  Result Value Ref Range Status   Specimen Description   Final    BILE Performed at [place] [address], [place], [place] [address]    Special Requests   Final    Normal Performed at [facility], [place] [address] [place], [place] [address]    Gram Stain   Final    NO WBC SEEN ABUNDANT YEAST Performed at [facility], [place] [address], [place], [place] [address]    Culture PENDING  Incomplete   Report Status PENDING  Incomplete         Radiology Studies: No results found.      Scheduled Meds: . allopurinol  100 mg Oral Daily  . apixaban  5 mg Oral BID  . diazepam  2.5 mg Oral QHS  . furosemide  40 mg Intravenous BID  . mouth rinse  15 mL Mouth Rinse BID  . metoprolol succinate  100 mg Oral Daily  . pantoprazole  40 mg Oral Daily  . sodium chloride flush  5 mL Intracatheter Q8H   Continuous Infusions: . sodium chloride 25 mL/hr at 09/06/19 1457  . piperacillin-tazobactam (ZOSYN)  IV 3.375 g (09/06/19 1511)     LOS: 8 days    Time spent: 26 min    Kathie Dike, MD [facility]  If 7PM-7AM, please contact night-coverage  09/06/2019, 9:05 PM

## 2019-09-06 NOTE — Care Management Important Message (Signed)
Important Message  Patient Details IM Letter given to [facility] SW to present to the Patient Name: Kristen [name] MRN: [id number] Date of Birth: 06-04-1941   Medicare Important Message Given:  Yes     Alysandra, [place] 09/06/2019, 11:37 AM

## 2019-09-07 LAB — COMPREHENSIVE METABOLIC PANEL
ALT: 31 U/L (ref 0–44)
AST: 38 U/L (ref 15–41)
Albumin: 2.6 g/dL — ABNORMAL LOW (ref 3.5–5.0)
Alkaline Phosphatase: 99 U/L (ref 38–126)
Anion gap: 10 (ref 5–15)
BUN: 16 mg/dL (ref 8–23)
CO2: 26 mmol/L (ref 22–32)
Calcium: 8.4 mg/dL — ABNORMAL LOW (ref 8.9–10.3)
Chloride: 100 mmol/L (ref 98–111)
Creatinine, Ser: 1.01 mg/dL — ABNORMAL HIGH (ref 0.44–1.00)
GFR calc Af Amer: >60 mL/min (ref >60)
GFR calc non Af Amer: 53 mL/min — ABNORMAL LOW (ref >60)
Glucose, Bld: 108 mg/dL — ABNORMAL HIGH (ref 70–99)
Potassium: 3.3 mmol/L — ABNORMAL LOW (ref 3.5–5.1)
Sodium: 136 mmol/L (ref 135–145)
Total Bilirubin: 1.2 mg/dL (ref 0.3–1.2)
Total Protein: 6.2 g/dL — ABNORMAL LOW (ref 6.5–8.1)

## 2019-09-07 LAB — CBC
HCT: 31.5 % — ABNORMAL LOW (ref 36.0–46.0)
Hemoglobin: 10.2 g/dL — ABNORMAL LOW (ref 12.0–15.0)
MCH: 31.1 pg (ref 26.0–34.0)
MCHC: 32.4 g/dL (ref 30.0–36.0)
MCV: 96 fL (ref 80.0–100.0)
Platelets: 463 10*3/uL — ABNORMAL HIGH (ref 150–400)
RBC: 3.28 MIL/uL — ABNORMAL LOW (ref 3.87–5.11)
RDW: 13.6 % (ref 11.5–15.5)
WBC: 14.5 10*3/uL — ABNORMAL HIGH (ref 4.0–10.5)
nRBC: 0 % (ref 0.0–0.2)

## 2019-09-07 MED ORDER — FUROSEMIDE 20 MG PO TABS
20.0000 mg | ORAL_TABLET | Freq: Every day | ORAL | Status: DC
  Administered 2019-09-08 – 2019-09-17 (×10): 20 mg via ORAL
  Filled 2019-09-07 (×10): qty 1

## 2019-09-07 MED ORDER — POTASSIUM CHLORIDE 20 MEQ PO PACK
40.0000 meq | PACK | Freq: Once | ORAL | Status: AC
  Administered 2019-09-07: 13:00:00 40 meq via ORAL
  Filled 2019-09-07: qty 2

## 2019-09-07 NOTE — Progress Notes (Signed)
PT Cancellation Note  Patient Details Name: Kristen [name] MRN: [id number] DOB: 23-Nov-1940   Cancelled Treatment:    Reason Eval/Treat Not Completed: Other (comment). Pt wanting to eat. Cleaned off dentures for pt so she could eat.  Will check back as schedule permits.   Galen Manila 09/07/2019, 9:59 AM

## 2019-09-07 NOTE — Progress Notes (Signed)
Pt instructed on the use of Incentive Spirometry.

## 2019-09-07 NOTE — Progress Notes (Signed)
PROGRESS NOTE    Kristen [name]  RXV:[id number] DOB: 07/09/1941 DOA: 08/28/2019 PCP: [name], MD   Brief Narrative:  Patient is a 8 female with history of anxiety, A. fib on Eliquis, hypertension, CKD with uncertain baseline, who presents to the emergency room with abdominal pain. Patient underwent laparoscopic cholecystectomy on 08/22/2019 and was recovering uneventfully back to home but started developing severe abdomen pain on right upper quadrant with radiation towards the right shoulder blade so she presented to the emergency department. In the emergency department she was found to be hypoxic, tachycardic. Chest x-ray was concerning for interstitial edema. BNP was elevated her blood work showed creatinine of 3.79 up from 1.3 ,a week earlier. CT abdomen/pelvis revealed fluid collection in the gallbladder fossa. General surgery consulted. HIDA scan done and itshowed biliary leak and no activity in the small bowel. While in the emergency department, she alsodeveloped A. fib with RVR and had to be started on Cardizem drip.  Underwent biliary drainby IR on 08/30/19. ERCP by GI 10/27 with stent placement   Assessment & Plan:   Principal Problem:   Acute renal failure (ARF) ([place]) Active Problems:   Hypertensive heart disease   Permanent atrial fibrillation (HCC)   Acute on chronic diastolic CHF (congestive heart failure) (HCC)   Situational mixed anxiety and depressive disorder   Intraabdominal fluid collection   A-fib (HCC)   Abdominal pain/biliary leak:HIDA scan was suggestive of biliary leak. presented with severe right upper quadrant pain. History of cholecystectomy on October 15. She had severe tenderness in the right upper quadrant. CT scan showed fluid collection in the gallbladder fossa.  -Underwent biliary drain by IR with yield ofsignificant amount of biliary fluid.  -GI, general surgery following.   -ERCP Tuesday with choledocholithiasis noted  with removal of stone, appreciate GI input, -Patient developed postprocedure pancreatitis.  Lipase was significantly elevated.  Clinically appears to be improving.  Recheck lipase in a.m. -patient with severe abdominal pain postop although significantly improved 10/29, restarted Eliquis 10/28,  -Diet has been advanced to solid food and she appears to be tolerating this. -Continue on intravenous Zosyn for now.  Anticipate discontinuing antibiotics tomorrow if patient continues to improve and WBC count is trending down.  Acute respiratory failure with hypoxia.  Secondary to CHF exacerbation.  Improving with IV diuresis.  We will try and wean off oxygen today.  Acute kidney injury:She was also on ibuprofen and lisinopril at home. AKI improved. Monitor BMP.   Acute on chronic diastolicCHF/acute hypoxic respiratory failure:Echocardiogram showed EF was 60 to 65%.  Developed worsening shortness of breath related to IV fluids.  Chest x-ray showed vascular congestion.  Started on IV Lasix with good urine output.  Overall shortness of breath is improving.  Will transition back to her home dose of Lasix.  A. fib with JKD:[id number] at least 5 (age x2, gender, CHF, HTN). Went into A. fib in the emergency department. She was startedCardizem drip with improvement ,now stopped. On eliquis for anticoagulation which has been restarted 10/28,continue metoprolol for rate control   Hypertension: Currently blood pressure stable. Continue current medicines.  Hypokalemia: Supplemented with potassium.  DVT prophylaxis: KD:[id number]  Code Status: Full    Code Status Orders  (From admission, onward)         Start     Ordered   08/29/19 0425  Full code  Continuous     08/29/19 0424        Code Status History    This patient has  a current code status but no historical code status.   Advance Care Planning Activity    Advance Directive Documentation     Most Recent Value  Type of Advance  Directive  Healthcare Power of Attorney, Living will  Pre-existing out of facility DNR order (yellow form or pink MOST form)  -  "MOST" Form in Place?  -     Family Communication: Discussed with patient in detail Disposition Plan:   Patient remained inpatient for continued IV antibiotics, slow advancing of diet, patient not able to tolerate p.o. intake, still with abdominal pain requiring IV IV pain meds.  She is also short of breath requiring oxygen.  Patient not medically ready for discharge Consults called: None Admission status: Inpatient   Consultants:   GI, general surgery  Procedures:  Ct Abdomen Pelvis Wo Contrast  Result Date: 08/29/2019 CLINICAL DATA:  Acute generalized abdominal pain. Status post cholecystectomy 08/22/2019. Patient not a pain medication. EXAM: CT ABDOMEN AND PELVIS WITHOUT CONTRAST TECHNIQUE: Multidetector CT imaging of the abdomen and pelvis was performed following the standard protocol without IV contrast. COMPARISON:  CT 08/10/2010, no interval CT FINDINGS: Lower chest: Small right and trace left pleural effusion. Bilateral lower lobe atelectasis. Compressive atelectasis in the right middle lobe related to elevated hemidiaphragm. Cardiomegaly. Hepatobiliary: Post cholecystectomy with fluid collection in the gallbladder fossa measuring 6.5 x 2.5 cm. Fluid measures simple density. Tiny dependent hyperdensities in the fluid collection, images 27 and 28 series 2 are nonspecific may represent drop stones. Small volume of simple perihepatic free fluid. Common bile duct is poorly defined, however measures approximately 10 mm. No visualized choledocholithiasis. Suggestion of slight nodular hepatic contours. No evidence of focal hepatic lesion. Pancreas: No ductal dilatation or inflammation. Spleen: Normal in size without focal abnormality. Adrenals/Urinary Tract: Normal adrenal glands. Extrarenal pelvis configuration of the right greater than left kidney. No renal stones. No  perinephric edema. Urinary bladder is physiologically distended. No wall thickening. No bladder stone. Stomach/Bowel: Ingested contrast in the stomach. No gastric wall thickening. Administered enteric contrast reaches mid distal small bowel. No small bowel obstruction. Normal appendix. Moderate stool in the proximal colon. Small volume of stool in the more distal colon. Mild distal descending diverticulosis, prominent diverticulosis in the sigmoid. No evidence of diverticulitis. Vascular/Lymphatic: Mild aortic atherosclerosis. No aneurysm. Definite enlarged lymph nodes in the abdomen or pelvis. Reproductive: Atrophic uterus. No gross adnexal mass. Other: Small volume simple free fluid in the pelvis. Minimal fluid in the right pericolic gutter. No free air. Expected stranding in the subcutaneous tissues port sites. No subcutaneous fluid collection. Musculoskeletal: There are no acute or suspicious osseous abnormalities. Scoliosis and degenerative change in the spine. IMPRESSION: 1. Post recent cholecystectomy with fluid collection in the gallbladder fossa measuring 6.5 x 2.5 cm, may be postoperative seroma or biloma. Tiny dependent hyperdensities within the fluid collection are nonspecific, may represent drop stones or potentially surgical packing. Consider nuclear medicine hepatic biliary scan if there is concern for biloma. 2. Prominent common bile duct at 10 mm, may be normal post cholecystectomy. 3. Additional free fluid about the liver and tracking into the pelvis measures simple fluid density. 4. Small right pleural effusion.  Bibasilar atelectasis. 5. Colonic diverticulosis without diverticulitis. Aortic Atherosclerosis (ICD10-I70.0). Electronically Signed   By: Keith Rake M.D.   On: 08/29/2019 02:36   Dg Chest Port 1 View  Result Date: 09/06/2019 CLINICAL DATA:  Short of breath EXAM: PORTABLE CHEST 1 VIEW COMPARISON:  08/28/2019 FINDINGS: Small pleural effusions. Cardiomegaly  with vascular  congestion and diffuse interstitial and hazy pulmonary edema. No pneumothorax. IMPRESSION: Mild cardiomegaly with vascular congestion and mild pulmonary edema. There are suspected small pleural effusions. Left basilar atelectasis or pneumonia Electronically Signed   By: [name] M.D.   On: 09/06/2019 17:41   Dg Chest Port 1 View  Result Date: 08/28/2019 CLINICAL DATA:  CHF, shortness of breath EXAM: PORTABLE CHEST 1 VIEW COMPARISON:  None. FINDINGS: There is mild cardiomegaly. Pulmonary vascular congestion is seen. There is mildly increased interstitial markings throughout both lungs. There is blunting of the left costophrenic angle which could be due to a trace left pleural effusion. No acute osseous abnormality. IMPRESSION: Mild cardiomegaly and interstitial edema. Probable trace left pleural effusion. Electronically Signed   By: Prudencio Pair M.D.   On: 08/28/2019 22:34   Dg Ercp  Result Date: 09/03/2019 CLINICAL DATA:  78 year old female with a history of sphincterotomy for choledocholithiasis EXAM: ERCP TECHNIQUE: Multiple spot images obtained with the fluoroscopic device and submitted for interpretation post-procedure. FLUOROSCOPY TIME:  Fluoroscopy Time: 4 minutes 29 seconds COMPARISON:  None. FINDINGS: Limited images during ERCP. Initial image demonstrates endoscope projecting over the upper abdomen with a safety wire in position in the extrahepatic biliary ducts and partial opacification with contrast. Subsequently there is evidence retrieval balloon deployment and placement of a plastic biliary stent. Ill-defined contrast between the common bile duct and the pigtail drainage catheter present. Pigtail drainage catheter in place in the upper abdomen. IMPRESSION: Limited images during ERCP demonstrates treatment of choledocholithiasis with deployment of a retrieval balloon and placement of a plastic biliary stent. Ill-defined contrast between the common bile duct and the pigtail drainage  catheter may reflect leakage or refluxed contrast in a cystic duct stump. Please refer to the dictated operative report for full details of intraoperative findings and procedure. Electronically Signed   By: Corrie Mckusick D.O.   On: 09/03/2019 13:11   Ct Image Guided Drainage By Percutaneous Catheter  Result Date: 08/30/2019 INDICATION: 78 year old female with post cholecystectomy bile leak. She presents for CT-guided drain placement. EXAM: CT-guided drain placement MEDICATIONS: The patient is currently admitted to the hospital and receiving intravenous antibiotics. The antibiotics were administered within an appropriate time frame prior to the initiation of the procedure. ANESTHESIA/SEDATION: Fentanyl 100 mcg IV; Versed 4 mg IV, 1 mg Dilaudid Moderate Sedation Time:  34 minutes The patient was continuously monitored during the procedure by the interventional radiology nurse under my direct supervision. COMPLICATIONS: None immediate. PROCEDURE: Informed written consent was obtained from the patient after a thorough discussion of the procedural risks, benefits and alternatives. All questions were addressed. A timeout was performed prior to the initiation of the procedure. A planning axial CT scan was performed. The fluid collection in the gallbladder fossa was successfully identified. A suitable skin entry site was selected and marked. Local anesthesia was attained by infiltration with 1% lidocaine. A small dermatotomy was made. Under intermittent CT guidance, an 18 gauge trocar needle was advanced into the fluid collection. A 0.035 wire was then advanced into the fluid collection. The needle was removed. The soft tissue tract was dilated to 10 [place] and a [place] all-purpose drainage catheter was advanced over the wire and formed in the fluid collection. Aspiration yields several 100 mL of bilious fluid. The catheter was secured in place with 0 Prolene suture and connected to JP bulb suction. Follow-up  CT imaging demonstrates a well-positioned drainage catheter with significantly decreased fluid in the gallbladder fossa.  However, some of the fluid has leaked into the subcutaneous soft tissues along the catheter tubing. IMPRESSION: Successful placement of 10 French drainage catheter into the gallbladder fossa with aspiration of several 100 mL bilious fluid. Electronically Signed   By: Jacqulynn Cadet M.D.   On: 08/30/2019 13:47   Nm Hepato Biliary Leak  Addendum Date: 08/29/2019   ADDENDUM REPORT: 08/29/2019 11:46 ADDENDUM: These results were called by telephone at the time of interpretation on 08/29/2019 at 11:46 am to provider Dr. Marlou Starks, who verbally acknowledged these results. Electronically Signed   By: Zetta Bills M.D.   On: 08/29/2019 11:46   Result Date: 08/29/2019 CLINICAL DATA:  History of suspected bile leak. EXAM: NUCLEAR MEDICINE HEPATOBILIARY IMAGING TECHNIQUE: Sequential images of the abdomen were obtained out to 60 minutes following intravenous administration of radiopharmaceutical. RADIOPHARMACEUTICALS:  5.1 mCi Tc-57m  Choletec IV COMPARISON:  CT study of 08/29/2019 FINDINGS: Prompt uptake of radiotracer into hepatic parenchyma with excretion into the biliary tree is noted. An amorphous collection of radiotracer is demonstrated, enlarging over time, over the right upper quadrant extending into the expected area of gallbladder fossa and porta hepatis. Likely tracking up over the liver as well. IMPRESSION: Nuclear medicine signs of biliary leak. No signs of small bowel activity. A call is out to the referring provider to discuss findings in the above case. Electronically Signed: By: Zetta Bills M.D. On: 08/29/2019 11:39    Antimicrobials:   Zosyn >10/24   Subjective: Feels her breathing is improving as compared to yesterday.  Tolerating diet.  No nausea or vomiting.  Objective: Vitals:   09/06/19 2021 09/07/19 0603 09/07/19 1110 09/07/19 1411  BP: (!) 151/59 119/63  (!)  112/47  Pulse: 64 72  74  Resp: 17 17  16   Temp: 99 F (37.2 C) 98.2 F (36.8 C)  (!) 97.4 F (36.3 C)  TempSrc: Oral Oral    SpO2: 97% 96% 96% 97%  Weight:      Height:        Intake/Output Summary (Last 24 hours) at 09/07/2019 1817 Last data filed at 09/07/2019 1628 Gross per 24 hour  Intake 5 ml  Output 3500 ml  Net -3495 ml   Filed Weights   09/02/19 0500 09/03/19 0351 09/05/19 0500  Weight: 97.1 kg 95.6 kg 95 kg    Examination:  General exam: Alert, awake, oriented x 3 Respiratory system: Crackles at bases. Respiratory effort normal. Cardiovascular system:RRR. No murmurs, rubs, gallops. Gastrointestinal system: Abdomen is nondistended, soft and nontender. No organomegaly or masses felt. Normal bowel sounds heard. Central nervous system: Alert and oriented. No focal neurological deficits. Extremities: Trace edema bilaterally Skin: No rashes, lesions or ulcers Psychiatry: Judgement and insight appear normal. Mood & affect appropriate.     Data Reviewed: I have personally reviewed following labs and imaging studies  CBC: Recent Labs  Lab 09/01/19 0229  09/03/19 0154 09/04/19 0201 09/05/19 0202 09/06/19 0515 09/07/19 0529  WBC 10.9*   < > 10.0 10.7* 12.6* 15.6* 14.5*  NEUTROABS 5.5  --   --   --   --   --   --   HGB 10.1*   < > 10.7* 11.1* 10.6* 9.7* 10.2*  HCT 31.1*   < > 33.1* 34.5* 33.1* 29.8* 31.5*  MCV 97.2   < > 96.2 97.5 97.1 97.1 96.0  PLT 281   < > 402* 405* 422* 430* 463*   < > = values in this interval not displayed.   Basic  Metabolic Panel: Recent Labs  Lab 09/01/19 0229 09/03/19 0154 09/04/19 0858 09/05/19 0202 09/07/19 0529  NA 135 137 136 133* 136  K 3.6 3.7 3.6 4.0 3.3*  CL 102 104 103 104 100  CO2 22 22 23  20* 26  GLUCOSE 93 95 106* 98 108*  BUN 31* 20 22 19 16   CREATININE 1.52* 1.14* 1.15* 0.98 1.01*  CALCIUM 8.1* 8.5* 8.7* 8.5* 8.4*   GFR: Estimated Creatinine Clearance: 48.6 mL/min (A) (by C-G formula based on SCr of 1.01  mg/dL (H)). Liver Function Tests: Recent Labs  Lab 09/01/19 0229 09/03/19 0154 09/04/19 0858 09/05/19 0202 09/07/19 0529  AST 25 29 35 38 38  ALT 14 18 23 25 31   ALKPHOS 94 102 103 113 99  BILITOT 1.2 0.7 0.7 1.0 1.2  PROT 6.0* 6.5 6.4* 6.5 6.2*  ALBUMIN 2.7* 2.5* 2.7* 2.7* 2.6*   Recent Labs  Lab 09/04/19 0858  LIPASE 2,732*   No results for input(s): AMMONIA in the last 168 hours. Coagulation Profile: No results for input(s): INR, PROTIME in the last 168 hours. Cardiac Enzymes: No results for input(s): CKTOTAL, CKMB, CKMBINDEX, TROPONINI in the last 168 hours. BNP (last 3 results) No results for input(s): PROBNP in the last 8760 hours. HbA1C: No results for input(s): HGBA1C in the last 72 hours. CBG: No results for input(s): GLUCAP in the last 168 hours. Lipid Profile: No results for input(s): CHOL, HDL, LDLCALC, TRIG, CHOLHDL, LDLDIRECT in the last 72 hours. Thyroid Function Tests: No results for input(s): TSH, T4TOTAL, FREET4, T3FREE, THYROIDAB in the last 72 hours. Anemia Panel: No results for input(s): VITAMINB12, FOLATE, FERRITIN, TIBC, IRON, RETICCTPCT in the last 72 hours. Sepsis Labs: No results for input(s): PROCALCITON, LATICACIDVEN in the last 168 hours.  Recent Results (from the past 240 hour(s))  SARS CORONAVIRUS 2 (TAT 6-24 HRS) Nasopharyngeal Nasopharyngeal Swab     Status: None   Collection Time: 08/28/19 11:25 PM   Specimen: Nasopharyngeal Swab  Result Value Ref Range Status   SARS Coronavirus 2 NEGATIVE NEGATIVE Final    Comment: (NOTE) SARS-CoV-2 target nucleic acids are NOT DETECTED. The SARS-CoV-2 RNA is generally detectable in upper and lower respiratory specimens during the acute phase of infection. Negative results do not preclude SARS-CoV-2 infection, do not rule out co-infections with other pathogens, and should not be used as the sole basis for treatment or other patient management decisions. Negative results must be combined with  clinical observations, patient history, and epidemiological information. The expected result is Negative. Fact Sheet for Patients: SugarRoll.be Fact Sheet for Healthcare Providers: https://www.woods-mathews.com/ This test is not yet approved or cleared by the [place] FDA and  has been authorized for detection and/or diagnosis of SARS-CoV-2 by FDA under an Emergency Use Authorization (EUA). This EUA will remain  in effect (meaning this test can be used) for the duration of the COVID-19 declaration under Section 56 4(b)(1) of the Act, 21 U.S.C. section 360bbb-3(b)(1), unless the authorization is terminated or revoked sooner. Performed at [facility], [place] [address] [place],  [address]   MRSA PCR Screening     Status: None   Collection Time: 08/29/19  5:00 PM   Specimen: Nasal Mucosa; Nasopharyngeal  Result Value Ref Range Status   MRSA by PCR NEGATIVE NEGATIVE Final    Comment:        The GeneXpert MRSA Assay (FDA approved for NASAL specimens only), is one component of a comprehensive MRSA colonization surveillance program. It is not  intended to diagnose MRSA infection nor to guide or monitor treatment for MRSA infections. Performed at [facility], [place] [address], [place], [place] [address]   Culture, blood (routine x 2)     Status: None   Collection Time: 08/30/19  8:10 AM   Specimen: BLOOD  Result Value Ref Range Status   Specimen Description   Final    BLOOD LEFT HAND Performed at [facility] [address], [place], [place] [address]    Special Requests   Final    BOTTLES DRAWN AEROBIC AND ANAEROBIC Blood Culture adequate volume Performed at [facility] [address], [place], [place] [address]    Culture   Final    NO GROWTH 5 DAYS Performed at [facility], [place] [address], [place], [place] [address]    Report Status 09/04/2019 FINAL   Final  Culture, blood (routine x 2)     Status: None   Collection Time: 08/30/19  8:21 AM   Specimen: BLOOD  Result Value Ref Range Status   Specimen Description   Final    BLOOD RIGHT HAND Performed at [place] [address] [place], [place] [address]    Special Requests   Final    BOTTLES DRAWN AEROBIC ONLY Blood Culture results may not be optimal due to an inadequate volume of blood received in culture bottles Performed at [place] [address], [place], [place] [address]    Culture   Final    NO GROWTH 5 DAYS Performed at [facility], [place] [address] [place], [place] [address]    Report Status 09/04/2019 FINAL  Final  Body fluid culture     Status: None (Preliminary result)   Collection Time: 09/06/19  2:58 PM   Specimen: BILE; Body Fluid  Result Value Ref Range Status   Specimen Description   Final    BILE Performed at [facility] [address] [place], [place] [address]    Special Requests   Final    Normal Performed at [facility], [place] [address] [place],  [address]    Gram Stain   Final    NO WBC SEEN ABUNDANT YEAST Performed at [facility], [place] [address], [place],  [address]    Culture ABUNDANT YEAST  Final   Report Status PENDING  Incomplete         Radiology Studies: Dg Chest Port 1 View  Result Date: 09/06/2019 CLINICAL DATA:  Short of breath EXAM: PORTABLE CHEST 1 VIEW COMPARISON:  08/28/2019 FINDINGS: Small pleural effusions. Cardiomegaly with vascular congestion and diffuse interstitial and hazy pulmonary edema. No pneumothorax. IMPRESSION: Mild cardiomegaly with vascular congestion and mild pulmonary edema. There are suspected small pleural effusions. Left basilar atelectasis or pneumonia Electronically Signed   By: [name] M.D.   On: 09/06/2019 17:41        Scheduled Meds: . allopurinol  100 mg Oral Daily  .  apixaban  5 mg Oral BID  . diazepam  2.5 mg Oral QHS  . furosemide  40 mg Intravenous BID  . mouth rinse  15 mL Mouth Rinse BID  . metoprolol succinate  100 mg Oral Daily  . pantoprazole  40 mg Oral Daily  . sodium chloride flush  5 mL Intracatheter Q8H   Continuous Infusions: . sodium chloride 25 mL/hr at 09/06/19 1457  . piperacillin-tazobactam (ZOSYN)  IV 3.375 g (  09/07/19 1534)     LOS: 9 days    Time spent: 35 min    Kathie Dike, MD [facility]  If 7PM-7AM, please contact night-coverage  09/07/2019, 6:17 PM

## 2019-09-07 NOTE — Progress Notes (Signed)
[facility] PROGRESS NOTE Subjective Patient notes that she is having less drainage of biliary drain placed by IR and the pain is less.  She had ERCP with stent placement 3 days ago by Dr. Lu Duffel guide with some postprocedural pancreatitis.  She notes this is feeling better  Objective: Vital signs in last 24 hours: Temp:  [97.7 F (36.5 C)-99 F (37.2 C)] 98.2 F (36.8 C) (10/31 0603) Pulse Rate:  [64-90] 72 (10/31 0603) Resp:  [17-20] 17 (10/31 0603) BP: (119-151)/(59-66) 119/63 (10/31 0603) SpO2:  [96 %-97 %] 96 % (10/31 1110) FiO2 (%):  [2 %] 2 % (10/31 1110) Last BM Date: 09/05/19  Intake/Output from previous day: 10/30 0701 - 10/31 0700 In: 3031.6 [P.O.:360; I.V.:2516.6; IV Piggyback:150] Out: 2770 [Urine:2750; Drains:20] Intake/Output this shift: Total I/O In: -  Out: 1500 [Urine:1500]  PE:  Abdomen--still some tenderness.  IR placed biliary drain appears to be empty  Lab Results: Recent Labs    09/05/19 0202 09/06/19 0515 09/07/19 0529  WBC 12.6* 15.6* 14.5*  HGB 10.6* 9.7* 10.2*  HCT 33.1* 29.8* 31.5*  PLT 422* 430* 463*   BMET Recent Labs    09/05/19 0202 09/07/19 0529  NA 133* 136  K 4.0 3.3*  CL 104 100  CO2 20* 26  CREATININE 0.98 1.01*   LFT Recent Labs    09/05/19 0202 09/07/19 0529  PROT 6.5 6.2*  AST 38 38  ALT 25 31  ALKPHOS 113 99  BILITOT 1.0 1.2   PT/INR No results for input(s): LABPROT, INR in the last 72 hours. PANCREAS No results for input(s): LIPASE in the last 72 hours.       Studies/Results: Dg Chest Port 1 View  Result Date: 09/06/2019 CLINICAL DATA:  Short of breath EXAM: PORTABLE CHEST 1 VIEW COMPARISON:  08/28/2019 FINDINGS: Small pleural effusions. Cardiomegaly with vascular congestion and diffuse interstitial and hazy pulmonary edema. No pneumothorax. IMPRESSION: Mild cardiomegaly with vascular congestion and mild pulmonary edema. There are suspected small pleural effusions. Left basilar atelectasis  or pneumonia Electronically Signed   By: [name] M.D.   On: 09/06/2019 17:41    Medications: I have reviewed the patient's current medications.  Assessment:   1.  Bile leak following cholecystectomy.  Has IR placed drain around the gallbladder as well as a biliary stent from ERCP 2.  Post ERCP pancreatitis appears to be improving   Plan: Would continue supportive therapy for now we will go ahead and check a lipase in the morning   Nancy Fetter 09/07/2019, 12:08 PM  This note was created using voice recognition software. Minor errors may Have occurred unintentionally.  Pager: [phone] If no answer or after hours call [phone]

## 2019-09-07 NOTE — Evaluation (Signed)
Physical Therapy Evaluation Patient Details Name: Kristen [name] MRN: [id number] DOB: 22-Apr-1941 Today's Date: 09/07/2019   History of Present Illness  Pt is s/p laparoscopic cholecystectomy on 08/22/2019 and then had biliary leak and no activity in the small bowel. Pt s/p biliary drain 10/23 and s/p ERCP 10/27. PMH: A fib, CHF, mixed anxiety and depressive disorder  Clinical Impression  Pt admitted with above diagnosis.  Pt currently with functional limitations due to the deficits listed below (see PT Problem List). Pt will benefit from skilled PT to increase their independence and safety with mobility to allow discharge to the venue listed below.  Pt able to transfer to recliner with RW with mild SOB which subsided once in chair. She declined further ambulation.  She lives alone and will need short term SNF rehab.       Follow Up Recommendations SNF    Equipment Recommendations  None recommended by PT    Recommendations for Other Services       Precautions / Restrictions Precautions Precautions: Fall Precaution Comments: JP drain Restrictions Weight Bearing Restrictions: No      Mobility  Bed Mobility Overal bed mobility: Needs Assistance Bed Mobility: Supine to Sit     Supine to sit: Min guard;HOB elevated     General bed mobility comments: Increased time and cues for technique  Transfers Overall transfer level: Needs assistance Equipment used: Rolling walker (2 wheeled) Transfers: Sit to/from [facility] Sit to Stand: Mod assist Stand pivot transfers: Min assist;+2 safety/equipment       General transfer comment: Slow transition from sit to stand with cues to stand upright. Pt feeling SOB with standing and declined gait and only agreeable to transfer to recliner.  Ambulation/Gait             General Gait Details: Pt declined  Stairs            Wheelchair Mobility    Modified Rankin (Stroke Patients Only)       Balance  Overall balance assessment: Needs assistance Sitting-balance support: Feet supported Sitting balance-Leahy Scale: Fair     Standing balance support: Bilateral upper extremity supported Standing balance-Leahy Scale: Poor Standing balance comment: requires UE support                             Pertinent Vitals/Pain Pain Assessment: 0-10 Pain Score: 3  Pain Location: back pain Pain Descriptors / Indicators: Constant Pain Intervention(s): Monitored during session    Home Living Family/patient expects to be discharged to:: Skilled nursing facility Living Arrangements: Alone                    Prior Function Level of Independence: Independent               Hand Dominance        Extremity/Trunk Assessment   Upper Extremity Assessment Upper Extremity Assessment: Overall WFL for tasks assessed    Lower Extremity Assessment Lower Extremity Assessment: Generalized weakness;Overall [facility] for tasks assessed    Cervical / Trunk Assessment Cervical / Trunk Assessment: Normal  Communication   Communication: No difficulties  Cognition Arousal/Alertness: Awake/alert Behavior During Therapy: WFL for tasks assessed/performed Overall Cognitive Status: Within Functional Limits for tasks assessed  General Comments      Exercises     Assessment/Plan    PT Assessment Patient needs continued PT services  PT Problem List Decreased strength;Decreased activity tolerance;Decreased mobility;Decreased knowledge of use of DME;Decreased safety awareness       PT Treatment Interventions DME instruction;Gait training;Functional mobility training;Balance training;Therapeutic exercise;Therapeutic activities;Patient/family education    PT Goals (Current goals can be found in the Care Plan section)  Acute Rehab PT Goals Patient Stated Goal: Go to rehab before she goes home. PT Goal Formulation: With patient Time  For Goal Achievement: 09/20/19 Potential to Achieve Goals: Good    Frequency Min 2X/week   Barriers to discharge Decreased caregiver support      Co-evaluation               AM-PAC PT "6 Clicks" Mobility  Outcome Measure Help needed turning from your back to your side while in a flat bed without using bedrails?: A Little Help needed moving from lying on your back to sitting on the side of a flat bed without using bedrails?: A Little Help needed moving to and from a bed to a chair (including a wheelchair)?: A Little Help needed standing up from a chair using your arms (e.g., wheelchair or bedside chair)?: A Little Help needed to walk in hospital room?: A Lot Help needed climbing 3-5 steps with a railing? : A Lot 6 Click Score: 16    End of Session Equipment Utilized During Treatment: Gait belt Activity Tolerance: Patient limited by fatigue Patient left: in chair;with call bell/phone within reach;with chair alarm set Nurse Communication: Mobility status PT Visit Diagnosis: Unsteadiness on feet (R26.81);Other abnormalities of gait and mobility (R26.89)    Time: 2482-5003 PT Time Calculation (min) (ACUTE ONLY): 31 min   Charges:   PT Evaluation $PT Eval Moderate Complexity: 1 Mod PT Treatments $Therapeutic Activity: 8-22 mins        Chandy Tarman L. Tamala Julian, [place] Pager [phone] 09/07/2019   Galen Manila 09/07/2019, 12:59 PM

## 2019-09-08 LAB — RENAL FUNCTION PANEL
Albumin: 2.4 g/dL — ABNORMAL LOW (ref 3.5–5.0)
Anion gap: 11 (ref 5–15)
BUN: 19 mg/dL (ref 8–23)
CO2: 29 mmol/L (ref 22–32)
Calcium: 8.6 mg/dL — ABNORMAL LOW (ref 8.9–10.3)
Chloride: 97 mmol/L — ABNORMAL LOW (ref 98–111)
Creatinine, Ser: 1.13 mg/dL — ABNORMAL HIGH (ref 0.44–1.00)
GFR calc Af Amer: 54 mL/min — ABNORMAL LOW (ref >60)
GFR calc non Af Amer: 46 mL/min — ABNORMAL LOW (ref >60)
Glucose, Bld: 96 mg/dL (ref 70–99)
Phosphorus: 2.9 mg/dL (ref 2.5–4.6)
Potassium: 3.4 mmol/L — ABNORMAL LOW (ref 3.5–5.1)
Sodium: 137 mmol/L (ref 135–145)

## 2019-09-08 LAB — BODY FLUID CULTURE
Gram Stain: NONE SEEN
Special Requests: NORMAL

## 2019-09-08 LAB — CBC
HCT: 33.1 % — ABNORMAL LOW (ref 36.0–46.0)
Hemoglobin: 10.7 g/dL — ABNORMAL LOW (ref 12.0–15.0)
MCH: 30.9 pg (ref 26.0–34.0)
MCHC: 32.3 g/dL (ref 30.0–36.0)
MCV: 95.7 fL (ref 80.0–100.0)
Platelets: 552 10*3/uL — ABNORMAL HIGH (ref 150–400)
RBC: 3.46 MIL/uL — ABNORMAL LOW (ref 3.87–5.11)
RDW: 13.6 % (ref 11.5–15.5)
WBC: 11 10*3/uL — ABNORMAL HIGH (ref 4.0–10.5)
nRBC: 0 % (ref 0.0–0.2)

## 2019-09-08 LAB — LIPASE, BLOOD: Lipase: 92 U/L — ABNORMAL HIGH (ref 11–51)

## 2019-09-08 NOTE — Progress Notes (Signed)
Referring Physician(s): Dr Alycia Rossetti And [facility]  Supervising Physician: Markus Daft  Patient Status:  [facility] - In-pt  Chief Complaint:  GB fossa abscess drain placed in IR 10/23  Subjective:  Drain intact She is restful no complaints GI signing off OP of GB fossa drain minimal    Allergies: Flecainide acetate  Medications: Prior to Admission medications   Medication Sig Start Date End Date Taking? Authorizing Provider  alendronate (FOSAMAX) 10 MG tablet TAKE (1) TABLET BY MOUTH PER WEEK TAKE ON EMPTY STOMACH WITH FULL GLASS OF WATER Patient taking differently: Take 10 mg by mouth daily before breakfast. TAKE (1) TABLET BY MOUTH PER WEEK TAKE ON EMPTY STOMACH WITH FULL GLASS OF WATER 05/02/19  Yes [name], MD  allopurinol (ZYLOPRIM) 100 MG tablet TAKE 1 TABLET BY MOUTH EVERY DAY Patient taking differently: Take 100 mg by mouth daily.  06/24/19  Yes [name], MD  calcium carbonate (OSCAL) 1500 (600 Ca) MG TABS tablet Take 1,500 mg by mouth daily.    Yes [provider]  calcium carbonate (TUMS - DOSED IN MG ELEMENTAL CALCIUM) 500 MG chewable tablet Chew 1 tablet by mouth as needed for indigestion or heartburn.   Yes [provider]  cholecalciferol (VITAMIN D3) 25 MCG (1000 UT) tablet Take 1,000 Units by mouth daily.   Yes [provider]  CINNAMON PO Take 1 tablet by mouth daily.    Yes [provider]  clindamycin (CLINDAGEL) 1 % gel APPLY TO AFFECTED AREA TWICE A DAY Patient taking differently: Apply 1 application topically 2 (two) times daily.  04/13/18  Yes [name], MD  colchicine 0.6 MG tablet Take 1 tablet (0.6 mg total) by mouth 2 (two) times daily. Take twice daily until pain resolves and then take as needed for gout flare. Patient taking differently: Take 0.6 mg by mouth 2 (two) times daily as needed. Take twice daily until pain resolves and then take as needed for gout flare. 06/15/18  Yes [name], MD  [facility]  ANTI-FUNGAL 2 % powder APPLY TO AFFECTED AREA TWICE A DAY AFTER SKIN RESOLVES FROM KETOCONAZOLE CREAM Patient taking differently: as needed.  10/17/14  Yes [name], MD  diazepam (VALIUM) 5 MG tablet TAKE 1/2 TABLET (2.5 MG TOTAL) BY MOUTH AT BEDTIME. Patient taking differently: Take 2.5 mg by mouth at bedtime.  07/08/19  Yes [name], MD  doxycycline (VIBRAMYCIN) 50 MG capsule TAKE 2 CAPSULES BY MOUTH 2 (TWO) TIMES DAILY. Patient taking differently: Take 100 mg by mouth See admin instructions. Twice daily As directed for deer tick's (allergic reaction) 04/25/18  Yes [name], MD  ELIQUIS 5 MG TABS tablet TAKE 1 TABLET BY MOUTH TWICE A DAY Patient taking differently: Take 5 mg by mouth 2 (two) times daily.  05/23/19  Yes Park Liter, MD  Flax Oil-Fish Oil-Borage Oil (FISH-FLAX-BORAGE) CAPS Take 1 capsule by mouth daily.   Yes [provider]  furosemide (LASIX) 20 MG tablet Take 1 tablet (20 mg total) by mouth daily. Patient taking differently: Take 20 mg by mouth daily.  08/19/19  Yes Park Liter, MD  glucosamine-chondroitin (GLUCOSAMINE-CHONDROITIN DS) 500-400 MG tablet Take 1 tablet by mouth daily.    Yes [provider]  HYDROcodone-acetaminophen (NORCO/VICODIN) 5-325 MG tablet Take 1 tablet by mouth every 6 (six) hours as needed for moderate pain. 08/22/19  Yes [name], [name], MD  ibuprofen (ADVIL) 800 MG tablet Take 1 tablet (800 mg total) by  mouth every 8 (eight) hours as needed. 08/22/19  Yes [name], [name], MD  lisinopril-hydrochlorothiazide (ZESTORETIC) 20-25 MG tablet Take 1 tablet by mouth daily. Patient taking differently: Take 1 tablet by mouth daily.  03/08/19  Yes [name], MD  loratadine (CLARITIN) 10 MG tablet Take 1 tablet (10 mg total) by mouth daily. 07/08/19  Yes [name], MD  metoprolol succinate (TOPROL-XL) 100 MG 24 hr tablet Take 1 tablet (100 mg total) by mouth daily. Take with or immediately  following a meal. Patient taking differently: Take 100 mg by mouth daily. Take with or immediately following a meal. 01/29/19 08/28/19 Yes Park Liter, MD  Multiple Vitamins-Minerals (EMERGEN-C IMMUNE) PACK Take 1 tablet by mouth daily.    Yes [provider]  Multiple Vitamins-Minerals (MULTIVITAMIN WOMEN 50+) TABS Take 1 tablet by mouth daily.    Yes [provider]  mupirocin ointment (BACTROBAN) 2 % APPLY TOPICALLY TO AFFECTED AREA TWICE DAILY Patient taking differently: as needed.  02/20/19  Yes [name], MD  Nutritional Supplements (ECHINACEA/GOLDEN SEAL PO) Take 1 tablet by mouth daily.    Yes [provider]  polycarbophil (FIBERCON) 625 MG tablet Take 625 mg by mouth daily.   Yes [provider]  potassium chloride (KLOR-CON) 20 MEQ packet MIX 1 PACKET IN LIQUID AND TAKE BY MOUTH DAILY AS DIRECTED Patient taking differently: Take 20 mEq by mouth daily. MIX 1 PACKET IN LIQUID AND TAKE BY MOUTH DAILY AS DIRECTED 02/20/19  Yes [name], MD  Probiotic Product (PROBIOTIC-10 PO) Take 1 tablet by mouth daily.    Yes [provider]  TURMERIC PO Take 1 tablet by mouth daily.    Yes [provider]     Vital Signs: BP (!) 109/51 (BP Location: Right Arm)   Pulse 83   Temp 98.9 F (37.2 C) (Oral)   Resp 14   Ht 5\' 2"  (1.575 m)   Wt 207 lb 0.2 oz (93.9 kg)   SpO2 92%   BMI 37.86 kg/m   Physical Exam Vitals signs reviewed.  Abdominal:     Tenderness: There is no abdominal tenderness.  Skin:    General: Skin is dry.     Comments: Site is clean and dry NT No bleeding OP minimal- serous color   Neurological:     Mental Status: She is alert.     Imaging: Dg Chest Port 1 View  Result Date: 09/06/2019 CLINICAL DATA:  Short of breath EXAM: PORTABLE CHEST 1 VIEW COMPARISON:  08/28/2019 FINDINGS: Small pleural effusions. Cardiomegaly with vascular congestion and diffuse interstitial and hazy pulmonary edema. No  pneumothorax. IMPRESSION: Mild cardiomegaly with vascular congestion and mild pulmonary edema. There are suspected small pleural effusions. Left basilar atelectasis or pneumonia Electronically Signed   By: [name] M.D.   On: 09/06/2019 17:41    Labs:  CBC: Recent Labs    09/05/19 0202 09/06/19 0515 09/07/19 0529 09/08/19 0552  WBC 12.6* 15.6* 14.5* 11.0*  HGB 10.6* 9.7* 10.2* 10.7*  HCT 33.1* 29.8* 31.5* 33.1*  PLT 422* 430* 463* 552*    COAGS: Recent Labs    08/29/19 1805 08/31/19 1226  INR 1.1 1.1  APTT  --  32    BMP: Recent Labs    09/04/19 0858 09/05/19 0202 09/07/19 0529 09/08/19 0552  NA 136 133* 136 137  K 3.6 4.0 3.3* 3.4*  CL 103 104 100 97*  CO2 23 20* 26 29  GLUCOSE 106* 98 108* 96  BUN  22 19 16 19   CALCIUM 8.7* 8.5* 8.4* 8.6*  CREATININE 1.15* 0.98 1.01* 1.13*  GFRNONAA 45* 55* 53* 46*  GFRAA 52* >60 >60 54*    LIVER FUNCTION TESTS: Recent Labs    09/03/19 0154 09/04/19 0858 09/05/19 0202 09/07/19 0529 09/08/19 0552  BILITOT 0.7 0.7 1.0 1.2  --   AST 29 35 38 38  --   ALT 18 23 25 31   --   ALKPHOS 102 103 113 99  --   PROT 6.5 6.4* 6.5 6.2*  --   ALBUMIN 2.5* 2.7* 2.7* 2.6* 2.4*    Assessment and Plan:  GB fossa drain intact OP minimal Flushing easily per RN Will need CT to evaluate collection GI signed off--- to see as OP Possible removal soon   Electronically Signed: Lavonia Drafts, PA-C 09/08/2019, 12:15 PM   I spent a total of 15 Minutes at the the patient's bedside AND on the patient's hospital floor or unit, greater than 50% of which was counseling/coordinating care for GB fossa drain

## 2019-09-08 NOTE — Progress Notes (Signed)
GI Discharge/Sign-Off Note  Subjective: Patient feeling much better, less pain lipase dropped dramatically.  Tolerating diet  Principal Problem:   Acute renal failure (ARF) (HCC) Active Problems:   Hypertensive heart disease   Permanent atrial fibrillation (HCC)   Acute on chronic diastolic CHF (congestive heart failure) (HCC)   Situational mixed anxiety and depressive disorder   Intraabdominal fluid collection   A-fib [facility])   Results for orders placed or performed during the hospital encounter of 08/28/19 (from the past 72 hour(s))  CBC     Status: Abnormal   Collection Time: 09/06/19  5:15 AM  Result Value Ref Range   WBC 15.6 (H) 4.0 - 10.5 K/uL   RBC 3.07 (L) 3.87 - 5.11 MIL/uL   Hemoglobin 9.7 (L) 12.0 - 15.0 g/dL   HCT 29.8 (L) 36.0 - 46.0 %   MCV 97.1 80.0 - 100.0 fL   MCH 31.6 26.0 - 34.0 pg   MCHC 32.6 30.0 - 36.0 g/dL   RDW 13.9 11.5 - 15.5 %   Platelets 430 (H) 150 - 400 K/uL   nRBC 0.0 0.0 - 0.2 %    Comment: Performed at [facility], [place] [address] [place], [place] [address]  Body fluid culture     Status: None (Preliminary result)   Collection Time: 09/06/19  2:58 PM   Specimen: BILE; Body Fluid  Result Value Ref Range   Specimen Description      BILE Performed at [facility], [place] [address] [place], [place] [address]    Special Requests      Normal Performed at [facility], [place] [address] [place], [place] [address]    Gram Stain      NO WBC SEEN ABUNDANT YEAST Performed at [facility], [place] [address] [place], [place] [address]    Culture ABUNDANT YEAST    Report Status PENDING   CBC     Status: Abnormal   Collection Time: 09/07/19  5:29 AM  Result Value Ref Range   WBC 14.5 (H) 4.0 - 10.5 K/uL   RBC 3.28 (L) 3.87 - 5.11 MIL/uL   Hemoglobin 10.2 (L) 12.0 - 15.0 g/dL   HCT 31.5 (L) 36.0 - 46.0 %   MCV 96.0 80.0 - 100.0 fL   MCH 31.1 26.0 - 34.0 pg   MCHC 32.4 30.0 - 36.0 g/dL    RDW 13.6 11.5 - 15.5 %   Platelets 463 (H) 150 - 400 K/uL   nRBC 0.0 0.0 - 0.2 %    Comment: Performed at [facility], [place] [address] [place],  [address]  Comprehensive metabolic panel     Status: Abnormal   Collection Time: 09/07/19  5:29 AM  Result Value Ref Range   Sodium 136 135 - 145 mmol/L   Potassium 3.3 (L) 3.5 - 5.1 mmol/L   Chloride 100 98 - 111 mmol/L   CO2 26 22 - 32 mmol/L   Glucose, Bld 108 (H) 70 - 99 mg/dL   BUN 16 8 - 23 mg/dL   Creatinine, Ser 1.01 (H) 0.44 - 1.00 mg/dL   Calcium 8.4 (L) 8.9 - 10.3 mg/dL   Total Protein 6.2 (L) 6.5 - 8.1 g/dL   Albumin 2.6 (L) 3.5 - 5.0 g/dL   AST 38 15 - 41 U/L   ALT 31 0 - 44 U/L   Alkaline Phosphatase 99 38 - 126 U/L   Total Bilirubin 1.2 0.3 - 1.2 mg/dL  GFR calc non Af Amer 53 (L) >60 mL/min   GFR calc Af Amer >60 >60 mL/min   Anion gap 10 5 - 15    Comment: Performed at [facility], [place] [address], [place], [place] [address]  CBC     Status: Abnormal   Collection Time: 09/08/19  5:52 AM  Result Value Ref Range   WBC 11.0 (H) 4.0 - 10.5 K/uL   RBC 3.46 (L) 3.87 - 5.11 MIL/uL   Hemoglobin 10.7 (L) 12.0 - 15.0 g/dL   HCT 33.1 (L) 36.0 - 46.0 %   MCV 95.7 80.0 - 100.0 fL   MCH 30.9 26.0 - 34.0 pg   MCHC 32.3 30.0 - 36.0 g/dL   RDW 13.6 11.5 - 15.5 %   Platelets 552 (H) 150 - 400 K/uL   nRBC 0.0 0.0 - 0.2 %    Comment: Performed at [facility], [place] [address] [place], [place] [address]  Renal function panel     Status: Abnormal   Collection Time: 09/08/19  5:52 AM  Result Value Ref Range   Sodium 137 135 - 145 mmol/L   Potassium 3.4 (L) 3.5 - 5.1 mmol/L   Chloride 97 (L) 98 - 111 mmol/L   CO2 29 22 - 32 mmol/L   Glucose, Bld 96 70 - 99 mg/dL   BUN 19 8 - 23 mg/dL   Creatinine, Ser 1.13 (H) 0.44 - 1.00 mg/dL   Calcium 8.6 (L) 8.9 - 10.3 mg/dL   Phosphorus 2.9 2.5 - 4.6 mg/dL   Albumin 2.4 (L) 3.5 - 5.0 g/dL   GFR calc non Af Amer 46 (L) >60  mL/min   GFR calc Af Amer 54 (L) >60 mL/min   Anion gap 11 5 - 15    Comment: Performed at [facility], [place] [address] [place], [place] [address]  Lipase, blood     Status: Abnormal   Collection Time: 09/08/19  5:52 AM  Result Value Ref Range   Lipase 92 (H) 11 - 51 U/L    Comment: Performed at [facility], [place] [address], [place], [place] [address]    Dg Chest Port 1 View  Result Date: 09/06/2019 CLINICAL DATA:  Short of breath EXAM: PORTABLE CHEST 1 VIEW COMPARISON:  08/28/2019 FINDINGS: Small pleural effusions. Cardiomegaly with vascular congestion and diffuse interstitial and hazy pulmonary edema. No pneumothorax. IMPRESSION: Mild cardiomegaly with vascular congestion and mild pulmonary edema. There are suspected small pleural effusions. Left basilar atelectasis or pneumonia Electronically Signed   By: [name] M.D.   On: 09/06/2019 17:41    @[email or web address] @  GI DISCHARGE PLANNING: 1.  Bile leak following cholecystectomy biliary stent in place 2.  Post ERCP pancreatitis resolving  Diet: As tolerated  Anticoagulation/Antiplatelet Rx Suggestions:   GI Medications:   Labs/Procedures Ordered:  GI FOLLOW UP:  Call [phone] to make appointment.   Doctor: Dr. Watt Climes to arrange removal of biliary stent  Time: Call and arrange an appointment or televisit in 4 weeks to arrange removal of the stent.   Laurence Spates, MD   Pager [phone] If no answer or after hours call [phone]

## 2019-09-08 NOTE — Progress Notes (Signed)
PROGRESS NOTE    Rylah Fukuda Alpert  NOM:[id number] DOB: Apr 10, 1941 DOA: 08/28/2019 PCP: [name], MD   Brief Narrative:  Patient is a 78 female with history of anxiety, A. fib on Eliquis, hypertension, CKD with uncertain baseline, who presents to the emergency room with abdominal pain. Patient underwent laparoscopic cholecystectomy on 08/22/2019 and was recovering uneventfully back to home but started developing severe abdomen pain on right upper quadrant with radiation towards the right shoulder blade so she presented to the emergency department. In the emergency department she was found to be hypoxic, tachycardic. Chest x-ray was concerning for interstitial edema. BNP was elevated her blood work showed creatinine of 3.79 up from 1.3 ,a week earlier. CT abdomen/pelvis revealed fluid collection in the gallbladder fossa. General surgery consulted. HIDA scan done and itshowed biliary leak and no activity in the small bowel. While in the emergency department, she alsodeveloped A. fib with RVR and had to be started on Cardizem drip.  Underwent biliary drainby IR on 08/30/19. ERCP by GI 10/27 with stent placement, now tolerating diet and pancreatitis resolved.  Assessment & Plan:   Principal Problem:   Acute renal failure (ARF) (HCC) Active Problems:   Hypertensive heart disease   Permanent atrial fibrillation (HCC)   Acute on chronic diastolic CHF (congestive heart failure) (HCC)   Situational mixed anxiety and depressive disorder   Intraabdominal fluid collection   A-fib (HCC)   Abdominal pain/biliary leak:HIDA scan was suggestive of biliary leak. presented with severe right upper quadrant pain. History of cholecystectomy on October 15. She had severe tenderness in the right upper quadrant. CT scan showed fluid collection in the gallbladder fossa.  -Underwent biliary drain by IR with yield ofsignificant amount of biliary fluid.  -GI, general surgery following.    -ERCP Tuesday with choledocholithiasis noted with removal of stone, appreciate GI input, -Patient developed postprocedure pancreatitis.  Lipase was significantly elevated.  Clinically appears to be improving.  Recheck lipase today much better. -patient with severe abdominal pain postop although significantly improved 10/29, restarted Eliquis 10/28 -Diet has been advanced to solid food and she appears to be tolerating this, though appetite has been poor -Continue on intravenous Zosyn for now.  Anticipate discontinuing antibiotics when IR drain removed, or after total 7 days  Acute respiratory failure with hypoxia.  Secondary to CHF exacerbation.  Improving with IV diuresis. No on 1L [place] O2, continue to try and wean.  Acute kidney injury:She was also on ibuprofen and lisinopril at home. AKI improved. Monitor BMP.   Acute on chronic diastolicCHF/acute hypoxic respiratory failure:Echocardiogram showed EF was 60 to 65%.  Developed worsening shortness of breath related to IV fluids.  Chest x-ray showed vascular congestion.  Started on IV Lasix with good urine output.  Overall shortness of breath is improving and almost on room air.  Will transition back to her home dose of Lasix.  A. fib with JGG:EZMOQ-HUTM at least 5 (age x2, gender, CHF, HTN). Went into A. fib in the emergency department. She was startedCardizem drip with improvement ,now stopped. On eliquis for anticoagulation which has been restarted 10/28,continue metoprolol for rate control   Hypertension: Currently blood pressure stable. Continue current medicines.  Hypokalemia: Supplemented with potassium.  DVT prophylaxis: LY:[id number]  Code Status: Full    Code Status Orders  (From admission, onward)         Start     Ordered   08/29/19 0425  Full code  Continuous     08/29/19 0424  Code Status History    This patient has a current code status but no historical code status.   Advance Care Planning  Activity    Advance Directive Documentation     Most Recent Value  Type of Advance Directive  Healthcare Power of Attorney, Living will  Pre-existing out of facility DNR order (yellow form or pink MOST form)  -  "MOST" Form in Place?  -     Family Communication: Discussed with patient in detail as well as RN at bedside. Disposition Plan:   Patient remained inpatient for continued IV antibiotics, slow advancing of diet, patient not able to tolerate p.o. intake, still with abdominal pain requiring IV IV pain meds.  She is also short of breath requiring oxygen.  Patient not medically ready for discharge Consults called: None Admission status: Inpatient   Consultants:   GI, general surgery  Procedures:  Ct Abdomen Pelvis Wo Contrast  Result Date: 08/29/2019 CLINICAL DATA:  Acute generalized abdominal pain. Status post cholecystectomy 08/22/2019. Patient not a pain medication. EXAM: CT ABDOMEN AND PELVIS WITHOUT CONTRAST TECHNIQUE: Multidetector CT imaging of the abdomen and pelvis was performed following the standard protocol without IV contrast. COMPARISON:  CT 08/10/2010, no interval CT FINDINGS: Lower chest: Small right and trace left pleural effusion. Bilateral lower lobe atelectasis. Compressive atelectasis in the right middle lobe related to elevated hemidiaphragm. Cardiomegaly. Hepatobiliary: Post cholecystectomy with fluid collection in the gallbladder fossa measuring 6.5 x 2.5 cm. Fluid measures simple density. Tiny dependent hyperdensities in the fluid collection, images 27 and 28 series 2 are nonspecific may represent drop stones. Small volume of simple perihepatic free fluid. Common bile duct is poorly defined, however measures approximately 10 mm. No visualized choledocholithiasis. Suggestion of slight nodular hepatic contours. No evidence of focal hepatic lesion. Pancreas: No ductal dilatation or inflammation. Spleen: Normal in size without focal abnormality. Adrenals/Urinary Tract:  Normal adrenal glands. Extrarenal pelvis configuration of the right greater than left kidney. No renal stones. No perinephric edema. Urinary bladder is physiologically distended. No wall thickening. No bladder stone. Stomach/Bowel: Ingested contrast in the stomach. No gastric wall thickening. Administered enteric contrast reaches mid distal small bowel. No small bowel obstruction. Normal appendix. Moderate stool in the proximal colon. Small volume of stool in the more distal colon. Mild distal descending diverticulosis, prominent diverticulosis in the sigmoid. No evidence of diverticulitis. Vascular/Lymphatic: Mild aortic atherosclerosis. No aneurysm. Definite enlarged lymph nodes in the abdomen or pelvis. Reproductive: Atrophic uterus. No gross adnexal mass. Other: Small volume simple free fluid in the pelvis. Minimal fluid in the right pericolic gutter. No free air. Expected stranding in the subcutaneous tissues port sites. No subcutaneous fluid collection. Musculoskeletal: There are no acute or suspicious osseous abnormalities. Scoliosis and degenerative change in the spine. IMPRESSION: 1. Post recent cholecystectomy with fluid collection in the gallbladder fossa measuring 6.5 x 2.5 cm, may be postoperative seroma or biloma. Tiny dependent hyperdensities within the fluid collection are nonspecific, may represent drop stones or potentially surgical packing. Consider nuclear medicine hepatic biliary scan if there is concern for biloma. 2. Prominent common bile duct at 10 mm, may be normal post cholecystectomy. 3. Additional free fluid about the liver and tracking into the pelvis measures simple fluid density. 4. Small right pleural effusion.  Bibasilar atelectasis. 5. Colonic diverticulosis without diverticulitis. Aortic Atherosclerosis (ICD10-I70.0). Electronically Signed   By: Keith Rake M.D.   On: 08/29/2019 02:36   Dg Chest Port 1 View  Result Date: 09/06/2019 CLINICAL DATA:  Short  of breath EXAM:  PORTABLE CHEST 1 VIEW COMPARISON:  08/28/2019 FINDINGS: Small pleural effusions. Cardiomegaly with vascular congestion and diffuse interstitial and hazy pulmonary edema. No pneumothorax. IMPRESSION: Mild cardiomegaly with vascular congestion and mild pulmonary edema. There are suspected small pleural effusions. Left basilar atelectasis or pneumonia Electronically Signed   By: [name] M.D.   On: 09/06/2019 17:41   Dg Chest Port 1 View  Result Date: 08/28/2019 CLINICAL DATA:  CHF, shortness of breath EXAM: PORTABLE CHEST 1 VIEW COMPARISON:  None. FINDINGS: There is mild cardiomegaly. Pulmonary vascular congestion is seen. There is mildly increased interstitial markings throughout both lungs. There is blunting of the left costophrenic angle which could be due to a trace left pleural effusion. No acute osseous abnormality. IMPRESSION: Mild cardiomegaly and interstitial edema. Probable trace left pleural effusion. Electronically Signed   By: Prudencio Pair M.D.   On: 08/28/2019 22:34   Dg Ercp  Result Date: 09/03/2019 CLINICAL DATA:  78 year old female with a history of sphincterotomy for choledocholithiasis EXAM: ERCP TECHNIQUE: Multiple spot images obtained with the fluoroscopic device and submitted for interpretation post-procedure. FLUOROSCOPY TIME:  Fluoroscopy Time: 4 minutes 29 seconds COMPARISON:  None. FINDINGS: Limited images during ERCP. Initial image demonstrates endoscope projecting over the upper abdomen with a safety wire in position in the extrahepatic biliary ducts and partial opacification with contrast. Subsequently there is evidence retrieval balloon deployment and placement of a plastic biliary stent. Ill-defined contrast between the common bile duct and the pigtail drainage catheter present. Pigtail drainage catheter in place in the upper abdomen. IMPRESSION: Limited images during ERCP demonstrates treatment of choledocholithiasis with deployment of a retrieval balloon and placement of  a plastic biliary stent. Ill-defined contrast between the common bile duct and the pigtail drainage catheter may reflect leakage or refluxed contrast in a cystic duct stump. Please refer to the dictated operative report for full details of intraoperative findings and procedure. Electronically Signed   By: Corrie Mckusick D.O.   On: 09/03/2019 13:11   Ct Image Guided Drainage By Percutaneous Catheter  Result Date: 08/30/2019 INDICATION: 78 year old female with post cholecystectomy bile leak. She presents for CT-guided drain placement. EXAM: CT-guided drain placement MEDICATIONS: The patient is currently admitted to the hospital and receiving intravenous antibiotics. The antibiotics were administered within an appropriate time frame prior to the initiation of the procedure. ANESTHESIA/SEDATION: Fentanyl 100 mcg IV; Versed 4 mg IV, 1 mg Dilaudid Moderate Sedation Time:  34 minutes The patient was continuously monitored during the procedure by the interventional radiology nurse under my direct supervision. COMPLICATIONS: None immediate. PROCEDURE: Informed written consent was obtained from the patient after a thorough discussion of the procedural risks, benefits and alternatives. All questions were addressed. A timeout was performed prior to the initiation of the procedure. A planning axial CT scan was performed. The fluid collection in the gallbladder fossa was successfully identified. A suitable skin entry site was selected and marked. Local anesthesia was attained by infiltration with 1% lidocaine. A small dermatotomy was made. Under intermittent CT guidance, an 18 gauge trocar needle was advanced into the fluid collection. A 0.035 wire was then advanced into the fluid collection. The needle was removed. The soft tissue tract was dilated to 10 [place] and a [place] all-purpose drainage catheter was advanced over the wire and formed in the fluid collection. Aspiration yields several 100 mL of bilious fluid.  The catheter was secured in place with 0 Prolene suture and connected to JP bulb suction. Follow-up  CT imaging demonstrates a well-positioned drainage catheter with significantly decreased fluid in the gallbladder fossa. However, some of the fluid has leaked into the subcutaneous soft tissues along the catheter tubing. IMPRESSION: Successful placement of 10 French drainage catheter into the gallbladder fossa with aspiration of several 100 mL bilious fluid. Electronically Signed   By: Jacqulynn Cadet M.D.   On: 08/30/2019 13:47   Nm Hepato Biliary Leak  Addendum Date: 08/29/2019   ADDENDUM REPORT: 08/29/2019 11:46 ADDENDUM: These results were called by telephone at the time of interpretation on 08/29/2019 at 11:46 am to provider Dr. Marlou Starks, who verbally acknowledged these results. Electronically Signed   By: Zetta Bills M.D.   On: 08/29/2019 11:46   Result Date: 08/29/2019 CLINICAL DATA:  History of suspected bile leak. EXAM: NUCLEAR MEDICINE HEPATOBILIARY IMAGING TECHNIQUE: Sequential images of the abdomen were obtained out to 60 minutes following intravenous administration of radiopharmaceutical. RADIOPHARMACEUTICALS:  5.1 mCi Tc-50m  Choletec IV COMPARISON:  CT study of 08/29/2019 FINDINGS: Prompt uptake of radiotracer into hepatic parenchyma with excretion into the biliary tree is noted. An amorphous collection of radiotracer is demonstrated, enlarging over time, over the right upper quadrant extending into the expected area of gallbladder fossa and porta hepatis. Likely tracking up over the liver as well. IMPRESSION: Nuclear medicine signs of biliary leak. No signs of small bowel activity. A call is out to the referring provider to discuss findings in the above case. Electronically Signed: By: Zetta Bills M.D. On: 08/29/2019 11:39    Antimicrobials:   Zosyn >10/24   Subjective: Feels her breathing is improving as compared to yesterday.  Tolerating diet but appetite is  poor.  Objective: Vitals:   09/07/19 1110 09/07/19 1411 09/07/19 2006 09/08/19 0616  BP:  (!) 112/47 (!) 116/51 (!) 109/51  Pulse:  74 64 83  Resp:  16 19 14   Temp:  (!) 97.4 F (36.3 C) 98.5 F (36.9 C) 98.9 F (37.2 C)  TempSrc:   Oral Oral  SpO2: 96% 97% 99% 92%  Weight:    93.9 kg  Height:        Intake/Output Summary (Last 24 hours) at 09/08/2019 0909 Last data filed at 09/08/2019 0618 Gross per 24 hour  Intake 885 ml  Output 4800 ml  Net -3915 ml   Filed Weights   09/03/19 0351 09/05/19 0500 09/08/19 0616  Weight: 95.6 kg 95 kg 93.9 kg    Examination:  General exam: Alert, awake, oriented x 3 Respiratory system: Crackles at bases. Respiratory effort normal. Cardiovascular system:RRR. No murmurs, rubs, gallops. Gastrointestinal system: Abdomen is nondistended, soft and nontender. No organomegaly or masses felt. Normal bowel sounds heard. Central nervous system: Alert and oriented. No focal neurological deficits. Extremities: Trace edema bilaterally Skin: No rashes, lesions or ulcers Psychiatry: Judgement and insight appear normal. Mood & affect appropriate.     Data Reviewed: I have personally reviewed following labs and imaging studies  CBC: Recent Labs  Lab 09/04/19 0201 09/05/19 0202 09/06/19 0515 09/07/19 0529 09/08/19 0552  WBC 10.7* 12.6* 15.6* 14.5* 11.0*  HGB 11.1* 10.6* 9.7* 10.2* 10.7*  HCT 34.5* 33.1* 29.8* 31.5* 33.1*  MCV 97.5 97.1 97.1 96.0 95.7  PLT 405* 422* 430* 463* 263*   Basic Metabolic Panel: Recent Labs  Lab 09/03/19 0154 09/04/19 0858 09/05/19 0202 09/07/19 0529 09/08/19 0552  NA 137 136 133* 136 137  K 3.7 3.6 4.0 3.3* 3.4*  CL 104 103 104 100 97*  CO2 22 23 20* 26 29  GLUCOSE 95 106* 98 108* 96  BUN 20 22 19 16 19   CREATININE 1.14* 1.15* 0.98 1.01* 1.13*  CALCIUM 8.5* 8.7* 8.5* 8.4* 8.6*  PHOS  --   --   --   --  2.9   GFR: Estimated Creatinine Clearance: 43.1 mL/min (A) (by C-G formula based on SCr of 1.13  mg/dL (H)). Liver Function Tests: Recent Labs  Lab 09/03/19 0154 09/04/19 0858 09/05/19 0202 09/07/19 0529 09/08/19 0552  AST 29 35 38 38  --   ALT 18 23 25 31   --   ALKPHOS 102 103 113 99  --   BILITOT 0.7 0.7 1.0 1.2  --   PROT 6.5 6.4* 6.5 6.2*  --   ALBUMIN 2.5* 2.7* 2.7* 2.6* 2.4*   Recent Labs  Lab 09/04/19 0858 09/08/19 0552  LIPASE 2,732* 92*   No results for input(s): AMMONIA in the last 168 hours. Coagulation Profile: No results for input(s): INR, PROTIME in the last 168 hours. Cardiac Enzymes: No results for input(s): CKTOTAL, CKMB, CKMBINDEX, TROPONINI in the last 168 hours. BNP (last 3 results) No results for input(s): PROBNP in the last 8760 hours. HbA1C: No results for input(s): HGBA1C in the last 72 hours. CBG: No results for input(s): GLUCAP in the last 168 hours. Lipid Profile: No results for input(s): CHOL, HDL, LDLCALC, TRIG, CHOLHDL, LDLDIRECT in the last 72 hours. Thyroid Function Tests: No results for input(s): TSH, T4TOTAL, FREET4, T3FREE, THYROIDAB in the last 72 hours. Anemia Panel: No results for input(s): VITAMINB12, FOLATE, FERRITIN, TIBC, IRON, RETICCTPCT in the last 72 hours. Sepsis Labs: No results for input(s): PROCALCITON, LATICACIDVEN in the last 168 hours.  Recent Results (from the past 240 hour(s))  MRSA PCR Screening     Status: None   Collection Time: 08/29/19  5:00 PM   Specimen: Nasal Mucosa; Nasopharyngeal  Result Value Ref Range Status   MRSA by PCR NEGATIVE NEGATIVE Final    Comment:        The GeneXpert MRSA Assay (FDA approved for NASAL specimens only), is one component of a comprehensive MRSA colonization surveillance program. It is not intended to diagnose MRSA infection nor to guide or monitor treatment for MRSA infections. Performed at [facility], [place] [address] [place], [place] [address]   Culture, blood (routine x 2)     Status: None   Collection Time: 08/30/19  8:10 AM   Specimen:  BLOOD  Result Value Ref Range Status   Specimen Description   Final    BLOOD LEFT HAND Performed at [facility] [address] [place], [place] [address]    Special Requests   Final    BOTTLES DRAWN AEROBIC AND ANAEROBIC Blood Culture adequate volume Performed at [facility] [address], [place], [place] [address]    Culture   Final    NO GROWTH 5 DAYS Performed at [facility], [place] [address], [place], [place] [address]    Report Status 09/04/2019 FINAL  Final  Culture, blood (routine x 2)     Status: None   Collection Time: 08/30/19  8:21 AM   Specimen: BLOOD  Result Value Ref Range Status   Specimen Description   Final    BLOOD RIGHT HAND Performed at [facility] [address] [place], [place] [address]    Special Requests   Final    BOTTLES DRAWN AEROBIC ONLY Blood Culture results may not be optimal due to an  inadequate volume of blood received in culture bottles Performed at [facility] [address] [place], [place] [address]    Culture   Final    NO GROWTH 5 DAYS Performed at [facility], [place] [address], [place], [place] [address]    Report Status 09/04/2019 FINAL  Final  Body fluid culture     Status: None (Preliminary result)   Collection Time: 09/06/19  2:58 PM   Specimen: BILE; Body Fluid  Result Value Ref Range Status   Specimen Description   Final    BILE Performed at [facility] [address] [place], [place] [address]    Special Requests   Final    Normal Performed at [facility], [place] [address] [place], [place] [address]    Gram Stain   Final    NO WBC SEEN ABUNDANT YEAST Performed at [facility], [place] [address], [place], [place] [address]    Culture ABUNDANT YEAST  Final   Report Status PENDING  Incomplete         Radiology Studies: Dg Chest Port 1 View  Result Date: 09/06/2019 CLINICAL  DATA:  Short of breath EXAM: PORTABLE CHEST 1 VIEW COMPARISON:  08/28/2019 FINDINGS: Small pleural effusions. Cardiomegaly with vascular congestion and diffuse interstitial and hazy pulmonary edema. No pneumothorax. IMPRESSION: Mild cardiomegaly with vascular congestion and mild pulmonary edema. There are suspected small pleural effusions. Left basilar atelectasis or pneumonia Electronically Signed   By: [name] M.D.   On: 09/06/2019 17:41        Scheduled Meds: . allopurinol  100 mg Oral Daily  . apixaban  5 mg Oral BID  . diazepam  2.5 mg Oral QHS  . furosemide  20 mg Oral Daily  . mouth rinse  15 mL Mouth Rinse BID  . metoprolol succinate  100 mg Oral Daily  . pantoprazole  40 mg Oral Daily  . sodium chloride flush  5 mL Intracatheter Q8H   Continuous Infusions: . sodium chloride 25 mL/hr at 09/06/19 1457  . piperacillin-tazobactam (ZOSYN)  IV 3.375 g (09/07/19 2334)     LOS: 10 days    Time spent: 41 min    Mir Marry Guan, MD [facility]  If 7PM-7AM, please contact night-coverage  09/08/2019, 9:09 AM

## 2019-09-09 DIAGNOSIS — K839 Disease of biliary tract, unspecified: Secondary | ICD-10-CM

## 2019-09-09 LAB — CBC
HCT: 31.2 % — ABNORMAL LOW (ref 36.0–46.0)
Hemoglobin: 10 g/dL — ABNORMAL LOW (ref 12.0–15.0)
MCH: 31.3 pg (ref 26.0–34.0)
MCHC: 32.1 g/dL (ref 30.0–36.0)
MCV: 97.5 fL (ref 80.0–100.0)
Platelets: 582 10*3/uL — ABNORMAL HIGH (ref 150–400)
RBC: 3.2 MIL/uL — ABNORMAL LOW (ref 3.87–5.11)
RDW: 13.6 % (ref 11.5–15.5)
WBC: 9.6 10*3/uL (ref 4.0–10.5)
nRBC: 0 % (ref 0.0–0.2)

## 2019-09-09 NOTE — Progress Notes (Signed)
[facility]  PROGRESS NOTE  Kristen [name] TMH:[id number] DOB: Jun 13, 1941 DOA: 08/28/2019 PCP: [name], MD   Brief HPI:   78 year old female with a history of anxiety, atrial fibrillation on anticoagulation with Eliquis, hypertension, CKD uncertain baseline who came to ED with abdominal pain.  Patient recently underwent laparoscopic cholecystectomy on 08/22/2019 and was recovering uneventfully back at home but started developing cervical pain of right upper quadrant with radiation towards right shoulder blade in the ED CT abdomen pelvis revealed fluid collection in the gallbladder fossa.  General surgery was consulted HIDA scan was done which confirmed biliary leak and no activity in the small bowel.  Patient underwent gallbladder fossa drain placement per IR and ERCP with biliary duct stone removal and stent placement per GI.    Subjective   Patient seen and examined, denies abdominal pain.  Gallbladder fossa drain in place, drainage output has gone down.   Assessment/Plan:     1. Right upper quadrant pain/biliary leak-HIDA scan suggested biliary leak after patient presents with right upper quadrant pain.  CT showed fluid collection in the gallbladder fossa.  Biliary drain placement per IR yielded significant amount of fluid.  ERCP showed choledocholithiasis, underwent removal of stone and stent placement.  GI has signed off.  Patient was empirically started on IV Zosyn, and has completed 10 days of IV Zosyn.  Will discontinue antibiotics at this time.  2. Post ERCP pancreatitis-patient developed post ERCP pancreatitis with lipase to 732 which improved to 92.  Her diet has been advanced to solid food which she has been tolerating.    3. Acute respiratory failure with hypoxia-patient developed acute on chronic diastolic CHF exacerbation, secondary to fluid overload.  Echocardiogram showed EF 60 to 65% improved with IV Lasix.  Currently she is on Lasix 20 mg p.o. daily.  She is  on 1 L of oxygen via nasal cannula.  Continue to wean off oxygen.  Follow BMP in a.m.  4. A. fib with RVR- CHA2DS2VASc score of at least 5 patient went into A. fib in the ED.  She was started on Cardizem drip which has been stopped.  She is on Eliquis for anticoagulation.  Continue metoprolol for rate control.  5. Hypertension-blood pressure is stable.  6. Hypokalemia-potassium was 3.4 yesterday, was replaced.  Will repeat BMP in a.m.   CBC: Recent Labs  Lab 09/05/19 0202 09/06/19 0515 09/07/19 0529 09/08/19 0552 09/09/19 0526  WBC 12.6* 15.6* 14.5* 11.0* 9.6  HGB 10.6* 9.7* 10.2* 10.7* 10.0*  HCT 33.1* 29.8* 31.5* 33.1* 31.2*  MCV 97.1 97.1 96.0 95.7 97.5  PLT 422* 430* 463* 552* 582*    Basic Metabolic Panel: Recent Labs  Lab 09/03/19 0154 09/04/19 0858 09/05/19 0202 09/07/19 0529 09/08/19 0552  NA 137 136 133* 136 137  K 3.7 3.6 4.0 3.3* 3.4*  CL 104 103 104 100 97*  CO2 22 23 20* 26 29  GLUCOSE 95 106* 98 108* 96  BUN 20 22 19 16 19   CREATININE 1.14* 1.15* 0.98 1.01* 1.13*  CALCIUM 8.5* 8.7* 8.5* 8.4* 8.6*  PHOS  --   --   --   --  2.9     DVT prophylaxis: Apixaban  Code Status: Full code  Family Communication: No family at bedside  Disposition Plan: likely home when medically ready for discharge     BMI  Estimated body mass index is 37.38 kg/m as calculated from the following:   Height as of this encounter: 5\' 2"  (1.575 m).  Weight as of this encounter: 92.7 kg.  Scheduled medications:  . allopurinol  100 mg Oral Daily  . apixaban  5 mg Oral BID  . diazepam  2.5 mg Oral QHS  . furosemide  20 mg Oral Daily  . mouth rinse  15 mL Mouth Rinse BID  . metoprolol succinate  100 mg Oral Daily  . pantoprazole  40 mg Oral Daily  . sodium chloride flush  5 mL Intracatheter Q8H    Consultants:  Gastroenterology  General surgery  IR  Procedures:  Gallbladder fossa drain placement per IR,  ERCP with sphincterotomy and biliary stent  placement   Antibiotics:   Anti-infectives (From admission, onward)   Start     Dose/Rate Route Frequency Ordered Stop   08/31/19 1200  piperacillin-tazobactam (ZOSYN) IVPB 3.375 g  Status:  Discontinued     3.375 g 12.5 mL/hr over 240 Minutes Intravenous Every 8 hours 08/31/19 0743 09/08/19 1456   08/30/19 0400  piperacillin-tazobactam (ZOSYN) IVPB 2.25 g  Status:  Discontinued     2.25 g 100 mL/hr over 30 Minutes Intravenous Every 8 hours 08/29/19 1747 08/31/19 0743   08/29/19 1800  piperacillin-tazobactam (ZOSYN) IVPB 3.375 g     3.375 g 100 mL/hr over 30 Minutes Intravenous  Once 08/29/19 1745 08/29/19 2043       Objective   Vitals:   09/08/19 2012 09/09/19 0522 09/09/19 0949 09/09/19 1306  BP: (!) 115/56 117/64  110/60  Pulse: 77 (!) 54 62 70  Resp: 16 16  16   Temp: 98.4 F (36.9 C) 97.7 F (36.5 C)  98.2 F (36.8 C)  TempSrc: Oral Oral    SpO2: 95% 99%  95%  Weight:  92.7 kg    Height:        Intake/Output Summary (Last 24 hours) at 09/09/2019 1640 Last data filed at 09/09/2019 1319 Gross per 24 hour  Intake 499.38 ml  Output 800 ml  Net -300.62 ml   Filed Weights   09/05/19 0500 09/08/19 0616 09/09/19 0522  Weight: 95 kg 93.9 kg 92.7 kg     Physical Examination:    General: Appears in no acute distress  Cardiovascular: S1-S2, regular, no murmur auscultated  Respiratory: Clear to auscultation bilaterally, no wheezing or crackles  Abdomen: Abdomen is soft, nontender, no organomegaly, biliary drain in place  Extremities: No edema in the lower extremities  Neurologic: Alert, oriented x3, intact insight and judgment, no focal deficit     Data Reviewed: I have personally reviewed following labs and imaging studies   Recent Results (from the past 240 hour(s))  Body fluid culture     Status: None   Collection Time: 09/06/19  2:58 PM   Specimen: BILE; Body Fluid  Result Value Ref Range Status   Specimen Description   Final    BILE Performed  at [facility], [address] [place], [place] [address]    Special Requests   Final    Normal Performed at [facility], [place] [address] [place], [place] [address]    Gram Stain   Final    NO WBC SEEN ABUNDANT YEAST Performed at [facility], [place] [address], [place], [place] [address]    Culture ABUNDANT CANDIDA PARAPSILOSIS  Final   Report Status 09/08/2019 FINAL  Final     Liver Function Tests: Recent Labs  Lab 09/03/19 0154 09/04/19 0858 09/05/19 0202 09/07/19 0529 09/08/19 0552  AST 29 35 38 38  --  ALT 18 23 25 31   --   ALKPHOS 102 103 113 99  --   BILITOT 0.7 0.7 1.0 1.2  --   PROT 6.5 6.4* 6.5 6.2*  --   ALBUMIN 2.5* 2.7* 2.7* 2.6* 2.4*   Recent Labs  Lab 09/04/19 0858 09/08/19 0552  LIPASE 2,732* 92*   No results for input(s): AMMONIA in the last 168 hours.  Cardiac Enzymes: No results for input(s): CKTOTAL, CKMB, CKMBINDEX, TROPONINI in the last 168 hours. BNP (last 3 results) Recent Labs    08/28/19 2158  BNP 310.5*    ProBNP (last 3 results) No results for input(s): PROBNP in the last 8760 hours.    Studies: No results found.   Admission status: Inpatient: Based on patients clinical presentation and evaluation of above clinical data, I have made determination that patient meets Inpatient criteria at this time.   [facility] Pager [phone]. If 7PM-7AM, please contact night-coverage at [email or web address], Office  [phone]  password [email or web address]  09/09/2019, 4:40 PM  LOS: 11 days

## 2019-09-09 NOTE — Progress Notes (Signed)
Physical Therapy Treatment Patient Details Name: Kristen [name] MRN: [id number] DOB: [birth year] Today's Date: 09/09/2019    History of Present Illness Pt is s/p laparoscopic cholecystectomy on 08/22/2019 and then had biliary leak and no activity in the small bowel. Pt s/p biliary drain 10/23 and s/p ERCP 10/27. PMH: A fib, CHF, mixed anxiety and depressive disorder    PT Comments    Pt was agreeable to working with PT. She reported that she had not been OOB in the last 2 days. She also reported bil knee pain, R worse than L. She was able to stand and take a few steps in the room using a RW. Pt exhibits general weakness, decreased activity tolerance, and impaired gait and balance. Continue to recommend SNF for ST rehab.     Follow Up Recommendations  SNF     Equipment Recommendations  None recommended by PT    Recommendations for Other Services       Precautions / Restrictions Precautions Precautions: Fall Precaution Comments: JP drain Restrictions Weight Bearing Restrictions: No    Mobility  Bed Mobility Overal bed mobility: Needs Assistance Bed Mobility: Supine to Sit     Supine to sit: Min guard;HOB elevated     General bed mobility comments: Increased time and cues for technique. Mod reliance on bedrail.  Transfers Overall transfer level: Needs assistance Equipment used: Rolling walker (2 wheeled) Transfers: Sit to/from [facility] Sit to Stand: Min assist Stand pivot transfers: Min assist;From elevated surface       General transfer comment: Increased time. Assist to rise, stabilize, control descent. VCs safety, hand placement. Stand pivot, bed to recliner, using RW. Very short antalgic steps.  Ambulation/Gait Ambulation/Gait assistance: Min assist;+2 safety/equipment Gait Distance (Feet): 3 Feet Assistive device: Rolling walker (2 wheeled) Gait Pattern/deviations: Step-to pattern;Antalgic;Trunk flexed     General Gait Details: Very  slow, mod antalgic gait pattern. Assist to stabilize pt and maneuver RW. Pt only able to take a few steps in room.   Stairs             Wheelchair Mobility    Modified Rankin (Stroke Patients Only)       Balance Overall balance assessment: Needs assistance         Standing balance support: Bilateral upper extremity supported Standing balance-Leahy Scale: Poor                              Cognition Arousal/Alertness: Awake/alert Behavior During Therapy: WFL for tasks assessed/performed Overall Cognitive Status: Within Functional Limits for tasks assessed                                        Exercises      General Comments        Pertinent Vitals/Pain Pain Assessment: Faces Faces Pain Scale: Hurts little more Pain Location: back, R knee, drain site Pain Descriptors / Indicators: Discomfort;Sore;Aching Pain Intervention(s): Monitored during session;Repositioned    Home Living                      Prior Function            PT Goals (current goals can now be found in the care plan section) Progress towards PT goals: Progressing toward goals    Frequency    Min 2X/week  PT Plan Current plan remains appropriate    Co-evaluation              AM-PAC PT "6 Clicks" Mobility   Outcome Measure  Help needed turning from your back to your side while in a flat bed without using bedrails?: A Little Help needed moving from lying on your back to sitting on the side of a flat bed without using bedrails?: A Little Help needed moving to and from a bed to a chair (including a wheelchair)?: A Little Help needed standing up from a chair using your arms (e.g., wheelchair or bedside chair)?: A Little Help needed to walk in hospital room?: A Lot Help needed climbing 3-5 steps with a railing? : Total 6 Click Score: 15    End of Session   Activity Tolerance: Patient limited by fatigue;Patient limited by  pain Patient left: in chair;with call bell/phone within reach;with chair alarm set   PT Visit Diagnosis: Muscle weakness (generalized) (M62.81);Pain;Difficulty in walking, not elsewhere classified (R26.2) Pain - Right/Left: Right Pain - part of body: Knee     Time: 3007-6226 PT Time Calculation (min) (ACUTE ONLY): 41 min  Charges:  $Therapeutic Activity: 23-37 mins                        Weston Anna, PT Acute Rehabilitation Services Pager: [phone] Office: [phone]

## 2019-09-09 NOTE — NC FL2 (Signed)
[place] LEVEL OF CARE SCREENING TOOL     IDENTIFICATION  Patient Name: Kristen [name] Birthdate: 1941/02/02 Sex: female Admission Date (Current Location): 08/28/2019  [facility] and [place] Number:  Herbalist and Address:  [facility],  [place] [address], [place]      Provider Number: [id number]  Attending Physician Name and Address:  Oswald Hillock, MD  Relative Name and Phone Number:       Current Level of Care: Hospital Recommended Level of Care: [place] Prior Approval Number:    Date Approved/Denied:   PASRR Number: pending  Discharge Plan: SNF    Current Diagnoses: Patient Active Problem List   Diagnosis Date Noted  . Acute renal failure (ARF) ([place]) 08/29/2019  . Intraabdominal fluid collection 08/29/2019  . A-fib ([place]) 08/29/2019  . Varicose veins of leg with edema, right 05/21/2019  . Ankle swelling, right 05/21/2019  . Bunion of great toe of right foot 05/21/2019  . Osteoarthritis 03/13/2018  . Gait abnormality 03/13/2018  . Skin lesion 02/13/2018  . 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 Abnormal Pap smear of cervix 02/08/2016  . Chronic kidney disease 02/08/2016  . Urinary frequency 02/08/2016  . History of diverticulitis 01/22/2016  . Anxiety state 10/17/2014  . Long-term (current) use of anticoagulants 06/10/2014  . Chronic venous insufficiency 06/10/2014  . Obesity (BMI 30-39.9)   . Acute on chronic diastolic CHF (congestive heart failure) ([place])   . Candidal intertrigo 06/09/2014  . Nocturnal leg cramps 04/13/2012  . Insomnia 01/10/2011  . Permanent atrial fibrillation ([place])   . OSTEOPENIA 01/31/2008  . Hypertensive heart disease     Orientation RESPIRATION BLADDER Height & Weight     Self, Time, Situation, Place  Normal Incontinent Weight: 204 lb 5.9 oz (92.7 kg) Height:  5\' 2"  (157.5 cm)  BEHAVIORAL SYMPTOMS/MOOD NEUROLOGICAL BOWEL NUTRITION STATUS      Continent Diet(soft diet)  AMBULATORY STATUS COMMUNICATION OF NEEDS Skin   Extensive Assist Verbally Surgical wounds(s/p biliary drain and stent placement)                       Personal Care Assistance Level of Assistance  Bathing, Feeding, Dressing Bathing Assistance: Maximum assistance Feeding assistance: Independent Dressing Assistance: Limited assistance     Functional Limitations Info  Sight, Hearing, Speech Sight Info: Adequate Hearing Info: Adequate Speech Info: Adequate    SPECIAL CARE FACTORS FREQUENCY  PT (By licensed PT), OT (By licensed OT)     PT Frequency: 5x OT Frequency: 5x            Contractures Contractures Info: Not present    Additional Factors Info  Code Status, Allergies Code Status Info: full code Allergies Info: Flecainide Acetate           Current Medications (09/09/2019):  This is the current hospital active medication list Current Facility-Administered Medications  Medication Dose Route Frequency Provider Last Rate Last Dose  . 0.9 %  sodium chloride infusion   Intravenous Continuous Brahmbhatt, Parag, MD 25 mL/hr at 09/06/19 1457    . acetaminophen (TYLENOL) tablet 650 mg  650 mg Oral Q6H PRN Clarene Essex, MD   650 mg at 09/09/19 7510   Or  . acetaminophen (TYLENOL) suppository 650 mg  650 mg Rectal Q6H PRN Clarene Essex, MD      . allopurinol (ZYLOPRIM) tablet 100 mg  100 mg Oral Daily Clarene Essex, MD   100  mg at 09/09/19 0951  . apixaban (ELIQUIS) tablet 5 mg  5 mg Oral BID Marcell Anger, MD   5 mg at 09/09/19 0949  . diazepam (VALIUM) tablet 2.5 mg  2.5 mg Oral QHS Clarene Essex, MD   2.5 mg at 09/08/19 2104  . fentaNYL (SUBLIMAZE) injection 25-50 mcg  25-50 mcg Intravenous Q2H PRN Clarene Essex, MD   50 mcg at 09/09/19 0106  . furosemide (LASIX) tablet 20 mg  20 mg Oral Daily Kathie Dike, MD   20 mg at 09/09/19 0949  . ipratropium-albuterol (DUONEB) 0.5-2.5 (3) MG/3ML nebulizer solution 3 mL  3 mL Nebulization Q6H  PRN Marcell Anger, MD   3 mL at 09/05/19 1853  . MEDLINE mouth rinse  15 mL Mouth Rinse BID Clarene Essex, MD   15 mL at 09/09/19 0954  . metoprolol succinate (TOPROL-XL) 24 hr tablet 100 mg  100 mg Oral Daily Clarene Essex, MD   100 mg at 09/09/19 0949  . ondansetron (ZOFRAN) injection 4 mg  4 mg Intravenous Q6H PRN Clarene Essex, MD   4 mg at 09/09/19 0106  . pantoprazole (PROTONIX) EC tablet 40 mg  40 mg Oral Daily Brahmbhatt, Parag, MD   40 mg at 09/09/19 0949  . polyvinyl alcohol (LIQUIFILM TEARS) 1.4 % ophthalmic solution 1 drop  1 drop Both Eyes PRN Clarene Essex, MD   1 drop at 09/05/19 1806  . sodium chloride flush (NS) 0.9 % injection 5 mL  5 mL Intracatheter Q8H Clarene Essex, MD   5 mL at 09/09/19 4709     Discharge Medications: Please see discharge summary for a list of discharge medications.  Relevant Imaging Results:  Relevant Lab Results:   Additional Information SS# [id number]  Nila Nephew, LCSW

## 2019-09-09 NOTE — Progress Notes (Signed)
Referring Physician(s): Outlaw,W  Supervising Physician: Sandi Mariscal  Patient Status:  [facility] - In-pt  Chief Complaint: Abdominal pain   Subjective: Pt eating dinner; appears comfortable at present; denies N/V or worsening abd pain   Allergies: Flecainide acetate  Medications: Prior to Admission medications   Medication Sig Start Date End Date Taking? Authorizing Provider  alendronate (FOSAMAX) 10 MG tablet TAKE (1) TABLET BY MOUTH PER WEEK TAKE ON EMPTY STOMACH WITH FULL GLASS OF WATER Patient taking differently: Take 10 mg by mouth daily before breakfast. TAKE (1) TABLET BY MOUTH PER WEEK TAKE ON EMPTY STOMACH WITH FULL GLASS OF WATER 05/02/19  Yes Kristen Dawn, MD  allopurinol (ZYLOPRIM) 100 MG tablet TAKE 1 TABLET BY MOUTH EVERY DAY Patient taking differently: Take 100 mg by mouth daily.  06/24/19  Yes Kristen Minion, MD  calcium carbonate (OSCAL) 1500 (600 Ca) MG TABS tablet Take 1,500 mg by mouth daily.    Yes [provider]  calcium carbonate (TUMS - DOSED IN MG ELEMENTAL CALCIUM) 500 MG chewable tablet Chew 1 tablet by mouth as needed for indigestion or heartburn.   Yes [provider]  cholecalciferol (VITAMIN D3) 25 MCG (1000 UT) tablet Take 1,000 Units by mouth daily.   Yes [provider]  CINNAMON PO Take 1 tablet by mouth daily.    Yes [provider]  clindamycin (CLINDAGEL) 1 % gel APPLY TO AFFECTED AREA TWICE A DAY Patient taking differently: Apply 1 application topically 2 (two) times daily.  04/13/18  Yes Kristen Dawn, MD  colchicine 0.6 MG tablet Take 1 tablet (0.6 mg total) by mouth 2 (two) times daily. Take twice daily until pain resolves and then take as needed for gout flare. Patient taking differently: Take 0.6 mg by mouth 2 (two) times daily as needed. Take twice daily until pain resolves and then take as needed for gout flare. 06/15/18  Yes Kristen Minion, MD  [employer] ANTI-FUNGAL 2 % powder APPLY TO AFFECTED AREA TWICE A  DAY AFTER SKIN RESOLVES FROM KETOCONAZOLE CREAM Patient taking differently: as needed.  10/17/14  Yes Kristen Brand, MD  diazepam (VALIUM) 5 MG tablet TAKE 1/2 TABLET (2.5 MG TOTAL) BY MOUTH AT BEDTIME. Patient taking differently: Take 2.5 mg by mouth at bedtime.  07/08/19  Yes Kristen Dawn, MD  doxycycline (VIBRAMYCIN) 50 MG capsule TAKE 2 CAPSULES BY MOUTH 2 (TWO) TIMES DAILY. Patient taking differently: Take 100 mg by mouth See admin instructions. Twice daily As directed for deer tick's (allergic reaction) 04/25/18  Yes Kristen Dawn, MD  ELIQUIS 5 MG TABS tablet TAKE 1 TABLET BY MOUTH TWICE A DAY Patient taking differently: Take 5 mg by mouth 2 (two) times daily.  05/23/19  Yes Kristen Liter, MD  Flax Oil-Fish Oil-Borage Oil (FISH-FLAX-BORAGE) CAPS Take 1 capsule by mouth daily.   Yes [provider]  furosemide (LASIX) 20 MG tablet Take 1 tablet (20 mg total) by mouth daily. Patient taking differently: Take 20 mg by mouth daily.  08/19/19  Yes Kristen Liter, MD  glucosamine-chondroitin (GLUCOSAMINE-CHONDROITIN DS) 500-400 MG tablet Take 1 tablet by mouth daily.    Yes [provider]  HYDROcodone-acetaminophen (NORCO/VICODIN) 5-325 MG tablet Take 1 tablet by mouth every 6 (six) hours as needed for moderate pain. 08/22/19  Yes [name], [name], MD  ibuprofen (ADVIL) 800 MG tablet Take 1 tablet (800 mg total) by mouth every 8 (eight) hours as needed. 08/22/19  Yes [name], [name], MD  lisinopril-hydrochlorothiazide (ZESTORETIC) 20-25 MG tablet Take 1 tablet by mouth daily. Patient taking differently: Take 1 tablet by mouth daily.  03/08/19  Yes Kristen Dawn, MD  loratadine (CLARITIN) 10 MG tablet Take 1 tablet (10 mg total) by mouth daily. 07/08/19  Yes Kristen Dawn, MD  metoprolol succinate (TOPROL-XL) 100 MG 24 hr tablet Take 1 tablet (100 mg total) by mouth daily. Take with or immediately following a meal. Patient taking differently: Take 100 mg  by mouth daily. Take with or immediately following a meal. 01/29/19 08/28/19 Yes Kristen Liter, MD  Multiple Vitamins-Minerals (EMERGEN-C IMMUNE) PACK Take 1 tablet by mouth daily.    Yes [provider]  Multiple Vitamins-Minerals (MULTIVITAMIN WOMEN 50+) TABS Take 1 tablet by mouth daily.    Yes [provider]  mupirocin ointment (BACTROBAN) 2 % APPLY TOPICALLY TO AFFECTED AREA TWICE DAILY Patient taking differently: as needed.  02/20/19  Yes Kristen Dawn, MD  Nutritional Supplements (ECHINACEA/GOLDEN SEAL PO) Take 1 tablet by mouth daily.    Yes [provider]  polycarbophil (FIBERCON) 625 MG tablet Take 625 mg by mouth daily.   Yes [provider]  potassium chloride (KLOR-CON) 20 MEQ packet MIX 1 PACKET IN LIQUID AND TAKE BY MOUTH DAILY AS DIRECTED Patient taking differently: Take 20 mEq by mouth daily. MIX 1 PACKET IN LIQUID AND TAKE BY MOUTH DAILY AS DIRECTED 02/20/19  Yes Kristen Dawn, MD  Probiotic Product (PROBIOTIC-10 PO) Take 1 tablet by mouth daily.    Yes [provider]  TURMERIC PO Take 1 tablet by mouth daily.    Yes [provider]     Vital Signs: BP 110/60 (BP Location: Left Arm)   Pulse 70   Temp 98.2 F (36.8 C)   Resp 16   Ht 5\' 2"  (1.575 m)   Wt 204 lb 5.9 oz (92.7 kg)   SpO2 95%   BMI 37.38 kg/m   Physical Exam awake/alert; GB fossa drain intact, output about 10 cc bilious fluid; dressing clean and dry, mildly tender  Imaging: Dg Chest Port 1 View  Result Date: 09/06/2019 CLINICAL DATA:  Short of breath EXAM: PORTABLE CHEST 1 VIEW COMPARISON:  08/28/2019 FINDINGS: Small pleural effusions. Cardiomegaly with vascular congestion and diffuse interstitial and hazy pulmonary edema. No pneumothorax. IMPRESSION: Mild cardiomegaly with vascular congestion and mild pulmonary edema. There are suspected small pleural effusions. Left basilar atelectasis or pneumonia Electronically Signed   By: Kristen [name]  M.D.   On: 09/06/2019 17:41    Labs:  CBC: Recent Labs    09/06/19 0515 09/07/19 0529 09/08/19 0552 09/09/19 0526  WBC 15.6* 14.5* 11.0* 9.6  HGB 9.7* 10.2* 10.7* 10.0*  HCT 29.8* 31.5* 33.1* 31.2*  PLT 430* 463* 552* 582*    COAGS: Recent Labs    08/29/19 1805 08/31/19 1226  INR 1.1 1.1  APTT  --  32    BMP: Recent Labs    09/04/19 0858 09/05/19 0202 09/07/19 0529 09/08/19 0552  NA 136 133* 136 137  K 3.6 4.0 3.3* 3.4*  CL 103 104 100 97*  CO2 23 20* 26 29  GLUCOSE 106* 98 108* 96  BUN 22 19 16 19   CALCIUM 8.7* 8.5* 8.4* 8.6*  CREATININE 1.15* 0.98 1.01* 1.13*  GFRNONAA 45* 55* 53* 46*  GFRAA 52* >60 >60 54*    LIVER FUNCTION TESTS: Recent Labs    09/03/19 0154 09/04/19 0858 09/05/19 0202 09/07/19 0529 09/08/19 0552  BILITOT 0.7 0.7 1.0 1.2  --  AST 29 35 38 38  --   ALT 18 23 25 31   --   ALKPHOS 102 103 113 99  --   PROT 6.5 6.4* 6.5 6.2*  --   ALBUMIN 2.5* 2.7* 2.7* 2.6* 2.4*    Assessment and Plan: Pt s/p lap chole 10/15; now with GB fossa fluid collection/biloma, s/p drain placement 10/23; s/p ERCP with removal of CBD stone/ CBD stent 10/27; afebrile; WBC nl; bile cx- candida- consider antifungal; will plan f/u CT tomorrow   Electronically Signed: D. Rowe Robert, PA-C 09/09/2019, 5:06 PM   I spent a total of 15 minutes at the the patient's bedside AND on the patient's hospital floor or unit, greater than 50% of which was counseling/coordinating care for gallbladder fossa drain    Patient ID: Kristen [name], female   DOB: [birth year], 78 y.o.   MRN: [id number]

## 2019-09-09 NOTE — TOC Progression Note (Signed)
Transition of Care [facility]) - Progression Note    Patient Details  Name: Kristen [name] MRN: [id number] Date of Birth: 04-01-1941  Transition of Care [facility]) CM/SW [place], [place] Phone Number: [phone] 09/09/2019, 11:47 AM  Clinical Narrative:   Followed up with pt today re: SNF recommendation. She feels SNF would benefit her (had husband go for short term rehab in the past and is familiar with goals) and requested referrals.  Pasrr went to manual review, will follow up for approval. Will initiate insurance authorization once facility selected.  Pt expressed being very concerned about the bills she has due on 09/12/2019 (see initial TOC note). CSW validated her concern and encouraged her to follow up with resources given and attempted to problem solve, however pt was noted to be circumstantial and CSW unable to gather root of issue with bills. 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    Expected Discharge Plan: Skilled Nursing Facility Barriers to Discharge: Ship broker, [place] Forensic scientist)  Expected Discharge Plan and Services Expected Discharge Plan: [place] In-house Referral: Clinical Social Work     Living arrangements for the past 2 months: Mobile Home                                       Social Determinants of Health (SDOH) Interventions    Readmission Risk Interventions No flowsheet data found.

## 2019-09-09 NOTE — Care Management Important Message (Signed)
Important Message  Patient Details IM Letter given to Sharren Bridge SW to present to the Patient Name: Kristen [name] MRN: [id number] Date of Birth: 07/10/1941   Medicare Important Message Given:  Yes     [name], [name] 09/09/2019, 10:50 AM

## 2019-09-10 ENCOUNTER — Inpatient Hospital Stay (HOSPITAL_COMMUNITY): Payer: Medicare Other

## 2019-09-10 HISTORY — PX: IR SINUS/FIST TUBE CHK-NON GI: IMG673

## 2019-09-10 LAB — BASIC METABOLIC PANEL
Anion gap: 10 (ref 5–15)
BUN: 19 mg/dL (ref 8–23)
CO2: 27 mmol/L (ref 22–32)
Calcium: 9 mg/dL (ref 8.9–10.3)
Chloride: 100 mmol/L (ref 98–111)
Creatinine, Ser: 0.82 mg/dL (ref 0.44–1.00)
GFR calc Af Amer: >60 mL/min (ref >60)
GFR calc non Af Amer: >60 mL/min (ref >60)
Glucose, Bld: 95 mg/dL (ref 70–99)
Potassium: 3.4 mmol/L — ABNORMAL LOW (ref 3.5–5.1)
Sodium: 137 mmol/L (ref 135–145)

## 2019-09-10 LAB — CBC
HCT: 31.1 % — ABNORMAL LOW (ref 36.0–46.0)
Hemoglobin: 10.1 g/dL — ABNORMAL LOW (ref 12.0–15.0)
MCH: 31.3 pg (ref 26.0–34.0)
MCHC: 32.5 g/dL (ref 30.0–36.0)
MCV: 96.3 fL (ref 80.0–100.0)
Platelets: 630 10*3/uL — ABNORMAL HIGH (ref 150–400)
RBC: 3.23 MIL/uL — ABNORMAL LOW (ref 3.87–5.11)
RDW: 13.6 % (ref 11.5–15.5)
WBC: 8.7 10*3/uL (ref 4.0–10.5)
nRBC: 0 % (ref 0.0–0.2)

## 2019-09-10 MED ORDER — ENSURE ENLIVE PO LIQD
237.0000 mL | Freq: Two times a day (BID) | ORAL | Status: DC
  Administered 2019-09-10 – 2019-09-17 (×10): 237 mL via ORAL

## 2019-09-10 MED ORDER — ALUM & MAG HYDROXIDE-SIMETH 200-200-20 MG/5ML PO SUSP
30.0000 mL | Freq: Four times a day (QID) | ORAL | Status: DC | PRN
  Administered 2019-09-10: 30 mL via ORAL
  Filled 2019-09-10: qty 30

## 2019-09-10 MED ORDER — POTASSIUM CHLORIDE 20 MEQ PO PACK
20.0000 meq | PACK | Freq: Every day | ORAL | Status: DC
  Administered 2019-09-10 – 2019-09-17 (×8): 20 meq via ORAL
  Filled 2019-09-10 (×8): qty 1

## 2019-09-10 MED ORDER — POTASSIUM CHLORIDE CRYS ER 20 MEQ PO TBCR: 40.0000 meq | EXTENDED_RELEASE_TABLET | Freq: Once | ORAL | Status: DC

## 2019-09-10 MED ORDER — IOHEXOL 300 MG/ML  SOLN
50.0000 mL | Freq: Once | INTRAMUSCULAR | Status: AC | PRN
  Administered 2019-09-10: 10 mL

## 2019-09-10 MED ORDER — FLUCONAZOLE 100MG IVPB
100.0000 mg | INTRAVENOUS | Status: DC
  Administered 2019-09-10 – 2019-09-14 (×5): 100 mg via INTRAVENOUS
  Filled 2019-09-10 (×5): qty 50

## 2019-09-10 NOTE — Progress Notes (Signed)
[facility]  PROGRESS NOTE  Kristen [name] DPO:[id number] DOB: [birth year] DOA: 08/28/2019 PCP: [name], MD   Brief HPI:   78 year old female with a history of anxiety, atrial fibrillation on anticoagulation with Eliquis, hypertension, CKD uncertain baseline who came to ED with abdominal pain.  Patient recently underwent laparoscopic cholecystectomy on 08/22/2019 and was recovering uneventfully back at home but started developing cervical pain of right upper quadrant with radiation towards right shoulder blade in the ED CT abdomen pelvis revealed fluid collection in the gallbladder fossa.  General surgery was consulted HIDA scan was done which confirmed biliary leak and no activity in the small bowel.  Patient underwent gallbladder fossa drain placement per IR and ERCP with biliary duct stone removal and stent placement per GI.    Subjective   Patient seen and examined, came back from CT abdomen pelvis.  Denies abdominal pain.   Assessment/Plan:     1. Right upper quadrant pain/biliary leak-HIDA scan suggested biliary leak after patient presents with right upper quadrant pain.  CT showed fluid collection in the gallbladder fossa.  Biliary drain placement per IR yielded significant amount of fluid.  ERCP showed choledocholithiasis, underwent removal of stone and stent placement.  GI has signed off.  Patient was empirically started on IV Zosyn, and has completed 10 days of IV Zosyn.  Antibiotics have been discontinued.  Repeat CT abdomen today shows percutaneous pigtail drain in gallbladder fossa, no residual fluid collection/abscess.  2. Post ERCP pancreatitis-patient developed post ERCP pancreatitis with lipase to 732 which improved to 92.  Her diet has been advanced to solid food which she has been tolerating.    3. Acute respiratory failure with hypoxia-patient developed acute on chronic diastolic CHF exacerbation, secondary to fluid overload.  Echocardiogram showed EF 60 to  65% improved with IV Lasix.  Currently she is on Lasix 20 mg p.o. daily.  She is on 1 L of oxygen via nasal cannula.  Continue to wean off oxygen.  Follow BMP in a.m.  4. A. fib with RVR- CHA2DS2VASc score of at least 5 patient went into A. fib in the ED.  She was started on Cardizem drip which has been stopped.  She is on Eliquis for anticoagulation.  Continue metoprolol for rate control.  5. Hypertension-blood pressure is stable.  6. Hypokalemia-potassium is 3.4.  Will start daily p.o. K. Dur 20 mEq.  Follow BMP in a.m.   CBC: Recent Labs  Lab 09/06/19 0515 09/07/19 0529 09/08/19 0552 09/09/19 0526 09/10/19 0522  WBC 15.6* 14.5* 11.0* 9.6 8.7  HGB 9.7* 10.2* 10.7* 10.0* 10.1*  HCT 29.8* 31.5* 33.1* 31.2* 31.1*  MCV 97.1 96.0 95.7 97.5 96.3  PLT 430* 463* 552* 582* 630*    Basic Metabolic Panel: Recent Labs  Lab 09/04/19 0858 09/05/19 0202 09/07/19 0529 09/08/19 0552 09/10/19 0522  NA 136 133* 136 137 137  K 3.6 4.0 3.3* 3.4* 3.4*  CL 103 104 100 97* 100  CO2 23 20* 26 29 27   GLUCOSE 106* 98 108* 96 95  BUN 22 19 16 19 19   CREATININE 1.15* 0.98 1.01* 1.13* 0.82  CALCIUM 8.7* 8.5* 8.4* 8.6* 9.0  PHOS  --   --   --  2.9  --      DVT prophylaxis: Apixaban  Code Status: Full code  Family Communication: No family at bedside  Disposition Plan: likely home when medically ready for discharge     BMI  Estimated body mass index is 36.37 kg/m as  calculated from the following:   Height as of this encounter: 5\' 2"  (1.575 m).   Weight as of this encounter: 90.2 kg.  Scheduled medications:  . allopurinol  100 mg Oral Daily  . apixaban  5 mg Oral BID  . diazepam  2.5 mg Oral QHS  . feeding supplement (ENSURE ENLIVE)  237 mL Oral BID BM  . furosemide  20 mg Oral Daily  . mouth rinse  15 mL Mouth Rinse BID  . metoprolol succinate  100 mg Oral Daily  . pantoprazole  40 mg Oral Daily  . potassium chloride  20 mEq Oral Daily  . sodium chloride flush  5 mL  Intracatheter Q8H    Consultants:  Gastroenterology  General surgery  IR  Procedures:  Gallbladder fossa drain placement per IR,  ERCP with sphincterotomy and biliary stent placement   Antibiotics:   Anti-infectives (From admission, onward)   Start     Dose/Rate Route Frequency Ordered Stop   09/10/19 1200  fluconazole (DIFLUCAN) IVPB 100 mg     100 mg 50 mL/hr over 60 Minutes Intravenous Every 24 hours 09/10/19 1033     08/31/19 1200  piperacillin-tazobactam (ZOSYN) IVPB 3.375 g  Status:  Discontinued     3.375 g 12.5 mL/hr over 240 Minutes Intravenous Every 8 hours 08/31/19 0743 09/08/19 1456   08/30/19 0400  piperacillin-tazobactam (ZOSYN) IVPB 2.25 g  Status:  Discontinued     2.25 g 100 mL/hr over 30 Minutes Intravenous Every 8 hours 08/29/19 1747 08/31/19 0743   08/29/19 1800  piperacillin-tazobactam (ZOSYN) IVPB 3.375 g     3.375 g 100 mL/hr over 30 Minutes Intravenous  Once 08/29/19 1745 08/29/19 2043       Objective   Vitals:   09/09/19 1306 09/09/19 2042 09/10/19 0500 09/10/19 0516  BP: 110/60 116/67  118/61  Pulse: 70 82  94  Resp: 16 16  16   Temp: 98.2 F (36.8 C) 98.6 F (37 C)  98.7 F (37.1 C)  TempSrc: Oral Oral  Oral  SpO2: 95% 94%  93%  Weight:   90.2 kg   Height:        Intake/Output Summary (Last 24 hours) at 09/10/2019 1507 Last data filed at 09/10/2019 1306 Gross per 24 hour  Intake 229.84 ml  Output 908 ml  Net -678.16 ml   Filed Weights   09/08/19 0616 09/09/19 0522 09/10/19 0500  Weight: 93.9 kg 92.7 kg 90.2 kg     Physical Examination:   General-appears in no acute distress Heart-S1-S2, regular, no murmur auscultated Lungs-clear to auscultation bilaterally, no wheezing or crackles auscultated Abdomen-soft, nontender, no organomegaly Extremities-no edema in the lower extremities Neuro-alert, oriented x3, no focal deficit noted    Data Reviewed: I have personally reviewed following labs and imaging  studies   Recent Results (from the past 240 hour(s))  Body fluid culture     Status: None   Collection Time: 09/06/19  2:58 PM   Specimen: BILE; Body Fluid  Result Value Ref Range Status   Specimen Description   Final    BILE Performed at [facility], [place] [address], [place], [place] [address]    Special Requests   Final    Normal Performed at [facility], [place] [address] [place], [place] [address]    Gram Stain   Final    NO WBC SEEN ABUNDANT YEAST Performed at [facility], [place] [address] [place],  [address]  Culture ABUNDANT CANDIDA PARAPSILOSIS  Final   Report Status 09/08/2019 FINAL  Final     Liver Function Tests: Recent Labs  Lab 09/04/19 0858 09/05/19 0202 09/07/19 0529 09/08/19 0552  AST 35 38 38  --   ALT 23 25 31   --   ALKPHOS 103 113 99  --   BILITOT 0.7 1.0 1.2  --   PROT 6.4* 6.5 6.2*  --   ALBUMIN 2.7* 2.7* 2.6* 2.4*   Recent Labs  Lab 09/04/19 0858 09/08/19 0552  LIPASE 2,732* 92*   No results for input(s): AMMONIA in the last 168 hours.  Cardiac Enzymes: No results for input(s): CKTOTAL, CKMB, CKMBINDEX, TROPONINI in the last 168 hours. BNP (last 3 results) Recent Labs    08/28/19 2158  BNP 310.5*    ProBNP (last 3 results) No results for input(s): PROBNP in the last 8760 hours.    Studies: Ct Abdomen Wo Contrast  Result Date: 09/10/2019 CLINICAL DATA:  Status post laparoscopic cholecystectomy on 08/22/2019, with gallbladder fossa fluid collection/biloma, status post strain on 10/23. EXAM: CT ABDOMEN WITHOUT CONTRAST TECHNIQUE: Multidetector CT imaging of the abdomen was performed following the standard protocol without IV contrast. COMPARISON:  08/29/2019 FINDINGS: Lower chest: Small bilateral pleural effusions. Associated lower lobe atelectasis. Hyperdense blood pool relative to myocardium, suggesting anemia. Hepatobiliary: Unenhanced liver is grossly unremarkable. Status post  cholecystectomy. No intrahepatic or extrahepatic ductal dilatation. Indwelling common duct stent with associated pneumobilia. Percutaneous pigtail drain in the gallbladder fossa. No residual fluid collection/abscess. Trace stranding with to foci of gas in the gallbladder fossa (series 2/images 28-29). Pancreas: Within normal limits. Spleen: Within normal limits. Adrenals/Urinary Tract: Adrenal glands are within normal limits. Kidneys are within normal limits. Bilateral extrarenal pelvis, right greater than left. Stomach/Bowel: Stomach and visualized bowel are unremarkable. Vascular/Lymphatic: No evidence of abdominal aortic aneurysm. Atherosclerotic calcifications of the abdominal aorta and branch vessels. No suspicious abdominal lymphadenopathy. Other: No abdominal ascites. Musculoskeletal: Degenerative changes of the lower thoracic spine. IMPRESSION: Percutaneous pigtail drain in the gallbladder fossa. No residual fluid collection/abscess. Status post cholecystectomy. Indwelling common duct stent with pneumobilia. Small pleural effusions.  Associated lower atelectasis. Electronically Signed   By: Julian Hy M.D.   On: 09/10/2019 11:10     Admission status: Inpatient: Based on patients clinical presentation and evaluation of above clinical data, I have made determination that patient meets Inpatient criteria at this time.   [facility] Pager [phone]. If 7PM-7AM, please contact night-coverage at [email or web address], Office  [phone]  password [email or web address]  09/10/2019, 3:07 PM  LOS: 12 days

## 2019-09-10 NOTE — Procedures (Signed)
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  EBL: None Complications: None immediate  PLAN: - Drain converted from a JP bulb to a gravity bag. - Do NOT flush drain. - Maintain daily records regarding drain out put.  Ronny Bacon, MD Pager #: [phone]

## 2019-09-10 NOTE — TOC Progression Note (Signed)
Transition of Care [facility]) - Progression Note    Patient Details  Name: CLINT STRUPP MRN: [id number] Date of Birth: [birth year]  Transition of Care [facility]) CM/SW [place], [place] Phone Number: [phone] 09/10/2019, 2:48 PM  Clinical Narrative:   Reviewed SNF bed offers and pt selected [facility] (familiar with facility as husband was there in 2014-2015). Confirmed with facility and initiated insurance authorization request. Pasrr approval still pending.  Re: her financial help questions, CSW was updated that pt states [place] gathered information from her about the bills she is requesting assistance with, will process application. CSW inquired as to how long bills have been issue, pt states, "just now because I am in the hospital. 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, and I can't do any of that while I'm not at home." Pt states she anticipates not being able to afford the financial upkeep of all her properties moving forward and is "also trying to sell all of the things in 2 of my trailers so I can sell them and not have to pay lot rent anymore." Although pt does have [employer] Life to supplement her [place] which can typically be billed for copays at SNF, CSW and pt discussed that with even insurance approval there is still always possibility of accruing a bill when entering any medical facility- pt understanding and reports she still agrees with pursuing short term rehab in order to "be able to get back up and running when I can go home."    Expected Discharge Plan: Skilled Nursing Facility Barriers to Discharge: Ship broker, [place] Forensic scientist)  Expected Discharge Plan and Services Expected Discharge Plan: [place] In-house Referral: Clinical Social Work     Living arrangements for the past 2 months: Mobile Home                                        Social Determinants of Health (SDOH) Interventions    Readmission Risk Interventions No flowsheet data found.

## 2019-09-10 NOTE — Progress Notes (Signed)
S: pain controlled, up to chair yesterday but arthritis acting up today O: BP 118/61 (BP Location: Left Arm)   Pulse 94   Temp 98.7 F (37.1 C) (Oral)   Resp 16   Ht 5\' 2"  (1.575 m)   Wt 90.2 kg   SpO2 93%   BMI 36.37 kg/m  Gen: NAD Neuro: AOx4 Abd: soft, NT, ND  A/P 78 yo female s/p lap chole with stump leak s/p IR drain and ERCP. CT performed today showing no residual collection. Bile collection showed candida -add protein shakes -on antifungal -IR to assess drain for possible removal

## 2019-09-11 ENCOUNTER — Inpatient Hospital Stay (HOSPITAL_COMMUNITY): Payer: Medicare Other

## 2019-09-11 ENCOUNTER — Other Ambulatory Visit: Payer: Self-pay

## 2019-09-11 ENCOUNTER — Encounter (HOSPITAL_COMMUNITY): Payer: Self-pay | Admitting: Interventional Radiology

## 2019-09-11 DIAGNOSIS — K9189 Other postprocedural complications and disorders of digestive system: Secondary | ICD-10-CM

## 2019-09-11 DIAGNOSIS — K838 Other specified diseases of biliary tract: Secondary | ICD-10-CM

## 2019-09-11 LAB — BASIC METABOLIC PANEL
Anion gap: 9 (ref 5–15)
BUN: 22 mg/dL (ref 8–23)
CO2: 26 mmol/L (ref 22–32)
Calcium: 8.8 mg/dL — ABNORMAL LOW (ref 8.9–10.3)
Chloride: 100 mmol/L (ref 98–111)
Creatinine, Ser: 0.93 mg/dL (ref 0.44–1.00)
GFR calc Af Amer: >60 mL/min (ref >60)
GFR calc non Af Amer: 58 mL/min — ABNORMAL LOW (ref >60)
Glucose, Bld: 117 mg/dL — ABNORMAL HIGH (ref 70–99)
Potassium: 3.9 mmol/L (ref 3.5–5.1)
Sodium: 135 mmol/L (ref 135–145)

## 2019-09-11 LAB — CBC
HCT: 31.9 % — ABNORMAL LOW (ref 36.0–46.0)
Hemoglobin: 10.5 g/dL — ABNORMAL LOW (ref 12.0–15.0)
MCH: 31.4 pg (ref 26.0–34.0)
MCHC: 32.9 g/dL (ref 30.0–36.0)
MCV: 95.5 fL (ref 80.0–100.0)
Platelets: 667 10*3/uL — ABNORMAL HIGH (ref 150–400)
RBC: 3.34 MIL/uL — ABNORMAL LOW (ref 3.87–5.11)
RDW: 13.7 % (ref 11.5–15.5)
WBC: 18.4 10*3/uL — ABNORMAL HIGH (ref 4.0–10.5)
nRBC: 0 % (ref 0.0–0.2)

## 2019-09-11 MED ORDER — POLYETHYLENE GLYCOL 3350 17 G PO PACK
17.0000 g | PACK | Freq: Two times a day (BID) | ORAL | Status: DC
  Administered 2019-09-11 – 2019-09-15 (×8): 17 g via ORAL
  Filled 2019-09-11 (×11): qty 1

## 2019-09-11 MED ORDER — PIPERACILLIN-TAZOBACTAM 3.375 G IVPB
3.3750 g | Freq: Three times a day (TID) | INTRAVENOUS | Status: DC
  Administered 2019-09-11 – 2019-09-16 (×16): 3.375 g via INTRAVENOUS
  Filled 2019-09-11 (×19): qty 50

## 2019-09-11 MED ORDER — ALENDRONATE SODIUM 10 MG PO TABS: ORAL_TABLET | ORAL | 10 refills | Status: AC

## 2019-09-11 MED ORDER — SENNOSIDES-DOCUSATE SODIUM 8.6-50 MG PO TABS
1.0000 | ORAL_TABLET | Freq: Two times a day (BID) | ORAL | Status: DC
  Administered 2019-09-11 – 2019-09-16 (×8): 1 via ORAL
  Filled 2019-09-11 (×10): qty 1

## 2019-09-11 NOTE — Progress Notes (Addendum)
Physical Therapy Treatment Patient Details Name: Kristen [name] MRN: [id number] DOB: 09/27/1941 Today's Date: 09/11/2019    History of Present Illness Pt is s/p laparoscopic cholecystectomy on 08/22/2019 and then had biliary leak and no activity in the small bowel. Pt s/p biliary drain 10/23 and s/p ERCP 10/27. PMH: A fib, CHF, mixed anxiety and depressive disorder    PT Comments    Pt declined OOB on today. She performed a few UE and LE exercises. Will continue to check back as schedule allows. Recommend SNF.    Follow Up Recommendations  SNF     Equipment Recommendations  None recommended by PT    Recommendations for Other Services       Precautions / Restrictions Precautions Precautions: Fall Precaution Comments: drain R side abdomen Restrictions Weight Bearing Restrictions: No     Mobility  Bed Mobility               General bed mobility comments: Nt-pt declined OOB today  Transfers                    Ambulation/Gait                 Stairs             Wheelchair Mobility    Modified Rankin (Stroke Patients Only)       Balance                                            Cognition Arousal/Alertness: Awake/alert Behavior During Therapy: WFL for tasks assessed/performed Overall Cognitive Status: Within Functional Limits for tasks assessed                                        Exercises General Exercises - Upper Extremity Shoulder Flexion: AROM;Both;5 reps;Supine Elbow Flexion: AROM;Both;5 reps;Supine General Exercises - Lower Extremity Ankle Circles/Pumps: AROM;Both;5 reps;Supine Quad Sets: AROM;5 reps;Supine;Left Gluteal Sets: AROM;5 reps;Supine    General Comments        Pertinent Vitals/Pain Pain Assessment: Faces Faces Pain Scale: Hurts little more Pain Location: bil knees, R>L Pain Descriptors / Indicators: Discomfort;Sore;Aching Pain Intervention(s): Limited activity  within patient's tolerance    Home Living                      Prior Function            PT Goals (current goals can now be found in the care plan section) Progress towards PT goals: Progressing toward goals    Frequency    Min 2X/week      PT Plan Current plan remains appropriate    Co-evaluation              AM-PAC PT "6 Clicks" Mobility   Outcome Measure  Help needed turning from your back to your side while in a flat bed without using bedrails?: A Little Help needed moving from lying on your back to sitting on the side of a flat bed without using bedrails?: A Little Help needed moving to and from a bed to a chair (including a wheelchair)?: A Little Help needed standing up from a chair using your arms (e.g., wheelchair or bedside chair)?: A Little Help needed to walk in hospital room?: A Lot  Help needed climbing 3-5 steps with a railing? : Total 6 Click Score: 15    End of Session   Activity Tolerance: Patient limited by fatigue;Patient limited by pain Patient left: in bed;with call bell/phone within reach   PT Visit Diagnosis: Muscle weakness (generalized) (M62.81);Pain;Difficulty in walking, not elsewhere classified (R26.2) Pain - Right/Left: Right Pain - part of body: Knee     Time: 4098-1191 PT Time Calculation (min) (ACUTE ONLY): 13 min  Charges:  $Therapeutic Exercise: 8-22 mins                       Weston Anna, PT Acute Rehabilitation Services Pager: [phone] Office: [phone]

## 2019-09-11 NOTE — TOC Progression Note (Signed)
Transition of Care [facility]) - Progression Note    Patient Details  Name: Kristen [name] MRN: [id number] Date of Birth: January 04, 1941  Transition of Care [facility]) CM/SW [place], [place] Phone Number: [phone] 09/11/2019, 1:57 PM  Clinical Narrative:   Rosalie Gums #[id number] A received.  Alena Bills at [facility] provided approved SNF authorization 09/11/19 next review date 09/13/19 [id number].   Updated facility ([facility]).    Expected Discharge Plan: Skilled Nursing Facility Barriers to Discharge: Barriers Resolved(when medically stable)  Expected Discharge Plan and Services Expected Discharge Plan: [place] In-house Referral: Clinical Social Work     Living arrangements for the past 2 months: Mobile Home                                       Social Determinants of Health (SDOH) Interventions    Readmission Risk Interventions No flowsheet data found.

## 2019-09-11 NOTE — Progress Notes (Signed)
Pharmacy Antibiotic Note  Kristen [name] is a 78 y.o. female admitted on 08/28/2019 with intra-abdominal infection.  Pharmacy has been consulted for zosyn dosing.  Plan: Zosyn 3.375g IV Q8H infused over 4hrs. Dose adjustment unlikely, Pharmacy will sign off and follow peripherally   Height: 5\' 2"  (157.5 cm) Weight: 198 lb 13.7 oz (90.2 kg) IBW/kg (Calculated) : 50.1  Temp (24hrs), Avg:99.7 F (37.6 C), Min:98.1 F (36.7 C), Max:101.8 F (38.8 C)  Recent Labs  Lab 09/05/19 0202  09/07/19 0529 09/08/19 0552 09/09/19 0526 09/10/19 0522 09/11/19 0459  WBC 12.6*   < > 14.5* 11.0* 9.6 8.7 18.4*  CREATININE 0.98  --  1.01* 1.13*  --  0.82 0.93   < > = values in this interval not displayed.    Estimated Creatinine Clearance: 51.2 mL/min (by C-G formula based on SCr of 0.93 mg/dL).    Allergies  Allergen Reactions  . Flecainide Acetate Other (See Comments)    REACTION: SOB, swelling (hospitalized)     Thank you for allowing pharmacy to be a part of this patient's care.  Dolly Rias RPh 09/11/2019, 11:31 AM

## 2019-09-11 NOTE — Progress Notes (Signed)
Referring Physician(s): Outlaw,W  Supervising Physician: Arne Cleveland  Patient Status:  [facility] - In-pt  Chief Complaint:  Abdominal pain   Subjective: Pt with recent temp elevation to 101.8, now afebrile; still has some mild RUQ discomfort, diminished appetite and occ nausea; did have small BM recently but still feels constipated   Allergies: Flecainide acetate  Medications: Prior to Admission medications   Medication Sig Start Date End Date Taking? Authorizing Provider  alendronate (FOSAMAX) 10 MG tablet TAKE (1) TABLET BY MOUTH PER WEEK TAKE ON EMPTY STOMACH WITH FULL GLASS OF WATER Patient taking differently: Take 10 mg by mouth daily before breakfast. TAKE (1) TABLET BY MOUTH PER WEEK TAKE ON EMPTY STOMACH WITH FULL GLASS OF WATER 05/02/19  Yes [name], MD  allopurinol (ZYLOPRIM) 100 MG tablet TAKE 1 TABLET BY MOUTH EVERY DAY Patient taking differently: Take 100 mg by mouth daily.  06/24/19  Yes [name], MD  calcium carbonate (OSCAL) 1500 (600 Ca) MG TABS tablet Take 1,500 mg by mouth daily.    Yes [provider]  calcium carbonate (TUMS - DOSED IN MG ELEMENTAL CALCIUM) 500 MG chewable tablet Chew 1 tablet by mouth as needed for indigestion or heartburn.   Yes [provider]  cholecalciferol (VITAMIN D3) 25 MCG (1000 UT) tablet Take 1,000 Units by mouth daily.   Yes [provider]  CINNAMON PO Take 1 tablet by mouth daily.    Yes [provider]  clindamycin (CLINDAGEL) 1 % gel APPLY TO AFFECTED AREA TWICE A DAY Patient taking differently: Apply 1 application topically 2 (two) times daily.  04/13/18  Yes [name], MD  colchicine 0.6 MG tablet Take 1 tablet (0.6 mg total) by mouth 2 (two) times daily. Take twice daily until pain resolves and then take as needed for gout flare. Patient taking differently: Take 0.6 mg by mouth 2 (two) times daily as needed. Take twice daily until pain resolves and then take as needed for  gout flare. 06/15/18  Yes [name], MD  [employer] ANTI-FUNGAL 2 % powder APPLY TO AFFECTED AREA TWICE A DAY AFTER SKIN RESOLVES FROM KETOCONAZOLE CREAM Patient taking differently: as needed.  10/17/14  Yes [name], MD  diazepam (VALIUM) 5 MG tablet TAKE 1/2 TABLET (2.5 MG TOTAL) BY MOUTH AT BEDTIME. Patient taking differently: Take 2.5 mg by mouth at bedtime.  07/08/19  Yes [name], MD  doxycycline (VIBRAMYCIN) 50 MG capsule TAKE 2 CAPSULES BY MOUTH 2 (TWO) TIMES DAILY. Patient taking differently: Take 100 mg by mouth See admin instructions. Twice daily As directed for deer tick's (allergic reaction) 04/25/18  Yes [name], MD  ELIQUIS 5 MG TABS tablet TAKE 1 TABLET BY MOUTH TWICE A DAY Patient taking differently: Take 5 mg by mouth 2 (two) times daily.  05/23/19  Yes Park Liter, MD  Flax Oil-Fish Oil-Borage Oil (FISH-FLAX-BORAGE) CAPS Take 1 capsule by mouth daily.   Yes [provider]  furosemide (LASIX) 20 MG tablet Take 1 tablet (20 mg total) by mouth daily. Patient taking differently: Take 20 mg by mouth daily.  08/19/19  Yes Park Liter, MD  glucosamine-chondroitin (GLUCOSAMINE-CHONDROITIN DS) 500-400 MG tablet Take 1 tablet by mouth daily.    Yes [provider]  HYDROcodone-acetaminophen (NORCO/VICODIN) 5-325 MG tablet Take 1 tablet by mouth every 6 (six) hours as needed for moderate pain. 08/22/19  Yes [name], [name], MD  ibuprofen (ADVIL) 800 MG tablet Take 1 tablet (800 mg  total) by mouth every 8 (eight) hours as needed. 08/22/19  Yes [name], [name], MD  lisinopril-hydrochlorothiazide (ZESTORETIC) 20-25 MG tablet Take 1 tablet by mouth daily. Patient taking differently: Take 1 tablet by mouth daily.  03/08/19  Yes [name], MD  loratadine (CLARITIN) 10 MG tablet Take 1 tablet (10 mg total) by mouth daily. 07/08/19  Yes [name], MD  metoprolol succinate (TOPROL-XL) 100 MG 24 hr tablet Take 1 tablet (100 mg  total) by mouth daily. Take with or immediately following a meal. Patient taking differently: Take 100 mg by mouth daily. Take with or immediately following a meal. 01/29/19 08/28/19 Yes Park Liter, MD  Multiple Vitamins-Minerals (EMERGEN-C IMMUNE) PACK Take 1 tablet by mouth daily.    Yes [provider]  Multiple Vitamins-Minerals (MULTIVITAMIN WOMEN 50+) TABS Take 1 tablet by mouth daily.    Yes [provider]  mupirocin ointment (BACTROBAN) 2 % APPLY TOPICALLY TO AFFECTED AREA TWICE DAILY Patient taking differently: as needed.  02/20/19  Yes [name], MD  Nutritional Supplements (ECHINACEA/GOLDEN SEAL PO) Take 1 tablet by mouth daily.    Yes [provider]  polycarbophil (FIBERCON) 625 MG tablet Take 625 mg by mouth daily.   Yes [provider]  potassium chloride (KLOR-CON) 20 MEQ packet MIX 1 PACKET IN LIQUID AND TAKE BY MOUTH DAILY AS DIRECTED Patient taking differently: Take 20 mEq by mouth daily. MIX 1 PACKET IN LIQUID AND TAKE BY MOUTH DAILY AS DIRECTED 02/20/19  Yes [name], MD  Probiotic Product (PROBIOTIC-10 PO) Take 1 tablet by mouth daily.    Yes [provider]  TURMERIC PO Take 1 tablet by mouth daily.    Yes [provider]     Vital Signs: BP (!) 129/58 (BP Location: Left Arm)   Pulse 95   Temp 98.1 F (36.7 C)   Resp 18   Ht 5\' 2"  (1.575 m)   Wt 198 lb 13.7 oz (90.2 kg)   SpO2 92%   BMI 36.37 kg/m   Physical Exam awake/alert; RUQ drain intact, output minimal, site mildly tender  Imaging: Ct Abdomen Wo Contrast  Result Date: 09/10/2019 CLINICAL DATA:  Status post laparoscopic cholecystectomy on 08/22/2019, with gallbladder fossa fluid collection/biloma, status post strain on 10/23. EXAM: CT ABDOMEN WITHOUT CONTRAST TECHNIQUE: Multidetector CT imaging of the abdomen was performed following the standard protocol without IV contrast. COMPARISON:  08/29/2019 FINDINGS: Lower chest: Small  bilateral pleural effusions. Associated lower lobe atelectasis. Hyperdense blood pool relative to myocardium, suggesting anemia. Hepatobiliary: Unenhanced liver is grossly unremarkable. Status post cholecystectomy. No intrahepatic or extrahepatic ductal dilatation. Indwelling common duct stent with associated pneumobilia. Percutaneous pigtail drain in the gallbladder fossa. No residual fluid collection/abscess. Trace stranding with to foci of gas in the gallbladder fossa (series 2/images 28-29). Pancreas: Within normal limits. Spleen: Within normal limits. Adrenals/Urinary Tract: Adrenal glands are within normal limits. Kidneys are within normal limits. Bilateral extrarenal pelvis, right greater than left. Stomach/Bowel: Stomach and visualized bowel are unremarkable. Vascular/Lymphatic: No evidence of abdominal aortic aneurysm. Atherosclerotic calcifications of the abdominal aorta and branch vessels. No suspicious abdominal lymphadenopathy. Other: No abdominal ascites. Musculoskeletal: Degenerative changes of the lower thoracic spine. IMPRESSION: Percutaneous pigtail drain in the gallbladder fossa. No residual fluid collection/abscess. Status post cholecystectomy. Indwelling common duct stent with pneumobilia. Small pleural effusions.  Associated lower atelectasis. Electronically Signed   By: Julian Hy M.D.   On: 09/10/2019 11:10    Labs:  CBC: Recent Labs  09/08/19 0552 09/09/19 0526 09/10/19 0522 09/11/19 0459  WBC 11.0* 9.6 8.7 18.4*  HGB 10.7* 10.0* 10.1* 10.5*  HCT 33.1* 31.2* 31.1* 31.9*  PLT 552* 582* 630* 667*    COAGS: Recent Labs    08/29/19 1805 08/31/19 1226  INR 1.1 1.1  APTT  --  32    BMP: Recent Labs    09/07/19 0529 09/08/19 0552 09/10/19 0522 09/11/19 0459  NA 136 137 137 135  K 3.3* 3.4* 3.4* 3.9  CL 100 97* 100 100  CO2 26 29 27 26   GLUCOSE 108* 96 95 117*  BUN 16 19 19 22   CALCIUM 8.4* 8.6* 9.0 8.8*  CREATININE 1.01* 1.13* 0.82 0.93  GFRNONAA  53* 46* >60 58*  GFRAA >60 54* >60 >60    LIVER FUNCTION TESTS: Recent Labs    09/03/19 0154 09/04/19 0858 09/05/19 0202 09/07/19 0529 09/08/19 0552  BILITOT 0.7 0.7 1.0 1.2  --   AST 29 35 38 38  --   ALT 18 23 25 31   --   ALKPHOS 102 103 113 99  --   PROT 6.5 6.4* 6.5 6.2*  --   ALBUMIN 2.5* 2.7* 2.7* 2.6* 2.4*    Assessment and Plan: Pt s/p lap chole 10/15; now with GB fossa fluid collection/biloma, s/p drain placement 10/23;s/p ERCP with removal of CBD stone/ CBD stent 10/27; afebrile; WBC nl; bile cx- candida- on antifungal; WBC 18.4(8.7), creat nl;  f/u CT yesterday revealed no residual fluid collection/abscess in GB fossa; CBD stent in place with pneumobilia, small pleural effusions; drain injection yesterday demonstrated persistent communication btwn decompressed GB fossa fluid collection and biliary tree with 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 Rowe Robert, PA-C 09/11/2019, 9:24 AM   I spent a total of 15 minutes at the the patient's bedside AND on the patient's hospital floor or unit, greater than 50% of which was counseling/coordinating care for gallbladder fossa fluid collection drain    Patient ID: Kristen [name], female   DOB: 09/01/1941, 78 y.o.   MRN: [id number]

## 2019-09-11 NOTE — Progress Notes (Signed)
MEWS Guidelines - Pt now in Yellow MEWS b/o fever 101.8. Blanket layers removed, tylenol given, cool cloth for face given. Pt c/o aches multiple areas. Amion text sent to oncall [facility]   Red - At High Risk for Deterioration Yellow - At risk for Deterioration  1. Go to room and assess patient 2. Validate data. Is this patient's baseline? If data confirmed: 3. Is this an acute change? 4. Administer prn meds/treatments as ordered. 5. Note Sepsis score 6. Review goals of care 7. Sports coach, RRT nurse and Provider. 8. Ask Provider to come to bedside.  9. Document patient condition/interventions/response. 10. Increase frequency of vital signs and focused assessments to at least q15 minutes x 4, then q30 minutes x2. - If stable, then q1h x3, then q4h x3 and then q8h or dept. routine. - If unstable, contact Provider & RRT nurse. Prepare for possible transfer. 11. Add entry in progress notes using the smart phrase ".MEWS". 1. Go to room and assess patient 2. Validate data. Is this patient's baseline? If data confirmed: 3. Is this an acute change? 4. Administer prn meds/treatments as ordered? 5. Note Sepsis score 6. Review goals of care 7. Sports coach and Provider 8. Call RRT nurse as needed. 9. Document patient condition/interventions/response. 10. Increase frequency of vital signs and focused assessments to at least q2h x2. - If stable, then q4h x2 and then q8h or dept. routine. - If unstable, contact Provider & RRT nurse. Prepare for possible transfer. 11. Add entry in progress notes using the smart phrase ".MEWS".  Green - Likely stable Lavender - Comfort Care Only  1. Continue routine/ordered monitoring.  2. Review goals of care. 1. Continue routine/ordered monitoring. 2. Review goals of care.

## 2019-09-11 NOTE — Progress Notes (Signed)
PROGRESS NOTE    Kristen [name]  HGD:[id number] DOB: 1941/05/07 DOA: 08/28/2019 PCP: [name], MD    Brief Narrative:  The patient is a 78 year old female with a history of anxiety, atrial fibrillation on anticoagulation with Eliquis, hypertension, CKD uncertain baseline who came to ED with abdominal pain.  Patient recently underwent laparoscopic cholecystectomy on 08/22/2019 and was recovering uneventfully back at home but started developing cervical pain of right upper quadrant with radiation towards right shoulder blade in the ED CT abdomen pelvis revealed fluid collection in the gallbladder fossa.  General surgery was consulted HIDA scan was done which confirmed biliary leak and no activity in the small bowel.  Patient underwent gallbladder fossa drain placement per IR and ERCP with biliary duct stone removal and stent placement per GI.  CT scan done yesterday showed no residual collection but bile collection did show Candida and patient was started on antifungals.  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  IR recommending continuing maintaining drain for now and repeating follow-up injection in 1 week.  Because she spiked a temperature and had an elevated leukocytosis blood cultures were obtained and she is started on empiric antibiotics.  Patient continues to feel constipated so bowel regimen was added  Assessment & Plan:   Principal Problem:   Acute renal failure (ARF) (HCC) Active Problems:   Hypertensive heart disease   Permanent atrial fibrillation (HCC)   Acute on chronic diastolic CHF (congestive heart failure) (HCC)   Situational mixed anxiety and depressive disorder   Intraabdominal fluid collection   A-fib (HCC)   Right Upper Quadrant  pain/Biliary Leak -HIDA scan suggested biliary leak after patient presents with right upper quadrant pain. -CT showed fluid collection in the gallbladder fossa.   -Biliary drain placement per IR yielded significant amount of fluid.   -ERCP showed choledocholithiasis, underwent removal of stone and stent placement.  GI has signed off.   -Patient was empirically started on IV Zosyn, and had completed 10 days of IV Zosyn but will resume now given fevers and worsened WBC.   -Repeat CT abdomen today shows percutaneous pigtail drain in gallbladder fossa, no residual fluid collection/abscess but she did spike a temperature yesterday -IR Recommending continuing Drain for at least another week -Bile showed Candida Parapsilosis  -Pan Cx as below   Post ERCP Pancreatitis -patient developed post ERCP pancreatitis with lipase to 732 which improved to 92. -Her diet has been advanced to solid food which she has been tolerating.    Acute Respiratory Failure with Hypoxia Acute on Chronic Diastolic CHF -patient developed acute on chronic diastolic CHF exacerbation, secondary to fluid overload.   -Echocardiogram showed EF 60 to 65% improved with IV Lasix.   -Currently she is on Lasix 20 mg p.o. daily.   -She is on 2 L of oxygen via nasal cannula.  Continue to wean off oxygen.   -Repeat CXR  -Continue to Monitor Respirator Status Carefully  A. fib with RVR -CHA2DS2VASc score of at least 5 patient went into A. fib in the ED.  -She was started on Cardizem drip which has been stopped.   -She is on Eliquis for anticoagulation.   -Continue Metoprolol Succinate 100 mg po Daily for rate control.  Hypertension -blood pressure is stable.  Hypokalemia -Potassium was 3.4 and improved to 3.9 -In the  setting of Diuresis -Continue to Monitor and Replete as Necessary -Repeat CMP in AM   GERD -C/w PPI  Depression and Anxiety -C/w Diazepam 2.5 mg po qHS   Fever and Leukocytosis -Pan Culture -Had a T-max of  101.8 yesterday and WBC went from 8.7 is now 18.4 and worsened -Obtain Blood Cx x2, urinalysis and urine culture as well as chest x-ray -CT of the abdomen pelvis done yesterday showed "Percutaneous pigtail drain in the gallbladder fossa. No residual fluid collection/abscess. Status post cholecystectomy. Indwelling common duct stent with pneumobilia.Small pleural effusions.  Associated lower atelectasis." -Continue to Monitor and Trend Temperature Curve and WBC -Repeat CBC in AM and follow Cx's   Obesity -Estimated body mass index is 36.37 kg/m as calculated from the following:   Height as of this encounter: 5\' 2"  (1.575 m).   Weight as of this encounter: 90.2 kg. -Weight Loss and Dietary Counseling given   DVT prophylaxis: Anticoagulated with Apixaban 5 mg po BID  Code Status: FULL CODE  Family Communication: No family  Disposition Plan: Pending Clinical Improvement and Clearance by General Surgery and IR   Consultants:  -General Surgery -Gastroenterology -Interventional Radiology   Procedures:  Pt s/p lap chole 10/15; now with GB fossa fluid collection/biloma, s/p drain placement 10/23;s/p ERCP with removal of CBD stone/ CBD stent 10/27  Drain Injection 09/10/2019 demonstrated persistent communication btwn decompressed GB fossa fluid collection and biliary tree with 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; JP bulb converted to gravity bag  Antimicrobials:  Anti-infectives (From admission, onward)   Start     Dose/Rate Route Frequency Ordered Stop   09/11/19 1200  piperacillin-tazobactam (ZOSYN) IVPB 3.375 g     3.375 g 12.5 mL/hr over 240 Minutes Intravenous Every 8 hours 09/11/19 1129     09/10/19 1200  fluconazole (DIFLUCAN) IVPB 100 mg     100 mg 50 mL/hr over 60 Minutes Intravenous Every 24 hours 09/10/19 1033     08/31/19 1200  piperacillin-tazobactam (ZOSYN) IVPB 3.375 g  Status:  Discontinued      3.375 g 12.5 mL/hr over 240 Minutes Intravenous Every 8 hours 08/31/19 0743 09/08/19 1456   08/30/19 0400  piperacillin-tazobactam (ZOSYN) IVPB 2.25 g  Status:  Discontinued     2.25 g 100 mL/hr over 30 Minutes Intravenous Every 8 hours 08/29/19 1747 08/31/19 0743   08/29/19 1800  piperacillin-tazobactam (ZOSYN) IVPB 3.375 g     3.375 g 100 mL/hr over 30 Minutes Intravenous  Once 08/29/19 1745 08/29/19 2043     Subjective: Seen and examined at bedside states that she was feeling "rough".  States that she was feeling better than yesterday.  No nausea or vomiting.  Still complaining of some constipation and states that she is passing "hard stool balls".  No chest pain, lightheadedness or dizziness but did have some abdominal pain.  No other concerns or complaints at this time.  Objective: Vitals:   09/11/19 0546 09/11/19 0754 09/11/19 1148 09/11/19 1601  BP: (!) 129/58  127/76 138/81  Pulse: 95  84 79  Resp: 18  20 20   Temp: (!) 101.8 F (38.8 C) 98.1 F (36.7 C) 98.1 F (36.7 C) 98.1 F (36.7 C)  TempSrc: Oral  Oral Oral  SpO2: 92%  93% 94%  Weight:      Height:        Intake/Output Summary (Last 24 hours) at 09/11/2019 1738 Last data filed at 09/11/2019 1540 Gross  per 24 hour  Intake 1494.75 ml  Output 1600 ml  Net -105.25 ml   Filed Weights   09/08/19 0616 09/09/19 0522 09/10/19 0500  Weight: 93.9 kg 92.7 kg 90.2 kg   Examination: Physical Exam:  Constitutional: WN/WD obese Caucasian female in NAD and appears calm  Eyes: Lids and conjunctivae normal, sclerae anicteric  ENMT: External Ears, Nose appear normal. Grossly normal hearing. Mucous membranes are moist Neck: Appears normal, supple, no cervical masses, normal ROM, no appreciable thyromegaly; no JVD Respiratory: Diminished to auscultation bilaterally, no wheezing, rales, rhonchi or crackles. Normal respiratory effort and patient is not tachypenic. No accessory muscle use.  Cardiovascular: RRR, no murmurs / rubs /  gallops. S1 and S2 auscultated. 1+ LE extremity edema. 2+ pedal pulses. No carotid bruits.  Abdomen: Soft, Tender, Distended 2/2 body habitus. Abdominal Drain in place. Bowel sounds positive x4.  GU: Deferred. Musculoskeletal: No clubbing / cyanosis of digits/nails. No joint deformity upper and lower extremities.  Skin: No rashes, lesions, ulcers on a limited skin evaluation. No induration; Warm and dry.  Neurologic: CN 2-12 grossly intact with no focal deficits. Romberg sign and cerebellar reflexes not assessed.  Psychiatric: Normal judgment and insight. Alert and oriented x 3. Mildly anxious mood and appropriate affect.   Data Reviewed: I have personally reviewed following labs and imaging studies  CBC: Recent Labs  Lab 09/07/19 0529 09/08/19 0552 09/09/19 0526 09/10/19 0522 09/11/19 0459  WBC 14.5* 11.0* 9.6 8.7 18.4*  HGB 10.2* 10.7* 10.0* 10.1* 10.5*  HCT 31.5* 33.1* 31.2* 31.1* 31.9*  MCV 96.0 95.7 97.5 96.3 95.5  PLT 463* 552* 582* 630* 700*   Basic Metabolic Panel: Recent Labs  Lab 09/05/19 0202 09/07/19 0529 09/08/19 0552 09/10/19 0522 09/11/19 0459  NA 133* 136 137 137 135  K 4.0 3.3* 3.4* 3.4* 3.9  CL 104 100 97* 100 100  CO2 20* 26 29 27 26   GLUCOSE 98 108* 96 95 117*  BUN 19 16 19 19 22   CREATININE 0.98 1.01* 1.13* 0.82 0.93  CALCIUM 8.5* 8.4* 8.6* 9.0 8.8*  PHOS  --   --  2.9  --   --    GFR: Estimated Creatinine Clearance: 51.2 mL/min (by C-G formula based on SCr of 0.93 mg/dL). Liver Function Tests: Recent Labs  Lab 09/05/19 0202 09/07/19 0529 09/08/19 0552  AST 38 38  --   ALT 25 31  --   ALKPHOS 113 99  --   BILITOT 1.0 1.2  --   PROT 6.5 6.2*  --   ALBUMIN 2.7* 2.6* 2.4*   Recent Labs  Lab 09/08/19 0552  LIPASE 92*   No results for input(s): AMMONIA in the last 168 hours. Coagulation Profile: No results for input(s): INR, PROTIME in the last 168 hours. Cardiac Enzymes: No results for input(s): CKTOTAL, CKMB, CKMBINDEX, TROPONINI in  the last 168 hours. BNP (last 3 results) No results for input(s): PROBNP in the last 8760 hours. HbA1C: No results for input(s): HGBA1C in the last 72 hours. CBG: No results for input(s): GLUCAP in the last 168 hours. Lipid Profile: No results for input(s): CHOL, HDL, LDLCALC, TRIG, CHOLHDL, LDLDIRECT in the last 72 hours. Thyroid Function Tests: No results for input(s): TSH, T4TOTAL, FREET4, T3FREE, THYROIDAB in the last 72 hours. Anemia Panel: No results for input(s): VITAMINB12, FOLATE, FERRITIN, TIBC, IRON, RETICCTPCT in the last 72 hours. Sepsis Labs: No results for input(s): PROCALCITON, LATICACIDVEN in the last 168 hours.  Recent Results (from the past 240  hour(s))  Body fluid culture     Status: None   Collection Time: 09/06/19  2:58 PM   Specimen: BILE; Body Fluid  Result Value Ref Range Status   Specimen Description   Final    BILE Performed at [facility] [address] [place], [place] [address]    Special Requests   Final    Normal Performed at [facility], [place] [address], [place], [place] [address]    Gram Stain   Final    NO WBC SEEN ABUNDANT YEAST Performed at [facility], [place] [address], [place], [place] [address]    Culture ABUNDANT CANDIDA PARAPSILOSIS  Final   Report Status 09/08/2019 FINAL  Final  Culture, blood (routine x 2)     Status: None (Preliminary result)   Collection Time: 09/11/19 12:09 PM   Specimen: BLOOD  Result Value Ref Range Status   Specimen Description   Final    BLOOD LEFT ARM Performed at [facility] [address] [place], [place] [address]    Special Requests   Final    BOTTLES DRAWN AEROBIC AND ANAEROBIC Blood Culture adequate volume Performed at [facility] [address] [place], [place] [address]    Culture   Final    NO GROWTH < 12 HOURS Performed at [facility] [address], [place], [place] [address]    Report  Status PENDING  Incomplete  Culture, blood (routine x 2)     Status: None (Preliminary result)   Collection Time: 09/11/19 12:15 PM   Specimen: BLOOD LEFT HAND  Result Value Ref Range Status   Specimen Description   Final    BLOOD LEFT HAND Performed at [facility] [address] [place], [place] [address]    Special Requests   Final    BOTTLES DRAWN AEROBIC AND ANAEROBIC Blood Culture adequate volume Performed at [facility] [address], [place], [place] [address]    Culture   Final    NO GROWTH < 12 HOURS Performed at [facility] [address] [place], [place] [address]    Report Status PENDING  Incomplete    Radiology Studies: Ct Abdomen Wo Contrast  Result Date: 09/10/2019 CLINICAL DATA:  Status post laparoscopic cholecystectomy on 08/22/2019, with gallbladder fossa fluid collection/biloma, status post strain on 10/23. EXAM: CT ABDOMEN WITHOUT CONTRAST TECHNIQUE: Multidetector CT imaging of the abdomen was performed following the standard protocol without IV contrast. COMPARISON:  08/29/2019 FINDINGS: Lower chest: Small bilateral pleural effusions. Associated lower lobe atelectasis. Hyperdense blood pool relative to myocardium, suggesting anemia. Hepatobiliary: Unenhanced liver is grossly unremarkable. Status post cholecystectomy. No intrahepatic or extrahepatic ductal dilatation. Indwelling common duct stent with associated pneumobilia. Percutaneous pigtail drain in the gallbladder fossa. No residual fluid collection/abscess. Trace stranding with to foci of gas in the gallbladder fossa (series 2/images 28-29). Pancreas: Within normal limits. Spleen: Within normal limits. Adrenals/Urinary Tract: Adrenal glands are within normal limits. Kidneys are within normal limits. Bilateral extrarenal pelvis, right greater than left. Stomach/Bowel: Stomach and visualized bowel are unremarkable. Vascular/Lymphatic: No evidence of abdominal aortic  aneurysm. Atherosclerotic calcifications of the abdominal aorta and branch vessels. No suspicious abdominal lymphadenopathy. Other: No abdominal ascites. Musculoskeletal: Degenerative changes of the lower thoracic spine. IMPRESSION: Percutaneous pigtail drain in the gallbladder fossa. No residual fluid collection/abscess. Status post cholecystectomy. Indwelling common duct stent with pneumobilia. Small pleural effusions.  Associated lower atelectasis. Electronically  Signed   By: Julian Hy M.D.   On: 09/10/2019 11:10   Ir Sinus/fist Tube Chk-non Gi  Result Date: 09/11/2019 CLINICAL DATA:  History of laparoscopic cholecystectomy complicated by development of bile leak post percutaneous drainage catheter placement into gallbladder fossa fluid collection on 08/30/2019 (Dr. Laurence Ferrari). Additionally, patient has undergone ERCP with internal biliary stent placement on 09/03/2019. Subsequent CT scan the abdomen pelvis performed earlier today demonstrates complete resolution the gallbladder fossa fluid collection and as such patient now presents for drainage catheter injection prior to potential removal. EXAM: FLUOROSCOPIC GUIDED DRAIN INJECTION COMPARISON:  CT abdomen pelvis-earlier same day; 08/29/2019; CT-guided percutaneous drainage catheter placement-08/30/2019; ERCP-09/03/2019 CONTRAST:  106mL OMNIPAQUE IOHEXOL 300 MG/ML SOLN-administered via the existing percutaneous drainage catheter FLUOROSCOPY TIME:  1 minutes, 18 seconds (69 mGy) TECHNIQUE: The patient was positioned supine on the fluoroscopy table. A preprocedural spot fluoroscopic image was obtained of the right upper abdominal quadrant and existing percutaneous drainage catheter Multiple spot fluoroscopic and radiographic images were obtained following the injection of a small amount of contrast via the existing percutaneous drainage catheter. Images reviewed and the drainage catheter was flushed with a small amount of saline. The drainage catheter  was converted from a JP bulb to a gravity bag. A dressing was applied. The patient tolerated the procedure well without immediate postprocedural complication. FINDINGS: Preprocedural spot fluoroscopic image demonstrates unchanged positioning percutaneous drainage catheter with end coiled and locked over the lying the expected location the gallbladder fossa. An internal plastic biliary stent overlies expected location of the CBD Contrast injection demonstrates opacification of the decompressed gallbladder fossa fluid collection with persistent communication to the biliary tree though to the expected location of the cystic duct remnant, rather to a division of the right biliary tree. There is passage of contrast through the CBD and biliary stent to the level of the duodenum. There is minimal opacification of the cystic duct remnant without definitive evidence of contrast extravasation from the cystic duct remnant. IMPRESSION: 1. Fluoroscopic guided drainage catheter injection demonstrates findings worrisome for bile leak from a division of the right biliary tree rather than the cystic duct remnant. 2. Widely patent and appropriately positioned internal biliary stent. Above findings discussed with Dr. Kieth Brightly on 09/11/2019. PLAN: - The patient drainage catheter was converted from a JP bulb to a gravity bag. - The patient was instructed to not flush the percutaneous drainage catheter however to maintain diligent records regarding drainage catheter output. - Repeat drain injection may be performed in 2 weeks as deemed appropriate by Dr. Kieth Brightly. Electronically Signed   By: Sandi Mariscal M.D.   On: 09/11/2019 15:40   Scheduled Meds: . allopurinol  100 mg Oral Daily  . apixaban  5 mg Oral BID  . diazepam  2.5 mg Oral QHS  . feeding supplement (ENSURE ENLIVE)  237 mL Oral BID BM  . furosemide  20 mg Oral Daily  . mouth rinse  15 mL Mouth Rinse BID  . metoprolol succinate  100 mg Oral Daily  . pantoprazole  40 mg  Oral Daily  . polyethylene glycol  17 g Oral BID  . potassium chloride  20 mEq Oral Daily  . senna-docusate  1 tablet Oral BID  . sodium chloride flush  5 mL Intracatheter Q8H   Continuous Infusions: . sodium chloride 25 mL/hr at 09/06/19 1457  . fluconazole (DIFLUCAN) IV 100 mg (09/11/19 1216)  . piperacillin-tazobactam (ZOSYN)  IV 3.375 g (09/11/19 1326)    LOS: 13 days  Kerney Elbe, DO [facility] PAGER is on AMION  If 7PM-7AM, please contact night-coverage [email or web address] Password [facility] 09/11/2019, 5:38 PM

## 2019-09-12 DIAGNOSIS — J189 Pneumonia, unspecified organism: Secondary | ICD-10-CM

## 2019-09-12 LAB — URINALYSIS, ROUTINE W REFLEX MICROSCOPIC
Bilirubin Urine: NEGATIVE
Glucose, UA: NEGATIVE mg/dL
Ketones, ur: NEGATIVE mg/dL
Leukocytes,Ua: NEGATIVE
Nitrite: NEGATIVE
Protein, ur: NEGATIVE mg/dL
Specific Gravity, Urine: 1.015 (ref 1.005–1.030)
pH: 7 (ref 5.0–8.0)

## 2019-09-12 LAB — CBC WITH DIFFERENTIAL/PLATELET
Abs Immature Granulocytes: 0.15 10*3/uL — ABNORMAL HIGH (ref 0.00–0.07)
Basophils Absolute: 0.1 10*3/uL (ref 0.0–0.1)
Basophils Relative: 1 %
Eosinophils Absolute: 1.2 10*3/uL — ABNORMAL HIGH (ref 0.0–0.5)
Eosinophils Relative: 8 %
HCT: 31.9 % — ABNORMAL LOW (ref 36.0–46.0)
Hemoglobin: 10.2 g/dL — ABNORMAL LOW (ref 12.0–15.0)
Immature Granulocytes: 1 %
Lymphocytes Relative: 15 %
Lymphs Abs: 2.3 10*3/uL (ref 0.7–4.0)
MCH: 31.3 pg (ref 26.0–34.0)
MCHC: 32 g/dL (ref 30.0–36.0)
MCV: 97.9 fL (ref 80.0–100.0)
Monocytes Absolute: 1.1 10*3/uL — ABNORMAL HIGH (ref 0.1–1.0)
Monocytes Relative: 7 %
Neutro Abs: 10.3 10*3/uL — ABNORMAL HIGH (ref 1.7–7.7)
Neutrophils Relative %: 68 %
Platelets: 658 10*3/uL — ABNORMAL HIGH (ref 150–400)
RBC: 3.26 MIL/uL — ABNORMAL LOW (ref 3.87–5.11)
RDW: 13.6 % (ref 11.5–15.5)
WBC: 15.2 10*3/uL — ABNORMAL HIGH (ref 4.0–10.5)
nRBC: 0 % (ref 0.0–0.2)

## 2019-09-12 LAB — COMPREHENSIVE METABOLIC PANEL
ALT: 19 U/L (ref 0–44)
AST: 21 U/L (ref 15–41)
Albumin: 2.6 g/dL — ABNORMAL LOW (ref 3.5–5.0)
Alkaline Phosphatase: 92 U/L (ref 38–126)
Anion gap: 10 (ref 5–15)
BUN: 19 mg/dL (ref 8–23)
CO2: 31 mmol/L (ref 22–32)
Calcium: 8.9 mg/dL (ref 8.9–10.3)
Chloride: 96 mmol/L — ABNORMAL LOW (ref 98–111)
Creatinine, Ser: 0.95 mg/dL (ref 0.44–1.00)
GFR calc Af Amer: >60 mL/min (ref >60)
GFR calc non Af Amer: 57 mL/min — ABNORMAL LOW (ref >60)
Glucose, Bld: 103 mg/dL — ABNORMAL HIGH (ref 70–99)
Potassium: 3.7 mmol/L (ref 3.5–5.1)
Sodium: 137 mmol/L (ref 135–145)
Total Bilirubin: 0.7 mg/dL (ref 0.3–1.2)
Total Protein: 6.9 g/dL (ref 6.5–8.1)

## 2019-09-12 LAB — URINE CULTURE

## 2019-09-12 LAB — PHOSPHORUS: Phosphorus: 3.5 mg/dL (ref 2.5–4.6)

## 2019-09-12 LAB — MAGNESIUM: Magnesium: 2.2 mg/dL (ref 1.7–2.4)

## 2019-09-12 MED ORDER — FUROSEMIDE 20 MG PO TABS
20.0000 mg | ORAL_TABLET | Freq: Once | ORAL | Status: AC
  Administered 2019-09-12: 20 mg via ORAL
  Filled 2019-09-12: qty 1

## 2019-09-12 NOTE — Progress Notes (Signed)
PROGRESS NOTE    Kristen [name]  FHL:[id number] DOB: 1940-11-27 DOA: 08/28/2019 PCP: [name], MD    Brief Narrative:  The patient is a 78 year old female with a history of anxiety, atrial fibrillation on anticoagulation with Eliquis, hypertension, CKD uncertain baseline who came to ED with abdominal pain.  Patient recently underwent laparoscopic cholecystectomy on 08/22/2019 and was recovering uneventfully back at home but started developing cervical pain of right upper quadrant with radiation towards right shoulder blade in the ED CT abdomen pelvis revealed fluid collection in the gallbladder fossa.  General surgery was consulted HIDA scan was done which confirmed biliary leak and no activity in the small bowel.  Patient underwent gallbladder fossa drain placement per IR and ERCP with biliary duct stone removal and stent placement per GI.  CT scan done yesterday showed no residual collection but bile collection did show Candida and patient was started on antifungals.  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  IR recommending continuing maintaining drain for now and repeating follow-up injection in 1 week.  Because she spiked a temperature and had an elevated leukocytosis blood cultures were obtained and she is started on empiric antibiotics and now she is afebrile and white blood cell count is trending down.  Likely the patient has a left-sided pneumonia and question if this is from aspiration..  Patient continues to feel constipated so bowel regimen was added  Assessment & Plan:   Principal Problem:   Acute renal failure (ARF) (HCC) Active Problems:   Hypertensive heart disease   Permanent atrial fibrillation (HCC)   Acute on chronic diastolic CHF (congestive  heart failure) (HCC)   Situational mixed anxiety and depressive disorder   Intraabdominal fluid collection   A-fib (HCC)   Right Upper Quadrant pain/Biliary Leak -HIDA scan suggested biliary leak after patient presents with right upper quadrant pain. -CT showed fluid collection in the gallbladder fossa.   -Biliary drain placement per IR yielded significant amount of fluid.   -ERCP showed choledocholithiasis, underwent removal of stone and stent placement.  GI has signed off.   -Patient was empirically started on IV Zosyn, and had completed 10 days of IV Zosyn but will resume now given fevers and worsened WBC.   -Repeat CT abdomen today shows percutaneous pigtail drain in gallbladder fossa, no residual fluid collection/abscess but she did spike a temperature yesterday -IR Recommending continuing Drain for at least another week -Bile showed Candida Parapsilosis  -Pan Cx as below and likely patient has a pneumonia -Blood cultures x2 and fungus culture showed no growth to date less than 24 hours and urine culture was negative -IR recommending continuing the drain and discontinuing drain flushes and repeating drain injection 1 to 2 weeks  Post ERCP Pancreatitis -patient developed post ERCP pancreatitis with lipase to 732 which improved to 92. -Her diet has been advanced to solid food which she has been tolerating.    Acute Respiratory Failure with Hypoxia Acute on Chronic Diastolic CHF -patient developed acute on chronic diastolic CHF exacerbation, secondary to fluid overload.   -Echocardiogram showed EF 60 to 65% improved with IV Lasix.   -Currently she is on Lasix 20 mg p.o. daily.   -She is on 2 L of oxygen via nasal cannula.  Continue to wean off oxygen.   -Repeat CXR  -Continue to Monitor  Respirator Status Carefully  A. fib with RVR -CHA2DS2VASc score of at least 5 patient went into A. fib in the ED.  -She was started on Cardizem drip which has been stopped.   -She is on Eliquis for  anticoagulation.   -Continue Metoprolol Succinate 100 mg po Daily for rate control.  Hypertension -blood pressure is stable.  Hypokalemia -Potassium was 3.4 and improved to 3.7 -In the setting of Diuresis -Continue to Monitor and Replete as Necessary -Repeat CMP in AM   GERD -C/w PPI  Depression and Anxiety -C/w Diazepam 2.5 mg po qHS   Fever and Leukocytosis, improving and likely in the setting of healthcare associated pneumonia -Pan Culture -Had a T-max of 101.8 the day before yesterday and WBC went from 8.7 2 18.4 and worsened; today it is 15.2 -Obtain Blood Cx x2 and showed no growth to date less than 2, urinalysis and urine culture as well as chest x-ray -Fungus culture showing no growth to date less than 24 hours -CT of the abdomen pelvis done yesterday showed "Percutaneous pigtail drain in the gallbladder fossa. No residual fluid collection/abscess. Status post cholecystectomy. Indwelling common duct stent with pneumobilia.Small pleural effusions.  Associated lower atelectasis." -Resumed with Zosyn and temperature is improved and leukocytosis is trending down -Continue to Monitor and Trend Temperature Curve and WBC -Repeat CBC in AM and follow Cx's   Obesity -Estimated body mass index is 37.02 kg/m as calculated from the following:   Height as of this encounter: 5\' 2"  (1.575 m).   Weight as of this encounter: 91.8 kg. -Weight Loss and Dietary Counseling given   DVT prophylaxis: Anticoagulated with Apixaban 5 mg po BID  Code Status: FULL CODE  Family Communication: No family  Disposition Plan: Pending Clinical Improvement and Clearance by General Surgery and IR   Consultants:  -General Surgery -Gastroenterology -Interventional Radiology   Procedures:  Pt s/p lap chole 10/15; now with GB fossa fluid collection/biloma, s/p drain placement 10/23;s/p ERCP with removal of CBD stone/ CBD stent 10/27  Drain Injection 09/10/2019 demonstrated persistent communication  btwn decompressed GB fossa fluid collection and biliary tree with 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; JP bulb converted to gravity bag  Antimicrobials:  Anti-infectives (From admission, onward)   Start     Dose/Rate Route Frequency Ordered Stop   09/11/19 1200  piperacillin-tazobactam (ZOSYN) IVPB 3.375 g     3.375 g 12.5 mL/hr over 240 Minutes Intravenous Every 8 hours 09/11/19 1129     09/10/19 1200  fluconazole (DIFLUCAN) IVPB 100 mg     100 mg 50 mL/hr over 60 Minutes Intravenous Every 24 hours 09/10/19 1033     08/31/19 1200  piperacillin-tazobactam (ZOSYN) IVPB 3.375 g  Status:  Discontinued     3.375 g 12.5 mL/hr over 240 Minutes Intravenous Every 8 hours 08/31/19 0743 09/08/19 1456   08/30/19 0400  piperacillin-tazobactam (ZOSYN) IVPB 2.25 g  Status:  Discontinued     2.25 g 100 mL/hr over 30 Minutes Intravenous Every 8 hours 08/29/19 1747 08/31/19 0743   08/29/19 1800  piperacillin-tazobactam (ZOSYN) IVPB 3.375 g     3.375 g 100 mL/hr over 30 Minutes Intravenous  Once 08/29/19 1745 08/29/19 2043     Subjective: Seen and examined at bedside states that she is still feeling a little "rough".  But feels a little bit better.  Was sitting at the side of bed working with occupational therapy and using her incentive  spirometer.  No nausea or vomiting.  States that she had a bowel movement last night.  No other concerns or complaints this time.  Objective: Vitals:   09/12/19 0500 09/12/19 0521 09/12/19 1309 09/12/19 1500  BP:  (!) 119/58 (!) 107/56   Pulse:  60 (!) 53 62  Resp:  16 18   Temp:  97.7 F (36.5 C) (!) 97.3 F (36.3 C) 97.6 F (36.4 C)  TempSrc:  Oral Oral   SpO2:  97% 98%   Weight: 91.8 kg     Height:        Intake/Output Summary (Last 24 hours) at 09/12/2019 1857 Last data filed at 09/12/2019 1500 Gross per 24 hour  Intake 421.88 ml  Output 850 ml  Net -428.12 ml   Filed Weights    09/09/19 0522 09/10/19 0500 09/12/19 0500  Weight: 92.7 kg 90.2 kg 91.8 kg   Examination: Physical Exam:  Constitutional: WN/WD obese Caucasian female currently in NAD and appears a little uncomfortable sitting at the edge of the bed Eyes: Lids and conjunctivae normal, sclerae anicteric  ENMT: External Ears, Nose appear normal. Grossly normal hearing. Mucous membranes are moist.  Neck: Appears normal, supple, no cervical masses, normal ROM, no appreciable thyromegaly; no JVD Respiratory: Diminished to auscultation bilaterally worse on the Left compared to the right, no wheezing, rales, rhonchi or crackles. Normal respiratory effort and patient is not tachypenic. No accessory muscle use. Unlabored breathing wearing 2 Liters of Supplemental O2 via  Cardiovascular: Irregularly Irregular, no murmurs / rubs / gallops. S1 and S2 auscultated. Trace extremity edema. 2+ pedal pulses. No carotid bruits.  Abdomen: Soft, non-tender, Distended. Drain in place on RUQ. Bowel sounds positive.  GU: Deferred. Musculoskeletal: No clubbing / cyanosis of digits/nails. No joint deformity upper and lower extremities.  Skin: No rashes, lesions, ulcers on a limited skin evaluation. No induration; Warm and dry.  Neurologic: CN 2-12 grossly intact with no focal deficits. Romberg sign and cerebellar reflexes not assessed.  Psychiatric: Normal judgment and insight. Alert and oriented x 3. Normal mood and appropriate affect.   Data Reviewed: I have personally reviewed following labs and imaging studies  CBC: Recent Labs  Lab 09/08/19 0552 09/09/19 0526 09/10/19 0522 09/11/19 0459 09/12/19 0629  WBC 11.0* 9.6 8.7 18.4* 15.2*  NEUTROABS  --   --   --   --  10.3*  HGB 10.7* 10.0* 10.1* 10.5* 10.2*  HCT 33.1* 31.2* 31.1* 31.9* 31.9*  MCV 95.7 97.5 96.3 95.5 97.9  PLT 552* 582* 630* 667* 726*   Basic Metabolic Panel: Recent Labs  Lab 09/07/19 0529 09/08/19 0552 09/10/19 0522 09/11/19 0459 09/12/19 0629   NA 136 137 137 135 137  K 3.3* 3.4* 3.4* 3.9 3.7  CL 100 97* 100 100 96*  CO2 26 29 27 26 31   GLUCOSE 108* 96 95 117* 103*  BUN 16 19 19 22 19   CREATININE 1.01* 1.13* 0.82 0.93 0.95  CALCIUM 8.4* 8.6* 9.0 8.8* 8.9  MG  --   --   --   --  2.2  PHOS  --  2.9  --   --  3.5   GFR: Estimated Creatinine Clearance: 50.6 mL/min (by C-G formula based on SCr of 0.95 mg/dL). Liver Function Tests: Recent Labs  Lab 09/07/19 0529 09/08/19 0552 09/12/19 0629  AST 38  --  21  ALT 31  --  19  ALKPHOS 99  --  92  BILITOT 1.2  --  0.7  PROT  6.2*  --  6.9  ALBUMIN 2.6* 2.4* 2.6*   Recent Labs  Lab 09/08/19 0552  LIPASE 92*   No results for input(s): AMMONIA in the last 168 hours. Coagulation Profile: No results for input(s): INR, PROTIME in the last 168 hours. Cardiac Enzymes: No results for input(s): CKTOTAL, CKMB, CKMBINDEX, TROPONINI in the last 168 hours. BNP (last 3 results) No results for input(s): PROBNP in the last 8760 hours. HbA1C: No results for input(s): HGBA1C in the last 72 hours. CBG: No results for input(s): GLUCAP in the last 168 hours. Lipid Profile: No results for input(s): CHOL, HDL, LDLCALC, TRIG, CHOLHDL, LDLDIRECT in the last 72 hours. Thyroid Function Tests: No results for input(s): TSH, T4TOTAL, FREET4, T3FREE, THYROIDAB in the last 72 hours. Anemia Panel: No results for input(s): VITAMINB12, FOLATE, FERRITIN, TIBC, IRON, RETICCTPCT in the last 72 hours. Sepsis Labs: No results for input(s): PROCALCITON, LATICACIDVEN in the last 168 hours.  Recent Results (from the past 240 hour(s))  Body fluid culture     Status: None   Collection Time: 09/06/19  2:58 PM   Specimen: BILE; Body Fluid  Result Value Ref Range Status   Specimen Description   Final    BILE Performed at [facility] [address] [place], [place] [address]    Special Requests   Final    Normal Performed at [facility], [place] [address]  [place], [place] [address]    Gram Stain   Final    NO WBC SEEN ABUNDANT YEAST Performed at [facility], [place] [address] [place], [place] [address]    Culture ABUNDANT CANDIDA PARAPSILOSIS  Final   Report Status 09/08/2019 FINAL  Final  Fungus culture, blood     Status: None (Preliminary result)   Collection Time: 09/11/19 12:09 PM   Specimen: BLOOD  Result Value Ref Range Status   Specimen Description   Final    BLOOD LEFT ARM Performed at [name] [address], [place], [place] [address]    Special Requests   Final    BOTTLES DRAWN AEROBIC ONLY Blood Culture adequate volume Performed at [facility] [address] [place], [place] [address]    Culture   Final    NO GROWTH < 24 HOURS Performed at [facility] [address], [place], [place] [address]    Report Status PENDING  Incomplete  Culture, blood (routine x 2)     Status: None (Preliminary result)   Collection Time: 09/11/19 12:09 PM   Specimen: BLOOD  Result Value Ref Range Status   Specimen Description   Final    BLOOD LEFT ARM Performed at [facility] [address], [place], [place] [address]    Special Requests   Final    BOTTLES DRAWN AEROBIC AND ANAEROBIC Blood Culture adequate volume Performed at [facility] [address] [place] [address] [place], [place] [address]    Culture   Final    NO GROWTH < 24 HOURS Performed at [facility] [address], [place], [place] [address]    Report Status PENDING  Incomplete  Culture, blood (routine x 2)     Status: None (Preliminary result)   Collection Time: 09/11/19 12:15 PM   Specimen: BLOOD LEFT HAND  Result Value Ref Range Status   Specimen Description   Final    BLOOD LEFT HAND Performed at [facility] [address]  [place], [place] [address]    Special Requests   Final    BOTTLES DRAWN AEROBIC AND ANAEROBIC Blood Culture adequate  volume Performed at [facility] [address], [place], [place] [address]    Culture   Final    NO GROWTH < 24 HOURS Performed at [facility] [address] [place], [place] [address]    Report Status PENDING  Incomplete    Radiology Studies: Dg Chest Port 1 View  Result Date: 09/11/2019 CLINICAL DATA:  Fever EXAM: PORTABLE CHEST 1 VIEW COMPARISON:  09/06/2019 FINDINGS: Cardiomegaly with vascular congestion. Left lower lobe atelectasis or infiltrate. No confluent opacity on the right. No effusions or acute bony abnormality. IMPRESSION: Cardiomegaly, vascular congestion. Left lower lobe atelectasis or infiltrate. Electronically Signed   By: Rolm Baptise M.D.   On: 09/11/2019 20:39   Scheduled Meds: . allopurinol  100 mg Oral Daily  . apixaban  5 mg Oral BID  . diazepam  2.5 mg Oral QHS  . feeding supplement (ENSURE ENLIVE)  237 mL Oral BID BM  . furosemide  20 mg Oral Daily  . mouth rinse  15 mL Mouth Rinse BID  . metoprolol succinate  100 mg Oral Daily  . pantoprazole  40 mg Oral Daily  . polyethylene glycol  17 g Oral BID  . potassium chloride  20 mEq Oral Daily  . senna-docusate  1 tablet Oral BID  . sodium chloride flush  5 mL Intracatheter Q8H   Continuous Infusions: . fluconazole (DIFLUCAN) IV 100 mg (09/12/19 1127)  . piperacillin-tazobactam (ZOSYN)  IV 3.375 g (09/12/19 1307)    LOS: 14 days    Kerney Elbe, DO [facility] PAGER is on [place]  If 7PM-7AM, please contact night-coverage [email or web address] Password [facility] 09/12/2019, 6:57 PM

## 2019-09-12 NOTE — Care Management Important Message (Signed)
Important Message  Patient Details IM Letter given to Sharren Bridge SW to present to the Patient Name: Kristen [name] MRN: [id number] Date of Birth: [birth year]   Medicare Important Message Given:  Yes     [name], [name] 09/12/2019, 12:53 PM

## 2019-09-12 NOTE — Evaluation (Signed)
Occupational Therapy Evaluation Patient Details Name: Kristen [name] MRN: [id number] DOB: [birth year] Today's Date: 09/12/2019    History of Present Illness Pt is s/p laparoscopic cholecystectomy on 08/22/2019 and then had biliary leak and no activity in the small bowel. Pt s/p biliary drain 10/23 and s/p ERCP 10/27. PMH: A fib, CHF, mixed anxiety and depressive disorder   Clinical Impression   This 78 year old female was admitted for the above. At baseline, she is independent and lives alone.  She was very motivated during evaluation and would benefit from SNF prior to home to regain her independence. She needed min A to stand and sidestep, but she does fatique easily. Pt needs up to total A for LB dressing and she will benefit from AE education. Will follow in acute setting with min guard to min A level goals     Follow Up Recommendations  SNF    Equipment Recommendations  3 in 1 bedside commode    Recommendations for Other Services       Precautions / Restrictions Precautions Precautions: Fall Precaution Comments: drain R side abdomen Restrictions Weight Bearing Restrictions: Yes RLE Weight Bearing: Weight bearing as tolerated LLE Weight Bearing: Weight bearing as tolerated      Mobility Bed Mobility         Supine to sit: Min guard;HOB elevated Sit to supine: Min assist;HOB elevated   General bed mobility comments: extra time.  Assist for legs when getting back to bed  Transfers   Equipment used: Rolling walker (2 wheeled)   Sit to Stand: Min assist         General transfer comment: assist to stand and stabilize. Sidestepped with min A to [facility] with small steps and steadying assistance    Balance                                           ADL either performed or assessed with clinical judgement   ADL Overall ADL's : Needs assistance/impaired Eating/Feeding: Independent   Grooming: Set up;Brushing hair;Sitting   Upper Body Bathing:  Minimal assistance   Lower Body Bathing: Maximal assistance   Upper Body Dressing : Minimal assistance   Lower Body Dressing: Total assistance                 General ADL Comments: sat EOB and performed level one theraband exercises as well as incentive spirometer.  Pt stood and sidestepped up [place], slowly, with small steps.  Very motivated     Museum/gallery curator      Pertinent Vitals/Pain Pain Assessment: Faces Faces Pain Scale: Hurts little more Pain Location: L side Pain Descriptors / Indicators: Discomfort;Sore;Aching Pain Intervention(s): Limited activity within patient's tolerance;Monitored during session;Repositioned     Hand Dominance     Extremity/Trunk Assessment Upper Extremity Assessment Upper Extremity Assessment: Generalized weakness(grossly 4-/5)           Communication Communication Communication: No difficulties   Cognition Arousal/Alertness: Awake/alert Behavior During Therapy: WFL for tasks assessed/performed Overall Cognitive Status: Within Functional Limits for tasks assessed                                     General Comments       Exercises  Exercises: Other exercises Other Exercises Other Exercises: level one theraband, horizontal ABD, FF and elbow flexion, 5 reps each side. Worked on incentive spirometer sitting EOB x 10   Shoulder Instructions      Home Living Family/patient expects to be discharged to:: Skilled nursing facility Living Arrangements: Alone                               Additional Comments: tub/shower with grab bars.  standard commode      Prior Functioning/Environment Level of Independence: Independent        Comments: dancer        OT Problem List: Decreased strength;Decreased activity tolerance;Impaired balance (sitting and/or standing);Decreased knowledge of use of DME or AE;Decreased knowledge of precautions;Pain      OT Treatment/Interventions:  Self-care/ADL training;Energy conservation;DME and/or AE instruction;Therapeutic exercise;Therapeutic activities;Patient/family education;Balance training    OT Goals(Current goals can be found in the care plan section) Acute Rehab OT Goals Patient Stated Goal: Go to rehab before she goes home. OT Goal Formulation: With patient Time For Goal Achievement: 09/26/19 Potential to Achieve Goals: Good ADL Goals Pt Will Perform Grooming: with supervision;standing Pt Will Transfer to Toilet: with min guard assist;ambulating;bedside commode Pt Will Perform Toileting - Clothing Manipulation and hygiene: with min assist;sit to/from stand Additional ADL Goal #1: pt will perform UB adls with set up and LB adls with min A using AE Additional ADL Goal #2: pt will be independent with written HEP with level one theraband to increase UE strength for adls  OT Frequency: Min 2X/week   Barriers to D/C:            Co-evaluation              AM-PAC OT "6 Clicks" Daily Activity     Outcome Measure Help from another person eating meals?: None Help from another person taking care of personal grooming?: A Little Help from another person toileting, which includes using toliet, bedpan, or urinal?: A Lot Help from another person bathing (including washing, rinsing, drying)?: A Lot Help from another person to put on and taking off regular upper body clothing?: A Little Help from another person to put on and taking off regular lower body clothing?: Total 6 Click Score: 15   End of Session    Activity Tolerance: Patient tolerated treatment well Patient left: in bed;with call bell/phone within reach;with bed alarm set  OT Visit Diagnosis: Unsteadiness on feet (R26.81);Muscle weakness (generalized) (M62.81)                Time: 1517-6160 OT Time Calculation (min): 36 min Charges:  OT General Charges $OT Visit: 1 Visit OT Evaluation $OT Eval Moderate Complexity: 1 Mod OT Treatments $Therapeutic  Exercise: 8-22 mins  Lesle Chris, OTR/L Acute Rehabilitation Services [phone] [facility] pager [phone] office 09/12/2019  [place] 09/12/2019, 12:55 PM

## 2019-09-12 NOTE — Progress Notes (Signed)
Referring Physician(s): Arta Silence  Supervising Physician: Aletta Edouard  Patient Status:  Kristen [name] - In-pt  Chief Complaint: "What did my scan from yesterday show?"  Subjective:  Patient with history of calculous cholecystitis s/p laparoscopic cholecystostomy in OR 08/22/2019 by Dr. Kieth Brightly, with subsequent development of bile leak and gallbladder fossa fluid collection development s/p gallbladder fossa drain placement in IR 08/30/2019 by Dr. Laurence Ferrari. Patient awake and alert sitting in bed working with PT with no complaints at this time. She asks what the results of her CXR from last evening were. Discussed results with her, and informed patient that managing results is up to [facility]. Gallbladder fossa drain site c/d/i.   Allergies: Flecainide acetate  Medications: Prior to Admission medications   Medication Sig Start Date End Date Taking? Authorizing Provider  allopurinol (ZYLOPRIM) 100 MG tablet TAKE 1 TABLET BY MOUTH EVERY DAY Patient taking differently: Take 100 mg by mouth daily.  06/24/19  Yes [name], MD  calcium carbonate (OSCAL) 1500 (600 Ca) MG TABS tablet Take 1,500 mg by mouth daily.    Yes [provider]  calcium carbonate (TUMS - DOSED IN MG ELEMENTAL CALCIUM) 500 MG chewable tablet Chew 1 tablet by mouth as needed for indigestion or heartburn.   Yes [provider]  cholecalciferol (VITAMIN D3) 25 MCG (1000 UT) tablet Take 1,000 Units by mouth daily.   Yes [provider]  CINNAMON PO Take 1 tablet by mouth daily.    Yes [provider]  clindamycin (CLINDAGEL) 1 % gel APPLY TO AFFECTED AREA TWICE A DAY Patient taking differently: Apply 1 application topically 2 (two) times daily.  04/13/18  Yes [name], MD  colchicine 0.6 MG tablet Take 1 tablet (0.6 mg total) by mouth 2 (two) times daily. Take twice daily until pain resolves and then take as needed for gout flare. Patient taking differently: Take 0.6 mg by  mouth 2 (two) times daily as needed. Take twice daily until pain resolves and then take as needed for gout flare. 06/15/18  Yes [name], MD  [employer] ANTI-FUNGAL 2 % powder APPLY TO AFFECTED AREA TWICE A DAY AFTER SKIN RESOLVES FROM KETOCONAZOLE CREAM Patient taking differently: as needed.  10/17/14  Yes [name], MD  diazepam (VALIUM) 5 MG tablet TAKE 1/2 TABLET (2.5 MG TOTAL) BY MOUTH AT BEDTIME. Patient taking differently: Take 2.5 mg by mouth at bedtime.  07/08/19  Yes [name], MD  doxycycline (VIBRAMYCIN) 50 MG capsule TAKE 2 CAPSULES BY MOUTH 2 (TWO) TIMES DAILY. Patient taking differently: Take 100 mg by mouth See admin instructions. Twice daily As directed for deer tick's (allergic reaction) 04/25/18  Yes [name], MD  ELIQUIS 5 MG TABS tablet TAKE 1 TABLET BY MOUTH TWICE A DAY Patient taking differently: Take 5 mg by mouth 2 (two) times daily.  05/23/19  Yes Park Liter, MD  Flax Oil-Fish Oil-Borage Oil (FISH-FLAX-BORAGE) CAPS Take 1 capsule by mouth daily.   Yes [provider]  furosemide (LASIX) 20 MG tablet Take 1 tablet (20 mg total) by mouth daily. Patient taking differently: Take 20 mg by mouth daily.  08/19/19  Yes Park Liter, MD  glucosamine-chondroitin (GLUCOSAMINE-CHONDROITIN DS) 500-400 MG tablet Take 1 tablet by mouth daily.    Yes [provider]  HYDROcodone-acetaminophen (NORCO/VICODIN) 5-325 MG tablet Take 1 tablet by mouth every 6 (six) hours as needed for moderate pain. 08/22/19  Yes [name], [name], MD  ibuprofen (ADVIL) 800 MG  tablet Take 1 tablet (800 mg total) by mouth every 8 (eight) hours as needed. 08/22/19  Yes [name], [name], MD  lisinopril-hydrochlorothiazide (ZESTORETIC) 20-25 MG tablet Take 1 tablet by mouth daily. Patient taking differently: Take 1 tablet by mouth daily.  03/08/19  Yes [name], MD  loratadine (CLARITIN) 10 MG tablet Take 1 tablet (10 mg total) by mouth daily. 07/08/19   Yes [name], MD  metoprolol succinate (TOPROL-XL) 100 MG 24 hr tablet Take 1 tablet (100 mg total) by mouth daily. Take with or immediately following a meal. Patient taking differently: Take 100 mg by mouth daily. Take with or immediately following a meal. 01/29/19 08/28/19 Yes Park Liter, MD  Multiple Vitamins-Minerals (EMERGEN-C IMMUNE) PACK Take 1 tablet by mouth daily.    Yes [provider]  Multiple Vitamins-Minerals (MULTIVITAMIN WOMEN 50+) TABS Take 1 tablet by mouth daily.    Yes [provider]  mupirocin ointment (BACTROBAN) 2 % APPLY TOPICALLY TO AFFECTED AREA TWICE DAILY Patient taking differently: as needed.  02/20/19  Yes [name], MD  Nutritional Supplements (ECHINACEA/GOLDEN SEAL PO) Take 1 tablet by mouth daily.    Yes [provider]  polycarbophil (FIBERCON) 625 MG tablet Take 625 mg by mouth daily.   Yes [provider]  potassium chloride (KLOR-CON) 20 MEQ packet MIX 1 PACKET IN LIQUID AND TAKE BY MOUTH DAILY AS DIRECTED Patient taking differently: Take 20 mEq by mouth daily. MIX 1 PACKET IN LIQUID AND TAKE BY MOUTH DAILY AS DIRECTED 02/20/19  Yes [name], MD  Probiotic Product (PROBIOTIC-10 PO) Take 1 tablet by mouth daily.    Yes [provider]  TURMERIC PO Take 1 tablet by mouth daily.    Yes [provider]  alendronate (FOSAMAX) 10 MG tablet TAKE (1) TABLET BY MOUTH PER WEEK TAKE ON EMPTY STOMACH WITH FULL GLASS OF WATER 09/11/19   [name], MD     Vital Signs: BP (!) 119/58 (BP Location: Left Arm)   Pulse 60   Temp 97.7 F (36.5 C) (Oral)   Resp 16   Ht 5\' 2"  (1.575 m)   Wt 202 lb 6.1 oz (91.8 kg)   SpO2 97%   BMI 37.02 kg/m   Physical Exam Vitals signs and nursing note reviewed.  Constitutional:      General: She is not in acute distress.    Appearance: Normal appearance.  Pulmonary:     Effort: Pulmonary effort is normal. No respiratory distress.  Abdominal:      Palpations: Abdomen is soft.     Tenderness: There is no abdominal tenderness.     Comments: Gallbladder fossa drain site without tenderness, erythema, drainage, or active bleeding; approximately 10 cc of clear yellow fluid in gravity bag.  Skin:    General: Skin is warm and dry.  Neurological:     Mental Status: She is alert and oriented to person, place, and time.  Psychiatric:        Mood and Affect: Mood normal.        Behavior: Behavior normal.        Thought Content: Thought content normal.        Judgment: Judgment normal.     Imaging: Ct Abdomen Wo Contrast  Result Date: 09/10/2019 CLINICAL DATA:  Status post laparoscopic cholecystectomy on 08/22/2019, with gallbladder fossa fluid collection/biloma, status post strain on 10/23. EXAM: CT ABDOMEN WITHOUT CONTRAST TECHNIQUE: Multidetector CT imaging of the abdomen was performed following the standard protocol without  IV contrast. COMPARISON:  08/29/2019 FINDINGS: Lower chest: Small bilateral pleural effusions. Associated lower lobe atelectasis. Hyperdense blood pool relative to myocardium, suggesting anemia. Hepatobiliary: Unenhanced liver is grossly unremarkable. Status post cholecystectomy. No intrahepatic or extrahepatic ductal dilatation. Indwelling common duct stent with associated pneumobilia. Percutaneous pigtail drain in the gallbladder fossa. No residual fluid collection/abscess. Trace stranding with to foci of gas in the gallbladder fossa (series 2/images 28-29). Pancreas: Within normal limits. Spleen: Within normal limits. Adrenals/Urinary Tract: Adrenal glands are within normal limits. Kidneys are within normal limits. Bilateral extrarenal pelvis, right greater than left. Stomach/Bowel: Stomach and visualized bowel are unremarkable. Vascular/Lymphatic: No evidence of abdominal aortic aneurysm. Atherosclerotic calcifications of the abdominal aorta and branch vessels. No suspicious abdominal lymphadenopathy. Other: No abdominal  ascites. Musculoskeletal: Degenerative changes of the lower thoracic spine. IMPRESSION: Percutaneous pigtail drain in the gallbladder fossa. No residual fluid collection/abscess. Status post cholecystectomy. Indwelling common duct stent with pneumobilia. Small pleural effusions.  Associated lower atelectasis. Electronically Signed   By: Julian Hy M.D.   On: 09/10/2019 11:10   Ir Sinus/fist Tube Chk-non Gi  Result Date: 09/11/2019 CLINICAL DATA:  History of laparoscopic cholecystectomy complicated by development of bile leak post percutaneous drainage catheter placement into gallbladder fossa fluid collection on 08/30/2019 (Dr. Laurence Ferrari). Additionally, patient has undergone ERCP with internal biliary stent placement on 09/03/2019. Subsequent CT scan the abdomen pelvis performed earlier today demonstrates complete resolution the gallbladder fossa fluid collection and as such patient now presents for drainage catheter injection prior to potential removal. EXAM: FLUOROSCOPIC GUIDED DRAIN INJECTION COMPARISON:  CT abdomen pelvis-earlier same day; 08/29/2019; CT-guided percutaneous drainage catheter placement-08/30/2019; ERCP-09/03/2019 CONTRAST:  63mL OMNIPAQUE IOHEXOL 300 MG/ML SOLN-administered via the existing percutaneous drainage catheter FLUOROSCOPY TIME:  1 minutes, 18 seconds (69 mGy) TECHNIQUE: The patient was positioned supine on the fluoroscopy table. A preprocedural spot fluoroscopic image was obtained of the right upper abdominal quadrant and existing percutaneous drainage catheter Multiple spot fluoroscopic and radiographic images were obtained following the injection of a small amount of contrast via the existing percutaneous drainage catheter. Images reviewed and the drainage catheter was flushed with a small amount of saline. The drainage catheter was converted from a JP bulb to a gravity bag. A dressing was applied. The patient tolerated the procedure well without immediate postprocedural  complication. FINDINGS: Preprocedural spot fluoroscopic image demonstrates unchanged positioning percutaneous drainage catheter with end coiled and locked over the lying the expected location the gallbladder fossa. An internal plastic biliary stent overlies expected location of the CBD Contrast injection demonstrates opacification of the decompressed gallbladder fossa fluid collection with persistent communication to the biliary tree though to the expected location of the cystic duct remnant, rather to a division of the right biliary tree. There is passage of contrast through the CBD and biliary stent to the level of the duodenum. There is minimal opacification of the cystic duct remnant without definitive evidence of contrast extravasation from the cystic duct remnant. IMPRESSION: 1. Fluoroscopic guided drainage catheter injection demonstrates findings worrisome for bile leak from a division of the right biliary tree rather than the cystic duct remnant. 2. Widely patent and appropriately positioned internal biliary stent. Above findings discussed with Dr. Kieth Brightly on 09/11/2019. PLAN: - The patient drainage catheter was converted from a JP bulb to a gravity bag. - The patient was instructed to not flush the percutaneous drainage catheter however to maintain diligent records regarding drainage catheter output. - Repeat drain injection may be performed in 2 weeks as deemed  appropriate by Dr. Kieth Brightly. Electronically Signed   By: Sandi Mariscal M.D.   On: 09/11/2019 15:40   Dg Chest Port 1 View  Result Date: 09/11/2019 CLINICAL DATA:  Fever EXAM: PORTABLE CHEST 1 VIEW COMPARISON:  09/06/2019 FINDINGS: Cardiomegaly with vascular congestion. Left lower lobe atelectasis or infiltrate. No confluent opacity on the right. No effusions or acute bony abnormality. IMPRESSION: Cardiomegaly, vascular congestion. Left lower lobe atelectasis or infiltrate. Electronically Signed   By: Rolm Baptise M.D.   On: 09/11/2019 20:39     Labs:  CBC: Recent Labs    09/09/19 0526 09/10/19 0522 09/11/19 0459 09/12/19 0629  WBC 9.6 8.7 18.4* 15.2*  HGB 10.0* 10.1* 10.5* 10.2*  HCT 31.2* 31.1* 31.9* 31.9*  PLT 582* 630* 667* 658*    COAGS: Recent Labs    08/29/19 1805 08/31/19 1226  INR 1.1 1.1  APTT  --  32    BMP: Recent Labs    09/08/19 0552 09/10/19 0522 09/11/19 0459 09/12/19 0629  NA 137 137 135 137  K 3.4* 3.4* 3.9 3.7  CL 97* 100 100 96*  CO2 29 27 26 31   GLUCOSE 96 95 117* 103*  BUN 19 19 22 19   CALCIUM 8.6* 9.0 8.8* 8.9  CREATININE 1.13* 0.82 0.93 0.95  GFRNONAA 46* >60 58* 57*  GFRAA 54* >60 >60 >60    LIVER FUNCTION TESTS: Recent Labs    09/04/19 0858 09/05/19 0202 09/07/19 0529 09/08/19 0552 09/12/19 0629  BILITOT 0.7 1.0 1.2  --  0.7  AST 35 38 38  --  21  ALT 23 25 31   --  19  ALKPHOS 103 113 99  --  92  PROT 6.4* 6.5 6.2*  --  6.9  ALBUMIN 2.7* 2.7* 2.6* 2.4* 2.6*    Assessment and Plan:  Patient with history of calculous cholecystitis s/p laparoscopic cholecystostomy in OR 08/22/2019 by Dr. Kieth Brightly complicated by development of bile leak and gallbladder fossa fluid collection development s/p gallbladder fossa drain placement in IR 08/30/2019 by Dr. Laurence Ferrari. Gallbladder fossa drain stable with approximately 10 cc of clear yellow fluid in gravity bag. Patient had drain injection in IR 09/10/2019 which revealed findings worrisome for bile leak from a division of the right biliary tree rather than cystic duct remnant. Because of this, it is recommended by Dr. Pascal Lux that drain to remain, discontinue flushes at this time, and plan for possible repeat drain injection in 1-2 weeks. Further plans per TRH/CCS- appreciate and agree with management. IR to follow.   Electronically Signed: Earley Abide, PA-C 09/12/2019, 11:32 AM   I spent a total of 25 Minutes at the the patient's bedside AND on the patient's hospital floor or unit, greater than 50% of which was  counseling/coordinating care for gallbladder fossa fluid collection s/p drain placement.

## 2019-09-13 ENCOUNTER — Inpatient Hospital Stay (HOSPITAL_COMMUNITY): Payer: Medicare Other

## 2019-09-13 LAB — COMPREHENSIVE METABOLIC PANEL
ALT: 15 U/L (ref 0–44)
AST: 20 U/L (ref 15–41)
Albumin: 2.6 g/dL — ABNORMAL LOW (ref 3.5–5.0)
Alkaline Phosphatase: 90 U/L (ref 38–126)
Anion gap: 11 (ref 5–15)
BUN: 21 mg/dL (ref 8–23)
CO2: 29 mmol/L (ref 22–32)
Calcium: 8.8 mg/dL — ABNORMAL LOW (ref 8.9–10.3)
Chloride: 95 mmol/L — ABNORMAL LOW (ref 98–111)
Creatinine, Ser: 0.94 mg/dL (ref 0.44–1.00)
GFR calc Af Amer: >60 mL/min (ref >60)
GFR calc non Af Amer: 58 mL/min — ABNORMAL LOW (ref >60)
Glucose, Bld: 101 mg/dL — ABNORMAL HIGH (ref 70–99)
Potassium: 3.9 mmol/L (ref 3.5–5.1)
Sodium: 135 mmol/L (ref 135–145)
Total Bilirubin: 1 mg/dL (ref 0.3–1.2)
Total Protein: 6.5 g/dL (ref 6.5–8.1)

## 2019-09-13 LAB — CBC WITH DIFFERENTIAL/PLATELET
Abs Immature Granulocytes: 0.07 10*3/uL (ref 0.00–0.07)
Basophils Absolute: 0.1 10*3/uL (ref 0.0–0.1)
Basophils Relative: 1 %
Eosinophils Absolute: 1.8 10*3/uL — ABNORMAL HIGH (ref 0.0–0.5)
Eosinophils Relative: 17 %
HCT: 30.1 % — ABNORMAL LOW (ref 36.0–46.0)
Hemoglobin: 9.4 g/dL — ABNORMAL LOW (ref 12.0–15.0)
Immature Granulocytes: 1 %
Lymphocytes Relative: 15 %
Lymphs Abs: 1.6 10*3/uL (ref 0.7–4.0)
MCH: 30.2 pg (ref 26.0–34.0)
MCHC: 31.2 g/dL (ref 30.0–36.0)
MCV: 96.8 fL (ref 80.0–100.0)
Monocytes Absolute: 1 10*3/uL (ref 0.1–1.0)
Monocytes Relative: 9 %
Neutro Abs: 5.8 10*3/uL (ref 1.7–7.7)
Neutrophils Relative %: 57 %
Platelets: 640 10*3/uL — ABNORMAL HIGH (ref 150–400)
RBC: 3.11 MIL/uL — ABNORMAL LOW (ref 3.87–5.11)
RDW: 13.4 % (ref 11.5–15.5)
WBC: 10.3 10*3/uL (ref 4.0–10.5)
nRBC: 0 % (ref 0.0–0.2)

## 2019-09-13 LAB — PHOSPHORUS: Phosphorus: 4 mg/dL (ref 2.5–4.6)

## 2019-09-13 LAB — MAGNESIUM: Magnesium: 2.1 mg/dL (ref 1.7–2.4)

## 2019-09-13 MED ORDER — GUAIFENESIN ER 600 MG PO TB12
1200.0000 mg | ORAL_TABLET | Freq: Two times a day (BID) | ORAL | Status: DC
  Administered 2019-09-13 – 2019-09-17 (×8): 1200 mg via ORAL
  Filled 2019-09-13 (×8): qty 2

## 2019-09-13 NOTE — Progress Notes (Signed)
Physical Therapy Treatment Patient Details Name: Kristen [name] MRN: [id number] DOB: October 01, 1941 Today's Date: 09/13/2019    History of Present Illness Pt is s/p laparoscopic cholecystectomy on 08/22/2019 and then had biliary leak and no activity in the small bowel. Pt s/p biliary drain 10/23 and s/p ERCP 10/27. PMH: A fib, CHF, mixed anxiety and depressive disorder    PT Comments    Patient received in bed, very motivated to participate in therapy today. Able to perform bed mobility with MinA, functional transfers with MinA and RW, and pivot to chair with RW and ModA for balance and device management. Once in chair continued to work on functional strengthening for B UEs/LEs. She was positioned to comfort and left up in the chair with all needs met, chair alarm active this afternoon.     Follow Up Recommendations  SNF     Equipment Recommendations  None recommended by PT    Recommendations for Other Services       Precautions / Restrictions Precautions Precautions: Fall Precaution Comments: drain R side abdomen Restrictions Weight Bearing Restrictions: Yes RLE Weight Bearing: Weight bearing as tolerated LLE Weight Bearing: Weight bearing as tolerated    Mobility  Bed Mobility Overal bed mobility: Needs Assistance Bed Mobility: Supine to Sit     Supine to sit: Min assist     General bed mobility comments: MinA to rotate hips to EOB and scoot forward  Transfers Overall transfer level: Needs assistance Equipment used: Rolling walker (2 wheeled) Transfers: Sit to/from [facility] Sit to Stand: Min assist Stand pivot transfers: Mod assist       General transfer comment: MinA to boost to full standing position, ModA for balance and RW management for pivot into chair  Ambulation/Gait             General Gait Details: deferred, fatigue   Stairs             Wheelchair Mobility    Modified Rankin (Stroke Patients Only)        Balance Overall balance assessment: Needs assistance Sitting-balance support: Feet supported;Bilateral upper extremity supported Sitting balance-Leahy Scale: Good     Standing balance support: Bilateral upper extremity supported Standing balance-Leahy Scale: Poor Standing balance comment: requires UE support                            Cognition Arousal/Alertness: Awake/alert Behavior During Therapy: WFL for tasks assessed/performed Overall Cognitive Status: Within Functional Limits for tasks assessed                                 General Comments: very pleasant and cooperative      Exercises Other Exercises Other Exercises: ankle pumps 1x20, LAQs 1x10 B, manually resisted seated clams 1x10, wrist flexion/extension 1x20 bicep curls 1x10 B, shoulder flexion 1x10 B    General Comments        Pertinent Vitals/Pain Pain Assessment: Faces Faces Pain Scale: Hurts little more Pain Location: L side and back Pain Descriptors / Indicators: Discomfort;Sore;Aching Pain Intervention(s): Limited activity within patient's tolerance;Monitored during session;Repositioned    Home Living                      Prior Function            PT Goals (current goals can now be found in the care plan section) Acute  Rehab PT Goals Patient Stated Goal: Go to rehab before she goes home. PT Goal Formulation: With patient Time For Goal Achievement: 09/20/19 Potential to Achieve Goals: Good Progress towards PT goals: Progressing toward goals    Frequency    Min 2X/week      PT Plan Current plan remains appropriate    Co-evaluation              AM-PAC PT "6 Clicks" Mobility   Outcome Measure  Help needed turning from your back to your side while in a flat bed without using bedrails?: A Little Help needed moving from lying on your back to sitting on the side of a flat bed without using bedrails?: A Little Help needed moving to and from a bed to a  chair (including a wheelchair)?: A Little Help needed standing up from a chair using your arms (e.g., wheelchair or bedside chair)?: A Little Help needed to walk in hospital room?: A Lot Help needed climbing 3-5 steps with a railing? : Total 6 Click Score: 15    End of Session Equipment Utilized During Treatment: Gait belt Activity Tolerance: Patient limited by fatigue Patient left: in chair;with call bell/phone within reach;with chair alarm set   PT Visit Diagnosis: Muscle weakness (generalized) (M62.81);Pain;Difficulty in walking, not elsewhere classified (R26.2) Pain - Right/Left: Right Pain - part of body: Knee     Time: 4353-9122 PT Time Calculation (min) (ACUTE ONLY): 27 min  Charges:  $Therapeutic Exercise: 8-22 mins $Therapeutic Activity: 8-22 mins                     Windell [place], DPT, CBIS  Supplemental Physical Therapist [place]    Pager [phone] Acute Rehab Office [phone]

## 2019-09-13 NOTE — TOC Progression Note (Signed)
Transition of Care [facility]) - Progression Note    Patient Details  Name: Kristen [name] MRN: [id number] Date of Birth: January 29, 1941  Transition of Care [facility] CM/SW [place], [place] Phone Number: 09/13/2019, 2:52 PM  Clinical Narrative:   Darrol Angel to report pt not d/cing today. She will require a new authorization process when we know of d/c date.     Expected Discharge Plan: Skilled Nursing Facility Barriers to Discharge: Insurance Authorization  Expected Discharge Plan and Services Expected Discharge Plan: [facility] In-house Referral: Clinical Social Work     Living arrangements for the past 2 months: Mobile Home                                       Social Determinants of Health (SDOH) Interventions    Readmission Risk Interventions No flowsheet data found.

## 2019-09-13 NOTE — Progress Notes (Signed)
PROGRESS NOTE    Kristen [name]  GMW:[id number] DOB: 17-May-1941 DOA: 08/28/2019 PCP: [name], MD    Brief Narrative:  The patient is a 78 year old female with a history of anxiety, atrial fibrillation on anticoagulation with Eliquis, hypertension, CKD uncertain baseline who came to ED with abdominal pain.  Patient recently underwent laparoscopic cholecystectomy on 08/22/2019 and was recovering uneventfully back at home but started developing cervical pain of right upper quadrant with radiation towards right shoulder blade in the ED CT abdomen pelvis revealed fluid collection in the gallbladder fossa.  General surgery was consulted HIDA scan was done which confirmed biliary leak and no activity in the small bowel.  Patient underwent gallbladder fossa drain placement per IR and ERCP with biliary duct stone removal and stent placement per GI.  CT scan done yesterday showed no residual collection but bile collection did show Candida and patient was started on antifungals.  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  IR recommending continuing maintaining drain for now and repeating follow-up injection in 1 week.  Because she spiked a temperature and had an elevated leukocytosis blood cultures were obtained and she is started on empiric antibiotics and now she is afebrile and white blood cell count is trending down.  Likely the patient has a left-sided pneumonia and question if this is from aspiration..  Patient continues to feel constipated so bowel regimen was added  Assessment & Plan:   Principal Problem:   Acute renal failure (ARF) (HCC) Active Problems:   Hypertensive heart disease   Permanent atrial fibrillation (HCC)   Acute on chronic diastolic CHF (congestive  heart failure) (HCC)   Situational mixed anxiety and depressive disorder   Intraabdominal fluid collection   A-fib (HCC)   Right Upper Quadrant pain/Biliary Leak -HIDA scan suggested biliary leak after patient presents with right upper quadrant pain. -CT showed fluid collection in the gallbladder fossa.   -Biliary drain placement per IR yielded significant amount of fluid.   -ERCP showed choledocholithiasis, underwent removal of stone and stent placement.  GI has signed off.   -Patient was empirically started on IV Zosyn, and had completed 10 days of IV Zosyn but will resume now given fevers and worsened WBC.   -Repeat CT abdomen today shows percutaneous pigtail drain in gallbladder fossa, no residual fluid collection/abscess but she did spike a temperature yesterday -IR Recommending continuing Drain for at least another week -Bile showed Candida Parapsilosis  -Pan Cx as below and likely patient has a pneumonia -Blood cultures x2 and fungus culture showed no growth to date less than 24 hours and urine culture was negative -IR recommending continuing the drain and discontinuing drain flushes and repeating drain injection 1 to 2 weeks  Post ERCP Pancreatitis -patient developed post ERCP pancreatitis with lipase to 732 which improved to 92. -Her diet has been advanced to solid food which she has been tolerating.    Acute Respiratory Failure with Hypoxia in the setting of CHF and LLL PNA Acute on Chronic Diastolic CHF -Patient developed acute on chronic diastolic CHF exacerbation, secondary to fluid overload.   -Echocardiogram showed EF 60 to 65% improved with IV Lasix.   -Currently she is on Lasix 20 mg p.o. daily.   -She is on 2 L of oxygen via nasal cannula.  Continue to wean off oxygen  as able.   -Repeat CXR this AM showed  -Strict I's/O's and Daily Weights; Patient is -3.766 Liters  -Continue to Monitor Respirator Status Carefully  A. Fib with RVR -CHA2DS2VASc score of at least 5  patient went into A. fib in the ED.  -She was started on Cardizem drip which has been stopped.   -She is on Eliquis for anticoagulation.   -Continue Metoprolol Succinate 100 mg po Daily for rate control. -No longer on Telemetry   Hypertension -blood pressure is stable but on the lower side and was 105/48  Hypokalemia -Potassium was 3.4 and improved to 3.9 -In the setting of Diuresis -Continue to Monitor and Replete as Necessary -Repeat CMP in AM   GERD -C/w PPI  Depression and Anxiety -C/w Diazepam 2.5 mg po qHS   Fever and Leukocytosis, improving and likely in the setting of Healthcare associated LLL pneumonia -Pan Culture -Had a T-max of 101.8 the day before yesterday and WBC went from 8.7 2 18.4 and worsened; today it is 10.3 after restarting IV Zosyn -Obtain Blood Cx x2 and showed no growth to date at 2 Days,  -Also checked urinalysis and urine culture as well as chest x-ray; UA showed a hazy appearance with large hemoglobin, rare bacteria, 6-10 RBCs per high-power field, 6-10 WBCs and urine culture showed multiple species -CXR yesterday showed "Cardiomegaly, vascular congestion. Left lower lobe atelectasis or infiltrate" and today it showed "Cardiomediastinal contours remain enlarged. Signs of left lower lobe airspace disease as before. No dense consolidation. No acute bone finding." -Fungus culture showing no growth to date at 2 Days -CT of the abdomen pelvis done yesterday showed "Percutaneous pigtail drain in the gallbladder fossa. No residual fluid collection/abscess. Status post cholecystectomy. Indwelling common duct stent with pneumobilia.Small pleural effusions.  Associated lower atelectasis." -Resumed with Zosyn and temperature is improved and leukocytosis is trending down -Continue to Monitor and Trend Temperature Curve and WBC -Repeat CBC in AM and follow Cx's  -Added DuonEb 3 mL q6hprn Wheezing and SOB -Added Guaifenesin 1200 mg po BID, and Will continue Flutter  Valve and Incentive Spirometry   Obesity -Estimated body mass index is 37.58 kg/m as calculated from the following:   Height as of this encounter: 5\' 2"  (1.575 m).   Weight as of this encounter: 93.2 kg. -Weight Loss and Dietary Counseling given   Constipation -C/w Miralax 17 grams BID -C/w Senna-Docusate 1 tab po BID for now  DVT prophylaxis: Anticoagulated with Apixaban 5 mg po BID  Code Status: FULL CODE  Family Communication: No family  Disposition Plan: Pending Clinical Improvement and Clearance by General Surgery and IR   Consultants:  -General Surgery -Gastroenterology -Interventional Radiology   Procedures:  Pt s/p lap chole 10/15; now with GB fossa fluid collection/biloma, s/p drain placement 10/23;s/p ERCP with removal of CBD stone/ CBD stent 10/27  Drain Injection 09/10/2019 demonstrated persistent communication btwn decompressed GB fossa fluid collection and biliary tree with 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; JP bulb converted to gravity bag  Antimicrobials:  Anti-infectives (From admission, onward)   Start     Dose/Rate Route Frequency Ordered Stop   09/11/19 1200  piperacillin-tazobactam (ZOSYN) IVPB 3.375 g     3.375 g 12.5 mL/hr over 240 Minutes Intravenous Every 8 hours 09/11/19 1129     09/10/19 1200  fluconazole (DIFLUCAN) IVPB 100 mg     100 mg 50 mL/hr over 60 Minutes Intravenous Every 24 hours  09/10/19 1033     08/31/19 1200  piperacillin-tazobactam (ZOSYN) IVPB 3.375 g  Status:  Discontinued     3.375 g 12.5 mL/hr over 240 Minutes Intravenous Every 8 hours 08/31/19 0743 09/08/19 1456   08/30/19 0400  piperacillin-tazobactam (ZOSYN) IVPB 2.25 g  Status:  Discontinued     2.25 g 100 mL/hr over 30 Minutes Intravenous Every 8 hours 08/29/19 1747 08/31/19 0743   08/29/19 1800  piperacillin-tazobactam (ZOSYN) IVPB 3.375 g     3.375 g 100 mL/hr over 30 Minutes Intravenous  Once  08/29/19 1745 08/29/19 2043     Subjective: Seen and examined at bedside   Objective: Vitals:   09/13/19 0500 09/13/19 0516 09/13/19 1036 09/13/19 1422  BP:  (!) 113/40 114/71 (!) 105/48  Pulse:  84 82 70  Resp:  16  18  Temp:  97.9 F (36.6 C)  98.3 F (36.8 C)  TempSrc:    Oral  SpO2:  96%  99%  Weight: 93.2 kg     Height:        Intake/Output Summary (Last 24 hours) at 09/13/2019 1609 Last data filed at 09/13/2019 0500 Gross per 24 hour  Intake -  Output 1310 ml  Net -1310 ml   Filed Weights   09/10/19 0500 09/12/19 0500 09/13/19 0500  Weight: 90.2 kg 91.8 kg 93.2 kg   Examination: Physical Exam:  Constitutional: WN/WD obese Caucasian female in NAD and appears slightly anxious Eyes: Lids and conjunctivae normal, sclerae anicteric  ENMT: External Ears, Nose appear normal. Grossly normal hearing. Mucous membranes are moist.  Neck: Appears normal, supple, no cervical masses, normal ROM, no appreciable thyromegaly; no JVD Respiratory: Diminished to auscultation bilaterally worse on the Left; no wheezing, rales, rhonchi or crackles. Normal respiratory effort and patient is not tachypenic. No accessory muscle use. Unlabored breathing  Cardiovascular: Irregularly Irregular, no murmurs / rubs / gallops. S1 and S2 auscultated. Trace extremity edema.  Abdomen: Soft, non-tender, Distended. RUQ Drain in place. Bowel sounds positive x4.  GU: Deferred. Musculoskeletal: No clubbing / cyanosis of digits/nails. No joint deformity upper and lower extremities.  Skin: No rashes, lesions, ulcers on a limited skin evaluation. No induration; Warm and dry.  Neurologic: CN 2-12 grossly intact with no focal deficits. Romberg sign and cerebellar reflexes not assessed.  Psychiatric: Normal judgment and insight. Alert and oriented x 3. Anxious mood and appropriate affect.   Data Reviewed: I have personally reviewed following labs and imaging studies  CBC: Recent Labs  Lab 09/09/19 0526  09/10/19 0522 09/11/19 0459 09/12/19 0629 09/13/19 0538  WBC 9.6 8.7 18.4* 15.2* 10.3  NEUTROABS  --   --   --  10.3* 5.8  HGB 10.0* 10.1* 10.5* 10.2* 9.4*  HCT 31.2* 31.1* 31.9* 31.9* 30.1*  MCV 97.5 96.3 95.5 97.9 96.8  PLT 582* 630* 667* 658* 073*   Basic Metabolic Panel: Recent Labs  Lab 09/08/19 0552 09/10/19 0522 09/11/19 0459 09/12/19 0629 09/13/19 0538  NA 137 137 135 137 135  K 3.4* 3.4* 3.9 3.7 3.9  CL 97* 100 100 96* 95*  CO2 29 27 26 31 29   GLUCOSE 96 95 117* 103* 101*  BUN 19 19 22 19 21   CREATININE 1.13* 0.82 0.93 0.95 0.94  CALCIUM 8.6* 9.0 8.8* 8.9 8.8*  MG  --   --   --  2.2 2.1  PHOS 2.9  --   --  3.5 4.0   GFR: Estimated Creatinine Clearance: 51.6 mL/min (by C-G formula based on  SCr of 0.94 mg/dL). Liver Function Tests: Recent Labs  Lab 09/07/19 0529 09/08/19 0552 09/12/19 0629 09/13/19 0538  AST 38  --  21 20  ALT 31  --  19 15  ALKPHOS 99  --  92 90  BILITOT 1.2  --  0.7 1.0  PROT 6.2*  --  6.9 6.5  ALBUMIN 2.6* 2.4* 2.6* 2.6*   Recent Labs  Lab 09/08/19 0552  LIPASE 92*   No results for input(s): AMMONIA in the last 168 hours. Coagulation Profile: No results for input(s): INR, PROTIME in the last 168 hours. Cardiac Enzymes: No results for input(s): CKTOTAL, CKMB, CKMBINDEX, TROPONINI in the last 168 hours. BNP (last 3 results) No results for input(s): PROBNP in the last 8760 hours. HbA1C: No results for input(s): HGBA1C in the last 72 hours. CBG: No results for input(s): GLUCAP in the last 168 hours. Lipid Profile: No results for input(s): CHOL, HDL, LDLCALC, TRIG, CHOLHDL, LDLDIRECT in the last 72 hours. Thyroid Function Tests: No results for input(s): TSH, T4TOTAL, FREET4, T3FREE, THYROIDAB in the last 72 hours. Anemia Panel: No results for input(s): VITAMINB12, FOLATE, FERRITIN, TIBC, IRON, RETICCTPCT in the last 72 hours. Sepsis Labs: No results for input(s): PROCALCITON, LATICACIDVEN in the last 168 hours.  Recent  Results (from the past 240 hour(s))  Body fluid culture     Status: None   Collection Time: 09/06/19  2:58 PM   Specimen: BILE; Body Fluid  Result Value Ref Range Status   Specimen Description   Final    BILE Performed at [facility] [address], [place], [place] [address]    Special Requests   Final    Normal Performed at [facility], [place] [address], [place], [place] [address]    Gram Stain   Final    NO WBC SEEN ABUNDANT YEAST Performed at [facility], [place] [address], [place], [place] [address]    Culture ABUNDANT CANDIDA PARAPSILOSIS  Final   Report Status 09/08/2019 FINAL  Final  Fungus culture, blood     Status: None (Preliminary result)   Collection Time: 09/11/19 12:09 PM   Specimen: BLOOD  Result Value Ref Range Status   Specimen Description   Final    BLOOD LEFT ARM Performed at [facility] [address] [place], [place] [address]    Special Requests   Final    BOTTLES DRAWN AEROBIC ONLY Blood Culture adequate volume Performed at [facility] [address] [place], [place] [address]    Culture   Final    NO GROWTH 2 DAYS Performed at [facility] [address] [place], [place] [address]    Report Status PENDING  Incomplete  Culture, blood (routine x 2)     Status: None (Preliminary result)   Collection Time: 09/11/19 12:09 PM   Specimen: BLOOD  Result Value Ref Range Status   Specimen Description   Final    BLOOD LEFT ARM Performed at [facility] [address] [place], [place] [address]    Special Requests   Final    BOTTLES DRAWN AEROBIC AND ANAEROBIC Blood Culture adequate volume Performed at [facility] [address] [place], [place] [address]    Culture   Final    NO GROWTH 2 DAYS Performed at [facility] [address] [place], [place] [address]    Report Status PENDING  Incomplete  Culture,  blood (routine x 2)     Status: None (Preliminary result)   Collection Time: 09/11/19 12:15 PM   Specimen: BLOOD LEFT HAND  Result Value Ref Range Status   Specimen Description   Final    BLOOD LEFT HAND Performed at [facility] [address], [place], [place] [address]    Special Requests   Final    BOTTLES DRAWN AEROBIC AND ANAEROBIC Blood Culture adequate volume Performed at [facility] [address], [place], [place] [address]    Culture   Final    NO GROWTH 2 DAYS Performed at [facility] [address] [place], [place] [address]    Report Status PENDING  Incomplete  Culture, Urine     Status: Abnormal   Collection Time: 09/11/19 11:45 PM   Specimen: Urine, Random  Result Value Ref Range Status   Specimen Description   Final    URINE, RANDOM Performed at [facility] [address], [place], [place] [address]    Special Requests   Final    NONE Performed at [facility], [place] [address], [place], [place] [address]    Culture MULTIPLE SPECIES PRESENT, SUGGEST RECOLLECTION (A)  Final   Report Status 09/12/2019 FINAL  Final    Radiology Studies: Dg Chest Port 1 View  Result Date: 09/13/2019 CLINICAL DATA:  Shortness of breath. EXAM: PORTABLE CHEST 1 VIEW COMPARISON:  09/11/2019 FINDINGS: Cardiomediastinal contours remain enlarged. Signs of left lower lobe airspace disease as before. No dense consolidation. No acute bone finding. IMPRESSION: No change in the appearance of the chest since prior study. Electronically Signed   By: Zetta Bills M.D.   On: 09/13/2019 10:03   Dg Chest Port 1 View  Result Date: 09/11/2019 CLINICAL DATA:  Fever EXAM: PORTABLE CHEST 1 VIEW COMPARISON:  09/06/2019 FINDINGS: Cardiomegaly with vascular congestion. Left lower lobe atelectasis or infiltrate. No confluent opacity on the right. No effusions or acute bony abnormality. IMPRESSION: Cardiomegaly, vascular  congestion. Left lower lobe atelectasis or infiltrate. Electronically Signed   By: Rolm Baptise M.D.   On: 09/11/2019 20:39   Scheduled Meds: . allopurinol  100 mg Oral Daily  . apixaban  5 mg Oral BID  . diazepam  2.5 mg Oral QHS  . feeding supplement (ENSURE ENLIVE)  237 mL Oral BID BM  . furosemide  20 mg Oral Daily  . mouth rinse  15 mL Mouth Rinse BID  . metoprolol succinate  100 mg Oral Daily  . pantoprazole  40 mg Oral Daily  . polyethylene glycol  17 g Oral BID  . potassium chloride  20 mEq Oral Daily  . senna-docusate  1 tablet Oral BID  . sodium chloride flush  5 mL Intracatheter Q8H   Continuous Infusions: . fluconazole (DIFLUCAN) IV 100 mg (09/13/19 1249)  . piperacillin-tazobactam (ZOSYN)  IV 3.375 g (09/13/19 1550)    LOS: 15 days    Kerney Elbe, DO [facility] PAGER is on AMION  If 7PM-7AM, please contact night-coverage [email or web address] Password [email or web address] 09/13/2019, 4:09 PM

## 2019-09-13 NOTE — Progress Notes (Signed)
Referring Physician(s): Arta Silence   Supervising Physician: Markus Daft  Patient Status:  [facility] - In-pt  Chief Complaint: Abdominal pain    Subjective: Patient without complaints.   Allergies: Flecainide acetate  Medications: Prior to Admission medications   Medication Sig Start Date End Date Taking? Authorizing Provider  allopurinol (ZYLOPRIM) 100 MG tablet TAKE 1 TABLET BY MOUTH EVERY DAY Patient taking differently: Take 100 mg by mouth daily.  06/24/19  Yes [name], MD  calcium carbonate (OSCAL) 1500 (600 Ca) MG TABS tablet Take 1,500 mg by mouth daily.    Yes [provider]  calcium carbonate (TUMS - DOSED IN MG ELEMENTAL CALCIUM) 500 MG chewable tablet Chew 1 tablet by mouth as needed for indigestion or heartburn.   Yes [provider]  cholecalciferol (VITAMIN D3) 25 MCG (1000 UT) tablet Take 1,000 Units by mouth daily.   Yes [provider]  CINNAMON PO Take 1 tablet by mouth daily.    Yes [provider]  clindamycin (CLINDAGEL) 1 % gel APPLY TO AFFECTED AREA TWICE A DAY Patient taking differently: Apply 1 application topically 2 (two) times daily.  04/13/18  Yes [name], MD  colchicine 0.6 MG tablet Take 1 tablet (0.6 mg total) by mouth 2 (two) times daily. Take twice daily until pain resolves and then take as needed for gout flare. Patient taking differently: Take 0.6 mg by mouth 2 (two) times daily as needed. Take twice daily until pain resolves and then take as needed for gout flare. 06/15/18  Yes [name], MD  [employer] ANTI-FUNGAL 2 % powder APPLY TO AFFECTED AREA TWICE A DAY AFTER SKIN RESOLVES FROM KETOCONAZOLE CREAM Patient taking differently: as needed.  10/17/14  Yes [name], MD  diazepam (VALIUM) 5 MG tablet TAKE 1/2 TABLET (2.5 MG TOTAL) BY MOUTH AT BEDTIME. Patient taking differently: Take 2.5 mg by mouth at bedtime.  07/08/19  Yes [name], MD  doxycycline (VIBRAMYCIN) 50 MG capsule TAKE 2  CAPSULES BY MOUTH 2 (TWO) TIMES DAILY. Patient taking differently: Take 100 mg by mouth See admin instructions. Twice daily As directed for deer tick's (allergic reaction) 04/25/18  Yes [name], MD  ELIQUIS 5 MG TABS tablet TAKE 1 TABLET BY MOUTH TWICE A DAY Patient taking differently: Take 5 mg by mouth 2 (two) times daily.  05/23/19  Yes Park Liter, MD  Flax Oil-Fish Oil-Borage Oil (FISH-FLAX-BORAGE) CAPS Take 1 capsule by mouth daily.   Yes [provider]  furosemide (LASIX) 20 MG tablet Take 1 tablet (20 mg total) by mouth daily. Patient taking differently: Take 20 mg by mouth daily.  08/19/19  Yes Park Liter, MD  glucosamine-chondroitin (GLUCOSAMINE-CHONDROITIN DS) 500-400 MG tablet Take 1 tablet by mouth daily.    Yes [provider]  HYDROcodone-acetaminophen (NORCO/VICODIN) 5-325 MG tablet Take 1 tablet by mouth every 6 (six) hours as needed for moderate pain. 08/22/19  Yes [name], [name], MD  ibuprofen (ADVIL) 800 MG tablet Take 1 tablet (800 mg total) by mouth every 8 (eight) hours as needed. 08/22/19  Yes [name], [name], MD  lisinopril-hydrochlorothiazide (ZESTORETIC) 20-25 MG tablet Take 1 tablet by mouth daily. Patient taking differently: Take 1 tablet by mouth daily.  03/08/19  Yes [name], MD  loratadine (CLARITIN) 10 MG tablet Take 1 tablet (10 mg total) by mouth daily. 07/08/19  Yes [name], MD  metoprolol succinate (TOPROL-XL) 100 MG 24 hr tablet Take 1 tablet (100 mg  total) by mouth daily. Take with or immediately following a meal. Patient taking differently: Take 100 mg by mouth daily. Take with or immediately following a meal. 01/29/19 08/28/19 Yes Park Liter, MD  Multiple Vitamins-Minerals (EMERGEN-C IMMUNE) PACK Take 1 tablet by mouth daily.    Yes [provider]  Multiple Vitamins-Minerals (MULTIVITAMIN WOMEN 50+) TABS Take 1 tablet by mouth daily.    Yes [provider]   mupirocin ointment (BACTROBAN) 2 % APPLY TOPICALLY TO AFFECTED AREA TWICE DAILY Patient taking differently: as needed.  02/20/19  Yes [name], MD  Nutritional Supplements (ECHINACEA/GOLDEN SEAL PO) Take 1 tablet by mouth daily.    Yes [provider]  polycarbophil (FIBERCON) 625 MG tablet Take 625 mg by mouth daily.   Yes [provider]  potassium chloride (KLOR-CON) 20 MEQ packet MIX 1 PACKET IN LIQUID AND TAKE BY MOUTH DAILY AS DIRECTED Patient taking differently: Take 20 mEq by mouth daily. MIX 1 PACKET IN LIQUID AND TAKE BY MOUTH DAILY AS DIRECTED 02/20/19  Yes [name], MD  Probiotic Product (PROBIOTIC-10 PO) Take 1 tablet by mouth daily.    Yes [provider]  TURMERIC PO Take 1 tablet by mouth daily.    Yes [provider]  alendronate (FOSAMAX) 10 MG tablet TAKE (1) TABLET BY MOUTH PER WEEK TAKE ON EMPTY STOMACH WITH FULL GLASS OF WATER 09/11/19   [name], MD     Vital Signs: BP 114/71 (BP Location: Left Arm)   Pulse 82   Temp 97.9 F (36.6 C)   Resp 16   Ht 5\' 2"  (1.575 m)   Wt 205 lb 7.5 oz (93.2 kg)   SpO2 96%   BMI 37.58 kg/m   Physical Exam Patient is alert/ awake. Gallbladder fossa drain intact with sutures and stat lock in place, no erythema or tenderness noted. Dressing clean dry and intact. Drain is placed to gravity with output that is yellow   Imaging: Ct Abdomen Wo Contrast  Result Date: 09/10/2019 CLINICAL DATA:  Status post laparoscopic cholecystectomy on 08/22/2019, with gallbladder fossa fluid collection/biloma, status post strain on 10/23. EXAM: CT ABDOMEN WITHOUT CONTRAST TECHNIQUE: Multidetector CT imaging of the abdomen was performed following the standard protocol without IV contrast. COMPARISON:  08/29/2019 FINDINGS: Lower chest: Small bilateral pleural effusions. Associated lower lobe atelectasis. Hyperdense blood pool relative to myocardium, suggesting anemia. Hepatobiliary: Unenhanced liver is  grossly unremarkable. Status post cholecystectomy. No intrahepatic or extrahepatic ductal dilatation. Indwelling common duct stent with associated pneumobilia. Percutaneous pigtail drain in the gallbladder fossa. No residual fluid collection/abscess. Trace stranding with to foci of gas in the gallbladder fossa (series 2/images 28-29). Pancreas: Within normal limits. Spleen: Within normal limits. Adrenals/Urinary Tract: Adrenal glands are within normal limits. Kidneys are within normal limits. Bilateral extrarenal pelvis, right greater than left. Stomach/Bowel: Stomach and visualized bowel are unremarkable. Vascular/Lymphatic: No evidence of abdominal aortic aneurysm. Atherosclerotic calcifications of the abdominal aorta and branch vessels. No suspicious abdominal lymphadenopathy. Other: No abdominal ascites. Musculoskeletal: Degenerative changes of the lower thoracic spine. IMPRESSION: Percutaneous pigtail drain in the gallbladder fossa. No residual fluid collection/abscess. Status post cholecystectomy. Indwelling common duct stent with pneumobilia. Small pleural effusions.  Associated lower atelectasis. Electronically Signed   By: Julian Hy M.D.   On: 09/10/2019 11:10   Ir Sinus/fist Tube Chk-non Gi  Result Date: 09/11/2019 CLINICAL DATA:  History of laparoscopic cholecystectomy complicated by development of bile leak post percutaneous drainage catheter placement into gallbladder fossa fluid collection on  08/30/2019 (Dr. Laurence Ferrari). Additionally, patient has undergone ERCP with internal biliary stent placement on 09/03/2019. Subsequent CT scan the abdomen pelvis performed earlier today demonstrates complete resolution the gallbladder fossa fluid collection and as such patient now presents for drainage catheter injection prior to potential removal. EXAM: FLUOROSCOPIC GUIDED DRAIN INJECTION COMPARISON:  CT abdomen pelvis-earlier same day; 08/29/2019; CT-guided percutaneous drainage catheter  placement-08/30/2019; ERCP-09/03/2019 CONTRAST:  36mL OMNIPAQUE IOHEXOL 300 MG/ML SOLN-administered via the existing percutaneous drainage catheter FLUOROSCOPY TIME:  1 minutes, 18 seconds (69 mGy) TECHNIQUE: The patient was positioned supine on the fluoroscopy table. A preprocedural spot fluoroscopic image was obtained of the right upper abdominal quadrant and existing percutaneous drainage catheter Multiple spot fluoroscopic and radiographic images were obtained following the injection of a small amount of contrast via the existing percutaneous drainage catheter. Images reviewed and the drainage catheter was flushed with a small amount of saline. The drainage catheter was converted from a JP bulb to a gravity bag. A dressing was applied. The patient tolerated the procedure well without immediate postprocedural complication. FINDINGS: Preprocedural spot fluoroscopic image demonstrates unchanged positioning percutaneous drainage catheter with end coiled and locked over the lying the expected location the gallbladder fossa. An internal plastic biliary stent overlies expected location of the CBD Contrast injection demonstrates opacification of the decompressed gallbladder fossa fluid collection with persistent communication to the biliary tree though to the expected location of the cystic duct remnant, rather to a division of the right biliary tree. There is passage of contrast through the CBD and biliary stent to the level of the duodenum. There is minimal opacification of the cystic duct remnant without definitive evidence of contrast extravasation from the cystic duct remnant. IMPRESSION: 1. Fluoroscopic guided drainage catheter injection demonstrates findings worrisome for bile leak from a division of the right biliary tree rather than the cystic duct remnant. 2. Widely patent and appropriately positioned internal biliary stent. Above findings discussed with Dr. Kieth Brightly on 09/11/2019. PLAN: - The patient  drainage catheter was converted from a JP bulb to a gravity bag. - The patient was instructed to not flush the percutaneous drainage catheter however to maintain diligent records regarding drainage catheter output. - Repeat drain injection may be performed in 2 weeks as deemed appropriate by Dr. Kieth Brightly. Electronically Signed   By: Sandi Mariscal M.D.   On: 09/11/2019 15:40   Dg Chest Port 1 View  Result Date: 09/13/2019 CLINICAL DATA:  Shortness of breath. EXAM: PORTABLE CHEST 1 VIEW COMPARISON:  09/11/2019 FINDINGS: Cardiomediastinal contours remain enlarged. Signs of left lower lobe airspace disease as before. No dense consolidation. No acute bone finding. IMPRESSION: No change in the appearance of the chest since prior study. Electronically Signed   By: Zetta Bills M.D.   On: 09/13/2019 10:03   Dg Chest Port 1 View  Result Date: 09/11/2019 CLINICAL DATA:  Fever EXAM: PORTABLE CHEST 1 VIEW COMPARISON:  09/06/2019 FINDINGS: Cardiomegaly with vascular congestion. Left lower lobe atelectasis or infiltrate. No confluent opacity on the right. No effusions or acute bony abnormality. IMPRESSION: Cardiomegaly, vascular congestion. Left lower lobe atelectasis or infiltrate. Electronically Signed   By: Rolm Baptise M.D.   On: 09/11/2019 20:39    Labs:  CBC: Recent Labs    09/10/19 0522 09/11/19 0459 09/12/19 0629 09/13/19 0538  WBC 8.7 18.4* 15.2* 10.3  HGB 10.1* 10.5* 10.2* 9.4*  HCT 31.1* 31.9* 31.9* 30.1*  PLT 630* 667* 658* 640*    COAGS: Recent Labs    08/29/19 1805 08/31/19  1226  INR 1.1 1.1  APTT  --  32    BMP: Recent Labs    09/10/19 0522 09/11/19 0459 09/12/19 0629 09/13/19 0538  NA 137 135 137 135  K 3.4* 3.9 3.7 3.9  CL 100 100 96* 95*  CO2 27 26 31 29   GLUCOSE 95 117* 103* 101*  BUN 19 22 19 21   CALCIUM 9.0 8.8* 8.9 8.8*  CREATININE 0.82 0.93 0.95 0.94  GFRNONAA >60 58* 57* 58*  GFRAA >60 >60 >60 >60    LIVER FUNCTION TESTS: Recent Labs     09/05/19 0202 09/07/19 0529 09/08/19 0552 09/12/19 0629 09/13/19 0538  BILITOT 1.0 1.2  --  0.7 1.0  AST 38 38  --  21 20  ALT 25 31  --  19 15  ALKPHOS 113 99  --  92 90  PROT 6.5 6.2*  --  6.9 6.5  ALBUMIN 2.7* 2.6* 2.4* 2.6* 2.6*    Assessment and Plan:  78 y.o. female history of calculous cholecystitis,  s/p lap cholecystectomy on 10.15.20 found to have a bile leak with formation of abscess in the gallbladder fossa.  IR placed an abscess drain to the gallbladder fossa on 10.23.20 with ERCP and CBD stent on 10.27.20. Drain injection performed on 11.3.20 indicates possible bile leak from a division of the right biliary tree with patent internal biliary stent. Recommend Team not flush abscess drain and continue daily output with dressing changes as needed with follow up drain injection two weeks from previous exam.  Electronically Signed: D. Rowe Robert, PA-C - Rushie Nyhan NP 09/13/2019, 12:00 PM   I spent a total of 15 Minutes at the the patient's bedside AND on the patient's hospital floor or unit, greater than 50% of which was counseling/coordinating care for abscess drain in gallbladder fossa    Patient ID: [name], female   DOB: [birth year], 78 y.o.   MRN: [id number]

## 2019-09-14 LAB — CBC WITH DIFFERENTIAL/PLATELET
Abs Immature Granulocytes: 0.06 10*3/uL (ref 0.00–0.07)
Basophils Absolute: 0.1 10*3/uL (ref 0.0–0.1)
Basophils Relative: 1 %
Eosinophils Absolute: 1.2 10*3/uL — ABNORMAL HIGH (ref 0.0–0.5)
Eosinophils Relative: 16 %
HCT: 30.7 % — ABNORMAL LOW (ref 36.0–46.0)
Hemoglobin: 9.8 g/dL — ABNORMAL LOW (ref 12.0–15.0)
Immature Granulocytes: 1 %
Lymphocytes Relative: 21 %
Lymphs Abs: 1.5 10*3/uL (ref 0.7–4.0)
MCH: 30.5 pg (ref 26.0–34.0)
MCHC: 31.9 g/dL (ref 30.0–36.0)
MCV: 95.6 fL (ref 80.0–100.0)
Monocytes Absolute: 0.7 10*3/uL (ref 0.1–1.0)
Monocytes Relative: 10 %
Neutro Abs: 3.8 10*3/uL (ref 1.7–7.7)
Neutrophils Relative %: 51 %
Platelets: 675 10*3/uL — ABNORMAL HIGH (ref 150–400)
RBC: 3.21 MIL/uL — ABNORMAL LOW (ref 3.87–5.11)
RDW: 13.3 % (ref 11.5–15.5)
WBC: 7.3 10*3/uL (ref 4.0–10.5)
nRBC: 0 % (ref 0.0–0.2)

## 2019-09-14 LAB — COMPREHENSIVE METABOLIC PANEL
ALT: 17 U/L (ref 0–44)
AST: 22 U/L (ref 15–41)
Albumin: 2.5 g/dL — ABNORMAL LOW (ref 3.5–5.0)
Alkaline Phosphatase: 93 U/L (ref 38–126)
Anion gap: 12 (ref 5–15)
BUN: 21 mg/dL (ref 8–23)
CO2: 26 mmol/L (ref 22–32)
Calcium: 8.9 mg/dL (ref 8.9–10.3)
Chloride: 98 mmol/L (ref 98–111)
Creatinine, Ser: 0.93 mg/dL (ref 0.44–1.00)
GFR calc Af Amer: >60 mL/min (ref >60)
GFR calc non Af Amer: 58 mL/min — ABNORMAL LOW (ref >60)
Glucose, Bld: 105 mg/dL — ABNORMAL HIGH (ref 70–99)
Potassium: 3.8 mmol/L (ref 3.5–5.1)
Sodium: 136 mmol/L (ref 135–145)
Total Bilirubin: 0.5 mg/dL (ref 0.3–1.2)
Total Protein: 6.6 g/dL (ref 6.5–8.1)

## 2019-09-14 LAB — PHOSPHORUS: Phosphorus: 4 mg/dL (ref 2.5–4.6)

## 2019-09-14 LAB — MAGNESIUM: Magnesium: 2.2 mg/dL (ref 1.7–2.4)

## 2019-09-14 MED ORDER — FLUCONAZOLE 100 MG PO TABS
100.0000 mg | ORAL_TABLET | Freq: Every day | ORAL | Status: DC
  Administered 2019-09-15 – 2019-09-16 (×2): 100 mg via ORAL
  Filled 2019-09-14 (×2): qty 1

## 2019-09-14 NOTE — Progress Notes (Signed)
PHARMACIST - PHYSICIAN COMMUNICATION  CONCERNING: Antibiotic IV to Oral Route Change Policy  RECOMMENDATION: This patient is receiving fluconazole by the intravenous route.  Based on criteria approved by the Pharmacy and Therapeutics Committee, the antibiotic(s) is/are being converted to the equivalent oral dose form(s).   DESCRIPTION: These criteria include:  Patient being treated for a respiratory tract infection, urinary tract infection, cellulitis or clostridium difficile associated diarrhea if on metronidazole  The patient is not neutropenic and does not exhibit a GI malabsorption state  The patient is eating (either orally or via tube) and/or has been taking other orally administered medications for a least 24 hours  The patient is improving clinically and has a Tmax < 100.5  If you have questions about this conversion, please contact the Pharmacy Department  []  ( [phone] )  [place] []  ( [phone] )  [facility] []  ( [phone]  [place] []  ( [phone] )  [facility] [x]  ( [phone] )  [facility]  

## 2019-09-14 NOTE — Progress Notes (Signed)
Instructor agrees with previous note from Stage manager.  Assisted SN w/ cleaning of patient, linen change, pure wick change, SCD boot change. Pt. Instructed to notify staff for any further bowel movements.

## 2019-09-14 NOTE — Progress Notes (Signed)
When entering pts room, pt mentioned feeling like she had a bowel movement around thirty minutes prior but did not want to bother anyone to get cleaned up and stated that she "would wait until she was ready to go to sleep for the night to get cleaned up". Reassured pt that it was no problem and she needed to be cleaned now and checked before she went to sleep for the night. Pt was actively involved in care. This SRN and Olivia Mackie, RN replaced all bedding and SCD boots. Will continue to monitor.

## 2019-09-14 NOTE — Progress Notes (Signed)
PROGRESS NOTE    Kristen [name]  EHM:[id number] DOB: [birth year] DOA: 08/28/2019 PCP: [name], MD   Brief Narrative:  The patient is a 78 year old female with a history of anxiety, atrial fibrillation on anticoagulation with Eliquis, hypertension, CKD uncertain baseline who came to ED with abdominal pain.  Patient recently underwent laparoscopic cholecystectomy on 08/22/2019 and was recovering uneventfully back at home but started developing cervical pain of right upper quadrant with radiation towards right shoulder blade in the ED CT abdomen pelvis revealed fluid collection in the gallbladder fossa.  General surgery was consulted HIDA scan was done which confirmed biliary leak and no activity in the small bowel.  Patient underwent gallbladder fossa drain placement per IR and ERCP with biliary duct stone removal and stent placement per GI.  CT scan done yesterday showed no residual collection but bile collection did show Candida and patient was started on antifungals.  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  IR recommending continuing maintaining drain for now and repeating follow-up injection in 1 week.  Because she spiked a temperature and had an elevated leukocytosis blood cultures were obtained and she is started on empiric antibiotics and now she is afebrile and white blood cell count is trending down.  Likely the patient has a left-sided pneumonia and question if this is from aspiration..  Patient continues to feel constipated so bowel regimen was added and she is improving.  Complaining of some blisters in her genital area likely in the setting of moisture so asked wound nurse to evaluate.  Assessment & Plan:   Principal Problem:   Acute renal failure (ARF)  (HCC) Active Problems:   Hypertensive heart disease   Permanent atrial fibrillation (HCC)   Acute on chronic diastolic CHF (congestive heart failure) (HCC)   Situational mixed anxiety and depressive disorder   Intraabdominal fluid collection   A-fib (HCC)   Right Upper Quadrant pain/Biliary Leak -HIDA scan suggested biliary leak after patient presents with right upper quadrant pain. -CT showed fluid collection in the gallbladder fossa.   -Biliary drain placement per IR yielded significant amount of fluid.   -ERCP showed choledocholithiasis, underwent removal of stone and stent placement.  GI has signed off.   -Patient was empirically started on IV Zosyn, and had completed 10 days of IV Zosyn but will resume now given fevers and worsened WBC.    Will continue Zosyn for another 5 days for HCAP -Repeat CT abdomen today shows percutaneous pigtail drain in gallbladder fossa, no residual fluid collection/abscess but she did spike a temperature yesterday -IR Recommending continuing Drain for at least another week -Bile showed Candida Parapsilosis and she is on fluconazole; and she has gotten 5 days of fluconazole and this was changed to p.o. now -Pan Cx as below and likely patient has a pneumonia -Blood cultures x2 and fungus culture showed no growth so far and urine culture was negative -IR recommending continuing the drain and discontinuing drain flushes and repeating drain injection 1 to 2 weeks -Continues to have some intermittent abdominal pain and will obtain a KUB in the morning and if worsening will repeat CT scan  But will defer to surgery and IR for further evaluation  Post ERCP Pancreatitis -patient developed post ERCP pancreatitis with lipase to 732 which improved to 92. -Her diet has been advanced  to solid food which she has been tolerating.    Acute Respiratory Failure with Hypoxia in the setting of CHF and LLL PNA Acute on Chronic Diastolic CHF -Patient developed acute on chronic  diastolic CHF exacerbation, secondary to fluid overload.   -Echocardiogram showed EF 60 to 65% improved with IV Lasix.   -Currently she is on Lasix 20 mg p.o. daily.   -She was on 2 L of oxygen via nasal cannula.  Continue to wean off oxygen as able and was weaned off today  -Repeat CXR in the a.m. -Strict I's/O's and Daily Weights; Patient is - 6.301 liters  -Continue to Monitor Respirator Status Carefully  A. Fib with RVR -CHA2DS2VASc score of at least 5 patient went into A. fib in the ED.  -She was started on Cardizem drip which has been stopped.   -She is on Eliquis for anticoagulation.   -Continue Metoprolol Succinate 100 mg po Daily for rate control. -No longer on Telemetry   Hypertension -blood pressure is stable but on the lower side and was 108/50  Hypokalemia -Potassium was 3.4 and improved to 3.8 -In the setting of Diuresis -Continue to Monitor and Replete as Necessary -Repeat CMP in AM   GERD -C/w PPI  Depression and Anxiety -C/w Diazepam 2.5 mg po qHS   Fever and Leukocytosis, likely in the setting of Healthcare associated LLL pneumonia, improving  -Pan Culture -Had a T-max of 101.8 the day before yesterday and WBC went from 8.7 -> 18.4 and worsened; today it is 7.3 and normalized after restarting IV Zosyn; will continue Zosyn for another 2 more days -Obtain Blood Cx x2 and showed no growth to date at 3 Days,  -Also checked urinalysis and urine culture as well as chest x-ray; UA showed a hazy appearance with large hemoglobin, rare bacteria, 6-10 RBCs per high-power field, 6-10 WBCs and urine culture showed multiple species -CXR yesterday showed "Cardiomegaly, vascular congestion. Left lower lobe atelectasis or infiltrate" and today it showed "Cardiomediastinal contours remain enlarged. Signs of left lower lobe airspace disease as before. No dense consolidation. No acute bone finding." -Fungus culture showing no growth to date at 3 Days -CT of the abdomen pelvis done  09/10/2019 showed "Percutaneous pigtail drain in the gallbladder fossa. No residual fluid collection/abscess. Status post cholecystectomy. Indwelling common duct stent with pneumobilia.Small pleural effusions.  Associated lower atelectasis." -Resumed with Zosyn and temperature is improved and leukocytosis is trending down -Continue to Monitor and Trend Temperature Curve and WBC -Repeat CBC in AM and follow Cx's  -Added DuonEb 3 mL q6hprn Wheezing and SOB -Added Guaifenesin 1200 mg po BID, and Will continue Flutter Valve and Incentive Spirometry  -Now the patient is currently off of supplemental oxygen via nasal cannula  Obesity -Estimated body mass index is 36.65 kg/m as calculated from the following:   Height as of this encounter: 5\' 2"  (1.575 m).   Weight as of this encounter: 90.9 kg. -Weight Loss and Dietary Counseling given   Constipation -C/w Miralax 17 grams BID -C/w Senna-Docusate 1 tab po BID for now -Patient states that she is probably having some improvement in her bowel regimen  Blistering/Rash in the genital area and buttocks -Likely in the setting of urine and feces; patient has had stress incontinence and states that she is having very large bowel movement and she sat in yesterday -I have asked the wound nurse to evaluate -Currently is on antibiotics so do not feel this is anything bacterial  DVT prophylaxis: Anticoagulated with Apixaban  5 mg po BID  Code Status: FULL CODE  Family Communication: No family  Disposition Plan: Pending Clinical Improvement and Clearance by General Surgery and IR   Consultants:  -General Surgery -Gastroenterology -Interventional Radiology   Procedures:  Pt s/p lap chole 10/15; now with GB fossa fluid collection/biloma, s/p drain placement 10/23;s/p ERCP with removal of CBD stone/ CBD stent 10/27  Drain Injection 09/10/2019 demonstrated persistent communication btwn decompressed GB fossa fluid collection and biliary tree with 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; JP bulb converted to gravity bag  Antimicrobials:  Anti-infectives (From admission, onward)   Start     Dose/Rate Route Frequency Ordered Stop   09/15/19 1000  fluconazole (DIFLUCAN) tablet 100 mg     100 mg Oral Daily 09/14/19 1445     09/11/19 1200  piperacillin-tazobactam (ZOSYN) IVPB 3.375 g     3.375 g 12.5 mL/hr over 240 Minutes Intravenous Every 8 hours 09/11/19 1129     09/10/19 1200  fluconazole (DIFLUCAN) IVPB 100 mg  Status:  Discontinued     100 mg 50 mL/hr over 60 Minutes Intravenous Every 24 hours 09/10/19 1033 09/14/19 1445   08/31/19 1200  piperacillin-tazobactam (ZOSYN) IVPB 3.375 g  Status:  Discontinued     3.375 g 12.5 mL/hr over 240 Minutes Intravenous Every 8 hours 08/31/19 0743 09/08/19 1456   08/30/19 0400  piperacillin-tazobactam (ZOSYN) IVPB 2.25 g  Status:  Discontinued     2.25 g 100 mL/hr over 30 Minutes Intravenous Every 8 hours 08/29/19 1747 08/31/19 0743   08/29/19 1800  piperacillin-tazobactam (ZOSYN) IVPB 3.375 g     3.375 g 100 mL/hr over 30 Minutes Intravenous  Once 08/29/19 1745 08/29/19 2043     Subjective: Seen and examined at bedside and felt okay.  States that she was concerned about the blistering in her genital area and buttocks.  No nausea or vomiting.  Complained of significant amount of abdominal pain noted in the right upper quadrant yesterday that radiated to her back.  Patient states that it was intermittent but now improved.  Happy that her numbers have improved.  No other concerns or complaints at this time.  Objective: Vitals:   09/14/19 0508 09/14/19 0538 09/14/19 0820 09/14/19 1400  BP:  (!) 118/48 116/63 (!) 108/50  Pulse:  87 64 78  Resp:  16  18  Temp:  98.8 F (37.1 C) 97.9 F (36.6 C) 98.4 F (36.9 C)  TempSrc:  Oral Oral Oral  SpO2:  94% 94% 98%  Weight: 90.9 kg     Height:        Intake/Output Summary (Last 24  hours) at 09/14/2019 1659 Last data filed at 09/14/2019 1200 Gross per 24 hour  Intake 467.4 ml  Output 1150 ml  Net -682.6 ml   Filed Weights   09/12/19 0500 09/13/19 0500 09/14/19 0508  Weight: 91.8 kg 93.2 kg 90.9 kg   Examination: Physical Exam:  Constitutional: WN/WD obese Caucasian female in NAD but still a little anxious Eyes: Lids and conjunctivae normal, sclerae anicteric  ENMT: External Ears, Nose appear normal. Grossly normal hearing. Mucous membranes are moist.  Neck: Appears normal, supple, no cervical masses, normal ROM, no appreciable thyromegaly; no JVD Respiratory: Diminished to auscultation bilaterally but slightly worse on the Left, no wheezing, rales, rhonchi or crackles. Normal respiratory effort and patient is not tachypenic. No accessory muscle use. Unlabored breathing and off of Supplemental O2 via [place]  Cardiovascular: Irregularly Irregular, no murmurs / rubs / gallops. S1 and S2 auscultated. No appreciable LE Edema Abdomen: Soft, Slightly tender in the RUQ, Distended. Bowel sounds positive. Has a Drain in place GU: Deferred. Musculoskeletal: No clubbing / cyanosis of digits/nails. No joint deformity upper and lower extremities.   Skin: Warm and dry.  Neurologic: CN 2-12 grossly intact with no focal deficits. Romberg sign and cerebellar reflexes not assessed.  Psychiatric: Normal judgment and insight. Alert and oriented x 3. Anxious mood and appropriate affect.   Data Reviewed: I have personally reviewed following labs and imaging studies  CBC: Recent Labs  Lab 09/10/19 0522 09/11/19 0459 09/12/19 0629 09/13/19 0538 09/14/19 0548  WBC 8.7 18.4* 15.2* 10.3 7.3  NEUTROABS  --   --  10.3* 5.8 3.8  HGB 10.1* 10.5* 10.2* 9.4* 9.8*  HCT 31.1* 31.9* 31.9* 30.1* 30.7*  MCV 96.3 95.5 97.9 96.8 95.6  PLT 630* 667* 658* 640* 672*   Basic Metabolic Panel: Recent Labs  Lab 09/08/19 0552 09/10/19 0522 09/11/19 0459 09/12/19 0629 09/13/19 0538 09/14/19 0548   NA 137 137 135 137 135 136  K 3.4* 3.4* 3.9 3.7 3.9 3.8  CL 97* 100 100 96* 95* 98  CO2 29 27 26 31 29 26   GLUCOSE 96 95 117* 103* 101* 105*  BUN 19 19 22 19 21 21   CREATININE 1.13* 0.82 0.93 0.95 0.94 0.93  CALCIUM 8.6* 9.0 8.8* 8.9 8.8* 8.9  MG  --   --   --  2.2 2.1 2.2  PHOS 2.9  --   --  3.5 4.0 4.0   GFR: Estimated Creatinine Clearance: 51.4 mL/min (by C-G formula based on SCr of 0.93 mg/dL). Liver Function Tests: Recent Labs  Lab 09/08/19 0552 09/12/19 0629 09/13/19 0538 09/14/19 0548  AST  --  21 20 22   ALT  --  19 15 17   ALKPHOS  --  92 90 93  BILITOT  --  0.7 1.0 0.5  PROT  --  6.9 6.5 6.6  ALBUMIN 2.4* 2.6* 2.6* 2.5*   Recent Labs  Lab 09/08/19 0552  LIPASE 92*   No results for input(s): AMMONIA in the last 168 hours. Coagulation Profile: No results for input(s): INR, PROTIME in the last 168 hours. Cardiac Enzymes: No results for input(s): CKTOTAL, CKMB, CKMBINDEX, TROPONINI in the last 168 hours. BNP (last 3 results) No results for input(s): PROBNP in the last 8760 hours. HbA1C: No results for input(s): HGBA1C in the last 72 hours. CBG: No results for input(s): GLUCAP in the last 168 hours. Lipid Profile: No results for input(s): CHOL, HDL, LDLCALC, TRIG, CHOLHDL, LDLDIRECT in the last 72 hours. Thyroid Function Tests: No results for input(s): TSH, T4TOTAL, FREET4, T3FREE, THYROIDAB in the last 72 hours. Anemia Panel: No results for input(s): VITAMINB12, FOLATE, FERRITIN, TIBC, IRON, RETICCTPCT in the last 72 hours. Sepsis Labs: No results for input(s): PROCALCITON, LATICACIDVEN in the last 168 hours.  Recent Results (from the past 240 hour(s))  Body fluid culture     Status: None   Collection Time: 09/06/19  2:58 PM   Specimen: BILE; Body Fluid  Result Value Ref Range Status   Specimen Description   Final    BILE Performed at [facility] [address] [place], [place] [address]    Special Requests   Final     Normal Performed at [facility], [place] [address], [place], [place] [address]    Gram Stain   Final  NO WBC SEEN ABUNDANT YEAST Performed at [facility], [place] [address] [place], [place] [address]    Culture ABUNDANT CANDIDA PARAPSILOSIS  Final   Report Status 09/08/2019 FINAL  Final  Fungus culture, blood     Status: None (Preliminary result)   Collection Time: 09/11/19 12:09 PM   Specimen: BLOOD  Result Value Ref Range Status   Specimen Description   Final    BLOOD LEFT ARM Performed at [facility] [address] [place], [place] [address]    Special Requests   Final    BOTTLES DRAWN AEROBIC ONLY Blood Culture adequate volume Performed at [facility] [address], [place], [place] [address]    Culture   Final    NO GROWTH 3 DAYS Performed at [facility], [place] [address] [place], [place] [address]    Report Status PENDING  Incomplete  Culture, blood (routine x 2)     Status: None (Preliminary result)   Collection Time: 09/11/19 12:09 PM   Specimen: BLOOD  Result Value Ref Range Status   Specimen Description   Final    BLOOD LEFT ARM Performed at [facility] [address] [place], [place] [address]    Special Requests   Final    BOTTLES DRAWN AEROBIC AND ANAEROBIC Blood Culture adequate volume Performed at [facility] [address], [place], [place] [address]    Culture   Final    NO GROWTH 3 DAYS Performed at [facility], [place] [address] [place], [place] [address]    Report Status PENDING  Incomplete  Culture, blood (routine x 2)     Status: None (Preliminary result)   Collection Time: 09/11/19 12:15 PM   Specimen: BLOOD LEFT HAND  Result Value Ref Range Status   Specimen Description   Final    BLOOD LEFT HAND Performed at [facility] [address], [place], [place] [address]    Special Requests   Final     BOTTLES DRAWN AEROBIC AND ANAEROBIC Blood Culture adequate volume Performed at [facility] [address] [place], [place] [address]    Culture   Final    NO GROWTH 3 DAYS Performed at [facility], [place] [address], [place], [place] [address]    Report Status PENDING  Incomplete  Culture, Urine     Status: Abnormal   Collection Time: 09/11/19 11:45 PM   Specimen: Urine, Random  Result Value Ref Range Status   Specimen Description   Final    URINE, RANDOM Performed at [facility] [address] [place],  [address]    Special Requests   Final    NONE Performed at [facility], [place] [address] [place],  [address]    Culture MULTIPLE SPECIES PRESENT, SUGGEST RECOLLECTION (A)  Final   Report Status 09/12/2019 FINAL  Final    Radiology Studies: Dg Chest Port 1 View  Result Date: 09/13/2019 CLINICAL DATA:  Shortness of breath. EXAM: PORTABLE CHEST 1 VIEW COMPARISON:  09/11/2019 FINDINGS: Cardiomediastinal contours remain enlarged. Signs of left lower lobe airspace disease as before. No dense consolidation. No acute bone finding. IMPRESSION: No change in the appearance of the chest since prior study. Electronically Signed   By: Zetta Bills M.D.   On: 09/13/2019 10:03   Scheduled Meds: . allopurinol  100 mg Oral Daily  . apixaban  5 mg Oral  BID  . diazepam  2.5 mg Oral QHS  . feeding supplement (ENSURE ENLIVE)  237 mL Oral BID BM  . [START ON 09/15/2019] fluconazole  100 mg Oral Daily  . furosemide  20 mg Oral Daily  . guaiFENesin  1,200 mg Oral BID  . mouth rinse  15 mL Mouth Rinse BID  . metoprolol succinate  100 mg Oral Daily  . pantoprazole  40 mg Oral Daily  . polyethylene glycol  17 g Oral BID  . potassium chloride  20 mEq Oral Daily  . senna-docusate  1 tablet Oral BID  . sodium chloride flush  5 mL Intracatheter Q8H   Continuous Infusions: . piperacillin-tazobactam (ZOSYN)  IV 3.375 g  (09/14/19 1443)    LOS: 16 days    Kerney Elbe, DO [facility] PAGER is on [place]  If 7PM-7AM, please contact night-coverage [email or web address] Password [facility] 09/14/2019, 4:59 PM

## 2019-09-15 ENCOUNTER — Inpatient Hospital Stay (HOSPITAL_COMMUNITY): Payer: Medicare Other

## 2019-09-15 LAB — CBC WITH DIFFERENTIAL/PLATELET
Abs Immature Granulocytes: 0.06 10*3/uL (ref 0.00–0.07)
Basophils Absolute: 0.1 10*3/uL (ref 0.0–0.1)
Basophils Relative: 1 %
Eosinophils Absolute: 0.7 10*3/uL — ABNORMAL HIGH (ref 0.0–0.5)
Eosinophils Relative: 9 %
HCT: 33.3 % — ABNORMAL LOW (ref 36.0–46.0)
Hemoglobin: 10.7 g/dL — ABNORMAL LOW (ref 12.0–15.0)
Immature Granulocytes: 1 %
Lymphocytes Relative: 25 %
Lymphs Abs: 1.9 10*3/uL (ref 0.7–4.0)
MCH: 30.7 pg (ref 26.0–34.0)
MCHC: 32.1 g/dL (ref 30.0–36.0)
MCV: 95.4 fL (ref 80.0–100.0)
Monocytes Absolute: 0.7 10*3/uL (ref 0.1–1.0)
Monocytes Relative: 9 %
Neutro Abs: 4.3 10*3/uL (ref 1.7–7.7)
Neutrophils Relative %: 55 %
Platelets: 688 10*3/uL — ABNORMAL HIGH (ref 150–400)
RBC: 3.49 MIL/uL — ABNORMAL LOW (ref 3.87–5.11)
RDW: 13.5 % (ref 11.5–15.5)
WBC: 7.7 10*3/uL (ref 4.0–10.5)
nRBC: 0 % (ref 0.0–0.2)

## 2019-09-15 LAB — COMPREHENSIVE METABOLIC PANEL
ALT: 8 U/L (ref 0–44)
AST: 23 U/L (ref 15–41)
Albumin: 2.7 g/dL — ABNORMAL LOW (ref 3.5–5.0)
Alkaline Phosphatase: 96 U/L (ref 38–126)
Anion gap: 13 (ref 5–15)
BUN: 18 mg/dL (ref 8–23)
CO2: 24 mmol/L (ref 22–32)
Calcium: 9.2 mg/dL (ref 8.9–10.3)
Chloride: 99 mmol/L (ref 98–111)
Creatinine, Ser: 1.03 mg/dL — ABNORMAL HIGH (ref 0.44–1.00)
GFR calc Af Amer: 60 mL/min — ABNORMAL LOW (ref >60)
GFR calc non Af Amer: 52 mL/min — ABNORMAL LOW (ref >60)
Glucose, Bld: 104 mg/dL — ABNORMAL HIGH (ref 70–99)
Potassium: 3.6 mmol/L (ref 3.5–5.1)
Sodium: 136 mmol/L (ref 135–145)
Total Bilirubin: 0.6 mg/dL (ref 0.3–1.2)
Total Protein: 7.3 g/dL (ref 6.5–8.1)

## 2019-09-15 LAB — PHOSPHORUS: Phosphorus: 4 mg/dL (ref 2.5–4.6)

## 2019-09-15 LAB — MAGNESIUM: Magnesium: 2.1 mg/dL (ref 1.7–2.4)

## 2019-09-15 MED ORDER — GERHARDT'S BUTT CREAM
TOPICAL_CREAM | Freq: Four times a day (QID) | CUTANEOUS | Status: DC
  Administered 2019-09-15 – 2019-09-17 (×6): via TOPICAL
  Filled 2019-09-15 (×2): qty 1

## 2019-09-15 NOTE — Progress Notes (Signed)
Patient is sitting with HOB elevated. Consumed 100% of breakfast. Complaint of back pain. Tylenol 650 mg PO PRN given as ordered. Patient had large stool in early AM and declined Senna and Miralax at this time. Call bell within reach. Will continue to monitor.

## 2019-09-15 NOTE — Progress Notes (Signed)
PROGRESS NOTE    Kristen [name]  GYB:[id number] DOB: April 20, 1941 DOA: 08/28/2019 PCP: [name], MD   Brief Narrative:  The patient is a 78 year old female with a history of anxiety, atrial fibrillation on anticoagulation with Eliquis, hypertension, CKD uncertain baseline who came to ED with abdominal pain.  Patient recently underwent laparoscopic cholecystectomy on 08/22/2019 and was recovering uneventfully back at home but started developing cervical pain of right upper quadrant with radiation towards right shoulder blade in the ED CT abdomen pelvis revealed fluid collection in the gallbladder fossa.  General surgery was consulted HIDA scan was done which confirmed biliary leak and no activity in the small bowel.  Patient underwent gallbladder fossa drain placement per IR and ERCP with biliary duct stone removal and stent placement per GI.  CT scan done yesterday showed no residual collection but bile collection did show Candida and patient was started on antifungals.  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  IR recommending continuing maintaining drain for now and repeating follow-up injection in 1 week.  Because she spiked a temperature and had an elevated leukocytosis blood cultures were obtained and she is started on empiric antibiotics and now she is afebrile and white blood cell count is trending down.  Likely the patient has a left-sided pneumonia and question if this is from aspiration..  Patient continues to feel constipated so bowel regimen was added and she is improving.  Complaining of some blisters in her genital area likely in the setting of moisture so asked wound nurse to evaluate.  Assessment & Plan:   Principal Problem:   Acute renal failure (ARF)  (HCC) Active Problems:   Hypertensive heart disease   Permanent atrial fibrillation (HCC)   Acute on chronic diastolic CHF (congestive heart failure) (HCC)   Situational mixed anxiety and depressive disorder   Intraabdominal fluid collection   A-fib (HCC)   Right Upper Quadrant pain/Biliary Leak -HIDA scan suggested biliary leak after patient presents with right upper quadrant pain. -CT showed fluid collection in the gallbladder fossa.   -Biliary drain placement per IR yielded significant amount of fluid.   -ERCP showed choledocholithiasis, underwent removal of stone and stent placement.  GI has signed off.   -Patient was empirically started on IV Zosyn, and had completed 10 days of IV Zosyn but will resume now given fevers and worsened WBC.    Will continue Zosyn for another 5 days for HCAP -Repeat CT abdomen today shows percutaneous pigtail drain in gallbladder fossa, no residual fluid collection/abscess but she did spike a temperature yesterday -IR Recommending continuing Drain for at least another week -Bile showed Candida Parapsilosis and she is on fluconazole; and she has gotten 5 days of fluconazole and this was changed to p.o. now -Pan Cx as below and likely patient has a pneumonia -Blood cultures x2 and fungus culture showed no growth at 4 Das so far and urine culture was negative -IR recommending continuing the drain and discontinuing drain flushes and repeating drain injection 1 to 2 weeks -Continues to have some intermittent abdominal pain and will obtain a KUB in the morning and if worsening will repeat CT scan  But will defer to surgery and IR for further evaluation  Post ERCP Pancreatitis -patient developed post ERCP pancreatitis with lipase to 732 which improved to 92. -Her diet  has been advanced to solid food which she has been tolerating.    Acute Respiratory Failure with Hypoxia in the setting of CHF and LLL PNA Acute on Chronic Diastolic CHF -Patient developed acute on  chronic diastolic CHF exacerbation, secondary to fluid overload.   -Echocardiogram showed EF 60 to 65% improved with IV Lasix but now back on po Lasix  -Currently she is on Lasix 20 mg p.o. daily.   -She was on 2 L of oxygen via nasal cannula.  Continue to wean off oxygen as able and was weaned off today  -Repeat CXR this AM showed "Unchanged AP portable examination with heterogeneous opacity at the left lung base, consistent with infection, atelectasis, and/or scarring. No new airspace opacity. Mild cardiomegaly." -Strict I's/O's and Daily Weights; Patient is -55.685 Liters  -Continue to Monitor Respirator Status Carefully  A. Fib with RVR, improved  -CHA2DS2VASc score of at least 5 patient went into A. fib in the ED.  -She was started on Cardizem drip which has been stopped.   -She is on Eliquis for anticoagulation.   -Continue Metoprolol Succinate 100 mg po Daily for rate control. -No longer on Telemetry   Hypertension -blood pressure is stable but on the lower side and was 113/69  Hypokalemia -Potassium now 3.6 -In the setting of Diuresis -Continue to Monitor and Replete as Necessary -Repeat CMP in AM   GERD -C/w PPI  Depression and Anxiety -C/w Diazepam 2.5 mg po qHS   Fever and Leukocytosis, likely in the setting of Healthcare associated LLL pneumonia, improving  -Pan Culture -Had a T-max of 101.8 the day before yesterday and WBC went from 8.7 -> 18.4 and worsened; today it is 7.3 and normalized after restarting IV Zosyn; will continue Zosyn for another 1 more days -Obtain Blood Cx x2 and showed no growth to date at 4 Days,  -Also checked urinalysis and urine culture as well as chest x-ray; UA showed a hazy appearance with large hemoglobin, rare bacteria, 6-10 RBCs per high-power field, 6-10 WBCs and urine culture showed multiple species -CXR yesterday showed "Unchanged AP portable examination with heterogeneous opacity at the left lung base, consistent with infection,  atelectasis, and/or scarring. No new airspace opacity. Mild cardiomegaly" -Fungus culture showing no growth to date at 3 Days -CT of the abdomen pelvis done 09/10/2019 showed "Percutaneous pigtail drain in the gallbladder fossa. No residual fluid collection/abscess. Status post cholecystectomy. Indwelling common duct stent with pneumobilia.Small pleural effusions.  Associated lower atelectasis." -Resumed with Zosyn and temperature is improved and leukocytosis is trending down -Continue to Monitor and Trend Temperature Curve and WBC -Repeat CBC in AM and follow Cx's  -Added DuonEb 3 mL q6hprn Wheezing and SOB -Added Guaifenesin 1200 mg po BID, and Will continue Flutter Valve and Incentive Spirometry  -Now the patient is currently off of supplemental oxygen via nasal cannula  Obesity -Estimated body mass index is 37.3 kg/m as calculated from the following:   Height as of this encounter: 5\' 2"  (1.575 m).   Weight as of this encounter: 92.5 kg. -Weight Loss and Dietary Counseling given   Constipation -C/w Miralax 17 grams BID -C/w Senna-Docusate 1 tab po BID for now -Patient states that she is probably having some improvement in her bowel regimen  Blistering/Rash in the genital area and buttocks -Likely in the setting of urine and feces; patient has had stress incontinence and states that she is having very large bowel movement and she sat in yesterday -I have asked the wound nurse  to evaluate -Currently is on antibiotics so do not feel this is anything bacterial -WOC still to evaluate   Mild Renal Insufficiency -In the setting of Diuresis -BUN/Cr is now 18/1.03 -Continue to Monitor and Trend -Repeat CMP in AM   DVT prophylaxis: Anticoagulated with Apixaban 5 mg po BID  Code Status: FULL CODE  Family Communication: No family  Disposition Plan: Pending Clinical Improvement and Clearance by General Surgery and IR   Consultants:  -General Surgery -Gastroenterology -Interventional  Radiology   Procedures:  Pt s/p lap chole 10/15; now with GB fossa fluid collection/biloma, s/p drain placement 10/23;s/p ERCP with removal of CBD stone/ CBD stent 10/27  Drain Injection 09/10/2019 demonstrated persistent communication btwn decompressed GB fossa fluid collection and biliary tree with 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; JP bulb converted to gravity bag  Antimicrobials:  Anti-infectives (From admission, onward)   Start     Dose/Rate Route Frequency Ordered Stop   09/15/19 1000  fluconazole (DIFLUCAN) tablet 100 mg     100 mg Oral Daily 09/14/19 1445     09/11/19 1200  piperacillin-tazobactam (ZOSYN) IVPB 3.375 g     3.375 g 12.5 mL/hr over 240 Minutes Intravenous Every 8 hours 09/11/19 1129     09/10/19 1200  fluconazole (DIFLUCAN) IVPB 100 mg  Status:  Discontinued     100 mg 50 mL/hr over 60 Minutes Intravenous Every 24 hours 09/10/19 1033 09/14/19 1445   08/31/19 1200  piperacillin-tazobactam (ZOSYN) IVPB 3.375 g  Status:  Discontinued     3.375 g 12.5 mL/hr over 240 Minutes Intravenous Every 8 hours 08/31/19 0743 09/08/19 1456   08/30/19 0400  piperacillin-tazobactam (ZOSYN) IVPB 2.25 g  Status:  Discontinued     2.25 g 100 mL/hr over 30 Minutes Intravenous Every 8 hours 08/29/19 1747 08/31/19 0743   08/29/19 1800  piperacillin-tazobactam (ZOSYN) IVPB 3.375 g     3.375 g 100 mL/hr over 30 Minutes Intravenous  Once 08/29/19 1745 08/29/19 2043     Subjective: Seen and examined at bedside complaining about right upper quadrant pain.  Also worried about her pneumonia but is not having any symptoms.  No lightheadedness or dizziness.  States that she thinks the blisters are improving and states that her "bowels are working now".  Denies any lightheadedness or dizziness.  No other concerns up at this time wishes that she could get up to get to the bedside commode  independently.  Objective: Vitals:   09/14/19 0538 09/14/19 0820 09/14/19 1400 09/15/19 0536  BP: (!) 118/48 116/63 (!) 108/50 113/69  Pulse: 87 64 78 71  Resp: 16  18 16   Temp: 98.8 F (37.1 C) 97.9 F (36.6 C) 98.4 F (36.9 C) 98 F (36.7 C)  TempSrc: Oral Oral Oral Oral  SpO2: 94% 94% 98% 94%  Weight:    92.5 kg  Height:        Intake/Output Summary (Last 24 hours) at 09/15/2019 1651 Last data filed at 09/15/2019 0640 Gross per 24 hour  Intake 100 ml  Output 701 ml  Net -601 ml   Filed Weights   09/13/19 0500 09/14/19 0508 09/15/19 0536  Weight: 93.2 kg 90.9 kg 92.5 kg   Examination: Physical Exam:  Constitutional: WN/WD obese Caucasian female in NAD and appears anxious still Eyes: Lids and conjunctivae normal, sclerae anicteric  ENMT: External Ears, Nose appear normal. Grossly normal hearing. Mucous membranes are moist.  Neck: Appears normal, supple,  no cervical masses, normal ROM, no appreciable thyromegaly; no JVD Respiratory: Diminished to auscultation bilaterally, no wheezing, rales, rhonchi or crackles. Normal respiratory effort and patient is not tachypenic. No accessory muscle use. Unlabored breathing and not wearing supplemental O2 via [place] Cardiovascular: Irregularly Irregular, no murmurs / rubs / gallops. S1 and S2 auscultated. Trace extremity edema. Abdomen: Soft, Slightly tender in RUQ, Distended. Bowel sounds positive x4.  GU: Deferred. Musculoskeletal: No clubbing / cyanosis of digits/nails. No joint deformity upper and lower extremities.   Skin: Warm and Dry Neurologic: CN 2-12 grossly intact with no focal deficits. Romberg sign cerebellar reflexes not assessed.  Psychiatric: Normal judgment and insight. Alert and oriented x 3. Slightly anxious mood and appropriate affect.   Data Reviewed: I have personally reviewed following labs and imaging studies  CBC: Recent Labs  Lab 09/11/19 0459 09/12/19 0629 09/13/19 0538 09/14/19 0548 09/15/19 0924  WBC  18.4* 15.2* 10.3 7.3 7.7  NEUTROABS  --  10.3* 5.8 3.8 4.3  HGB 10.5* 10.2* 9.4* 9.8* 10.7*  HCT 31.9* 31.9* 30.1* 30.7* 33.3*  MCV 95.5 97.9 96.8 95.6 95.4  PLT 667* 658* 640* 675* 412*   Basic Metabolic Panel: Recent Labs  Lab 09/11/19 0459 09/12/19 0629 09/13/19 0538 09/14/19 0548 09/15/19 0924  NA 135 137 135 136 136  K 3.9 3.7 3.9 3.8 3.6  CL 100 96* 95* 98 99  CO2 26 31 29 26 24   GLUCOSE 117* 103* 101* 105* 104*  BUN 22 19 21 21 18   CREATININE 0.93 0.95 0.94 0.93 1.03*  CALCIUM 8.8* 8.9 8.8* 8.9 9.2  MG  --  2.2 2.1 2.2 2.1  PHOS  --  3.5 4.0 4.0 4.0   GFR: Estimated Creatinine Clearance: 46.9 mL/min (A) (by C-G formula based on SCr of 1.03 mg/dL (H)). Liver Function Tests: Recent Labs  Lab 09/12/19 0629 09/13/19 0538 09/14/19 0548 09/15/19 0924  AST 21 20 22 23   ALT 19 15 17 8   ALKPHOS 92 90 93 96  BILITOT 0.7 1.0 0.5 0.6  PROT 6.9 6.5 6.6 7.3  ALBUMIN 2.6* 2.6* 2.5* 2.7*   No results for input(s): LIPASE, AMYLASE in the last 168 hours. No results for input(s): AMMONIA in the last 168 hours. Coagulation Profile: No results for input(s): INR, PROTIME in the last 168 hours. Cardiac Enzymes: No results for input(s): CKTOTAL, CKMB, CKMBINDEX, TROPONINI in the last 168 hours. BNP (last 3 results) No results for input(s): PROBNP in the last 8760 hours. HbA1C: No results for input(s): HGBA1C in the last 72 hours. CBG: No results for input(s): GLUCAP in the last 168 hours. Lipid Profile: No results for input(s): CHOL, HDL, LDLCALC, TRIG, CHOLHDL, LDLDIRECT in the last 72 hours. Thyroid Function Tests: No results for input(s): TSH, T4TOTAL, FREET4, T3FREE, THYROIDAB in the last 72 hours. Anemia Panel: No results for input(s): VITAMINB12, FOLATE, FERRITIN, TIBC, IRON, RETICCTPCT in the last 72 hours. Sepsis Labs: No results for input(s): PROCALCITON, LATICACIDVEN in the last 168 hours.  Recent Results (from the past 240 hour(s))  Body fluid culture      Status: None   Collection Time: 09/06/19  2:58 PM   Specimen: BILE; Body Fluid  Result Value Ref Range Status   Specimen Description   Final    BILE Performed at [facility] [address], [place], [place] [address]    Special Requests   Final    Normal Performed at [facility], [place] [address] [place], [place] [address]  Gram Stain   Final    NO WBC SEEN ABUNDANT YEAST Performed at [facility], [place] [address], [place], [place] [address]    Culture ABUNDANT CANDIDA PARAPSILOSIS  Final   Report Status 09/08/2019 FINAL  Final  Fungus culture, blood     Status: None (Preliminary result)   Collection Time: 09/11/19 12:09 PM   Specimen: BLOOD  Result Value Ref Range Status   Specimen Description   Final    BLOOD LEFT ARM Performed at [facility] [address] [place], [place] [address]    Special Requests   Final    BOTTLES DRAWN AEROBIC ONLY Blood Culture adequate volume Performed at [facility] [address] [place], [place] [address]    Culture   Final    NO GROWTH 4 DAYS Performed at [facility], [place] [address] [place], [place] [address]    Report Status PENDING  Incomplete  Culture, blood (routine x 2)     Status: None (Preliminary result)   Collection Time: 09/11/19 12:09 PM   Specimen: BLOOD  Result Value Ref Range Status   Specimen Description   Final    BLOOD LEFT ARM Performed at [facility] [address], [place], [place] [address]    Special Requests   Final    BOTTLES DRAWN AEROBIC AND ANAEROBIC Blood Culture adequate volume Performed at [facility] [address], [place], [place] [address]    Culture   Final    NO GROWTH 4 DAYS Performed at [facility], [place] [address], [place], [place] [address]    Report Status PENDING  Incomplete  Culture, blood (routine x 2)     Status: None (Preliminary result)    Collection Time: 09/11/19 12:15 PM   Specimen: BLOOD LEFT HAND  Result Value Ref Range Status   Specimen Description   Final    BLOOD LEFT HAND Performed at [facility] [address], [place], [place] [address]    Special Requests   Final    BOTTLES DRAWN AEROBIC AND ANAEROBIC Blood Culture adequate volume Performed at [facility] [address], [place], [place] [address]    Culture   Final    NO GROWTH 4 DAYS Performed at [facility], [place] [address], [place], [place] [address]    Report Status PENDING  Incomplete  Culture, Urine     Status: Abnormal   Collection Time: 09/11/19 11:45 PM   Specimen: Urine, Random  Result Value Ref Range Status   Specimen Description   Final    URINE, RANDOM Performed at [facility] [address] [place],  [address]    Special Requests   Final    NONE Performed at [facility], [place] [address], [place],  [address]    Culture MULTIPLE SPECIES PRESENT, SUGGEST RECOLLECTION (A)  Final   Report Status 09/12/2019 FINAL  Final    Radiology Studies: Dg Chest Port 1 View  Result Date: 09/15/2019 CLINICAL DATA:  Short of breath EXAM: PORTABLE CHEST 1 VIEW COMPARISON:  09/13/2019 FINDINGS: Unchanged AP portable examination with heterogeneous opacity at the left lung base, consistent with infection, atelectasis, and/or scarring. No new airspace opacity. Mild cardiomegaly. IMPRESSION: 1. Unchanged AP portable examination with heterogeneous opacity at the left lung base, consistent with infection, atelectasis, and/or scarring. No new airspace opacity. 2.  Mild cardiomegaly. Electronically Signed  By: Eddie Candle M.D.   On: 09/15/2019 13:58   Scheduled Meds: . allopurinol  100 mg Oral Daily  . apixaban  5 mg Oral BID  . diazepam  2.5 mg Oral QHS  . feeding supplement (ENSURE ENLIVE)  237 mL Oral BID BM  . fluconazole  100 mg Oral Daily  . furosemide   20 mg Oral Daily  . guaiFENesin  1,200 mg Oral BID  . mouth rinse  15 mL Mouth Rinse BID  . metoprolol succinate  100 mg Oral Daily  . pantoprazole  40 mg Oral Daily  . polyethylene glycol  17 g Oral BID  . potassium chloride  20 mEq Oral Daily  . senna-docusate  1 tablet Oral BID  . sodium chloride flush  5 mL Intracatheter Q8H   Continuous Infusions: . piperacillin-tazobactam (ZOSYN)  IV 3.375 g (09/15/19 1500)    LOS: 17 days    Kerney Elbe, DO [facility] PAGER is on [place]  If 7PM-7AM, please contact night-coverage [email or web address] Password [facility] 09/15/2019, 4:51 PM

## 2019-09-15 NOTE — Consult Note (Signed)
[facility] Nurse wound consult note Reason for Consult: moisture related skin injury secondary to fecal and urinary incontinence Wound type:IAD Pressure Injury POA: N/A Measurement:N/A. 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  Wound EVQ:WQVL, moist Drainage (amount, consistency, odor) scant serous Periwound: mild erythema Dressing procedure/placement/frequency: I will recommend a mattress replacement with low air loss feature to assist in the drying of tissues, a topical moisture barrier with the added value of an antifungal and cortisone (Gerhart's Butt cream). I have provided Nursing with some guidance for turning and repositioning.   Patient has already been started on a systemic antifungal.  [facility] nursing team will not follow, but will remain available to this patient, the nursing and medical teams.  Please re-consult if needed. Thanks, Maudie Flakes, MSN, RN, [place], [facility]  Pager# [phone]

## 2019-09-16 LAB — CBC WITH DIFFERENTIAL/PLATELET
Abs Immature Granulocytes: 0.09 10*3/uL — ABNORMAL HIGH (ref 0.00–0.07)
Basophils Absolute: 0.1 10*3/uL (ref 0.0–0.1)
Basophils Relative: 2 %
Eosinophils Absolute: 1.4 10*3/uL — ABNORMAL HIGH (ref 0.0–0.5)
Eosinophils Relative: 19 %
HCT: 32.4 % — ABNORMAL LOW (ref 36.0–46.0)
Hemoglobin: 10.4 g/dL — ABNORMAL LOW (ref 12.0–15.0)
Immature Granulocytes: 1 %
Lymphocytes Relative: 33 %
Lymphs Abs: 2.3 10*3/uL (ref 0.7–4.0)
MCH: 30.9 pg (ref 26.0–34.0)
MCHC: 32.1 g/dL (ref 30.0–36.0)
MCV: 96.1 fL (ref 80.0–100.0)
Monocytes Absolute: 0.7 10*3/uL (ref 0.1–1.0)
Monocytes Relative: 10 %
Neutro Abs: 2.4 10*3/uL (ref 1.7–7.7)
Neutrophils Relative %: 35 %
Platelets: 617 10*3/uL — ABNORMAL HIGH (ref 150–400)
RBC: 3.37 MIL/uL — ABNORMAL LOW (ref 3.87–5.11)
RDW: 13.6 % (ref 11.5–15.5)
WBC: 7 10*3/uL (ref 4.0–10.5)
nRBC: 0 % (ref 0.0–0.2)

## 2019-09-16 LAB — CULTURE, BLOOD (ROUTINE X 2)
Culture: NO GROWTH
Culture: NO GROWTH
Special Requests: ADEQUATE
Special Requests: ADEQUATE

## 2019-09-16 LAB — COMPREHENSIVE METABOLIC PANEL
ALT: 16 U/L (ref 0–44)
AST: 22 U/L (ref 15–41)
Albumin: 2.7 g/dL — ABNORMAL LOW (ref 3.5–5.0)
Alkaline Phosphatase: 91 U/L (ref 38–126)
Anion gap: 11 (ref 5–15)
BUN: 21 mg/dL (ref 8–23)
CO2: 25 mmol/L (ref 22–32)
Calcium: 9 mg/dL (ref 8.9–10.3)
Chloride: 100 mmol/L (ref 98–111)
Creatinine, Ser: 0.9 mg/dL (ref 0.44–1.00)
GFR calc Af Amer: >60 mL/min (ref >60)
GFR calc non Af Amer: >60 mL/min (ref >60)
Glucose, Bld: 94 mg/dL (ref 70–99)
Potassium: 3.7 mmol/L (ref 3.5–5.1)
Sodium: 136 mmol/L (ref 135–145)
Total Bilirubin: 0.6 mg/dL (ref 0.3–1.2)
Total Protein: 7 g/dL (ref 6.5–8.1)

## 2019-09-16 LAB — MAGNESIUM: Magnesium: 2.3 mg/dL (ref 1.7–2.4)

## 2019-09-16 LAB — PHOSPHORUS: Phosphorus: 4.3 mg/dL (ref 2.5–4.6)

## 2019-09-16 LAB — SARS CORONAVIRUS 2 (TAT 6-24 HRS): SARS Coronavirus 2: NEGATIVE

## 2019-09-16 MED ORDER — FLUCONAZOLE 100 MG PO TABS
100.0000 mg | ORAL_TABLET | Freq: Once | ORAL | Status: AC
  Administered 2019-09-16: 100 mg via ORAL
  Filled 2019-09-16: qty 1

## 2019-09-16 MED ORDER — FLUCONAZOLE 200 MG PO TABS: 200.0000 mg | ORAL_TABLET | Freq: Every day | ORAL | Status: DC

## 2019-09-16 MED ORDER — AMOXICILLIN-POT CLAVULANATE 875-125 MG PO TABS
1.0000 | ORAL_TABLET | Freq: Two times a day (BID) | ORAL | Status: DC
  Administered 2019-09-16 – 2019-09-17 (×2): 1 via ORAL
  Filled 2019-09-16 (×2): qty 1

## 2019-09-16 MED ORDER — FLUCONAZOLE 200 MG PO TABS
200.0000 mg | ORAL_TABLET | Freq: Every day | ORAL | Status: DC
  Administered 2019-09-17: 200 mg via ORAL
  Filled 2019-09-16: qty 1

## 2019-09-16 NOTE — Progress Notes (Addendum)
PROGRESS NOTE    Kristen [name]  FBP:[id number] DOB: Mar 11, 1941 DOA: 08/28/2019 PCP: [name], MD   Brief Narrative:  The patient is a 78 year old female with a history of anxiety, atrial fibrillation on anticoagulation with Eliquis, hypertension, CKD uncertain baseline who came to ED with abdominal pain.  Patient recently underwent laparoscopic cholecystectomy on 08/22/2019 and was recovering uneventfully back at home but started developing cervical pain of right upper quadrant with radiation towards right shoulder blade in the ED CT abdomen pelvis revealed fluid collection in the gallbladder fossa.  General surgery was consulted HIDA scan was done which confirmed biliary leak and no activity in the small bowel.  Patient underwent gallbladder fossa drain placement per IR and ERCP with biliary duct stone removal and stent placement per GI.  CT scan done yesterday no residual collection, but bile collection did show Candida and patient was started on antifungals.  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  IR recommending continuing maintaining drain for now and repeating follow-up injection in 1 week.  Because she spiked a temperature and had an elevated leukocytosis blood cultures were obtained and she is started on empiric antibiotics and now she is afebrile and white blood cell count is trending down.  Likely the patient has a left-sided pneumonia and question if this is from aspiration..  Patient continues to feel constipated so bowel regimen was added and she is improving.  Complaining of some blisters in her genital area likely in the setting of moisture so asked wound nurse to evaluate.  Patient is improving and I spoke with infectious diseases Dr. Tommy Medal who  recommends increasing fluconazole dose to 200 mg daily as well as discharge on Augmentin.  Patient is getting close to being discharged and we have repeated a COVID-19 test in order for her to go to skilled nursing facility.  Assessment & Plan:   Principal Problem:   Acute renal failure (ARF) (HCC) Active Problems:   Hypertensive heart disease   Permanent atrial fibrillation (HCC)   Acute on chronic diastolic CHF (congestive heart failure) (HCC)   Situational mixed anxiety and depressive disorder   Intraabdominal fluid collection   A-fib (HCC)   Right Upper Quadrant pain/Biliary Leak -HIDA scan suggested biliary leak after patient presents with right upper quadrant pain. -CT showed fluid collection in the gallbladder fossa.   -Biliary drain placement per IR yielded significant amount of fluid.   -ERCP showed choledocholithiasis, underwent removal of stone and stent placement.  GI has signed off.   -Patient was empirically started on IV Zosyn, and had completed 10 days of IV Zosyn but will resume now given fevers and worsened WBC.    Will continue Zosyn for 5 days for HCAP and today is last day but I spoke to infectious diseases Dr. Tommy Medal today who recommends continuing Augmentin at discharge for at least a month as well as fluconazole -Repeat CT abdomen today shows percutaneous pigtail drain in gallbladder fossa, no residual fluid collection/abscess but she did spike a temperature yesterday -IR Recommending continuing Drain for at least another week -Bile showed Candida Parapsilosis and she is on fluconazole; and she has gotten 5 days of fluconazole and this was changed to p.o. now and dose was increased to 200 mg p.o. daily for at least a month based on infectious  disease recommendation -Pan Cx as below and likely patient has a pneumonia -Blood cultures x2 and fungus culture showed no growth at 5 Days so far and urine culture was negative -IR recommending continuing the drain and  discontinuing drain flushes and repeating drain injection 1 to 2 weeks -Continued to have some intermittent abdominal pain. But will defer to surgery and IR for further evaluation -IR recommending no flushing of the drain  Post ERCP Pancreatitis -Patient developed post ERCP pancreatitis with lipase to 732 which improved to 92. -Her diet has been advanced to solid food which she has been tolerating.    Acute Respiratory Failure with Hypoxia in the setting of CHF and LLL PNA Acute on Chronic Diastolic CHF, improving and off of oxygen -Patient developed acute on chronic diastolic CHF exacerbation, secondary to fluid overload.   -Echocardiogram showed EF 60 to 65% improved with IV Lasix but now back on po Lasix  -Currently she is on Lasix 20 mg p.o. daily.  And will continue  -She was on 2 L of oxygen via nasal cannula.  Continue to wean off oxygen as able and was weaned off and not wearing any supplemental oxygen via nasal cannula -Repeat CXR this AM showed "Unchanged AP portable examination with heterogeneous opacity at the left lung base, consistent with infection, atelectasis, and/or scarring. No new airspace opacity. Mild cardiomegaly." -Strict I's/O's and Daily Weights; Patient is - 9.775 Liters  -Continue to Monitor Respirator Status Carefully  A. Fib with RVR, improved  -CHA2DS2VASc score of at least 5 patient went into A. fib in the ED.  -She was started on Cardizem drip which has been stopped.   -She is on Eliquis for anticoagulation.   -Continue Metoprolol Succinate 100 mg po Daily for rate control. -No longer on Telemetry   Hypertension -Blood pressure is stable but on the lower side and was 106/52  Hypokalemia -Potassium now 3.6 -In the setting of Diuresis -Continue to Monitor and Replete as Necessary -Repeat CMP in AM   GERD -C/w PPI  Depression and Anxiety -C/w Diazepam 2.5 mg po qHS   Fever and Leukocytosis, likely in the setting of Healthcare associated LLL  pneumonia and possible bile leak fluid collection, improving  -Pan Culture -Had a T-max of 101.8 a few days ago and WBC went from 8.7 -> 18.4 and worsened; today it is 7.0 and normalized after restarting IV Zosyn; will continue Zosyn for today and I spoke with infectious diseases and Dr. Drucilla Schmidt recommends changing to p.o. Augmentin and increasing the dose of fluconazole to 200 mg p.o. daily -Obtain Blood Cx x2 and showed no growth to date at 5 Days,  -Also checked urinalysis and urine culture as well as chest x-ray; UA showed a hazy appearance with large hemoglobin, rare bacteria, 6-10 RBCs per high-power field, 6-10 WBCs and urine culture showed multiple species -CXR yesterday showed "Unchanged AP portable examination with heterogeneous opacity at the left lung base, consistent with infection, atelectasis, and/or scarring. No new airspace opacity. Mild cardiomegaly" -Fungus culture showing no growth to date at 3 Days -CT of the abdomen pelvis done 09/10/2019 showed "Percutaneous pigtail drain in the gallbladder fossa. No residual fluid collection/abscess. Status post cholecystectomy. Indwelling common duct stent with pneumobilia.Small pleural effusions.  Associated lower atelectasis." -Resume antibiotics with IV Zosyn and temperature and leukocytosis have improved.  Will transition to p.o. Augmentin and Fluconazole at discharge -Continue to Monitor and Trend Temperature Curve and WBC -Repeat CBC in AM and follow Cx's  -Added DuonEb  3 mL q6hprn Wheezing and SOB -Added Guaifenesin 1200 mg po BID, and Will continue Flutter Valve and Incentive Spirometry  -Now the patient is currently off of supplemental oxygen via nasal cannula  Obesity -Estimated body mass index is 36.17 kg/m as calculated from the following:   Height as of this encounter: 5\' 2"  (1.575 m).   Weight as of this encounter: 89.7 kg. -Weight Loss and Dietary Counseling given   Constipation -C/w Miralax 17 grams BID -C/w  Senna-Docusate 1 tab po BID for now -Continue to monitor and will need a bowel regimen at discharge  Blistering/Rash in the genital area and buttocks in the setting of incontinence associated dermatitis -Likely in the setting of urine and feces; patient has had stress incontinence and states that she is having very large bowel movement and she sat in yesterday -I have asked the wound nurse to evaluate -Currently is on antibiotics so do not feel this is anything bacterial -WOC evaluated I appreciate her evaluation and recommends Gerhardt's Butt cream  Mild Renal Insufficiency, improved -In the setting of Diuresis -BUN/Cr is now 21/0.90 -Continue to Monitor and Trend -Repeat CMP in AM   Thrombocytosis -Patient's platelet count has been elevated and likely in the setting of antibiotics and infection from the bile leak -Platelet count went from 658 -> 640 -> 675 -> 688 -> 617 -Continue to monitor and trend and follow-up in outpatient setting with hematology if still elevated after completion of her antibiotics  DVT prophylaxis: Anticoagulated with Apixaban 5 mg po BID  Code Status: FULL CODE  Family Communication: No family  Disposition Plan: Pending Clinical Improvement and Clearance by General Surgery and IR   Consultants:  -General Surgery -Gastroenterology -Interventional Radiology -Discussed case with Dr. 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; JP bulb converted to gravity bag  Antimicrobials:  Anti-infectives (From admission, onward)   Start     Dose/Rate Route Frequency Ordered Stop   09/15/19 1000   fluconazole (DIFLUCAN) tablet 100 mg     100 mg Oral Daily 09/14/19 1445     09/11/19 1200  piperacillin-tazobactam (ZOSYN) IVPB 3.375 g     3.375 g 12.5 mL/hr over 240 Minutes Intravenous Every 8 hours 09/11/19 1129 09/17/19 0359   09/10/19 1200  fluconazole (DIFLUCAN) IVPB 100 mg  Status:  Discontinued     100 mg 50 mL/hr over 60 Minutes Intravenous Every 24 hours 09/10/19 1033 09/14/19 1445   08/31/19 1200  piperacillin-tazobactam (ZOSYN) IVPB 3.375 g  Status:  Discontinued     3.375 g 12.5 mL/hr over 240 Minutes Intravenous Every 8 hours 08/31/19 0743 09/08/19 1456   08/30/19 0400  piperacillin-tazobactam (ZOSYN) IVPB 2.25 g  Status:  Discontinued     2.25 g 100 mL/hr over 30 Minutes Intravenous Every 8 hours 08/29/19 1747 08/31/19 0743   08/29/19 1800  piperacillin-tazobactam (ZOSYN) IVPB 3.375 g     3.375 g 100 mL/hr over 30 Minutes Intravenous  Once 08/29/19 1745 08/29/19 2043     Subjective: Seen and examined at bedside and she was sitting in a chair with no complaints.  States that her breathing is fine and states that the  blistering and dermatitis is getting better.  No nausea or vomiting.  No lightheadedness or dizziness.  States her buttocks was hurting concerning the chair too long yesterday.  No other concerns or complaints at this time and hopeful to go to a skilled nursing facility next 24 to 48 hours.  Objective: Vitals:   09/14/19 1400 09/15/19 0536 09/15/19 2100 09/16/19 0547  BP: (!) 108/50 113/69 (!) 106/52   Pulse: 78 71 70 74  Resp: 18 16 17 19   Temp: 98.4 F (36.9 C) 98 F (36.7 C) 98.5 F (36.9 C) 97.6 F (36.4 C)  TempSrc: Oral Oral Oral Oral  SpO2: 98% 94% 99% 96%  Weight:  92.5 kg  89.7 kg  Height:        Intake/Output Summary (Last 24 hours) at 09/16/2019 1448 Last data filed at 09/16/2019 1042 Gross per 24 hour  Intake 240 ml  Output 1650 ml  Net -1410 ml   Filed Weights   09/14/19 0508 09/15/19 0536 09/16/19 0547  Weight: 90.9 kg 92.5 kg  89.7 kg   Examination: Physical Exam:  Constitutional: WN/WD obese Caucasian female in NAD and appears calm sitting in chair bedside Eyes:  Lids and conjunctivae normal, sclerae anicteric  ENMT: External Ears, Nose appear normal. Grossly normal hearing. Mucous membranes are moist.  Neck: Appears normal, supple, no cervical masses, normal ROM, no appreciable thyromegaly; no JVD Respiratory: Diminished to auscultation bilaterally slightly more so on the Left compared to the right, no wheezing, rales, rhonchi or crackles. Normal respiratory effort and patient is not tachypenic. No accessory muscle use. Unlabored breathing with no accessory muscle use Cardiovascular: RRR, no murmurs / rubs / gallops. S1 and S2 auscultated. No extremity edema.  Abdomen: Soft, non-tender, Distended. RUQ Drain in place draining brown liquid. Bowel sounds positive.  GU: Deferred. Musculoskeletal: No clubbing / cyanosis of digits/nails. No joint deformity upper and lower extremities.   Skin: No rashes, lesions, ulcers on a limited skin evaluation. No induration; Warm and dry.  Neurologic: CN 2-12 grossly intact with no focal deficits. Romberg sign and cerebellar reflexes not assessed.  Psychiatric: Normal judgment and insight. Alert and oriented x 3. Normal mood and appropriate affect.   Data Reviewed: I have personally reviewed following labs and imaging studies  CBC: Recent Labs  Lab 09/12/19 0629 09/13/19 0538 09/14/19 0548 09/15/19 0924 09/16/19 0539  WBC 15.2* 10.3 7.3 7.7 7.0  NEUTROABS 10.3* 5.8 3.8 4.3 2.4  HGB 10.2* 9.4* 9.8* 10.7* 10.4*  HCT 31.9* 30.1* 30.7* 33.3* 32.4*  MCV 97.9 96.8 95.6 95.4 96.1  PLT 658* 640* 675* 688* 431*   Basic Metabolic Panel: Recent Labs  Lab 09/12/19 0629 09/13/19 0538 09/14/19 0548 09/15/19 0924 09/16/19 0539  NA 137 135 136 136 136  K 3.7 3.9 3.8 3.6 3.7  CL 96* 95* 98 99 100  CO2 31 29 26 24 25   GLUCOSE 103* 101* 105* 104* 94  BUN 19 21 21 18 21    CREATININE 0.95 0.94 0.93 1.03* 0.90  CALCIUM 8.9 8.8* 8.9 9.2 9.0  MG 2.2 2.1 2.2 2.1 2.3  PHOS 3.5 4.0 4.0 4.0 4.3   GFR: Estimated Creatinine Clearance: 52.7 mL/min (by C-G formula based on SCr of 0.9 mg/dL). Liver Function Tests: Recent Labs  Lab 09/12/19 0629 09/13/19 0538 09/14/19 0548 09/15/19 0924 09/16/19 0539  AST 21 20 22 23 22   ALT 19 15 17 8 16   ALKPHOS 92 90 93 96 91  BILITOT 0.7 1.0 0.5 0.6 0.6  PROT 6.9 6.5 6.6 7.3 7.0  ALBUMIN 2.6* 2.6* 2.5* 2.7* 2.7*   No results for input(s): LIPASE, AMYLASE in the last 168 hours. No results for input(s): AMMONIA in the last 168 hours. Coagulation Profile: No results for input(s): INR, PROTIME in the last 168 hours. Cardiac Enzymes: No results for input(s): CKTOTAL, CKMB, CKMBINDEX, TROPONINI in the last 168 hours. BNP (last 3 results) No results for input(s): PROBNP in the last 8760 hours. HbA1C: No results for input(s): HGBA1C in the last 72 hours. CBG: No results for input(s): GLUCAP in the last 168 hours. Lipid Profile: No results for input(s): CHOL, HDL, LDLCALC, TRIG, CHOLHDL, LDLDIRECT in the last 72 hours. Thyroid Function Tests: No results for input(s): TSH, T4TOTAL, FREET4, T3FREE, THYROIDAB in the last 72 hours. Anemia Panel: No results for input(s): VITAMINB12, FOLATE, FERRITIN, TIBC, IRON, RETICCTPCT in the last 72 hours. Sepsis Labs: No results for input(s): PROCALCITON, LATICACIDVEN in the last 168 hours.  Recent Results (from the past 240 hour(s))  Body fluid culture     Status: None   Collection Time: 09/06/19  2:58 PM   Specimen: BILE; Body Fluid  Result Value Ref Range Status   Specimen Description   Final    BILE Performed at [facility] [address] [place], [place] [address]    Special Requests   Final    Normal Performed at [facility], [place] [address], [place], [place] [address]    Gram Stain   Final    NO WBC SEEN ABUNDANT  YEAST Performed at [facility], [place] [address], [place], [place] [address]    Culture ABUNDANT CANDIDA PARAPSILOSIS  Final   Report Status 09/08/2019 FINAL  Final  Fungus culture, blood     Status: None (Preliminary result)   Collection Time: 09/11/19 12:09 PM   Specimen: BLOOD  Result Value Ref Range Status   Specimen Description   Final    BLOOD LEFT ARM Performed at [facility] [address] [place], [place] [address]    Special Requests   Final    BOTTLES DRAWN AEROBIC ONLY Blood Culture adequate volume Performed at [facility] [address], [place], [place] [address]    Culture   Final    NO GROWTH 5 DAYS Performed at [facility], [place] [address], [place], [place] [address]    Report Status PENDING  Incomplete  Culture, blood (routine x 2)     Status: None   Collection Time: 09/11/19 12:09 PM   Specimen: BLOOD  Result Value Ref Range Status   Specimen Description   Final    BLOOD LEFT ARM Performed at [facility] [address], [place], [place] [address]    Special Requests   Final    BOTTLES DRAWN AEROBIC AND ANAEROBIC Blood Culture adequate volume Performed at [facility] [address], [place], [place] [address]    Culture   Final    NO GROWTH 5 DAYS Performed at [facility], [place] [address] [place], [place] [address]    Report Status 09/16/2019 FINAL  Final  Culture, blood (routine x 2)     Status: None   Collection Time: 09/11/19 12:15 PM   Specimen: BLOOD LEFT HAND  Result Value Ref Range Status   Specimen Description   Final    BLOOD LEFT HAND Performed at [facility] [address] [place], [place]  [address]    Special Requests   Final    BOTTLES DRAWN AEROBIC AND ANAEROBIC Blood Culture adequate volume Performed at [facility] [address] [place], [place] [address]    Culture   Final    NO GROWTH  5 DAYS Performed at [facility], [place] [address], [place], [place] [address]    Report Status 09/16/2019 FINAL  Final  Culture, Urine     Status: Abnormal   Collection Time: 09/11/19 11:45 PM   Specimen: Urine, Random  Result Value Ref Range Status   Specimen Description   Final    URINE, RANDOM Performed at [facility] [address], [place], [place] [address]    Special Requests   Final    NONE Performed at [facility], [place] [address] [place], [place] [address]    Culture MULTIPLE SPECIES PRESENT, SUGGEST RECOLLECTION (A)  Final   Report Status 09/12/2019 FINAL  Final    Radiology Studies: Dg Chest Port 1 View  Result Date: 09/15/2019 CLINICAL DATA:  Short of breath EXAM: PORTABLE CHEST 1 VIEW COMPARISON:  09/13/2019 FINDINGS: Unchanged AP portable examination with heterogeneous opacity at the left lung base, consistent with infection, atelectasis, and/or scarring. No new airspace opacity. Mild cardiomegaly. IMPRESSION: 1. Unchanged AP portable examination with heterogeneous opacity at the left lung base, consistent with infection, atelectasis, and/or scarring. No new airspace opacity. 2.  Mild cardiomegaly. Electronically Signed   By: Eddie Candle M.D.   On: 09/15/2019 13:58   Scheduled Meds: . allopurinol  100 mg Oral Daily  . apixaban  5 mg Oral BID  . diazepam  2.5 mg Oral QHS  . feeding supplement (ENSURE ENLIVE)  237 mL Oral BID BM  . fluconazole  100 mg Oral Daily  . furosemide  20 mg Oral Daily  . Gerhardt's butt cream   Topical QID  . guaiFENesin  1,200 mg Oral BID  . mouth rinse  15 mL Mouth Rinse BID  . metoprolol succinate  100 mg Oral Daily  . pantoprazole  40 mg Oral Daily  . polyethylene glycol  17 g Oral BID  . potassium chloride  20 mEq Oral Daily  . senna-docusate  1 tablet Oral BID  . sodium chloride flush  5 mL Intracatheter Q8H   Continuous Infusions: . piperacillin-tazobactam (ZOSYN)  IV 3.375 g  (09/16/19 1333)    LOS: 18 days    Kerney Elbe, DO [facility] PAGER is on [place]  If 7PM-7AM, please contact night-coverage [email or web address] Password [facility] 09/16/2019, 2:48 PM

## 2019-09-16 NOTE — Progress Notes (Signed)
Occupational Therapy Treatment Patient Details Name: Kristen [name] MRN: [id number] DOB: 11/29/1940 Today's Date: 09/16/2019    History of present illness Pt is s/p laparoscopic cholecystectomy on 08/22/2019 and then had biliary leak and no activity in the small bowel. Pt s/p biliary drain 10/23 and s/p ERCP 10/27. PMH: A fib, CHF, mixed anxiety and depressive disorder   OT comments  Pt with good participation this day.  Pt plans to go to SNF for rehab  Follow Up Recommendations  SNF    Equipment Recommendations  3 in 1 bedside commode    Recommendations for Other Services      Precautions / Restrictions Restrictions Weight Bearing Restrictions: Yes RLE Weight Bearing: Weight bearing as tolerated LLE Weight Bearing: Weight bearing as tolerated       Mobility Bed Mobility Overal bed mobility: Needs Assistance Bed Mobility: Supine to Sit     Supine to sit: Min guard;HOB elevated        Transfers   Equipment used: Rolling walker (2 wheeled)   Sit to Stand: From elevated surface;Min assist         General transfer comment: cues for upright posture, stood with RW while NT assisted with self care due to BM.    Balance Overall balance assessment: Needs assistance Sitting-balance support: Feet supported;Bilateral upper extremity supported Sitting balance-Leahy Scale: Good     Standing balance support: Bilateral upper extremity supported Standing balance-Leahy Scale: Fair                 High Level Balance Comments: static standing min guard assist with RW during self care by NT           ADL either performed or assessed with clinical judgement   ADL Overall ADL's : Needs assistance/impaired     Grooming: Set up;Brushing hair;Sitting;Wash/dry face;Oral care               Lower Body Dressing: Moderate assistance;Sit to/from stand;Cueing for safety;Cueing for compensatory techniques   Toilet Transfer: Moderate assistance;Stand-pivot;Cueing  for safety;Ambulation   Toileting- Clothing Manipulation and Hygiene: Moderate assistance;Sit to/from stand;Cueing for safety;Cueing for compensatory techniques                        Frequency  Min 2X/week        Progress Toward Goals  OT Goals(current goals can now be found in the care plan section)  Progress towards OT goals: Progressing toward goals     Plan Discharge plan remains appropriate       AM-PAC OT "6 Clicks" Daily Activity     Outcome Measure   Help from another person eating meals?: None Help from another person taking care of personal grooming?: A Little Help from another person toileting, which includes using toliet, bedpan, or urinal?: A Lot Help from another person bathing (including washing, rinsing, drying)?: A Lot Help from another person to put on and taking off regular upper body clothing?: A Little Help from another person to put on and taking off regular lower body clothing?: Total 6 Click Score: 15    End of Session Equipment Utilized During Treatment: Rolling walker;Gait belt  OT Visit Diagnosis: Unsteadiness on feet (R26.81);Muscle weakness (generalized) (M62.81)   Activity Tolerance Patient tolerated treatment well   Patient Left with call bell/phone within reach;in bed;with bed alarm set   Nurse Communication Mobility status        Time: 9371-6967 OT Time Calculation (min): 27 min  Charges: OT  General Charges $OT Visit: 1 Visit OT Treatments $Self Care/Home Management : 8-22 mins  Kari Baars, [place] Pager407 [phone] Office- [phone], Edwena Felty D 09/16/2019, 7:07 PM

## 2019-09-16 NOTE — Progress Notes (Signed)
Referring Physician(s): Outlaw,W  Supervising Physician: Aletta Edouard  Patient Status:  [facility] - In-pt  Chief Complaint:  Abdominal pain  Subjective: Pt doing ok; has some mild RUQ discomfort, more so at prior surgical site than drain site; occ back pain   Allergies: Flecainide acetate  Medications: Prior to Admission medications   Medication Sig Start Date End Date Taking? Authorizing Provider  allopurinol (ZYLOPRIM) 100 MG tablet TAKE 1 TABLET BY MOUTH EVERY DAY Patient taking differently: Take 100 mg by mouth daily.  06/24/19  Yes [name], MD  calcium carbonate (OSCAL) 1500 (600 Ca) MG TABS tablet Take 1,500 mg by mouth daily.    Yes [provider]  calcium carbonate (TUMS - DOSED IN MG ELEMENTAL CALCIUM) 500 MG chewable tablet Chew 1 tablet by mouth as needed for indigestion or heartburn.   Yes [provider]  cholecalciferol (VITAMIN D3) 25 MCG (1000 UT) tablet Take 1,000 Units by mouth daily.   Yes [provider]  CINNAMON PO Take 1 tablet by mouth daily.    Yes [provider]  clindamycin (CLINDAGEL) 1 % gel APPLY TO AFFECTED AREA TWICE A DAY Patient taking differently: Apply 1 application topically 2 (two) times daily.  04/13/18  Yes [name], MD  colchicine 0.6 MG tablet Take 1 tablet (0.6 mg total) by mouth 2 (two) times daily. Take twice daily until pain resolves and then take as needed for gout flare. Patient taking differently: Take 0.6 mg by mouth 2 (two) times daily as needed. Take twice daily until pain resolves and then take as needed for gout flare. 06/15/18  Yes [name], MD  [employer] ANTI-FUNGAL 2 % powder APPLY TO AFFECTED AREA TWICE A DAY AFTER SKIN RESOLVES FROM KETOCONAZOLE CREAM Patient taking differently: as needed.  10/17/14  Yes [name], MD  diazepam (VALIUM) 5 MG tablet TAKE 1/2 TABLET (2.5 MG TOTAL) BY MOUTH AT BEDTIME. Patient taking differently: Take 2.5 mg by mouth at bedtime.  07/08/19   Yes [name], MD  doxycycline (VIBRAMYCIN) 50 MG capsule TAKE 2 CAPSULES BY MOUTH 2 (TWO) TIMES DAILY. Patient taking differently: Take 100 mg by mouth See admin instructions. Twice daily As directed for deer tick's (allergic reaction) 04/25/18  Yes [name], MD  ELIQUIS 5 MG TABS tablet TAKE 1 TABLET BY MOUTH TWICE A DAY Patient taking differently: Take 5 mg by mouth 2 (two) times daily.  05/23/19  Yes Park Liter, MD  Flax Oil-Fish Oil-Borage Oil (FISH-FLAX-BORAGE) CAPS Take 1 capsule by mouth daily.   Yes [provider]  furosemide (LASIX) 20 MG tablet Take 1 tablet (20 mg total) by mouth daily. Patient taking differently: Take 20 mg by mouth daily.  08/19/19  Yes Park Liter, MD  glucosamine-chondroitin (GLUCOSAMINE-CHONDROITIN DS) 500-400 MG tablet Take 1 tablet by mouth daily.    Yes [provider]  HYDROcodone-acetaminophen (NORCO/VICODIN) 5-325 MG tablet Take 1 tablet by mouth every 6 (six) hours as needed for moderate pain. 08/22/19  Yes [name], [name], MD  ibuprofen (ADVIL) 800 MG tablet Take 1 tablet (800 mg total) by mouth every 8 (eight) hours as needed. 08/22/19  Yes [name], [name], MD  lisinopril-hydrochlorothiazide (ZESTORETIC) 20-25 MG tablet Take 1 tablet by mouth daily. Patient taking differently: Take 1 tablet by mouth daily.  03/08/19  Yes [name], MD  loratadine (CLARITIN) 10 MG tablet Take 1 tablet (10 mg total) by mouth daily. 07/08/19  Yes [name], MD  metoprolol succinate (TOPROL-XL) 100 MG 24 hr tablet Take 1 tablet (100 mg total) by mouth daily. Take with or immediately following a meal. Patient taking differently: Take 100 mg by mouth daily. Take with or immediately following a meal. 01/29/19 08/28/19 Yes Park Liter, MD  Multiple Vitamins-Minerals (EMERGEN-C IMMUNE) PACK Take 1 tablet by mouth daily.    Yes [provider]  Multiple Vitamins-Minerals (MULTIVITAMIN WOMEN 50+) TABS  Take 1 tablet by mouth daily.    Yes [provider]  mupirocin ointment (BACTROBAN) 2 % APPLY TOPICALLY TO AFFECTED AREA TWICE DAILY Patient taking differently: as needed.  02/20/19  Yes [name], MD  Nutritional Supplements (ECHINACEA/GOLDEN SEAL PO) Take 1 tablet by mouth daily.    Yes [provider]  polycarbophil (FIBERCON) 625 MG tablet Take 625 mg by mouth daily.   Yes [provider]  potassium chloride (KLOR-CON) 20 MEQ packet MIX 1 PACKET IN LIQUID AND TAKE BY MOUTH DAILY AS DIRECTED Patient taking differently: Take 20 mEq by mouth daily. MIX 1 PACKET IN LIQUID AND TAKE BY MOUTH DAILY AS DIRECTED 02/20/19  Yes [name], MD  Probiotic Product (PROBIOTIC-10 PO) Take 1 tablet by mouth daily.    Yes [provider]  TURMERIC PO Take 1 tablet by mouth daily.    Yes [provider]  alendronate (FOSAMAX) 10 MG tablet TAKE (1) TABLET BY MOUTH PER WEEK TAKE ON EMPTY STOMACH WITH FULL GLASS OF WATER 09/11/19   [name], MD     Vital Signs: BP (!) 123/56   Pulse 63   Temp 97.8 F (36.6 C) (Oral)   Resp 18   Ht 5\' 2"  (1.575 m)   Wt 197 lb 12 oz (89.7 kg)   SpO2 95%   BMI 36.17 kg/m   Physical Exam awake/alert; RUQ drain intact, insertion site ok, mildly tender, output 50 cc dark yellow granular fluid  Imaging: Dg Chest Port 1 View  Result Date: 09/15/2019 CLINICAL DATA:  Short of breath EXAM: PORTABLE CHEST 1 VIEW COMPARISON:  09/13/2019 FINDINGS: Unchanged AP portable examination with heterogeneous opacity at the left lung base, consistent with infection, atelectasis, and/or scarring. No new airspace opacity. Mild cardiomegaly. IMPRESSION: 1. Unchanged AP portable examination with heterogeneous opacity at the left lung base, consistent with infection, atelectasis, and/or scarring. No new airspace opacity. 2.  Mild cardiomegaly. Electronically Signed   By: Eddie Candle M.D.   On: 09/15/2019 13:58   Dg Chest Port 1  View  Result Date: 09/13/2019 CLINICAL DATA:  Shortness of breath. EXAM: PORTABLE CHEST 1 VIEW COMPARISON:  09/11/2019 FINDINGS: Cardiomediastinal contours remain enlarged. Signs of left lower lobe airspace disease as before. No dense consolidation. No acute bone finding. IMPRESSION: No change in the appearance of the chest since prior study. Electronically Signed   By: Zetta Bills M.D.   On: 09/13/2019 10:03    Labs:  CBC: Recent Labs    09/13/19 0538 09/14/19 0548 09/15/19 0924 09/16/19 0539  WBC 10.3 7.3 7.7 7.0  HGB 9.4* 9.8* 10.7* 10.4*  HCT 30.1* 30.7* 33.3* 32.4*  PLT 640* 675* 688* 617*    COAGS: Recent Labs    08/29/19 1805 08/31/19 1226  INR 1.1 1.1  APTT  --  32    BMP: Recent Labs    09/13/19 0538 09/14/19 0548 09/15/19 0924 09/16/19 0539  NA 135 136 136 136  K 3.9 3.8 3.6 3.7  CL 95* 98 99 100  CO2 29 26 24  25  GLUCOSE 101* 105* 104* 94  BUN 21 21 18 21   CALCIUM 8.8* 8.9 9.2 9.0  CREATININE 0.94 0.93 1.03* 0.90  GFRNONAA 58* 58* 52* >60  GFRAA >60 >60 60* >60    LIVER FUNCTION TESTS: Recent Labs    09/13/19 0538 09/14/19 0548 09/15/19 0924 09/16/19 0539  BILITOT 1.0 0.5 0.6 0.6  AST 20 22 23 22   ALT 15 17 8 16   ALKPHOS 90 93 96 91  PROT 6.5 6.6 7.3 7.0  ALBUMIN 2.6* 2.5* 2.7* 2.7*    Assessment and Plan: Pt s/p lap chole 10/15; post op GB fossa fluid collection/biloma, s/p drain placement 10/23;s/p ERCP with removal of CBD stone/ CBD stent 10/27; afebrile; WBC nl; hgb stable; drain injection 11/3  demonstrated persistent communication btwn decompressed GB fossa fluid collection and biliary tree with 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 Rowe Robert, PA-C 09/16/2019, 4:28 PM   I spent a total of 15 minutes at the the patient's bedside AND on the patient's hospital floor or unit, greater than 50% of which was counseling/coordinating care for gallbladder fossa drain    Patient ID: Kristen [name], female   DOB: [birth year], 78 y.o.   MRN: [id number]

## 2019-09-16 NOTE — NC FL2 (Signed)
[place] LEVEL OF CARE SCREENING TOOL     IDENTIFICATION  Patient Name: Kristen [name] Birthdate: Aug 13, 1941 Sex: female Admission Date (Current Location): 08/28/2019  [facility] and [place] Number:  Herbalist and Address:  [facility],  [address] [place], [place]      Provider Number: [id number]  Attending Physician Name and Address:  Kerney Elbe, DO  Relative Name and Phone Number:       Current Level of Care: Hospital Recommended Level of Care: [place] Prior Approval Number:    Date Approved/Denied:   PASRR Number: [id number]  Discharge Plan: ([place])    Current Diagnoses: Patient Active Problem List   Diagnosis Date Noted  . Acute renal failure (ARF) ([place]) 08/29/2019  . Intraabdominal fluid collection 08/29/2019  . A-fib ([place]) 08/29/2019  . Varicose veins of leg with edema, right 05/21/2019  . Ankle swelling, right 05/21/2019  . Bunion of great toe of right foot 05/21/2019  . Osteoarthritis 03/13/2018  . Gait abnormality 03/13/2018  . Skin lesion 02/13/2018  . 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 Abnormal Pap smear of cervix 02/08/2016  . Chronic kidney disease 02/08/2016  . Urinary frequency 02/08/2016  . History of diverticulitis 01/22/2016  . Anxiety state 10/17/2014  . Long-term (current) use of anticoagulants 06/10/2014  . Chronic venous insufficiency 06/10/2014  . Obesity (BMI 30-39.9)   . Acute on chronic diastolic CHF (congestive heart failure) ([place])   . [place] intertrigo 06/09/2014  . Nocturnal leg cramps 04/13/2012  . Insomnia 01/10/2011  . Permanent atrial fibrillation ([place])   . OSTEOPENIA 01/31/2008  . Hypertensive heart disease     Orientation RESPIRATION BLADDER Height & Weight     Self, Time, Situation, Place  Normal Incontinent Weight: 89.7 kg Height:  5\' 2"  (157.5 cm)  BEHAVIORAL SYMPTOMS/MOOD NEUROLOGICAL BOWEL  NUTRITION STATUS      Incontinent Diet(soft diet)  AMBULATORY STATUS COMMUNICATION OF NEEDS Skin   Extensive Assist Verbally Other (Comment)(See below)                       Personal Care Assistance Level of Assistance  Bathing, Feeding, Dressing Bathing Assistance: Maximum assistance Feeding assistance: Independent Dressing Assistance: Limited assistance     Functional Limitations Info  Sight, Hearing, Speech Sight Info: Adequate Hearing Info: Adequate Speech Info: Adequate    SPECIAL CARE FACTORS FREQUENCY  PT (By licensed PT), OT (By licensed OT)     PT Frequency: 5x OT Frequency: 5x            Contractures Contractures Info: Not present    Additional Factors Info  Code Status, Allergies Code Status Info: full code Allergies Info: Flecainide Acetate           Current Medications (09/16/2019):  This is the current hospital active medication list Current Facility-Administered Medications  Medication Dose Route Frequency Provider Last Rate Last Dose  . acetaminophen (TYLENOL) tablet 650 mg  650 mg Oral Q6H PRN Clarene Essex, MD   650 mg at 09/15/19 2155   Or  . acetaminophen (TYLENOL) suppository 650 mg  650 mg Rectal Q6H PRN Clarene Essex, MD      . allopurinol (ZYLOPRIM) tablet 100 mg  100 mg Oral Daily Clarene Essex, MD   100 mg at 09/16/19 1037  . alum & mag hydroxide-simeth (MAALOX/MYLANTA) 200-200-20 MG/5ML suspension 30 mL  30 mL Oral Q6H PRN Oswald Hillock, MD  30 mL at 09/10/19 1706  . apixaban (ELIQUIS) tablet 5 mg  5 mg Oral BID Marcell Anger, MD   5 mg at 09/16/19 1037  . diazepam (VALIUM) tablet 2.5 mg  2.5 mg Oral QHS Clarene Essex, MD   2.5 mg at 09/15/19 2156  . feeding supplement (ENSURE ENLIVE) (ENSURE ENLIVE) liquid 237 mL  237 mL Oral BID BM [name], [name], MD   237 mL at 09/16/19 1331  . fentaNYL (SUBLIMAZE) injection 25-50 mcg  25-50 mcg Intravenous Q2H PRN Clarene Essex, MD   50 mcg at 09/11/19 0636  . fluconazole (DIFLUCAN)  tablet 100 mg  100 mg Oral Daily Leodis Sias T, RPH   100 mg at 09/16/19 1037  . furosemide (LASIX) tablet 20 mg  20 mg Oral Daily Kathie Dike, MD   20 mg at 09/16/19 1037  . Gerhardt's butt cream   Topical QID Sheikh, Georgina Quint Latif, DO      . gu[facility]) 12 hr tablet 1,200 mg  1,200 mg Oral BID Raiford Noble Latif, DO   1,200 mg at 09/16/19 1037  . ipratropium-albuterol (DUONEB) 0.5-2.5 (3) MG/3ML nebulizer solution 3 mL  3 mL Nebulization Q6H PRN Marcell Anger, MD   3 mL at 09/05/19 1853  . MEDLINE mouth rinse  15 mL Mouth Rinse BID Clarene Essex, MD   15 mL at 09/16/19 1038  . metoprolol succinate (TOPROL-XL) 24 hr tablet 100 mg  100 mg Oral Daily Clarene Essex, MD   100 mg at 09/16/19 1038  . ondansetron (ZOFRAN) injection 4 mg  4 mg Intravenous Q6H PRN Clarene Essex, MD   4 mg at 09/10/19 1706  . pantoprazole (PROTONIX) EC tablet 40 mg  40 mg Oral Daily Brahmbhatt, Parag, MD   40 mg at 09/16/19 1037  . piperacillin-tazobactam (ZOSYN) IVPB 3.375 g  3.375 g Intravenous Q8H Sheikh, Georgina Quint [place], DO 12.5 mL/hr at 09/16/19 1333 3.375 g at 09/16/19 1333  . polyethylene glycol (MIRALAX / GLYCOLAX) packet 17 g  17 g Oral BID Raiford Noble [place], DO   17 g at 09/15/19 2158  . polyvinyl alcohol (LIQUIFILM TEARS) 1.4 % ophthalmic solution 1 drop  1 drop Both Eyes PRN Clarene Essex, MD   1 drop at 09/10/19 2244  . potassium chloride (KLOR-CON) packet 20 mEq  20 mEq Oral Daily Oswald Hillock, MD   20 mEq at 09/16/19 1039  . senna-docusate (Senokot-S) tablet 1 tablet  1 tablet Oral BID Raiford Noble [place], DO   1 tablet at 09/14/19 2120  . sodium chloride flush (NS) 0.9 % injection 5 mL  5 mL Intracatheter Q8H Clarene Essex, MD   5 mL at 09/16/19 1333     Discharge Medications: Please see discharge summary for a list of discharge medications.  Relevant Imaging Results:  Relevant Lab Results:   Additional Information SS# [id number],  Skin care from above-moisture related skin injury  secondary to fecal and urinary incontinence-low air-loss mattress replacement and application of [place], LCSW

## 2019-09-16 NOTE — Progress Notes (Signed)
Physical Therapy Treatment Patient Details Name: Kristen [name] MRN: [id number] DOB: 21-May-1941 Today's Date: 09/16/2019    History of Present Illness Pt is s/p laparoscopic cholecystectomy on 08/22/2019 and then had biliary leak and no activity in the small bowel. Pt s/p biliary drain 10/23 and s/p ERCP 10/27. PMH: A fib, CHF, mixed anxiety and depressive disorder    PT Comments    Pt is progressing with mobility. Pt is very motivated to get stronger and took an active interest in wanting more exercises that she could do on her own. Pt given a LE HEP. Pt is min guard assist with bed mobility and basic transfers from an elevated surface. Pt ambulated to the recliner with min assist. Pt is limited by deconditioning and will continue to benefit from acute PT to maximize mobility and Independence until d/c to next venue of care.  Follow Up Recommendations  SNF     Equipment Recommendations  None recommended by PT    Recommendations for Other Services       Precautions / Restrictions Precautions Precautions: Fall Precaution Comments: drain R side abdomen    Mobility  Bed Mobility Overal bed mobility: Needs Assistance Bed Mobility: Supine to Sit     Supine to sit: Min guard;HOB elevated     General bed mobility comments: use of bed rails and increased time  Transfers Overall transfer level: Needs assistance Equipment used: Rolling walker (2 wheeled) Transfers: Sit to/from Stand Sit to Stand: Min guard;From elevated surface Stand pivot transfers: Min guard       General transfer comment: cues for upright posture, stood with RW while NT assisted with self care due to BM.  Ambulation/Gait Ambulation/Gait assistance: Min assist Gait Distance (Feet): 3 Feet Assistive device: Rolling walker (2 wheeled) Gait Pattern/deviations: Step-to pattern;Trunk flexed Gait velocity: decreased       Stairs             Wheelchair Mobility    Modified Rankin (Stroke  Patients Only)       Balance Overall balance assessment: Needs assistance Sitting-balance support: Feet supported;Bilateral upper extremity supported Sitting balance-Leahy Scale: Good     Standing balance support: Bilateral upper extremity supported Standing balance-Leahy Scale: Fair                 High Level Balance Comments: static standing min guard assist with RW during self care by NT            Cognition Arousal/Alertness: Awake/alert Behavior During Therapy: WFL for tasks assessed/performed Overall Cognitive Status: Within Functional Limits for tasks assessed                                 General Comments: Pt was soiled and assisted with self care prior to transfering.      Exercises General Exercises - Lower Extremity Ankle Circles/Pumps: AROM;Both;10 reps;Seated Long Arc Quad: AROM;Strengthening;Both;10 reps;Seated Heel Slides: AROM;Strengthening;Both;Seated Hip ABduction/ADduction: AROM;Strengthening;Both;10 reps;Seated Straight Leg Raises: AROM;Strengthening;Both;10 reps;Seated Hip Flexion/Marching: AROM;Strengthening;Both;10 reps;Seated    General Comments        Pertinent Vitals/Pain Pain Score: 2  Pain Location: L side and back Pain Descriptors / Indicators: Discomfort Pain Intervention(s): Limited activity within patient's tolerance    Home Living                      Prior Function            PT Goals (  current goals can now be found in the care plan section) Progress towards PT goals: Progressing toward goals    Frequency    Min 2X/week      PT Plan Current plan remains appropriate    Co-evaluation              AM-PAC PT "6 Clicks" Mobility   Outcome Measure  Help needed turning from your back to your side while in a flat bed without using bedrails?: A Little Help needed moving from lying on your back to sitting on the side of a flat bed without using bedrails?: A Little Help needed moving  to and from a bed to a chair (including a wheelchair)?: A Little Help needed standing up from a chair using your arms (e.g., wheelchair or bedside chair)?: A Little Help needed to walk in hospital room?: A Lot Help needed climbing 3-5 steps with a railing? : A Lot 6 Click Score: 16    End of Session Equipment Utilized During Treatment: Gait belt Activity Tolerance: Patient tolerated treatment well Patient left: in chair;with chair alarm set Nurse Communication: Mobility status PT Visit Diagnosis: Muscle weakness (generalized) (M62.81);Pain;Difficulty in walking, not elsewhere classified (R26.2)     Time: 7416-3845 PT Time Calculation (min) (ACUTE ONLY): 36 min  Charges:  $Gait Training: 8-22 mins $Therapeutic Exercise: 8-22 mins                       Lelon Mast 09/16/2019, 9:36 AM

## 2019-09-16 NOTE — TOC Progression Note (Signed)
Transition of Care [facility]) - Progression Note    Patient Details  Name: Kristen [name] MRN: [id number] Date of Birth: 04/06/1941  Transition of Care [facility]) CM/SW [place], [place] Phone Number: 09/16/2019, 2:48 PM  Clinical Narrative:   After consultation with Dr., resubmitted request for authorization to [facility] in anticipation of d/c by Wednesday, Thursday at latest.  Dr also ordered COVID test.    Expected Discharge Plan: [place] Barriers to Discharge: Insurance Authorization  Expected Discharge Plan and Services Expected Discharge Plan: [place] In-house Referral: Clinical Social Work     Living arrangements for the past 2 months: Mobile Home                                       Social Determinants of Health (SDOH) Interventions    Readmission Risk Interventions No flowsheet data found.

## 2019-09-17 DIAGNOSIS — R109 Unspecified abdominal pain: Secondary | ICD-10-CM | POA: Diagnosis not present

## 2019-09-17 DIAGNOSIS — I4821 Permanent atrial fibrillation: Secondary | ICD-10-CM | POA: Diagnosis not present

## 2019-09-17 DIAGNOSIS — I10 Essential (primary) hypertension: Secondary | ICD-10-CM | POA: Diagnosis not present

## 2019-09-17 DIAGNOSIS — R0602 Shortness of breath: Secondary | ICD-10-CM | POA: Diagnosis not present

## 2019-09-17 DIAGNOSIS — K651 Peritoneal abscess: Secondary | ICD-10-CM | POA: Diagnosis not present

## 2019-09-17 DIAGNOSIS — Z4803 Encounter for change or removal of drains: Secondary | ICD-10-CM | POA: Diagnosis not present

## 2019-09-17 DIAGNOSIS — Z9049 Acquired absence of other specified parts of digestive tract: Secondary | ICD-10-CM | POA: Diagnosis not present

## 2019-09-17 DIAGNOSIS — Z79899 Other long term (current) drug therapy: Secondary | ICD-10-CM | POA: Diagnosis not present

## 2019-09-17 DIAGNOSIS — T8143XA Infection following a procedure, organ and space surgical site, initial encounter: Secondary | ICD-10-CM | POA: Diagnosis not present

## 2019-09-17 DIAGNOSIS — R6889 Other general symptoms and signs: Secondary | ICD-10-CM | POA: Diagnosis not present

## 2019-09-17 DIAGNOSIS — R12 Heartburn: Secondary | ICD-10-CM | POA: Diagnosis not present

## 2019-09-17 DIAGNOSIS — E559 Vitamin D deficiency, unspecified: Secondary | ICD-10-CM | POA: Diagnosis not present

## 2019-09-17 DIAGNOSIS — A499 Bacterial infection, unspecified: Secondary | ICD-10-CM | POA: Diagnosis not present

## 2019-09-17 DIAGNOSIS — K5900 Constipation, unspecified: Secondary | ICD-10-CM | POA: Diagnosis not present

## 2019-09-17 DIAGNOSIS — Z7901 Long term (current) use of anticoagulants: Secondary | ICD-10-CM | POA: Diagnosis not present

## 2019-09-17 DIAGNOSIS — I110 Hypertensive heart disease with heart failure: Secondary | ICD-10-CM | POA: Diagnosis not present

## 2019-09-17 DIAGNOSIS — D649 Anemia, unspecified: Secondary | ICD-10-CM | POA: Diagnosis not present

## 2019-09-17 DIAGNOSIS — I5033 Acute on chronic diastolic (congestive) heart failure: Secondary | ICD-10-CM | POA: Diagnosis not present

## 2019-09-17 DIAGNOSIS — R279 Unspecified lack of coordination: Secondary | ICD-10-CM | POA: Diagnosis not present

## 2019-09-17 DIAGNOSIS — N179 Acute kidney failure, unspecified: Secondary | ICD-10-CM | POA: Diagnosis not present

## 2019-09-17 DIAGNOSIS — E785 Hyperlipidemia, unspecified: Secondary | ICD-10-CM | POA: Diagnosis not present

## 2019-09-17 DIAGNOSIS — K30 Functional dyspepsia: Secondary | ICD-10-CM | POA: Diagnosis not present

## 2019-09-17 DIAGNOSIS — I4891 Unspecified atrial fibrillation: Secondary | ICD-10-CM | POA: Diagnosis not present

## 2019-09-17 DIAGNOSIS — I119 Hypertensive heart disease without heart failure: Secondary | ICD-10-CM | POA: Diagnosis not present

## 2019-09-17 DIAGNOSIS — R188 Other ascites: Secondary | ICD-10-CM | POA: Diagnosis not present

## 2019-09-17 DIAGNOSIS — R197 Diarrhea, unspecified: Secondary | ICD-10-CM | POA: Diagnosis not present

## 2019-09-17 DIAGNOSIS — A0472 Enterocolitis due to Clostridium difficile, not specified as recurrent: Secondary | ICD-10-CM | POA: Diagnosis not present

## 2019-09-17 DIAGNOSIS — J9601 Acute respiratory failure with hypoxia: Secondary | ICD-10-CM | POA: Diagnosis not present

## 2019-09-17 DIAGNOSIS — Z743 Need for continuous supervision: Secondary | ICD-10-CM | POA: Diagnosis not present

## 2019-09-17 DIAGNOSIS — M109 Gout, unspecified: Secondary | ICD-10-CM | POA: Diagnosis not present

## 2019-09-17 DIAGNOSIS — Z438 Encounter for attention to other artificial openings: Secondary | ICD-10-CM | POA: Diagnosis not present

## 2019-09-17 DIAGNOSIS — K9189 Other postprocedural complications and disorders of digestive system: Secondary | ICD-10-CM | POA: Diagnosis not present

## 2019-09-17 DIAGNOSIS — N19 Unspecified kidney failure: Secondary | ICD-10-CM | POA: Diagnosis not present

## 2019-09-17 MED ORDER — HYDROCODONE-ACETAMINOPHEN 5-325 MG PO TABS: 1.0000 | ORAL_TABLET | Freq: Four times a day (QID) | ORAL | 0 refills | Status: DC | PRN

## 2019-09-17 MED ORDER — POLYETHYLENE GLYCOL 3350 17 G PO PACK: 17.0000 g | PACK | Freq: Two times a day (BID) | ORAL | 0 refills | Status: DC

## 2019-09-17 MED ORDER — IPRATROPIUM-ALBUTEROL 0.5-2.5 (3) MG/3ML IN SOLN: 3.0000 mL | Freq: Four times a day (QID) | RESPIRATORY_TRACT | Status: DC | PRN

## 2019-09-17 MED ORDER — SENNOSIDES-DOCUSATE SODIUM 8.6-50 MG PO TABS: 1.0000 | ORAL_TABLET | Freq: Two times a day (BID) | ORAL | Status: DC

## 2019-09-17 MED ORDER — GUAIFENESIN ER 600 MG PO TB12: 1200.0000 mg | ORAL_TABLET | Freq: Two times a day (BID) | ORAL | 0 refills | Status: AC

## 2019-09-17 MED ORDER — LISINOPRIL-HYDROCHLOROTHIAZIDE 20-25 MG PO TABS: 0.5000 | ORAL_TABLET | Freq: Every day | ORAL | 1 refills | Status: DC

## 2019-09-17 MED ORDER — AMOXICILLIN-POT CLAVULANATE 875-125 MG PO TABS: 1.0000 | ORAL_TABLET | Freq: Two times a day (BID) | ORAL | 0 refills | Status: AC

## 2019-09-17 MED ORDER — FLUCONAZOLE 200 MG PO TABS: 200.0000 mg | ORAL_TABLET | Freq: Every day | ORAL | 0 refills | Status: DC

## 2019-09-17 MED ORDER — DIAZEPAM 5 MG PO TABS: ORAL_TABLET | ORAL | 0 refills | Status: DC

## 2019-09-17 MED ORDER — PANTOPRAZOLE SODIUM 40 MG PO TBEC: 40.0000 mg | DELAYED_RELEASE_TABLET | Freq: Every day | ORAL | Status: DC

## 2019-09-17 NOTE — Discharge Summary (Signed)
Physician Discharge Summary  Kristen [name] JIR:[id number] DOB: 02-Jul-1941 DOA: 08/28/2019  PCP: [name], MD  Admit date: 08/28/2019 Discharge date: 09/17/2019  Admitted From: Home Disposition: SNF  Recommendations for Outpatient Follow-up:  1. Follow up with PCP in 1-2 weeks 2. Follow up with IR at [facility] on 09/24/2019 for Drain injection and  3. Please obtain CMP/CBC, Mag, Phos in one week 4. Please follow up on the following pending results:  Home Health: No  Equipment/Devices: None   Discharge Condition: Stable CODE STATUS: FULL CODE  Diet recommendation:   Brief/Interim Summary: The patient is a 78 year old female with a history of anxiety, atrial fibrillation on anticoagulation with Eliquis, hypertension, CKD uncertain baseline who came to ED with abdominal pain. Patient recently underwent laparoscopic cholecystectomy on 08/22/2019 and was recovering uneventfully back at home but started developing cervical pain of right upper quadrant with radiation towards right shoulder blade in the ED CT abdomen pelvis revealed fluid collection in the gallbladder fossa. General surgery was consulted HIDA scan was done which confirmed biliary leak and no activity in the small bowel. Patient underwent gallbladder fossa drain placement per IR and ERCP with biliary duct stone removal and stent placement per GI.  CT scan done yesterday no residual collection, but bile collection did show Candida and patient was started on antifungals.  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  IR recommending continuing maintaining drain for now and repeating follow-up injection in 1 week.  Because she spiked a temperature and had an elevated leukocytosis blood cultures were obtained and  she is started on empiric antibiotics and now she is afebrile and white blood cell count is trending down.  Likely the patient has a left-sided pneumonia and question if this is from aspiration..  Patient continues to feel constipated so bowel regimen was added and she is improving.  Complaining of some blisters in her genital area likely in the setting of moisture so asked wound nurse to evaluate.  Patient is improving and I spoke with infectious diseases Dr. Tommy Medal who recommends increasing fluconazole dose to 200 mg daily as well as discharge on Augmentin.  Patient is getting close to being discharged and we have repeated a COVID-19 test in order for her to go to skilled nursing facility.  Discharge Diagnoses:  Principal Problem:   Acute renal failure (ARF) (HCC) Active Problems:   Hypertensive heart disease   Permanent atrial fibrillation (HCC)   Acute on chronic diastolic CHF (congestive heart failure) (HCC)   Situational mixed anxiety and depressive disorder   Intraabdominal fluid collection   A-fib (HCC)  Right Upper Quadrant pain/Biliary Leak -HIDA scan suggested biliary leak after patient presents with right upper quadrant pain. -CT showed fluid collection in the gallbladder fossa.  -Biliary drain placement per IR yielded significant amount of fluid.  -ERCP showed choledocholithiasis, underwent removal of stone and stent placement. GI has signed off.  -Patient was empirically started on IV Zosyn, and had completed 10 days of IV Zosyn but will resume now given fevers and worsened WBC. Continued IV Zosyn for 5 days for HCAP and today is last day but I spoke to infectious diseases Dr. Tommy Medal yesterday who recommends continuing Augmentin at discharge for at least a month as well as fluconazole -Repeat CT abdomen today shows percutaneous pigtail drain in  gallbladder fossa, no residual fluid collection/abscess but she did spike a temperature yesterday -IR Recommending continuing Drain  for at least another week -Bile showed Candida Parapsilosis and she is on fluconazole; and she has gotten 5 days of fluconazole and this was changed to p.o. now and dose was increased to 200 mg p.o. daily for at least a month based on infectious disease recommendation -Pan Cx as below and likely patient has a pneumonia -Blood cultures x2 and fungus culture showed no growth at 5 Days so far and urine culture was negative -IR recommending continuing the drain and discontinuing drain flushes and repeating drain injection 1 to 2 weeks; this has been set up for 09/24/2019 -Continued to have some intermittent abdominal pain but this is improved.  Will defer to surgery and IR for further evaluation and imaging -Follow up with general surgery, interventional radiology as well as infectious diseases within 1 to 2 weeks  Post ERCP Pancreatitis -Patient developed post ERCP pancreatitis with lipase to 732 which improved to 92. -Her diet has been advanced to solid food which she has been tolerating.  -Continue to monitor and trend for abdominal pain  Acute Respiratory Failure with Hypoxia in the setting of CHF and LLL PNA Acute on Chronic Diastolic CHF, improving and off of oxygen -Patient developed acute on chronic diastolic CHF exacerbation, secondary to fluid overload.  -Echocardiogram showed EF 60 to 65% improved with IV Lasix but now back on po Lasix  -Currently she is on Lasix 20 mg p.o. daily.  And will continue -She was on 2 L of oxygen via nasal cannula. Continue to wean off oxygen as able and was weaned off and not wearing any supplemental oxygen via nasal cannula -Repeat CXR yesterday AM showed "Unchanged AP portable examination with heterogeneous opacity at the left lung base, consistent with infection, atelectasis, and/or scarring. No new airspace opacity. Mild cardiomegaly." -Strict I's/O's and Daily Weights; Patient is - 10.167 Liters  -Continue to Monitor Respirator Status  Carefully -Follow up with Cardiology within 1-2 weeks  -Repeat chest x-ray in 3 to 6 weeks  A. Fib with RVR, improved  -CHA2DS2VASc score of at least 5 patient went into A. fib in the ED.  -She was started on Cardizem drip which has been stopped.  -She is on Eliquis for anticoagulation.  -Continue Metoprolol Succinate 100 mg po Daily for rate control. -No longer on Telemetry   Hypertension -Blood pressure is stable but on the lower side and was 100/61  -We will resume home blood pressure medications but at half the dose  Hypokalemia -Potassiumnow 3.7 yesterday -In the setting of Diuresis -Continue to Monitor and Replete as Necessary and resumed home Potassium Supplementation  -Repeat CMP in AM   GERD -C/w PPI 40 mg po Daily   Depression and Anxiety -C/w Diazepam 2.5 mg po qHS at D/C   Fever and Leukocytosis, likely in the setting of Healthcare associated LLL pneumonia and Bile leak fluid collection, improving  -Pan Culture -Had a T-max of 101.8 a few days ago and WBC went from 8.7 -> 18.4 and worsened; today it is 7.0 and normalized after restarting IV Zosyn; will continue Zosyn for today and I spoke with infectious diseases and Dr. Drucilla Schmidt recommends changing to p.o. Augmentin and increasing the dose of fluconazole to 200 mg p.o. daily -Obtain Blood Cx x2 and showed no growth to date at 5 Days,  -Also checked urinalysis and urine culture as well as chest x-ray; UA showed a hazy appearance  with large hemoglobin, rare bacteria, 6-10 RBCs per high-power field, 6-10 WBCs and urine culture showed multiple species -CXR yesterday showed "Unchanged AP portable examination with heterogeneous opacity at the left lung base, consistent with infection, atelectasis, and/or scarring. No new airspace opacity. Mild cardiomegaly" -Fungus culture showing no growth to date at 3 Days -CT of the abdomen pelvis done 09/10/2019 showed "Percutaneous pigtail drain in the gallbladder fossa. No  residual fluid collection/abscess. Status post cholecystectomy. Indwelling common duct stent with pneumobilia.Small pleural effusions. Associated lower atelectasis." -Resumed antibiotics with IV Zosyn and temperature and leukocytosis have improved and have transitioned to p.o. Augmentin and Fluconazole at discharge -Continue to Monitor and Trend Temperature Curve and WBC -Repeat CBC in AM and follow Cx's  -Added DuonEb 3 mL q6hprn Wheezing and SOB -Added Guaifenesin 1200 mg po BID, and Will continue Flutter Valve and Incentive Spirometry  -Now the patient is currently off of supplemental oxygen via nasal cannula -Repeat CXR in 3-6 weeks  Obesity -Estimated body mass index is 35.08 kg/m as calculated from the following:   Height as of this encounter: 5\' 2"  (1.575 m).   Weight as of this encounter: 87 kg. -Weight Loss and Dietary Counseling given   Constipation -C/w Miralax 17 grams BID -C/w Senna-Docusate 1 tab po BID for now -Continue to monitor and will need a bowel regimen at discharge -Resume home Polycarbophil 625 mg po Daily   Blistering/Rash in the genital area and buttocks in the setting of incontinence associated dermatitis -Likely in the setting of urine and feces; patient has had stress incontinence and states that she is having very large bowel movement and she sat in yesterday -I have asked the wound nurse to evaluate -Currently is on antibiotics so do not feel this is anything bacterial -WOC evaluated I appreciate her evaluation and recommends Gerhardt's Butt cream and will continue at D/C   Mild Renal Insufficiency, improved -In the setting of Diuresis -BUN/Cr is now 21/0.90 and stable -Continue to Monitor and Trend -Repeat CMP within 1 week and ok to resume home Lisinopril HCTZ combination but resume at half the dose  -Follow Renal Fxn in the Outpatient setting   Thrombocytosis -Patient's platelet count has been elevated and likely in the setting of  antibiotics and infection from the bile leak -Platelet count went from 658 -> 640 -> 675 -> 688 -> 617 yesterday -Continue to monitor and trend and follow-up in outpatient setting with hematology if still elevated after completion of her antibiotics  Discharge Instructions Discharge Instructions    Call MD for:  difficulty breathing, headache or visual disturbances   Complete by: As directed    Call MD for:  extreme fatigue   Complete by: As directed    Call MD for:  hives   Complete by: As directed    Call MD for:  persistant dizziness or light-headedness   Complete by: As directed    Call MD for:  persistant nausea and vomiting   Complete by: As directed    Call MD for:  redness, tenderness, or signs of infection (pain, swelling, redness, odor or green/yellow discharge around incision site)   Complete by: As directed    Call MD for:  severe uncontrolled pain   Complete by: As directed    Call MD for:  temperature >100.4   Complete by: As directed    Diet - low sodium heart healthy   Complete by: As directed    Discharge instructions   Complete by: As directed  You were cared for by a hospitalist during your hospital stay. If you have any questions about your discharge medications or the care you received while you were in the hospital after you are discharged, you can call the unit and ask to speak with the hospitalist on call if the hospitalist that took care of you is not available. Once you are discharged, your primary care physician will handle any further medical issues. Please note that NO REFILLS for any discharge medications will be authorized once you are discharged, as it is imperative that you return to your primary care physician (or establish a relationship with a primary care physician if you do not have one) for your aftercare needs so that they can reassess your need for medications and monitor your lab values.  Follow up with PCP, General Surgery, IR, and Infectious  Diseases as an outpatient. Take all medications as prescribed. If symptoms change or worsen please return to the ED for evaluation   Increase activity slowly   Complete by: As directed      Allergies as of 09/17/2019      Reactions   Flecainide Acetate Other (See Comments)   REACTION: SOB, swelling (hospitalized)      Medication List    STOP taking these medications   doxycycline 50 MG capsule Commonly known as: VIBRAMYCIN   ibuprofen 800 MG tablet Commonly known as: ADVIL     TAKE these medications   alendronate 10 MG tablet Commonly known as: FOSAMAX TAKE (1) TABLET BY MOUTH PER WEEK TAKE ON EMPTY STOMACH WITH FULL GLASS OF WATER What changed:   how much to take  how to take this  when to take this   allopurinol 100 MG tablet Commonly known as: ZYLOPRIM TAKE 1 TABLET BY MOUTH EVERY DAY   amoxicillin-clavulanate 875-125 MG tablet Commonly known as: AUGMENTIN Take 1 tablet by mouth every 12 (twelve) hours.   calcium carbonate 1500 (600 Ca) MG Tabs tablet Commonly known as: OSCAL Take 1,500 mg by mouth daily.   calcium carbonate 500 MG chewable tablet Commonly known as: TUMS - dosed in mg elemental calcium Chew 1 tablet by mouth as needed for indigestion or heartburn.   cholecalciferol 25 MCG (1000 UT) tablet Commonly known as: VITAMIN D3 Take 1,000 Units by mouth daily.   CINNAMON PO Take 1 tablet by mouth daily.   clindamycin 1 % gel Commonly known as: CLINDAGEL APPLY TO AFFECTED AREA TWICE A DAY What changed:   how much to take  how to take this  when to take this  additional instructions   colchicine 0.6 MG tablet Take 1 tablet (0.6 mg total) by mouth 2 (two) times daily. Take twice daily until pain resolves and then take as needed for gout flare. What changed:   when to take this  reasons to take this   [facility] Anti-Fungal 2 % powder Generic drug: miconazole APPLY TO AFFECTED AREA TWICE A DAY AFTER SKIN RESOLVES FROM KETOCONAZOLE  CREAM What changed: See the new instructions.   diazepam 5 MG tablet Commonly known as: VALIUM TAKE 1/2 TABLET (2.5 MG TOTAL) BY MOUTH AT BEDTIME. What changed: See the new instructions.   ECHINACEA/GOLDEN SEAL PO Take 1 tablet by mouth daily.   Eliquis 5 MG Tabs tablet Generic drug: apixaban TAKE 1 TABLET BY MOUTH TWICE A DAY What changed: how much to take   Fish-Flax-Borage Caps Take 1 capsule by mouth daily.   fluconazole 200 MG tablet Commonly known as: DIFLUCAN Take  1 tablet (200 mg total) by mouth daily. Start taking on: September 18, 2019   furosemide 20 MG tablet Commonly known as: LASIX Take 1 tablet (20 mg total) by mouth daily.   Glucosamine-Chondroitin DS 500-400 MG tablet Generic drug: glucosamine-chondroitin Take 1 tablet by mouth daily.   guaiFENesin 600 MG 12 hr tablet Commonly known as: MUCINEX Take 2 tablets (1,200 mg total) by mouth 2 (two) times daily for 5 days.   HYDROcodone-acetaminophen 5-325 MG tablet Commonly known as: NORCO/VICODIN Take 1 tablet by mouth every 6 (six) hours as needed for moderate pain.   ipratropium-albuterol 0.5-2.5 (3) MG/3ML Soln Commonly known as: DUONEB Take 3 mLs by nebulization every 6 (six) hours as needed.   lisinopril-hydrochlorothiazide 20-25 MG tablet Commonly known as: ZESTORETIC Take 1 tablet by mouth daily.   loratadine 10 MG tablet Commonly known as: CLARITIN Take 1 tablet (10 mg total) by mouth daily.   metoprolol succinate 100 MG 24 hr tablet Commonly known as: TOPROL-XL Take 1 tablet (100 mg total) by mouth daily. Take with or immediately following a meal.   Multivitamin Women 50+ Tabs Take 1 tablet by mouth daily. What changed: Another medication with the same name was removed. Continue taking this medication, and follow the directions you see here.   mupirocin ointment 2 % Commonly known as: BACTROBAN APPLY TOPICALLY TO AFFECTED AREA TWICE DAILY What changed: See the new instructions.    pantoprazole 40 MG tablet Commonly known as: PROTONIX Take 1 tablet (40 mg total) by mouth daily. Start taking on: September 18, 2019   polycarbophil 625 MG tablet Commonly known as: FIBERCON Take 625 mg by mouth daily.   polyethylene glycol 17 g packet Commonly known as: MIRALAX / GLYCOLAX Take 17 g by mouth 2 (two) times daily.   potassium chloride 20 MEQ packet Commonly known as: KLOR-CON MIX 1 PACKET IN LIQUID AND TAKE BY MOUTH DAILY AS DIRECTED What changed:   how much to take  how to take this  when to take this   PROBIOTIC-10 PO Take 1 tablet by mouth daily.   senna-docusate 8.6-50 MG tablet Commonly known as: Senokot-S Take 1 tablet by mouth 2 (two) times daily.   TURMERIC PO Take 1 tablet by mouth daily.       Contact information for follow-up providers    [name], MD. Call.   Specialty: Family Medicine Contact information: [address] [address] [phone]        [place] Follow up.   Specialty: Radiology Why: For Drain injection on 11/17 Contact information: [place] [id number] [place]       Tommy Medal, Lavell Islam, MD. Call.   Specialty: Infectious Diseases Why: Follow up for Appointment in 3-4 Weeks Contact information: [address] [address] [phone]            Contact information for after-discharge care    Destination    [facility] Preferred SNF .   Service: Skilled Nursing Contact information: [place] [phone]                 Allergies  Allergen Reactions  . Flecainide Acetate Other (See Comments)    REACTION: SOB, swelling (hospitalized)   Consultations: -General Surgery -Gastroenterology -Interventional Radiology -Discussed case with Dr. Tommy Medal of ID  Procedures/Studies: Ct  Abdomen Pelvis Wo Contrast  Result Date: 08/29/2019 CLINICAL DATA:  Acute generalized abdominal pain. Status post cholecystectomy 08/22/2019. Patient not a pain medication. EXAM: CT ABDOMEN AND PELVIS WITHOUT CONTRAST TECHNIQUE: Multidetector CT imaging of the abdomen and pelvis was performed following the standard protocol without IV contrast. COMPARISON:  CT 08/10/2010, no interval CT FINDINGS: Lower chest: Small right and trace left pleural effusion. Bilateral lower lobe atelectasis. Compressive atelectasis in the right middle lobe related to elevated hemidiaphragm. Cardiomegaly. Hepatobiliary: Post cholecystectomy with fluid collection in the gallbladder fossa measuring 6.5 x 2.5 cm. Fluid measures simple density. Tiny dependent hyperdensities in the fluid collection, images 27 and 28 series 2 are nonspecific may represent drop stones. Small volume of simple perihepatic free fluid. Common bile duct is poorly defined, however measures approximately 10 mm. No visualized choledocholithiasis. Suggestion of slight nodular hepatic contours. No evidence of focal hepatic lesion. Pancreas: No ductal dilatation or inflammation. Spleen: Normal in size without focal abnormality. Adrenals/Urinary Tract: Normal adrenal glands. Extrarenal pelvis configuration of the right greater than left kidney. No renal stones. No perinephric edema. Urinary bladder is physiologically distended. No wall thickening. No bladder stone. Stomach/Bowel: Ingested contrast in the stomach. No gastric wall thickening. Administered enteric contrast reaches mid distal small bowel. No small bowel obstruction. Normal appendix. Moderate stool in the proximal colon. Small volume of stool in the more distal colon. Mild distal descending diverticulosis, prominent diverticulosis in the sigmoid. No evidence of diverticulitis. Vascular/Lymphatic: Mild aortic atherosclerosis. No aneurysm. Definite enlarged lymph nodes in the abdomen or pelvis. Reproductive:  Atrophic uterus. No gross adnexal mass. Other: Small volume simple free fluid in the pelvis. Minimal fluid in the right pericolic gutter. No free air. Expected stranding in the subcutaneous tissues port sites. No subcutaneous fluid collection. Musculoskeletal: There are no acute or suspicious osseous abnormalities. Scoliosis and degenerative change in the spine. IMPRESSION: 1. Post recent cholecystectomy with fluid collection in the gallbladder fossa measuring 6.5 x 2.5 cm, may be postoperative seroma or biloma. Tiny dependent hyperdensities within the fluid collection are nonspecific, may represent drop stones or potentially surgical packing. Consider nuclear medicine hepatic biliary scan if there is concern for biloma. 2. Prominent common bile duct at 10 mm, may be normal post cholecystectomy. 3. Additional free fluid about the liver and tracking into the pelvis measures simple fluid density. 4. Small right pleural effusion.  Bibasilar atelectasis. 5. Colonic diverticulosis without diverticulitis. Aortic Atherosclerosis (ICD10-I70.0). Electronically Signed   By: Keith Rake M.D.   On: 08/29/2019 02:36   Ct Abdomen Wo Contrast  Result Date: 09/10/2019 CLINICAL DATA:  Status post laparoscopic cholecystectomy on 08/22/2019, with gallbladder fossa fluid collection/biloma, status post strain on 10/23. EXAM: CT ABDOMEN WITHOUT CONTRAST TECHNIQUE: Multidetector CT imaging of the abdomen was performed following the standard protocol without IV contrast. COMPARISON:  08/29/2019 FINDINGS: Lower chest: Small bilateral pleural effusions. Associated lower lobe atelectasis. Hyperdense blood pool relative to myocardium, suggesting anemia. Hepatobiliary: Unenhanced liver is grossly unremarkable. Status post cholecystectomy. No intrahepatic or extrahepatic ductal dilatation. Indwelling common duct stent with associated pneumobilia. Percutaneous pigtail drain in the gallbladder fossa. No residual fluid collection/abscess.  Trace stranding with to foci of gas in the gallbladder fossa (series 2/images 28-29). Pancreas: Within normal limits. Spleen: Within normal limits. Adrenals/Urinary Tract: Adrenal glands are within normal limits. Kidneys are within normal limits. Bilateral extrarenal pelvis, right greater than left. Stomach/Bowel: Stomach and visualized bowel are unremarkable. Vascular/Lymphatic: No evidence of abdominal aortic aneurysm. Atherosclerotic calcifications of the abdominal aorta and branch vessels. No suspicious abdominal lymphadenopathy. Other: No abdominal ascites. Musculoskeletal:  Degenerative changes of the lower thoracic spine. IMPRESSION: Percutaneous pigtail drain in the gallbladder fossa. No residual fluid collection/abscess. Status post cholecystectomy. Indwelling common duct stent with pneumobilia. Small pleural effusions.  Associated lower atelectasis. Electronically Signed   By: Julian Hy M.D.   On: 09/10/2019 11:10   Ir Sinus/fist Tube Chk-non Gi  Result Date: 09/11/2019 CLINICAL DATA:  History of laparoscopic cholecystectomy complicated by development of bile leak post percutaneous drainage catheter placement into gallbladder fossa fluid collection on 08/30/2019 (Dr. Laurence Ferrari). Additionally, patient has undergone ERCP with internal biliary stent placement on 09/03/2019. Subsequent CT scan the abdomen pelvis performed earlier today demonstrates complete resolution the gallbladder fossa fluid collection and as such patient now presents for drainage catheter injection prior to potential removal. EXAM: FLUOROSCOPIC GUIDED DRAIN INJECTION COMPARISON:  CT abdomen pelvis-earlier same day; 08/29/2019; CT-guided percutaneous drainage catheter placement-08/30/2019; ERCP-09/03/2019 CONTRAST:  32mL OMNIPAQUE IOHEXOL 300 MG/ML SOLN-administered via the existing percutaneous drainage catheter FLUOROSCOPY TIME:  1 minutes, 18 seconds (69 mGy) TECHNIQUE: The patient was positioned supine on the fluoroscopy  table. A preprocedural spot fluoroscopic image was obtained of the right upper abdominal quadrant and existing percutaneous drainage catheter Multiple spot fluoroscopic and radiographic images were obtained following the injection of a small amount of contrast via the existing percutaneous drainage catheter. Images reviewed and the drainage catheter was flushed with a small amount of saline. The drainage catheter was converted from a JP bulb to a gravity bag. A dressing was applied. The patient tolerated the procedure well without immediate postprocedural complication. FINDINGS: Preprocedural spot fluoroscopic image demonstrates unchanged positioning percutaneous drainage catheter with end coiled and locked over the lying the expected location the gallbladder fossa. An internal plastic biliary stent overlies expected location of the CBD Contrast injection demonstrates opacification of the decompressed gallbladder fossa fluid collection with persistent communication to the biliary tree though to the expected location of the cystic duct remnant, rather to a division of the right biliary tree. There is passage of contrast through the CBD and biliary stent to the level of the duodenum. There is minimal opacification of the cystic duct remnant without definitive evidence of contrast extravasation from the cystic duct remnant. IMPRESSION: 1. Fluoroscopic guided drainage catheter injection demonstrates findings worrisome for bile leak from a division of the right biliary tree rather than the cystic duct remnant. 2. Widely patent and appropriately positioned internal biliary stent. Above findings discussed with Dr. Kieth Brightly on 09/11/2019. PLAN: - The patient drainage catheter was converted from a JP bulb to a gravity bag. - The patient was instructed to not flush the percutaneous drainage catheter however to maintain diligent records regarding drainage catheter output. - Repeat drain injection may be performed in 2 weeks  as deemed appropriate by Dr. Kieth Brightly. Electronically Signed   By: Sandi Mariscal M.D.   On: 09/11/2019 15:40   Dg Chest Port 1 View  Result Date: 09/15/2019 CLINICAL DATA:  Short of breath EXAM: PORTABLE CHEST 1 VIEW COMPARISON:  09/13/2019 FINDINGS: Unchanged AP portable examination with heterogeneous opacity at the left lung base, consistent with infection, atelectasis, and/or scarring. No new airspace opacity. Mild cardiomegaly. IMPRESSION: 1. Unchanged AP portable examination with heterogeneous opacity at the left lung base, consistent with infection, atelectasis, and/or scarring. No new airspace opacity. 2.  Mild cardiomegaly. Electronically Signed   By: Eddie Candle M.D.   On: 09/15/2019 13:58   Dg Chest Port 1 View  Result Date: 09/13/2019 CLINICAL DATA:  Shortness of breath. EXAM: PORTABLE CHEST 1  VIEW COMPARISON:  09/11/2019 FINDINGS: Cardiomediastinal contours remain enlarged. Signs of left lower lobe airspace disease as before. No dense consolidation. No acute bone finding. IMPRESSION: No change in the appearance of the chest since prior study. Electronically Signed   By: Zetta Bills M.D.   On: 09/13/2019 10:03   Dg Chest Port 1 View  Result Date: 09/11/2019 CLINICAL DATA:  Fever EXAM: PORTABLE CHEST 1 VIEW COMPARISON:  09/06/2019 FINDINGS: Cardiomegaly with vascular congestion. Left lower lobe atelectasis or infiltrate. No confluent opacity on the right. No effusions or acute bony abnormality. IMPRESSION: Cardiomegaly, vascular congestion. Left lower lobe atelectasis or infiltrate. Electronically Signed   By: Rolm Baptise M.D.   On: 09/11/2019 20:39   Dg Chest Port 1 View  Result Date: 09/06/2019 CLINICAL DATA:  Short of breath EXAM: PORTABLE CHEST 1 VIEW COMPARISON:  08/28/2019 FINDINGS: Small pleural effusions. Cardiomegaly with vascular congestion and diffuse interstitial and hazy pulmonary edema. No pneumothorax. IMPRESSION: Mild cardiomegaly with vascular congestion and mild  pulmonary edema. There are suspected small pleural effusions. Left basilar atelectasis or pneumonia Electronically Signed   By: [name] M.D.   On: 09/06/2019 17:41   Dg Chest Port 1 View  Result Date: 08/28/2019 CLINICAL DATA:  CHF, shortness of breath EXAM: PORTABLE CHEST 1 VIEW COMPARISON:  None. FINDINGS: There is mild cardiomegaly. Pulmonary vascular congestion is seen. There is mildly increased interstitial markings throughout both lungs. There is blunting of the left costophrenic angle which could be due to a trace left pleural effusion. No acute osseous abnormality. IMPRESSION: Mild cardiomegaly and interstitial edema. Probable trace left pleural effusion. Electronically Signed   By: Prudencio Pair M.D.   On: 08/28/2019 22:34   Dg Ercp  Result Date: 09/03/2019 CLINICAL DATA:  78 year old female with a history of sphincterotomy for choledocholithiasis EXAM: ERCP TECHNIQUE: Multiple spot images obtained with the fluoroscopic device and submitted for interpretation post-procedure. FLUOROSCOPY TIME:  Fluoroscopy Time: 4 minutes 29 seconds COMPARISON:  None. FINDINGS: Limited images during ERCP. Initial image demonstrates endoscope projecting over the upper abdomen with a safety wire in position in the extrahepatic biliary ducts and partial opacification with contrast. Subsequently there is evidence retrieval balloon deployment and placement of a plastic biliary stent. Ill-defined contrast between the common bile duct and the pigtail drainage catheter present. Pigtail drainage catheter in place in the upper abdomen. IMPRESSION: Limited images during ERCP demonstrates treatment of choledocholithiasis with deployment of a retrieval balloon and placement of a plastic biliary stent. Ill-defined contrast between the common bile duct and the pigtail drainage catheter may reflect leakage or refluxed contrast in a cystic duct stump. Please refer to the dictated operative report for full details of  intraoperative findings and procedure. Electronically Signed   By: Corrie Mckusick D.O.   On: 09/03/2019 13:11   Ct Image Guided Drainage By Percutaneous Catheter  Result Date: 08/30/2019 INDICATION: 78 year old female with post cholecystectomy bile leak. She presents for CT-guided drain placement. EXAM: CT-guided drain placement MEDICATIONS: The patient is currently admitted to the hospital and receiving intravenous antibiotics. The antibiotics were administered within an appropriate time frame prior to the initiation of the procedure. ANESTHESIA/SEDATION: Fentanyl 100 mcg IV; Versed 4 mg IV, 1 mg Dilaudid Moderate Sedation Time:  34 minutes The patient was continuously monitored during the procedure by the interventional radiology nurse under my direct supervision. COMPLICATIONS: None immediate. PROCEDURE: Informed written consent was obtained from the patient after a thorough discussion of the procedural risks, benefits and alternatives. All questions  were addressed. A timeout was performed prior to the initiation of the procedure. A planning axial CT scan was performed. The fluid collection in the gallbladder fossa was successfully identified. A suitable skin entry site was selected and marked. Local anesthesia was attained by infiltration with 1% lidocaine. A small dermatotomy was made. Under intermittent CT guidance, an 18 gauge trocar needle was advanced into the fluid collection. A 0.035 wire was then advanced into the fluid collection. The needle was removed. The soft tissue tract was dilated to 10 [place] and a [place] all-purpose drainage catheter was advanced over the wire and formed in the fluid collection. Aspiration yields several 100 mL of bilious fluid. The catheter was secured in place with 0 Prolene suture and connected to JP bulb suction. Follow-up CT imaging demonstrates a well-positioned drainage catheter with significantly decreased fluid in the gallbladder fossa. However, some of the  fluid has leaked into the subcutaneous soft tissues along the catheter tubing. IMPRESSION: Successful placement of 10 French drainage catheter into the gallbladder fossa with aspiration of several 100 mL bilious fluid. Electronically Signed   By: Jacqulynn Cadet M.D.   On: 08/30/2019 13:47   Nm Hepato Biliary Leak  Addendum Date: 08/29/2019   ADDENDUM REPORT: 08/29/2019 11:46 ADDENDUM: These results were called by telephone at the time of interpretation on 08/29/2019 at 11:46 am to provider Dr. Marlou Starks, who verbally acknowledged these results. Electronically Signed   By: Zetta Bills M.D.   On: 08/29/2019 11:46   Result Date: 08/29/2019 CLINICAL DATA:  History of suspected bile leak. EXAM: NUCLEAR MEDICINE HEPATOBILIARY IMAGING TECHNIQUE: Sequential images of the abdomen were obtained out to 60 minutes following intravenous administration of radiopharmaceutical. RADIOPHARMACEUTICALS:  5.1 mCi Tc-42m  Choletec IV COMPARISON:  CT study of 08/29/2019 FINDINGS: Prompt uptake of radiotracer into hepatic parenchyma with excretion into the biliary tree is noted. An amorphous collection of radiotracer is demonstrated, enlarging over time, over the right upper quadrant extending into the expected area of gallbladder fossa and porta hepatis. Likely tracking up over the liver as well. IMPRESSION: Nuclear medicine signs of biliary leak. No signs of small bowel activity. A call is out to the referring provider to discuss findings in the above case. Electronically Signed: By: Zetta Bills M.D. 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  Denies chest pain, lightheadedness or dizziness.  No nausea or vomiting.  Was not having any shortness of breath.  No other concerns or plans at this time and states her abdominal pain is improved and thinks that she is improving.  Feels well and stable to be discharged to a skilled nursing facility at this time.  Discharge Exam: Vitals:   09/16/19 2118 09/17/19 0524  BP: (!) 109/41 100/61  Pulse: 68 80  Resp: 18 16  Temp: 98.1 F (36.7 C) 98.2 F (36.8 C)  SpO2: 93% 94%   Vitals:   09/16/19 0547 09/16/19 1519 09/16/19 2118 09/17/19 0524  BP:  (!) 123/56 (!) 109/41 100/61  Pulse: 74 63 68 80  Resp: 19 18 18 16   Temp:  97.6 F (36.4 C) 97.8 F (36.6 C) 98.1 F (36.7 C) 98.2 F (36.8 C)  TempSrc: Oral Oral Oral Oral  SpO2: 96% 95% 93% 94%  Weight: 89.7 kg   87 kg  Height:       General: Pt is alert, awake, not in acute distress Cardiovascular: RRR, S1/S2 +, no rubs, no gallops Respiratory: Diminished bilaterally on the Left, no wheezing, no rhonchi; Unlabored breathing Abdominal: Soft, NT, Distended 2/2 body habitus, bowel sounds + Extremities: Trace edema, no cyanosis  The results of significant diagnostics from this hospitalization (including imaging, microbiology, ancillary and laboratory) are listed below for reference.    Microbiology: Recent Results (from the past 240 hour(s))  Fungus culture, blood     Status: None (Preliminary result)   Collection Time: 09/11/19 12:09 PM   Specimen: BLOOD  Result Value Ref Range Status   Specimen Description   Final    BLOOD LEFT ARM Performed at [facility] [address], [place], [place] [address]    Special Requests   Final    BOTTLES DRAWN AEROBIC ONLY Blood Culture adequate volume Performed at [facility] [address] [place], [place] [address]    Culture   Final    NO GROWTH 6  DAYS Performed at [facility], [place] [address], [place], [place] [address]    Report Status PENDING  Incomplete  Culture, blood (routine x 2)     Status: None   Collection Time: 09/11/19 12:09 PM   Specimen: BLOOD  Result Value Ref Range Status   Specimen Description   Final    BLOOD LEFT ARM Performed at [facility] [address], [place], [place] [address]    Special Requests   Final    BOTTLES DRAWN AEROBIC AND ANAEROBIC Blood Culture adequate volume Performed at [facility] [address] [place], [place] [address]    Culture   Final    NO GROWTH 5 DAYS Performed at [facility], [place] [address] [place], [place] [address]    Report Status 09/16/2019 FINAL  Final  Culture, blood (routine x 2)     Status: None   Collection Time: 09/11/19 12:15 PM   Specimen: BLOOD LEFT HAND  Result Value Ref Range Status   Specimen Description   Final    BLOOD LEFT HAND Performed at [facility] [address] [place], [place] [address]    Special Requests   Final    BOTTLES DRAWN AEROBIC AND ANAEROBIC Blood Culture adequate volume Performed at [facility] [address] [place], [place] [address]    Culture   Final    NO GROWTH 5 DAYS Performed at [facility], [place] [address] [place], [place] [address]    Report Status 09/16/2019 FINAL  Final  Culture, Urine     Status: Abnormal   Collection Time: 09/11/19 11:45 PM   Specimen: Urine, Random  Result Value Ref Range Status   Specimen Description   Final    URINE, RANDOM Performed at [facility] [address] [place], [place] [address]    Special Requests   Final    NONE Performed at [facility], [place] [address], [place], [place] [address]    Culture MULTIPLE SPECIES PRESENT, SUGGEST RECOLLECTION (A)  Final   Report Status 09/12/2019 FINAL  Final  SARS CORONAVIRUS 2 (TAT 6-24 HRS)  Nasopharyngeal Nasopharyngeal Swab  Status: None   Collection Time: 09/16/19 11:54 AM   Specimen: Nasopharyngeal Swab  Result Value Ref Range Status   SARS Coronavirus 2 NEGATIVE NEGATIVE Final    Comment: (NOTE) SARS-CoV-2 target nucleic acids are NOT DETECTED. The SARS-CoV-2 RNA is generally detectable in upper and lower respiratory specimens during the acute phase of infection. Negative results do not preclude SARS-CoV-2 infection, do not rule out co-infections with other pathogens, and should not be used as the sole basis for treatment or other patient management decisions. Negative results must be combined with clinical observations, patient history, and epidemiological information. The expected result is Negative. Fact Sheet for Patients: SugarRoll.be Fact Sheet for Healthcare Providers: https://www.woods-mathews.com/ This test is not yet approved or cleared by the [place] FDA and  has been authorized for detection and/or diagnosis of SARS-CoV-2 by FDA under an Emergency Use Authorization (EUA). This EUA will remain  in effect (meaning this test can be used) for the duration of the COVID-19 declaration under Section 56 4(b)(1) of the Act, 21 U.S.C. section 360bbb-3(b)(1), unless the authorization is terminated or revoked sooner. Performed at [facility], [place] [address], [place], [place] [address]     Labs: BNP (last 3 results) Recent Labs    08/28/19 2158  BNP 409.7*   Basic Metabolic Panel: Recent Labs  Lab 09/12/19 0629 09/13/19 0538 09/14/19 0548 09/15/19 0924 09/16/19 0539  NA 137 135 136 136 136  K 3.7 3.9 3.8 3.6 3.7  CL 96* 95* 98 99 100  CO2 31 29 26 24 25   GLUCOSE 103* 101* 105* 104* 94  BUN 19 21 21 18 21   CREATININE 0.95 0.94 0.93 1.03* 0.90  CALCIUM 8.9 8.8* 8.9 9.2 9.0  MG 2.2 2.1 2.2 2.1 2.3  PHOS 3.5 4.0 4.0 4.0 4.3   Liver Function Tests: Recent Labs  Lab 09/12/19 0629  09/13/19 0538 09/14/19 0548 09/15/19 0924 09/16/19 0539  AST 21 20 22 23 22   ALT 19 15 17 8 16   ALKPHOS 92 90 93 96 91  BILITOT 0.7 1.0 0.5 0.6 0.6  PROT 6.9 6.5 6.6 7.3 7.0  ALBUMIN 2.6* 2.6* 2.5* 2.7* 2.7*   No results for input(s): LIPASE, AMYLASE in the last 168 hours. No results for input(s): AMMONIA in the last 168 hours. CBC: Recent Labs  Lab 09/12/19 0629 09/13/19 0538 09/14/19 0548 09/15/19 0924 09/16/19 0539  WBC 15.2* 10.3 7.3 7.7 7.0  NEUTROABS 10.3* 5.8 3.8 4.3 2.4  HGB 10.2* 9.4* 9.8* 10.7* 10.4*  HCT 31.9* 30.1* 30.7* 33.3* 32.4*  MCV 97.9 96.8 95.6 95.4 96.1  PLT 658* 640* 675* 688* 617*   Cardiac Enzymes: No results for input(s): CKTOTAL, CKMB, CKMBINDEX, TROPONINI in the last 168 hours. BNP: Invalid input(s): POCBNP CBG: No results for input(s): GLUCAP in the last 168 hours. D-Dimer No results for input(s): DDIMER in the last 72 hours. Hgb A1c No results for input(s): HGBA1C in the last 72 hours. Lipid Profile No results for input(s): CHOL, HDL, LDLCALC, TRIG, CHOLHDL, LDLDIRECT in the last 72 hours. Thyroid function studies No results for input(s): TSH, T4TOTAL, T3FREE, THYROIDAB in the last 72 hours.  Invalid input(s): FREET3 Anemia work up No results for input(s): VITAMINB12, FOLATE, FERRITIN, TIBC, IRON, RETICCTPCT in the last 72 hours. Urinalysis    Component Value Date/Time   COLORURINE YELLOW 09/11/2019 2345   APPEARANCEUR HAZY (A) 09/11/2019 2345   LABSPEC 1.015 09/11/2019 2345   PHURINE 7.0 09/11/2019 2345   GLUCOSEU NEGATIVE 09/11/2019 2345  HGBUR LARGE (A) 09/11/2019 2345   HGBUR negative 01/31/2008 1402   BILIRUBINUR NEGATIVE 09/11/2019 2345   BILIRUBINUR negative 03/25/2013 1700   KETONESUR NEGATIVE 09/11/2019 2345   PROTEINUR NEGATIVE 09/11/2019 2345   UROBILINOGEN 0.2 03/25/2013 1700   UROBILINOGEN 0.2 07/13/2010 1725   NITRITE NEGATIVE 09/11/2019 2345   LEUKOCYTESUR NEGATIVE 09/11/2019 2345   Sepsis Labs Invalid  input(s): PROCALCITONIN,  WBC,  LACTICIDVEN Microbiology Recent Results (from the past 240 hour(s))  Fungus culture, blood     Status: None (Preliminary result)   Collection Time: 09/11/19 12:09 PM   Specimen: BLOOD  Result Value Ref Range Status   Specimen Description   Final    BLOOD LEFT ARM Performed at [facility], [place] [address], [place], [place] [address]    Special Requests   Final    BOTTLES DRAWN AEROBIC ONLY Blood Culture adequate volume Performed at [facility] [address], [place], [place] [address]    Culture   Final    NO GROWTH 6 DAYS Performed at [facility], [place] [address] [place], [place] [address]    Report Status PENDING  Incomplete  Culture, blood (routine x 2)     Status: None   Collection Time: 09/11/19 12:09 PM   Specimen: BLOOD  Result Value Ref Range Status   Specimen Description   Final    BLOOD LEFT ARM Performed at [facility] [address], [place], [place] [address]    Special Requests   Final    BOTTLES DRAWN AEROBIC AND ANAEROBIC Blood Culture adequate volume Performed at [facility] [address], [place], [place] [address]    Culture   Final    NO GROWTH 5 DAYS Performed at [facility], [place] [address] [place], [place] [address]    Report Status 09/16/2019 FINAL  Final  Culture, blood (routine x 2)     Status: None   Collection Time: 09/11/19 12:15 PM   Specimen: BLOOD LEFT HAND  Result Value Ref Range Status   Specimen Description   Final    BLOOD LEFT HAND Performed at [facility] [address] [place], [place] [address]    Special Requests   Final    BOTTLES DRAWN AEROBIC AND ANAEROBIC Blood Culture adequate volume Performed at [facility] [address], [place], [place] [address]    Culture   Final    NO GROWTH 5 DAYS Performed at [facility], [place] [address],  [place], [place] [address]    Report Status 09/16/2019 FINAL  Final  Culture, Urine     Status: Abnormal   Collection Time: 09/11/19 11:45 PM   Specimen: Urine, Random  Result Value Ref Range Status   Specimen Description   Final    URINE, RANDOM Performed at [facility] [address], [place], [place] [address]    Special Requests   Final    NONE Performed at [facility], [place] [address] [place], [place] [address]    Culture MULTIPLE SPECIES PRESENT, SUGGEST RECOLLECTION (A)  Final   Report Status 09/12/2019 FINAL  Final  SARS CORONAVIRUS 2 (TAT 6-24 HRS) Nasopharyngeal Nasopharyngeal Swab     Status: None   Collection Time: 09/16/19 11:54 AM   Specimen: Nasopharyngeal Swab  Result Value Ref Range Status   SARS Coronavirus 2 NEGATIVE NEGATIVE Final    Comment: (NOTE) SARS-CoV-2 target nucleic acids  are NOT DETECTED. The SARS-CoV-2 RNA is generally detectable in upper and lower respiratory specimens during the acute phase of infection. Negative results do not preclude SARS-CoV-2 infection, do not rule out co-infections with other pathogens, and should not be used as the sole basis for treatment or other patient management decisions. Negative results must be combined with clinical observations, patient history, and epidemiological information. The expected result is Negative. Fact Sheet for Patients: SugarRoll.be Fact Sheet for Healthcare Providers: https://www.woods-mathews.com/ This test is not yet approved or cleared by the [place] FDA and  has been authorized for detection and/or diagnosis of SARS-CoV-2 by FDA under an Emergency Use Authorization (EUA). This EUA will remain  in effect (meaning this test can be used) for the duration of the COVID-19 declaration under Section 56 4(b)(1) of the Act, 21 U.S.C. section 360bbb-3(b)(1), unless the authorization is terminated or revoked  sooner. Performed at [facility], [place] [address], [place], [place] [address]    Time coordinating discharge: 35 minutes  SIGNED:  Kerney Elbe, DO [facility] 09/17/2019, 12:22 PM Pager is on [place]  If 7PM-7AM, please contact night-coverage [email or web address] Password [email or web address]

## 2019-09-17 NOTE — TOC Transition Note (Signed)
Transition of Care [facility]) - CM/SW Discharge Note   Patient Details  Name: Kristen [name] MRN: [id number] Date of Birth: Nov 30, 1940  [facility] CM/SW Contact:  Trish Mage, LCSW Phone Number: 09/17/2019, 2:47 PM   Clinical Narrative:   Patient to d/c today.  All necessary reports forwarded to [facility].  PTAR called. Nursing, please call report to [phone].  Room 310.  TOC sign off.    Final next level of care: Skilled Nursing Facility Barriers to Discharge: No Barriers Identified   Patient Goals and CMS Choice Patient states their goals for this hospitalization and ongoing recovery are:: be doing good when I get back home to dancing and my cats      Discharge Placement PASRR number recieved: 09/11/19([phone])            Patient chooses bed at: [place], [place] Patient to be transferred to facility by: [place] Name of family member notified: No one    Discharge Plan and Services In-house Referral: Clinical Social Work                                   Social Determinants of Health ([place]) Interventions     Readmission Risk Interventions Readmission Risk Prevention Plan 09/17/2019  Transportation Screening Complete  PCP or Specialist Appt within 5-7 Days Complete  Home Care Screening Complete  Medication Review (RN CM) Complete  Some recent data might be hidden

## 2019-09-17 NOTE — Progress Notes (Signed)
Pt being discharged to SNF ([facility]) today via PTAR. RN phoned report to Claiborne Billings, Therapist, sports at [facility]. No further questions at this time. Discharged instructions including follow-ups and medications provided to PTAR to provide to receiving facility.

## 2019-09-17 NOTE — TOC Transition Note (Signed)
Transition of Care [facility]) - CM/SW Discharge Note   Patient Details  Name: Kristen [name] MRN: [id number] Date of Birth: [birth year]  Transition of Care [facility]) CM/SW Contact:  Trish Mage, LCSW Phone Number: 09/17/2019, 11:32 AM   Clinical Narrative:   [name] has been approved for SNF stay.  Authorization #[id number], Ref.# [id number].  This is for three days , beginning tomorrow, ending Friday.  [name] is the reviewer and notes can be faxed to her at [phone].  Latest COVID test is negative.  Pt can transfer when medically stable.  If not by Friday, will require reauthorization, but contact [place] first to make sure in case patient is scheduled for d/c over weekend. TOC will continue to follow during the course of hospitalization.     Final next level of care: Skilled Nursing Facility Barriers to Discharge: No Barriers Identified   Patient Goals and CMS Choice Patient states their goals for this hospitalization and ongoing recovery are:: be doing good when I get back home to dancing and my cats      Discharge Placement PASRR number recieved: 09/11/[phone])            Patient chooses bed at: [place], [place] Patient to be transferred to facility by: [place] Name of family member notified: No one    Discharge Plan and Services In-house Referral: Clinical Social Work                                   Social Determinants of Health (SDOH) Interventions     Readmission Risk Interventions No flowsheet data found.

## 2019-09-18 ENCOUNTER — Other Ambulatory Visit: Payer: Self-pay | Admitting: Cardiology

## 2019-09-18 LAB — FUNGUS CULTURE, BLOOD
Culture: NO GROWTH
Special Requests: ADEQUATE

## 2019-09-24 ENCOUNTER — Other Ambulatory Visit: Payer: Self-pay

## 2019-09-24 ENCOUNTER — Ambulatory Visit (HOSPITAL_COMMUNITY)
Admission: RE | Admit: 2019-09-24 | Discharge: 2019-09-24 | Disposition: A | Discharge status: Home / Self Care | Payer: Medicare Other | Source: Ambulatory Visit | Attending: Radiology | Admitting: Radiology

## 2019-09-24 ENCOUNTER — Encounter (HOSPITAL_COMMUNITY): Payer: Self-pay | Admitting: Interventional Radiology

## 2019-09-24 ENCOUNTER — Other Ambulatory Visit (HOSPITAL_COMMUNITY): Payer: Self-pay | Admitting: Radiology

## 2019-09-24 DIAGNOSIS — R188 Other ascites: Secondary | ICD-10-CM | POA: Insufficient documentation

## 2019-09-24 DIAGNOSIS — Z4803 Encounter for change or removal of drains: Secondary | ICD-10-CM | POA: Diagnosis not present

## 2019-09-24 DIAGNOSIS — T8143XA Infection following a procedure, organ and space surgical site, initial encounter: Secondary | ICD-10-CM | POA: Diagnosis not present

## 2019-09-24 HISTORY — PX: IR SINUS/FIST TUBE CHK-NON GI: IMG673

## 2019-09-24 MED ORDER — IOHEXOL 300 MG/ML  SOLN
50.0000 mL | Freq: Once | INTRAMUSCULAR | Status: AC | PRN
  Administered 2019-09-24: 5 mL

## 2019-09-24 NOTE — Procedures (Signed)
Pre procedural Dx: Post chole bile leak Post procedural Dx: Same  Drainage catheter injection demonstrates opacification of the decompressed gallbladder fossa fluid collection without evidence of residual bile leak.  Given resolution of the bile leak on today's injection as well as lack of any output from the drainage catheter for the past several days, the drainage catheter was removed at the patient's bedside without incident.  EBL: Minimal Complications: None immediate  PLAN:  The patient is to maintain all previously scheduled follow-up appointments with referring surgeon, Dr. Kieth Brightly, as well as referring gastroenterologist, Dr. Watt Climes.   Ronny Bacon, MD Pager #: [phone]

## 2019-09-25 ENCOUNTER — Telehealth: Payer: Self-pay | Admitting: Cardiology

## 2019-09-25 ENCOUNTER — Other Ambulatory Visit: Payer: Self-pay

## 2019-09-25 DIAGNOSIS — R197 Diarrhea, unspecified: Secondary | ICD-10-CM | POA: Diagnosis not present

## 2019-09-25 DIAGNOSIS — I5033 Acute on chronic diastolic (congestive) heart failure: Secondary | ICD-10-CM | POA: Diagnosis not present

## 2019-09-25 DIAGNOSIS — N179 Acute kidney failure, unspecified: Secondary | ICD-10-CM | POA: Diagnosis not present

## 2019-09-25 DIAGNOSIS — I4891 Unspecified atrial fibrillation: Secondary | ICD-10-CM | POA: Diagnosis not present

## 2019-09-25 DIAGNOSIS — K9189 Other postprocedural complications and disorders of digestive system: Secondary | ICD-10-CM | POA: Diagnosis not present

## 2019-09-25 MED ORDER — LISINOPRIL-HYDROCHLOROTHIAZIDE 20-25 MG PO TABS: 0.5000 | ORAL_TABLET | Freq: Every day | ORAL | 1 refills | Status: DC

## 2019-10-02 ENCOUNTER — Ambulatory Visit (INDEPENDENT_AMBULATORY_CARE_PROVIDER_SITE_OTHER): Payer: Medicare Other | Admitting: Infectious Disease

## 2019-10-02 ENCOUNTER — Encounter: Payer: Self-pay | Admitting: Infectious Disease

## 2019-10-02 ENCOUNTER — Other Ambulatory Visit: Payer: Self-pay

## 2019-10-02 VITALS — BP 112/76 | HR 84

## 2019-10-02 DIAGNOSIS — K651 Peritoneal abscess: Secondary | ICD-10-CM

## 2019-10-02 DIAGNOSIS — R188 Other ascites: Secondary | ICD-10-CM | POA: Diagnosis not present

## 2019-10-02 DIAGNOSIS — Z9049 Acquired absence of other specified parts of digestive tract: Secondary | ICD-10-CM

## 2019-10-02 HISTORY — DX: Acquired absence of other specified parts of digestive tract: Z90.49

## 2019-10-02 HISTORY — DX: Peritoneal abscess: K65.1

## 2019-10-02 NOTE — Progress Notes (Signed)
Subjective:    Patient ID: Kristen [name], female    DOB: July 31, 1941, 78 y.o.   MRN: [id number]  Reason for consult: Infected biloma  Requesting MD: Dr. Shelton Silvas  HPI  78 year old female with a history of anxiety, atrial fibrillation on anticoagulation with Eliquis, hypertension, CKD uncertain baseline who came to ED with abdominal pain. Patient recently underwent laparoscopic cholecystectomy on 08/22/2019 and was recovering uneventfully back at home but started developing cervical pain of right upper quadrant with radiation towards right shoulder blade in the ED CT abdomen pelvis revealed fluid collection in the gallbladder fossa. General surgery was consulted HIDA scan was done which confirmed biliary leak and no activity in the small bowel. Patient underwent gallbladder fossa drain placement per IR and ERCP with biliary duct stone removal and stent placement per GI.  Note when her bile was aspirated by interventional radiology they described it as bilious and not purulent.  At that time in 23rd no cultures were sent.  During her stay she developed a fever and some leukocytosis and she was placed on intravenous Zosyn.  A culture done from the bile grew Candida  Cillosis.  I do not see an interventional radiology procedure having been done and I suspect this is material was collected from her drain meaning that this could have reflected colonization of the drain rather than showing that candidate was a true pathogen.  In any case the patient was sent  A RN home  on oral fluconazole and Augmentin  She has been seen by interventional radiology in follow-up and the drain had ceased draining any bilious material they performed an injection with dye into the area and this developed showed a fossa that was devoid of any fluid.  Drain was therefore removed.  Since drain removal she has not had any increased pain or fevers or other systemic symptoms to suggest infection.  I am not entirely  convinced that her bile was infected in the first place based on the appearance that was described by radiology when they placed the drain and I believe we may have been chasing a culture result in the form of the Candida that likely represented colonization of her drain.  I feel it is fine for her to complete her antibiotics and stop and be observed off antibiotics  Past Medical History:  Diagnosis Date  . Abscess of abdominal cavity ([place]) 10/02/2019  . Anticoagulant long-term use    elquis -- managed by cardiology  . Anxiety   . Chronic calculous cholecystitis   . Chronic venous insufficiency    w/  varicose veins  . Depression   . Diastolic CHF, chronic ([place])    followed by cardiology  . Diverticulosis of colon   . Edema of both lower extremities    per pt mostly in summer time  . Essential hypertension, benign   . Fibrocystic breast disease   . Full dentures   . GERD (gastroesophageal reflux disease)    occasional tums and does not eat prior to bedtime  . Gout    08-19-2019  per pt last episode 07/ 2020  . Hiatal hernia   . History of diverticulitis 01/22/2016  . History of recurrent UTIs   . Hx of cholecystectomy 10/02/2019  . OA (osteoarthritis)    knees, lower back  . Permanent atrial fibrillation [facility]) cardiologist--- dr Agustin Cree   first dx 10/ 2011--- histroy DCCV 11-25-2010 by dr Wynonia Lawman (pt's previous cardiologist) and Cardiac cath 12-17-2010 no significant disease  .  Scoliosis   . Stress incontinence in female     Past Surgical History:  Procedure Laterality Date  . BILIARY STENT PLACEMENT N/A 09/03/2019   Procedure: BILIARY STENT PLACEMENT;  Surgeon: Clarene Essex, MD;  Location: [facility];  Service: Endoscopy;  Laterality: N/A;  . BLEPHAROPLASTY Bilateral 02-22-2010  dr Georgia Lopes   upper eyelid's  . BUNIONECTOMY Right 2003  . CATARACT EXTRACTION W/ INTRAOCULAR LENS  IMPLANT, BILATERAL  2018  . CHOLECYSTECTOMY N/A 08/22/2019   Procedure: LAPAROSCOPIC  CHOLECYSTECTOMY;  Surgeon: [name], [name], MD;  Location: [facility];  Service: General;  Laterality: N/A;  . ERCP N/A 09/03/2019   Procedure: ENDOSCOPIC RETROGRADE CHOLANGIOPANCREATOGRAPHY (ERCP);  Surgeon: Clarene Essex, MD;  Location: [facility];  Service: Endoscopy;  Laterality: N/A;  . IR SINUS/FIST TUBE CHK-NON GI  09/10/2019  . IR SINUS/FIST TUBE CHK-NON GI  09/24/2019  . KNEE ARTHROSCOPY Left 2002  . REMOVAL OF STONES  09/03/2019   Procedure: REMOVAL OF STONES;  Surgeon: Clarene Essex, MD;  Location: [facility];  Service: Endoscopy;;  . Joan Mayans  09/03/2019   Procedure: Joan Mayans;  Surgeon: Clarene Essex, MD;  Location: [facility];  Service: Endoscopy;;  . TUBAL LIGATION Bilateral 1980   AND RIGHT BREAST EXCISION CYST (BENIGN)    Family History  Problem Relation Age of Onset  . Cancer Mother        ovarian  . Kidney disease Father   . Alcohol abuse Father   . Cancer Maternal Aunt        lung- smoker  . Heart disease Maternal Uncle       Social History   Socioeconomic History  . Marital status: Widowed    Spouse name: [name]  . Number of children: 1  . Years of education: Masters  . Highest education level: Master's degree (e.g., MA, MS, MEng, MEd, MSW, MBA)  Occupational History  . Occupation: Retired- Product manager: RETIRED  Social Needs  . Financial resource strain: Not hard at all  . Food insecurity    Worry: Never true    Inability: Never true  . Transportation needs    Medical: No    Non-medical: No  Tobacco Use  . Smoking status: Never Smoker  . Smokeless tobacco: Never Used  Substance and Sexual Activity  . Alcohol use: Yes    Comment: rarely  . Drug use: Never  . Sexual activity: Not Currently    Birth control/protection: Post-menopausal  Lifestyle  . Physical activity    Days per week: 0 days    Minutes per session: 0 min  . Stress: Not at all  Relationships  . Social Herbalist on phone:  Three times a week    Gets together: Never    Attends religious service: Never    Active member of club or organization: Yes    Attends meetings of clubs or organizations: 1 to 4 times per year    Relationship status: Widowed  Other Topics Concern  . Not on file  Social History Narrative   Emergency Contact: Leta Baptist [phone]   Who lives with you: self   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 Exercise: Pt dances regularly for shows   Seatbelts: Pt reports wearing seatbelt when in vehicles.    Sun Exposure/Protection: Pt reports not being in the sun very much   Hobbies: dancing,  painting,       Working smoke alarm: yes      Home throw rugs: yes         Home free from clutter: yes                Allergies  Allergen Reactions  . Flecainide Acetate Other (See Comments)    REACTION: SOB, swelling (hospitalized)     Current Outpatient Medications:  .  alendronate (FOSAMAX) 10 MG tablet, TAKE (1) TABLET BY MOUTH PER WEEK TAKE ON EMPTY STOMACH WITH FULL GLASS OF WATER, Disp: 4 tablet, Rfl: 10 .  allopurinol (ZYLOPRIM) 100 MG tablet, TAKE 1 TABLET BY MOUTH EVERY DAY (Patient taking differently: Take 100 mg by mouth daily. ), Disp: 90 tablet, Rfl: 1 .  amoxicillin-clavulanate (AUGMENTIN) 875-125 MG tablet, Take 1 tablet by mouth every 12 (twelve) hours., Disp: 60 tablet, Rfl: 0 .  calcium carbonate (OSCAL) 1500 (600 Ca) MG TABS tablet, Take 1,500 mg by mouth daily. , Disp: , Rfl:  .  calcium carbonate (TUMS - DOSED IN MG ELEMENTAL CALCIUM) 500 MG chewable tablet, Chew 1 tablet by mouth as needed for indigestion or heartburn., Disp: , Rfl:  .  cholecalciferol (VITAMIN D3) 25 MCG (1000 UT) tablet, Take 1,000 Units by mouth daily., Disp: , Rfl:  .  CINNAMON PO, Take 1 tablet by mouth daily. , Disp: , Rfl:  .  clindamycin (CLINDAGEL) 1 % gel, APPLY TO AFFECTED AREA TWICE A DAY (Patient taking  differently: Apply 1 application topically 2 (two) times daily. ), Disp: 30 g, Rfl: 0 .  colchicine 0.6 MG tablet, Take 1 tablet (0.6 mg total) by mouth 2 (two) times daily. Take twice daily until pain resolves and then take as needed for gout flare. (Patient taking differently: Take 0.6 mg by mouth 2 (two) times daily as needed. Take twice daily until pain resolves and then take as needed for gout flare.), Disp: 60 tablet, Rfl: 1 .  [employer] ANTI-FUNGAL 2 % powder, APPLY TO AFFECTED AREA TWICE A DAY AFTER SKIN RESOLVES FROM KETOCONAZOLE CREAM (Patient taking differently: as needed. ), Disp: 85 g, Rfl: 1 .  diazepam (VALIUM) 5 MG tablet, TAKE 1/2 TABLET (2.5 MG TOTAL) BY MOUTH AT BEDTIME., Disp: 10 tablet, Rfl: 0 .  ELIQUIS 5 MG TABS tablet, TAKE 1 TABLET BY MOUTH TWICE A DAY (Patient taking differently: Take 5 mg by mouth 2 (two) times daily. ), Disp: 180 tablet, Rfl: 0 .  Flax Oil-Fish Oil-Borage Oil (FISH-FLAX-BORAGE) CAPS, Take 1 capsule by mouth daily., Disp: , Rfl:  .  fluconazole (DIFLUCAN) 200 MG tablet, Take 1 tablet (200 mg total) by mouth daily., Disp: 30 tablet, Rfl: 0 .  furosemide (LASIX) 20 MG tablet, Take 1 tablet (20 mg total) by mouth daily. (Patient taking differently: Take 20 mg by mouth daily. ), Disp: 90 tablet, Rfl: 1 .  glucosamine-chondroitin (GLUCOSAMINE-CHONDROITIN DS) 500-400 MG tablet, Take 1 tablet by mouth daily. , Disp: , Rfl:  .  HYDROcodone-acetaminophen (NORCO/VICODIN) 5-325 MG tablet, Take 1 tablet by mouth every 6 (six) hours as needed for moderate pain., Disp: 10 tablet, Rfl: 0 .  ipratropium-albuterol (DUONEB) 0.5-2.5 (3) MG/3ML SOLN, Take 3 mLs by nebulization every 6 (six) hours as needed., Disp: 360 mL, Rfl:  .  lisinopril-hydrochlorothiazide (ZESTORETIC) 20-25 MG tablet, Take 0.5 tablets by mouth daily., Disp: 90 tablet, Rfl: 1 .  loratadine (CLARITIN) 10 MG tablet, Take 1 tablet (10 mg total) by mouth daily., Disp: 90 tablet, Rfl: 1 .  Multiple Vitamins-Minerals  (MULTIVITAMIN WOMEN 50+) TABS, Take 1 tablet by mouth daily. , Disp: , Rfl:  .  mupirocin ointment (BACTROBAN) 2 %, APPLY TOPICALLY TO AFFECTED AREA TWICE DAILY (Patient taking differently: as needed. ), Disp: 22 g, Rfl: 10 .  Nutritional Supplements (ECHINACEA/GOLDEN SEAL PO), Take 1 tablet by mouth daily. , Disp: , Rfl:  .  pantoprazole (PROTONIX) 40 MG tablet, Take 1 tablet (40 mg total) by mouth daily., Disp:  , Rfl:  .  polycarbophil (FIBERCON) 625 MG tablet, Take 625 mg by mouth daily., Disp: , Rfl:  .  polyethylene glycol (MIRALAX / GLYCOLAX) 17 g packet, Take 17 g by mouth 2 (two) times daily., Disp: 14 each, Rfl: 0 .  potassium chloride (KLOR-CON) 20 MEQ packet, MIX 1 PACKET IN LIQUID AND TAKE BY MOUTH DAILY AS DIRECTED (Patient taking differently: Take 20 mEq by mouth daily. MIX 1 PACKET IN LIQUID AND TAKE BY MOUTH DAILY AS DIRECTED), Disp: 90 tablet, Rfl: 10 .  Probiotic Product (PROBIOTIC-10 PO), Take 1 tablet by mouth daily. , Disp: , Rfl:  .  senna-docusate (SENOKOT-S) 8.6-50 MG tablet, Take 1 tablet by mouth 2 (two) times daily., Disp:  , Rfl:  .  TURMERIC PO, Take 1 tablet by mouth daily. , Disp: , Rfl:  .  metoprolol succinate (TOPROL-XL) 100 MG 24 hr tablet, Take 1 tablet (100 mg total) by mouth daily. Take with or immediately following a meal. (Patient taking differently: Take 100 mg by mouth daily. Take with or immediately following a meal.), Disp: 90 tablet, Rfl: 1   Review of Systems  Constitutional: Negative for chills and fever.  HENT: Negative for congestion and sore throat.   Eyes: Negative for photophobia.  Respiratory: Negative for cough, shortness of breath and wheezing.   Cardiovascular: Negative for chest pain, palpitations and leg swelling.  Gastrointestinal: Positive for abdominal pain. Negative for blood in stool, constipation, diarrhea, nausea and vomiting.  Genitourinary: Negative for dysuria, flank pain and hematuria.  Musculoskeletal: Negative for back pain  and myalgias.  Skin: Negative for rash.  Neurological: Negative for dizziness, weakness and headaches.  Hematological: Does not bruise/bleed easily.  Psychiatric/Behavioral: Negative for agitation, behavioral problems, confusion, decreased concentration, dysphoric mood, hallucinations and suicidal ideas. The patient is not nervous/anxious and is not hyperactive.        Objective:   Physical Exam Constitutional:      General: She is not in acute distress.    Appearance: Normal appearance. She is well-developed. She is not ill-appearing or diaphoretic.  HENT:     Head: Normocephalic and atraumatic.     Right Ear: Hearing and external ear normal.     Left Ear: Hearing and external ear normal.     Nose: No nasal deformity or rhinorrhea.  Eyes:     General: No scleral icterus.    Conjunctiva/sclera: Conjunctivae normal.     Right eye: Right conjunctiva is not injected.     Left eye: Left conjunctiva is not injected.  Neck:     Musculoskeletal: Normal range of motion and neck supple.     Vascular: No JVD.  Cardiovascular:     Rate and Rhythm: Normal rate and regular rhythm.     Heart sounds: Normal heart sounds, S1 normal and S2 normal. No murmur. No friction rub.  Pulmonary:     Effort: Pulmonary effort is normal. No respiratory distress.     Breath sounds: Normal breath sounds. No wheezing.  Abdominal:     General:  Bowel sounds are normal. There is no distension.     Palpations: Abdomen is soft.     Tenderness: There is abdominal tenderness. There is no guarding.  Musculoskeletal: Normal range of motion.     Right shoulder: Normal.     Left shoulder: Normal.     Right hip: Normal.     Left hip: Normal.     Right knee: Normal.     Left knee: Normal.  Lymphadenopathy:     Head:     Right side of head: No submandibular, preauricular or posterior auricular adenopathy.     Left side of head: No submandibular, preauricular or posterior auricular adenopathy.     Cervical: No  cervical adenopathy.     Right cervical: No superficial or deep cervical adenopathy.    Left cervical: No superficial or deep cervical adenopathy.  Skin:    General: Skin is warm and dry.     Coloration: Skin is not pale.     Findings: No abrasion, bruising, ecchymosis, erythema, lesion or rash.     Nails: There is no clubbing.   Neurological:     Mental Status: She is alert and oriented to person, place, and time.     Sensory: No sensory deficit.     Coordination: Coordination normal.     Gait: Gait normal.  Psychiatric:        Attention and Perception: She is attentive.        Mood and Affect: Mood is anxious.        Speech: Speech normal.        Behavior: Behavior normal. Behavior is cooperative.        Thought Content: Thought content normal.        Cognition and Memory: Cognition normal.        Judgment: Judgment normal.           Assessment & Plan:   78 year old female with a history of anxiety, atrial fibrillation on anticoagulation with Eliquis, hypertension, CKD uncertain baseline who came to ED with abdominal pain. Patient recently underwent laparoscopic cholecystectomy on 08/22/2019 and was recovering uneventfully back at home but started developing cervical pain of right upper quadrant with radiation towards right shoulder blade in the ED CT abdomen pelvis revealed fluid collection in the gallbladder fossa. General surgery was consulted HIDA scan was done which confirmed biliary leak and no activity in the small bowel. Patient underwent gallbladder fossa drain placement per IR and ERCP with biliary duct stone removal and stent placement per GI  She had fever and elevated white count during her hospitalization was treated with IV Zosyn.  It appears that her bile from the drain was cultured and this yielded Candida parapsilosis.  She was treated with Augmentin and fluconazole.  It is not clear to me at all that she has infection of her biliary tree.  I think we may be  reacting too much to a positive culture.  I am okay with her however finishing her fluconazole and then she should be observed off antimicrobials.  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  This makes absolutely no sense to me whatsoever.  I would like to have her stop the doxycycline.  She can return to clinic as needed   GI.

## 2019-10-10 ENCOUNTER — Other Ambulatory Visit: Payer: Self-pay | Admitting: Family Medicine

## 2019-10-10 DIAGNOSIS — F411 Generalized anxiety disorder: Secondary | ICD-10-CM

## 2019-10-10 DIAGNOSIS — R2681 Unsteadiness on feet: Secondary | ICD-10-CM | POA: Diagnosis not present

## 2019-10-16 ENCOUNTER — Other Ambulatory Visit: Payer: Self-pay

## 2019-10-16 ENCOUNTER — Ambulatory Visit (INDEPENDENT_AMBULATORY_CARE_PROVIDER_SITE_OTHER): Payer: Medicare Other | Admitting: Student in an Organized Health Care Education/Training Program

## 2019-10-16 VITALS — BP 110/80

## 2019-10-16 DIAGNOSIS — Z591 Inadequate housing: Secondary | ICD-10-CM

## 2019-10-16 DIAGNOSIS — R69 Illness, unspecified: Secondary | ICD-10-CM | POA: Diagnosis not present

## 2019-10-16 DIAGNOSIS — R2681 Unsteadiness on feet: Secondary | ICD-10-CM

## 2019-10-16 NOTE — Progress Notes (Signed)
   Subjective:    Patient ID: Kristen [name], female    DOB: 02-05-1941, 78 y.o.   MRN: [id number]  CC: rehab follow up?  HPI:  Patient has no specific complaints today. She states this is a mandatory follow up appointment from discharge from a home. Has complicated recent medical history. No current percutaneous drains in. Has not completed antibiotic course "I have 2 more bottles".     Through speaking with the patient, I have identified a concerning living situation. She endorses difficulty ambulating in her home due to excess of stuff. There is a poor hygiene situation with many pets and poor ability to get around and clean, plus multiple health facility stays recently. Patient endorses having a hard time getting food but that she has neighbors or friends bring her meals from time to time and take after her cats but that is inadequate. She has no heat on in her house.  She describes having multiple large pieces of glass throughout her home which pose a safety concern since she falls frequently.  Patient is on blood thinner, further making frequent falls and unsafe living environment more dangerous. She cannot fit her walker in her home so relies on cane. She fell on her knee several days ago and it swelled up but has improved now. Her neighbor couldn't help her get up and so they had to call an ambulance to get her up. She is going to see someone tomorrow for her regular knee injections.  Has sores on her backside and unable to clean it. She is wearing a wig which is unkempt and a coat with stains that appear to be and smell of urine.   Smoking status reviewed   ROS: pertinent noted in the HPI   I have personally reviewed pertinent past medical history, surgical, family, and social history as appropriate. Objective:  BP 110/80   Vitals and nursing note reviewed  General: NAD, pleasant, unkempt and poor hygiene, able to participate in exam Cardiac: RRR, S1 S2 present. normal heart  sounds, no murmurs. Respiratory: CTAB, normal effort, No wheezes, rales or rhonchi Extremities: no edema or cyanosis. R knee positive for diffuse tenderness and mild swelling. Skin: warm and dry, no rashes noted. Has superficial fissure in superior gluteal cleft.  Neuro: alert, no obvious focal deficits. Walks with walker Psych: Normal affect and mood  Assessment & Plan:   Unsatisfactory living conditions Sending referral to CCM.   Orders Placed This Encounter  Procedures  . Ambulatory referral to Chronic Care Management Services    Referral Priority:   Routine    Referral Type:   Consultation    Referral Reason:   Care Coordination    Number of Visits Requested:   [place], [place] PGY-2  This note is not being shared with the patient for the following reason: To preserve the description of condition which could be taken offensively and damage healthcare relationship.

## 2019-10-16 NOTE — Patient Instructions (Signed)
It was a pleasure to see you today!  To summarize our discussion for this visit:  I'm sorry to hear that you have had such a rough time lately.  I am going to send a message to our social worker Neoma Laming to help you with resources.   Some additional health maintenance measures we should update are: Health Maintenance Due  Topic Date Due  . TETANUS/TDAP  01/30/2018  . INFLUENZA VACCINE  06/08/2019  .    Call the clinic at [phone] if your symptoms worsen or you have any concerns.   Thank you for allowing me to take part in your care,  Dr. Doristine Mango

## 2019-10-17 DIAGNOSIS — M170 Bilateral primary osteoarthritis of knee: Secondary | ICD-10-CM | POA: Diagnosis not present

## 2019-10-22 DIAGNOSIS — M25561 Pain in right knee: Secondary | ICD-10-CM | POA: Diagnosis not present

## 2019-10-22 DIAGNOSIS — M6281 Muscle weakness (generalized): Secondary | ICD-10-CM | POA: Diagnosis not present

## 2019-10-22 DIAGNOSIS — R262 Difficulty in walking, not elsewhere classified: Secondary | ICD-10-CM | POA: Diagnosis not present

## 2019-10-22 DIAGNOSIS — M170 Bilateral primary osteoarthritis of knee: Secondary | ICD-10-CM | POA: Diagnosis not present

## 2019-10-23 ENCOUNTER — Ambulatory Visit: Payer: Self-pay | Admitting: Licensed Clinical Social Worker

## 2019-10-23 DIAGNOSIS — Z591 Inadequate housing: Secondary | ICD-10-CM | POA: Insufficient documentation

## 2019-10-23 NOTE — Assessment & Plan Note (Signed)
Sending referral to CCM.

## 2019-10-23 NOTE — Chronic Care Management (AMB) (Signed)
  Social Work Care Management  Outreach   10/23/2019 Name: Kristen [name] MRN: [id number] DOB: 1941/03/26  Referred by: Dr. Ouida Sills Reason for referral : Care Coordination (housing and social concerns)  An unsuccessful telephone outreach was attempted today. The patient was referred to the case management team by for assistance with care management and care coordination.   Follow Up Plan: A HIPPA compliant phone message was left for the patient providing contact information and requesting a return call. LCSW will reach out to the patient again over the next 2 to 3 days days.   Casimer Lanius, LCSW Clinical Social Worker [place] / [place]   [phone] 3:07 PM

## 2019-10-24 ENCOUNTER — Other Ambulatory Visit: Payer: Self-pay

## 2019-10-25 ENCOUNTER — Telehealth: Payer: Medicare Other

## 2019-10-25 ENCOUNTER — Ambulatory Visit: Payer: Self-pay | Admitting: Licensed Clinical Social Worker

## 2019-10-25 ENCOUNTER — Encounter: Payer: Self-pay | Admitting: Licensed Clinical Social Worker

## 2019-10-25 ENCOUNTER — Ambulatory Visit: Payer: Medicare Other | Admitting: Licensed Clinical Social Worker

## 2019-10-25 DIAGNOSIS — I5033 Acute on chronic diastolic (congestive) heart failure: Secondary | ICD-10-CM

## 2019-10-25 DIAGNOSIS — M170 Bilateral primary osteoarthritis of knee: Secondary | ICD-10-CM

## 2019-10-25 DIAGNOSIS — Z7189 Other specified counseling: Secondary | ICD-10-CM

## 2019-10-25 DIAGNOSIS — F411 Generalized anxiety disorder: Secondary | ICD-10-CM

## 2019-10-25 MED ORDER — LISINOPRIL-HYDROCHLOROTHIAZIDE 20-25 MG PO TABS: 0.5000 | ORAL_TABLET | Freq: Every day | ORAL | 1 refills | Status: DC

## 2019-10-25 NOTE — Chronic Care Management (AMB) (Signed)
Care Management   Clinical Social Work General Note  10/25/2019 Name: Kristen [name] MRN: [id number] DOB: [birth year]  Kristen [name] is a 78 y.o. year old female who is a primary care patient of [name], MD. The Care Management team was consulted by Dr. Ouida Sills to assist the patient with [facility]  and Level of Care Concerns.   Kristen [name] was given information about Care Management services today including:  1. Care Management services include personalized support from designated clinical staff supervised by her physician, including individualized plan of care and coordination with other care providers 2. 24/7 contact phone numbers for assistance for urgent and routine care needs. 3. The patient may stop care management services at any time (effective at the end of the month) by phone call to the office staff.  Patient agreed to services and verbal consent obtained.   Review of patient status, including review of consultants reports, relevant laboratory and other test results, and collaboration with appropriate care team members and the patient's provider was performed as part of comprehensive patient evaluation and provision of chronic care management services.    Outpatient Encounter Medications as of 10/25/2019  Medication Sig Note  . alendronate (FOSAMAX) 10 MG tablet TAKE (1) TABLET BY MOUTH PER WEEK TAKE ON EMPTY STOMACH WITH FULL GLASS OF WATER   . allopurinol (ZYLOPRIM) 100 MG tablet TAKE 1 TABLET BY MOUTH EVERY DAY (Patient taking differently: Take 100 mg by mouth daily. )   . calcium carbonate (OSCAL) 1500 (600 Ca) MG TABS tablet Take 1,500 mg by mouth daily.    . calcium carbonate (TUMS - DOSED IN MG ELEMENTAL CALCIUM) 500 MG chewable tablet Chew 1 tablet by mouth as needed for indigestion or heartburn.   . cholecalciferol (VITAMIN D3) 25 MCG (1000 UT) tablet Take 1,000 Units by mouth daily.   Marland Kitchen CINNAMON PO Take 1 tablet by mouth daily.    . clindamycin  (CLINDAGEL) 1 % gel APPLY TO AFFECTED AREA TWICE A DAY (Patient taking differently: Apply 1 application topically 2 (two) times daily. )   . colchicine 0.6 MG tablet Take 1 tablet (0.6 mg total) by mouth 2 (two) times daily. Take twice daily until pain resolves and then take as needed for gout flare. (Patient taking differently: Take 0.6 mg by mouth 2 (two) times daily as needed. Take twice daily until pain resolves and then take as needed for gout flare.)   . [employer] ANTI-FUNGAL 2 % powder APPLY TO AFFECTED AREA TWICE A DAY AFTER SKIN RESOLVES FROM KETOCONAZOLE CREAM (Patient taking differently: as needed. )   . diazepam (VALIUM) 5 MG tablet TAKE 1/2 TABLET (2.5 MG TOTAL) BY MOUTH AT BEDTIME.   Marland Kitchen ELIQUIS 5 MG TABS tablet TAKE 1 TABLET BY MOUTH TWICE A DAY (Patient taking differently: Take 5 mg by mouth 2 (two) times daily. ) 08/28/2019: Pt has not taken since before surgery  . Flax Oil-Fish Oil-Borage Oil (FISH-FLAX-BORAGE) CAPS Take 1 capsule by mouth daily.   . fluconazole (DIFLUCAN) 200 MG tablet Take 1 tablet (200 mg total) by mouth daily.   . furosemide (LASIX) 20 MG tablet Take 1 tablet (20 mg total) by mouth daily. (Patient taking differently: Take 20 mg by mouth daily. )   . glucosamine-chondroitin (GLUCOSAMINE-CHONDROITIN DS) 500-400 MG tablet Take 1 tablet by mouth daily.    Marland Kitchen HYDROcodone-acetaminophen (NORCO/VICODIN) 5-325 MG tablet Take 1 tablet by mouth every 6 (six) hours as needed for moderate pain.   Marland Kitchen ipratropium-albuterol (DUONEB)  0.5-2.5 (3) MG/3ML SOLN Take 3 mLs by nebulization every 6 (six) hours as needed.   Marland Kitchen lisinopril-hydrochlorothiazide (ZESTORETIC) 20-25 MG tablet Take 0.5 tablets by mouth daily.   Marland Kitchen loratadine (CLARITIN) 10 MG tablet Take 1 tablet (10 mg total) by mouth daily.   . metoprolol succinate (TOPROL-XL) 100 MG 24 hr tablet Take 1 tablet (100 mg total) by mouth daily. Take with or immediately following a meal. (Patient taking differently: Take 100 mg by mouth daily.  Take with or immediately following a meal.)   . Multiple Vitamins-Minerals (MULTIVITAMIN WOMEN 50+) TABS Take 1 tablet by mouth daily.    . mupirocin ointment (BACTROBAN) 2 % APPLY TOPICALLY TO AFFECTED AREA TWICE DAILY (Patient taking differently: as needed. )   . Nutritional Supplements (ECHINACEA/GOLDEN SEAL PO) Take 1 tablet by mouth daily.    . pantoprazole (PROTONIX) 40 MG tablet Take 1 tablet (40 mg total) by mouth daily.   . polycarbophil (FIBERCON) 625 MG tablet Take 625 mg by mouth daily.   . polyethylene glycol (MIRALAX / GLYCOLAX) 17 g packet Take 17 g by mouth 2 (two) times daily.   . potassium chloride (KLOR-CON) 20 MEQ packet MIX 1 PACKET IN LIQUID AND TAKE BY MOUTH DAILY AS DIRECTED (Patient taking differently: Take 20 mEq by mouth daily. MIX 1 PACKET IN LIQUID AND TAKE BY MOUTH DAILY AS DIRECTED)   . Probiotic Product (PROBIOTIC-10 PO) Take 1 tablet by mouth daily.    Marland Kitchen senna-docusate (SENOKOT-S) 8.6-50 MG tablet Take 1 tablet by mouth 2 (two) times daily.   . TURMERIC PO Take 1 tablet by mouth daily.     No facility-administered encounter medications on file as of 10/25/2019.   SDOH (Social Determinants of Health) screening performed today: Housing concerns and Food Insecurity . See Care Plan for related entries.  Goals Addressed            This Visit's Progress   . Client needs help with ADL's       Current Barriers:  . Unmet ADL's and light house keeping need for patient with CHF, CKD , and Anxiety . Patient needs support, education, and care coordination  to explore options to meet needs. . Patient does not qualify for personal care services Clinical Goal(s)  . Over the next 30 days, patient will work with LCSW to address concerns related to meeting personal care needs, . Over the next 30 days, patient will contact DSS and Health Department to get on wait list for In-Home Aide Service and [place] Program ( phone numbers provided) Interventions :   . Assessed patient's care coordination needs and discussed ongoing care management follow up  . Provided patient with information about Personal Care community support options . Advised patient to call so that she could be placed on the wait list.  Patient Self Care Activities & Deficits:  . Patient is able to contact agencies discussed today . Patient is unable to independently navigate community resource options without care coordination support . Acknowledges deficits related to resources and is motivated to resolve concern  Initial goal documentation    . Unable to prepare meals       Current Barriers:  . Unmet food need for patient with CHF, CKD , and Anxiety . Patient needs support, education, and care coordination to meet this need . Patient is unable to prepare meals  . Has limited food without community support programs ( which will end soon) Clinical Goal(s)  . Over the next  30 days, patient will work with LCSW to address concerns related to food insecurities  . Over the next 30 days, LCSW will explore new food resources for patient Interventions :  . Assessed patient's care coordination needs and discussed ongoing care management follow up  . Provided patient with information about meals on wheels . Meals on Wheels referral made via [employer] Patient Self Care Activities & Deficits:  . Patient is able to contact friend to provide food if needed and is able to warm food  . Patient is unable to independently navigate community resource options without care coordination support . Acknowledges deficits related to resources and is glad to have support  . Limited support system Initial goal documentation       Follow Up Plan: phone appointment scheduled with LCSW on 11/06/2019      Casimer Lanius, [place] / [place]   [phone] 4:32 PM

## 2019-10-25 NOTE — Patient Instructions (Signed)
Licensed Clinical Social Worker Visit Information Ms. Gombos  it was nice speaking with you. Please call me directly if you have questions [phone] Goals we discussed today:  Goals Addressed            This Visit's Progress   . Client needs help with ADL's       Current Barriers:  . Unmet ADL's and light house keeping need for patient with CHF, CKD , and Anxiety . Patient needs support, education, and care coordination  to explore options to meet needs. . Patient does not qualify for personal care services Clinical Goal(s)  . Over the next 30 days, patient will work with LCSW to address concerns related to meeting personal care needs, . Over the next 30 days, patient will contact DSS and Health Department to get on wait list for In-Home Aide Service and [place] Program ( phone numbers provided) Interventions :  . Assessed patient's care coordination needs and discussed ongoing care management follow up  . Provided patient with information about Personal Care community support options . Advised patient to call so that she could be placed on the wait list.  Patient Self Care Activities & Deficits:  . Patient is able to contact agencies discussed today . Patient is unable to independently navigate community resource options without care coordination support . Acknowledges deficits related to resources and is motivated to resolve concern  Initial goal documentation    . Unable to prepare meals       Current Barriers:  . Unmet food need for patient with CHF, CKD , and Anxiety . Patient needs support, education, and care coordination to meet this need . Patient is unable to prepare meals  . Has limited food without community support programs ( which will end soon) Clinical Goal(s)  . Over the next 30 days, patient will work with LCSW to address concerns related to food insecurities  . Over the next 30 days, LCSW will explore new food resources for  patient Interventions :  . Assessed patient's care coordination needs and discussed ongoing care management follow up  . Provided patient with information about meals on wheels . Meals on Wheels referral made via [employer] Patient Self Care Activities & Deficits:  . Patient is able to contact friend to provide food if needed and is able to warm food  . Patient is unable to independently navigate community resource options without care coordination support . Acknowledges deficits related to resources and is glad to have support  . Limited support system Initial goal documentation       Materials provided:  Ms. Burdo was given information about Care Management services today including:  1. Care Management services include personalized support from designated clinical staff supervised by her physician, including individualized plan of care and coordination with other care providers 2. 24/7 contact [phone] for assistance for urgent and routine care needs. 3. Care Management services at any time by phone call to the office staff.  Patient agreed to services and verbal consent obtained.  The patient verbalized understanding of instructions provided today and declined a print copy of patient instruction materials.  Follow up plan: F/U phone appointment with LCSW 11/06/2019  Maurine Cane, LCSW

## 2019-10-25 NOTE — Chronic Care Management (AMB) (Signed)
  Social Work Care Management  2nd unsuccessful Outreach   10/25/2019 Name: Kristen [name] MRN: [id number] DOB: 01/02/1941  Referred by: Dr. Ouida Sills Reason for referral : Care Coordination ( assess psychosocial needs)  A second unsuccessful telephone outreach was attempted today. The patient was referred to the case management team for assistance with care management and care coordination.   Follow Up Plan: If no return call is received LCSW will reach out to the patient again over the next 2 weeks days.  Casimer Lanius, LCSW Clinical Social Worker [place] / [place]   [phone] 9:43 AM

## 2019-11-06 ENCOUNTER — Other Ambulatory Visit: Payer: Self-pay

## 2019-11-06 ENCOUNTER — Ambulatory Visit: Payer: Medicare Other | Admitting: Licensed Clinical Social Worker

## 2019-11-06 ENCOUNTER — Encounter: Payer: Self-pay | Admitting: Licensed Clinical Social Worker

## 2019-11-06 DIAGNOSIS — Z7189 Other specified counseling: Secondary | ICD-10-CM

## 2019-11-06 DIAGNOSIS — F411 Generalized anxiety disorder: Secondary | ICD-10-CM

## 2019-11-06 DIAGNOSIS — Z591 Inadequate housing: Secondary | ICD-10-CM

## 2019-11-06 DIAGNOSIS — I5033 Acute on chronic diastolic (congestive) heart failure: Secondary | ICD-10-CM

## 2019-11-06 NOTE — Chronic Care Management (AMB) (Signed)
Social Work Care Management  Follow Up   11/06/2019 Name: MAEVE DEBORD MRN: [id number] DOB: 22-Nov-1940  Referred by: [name], MD Reason for referral : Care Coordination (F/U call  )  Dalesha Stanback Mignone is a 78 y.o. year old female who is a primary care patient of [name], MD.  Reason for follow-up: phone encounter with patient today for ongoing assessment and brief interventions to assist with managing psychosocial stressors and care coordination needs.  SDOH (Social Determinants of Health) screening performed today: See Care Plan for related entries.  Advanced Directives: See Care Plan and [employer] application for related entries.   Review of patient status, including review of consultants reports, relevant laboratory and other test results, and collaboration with appropriate care team members and the patient's provider was performed as part of comprehensive patient evaluation and provision of chronic care management services.   Goals Addressed            This Visit's Progress   . Client needs help with ADL's   Not on track    Current Barriers:  . Unmet ADL's and light house keeping need for patient with CHF, THN and Anxiety . Patient needs support, education, and care coordination  to explore options to meet needs. . Patient has not contacted in-home aide and community resources provided during last encounter Clinical Goal(s)  . Over the next 30 days, patient will work with LCSW to address concerns related to meeting personal care needs from other community options . Over the next 30 days, patient will contact DSS and Health Department to get on wait list for In-Home Aide Service and [place] Program ( phone numbers provided) Interventions :  . Assessed patient's care coordination needs  . Assessed barriers to patient contacting community resources  . Provided patient with phone number again about Personal Care community support options . Encouraged  patient to call so that she could be placed on the wait list.  Patient Self Care Activities & Deficits:  . Patient is able to contact agencies discussed today . Patient is unable to independently navigate community resource options without care coordination support.  . Acknowledges deficits related to resources and is motivated to resolve concern  Please see past updates related to this goal by clicking on the "Past Updates" button in the selected goal      . Declutter House       Current Barriers:  . patient with CHF, HTN, and Anxiety needs support with down sizing and de clutter  Clinical Social Work Goal(s): Over the next 60 days,  . patient will work with SW bi-weekly by telephone to set goals to reduce clutter in her  . Patient will implement clinical interventions discussed today . patient will work with cousin to assist with this process to give things away to identify agencies to give them to. Clinical Interventions :  . Assessed patient's care coordination needs and discussed housing options  . Assisted patient with setting goals to remain in her home . Solution focussed intervention and motivational interviewing also utilized  Patient Self Care Activities & Deficits:  . Patient is able to implement clinical interventions discussed today . Patient is able to contact cousin as discussed today . Acknowledges deficits and is motivated to resolve concern  Initial goal documentation     . Unable to prepare meals   On track    Current Barriers:  . Unmet food need for patient with CHF, HTN , and Anxiety .  Patient needs support, education, and care coordination to meet this need . Patient is unable to prepare meals heating up meals Clinical Goal(s)  . Over the next 30 days, patient will work with LCSW to address concerns related to food insecurities  . Over the next 30 days, LCSW will explore new food resources for patient Interventions :  . Assessed patient's care coordination  needs and discussed ongoing care management follow up  . LCSW contacted [employer] for [employer] delivery  . Meals on Wheels referral made via [employer]  Patient Self Care Activities & Deficits:  . Patient is able to contact friend to provide food if needed and is able to warm food  . Patient is unable to independently navigate community resource options without care coordination support . Acknowledges deficits related to resources and is glad to have support  . Limited support system Please see past updates related to this goal by clicking on the "Past Updates" button in the selected goal        Plan:LCSW will F/U with patient in 2 weeks  Casimer Lanius, [place] / [place]   [phone] 3:07 PM

## 2019-11-06 NOTE — Patient Instructions (Signed)
Licensed Clinical Social Worker Visit Information Ms. Luten  it was nice speaking with you. Please call me directly if you have questions [phone] Goals we discussed today:  Goals Addressed            This Visit's Progress   . Client needs help with ADL's   Not on track    Current Barriers:  . Unmet ADL's and light house keeping need for patient with CHF, THN and Anxiety . Patient needs support, education, and care coordination  to explore options to meet needs. . Patient has not contacted in-home aide and community resources provided during last encounter Clinical Goal(s)  . Over the next 30 days, patient will work with LCSW to address concerns related to meeting personal care needs from other community options . Over the next 30 days, patient will contact DSS and Health Department to get on wait list for In-Home Aide Service and [place] Program ( phone numbers provided) Interventions :  . Assessed patient's care coordination needs  . Assessed barriers to patient contacting community resources  . Provided patient with phone number again about Personal Care community support options . Encouraged patient to call so that she could be placed on the wait list.  Patient Self Care Activities & Deficits:  . Patient is able to contact agencies discussed today . Patient is unable to independently navigate community resource options without care coordination support.  . Acknowledges deficits related to resources and is motivated to resolve concern  Please see past updates related to this goal by clicking on the "Past Updates" button in the selected goal      . Declutter House       Current Barriers:  . patient with CHF, HTN, and Anxiety needs support with down sizing and de clutter  Clinical Social Work Goal(s): Over the next 60 days,  . patient will work with SW bi-weekly by telephone to set goals to reduce clutter in her  . Patient will implement clinical  interventions discussed today . patient will work with cousin to assist with this process to give things away to identify agencies to give them to. Clinical Interventions :  . Assessed patient's care coordination needs and discussed housing options  . Assisted patient with setting goals to remain in her home . Solution focussed intervention and motivational interviewing also utilized  Patient Self Care Activities & Deficits:  . Patient is able to implement clinical interventions discussed today . Patient is able to contact cousin as discussed today . Acknowledges deficits and is motivated to resolve concern  Initial goal documentation     . Unable to prepare meals   On track    Current Barriers:  . Unmet food need for patient with CHF, HTN , and Anxiety . Patient needs support, education, and care coordination to meet this need . Patient is unable to prepare meals heating up meals Clinical Goal(s)  . Over the next 30 days, patient will work with LCSW to address concerns related to food insecurities  . Over the next 30 days, LCSW will explore new food resources for patient Interventions :  . Assessed patient's care coordination needs and discussed ongoing care management follow up  . LCSW contacted [employer] for [employer] delivery  . Meals on Wheels referral made via [employer]  Patient Self Care Activities & Deficits:  . Patient is able to contact friend to provide food if needed and is able to warm food  . Patient is unable  to independently navigate community resource options without care coordination support . Acknowledges deficits related to resources and is glad to have support  . Limited support system Please see past updates related to this goal by clicking on the "Past Updates" button in the selected goal       Materials provided:  Ms. Beidler was given information about Care Management services today including:  1. Care Management services include personalized support from  designated clinical staff supervised by her physician, including individualized plan of care and coordination with other care providers 2. 24/7 contact [phone] for assistance for urgent and routine care needs. 3. Care Management services at any time by phone call to the office staff.  The patient verbalized understanding of instructions provided today and declined a print copy of patient instruction materials.  Follow up plan:  SW will follow up with patient by phone over the next 2 weeks  Maurine Cane, LCSW

## 2019-11-07 DIAGNOSIS — M170 Bilateral primary osteoarthritis of knee: Secondary | ICD-10-CM | POA: Diagnosis not present

## 2019-11-07 DIAGNOSIS — M25562 Pain in left knee: Secondary | ICD-10-CM | POA: Diagnosis not present

## 2019-11-07 DIAGNOSIS — R262 Difficulty in walking, not elsewhere classified: Secondary | ICD-10-CM | POA: Diagnosis not present

## 2019-11-07 DIAGNOSIS — R6889 Other general symptoms and signs: Secondary | ICD-10-CM | POA: Diagnosis not present

## 2019-11-07 DIAGNOSIS — M6281 Muscle weakness (generalized): Secondary | ICD-10-CM | POA: Diagnosis not present

## 2019-11-12 ENCOUNTER — Other Ambulatory Visit: Payer: Self-pay | Admitting: Gastroenterology

## 2019-11-12 DIAGNOSIS — R932 Abnormal findings on diagnostic imaging of liver and biliary tract: Secondary | ICD-10-CM | POA: Diagnosis not present

## 2019-11-12 DIAGNOSIS — K529 Noninfective gastroenteritis and colitis, unspecified: Secondary | ICD-10-CM | POA: Diagnosis not present

## 2019-11-14 DIAGNOSIS — M25561 Pain in right knee: Secondary | ICD-10-CM | POA: Diagnosis not present

## 2019-11-14 DIAGNOSIS — M25562 Pain in left knee: Secondary | ICD-10-CM | POA: Diagnosis not present

## 2019-11-14 DIAGNOSIS — M6281 Muscle weakness (generalized): Secondary | ICD-10-CM | POA: Diagnosis not present

## 2019-11-14 DIAGNOSIS — R262 Difficulty in walking, not elsewhere classified: Secondary | ICD-10-CM | POA: Diagnosis not present

## 2019-11-15 ENCOUNTER — Other Ambulatory Visit: Payer: Self-pay | Admitting: Cardiology

## 2019-11-18 ENCOUNTER — Telehealth: Payer: Self-pay

## 2019-11-18 NOTE — Telephone Encounter (Signed)
Pt calls nurse line regarding questions concerning continuation of augmentin. Pt states that she was prescribed this medication after hospitalization for pneumonia and collapsed lung. Pt states that she was told she may need to stay on this medication for long-term management.   Please advise  To PCP.   Talbot Grumbling, RN

## 2019-11-20 ENCOUNTER — Ambulatory Visit: Payer: Self-pay | Admitting: Licensed Clinical Social Worker

## 2019-11-20 ENCOUNTER — Telehealth: Payer: Medicare Other

## 2019-11-20 ENCOUNTER — Other Ambulatory Visit: Payer: Self-pay

## 2019-11-20 NOTE — Chronic Care Management (AMB) (Signed)
  Care Management   Clinical Social Work Care Outreach  11/20/2019 Name: Kristen [name] MRN: [id number] DOB: [birth year]  Kristen [name] is a 79 y.o. year old female who is a primary care patient of [name], MD .   LCSW reached out to [facility] today by phone for Care Management services follow up.  A HIPPA compliant phone message was left for the patient providing contact information and requesting a return call.   Follow Up Plan:LCSW will wait for return call, if no return call is received will reach out to patient again over the next 3 to 7 days.  Casimer Lanius, LCSW Clinical Social Worker [place] / [place]   [phone] 10:13 AM

## 2019-11-21 DIAGNOSIS — M6281 Muscle weakness (generalized): Secondary | ICD-10-CM | POA: Diagnosis not present

## 2019-11-21 DIAGNOSIS — M25562 Pain in left knee: Secondary | ICD-10-CM | POA: Diagnosis not present

## 2019-11-21 DIAGNOSIS — M25561 Pain in right knee: Secondary | ICD-10-CM | POA: Diagnosis not present

## 2019-11-21 DIAGNOSIS — R6889 Other general symptoms and signs: Secondary | ICD-10-CM | POA: Diagnosis not present

## 2019-11-21 DIAGNOSIS — R262 Difficulty in walking, not elsewhere classified: Secondary | ICD-10-CM | POA: Diagnosis not present

## 2019-11-22 ENCOUNTER — Ambulatory Visit: Payer: Medicare Other | Admitting: Licensed Clinical Social Worker

## 2019-11-22 ENCOUNTER — Telehealth: Payer: Self-pay

## 2019-11-22 DIAGNOSIS — I5033 Acute on chronic diastolic (congestive) heart failure: Secondary | ICD-10-CM

## 2019-11-22 DIAGNOSIS — F411 Generalized anxiety disorder: Secondary | ICD-10-CM

## 2019-11-22 DIAGNOSIS — Z7189 Other specified counseling: Secondary | ICD-10-CM

## 2019-11-22 DIAGNOSIS — I110 Hypertensive heart disease with heart failure: Secondary | ICD-10-CM

## 2019-11-22 NOTE — Telephone Encounter (Signed)
Pt calls nurse line stating that she missed a phone call from Education officer, [employer]. Please return phone call to [phone].   Thanks  Talbot Grumbling, RN

## 2019-11-22 NOTE — Chronic Care Management (AMB) (Signed)
Social Work Care Management  Follow Up   11/22/2019 Name: Kristen [name] MRN: [id number] DOB: 1941-04-11  Referred by: [name], MD Reason for referral : Care Coordination (follow up call)  Kristen [name] is a 79 y.o. year old female who is a primary care patient of [name], MD.  Reason for follow-up: phone encounter with patient today for ongoing assessment and brief interventions to assist with care coordination needs. Patient is preparing for outpatient surgery this month. Discussed options of participating in [place] where a volunteer would call patient, however she is not interested at this time. Patient is making small step on goals below.   SDOH (Social Determinants of Health) screening performed today: See Care Plan below for related entries.   Review of patient status, including review of consultants reports, relevant laboratory and other test results, and collaboration with appropriate care team members and the patient's provider was performed as part of comprehensive patient evaluation and provision of chronic care management services.   Goals Addressed            This Visit's Progress   . Client needs help with ADL's   On track    Current Barriers:  . Unmet ADL's and light house keeping need for patient with CHF, THN and Anxiety . Patient needs support, education, and care coordination  to explore options to meet needs. . Patient has not contacted in-home aide and community resources provided during last encounter Clinical Goal(s)  . Over the next 30 days, patient will work with LCSW to address concerns related to meeting personal care needs from other community options . Over the next 30 days, patient will contact DSS and Health Department to get on wait list for In-Home Aide Service and [place] [facility] ( phone numbers provided) Interventions :  . Assessed patient's care coordination needs  . Assessed barriers to patient contacting  community resources  . Called in-home aide program with patient on line today to assist with getting her on the wait list. Left message for [facility] in-home aide program [phone] to return call to patient to get on wait list.  Patient Self Care Activities & Deficits:  . Patient is able to contact agencies discussed today . Patient is unable to independently navigate community resource options without care coordination support.  . Acknowledges deficits related to resources and is motivated to resolve concern  Please see past updates related to this goal by clicking on the "Past Updates" button in the selected goal      . Declutter House   On track    Current Barriers:  . patient with CHF, HTN, and Anxiety needs support with down sizing and de clutter  Clinical Social Work Goal(s): Over the next 60 days,  . patient will work with by telephone to set goals to reduce clutter in her  . Patient will implement clinical interventions discussed today . patient will work with cousin to give things away to identify agencies to give them to. . Patient has been working to [employer] her home, has been doing a little each day. Clinical Interventions :  . Assessed patient's care coordination needs and discussed housing options  . Assisted patient with setting goals to clean home . Solution focussed intervention and motivational interviewing also utilized  Patient Self Care Activities & Deficits:  . Patient is able to implement clinical interventions discussed today . Patient is able to contact cousin as discussed today . Acknowledges deficits and is motivated to  resolve concern  Please see past updates related to this goal by clicking on the "Past Updates" button in the selected goal      .  meals   On track    Current Barriers:  . Ongoing food need for patient with CHF, HTN , and Anxiety . Patient needs support, education, and care coordination to meet this need . Patient is unable to prepare  meals, she is heating up purchased meals  Clinical Goal(s)  . Over the next 30 days, patient will work with LCSW to address concerns related to food insecurities  . Over the next 30 days, LCSW will explore new food resources for patient Interventions :  . Assessed patient's care coordination needs and discussed ongoing care management follow up  . LCSW contacted [employer], they will contact patient to start food delivery . Meals on Wheels referral made via [employer], patient is now on the wait list. Patient Self Care Activities & Deficits:  . Patient is able to contact friend to provide food if needed and is able to warm food  . Patient is unable to independently navigate community resource options without care coordination support . Acknowledges deficits related to resources and is glad to have support  . Limited support system Please see past updates related to this goal by clicking on the "Past Updates" button in the selected goal       Plan: follow up with patient in 3 to 4 weeks  Casimer Lanius, [place] / [place]   [phone] 3:00 PM

## 2019-11-22 NOTE — Patient Instructions (Addendum)
Licensed Clinical Social Worker Visit Information Ms. San  it was nice speaking with you. Please call me directly if you have questions [phone] Goals we discussed today:  Goals Addressed            This Visit's Progress   . Client needs help with ADL's   On track    Current Barriers:  . Unmet ADL's and light house keeping need for patient with CHF, THN and Anxiety . Patient needs support, education, and care coordination  to explore options to meet needs. . Patient has not contacted in-home aide and community resources provided during last encounter Clinical Goal(s)  . Over the next 30 days, patient will work with LCSW to address concerns related to meeting personal care needs from other community options . Over the next 30 days, patient will contact DSS and Health Department to get on wait list for In-Home Aide Service and [place] [facility] ( phone numbers provided) Interventions :  . Assessed patient's care coordination needs  . Assessed barriers to patient contacting community resources  . Called in-home aide program with patient on line today to assist with getting her on the wait list. Left message for [facility] in-home aide program [phone] to return call to patient to get on wait list.  Patient Self Care Activities & Deficits:  . Patient is able to contact agencies discussed today . Patient is unable to independently navigate community resource options without care coordination support.  . Acknowledges deficits related to resources and is motivated to resolve concern  Please see past updates related to this goal by clicking on the "Past Updates" button in the selected goal      . Declutter House   On track    Current Barriers:  . patient with CHF, HTN, and Anxiety needs support with down sizing and de clutter  Clinical Social Work Goal(s): Over the next 60 days,  . patient will work with by telephone to set goals to reduce clutter in her   . Patient will implement clinical interventions discussed today . patient will work with cousin to give things away to identify agencies to give them to. . Patient has been working to [employer] her home, has been doing a little each day. Clinical Interventions :  . Assessed patient's care coordination needs and discussed housing options  . Assisted patient with setting goals to clean home . Solution focussed intervention and motivational interviewing also utilized  Patient Self Care Activities & Deficits:  . Patient is able to implement clinical interventions discussed today . Patient is able to contact cousin as discussed today . Acknowledges deficits and is motivated to resolve concern  Please see past updates related to this goal by clicking on the "Past Updates" button in the selected goal      .  meals   On track    Current Barriers:  . Unmet ongoing food need for patient with CHF, HTN , and Anxiety . Patient needs support, education, and care coordination to meet this need . Patient is unable to prepare meals, she is heating up purchased meals Clinical Goal(s)  . Over the next 30 days, patient will work with LCSW to address concerns related to food insecurities  . Over the next 30 days, LCSW will explore new food resources for patient Interventions :  . Assessed patient's care coordination needs and discussed ongoing care management follow up  . LCSW contacted [employer], they will contact patient to start food  delivery . Meals on Wheels referral made via [employer], patient is now on the wait list. Patient Self Care Activities & Deficits:  . Patient is able to contact friend to provide food if needed and is able to warm food  . Patient is unable to independently navigate community resource options without care coordination support . Acknowledges deficits related to resources and is glad to have support  . Limited support system Please see past updates related to this goal by  clicking on the "Past Updates" button in the selected goal         Materials provided: Ms. Wrobel was given information about Care Management services today including:  1. Care Management services include personalized support from designated clinical staff supervised by her physician, including individualized plan of care and coordination with other care providers 2. 24/7 contact [phone] for assistance for urgent and routine care needs. 3. Care Management services at any time by phone call to the office staff.  The patient verbalized understanding of instructions provided today and declined a print copy of patient instruction materials.  Follow up plan:  SW will follow up with patient by phone over the next 3 to 4 weeks  Maurine Cane, LCSW

## 2019-11-27 ENCOUNTER — Telehealth: Payer: Medicare Other

## 2019-12-02 DIAGNOSIS — M25561 Pain in right knee: Secondary | ICD-10-CM | POA: Diagnosis not present

## 2019-12-02 DIAGNOSIS — R262 Difficulty in walking, not elsewhere classified: Secondary | ICD-10-CM | POA: Diagnosis not present

## 2019-12-02 DIAGNOSIS — M6281 Muscle weakness (generalized): Secondary | ICD-10-CM | POA: Diagnosis not present

## 2019-12-02 DIAGNOSIS — M170 Bilateral primary osteoarthritis of knee: Secondary | ICD-10-CM | POA: Diagnosis not present

## 2019-12-03 ENCOUNTER — Other Ambulatory Visit (HOSPITAL_COMMUNITY)
Admission: RE | Admit: 2019-12-03 | Discharge: 2019-12-03 | Disposition: A | Discharge status: Home / Self Care | Payer: Medicare Other | Source: Ambulatory Visit | Attending: Gastroenterology | Admitting: Gastroenterology

## 2019-12-03 DIAGNOSIS — Z20822 Contact with and (suspected) exposure to covid-19: Secondary | ICD-10-CM | POA: Diagnosis not present

## 2019-12-03 DIAGNOSIS — Z01812 Encounter for preprocedural laboratory examination: Secondary | ICD-10-CM | POA: Diagnosis not present

## 2019-12-03 LAB — SARS CORONAVIRUS 2 (TAT 6-24 HRS): SARS Coronavirus 2: NEGATIVE

## 2019-12-05 NOTE — Progress Notes (Signed)
preop call completed, answered questions, patient has tranportation through [facility].  Arrival time 0730

## 2019-12-06 ENCOUNTER — Ambulatory Visit (HOSPITAL_COMMUNITY): Payer: Medicare Other | Admitting: Anesthesiology

## 2019-12-06 ENCOUNTER — Encounter (HOSPITAL_COMMUNITY): Payer: Self-pay | Admitting: Gastroenterology

## 2019-12-06 ENCOUNTER — Other Ambulatory Visit: Payer: Self-pay

## 2019-12-06 ENCOUNTER — Ambulatory Visit (HOSPITAL_COMMUNITY)
Admission: RE | Admit: 2019-12-06 | Discharge: 2019-12-06 | Disposition: A | Discharge status: Home / Self Care | Payer: Medicare Other | Attending: Gastroenterology | Admitting: Gastroenterology

## 2019-12-06 ENCOUNTER — Encounter (HOSPITAL_COMMUNITY)
Admission: RE | Disposition: A | Discharge status: Home / Self Care | Payer: Self-pay | Source: Home / Self Care | Attending: Gastroenterology

## 2019-12-06 DIAGNOSIS — R6889 Other general symptoms and signs: Secondary | ICD-10-CM | POA: Diagnosis not present

## 2019-12-06 DIAGNOSIS — K529 Noninfective gastroenteritis and colitis, unspecified: Secondary | ICD-10-CM | POA: Diagnosis not present

## 2019-12-06 DIAGNOSIS — I4821 Permanent atrial fibrillation: Secondary | ICD-10-CM | POA: Insufficient documentation

## 2019-12-06 DIAGNOSIS — F419 Anxiety disorder, unspecified: Secondary | ICD-10-CM | POA: Insufficient documentation

## 2019-12-06 DIAGNOSIS — K219 Gastro-esophageal reflux disease without esophagitis: Secondary | ICD-10-CM | POA: Diagnosis not present

## 2019-12-06 DIAGNOSIS — M419 Scoliosis, unspecified: Secondary | ICD-10-CM | POA: Insufficient documentation

## 2019-12-06 DIAGNOSIS — Z7901 Long term (current) use of anticoagulants: Secondary | ICD-10-CM | POA: Diagnosis not present

## 2019-12-06 DIAGNOSIS — M109 Gout, unspecified: Secondary | ICD-10-CM | POA: Insufficient documentation

## 2019-12-06 DIAGNOSIS — R932 Abnormal findings on diagnostic imaging of liver and biliary tract: Secondary | ICD-10-CM | POA: Insufficient documentation

## 2019-12-06 DIAGNOSIS — I5033 Acute on chronic diastolic (congestive) heart failure: Secondary | ICD-10-CM | POA: Diagnosis not present

## 2019-12-06 DIAGNOSIS — Z79899 Other long term (current) drug therapy: Secondary | ICD-10-CM | POA: Diagnosis not present

## 2019-12-06 DIAGNOSIS — I110 Hypertensive heart disease with heart failure: Secondary | ICD-10-CM | POA: Insufficient documentation

## 2019-12-06 DIAGNOSIS — Z4659 Encounter for fitting and adjustment of other gastrointestinal appliance and device: Secondary | ICD-10-CM | POA: Diagnosis not present

## 2019-12-06 DIAGNOSIS — Z888 Allergy status to other drugs, medicaments and biological substances status: Secondary | ICD-10-CM | POA: Insufficient documentation

## 2019-12-06 DIAGNOSIS — F329 Major depressive disorder, single episode, unspecified: Secondary | ICD-10-CM | POA: Insufficient documentation

## 2019-12-06 DIAGNOSIS — M1990 Unspecified osteoarthritis, unspecified site: Secondary | ICD-10-CM | POA: Insufficient documentation

## 2019-12-06 HISTORY — PX: ESOPHAGOGASTRODUODENOSCOPY (EGD) WITH PROPOFOL: SHX5813

## 2019-12-06 HISTORY — PX: STENT REMOVAL: SHX6421

## 2019-12-06 SURGERY — ESOPHAGOGASTRODUODENOSCOPY (EGD) WITH PROPOFOL
Anesthesia: General

## 2019-12-06 MED ORDER — PROPOFOL 10 MG/ML IV BOLUS
INTRAVENOUS | Status: DC | PRN
  Administered 2019-12-06: 125 ug/kg/min via INTRAVENOUS

## 2019-12-06 MED ORDER — PROPOFOL 10 MG/ML IV BOLUS
INTRAVENOUS | Status: DC | PRN
  Administered 2019-12-06: 30 ug via INTRAVENOUS

## 2019-12-06 MED ORDER — LACTATED RINGERS IV SOLN: INTRAVENOUS | Status: DC

## 2019-12-06 MED ORDER — SODIUM CHLORIDE 0.9 % IV SOLN: INTRAVENOUS | Status: DC

## 2019-12-06 MED ORDER — LIDOCAINE HCL (CARDIAC) PF 100 MG/5ML IV SOSY
PREFILLED_SYRINGE | INTRAVENOUS | Status: DC | PRN
  Administered 2019-12-06: 100 mg via INTRAVENOUS

## 2019-12-06 SURGICAL SUPPLY — 14 items

## 2019-12-06 NOTE — Anesthesia Postprocedure Evaluation (Signed)
Anesthesia Post Note  Patient: Kristen [name]  Procedure(s) Performed: ESOPHAGOGASTRODUODENOSCOPY (EGD) WITH PROPOFOL (N/A )     Patient location during evaluation: PACU Anesthesia Type: MAC Level of consciousness: awake and alert Pain management: pain level controlled Vital Signs Assessment: post-procedure vital signs reviewed and stable Respiratory status: spontaneous breathing, nonlabored ventilation and respiratory function stable Cardiovascular status: stable and blood pressure returned to baseline Anesthetic complications: no    Last Vitals:  Vitals:   12/06/19 1000 12/06/19 1010  BP: (!) 114/39 120/63  Pulse: (!) 44 69  Resp: 16 18  Temp:    SpO2: 96% 97%    Last Pain:  Vitals:   12/06/19 1010  TempSrc:   PainSc: 0-No pain                 Audry Pili

## 2019-12-06 NOTE — Op Note (Signed)
[facility] Patient Name: Kristen [name] Procedure Date: 12/06/2019 MRN: [id number] Attending MD: Clarene Essex , MD Date of Birth: 01-Dec-1940 CSN: [id number] Age: 79 Admit Type: Outpatient Procedure:                Upper GI endoscopy Indications:              Biliary stent removal Providers:                Clarene Essex, MD, Cleda Daub, RN, Elspeth Cho                            Tech., Technician, Stephanie [place] CRNA Referring MD:              Medicines:                Propofol total dose 120 mg IV, 100 mg IV lidocaine Complications:            No immediate complications. Estimated Blood Loss:     Estimated blood loss: none. Procedure:                Pre-Anesthesia Assessment:                           - Prior to the procedure, a History and Physical                            was performed, and patient medications and                            allergies were reviewed. The patient's tolerance of                            previous anesthesia was also reviewed. The risks                            and benefits of the procedure and the sedation                            options and risks were discussed with the patient.                            All questions were answered, and informed consent                            was obtained. Prior Anticoagulants: The patient has                            taken Eliquis (apixaban), last dose was 2 days                            prior to procedure. ASA Grade Assessment: III - A                            patient with severe systemic disease. After  reviewing the risks and benefits, the patient was                            deemed in satisfactory condition to undergo the                            procedure.                           After obtaining informed consent, the endoscope was                            passed under direct vision. Throughout the                            procedure, the  patient's blood pressure, pulse, and                            oxygen saturations were monitored continuously. The                            [id number]-[id number] ([id number]) Olympus duodenoscope was                            introduced through the mouth, and advanced to the                            second part of duodenum. The upper GI endoscopy was                            accomplished without difficulty. The patient                            tolerated the procedure well. Scope In: Scope Out: Findings:      A previously placed plastic stent was seen in the second portion of the       duodenum. Stent removal was accomplished with a Raptor grasping device.      The exam was otherwise without abnormality. Although limited with the       use of side-viewing ERCP scope Impression:               - Plastic stent in the duodenum. Removed.                           - The examination was otherwise normal. Moderate Sedation:      Not Applicable - Patient had care per Anesthesia. Recommendation:           - Patient has a contact number available for                            emergencies. The signs and symptoms of potential                            delayed complications were discussed with the  patient. Return to normal activities tomorrow.                            Written discharge instructions were provided to the                            patient.                           - Soft diet today.                           - Resume Eliquis (apixaban) at prior dose today.                           - Return to GI clinic in 6 weeks.                           - Telephone GI clinic if symptomatic PRN. Procedure Code(s):        --- Professional ---                           [phone], Esophagogastroduodenoscopy, flexible,                            transoral; with removal of foreign body(s) Diagnosis Code(s):        --- Professional ---                           Z46.59, Encounter  for fitting and adjustment of                            other gastrointestinal appliance and device CPT copyright 2019 American Medical Association. All rights reserved. The codes documented in this report are preliminary and upon coder review may  be revised to meet current compliance requirements. Clarene Essex, MD 12/06/2019 9:42:11 AM This report has been signed electronically. Number of Addenda: 0

## 2019-12-06 NOTE — Transfer of Care (Signed)
Immediate Anesthesia Transfer of Care Note  Patient: Kristen [name]  Procedure(s) Performed: ESOPHAGOGASTRODUODENOSCOPY (EGD) WITH PROPOFOL (N/A )  Patient Location: PACU and Endoscopy Unit  Anesthesia Type:MAC  Level of Consciousness: awake, alert  and oriented  Airway & Oxygen Therapy: Patient Spontanous Breathing and Patient connected to face mask oxygen  Post-op Assessment: Report given to RN and Post -op Vital signs reviewed and stable  Post vital signs: Reviewed and stable  Last Vitals:  Vitals Value Taken Time  BP 113/88 12/06/19 0950  Temp 36.5 C 12/06/19 0945  Pulse 95 12/06/19 0950  Resp 18 12/06/19 0950  SpO2 98 % 12/06/19 0950  Vitals shown include unvalidated device data.  Last Pain:  Vitals:   12/06/19 0945  TempSrc: Temporal  PainSc: 0-No pain         Complications: No apparent anesthesia complications

## 2019-12-06 NOTE — Anesthesia Preprocedure Evaluation (Addendum)
Anesthesia Evaluation  Patient identified by MRN, date of birth, ID band Patient awake    Reviewed: Allergy & Precautions, NPO status , Patient's Chart, lab work & pertinent test results, reviewed documented beta blocker date and time   History of Anesthesia Complications Negative for: history of anesthetic complications  Airway Mallampati: II  TM Distance: >3 FB Neck ROM: Full    Dental  (+) Edentulous Upper, Edentulous Lower   Pulmonary neg pulmonary ROS,    Pulmonary exam normal        Cardiovascular hypertension, Pt. on home beta blockers and Pt. on medications + dysrhythmias Atrial Fibrillation  Rhythm:Irregular Rate:Normal   '20 TTE - EF 60 to 65%. Mildly increased left ventricular hypertrophy. Trace MR and TR. Moderately elevated pulmonary artery systolic pressure.    Neuro/Psych PSYCHIATRIC DISORDERS Anxiety Depression negative neurological ROS     GI/Hepatic Neg liver ROS, hiatal hernia, GERD  Medicated and Controlled,  Endo/Other  negative endocrine ROS  Renal/GU CRFRenal disease  Female GU complaint     Musculoskeletal  (+) Arthritis , Osteoarthritis,   Gout Scoliosis    Abdominal   Peds  Hematology negative hematology ROS (+)   Anesthesia Other Findings Covid neg 1/26   Reproductive/Obstetrics                            Anesthesia Physical Anesthesia Plan  ASA: III  Anesthesia Plan: MAC   Post-op Pain Management:    Induction: Intravenous  PONV Risk Score and Plan: 2 and Propofol infusion and Treatment may vary due to age or medical condition  Airway Management Planned: Natural Airway and Nasal Cannula  Additional Equipment: None  Intra-op Plan:   Post-operative Plan:   Informed Consent: I have reviewed the patients History and Physical, chart, labs and discussed the procedure including the risks, benefits and alternatives for the proposed anesthesia with  the patient or authorized representative who has indicated his/her understanding and acceptance.     Dental advisory given  Plan Discussed with: CRNA and Anesthesiologist  Anesthesia Plan Comments:       Anesthesia Quick Evaluation

## 2019-12-06 NOTE — Progress Notes (Signed)
Kristen [name] 8:29 AM  Subjective: Patient without any GI complaints and we rediscussed the procedure and no new problems since we had a recent tele visit  Objective: Vital signs stable afebrile no acute distress exam please see preassessment evaluation  Assessment: History of bile leak status post biliary stenting  Plan: Okay for EGD  with anesthesia assistance and stent removal today  [facility]  office [phone] After 5PM or if no answer call [phone]

## 2019-12-06 NOTE — Discharge Instructions (Signed)
Soft solids today call if question or problem otherwise follow-up in 6 weeks  YOU HAD AN ENDOSCOPIC PROCEDURE TODAY: Refer to the procedure report and other information in the discharge instructions given to you for any specific questions about what was found during the examination. If this information does not answer your questions, please call [facility] office at [phone] to clarify.   YOU SHOULD EXPECT: Some feelings of bloating in the abdomen. Passage of more gas than usual. Walking can help get rid of the air that was put into your GI tract during the procedure and reduce the bloating. If you had a lower endoscopy (such as a colonoscopy or flexible sigmoidoscopy) you may notice spotting of blood in your stool or on the toilet paper. Some abdominal soreness may be present for a day or two, also.  DIET: Your first meal following the procedure should be a light meal and then it is ok to progress to your normal diet. A half-sandwich or bowl of soup is an example of a good first meal. Heavy or fried foods are harder to digest and may make you feel nauseous or bloated. Drink plenty of fluids but you should avoid alcoholic beverages for 24 hours. If you had a esophageal dilation, please see attached instructions for diet.    ACTIVITY: Your care partner should take you home directly after the procedure. You should plan to take it easy, moving slowly for the rest of the day. You can resume normal activity the day after the procedure however YOU SHOULD NOT DRIVE, use power tools, machinery or perform tasks that involve climbing or major physical exertion for 24 hours (because of the sedation medicines used during the test).   SYMPTOMS TO REPORT IMMEDIATELY: A gastroenterologist can be reached at any hour. Please call [phone]  for any of the following symptoms:  . Following lower endoscopy (colonoscopy, flexible sigmoidoscopy) Excessive amounts of blood in the stool  Significant tenderness, worsening  of abdominal pains  Swelling of the abdomen that is new, acute  Fever of 100 or higher  . Following upper endoscopy (EGD, EUS, ERCP, esophageal dilation) Vomiting of blood or coffee ground material  New, significant abdominal pain  New, significant chest pain or pain under the shoulder blades  Painful or persistently difficult swallowing  New shortness of breath  Black, tarry-looking or red, bloody stools  FOLLOW UP:  If any biopsies were taken you will be contacted by phone or by letter within the next 1-3 weeks. Call [phone]  if you have not heard about the biopsies in 3 weeks.  Please also call with any specific questions about appointments or follow up tests.

## 2019-12-08 ENCOUNTER — Other Ambulatory Visit: Payer: Self-pay | Admitting: Family Medicine

## 2019-12-08 ENCOUNTER — Other Ambulatory Visit: Payer: Self-pay | Admitting: Cardiology

## 2019-12-08 DIAGNOSIS — F411 Generalized anxiety disorder: Secondary | ICD-10-CM

## 2019-12-09 ENCOUNTER — Encounter: Payer: Self-pay | Admitting: *Deleted

## 2019-12-12 ENCOUNTER — Other Ambulatory Visit: Payer: Self-pay | Admitting: Cardiology

## 2019-12-12 DIAGNOSIS — M25561 Pain in right knee: Secondary | ICD-10-CM | POA: Diagnosis not present

## 2019-12-12 DIAGNOSIS — R262 Difficulty in walking, not elsewhere classified: Secondary | ICD-10-CM | POA: Diagnosis not present

## 2019-12-12 DIAGNOSIS — M6281 Muscle weakness (generalized): Secondary | ICD-10-CM | POA: Diagnosis not present

## 2019-12-12 DIAGNOSIS — M170 Bilateral primary osteoarthritis of knee: Secondary | ICD-10-CM | POA: Diagnosis not present

## 2019-12-17 DIAGNOSIS — M25562 Pain in left knee: Secondary | ICD-10-CM | POA: Diagnosis not present

## 2019-12-17 DIAGNOSIS — M6281 Muscle weakness (generalized): Secondary | ICD-10-CM | POA: Diagnosis not present

## 2019-12-17 DIAGNOSIS — M25561 Pain in right knee: Secondary | ICD-10-CM | POA: Diagnosis not present

## 2019-12-17 DIAGNOSIS — R262 Difficulty in walking, not elsewhere classified: Secondary | ICD-10-CM | POA: Diagnosis not present

## 2019-12-19 DIAGNOSIS — M170 Bilateral primary osteoarthritis of knee: Secondary | ICD-10-CM | POA: Diagnosis not present

## 2019-12-20 ENCOUNTER — Other Ambulatory Visit: Payer: Self-pay

## 2019-12-20 ENCOUNTER — Ambulatory Visit: Payer: Self-pay | Admitting: Licensed Clinical Social Worker

## 2019-12-20 ENCOUNTER — Telehealth: Payer: Medicare Other

## 2019-12-20 NOTE — Chronic Care Management (AMB) (Signed)
  Social Work Care Management  Unsuccessful Phone Outreach   12/20/2019 Name: Kristen [name] MRN: [id number] DOB: Jul 31, 1941  Referred by: [name], MD,  Reason for referral : Care Coordination (resource update)   Kristen [name] is a 79 y.o. year old female who sees [name], MD for primary care.    Follow up called patient to assess ongoingcare coordination needs and barriers. Telephone outreach was unsuccessful. A HIPPA compliant phone message was left for the patient providing contact information and requesting a return call.  Plan:  If no return call is received. LCSW will call again in 3 to 5 days.  Kristen Lanius, LCSW Clinical Social Worker [place] / [place]   [phone] 1:41 PM

## 2019-12-25 DIAGNOSIS — R262 Difficulty in walking, not elsewhere classified: Secondary | ICD-10-CM | POA: Diagnosis not present

## 2019-12-25 DIAGNOSIS — M25561 Pain in right knee: Secondary | ICD-10-CM | POA: Diagnosis not present

## 2019-12-25 DIAGNOSIS — M25562 Pain in left knee: Secondary | ICD-10-CM | POA: Diagnosis not present

## 2019-12-25 DIAGNOSIS — M6281 Muscle weakness (generalized): Secondary | ICD-10-CM | POA: Diagnosis not present

## 2019-12-27 ENCOUNTER — Ambulatory Visit: Payer: Medicare Other | Admitting: Licensed Clinical Social Worker

## 2019-12-27 DIAGNOSIS — Z7189 Other specified counseling: Secondary | ICD-10-CM

## 2019-12-27 DIAGNOSIS — M170 Bilateral primary osteoarthritis of knee: Secondary | ICD-10-CM

## 2019-12-27 DIAGNOSIS — F411 Generalized anxiety disorder: Secondary | ICD-10-CM

## 2019-12-27 NOTE — Patient Instructions (Signed)
Licensed Clinical Social Worker Visit Information Ms. Bronder  it was nice speaking with you. Please call me directly if you have questions [phone] Goals we discussed today:  Goals Addressed            This Visit's Progress   . Declutter House   On track    Current Barriers/ Progress:  . patient with CHF, HTN, and Anxiety needs support with down sizing and de clutter  . Patient continues to declutter her home, has been doing a little each day. Clinical Social Work Goal(s): Over the next 365 days patient will have house de cluttered as evidence of giving things away and finding a new home for her items.  Clinical Interventions :  . Assessed patient's care coordination needs  . Assisted patient with setting goals to de clutter home . Solution focussed intervention and motivational interviewing also utilized  Patient Self Care Activities & Deficits:  . Patient is able to implement clinical interventions discussed today . Patient is able to work on small steps each day as discussed today . Acknowledges deficits and is motivated to resolve concern  Please see past updates related to this goal by clicking on the "Past Updates" button in the selected goal      . meals   On track    Current Barriers:  . Ongoing food need for patient with CHF, HTN , and Anxiety . Patient needs support, education, and care coordination to meet this need . Patient is unable to prepare meals , heating up purchased meals . Patient is able to prepare small items that does not requires her to stand for long periods of time Clinical Goal(s)  . Over the next 30 days, patient will work with LCSW to address concerns related to food insecurities  . Over the next 30 days, LCSW will explore new food resources for patient Interventions :  . Assessed patient's care coordination needs and discussed ongoing care management follow up  . LCSW F/U with [facility] [employer] [facility] reference food delivery program to patient's  home . Meals on Wheels referral made via [facility], patient is now on the wait list. Patient Self Care Activities & Deficits:  . Patient is able to contact friend to provide food if needed and is able to warm food  . Patient is unable to independently navigate community resource options without care coordination support . Acknowledges deficits related to resources and is glad to have support  . Limited support system Please see past updates related to this goal by clicking on the "Past Updates" button in the selected goal       Materials provided: Verbal education about food options provided by phone Ms. Funez received Care Management services today:  1. Care Management services include personalized support from designated clinical staff supervised by her physician, including individualized plan of care and coordination with other care providers 2. 24/7 contact [phone] for assistance for urgent and routine care needs. 3. Care Management are voluntary services and be declined at any time by calling the office.  Patient verbally agreed to assistance and services provided by embedded care coordination/care management team today.   Patient verbalizes understanding of instructions provided today.  Follow up plan:  SW will follow up with patient by phone over the next 3 to 4 weeks  Maurine Cane, LCSW

## 2019-12-27 NOTE — Chronic Care Management (AMB) (Signed)
Care Management   Clinical Social Work Follow Up   12/27/2019 Name: Kristen [name] MRN: [id number] DOB: 1941-09-24  Referred by: [name], MD  Reason for referral : Care Coordination (F/U for food resources)  Kristen [name] is a 79 y.o. year old female who is a primary care patient of [name], MD.  Reason for follow-up: Phone encounter with patient today for ongoing assessment and brief interventions to assist with care coordination needs.   Assessment: Patient is making progress towards goal.  States she has been able to give several things away and throw out old papers. Patient continues to go to PT once or twice a week and reports this has helped with her balance.  She also continue to have support from her female friend who visits her 3 to 5 times a week. No new needs identified today.  Review of patient status, including review of consultants reports, relevant laboratory and other test results, and collaboration with appropriate care team members and the patient's provider was performed as part of comprehensive patient evaluation and provision of care management services.   Advance Directive Status: N  Documents on file See Vyn[employer] application . SDOH (Social Determinants of Health) assessments performed: Yes SDOH Interventions     Most Recent Value  SDOH Interventions  SDOH Interventions for the Following Domains  Food Insecurity  Food Insecurity Interventions  Other (Comment), [facility] Referral [referral placed to [facility] Food[facility] delivery program]     Goals Addressed            This Visit's Progress   . Declutter House   On track    Current Barriers/ Progress:  . patient with CHF, HTN, and Anxiety needs support with down sizing and de clutter  . Patient continues to declutter her home, has been doing a little each day. Clinical Social Work Goal(s): Over the next 365 days patient will have house de cluttered as evidence of giving things away and finding a new home for  her items.  Clinical Interventions :  . Assessed patient's care coordination needs  . Assisted patient with setting goals to de clutter home . Solution focussed intervention and motivational interviewing also utilized  Patient Self Care Activities & Deficits:  . Patient is able to implement clinical interventions discussed today . Patient is able to work on small steps each day as discussed today . Acknowledges deficits and is motivated to resolve concern  Please see past updates related to this goal by clicking on the "Past Updates" button in the selected goal      . meals   On track    Current Barriers:  . Ongoing food need for patient with CHF, HTN , and Anxiety . Patient needs support, education, and care coordination to meet this need . Patient is unable to prepare meals , heating up purchased meals . Patient is able to prepare small items that does not requires her to stand for long periods of time Clinical Goal(s)  . Over the next 30 days, patient will work with LCSW to address concerns related to food insecurities  . Over the next 30 days, LCSW will explore new food resources for patient Interventions :  . Assessed patient's care coordination needs and discussed ongoing care management follow up  . LCSW F/U with [facility] [employer] [facility] food delivery program to patient's home . Meals on Wheels referral made via [facility], patient is now on the wait list. Patient Self Care Activities & Deficits:  .  Patient is able to contact friend to provide food if needed and is able to warm food  . Patient is unable to independently navigate community resource options without care coordination support . Acknowledges deficits related to resources and is glad to have support  . Limited support system Please see past updates related to this goal by clicking on the "Past Updates" button in the selected goal        Outpatient Encounter Medications as of 12/27/2019  Medication Sig  . diazepam  (VALIUM) 5 MG tablet TAKE 1/2 TABLET (2.5 MG TOTAL) BY MOUTH AT BEDTIME.  Marland Kitchen acetaminophen (TYLENOL) 650 MG CR tablet Take 650-1,300 mg by mouth every 8 (eight) hours as needed for pain.  Marland Kitchen alendronate (FOSAMAX) 10 MG tablet TAKE (1) TABLET BY MOUTH PER WEEK TAKE ON EMPTY STOMACH WITH FULL GLASS OF WATER (Patient taking differently: Take 10 mg by mouth every Sunday. TAKE ON EMPTY STOMACH WITH FULL GLASS OF WATER)  . allopurinol (ZYLOPRIM) 100 MG tablet TAKE 1 TABLET BY MOUTH EVERY DAY (Patient taking differently: Take 100 mg by mouth daily. )  . calcium carbonate (TUMS - DOSED IN MG ELEMENTAL CALCIUM) 500 MG chewable tablet Chew 1-2 tablets by mouth at bedtime.   Marland Kitchen CALCIUM PO Take 1 tablet by mouth 2 (two) times daily.  . Carboxymethylcellul-Glycerin (LUBRICATING EYE DROPS OP) Place 1 drop into both eyes daily as needed (dry eyes).  . cholecalciferol (VITAMIN D3) 25 MCG (1000 UT) tablet Take 1,000 Units by mouth daily.  . clindamycin (CLINDAGEL) 1 % gel APPLY TO AFFECTED AREA TWICE A DAY (Patient taking differently: Apply 1 application topically 2 (two) times daily as needed (wound care). )  . colchicine 0.6 MG tablet Take 1 tablet (0.6 mg total) by mouth 2 (two) times daily. Take twice daily until pain resolves and then take as needed for gout flare. (Patient taking differently: Take 0.6 mg by mouth 2 (two) times daily as needed (gout). )  . [employer] ANTI-FUNGAL 2 % powder APPLY TO AFFECTED AREA TWICE A DAY AFTER SKIN RESOLVES FROM KETOCONAZOLE CREAM (Patient taking differently: Apply 1 application topically daily as needed (fungus). )  . ELIQUIS 5 MG TABS tablet TAKE 1 TABLET BY MOUTH TWICE A DAY (Patient taking differently: Take 5 mg by mouth 2 (two) times daily. )  . Flax Oil-Fish Oil-Borage Oil (FISH-FLAX-BORAGE) CAPS Take 1 capsule by mouth every other day.   . furosemide (LASIX) 20 MG tablet Take 1 tablet (20 mg total) by mouth daily.  Marland Kitchen glucosamine-chondroitin (GLUCOSAMINE-CHONDROITIN DS) 500-400 MG  tablet Take 1 tablet by mouth daily.   Marland Kitchen HYDROcodone-acetaminophen (NORCO/VICODIN) 5-325 MG tablet Take 1 tablet by mouth every 6 (six) hours as needed for moderate pain.  Marland Kitchen lisinopril-hydrochlorothiazide (ZESTORETIC) 20-25 MG tablet Take 0.5 tablets by mouth daily. (Patient taking differently: Take 1 tablet by mouth daily. )  . loratadine (CLARITIN) 10 MG tablet Take 1 tablet (10 mg total) by mouth daily.  . metoprolol succinate (TOPROL-XL) 100 MG 24 hr tablet TAKE 1 TABLET (100 MG TOTAL) BY MOUTH DAILY. TAKE WITH OR IMMEDIATELY FOLLOWING A MEAL.  . Multiple Vitamins-Minerals (EMERGEN-C IMMUNE) PACK Take 1 packet by mouth daily.  . Multiple Vitamins-Minerals (MULTIVITAMIN WOMEN 50+) TABS Take 1 tablet by mouth daily.   . mupirocin ointment (BACTROBAN) 2 % APPLY TOPICALLY TO AFFECTED AREA TWICE DAILY (Patient taking differently: Apply 1 application topically 2 (two) times daily as needed (wound care). )  . Nutritional Supplements (ECHINACEA/GOLDEN SEAL PO) Take 1 tablet  by mouth daily.   . pantoprazole (PROTONIX) 40 MG tablet Take 1 tablet (40 mg total) by mouth daily.  . polycarbophil (FIBERCON) 625 MG tablet Take 625 mg by mouth daily.  . potassium chloride (KLOR-CON) 20 MEQ packet MIX 1 PACKET IN LIQUID AND TAKE BY MOUTH DAILY AS DIRECTED (Patient taking differently: Take 20 mEq by mouth daily. )  . Probiotic Product (PROBIOTIC-10 PO) Take 1 tablet by mouth daily.   Marland Kitchen Propylene Glycol (SYSTANE COMPLETE) 0.6 % SOLN Place 1 drop into both eyes daily.   No facility-administered encounter medications on file as of 12/27/2019.   Plan:  1. LCSW will continue to F/U with Food Delivery Program for patient 2. F.U check in call scheduled with patient in 3 weeks  Casimer Lanius, [place] / [place]   [phone] 11:25 AM

## 2019-12-30 ENCOUNTER — Other Ambulatory Visit: Payer: Self-pay | Admitting: Family Medicine

## 2019-12-30 DIAGNOSIS — H16223 Keratoconjunctivitis sicca, not specified as Sjogren's, bilateral: Secondary | ICD-10-CM | POA: Diagnosis not present

## 2019-12-31 DIAGNOSIS — M170 Bilateral primary osteoarthritis of knee: Secondary | ICD-10-CM | POA: Diagnosis not present

## 2019-12-31 DIAGNOSIS — M6281 Muscle weakness (generalized): Secondary | ICD-10-CM | POA: Diagnosis not present

## 2019-12-31 DIAGNOSIS — R262 Difficulty in walking, not elsewhere classified: Secondary | ICD-10-CM | POA: Diagnosis not present

## 2019-12-31 DIAGNOSIS — M25561 Pain in right knee: Secondary | ICD-10-CM | POA: Diagnosis not present

## 2020-01-07 DIAGNOSIS — R262 Difficulty in walking, not elsewhere classified: Secondary | ICD-10-CM | POA: Diagnosis not present

## 2020-01-07 DIAGNOSIS — M6281 Muscle weakness (generalized): Secondary | ICD-10-CM | POA: Diagnosis not present

## 2020-01-07 DIAGNOSIS — M170 Bilateral primary osteoarthritis of knee: Secondary | ICD-10-CM | POA: Diagnosis not present

## 2020-01-09 DIAGNOSIS — M170 Bilateral primary osteoarthritis of knee: Secondary | ICD-10-CM | POA: Diagnosis not present

## 2020-01-09 DIAGNOSIS — R262 Difficulty in walking, not elsewhere classified: Secondary | ICD-10-CM | POA: Diagnosis not present

## 2020-01-09 DIAGNOSIS — M6281 Muscle weakness (generalized): Secondary | ICD-10-CM | POA: Diagnosis not present

## 2020-01-09 DIAGNOSIS — M25561 Pain in right knee: Secondary | ICD-10-CM | POA: Diagnosis not present

## 2020-01-14 DIAGNOSIS — R262 Difficulty in walking, not elsewhere classified: Secondary | ICD-10-CM | POA: Diagnosis not present

## 2020-01-14 DIAGNOSIS — M25561 Pain in right knee: Secondary | ICD-10-CM | POA: Diagnosis not present

## 2020-01-14 DIAGNOSIS — M6281 Muscle weakness (generalized): Secondary | ICD-10-CM | POA: Diagnosis not present

## 2020-01-14 DIAGNOSIS — M170 Bilateral primary osteoarthritis of knee: Secondary | ICD-10-CM | POA: Diagnosis not present

## 2020-01-16 DIAGNOSIS — M25562 Pain in left knee: Secondary | ICD-10-CM | POA: Diagnosis not present

## 2020-01-16 DIAGNOSIS — M170 Bilateral primary osteoarthritis of knee: Secondary | ICD-10-CM | POA: Diagnosis not present

## 2020-01-16 DIAGNOSIS — R262 Difficulty in walking, not elsewhere classified: Secondary | ICD-10-CM | POA: Diagnosis not present

## 2020-01-16 DIAGNOSIS — M6281 Muscle weakness (generalized): Secondary | ICD-10-CM | POA: Diagnosis not present

## 2020-01-17 ENCOUNTER — Ambulatory Visit: Payer: Self-pay | Admitting: Licensed Clinical Social Worker

## 2020-01-17 ENCOUNTER — Telehealth: Payer: Medicare Other

## 2020-01-17 ENCOUNTER — Other Ambulatory Visit: Payer: Self-pay

## 2020-01-17 NOTE — Chronic Care Management (AMB) (Signed)
  Social Work Care Management  Unsuccessful Phone Outreach   01/17/2020 Name: VERITA KURODA MRN: [id number] DOB: April 16, 1941  Referred by: [name], MD,  Reason for referral : Care Coordination (F/U call)   Janiyha Montufar Montroy is a 79 y.o. year old female who sees [name], MD for primary care. F/U call with patient today to discuss progress on goals.  Telephone outreach was unsuccessful. A HIPPA compliant phone message was left for the patient providing contact information and requesting a return call.  Plan:  If no return call is received. LCSW will call again to F/U in 2 weeks  Casimer Lanius, [place] / [place]   [phone] 3:24 PM

## 2020-01-21 DIAGNOSIS — R262 Difficulty in walking, not elsewhere classified: Secondary | ICD-10-CM | POA: Diagnosis not present

## 2020-01-21 DIAGNOSIS — M25562 Pain in left knee: Secondary | ICD-10-CM | POA: Diagnosis not present

## 2020-01-21 DIAGNOSIS — M170 Bilateral primary osteoarthritis of knee: Secondary | ICD-10-CM | POA: Diagnosis not present

## 2020-01-21 DIAGNOSIS — M6281 Muscle weakness (generalized): Secondary | ICD-10-CM | POA: Diagnosis not present

## 2020-01-29 DIAGNOSIS — M25562 Pain in left knee: Secondary | ICD-10-CM | POA: Diagnosis not present

## 2020-01-29 DIAGNOSIS — M6281 Muscle weakness (generalized): Secondary | ICD-10-CM | POA: Diagnosis not present

## 2020-01-29 DIAGNOSIS — R262 Difficulty in walking, not elsewhere classified: Secondary | ICD-10-CM | POA: Diagnosis not present

## 2020-01-29 DIAGNOSIS — M170 Bilateral primary osteoarthritis of knee: Secondary | ICD-10-CM | POA: Diagnosis not present

## 2020-01-31 ENCOUNTER — Telehealth: Payer: Medicare Other

## 2020-02-05 ENCOUNTER — Ambulatory Visit: Payer: Medicare Other | Admitting: Licensed Clinical Social Worker

## 2020-02-05 ENCOUNTER — Other Ambulatory Visit: Payer: Self-pay

## 2020-02-05 DIAGNOSIS — Z139 Encounter for screening, unspecified: Secondary | ICD-10-CM

## 2020-02-05 DIAGNOSIS — Z7189 Other specified counseling: Secondary | ICD-10-CM

## 2020-02-05 NOTE — Chronic Care Management (AMB) (Signed)
Care Management   Clinical Social Work Follow Up   02/05/2020 Name: Kristen [name] MRN: [id number] DOB: [birth year]  Referred by: [name], MD  Reason for referral : Care Coordination (F/U on food resources)  Kristen [name] is a 79 y.o. year old female who is a primary care patient of [name], MD.  Reason for follow-up: Phone encounter with patient today for ongoing assessment and brief interventions to assist with care coordination needs.   Assessment:   Patient is making progress towards goal.  States she feels good about what she has accomplished with de cluttering her home and very appreciative of LCSW assisting to connect her with food resources .  Review of patient status, including review of consultants reports, relevant laboratory and other test results, and collaboration with appropriate care team members and the patient's provider was performed as part of comprehensive patient evaluation and provision of care management services.   Advance Directive Status: N See Vynca application for related entries. SDOH (Social Determinants of Health) assessments performed: No new needs identified.   Goals Addressed            This Visit's Progress   . Declutter House   On track    [place] (see longtitudinal plan of care for additional care plan information)  Current Barriers/ Progress:  . patient with CHF, HTN, and Anxiety needs support with down sizing and de clutter  . Patient continues to declutter her home, has been doing a little each day . Patient reports feeling better and able to get rid of her things continues to make progress with giving them away Clinical Social Work Goal(s): Over the next 365 days patient will have house de cluttered as evidence of giving things away and finding a new home for her items.  Clinical Interventions :  . Assessed patient's care coordination needs  . Assisted patient with setting goals to de clutter home . Solution  focussed intervention and motivational interviewing also utilized  Patient Self Care Activities & Deficits:  . Patient is able to implement clinical interventions discussed today . Patient is able to work on small steps each day as discussed today . Acknowledges deficits and is motivated to resolve concern  Please see past updates related to this goal by clicking on the "Past Updates" button in the selected goal     . COMPLETED: meals       CARE PLAN ENTRY (see longtitudinal plan of care for additional care plan information)  Current Barriers & progress:  . Ongoing food need for patient with CHF, HTN , and Anxiety . Patient needs support, education, and care coordination to meet this need . Patient is unable to prepare meals , heating up purchased meals . Patient is able to prepare small items that does not requires her to stand for long periods of time . Patient has been approved and is now receiving meals on wheels . Patient is also receiving food delivery from [employer] [employer] [facility] foodbox program Clinical Goal(s)  . Over the next 30 days, patient will work with LCSW to address concerns related to food insecurities  . Over the next 30 days, LCSW will explore new food resources for patient Interventions :  . Assessed patient's care coordination needs and discussed ongoing care management  . LCSW F/U with [facility] reference food delivery program to patient's home . Meals on Wheels referral made via [facility], patient is now on the wait list. Patient Self Care  Activities & Deficits:  . Patient is able to contact friend to provide food if needed and is able to warm food  . Patient is unable to independently navigate community resource options without care coordination support . Acknowledges deficits related to resources and is glad to have support  . Limited support system Please see past updates related to this goal by clicking on the "Past Updates" button in the selected goal         Outpatient Encounter Medications as of 02/05/2020  Medication Sig  . diazepam (VALIUM) 5 MG tablet TAKE 1/2 TABLET (2.5 MG TOTAL) BY MOUTH AT BEDTIME.  Marland Kitchen acetaminophen (TYLENOL) 650 MG CR tablet Take 650-1,300 mg by mouth every 8 (eight) hours as needed for pain.  Marland Kitchen alendronate (FOSAMAX) 10 MG tablet TAKE (1) TABLET BY MOUTH PER WEEK TAKE ON EMPTY STOMACH WITH FULL GLASS OF WATER (Patient taking differently: Take 10 mg by mouth every Sunday. TAKE ON EMPTY STOMACH WITH FULL GLASS OF WATER)  . allopurinol (ZYLOPRIM) 100 MG tablet TAKE 1 TABLET BY MOUTH EVERY DAY (Patient taking differently: Take 100 mg by mouth daily. )  . calcium carbonate (TUMS - DOSED IN MG ELEMENTAL CALCIUM) 500 MG chewable tablet Chew 1-2 tablets by mouth at bedtime.   Marland Kitchen CALCIUM PO Take 1 tablet by mouth 2 (two) times daily.  . Carboxymethylcellul-Glycerin (LUBRICATING EYE DROPS OP) Place 1 drop into both eyes daily as needed (dry eyes).  . cholecalciferol (VITAMIN D3) 25 MCG (1000 UT) tablet Take 1,000 Units by mouth daily.  . clindamycin (CLINDAGEL) 1 % gel APPLY TO AFFECTED AREA TWICE A DAY (Patient taking differently: Apply 1 application topically 2 (two) times daily as needed (wound care). )  . colchicine 0.6 MG tablet Take 1 tablet (0.6 mg total) by mouth 2 (two) times daily. Take twice daily until pain resolves and then take as needed for gout flare. (Patient taking differently: Take 0.6 mg by mouth 2 (two) times daily as needed (gout). )  . [employer] ANTI-FUNGAL 2 % powder APPLY TO AFFECTED AREA TWICE A DAY AFTER SKIN RESOLVES FROM KETOCONAZOLE CREAM (Patient taking differently: Apply 1 application topically daily as needed (fungus). )  . ELIQUIS 5 MG TABS tablet TAKE 1 TABLET BY MOUTH TWICE A DAY (Patient taking differently: Take 5 mg by mouth 2 (two) times daily. )  . Flax Oil-Fish Oil-Borage Oil (FISH-FLAX-BORAGE) CAPS Take 1 capsule by mouth every other day.   . furosemide (LASIX) 20 MG tablet Take 1 tablet (20 mg  total) by mouth daily.  Marland Kitchen glucosamine-chondroitin (GLUCOSAMINE-CHONDROITIN DS) 500-400 MG tablet Take 1 tablet by mouth daily.   Marland Kitchen HYDROcodone-acetaminophen (NORCO/VICODIN) 5-325 MG tablet Take 1 tablet by mouth every 6 (six) hours as needed for moderate pain.  Marland Kitchen lisinopril-hydrochlorothiazide (ZESTORETIC) 20-25 MG tablet Take 0.5 tablets by mouth daily. (Patient taking differently: Take 1 tablet by mouth daily. )  . loratadine (CLARITIN) 10 MG tablet TAKE 1 TABLET BY MOUTH EVERY DAY  . metoprolol succinate (TOPROL-XL) 100 MG 24 hr tablet TAKE 1 TABLET (100 MG TOTAL) BY MOUTH DAILY. TAKE WITH OR IMMEDIATELY FOLLOWING A MEAL.  . Multiple Vitamins-Minerals (EMERGEN-C IMMUNE) PACK Take 1 packet by mouth daily.  . Multiple Vitamins-Minerals (MULTIVITAMIN WOMEN 50+) TABS Take 1 tablet by mouth daily.   . mupirocin ointment (BACTROBAN) 2 % APPLY TOPICALLY TO AFFECTED AREA TWICE DAILY (Patient taking differently: Apply 1 application topically 2 (two) times daily as needed (wound care). )  . Nutritional Supplements (ECHINACEA/GOLDEN SEAL PO)  Take 1 tablet by mouth daily.   . pantoprazole (PROTONIX) 40 MG tablet Take 1 tablet (40 mg total) by mouth daily.  . polycarbophil (FIBERCON) 625 MG tablet Take 625 mg by mouth daily.  . potassium chloride (KLOR-CON) 20 MEQ packet MIX 1 PACKET IN LIQUID AND TAKE BY MOUTH DAILY AS DIRECTED (Patient taking differently: Take 20 mEq by mouth daily. )  . Probiotic Product (PROBIOTIC-10 PO) Take 1 tablet by mouth daily.   Marland Kitchen Propylene Glycol (SYSTANE COMPLETE) 0.6 % SOLN Place 1 drop into both eyes daily.     Plan: No follow up scheduled with LCSW.  Patient will contact office if other needs arise.  Casimer Lanius, LCSW Clinical Social Worker [place] / [place]   [phone] 1:59 PM

## 2020-02-05 NOTE — Patient Instructions (Signed)
Licensed Clinical Social Worker Visit Information Ms. Matuska  it was nice speaking with you. Please call me directly if you have questions [phone] Goals we discussed today:  Goals Addressed            This Visit's Progress   . Declutter House   On track    [place] (see longtitudinal plan of care for additional care plan information)  Current Barriers/ Progress:  . patient with CHF, HTN, and Anxiety needs support with down sizing and de clutter  . Patient continues to declutter her home, has been doing a little each day . Patient reports feeling better and able to get rid of her things continues to make progress with giving them away Clinical Social Work Goal(s): Over the next 365 days patient will have house de cluttered as evidence of giving things away and finding a new home for her items.  Clinical Interventions :  . Assessed patient's care coordination needs  . Assisted patient with setting goals to de clutter home . Solution focussed intervention and motivational interviewing also utilized  Patient Self Care Activities & Deficits:  . Patient is able to implement clinical interventions discussed today . Patient is able to work on small steps each day as discussed today . Acknowledges deficits and is motivated to resolve concern  Please see past updates related to this goal by clicking on the "Past Updates" button in the selected goal      . COMPLETED: meals       CARE PLAN ENTRY (see longtitudinal plan of care for additional care plan information)  Current Barriers & progress:  . Ongoing food need for patient with CHF, HTN , and Anxiety . Patient needs support, education, and care coordination to meet this need . Patient is unable to prepare meals , heating up purchased meals . Patient is able to prepare small items that does not requires her to stand for long periods of time . Patient has been approved and is now receiving meals on wheels . Patient is also  receiving food delivery from [employer] foodbox program Clinical Goal(s)  . Over the next 30 days, patient will work with LCSW to address concerns related to food insecurities  . Over the next 30 days, LCSW will explore new food resources for patient Interventions :  . Assessed patient's care coordination needs and discussed ongoing care management  . LCSW F/U with [facility] reference food delivery program to patient's home . Meals on Wheels referral made via [facility], patient is now on the wait list. Patient Self Care Activities & Deficits:  . Patient is able to contact friend to provide food if needed and is able to warm food  . Patient is unable to independently navigate community resource options without care coordination support . Acknowledges deficits related to resources and is glad to have support  . Limited support system Please see past updates related to this goal by clicking on the "Past Updates" button in the selected goal      Materials provided:  Ms. Riecke received Care Management services today:  1. Care Management services include personalized support from designated clinical staff supervised by her physician, including individualized plan of care and coordination with other care providers 2. 24/7 contact [phone] for assistance for urgent and routine care needs. 3. Care Management are voluntary services and be declined at any time by calling the office. Patient verbally agreed to assistance and services provided by embedded care  coordination/care management team today.   Patient verbalizes understanding of instructions provided today.  Follow up plan: No F/U scheduled.  Please contact the office if new needs arise  Maurine Cane, LCSW

## 2020-02-06 ENCOUNTER — Telehealth: Payer: Self-pay | Admitting: *Deleted

## 2020-02-06 MED ORDER — FUROSEMIDE 20 MG PO TABS: 20.0000 mg | ORAL_TABLET | Freq: Every day | ORAL | 0 refills | Status: DC

## 2020-02-06 NOTE — Telephone Encounter (Signed)
Rx refill sent to pharmacy. 

## 2020-02-10 ENCOUNTER — Other Ambulatory Visit: Payer: Self-pay

## 2020-02-10 DIAGNOSIS — F411 Generalized anxiety disorder: Secondary | ICD-10-CM

## 2020-02-10 MED ORDER — DIAZEPAM 5 MG PO TABS: ORAL_TABLET | ORAL | 0 refills | Status: DC

## 2020-02-19 ENCOUNTER — Other Ambulatory Visit: Payer: Self-pay | Admitting: Cardiology

## 2020-02-19 NOTE — Telephone Encounter (Signed)
LOV was 07/23/2019 with Dr.K and follow up appointment scheduled for 02/24/2020

## 2020-02-20 DIAGNOSIS — M170 Bilateral primary osteoarthritis of knee: Secondary | ICD-10-CM | POA: Diagnosis not present

## 2020-02-24 ENCOUNTER — Encounter: Payer: Self-pay | Admitting: Cardiology

## 2020-02-24 ENCOUNTER — Other Ambulatory Visit: Payer: Self-pay

## 2020-02-24 ENCOUNTER — Ambulatory Visit (INDEPENDENT_AMBULATORY_CARE_PROVIDER_SITE_OTHER): Payer: Medicare Other | Admitting: Cardiology

## 2020-02-24 VITALS — BP 100/68 | HR 80 | Ht 62.0 in | Wt 201.0 lb

## 2020-02-24 DIAGNOSIS — I4821 Permanent atrial fibrillation: Secondary | ICD-10-CM | POA: Diagnosis not present

## 2020-02-24 DIAGNOSIS — I5033 Acute on chronic diastolic (congestive) heart failure: Secondary | ICD-10-CM | POA: Diagnosis not present

## 2020-02-24 DIAGNOSIS — I110 Hypertensive heart disease with heart failure: Secondary | ICD-10-CM | POA: Diagnosis not present

## 2020-02-24 DIAGNOSIS — R932 Abnormal findings on diagnostic imaging of liver and biliary tract: Secondary | ICD-10-CM | POA: Diagnosis not present

## 2020-02-24 DIAGNOSIS — R1011 Right upper quadrant pain: Secondary | ICD-10-CM | POA: Diagnosis not present

## 2020-02-24 NOTE — Progress Notes (Signed)
Cardiology Office Note:    Date:  02/24/2020   ID:  Kristen [name], DOB 06/12/1941, MRN [id number]  PCP:  Kristen Dawn, MD  Cardiologist:  Kristen Campus, MD    Referring MD: Kristen Dawn, MD   Chief Complaint  Patient presents with  . Follow-up    6 Months  I am doing much better but I was very sick  History of Present Illness:    Kristen [name] is a 79 y.o. female with past medical history significant for permanent atrial fibrillation, chronic diastolic congestive heart failure, varicose of lower extremities.  Recently she ended being very sick.  She did have gallbladder surgery that was complicated by bile leak that required some drainage as well as apparently some significant flexion when it.  Like you to recover after that.  She spent some time in collapse nursing home.  Now she is back at home.  Denies having any chest pain tightness squeezing pressure burning chest.  But fatigue tiredness and swelling of lower extremities is concerning.  Past Medical History:  Diagnosis Date  . Abscess of abdominal cavity ([place]) 10/02/2019  . Anticoagulant long-term use    elquis -- managed by cardiology  . Anxiety   . Chronic calculous cholecystitis   . Chronic venous insufficiency    w/  varicose veins  . Depression   . Diastolic CHF, chronic ([place])    followed by cardiology  . Diverticulosis of colon   . Edema of both lower extremities    per pt mostly in summer time  . Essential hypertension, benign   . Fibrocystic breast disease   . Full dentures   . GERD (gastroesophageal reflux disease)    occasional tums and does not eat prior to bedtime  . Gout    08-19-2019  per pt last episode 07/ 2020  . Hiatal hernia   . History of diverticulitis 01/22/2016  . History of recurrent UTIs   . Hx of cholecystectomy 10/02/2019  . OA (osteoarthritis)    knees, lower back  . Permanent atrial fibrillation [facility]) cardiologist--- dr Kristen [name]   first dx 10/ 2011--- histroy DCCV  11-25-2010 by dr Kristen [name] (pt's previous cardiologist) and Cardiac cath 12-17-2010 no significant disease  . Scoliosis   . Stress incontinence in female     Past Surgical History:  Procedure Laterality Date  . BILIARY STENT PLACEMENT N/A 09/03/2019   Procedure: BILIARY STENT PLACEMENT;  Surgeon: Kristen Essex, MD;  Location: [facility];  Service: Endoscopy;  Laterality: N/A;  . BLEPHAROPLASTY Bilateral 02-22-2010  dr Kristen [name]   upper eyelid's  . BUNIONECTOMY Right 2003  . CATARACT EXTRACTION W/ INTRAOCULAR LENS  IMPLANT, BILATERAL  2018  . CHOLECYSTECTOMY N/A 08/22/2019   Procedure: LAPAROSCOPIC CHOLECYSTECTOMY;  Surgeon: [name], [name], MD;  Location: [facility];  Service: General;  Laterality: N/A;  . ERCP N/A 09/03/2019   Procedure: ENDOSCOPIC RETROGRADE CHOLANGIOPANCREATOGRAPHY (ERCP);  Surgeon: Kristen Essex, MD;  Location: [facility];  Service: Endoscopy;  Laterality: N/A;  . ESOPHAGOGASTRODUODENOSCOPY (EGD) WITH PROPOFOL N/A 12/06/2019   Procedure: ESOPHAGOGASTRODUODENOSCOPY (EGD) WITH PROPOFOL;  Surgeon: Kristen Essex, MD;  Location: [facility];  Service: Endoscopy;  Laterality: N/A;  stent removal, ERCP Scope, needs Flouro  . IR SINUS/FIST TUBE CHK-NON GI  09/10/2019  . IR SINUS/FIST TUBE CHK-NON GI  09/24/2019  . KNEE ARTHROSCOPY Left 2002  . REMOVAL OF STONES  09/03/2019   Procedure: REMOVAL OF STONES;  Surgeon: Kristen Essex, MD;  Location: [facility];  Service: Endoscopy;;  .  SPHINCTEROTOMY  09/03/2019   Procedure: SPHINCTEROTOMY;  Surgeon: Kristen Essex, MD;  Location: [facility];  Service: Endoscopy;;  . STENT REMOVAL  12/06/2019   Procedure: STENT REMOVAL;  Surgeon: Kristen Essex, MD;  Location: [facility];  Service: Endoscopy;;  . TUBAL LIGATION Bilateral 1980   AND RIGHT BREAST EXCISION CYST (BENIGN)    Current Medications: Current Meds  Medication Sig  . acetaminophen (TYLENOL) 650 MG CR tablet Take 650-1,300 mg by mouth every 8 (eight) hours as  needed for pain.  Marland Kitchen alendronate (FOSAMAX) 10 MG tablet TAKE (1) TABLET BY MOUTH PER WEEK TAKE ON EMPTY STOMACH WITH FULL GLASS OF WATER (Patient taking differently: Take 10 mg by mouth every Sunday. TAKE ON EMPTY STOMACH WITH FULL GLASS OF WATER)  . allopurinol (ZYLOPRIM) 100 MG tablet TAKE 1 TABLET BY MOUTH EVERY DAY (Patient taking differently: Take 100 mg by mouth daily. )  . calcium carbonate (TUMS - DOSED IN MG ELEMENTAL CALCIUM) 500 MG chewable tablet Chew 1-2 tablets by mouth at bedtime.   Marland Kitchen CALCIUM PO Take 1 tablet by mouth 2 (two) times daily.  . Carboxymethylcellul-Glycerin (LUBRICATING EYE DROPS OP) Place 1 drop into both eyes daily as needed (dry eyes).  . cholecalciferol (VITAMIN D3) 25 MCG (1000 UT) tablet Take 1,000 Units by mouth daily.  . clindamycin (CLINDAGEL) 1 % gel APPLY TO AFFECTED AREA TWICE A DAY (Patient taking differently: Apply 1 application topically 2 (two) times daily as needed (wound care). )  . colchicine 0.6 MG tablet Take 1 tablet (0.6 mg total) by mouth 2 (two) times daily. Take twice daily until pain resolves and then take as needed for gout flare. (Patient taking differently: Take 0.6 mg by mouth 2 (two) times daily as needed (gout). )  . [employer] ANTI-FUNGAL 2 % powder APPLY TO AFFECTED AREA TWICE A DAY AFTER SKIN RESOLVES FROM KETOCONAZOLE CREAM (Patient taking differently: Apply 1 application topically daily as needed (fungus). )  . diazepam (VALIUM) 5 MG tablet Take 1/2 tablet (2.5mg  total) by mouth at bedtime  . ELIQUIS 5 MG TABS tablet TAKE 1 TABLET BY MOUTH TWICE A DAY  . Flax Oil-Fish Oil-Borage Oil (FISH-FLAX-BORAGE) CAPS Take 1 capsule by mouth every other day.   . furosemide (LASIX) 20 MG tablet Take 1 tablet (20 mg total) by mouth daily.  Marland Kitchen glucosamine-chondroitin (GLUCOSAMINE-CHONDROITIN DS) 500-400 MG tablet Take 1 tablet by mouth daily.   Marland Kitchen HYDROcodone-acetaminophen (NORCO/VICODIN) 5-325 MG tablet Take 1 tablet by mouth every 6 (six) hours as needed for  moderate pain.  Marland Kitchen lisinopril-hydrochlorothiazide (ZESTORETIC) 20-25 MG tablet Take 0.5 tablets by mouth daily. (Patient taking differently: Take 1 tablet by mouth daily. )  . loratadine (CLARITIN) 10 MG tablet TAKE 1 TABLET BY MOUTH EVERY DAY  . metoprolol succinate (TOPROL-XL) 100 MG 24 hr tablet TAKE 1 TABLET (100 MG TOTAL) BY MOUTH DAILY. TAKE WITH OR IMMEDIATELY FOLLOWING A MEAL.  . Multiple Vitamins-Minerals (EMERGEN-C IMMUNE) PACK Take 1 packet by mouth daily.  . Multiple Vitamins-Minerals (MULTIVITAMIN WOMEN 50+) TABS Take 1 tablet by mouth daily.   . mupirocin ointment (BACTROBAN) 2 % APPLY TOPICALLY TO AFFECTED AREA TWICE DAILY (Patient taking differently: Apply 1 application topically 2 (two) times daily as needed (wound care). )  . Nutritional Supplements (ECHINACEA/GOLDEN SEAL PO) Take 1 tablet by mouth daily.   . pantoprazole (PROTONIX) 40 MG tablet Take 1 tablet (40 mg total) by mouth daily.  . polycarbophil (FIBERCON) 625 MG tablet Take 625 mg by mouth daily.  Marland Kitchen  potassium chloride (KLOR-CON) 20 MEQ packet MIX 1 PACKET IN LIQUID AND TAKE BY MOUTH DAILY AS DIRECTED (Patient taking differently: Take 20 mEq by mouth daily. )  . Probiotic Product (PROBIOTIC-10 PO) Take 1 tablet by mouth daily.   Marland Kitchen Propylene Glycol (SYSTANE COMPLETE) 0.6 % SOLN Place 1 drop into both eyes daily.  . RESTASIS 0.05 % ophthalmic emulsion 1 drop 2 (two) times daily.     Allergies:   Flecainide acetate   Social History   Socioeconomic History  . Marital status: Widowed    Spouse name: [name]  . Number of children: 1  . Years of education: Masters  . Highest education level: Master's degree (e.g., MA, MS, MEng, MEd, MSW, MBA)  Occupational History  . Occupation: Retired- Product manager: RETIRED  Tobacco Use  . Smoking status: Never Smoker  . Smokeless tobacco: Never Used  Substance and Sexual Activity  . Alcohol use: Yes    Comment: rarely  . Drug use: Never  . Sexual activity: Not  Currently    Birth control/protection: Post-menopausal  Other Topics Concern  . Not on file  Social History Narrative   Emergency Contact: Leta Baptist [phone]   Who lives with you: self   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 Exercise: Pt dances regularly for shows   Seatbelts: Pt reports wearing seatbelt when in vehicles.    Sun Exposure/Protection: Pt reports not being in the sun very much   Hobbies: dancing, painting,    Working smoke alarm: yes   Home throw rugs: yes   Home free from clutter: yes   ______________________________________________________________________________________   Current Social History     Who lives at home: lives alone, retired Education officer, museum and librarian  10/25/2019    Transportation: provided by [facility] Medicare and her female friend 10/25/2019   Important Relationships & Pets: has cats and a friend that she talks to or sees daily.  Reports no family local 10/25/2019    Current Stressors: clutter in her home 10/25/2019   Other: Unable to prepare meals and needs help with keeping her home clean.  Describes house as being cluttered with narrow paths in her trailer.  No cental heat uses space heaters for the past year. Leaking kitchen causes water bill to be higher than it should be 10/25/2019   Casimer Lanius, LCSW   Clinical Social Worker                                                                                                         Social Determinants of Health   Financial Resource Strain:   . Difficulty of Paying Living Expenses:   Food Insecurity: Food Insecurity Present  . Worried About Charity fundraiser in the Last Year: Sometimes true  . Ran Out of Food in the Last Year: Sometimes true  Transportation Needs: No Transportation Needs  . Lack of Transportation (Medical): No  . Lack of Transportation (  Non-Medical): No  Physical Activity:   . Days of Exercise  per Week:   . Minutes of Exercise per Session:   Stress:   . Feeling of Stress :   Social Connections:   . Frequency of Communication with Friends and Family:   . Frequency of Social Gatherings with Friends and Family:   . Attends Religious Services:   . Active Member of Clubs or Organizations:   . Attends Archivist Meetings:   Marland Kitchen Marital Status:      Family History: The patient's family history includes Alcohol abuse in her father; Cancer in her maternal aunt and mother; Heart disease in her maternal uncle; Kidney disease in her father. ROS:   Please see the history of present illness.    All 14 point review of systems negative except as described per history of present illness  EKGs/Labs/Other Studies Reviewed:      Recent Labs: 08/28/2019: B Natriuretic Peptide 310.5 09/16/2019: ALT 16; BUN 21; Creatinine, Ser 0.90; Hemoglobin 10.4; Magnesium 2.3; Platelets 617; Potassium 3.7; Sodium 136  Recent Lipid Panel    Component Value Date/Time   CHOL 224 (H) 06/09/2014 1427   TRIG 142 06/09/2014 1427   HDL 64 06/09/2014 1427   CHOLHDL 3.5 06/09/2014 1427   VLDL 28 06/09/2014 1427   LDLCALC 132 (H) 06/09/2014 1427    Physical Exam:    VS:  BP 100/68   Pulse 80   Ht 5\' 2"  (1.575 m)   Wt 201 lb (91.2 kg)   SpO2 93%   BMI 36.76 kg/m     Wt Readings from Last 3 Encounters:  02/24/20 201 lb (91.2 kg)  12/06/19 190 lb (86.2 kg)  09/17/19 191 lb 12.8 oz (87 kg)     GEN:  Well nourished, well developed in no acute distress HEENT: Normal NECK: No JVD; No carotid bruits LYMPHATICS: No lymphadenopathy CARDIAC: Irregularly irregular, no murmurs, no rubs, no gallops RESPIRATORY:  Clear to auscultation without rales, wheezing or rhonchi  ABDOMEN: Soft, non-tender, non-distended MUSCULOSKELETAL:  No edema; No deformity  SKIN: Warm and dry LOWER EXTREMITIES: no swelling NEUROLOGIC:  Alert and oriented x 3 PSYCHIATRIC:  Normal affect   ASSESSMENT:    1. Permanent  atrial fibrillation ([place])   2. Hypertensive heart disease with acute on chronic diastolic congestive heart failure ([place])   3. Acute on chronic diastolic CHF (congestive heart failure) ([place])    PLAN:    In order of problems listed above:  1. Permanent atrial fibrillation, anticoagulated with Eliquis which I will continue, rate is controlled.  We will continue present management.  I will ask her to have an echocardiogram to assess left ventricle ejection fraction. 2. Essential hypertension.  Blood pressure appears to be well controlled today.  I will continue present management. 3. Diastolic congestive heart failure again echocardiogram will be done to assess that. 4. I will also check complete metabolic panel as well as a proBNP to help with the management of her diastolic congestive heart failure.  If Chem-7 and proBNP is showing indication of congestive heart failure then will have to intensify diuresis.   Medication Adjustments/Labs and Tests Ordered: Current medicines are reviewed at length with the patient today.  Concerns regarding medicines are outlined above.  No orders of the defined types were placed in this encounter.  Medication changes: No orders of the defined types were placed in this encounter.   Signed, Park Liter, MD, [facility] 02/24/2020 3:07 PM    [facility]

## 2020-02-24 NOTE — Addendum Note (Signed)
Addended by: Stephani Police on: 02/24/2020 03:14 PM   Modules accepted: Orders

## 2020-02-24 NOTE — Patient Instructions (Signed)
Medication Instructions:  Your physician recommends that you continue on your current medications as directed. Please refer to the Current Medication list given to you today.  *If you need a refill on your cardiac medications before your next appointment, please call your pharmacy*   Lab Work: Your physician recommends that you return for lab work in: today (pro-BNP)   If you have labs (blood work) drawn today and your tests are completely normal, you will receive your results only by: Marland Kitchen MyChart Message (if you have MyChart) OR . A paper copy in the mail If you have any lab test that is abnormal or we need to change your treatment, we will call you to review the results.   Testing/Procedures: Your physician has requested that you have an echocardiogram. Echocardiography is a painless test that uses sound waves to create images of your heart. It provides your doctor with information about the size and shape of your heart and how well your heart's chambers and valves are working. This procedure takes approximately one hour. There are no restrictions for this procedure.    Follow-Up: At [facility], you and your health needs are our priority.  As part of our continuing mission to provide you with exceptional heart care, we have created designated Provider Care Teams.  These Care Teams include your primary Cardiologist (physician) and Advanced Practice Providers (APPs -  Physician Assistants and Nurse Practitioners) who all work together to provide you with the care you need, when you need it.  We recommend signing up for the patient portal called "MyChart".  Sign up information is provided on this After Visit Summary.  MyChart is used to connect with patients for Virtual Visits (Telemedicine).  Patients are able to view lab/test results, encounter notes, upcoming appointments, etc.  Non-urgent messages can be sent to your provider as well.   To learn more about what you can do with MyChart,  go to [email or web address].    Your next appointment:   3 month(s)  The format for your next appointment:   In Person  Provider:   Jenne Campus, MD   Other Instructions

## 2020-02-25 LAB — PRO B NATRIURETIC PEPTIDE: NT-Pro BNP: 619 pg/mL (ref 0–738)

## 2020-02-28 ENCOUNTER — Ambulatory Visit (HOSPITAL_BASED_OUTPATIENT_CLINIC_OR_DEPARTMENT_OTHER)
Admission: RE | Admit: 2020-02-28 | Discharge: 2020-02-28 | Disposition: A | Discharge status: Home / Self Care | Payer: Medicare Other | Source: Ambulatory Visit | Attending: Cardiology | Admitting: Cardiology

## 2020-02-28 ENCOUNTER — Other Ambulatory Visit: Payer: Self-pay

## 2020-02-28 DIAGNOSIS — I4821 Permanent atrial fibrillation: Secondary | ICD-10-CM

## 2020-02-28 DIAGNOSIS — I5033 Acute on chronic diastolic (congestive) heart failure: Secondary | ICD-10-CM

## 2020-02-28 DIAGNOSIS — I110 Hypertensive heart disease with heart failure: Secondary | ICD-10-CM | POA: Diagnosis not present

## 2020-02-28 NOTE — Progress Notes (Signed)
  Echocardiogram 2D Echocardiogram has been performed.  Kristen [name] 02/28/2020, 2:41 PM

## 2020-03-04 DIAGNOSIS — N3020 Other chronic cystitis without hematuria: Secondary | ICD-10-CM | POA: Diagnosis not present

## 2020-03-11 ENCOUNTER — Other Ambulatory Visit: Payer: Self-pay | Admitting: *Deleted

## 2020-03-11 DIAGNOSIS — I5032 Chronic diastolic (congestive) heart failure: Secondary | ICD-10-CM

## 2020-03-12 MED ORDER — POTASSIUM CHLORIDE 20 MEQ PO PACK: 20.0000 meq | PACK | Freq: Every day | ORAL | 0 refills | Status: DC

## 2020-03-17 ENCOUNTER — Other Ambulatory Visit: Payer: Self-pay

## 2020-03-17 ENCOUNTER — Ambulatory Visit (INDEPENDENT_AMBULATORY_CARE_PROVIDER_SITE_OTHER): Payer: Medicare Other | Admitting: Family Medicine

## 2020-03-17 VITALS — BP 124/82 | HR 98 | Wt 203.0 lb

## 2020-03-17 DIAGNOSIS — E039 Hypothyroidism, unspecified: Secondary | ICD-10-CM

## 2020-03-17 DIAGNOSIS — L609 Nail disorder, unspecified: Secondary | ICD-10-CM | POA: Diagnosis not present

## 2020-03-17 DIAGNOSIS — E878 Other disorders of electrolyte and fluid balance, not elsewhere classified: Secondary | ICD-10-CM

## 2020-03-17 DIAGNOSIS — R5383 Other fatigue: Secondary | ICD-10-CM | POA: Insufficient documentation

## 2020-03-17 DIAGNOSIS — D649 Anemia, unspecified: Secondary | ICD-10-CM | POA: Diagnosis not present

## 2020-03-17 DIAGNOSIS — E569 Vitamin deficiency, unspecified: Secondary | ICD-10-CM | POA: Diagnosis not present

## 2020-03-17 NOTE — Assessment & Plan Note (Signed)
Wide differential at this point. I suspect most of her symptoms and changes are related to a prolonged hospital stay and deconditioning related. However, need to rule out reversible organic causes - CBC, Iron, Ferritin, and TIBC to rule out anemia, Vit B12 and Folate as well - TSH as her symptoms are somewhat concerning for thyroid disorder that can be caused by prolonged sickness (although she is recovered) - BMP as she is having cramping like pain and taking potassium so checking for electrolyte abnormalities

## 2020-03-17 NOTE — Progress Notes (Signed)
    SUBJECTIVE:   CHIEF COMPLAINT / HPI:   Fatigue with multiple associated symptoms Patient is a 79y/o female who presents today as she has "not been in for a long time". She states recently has had gallbladder surgery that was "botched" and she ended up having to have a tube placed on the outside of her body to let it drain. While she was in the hospital she developed PNA and became very sick. She was on multiple antibiotics but overall her condition has improved.   Today, she reports since her hospital stay she has had fatigue, hair loss, brittle finger nails, cold intolerance, and some swelling in her legs. She denies chest pain, SOB, difficulty breathing, abdominal pain, nausea, vomiting, or dysuria. She is also endorsing what she describes as "muscle cramps" where she states her legs hurt and feel tight at random times and she gets the same feeling in her hands. She states her hands get so tight they "lock up" and she is unable to move them without using her other hand.  PERTINENT  PMH / PSH: CHF, HTN, A-Fib, OA  OBJECTIVE:   BP 124/82   Pulse 98   Wt 203 lb (92.1 kg)   SpO2 97%   BMI 37.13 kg/m   Gen: NAD Eyes: No conjunctival pallor Cardio: irregularly irregular rhythm, no murmurs Resp: CTAB, no wheezing, no crackles Extremities: fingernails appear brittle bilaterally, trace edema bilaterally up to the ankles, no calf pain or tenderness  ASSESSMENT/PLAN:   Fatigue Wide differential at this point. I suspect most of her symptoms and changes are related to a prolonged hospital stay and deconditioning related. However, need to rule out reversible organic causes - CBC, Iron, Ferritin, and TIBC to rule out anemia, Vit B12 and Folate as well - TSH as her symptoms are somewhat concerning for thyroid disorder that can be caused by prolonged sickness (although she is recovered) - BMP as she is having cramping like pain and taking potassium so checking for electrolyte abnormalities     Nuala Alpha, DO [place]

## 2020-03-17 NOTE — Patient Instructions (Signed)
It was great to meet you today! Thank you for letting me participate in your care!  Today, we discussed your recent hospitalization and I am glad you are making a recovery. I am sorry to hear your health has been a struggle since your hospital stay. Due to your multiple symptoms that are not specific I am ordering some labs to check for any reversible causes of your symptoms. If anything is abnormal I will call you.  Be well, Harolyn Rutherford, DO PGY-3, [facility]

## 2020-03-18 ENCOUNTER — Encounter: Payer: Self-pay | Admitting: Family Medicine

## 2020-03-18 LAB — BASIC METABOLIC PANEL
BUN/Creatinine Ratio: 23 (ref 12–28)
BUN: 30 mg/dL — ABNORMAL HIGH (ref 8–27)
CO2: 27 mmol/L (ref 20–29)
Calcium: 9.5 mg/dL (ref 8.7–10.3)
Chloride: 98 mmol/L (ref 96–106)
Creatinine, Ser: 1.29 mg/dL — ABNORMAL HIGH (ref 0.57–1.00)
GFR calc Af Amer: 46 mL/min/{1.73_m2} — ABNORMAL LOW (ref >59)
GFR calc non Af Amer: 39 mL/min/{1.73_m2} — ABNORMAL LOW (ref >59)
Glucose: 93 mg/dL (ref 65–99)
Potassium: 4.4 mmol/L (ref 3.5–5.2)
Sodium: 140 mmol/L (ref 134–144)

## 2020-03-18 LAB — FERRITIN: Ferritin: 69 ng/mL (ref 15–150)

## 2020-03-18 LAB — CBC
Hematocrit: 38.4 % (ref 34.0–46.6)
Hemoglobin: 12.3 g/dL (ref 11.1–15.9)
MCH: 29.2 pg (ref 26.6–33.0)
MCHC: 32 g/dL (ref 31.5–35.7)
MCV: 91 fL (ref 79–97)
Platelets: 255 10*3/uL (ref 150–450)
RBC: 4.21 x10E6/uL (ref 3.77–5.28)
RDW: 14.4 % (ref 11.7–15.4)
WBC: 8.6 10*3/uL (ref 3.4–10.8)

## 2020-03-18 LAB — FOLATE: Folate: >20 ng/mL (ref >3.0)

## 2020-03-18 LAB — VITAMIN D 25 HYDROXY (VIT D DEFICIENCY, FRACTURES): Vit D, 25-Hydroxy: 36.4 ng/mL (ref 30.0–100.0)

## 2020-03-18 LAB — VITAMIN B12: Vitamin B-12: 601 pg/mL (ref 232–1245)

## 2020-03-18 LAB — TSH: TSH: 3.06 u[IU]/mL (ref 0.450–4.500)

## 2020-03-18 NOTE — Progress Notes (Signed)
Patient called and left results over telephone. Sending letter with results. Asked patient to drink plenty of water as labs show some evidence of dehydration. Repeat in 1-2 weeks.

## 2020-03-20 ENCOUNTER — Other Ambulatory Visit: Payer: Self-pay

## 2020-03-20 ENCOUNTER — Telehealth: Payer: Self-pay

## 2020-03-20 DIAGNOSIS — N179 Acute kidney failure, unspecified: Secondary | ICD-10-CM

## 2020-03-20 NOTE — Telephone Encounter (Signed)
Patient calls nurse line requesting lab results from 5/11. I informed patient everything looked great, however she needs a repeat BMP. Advised patient to start drinking plenty of water regularly and to come back for a lab visit in 1-2 weeks. Patient agrees with plan and admits to not drinking water regularly. Patient scheduled for 5/21. Future order placed.

## 2020-03-25 ENCOUNTER — Telehealth: Payer: Self-pay | Admitting: *Deleted

## 2020-03-25 NOTE — Telephone Encounter (Signed)
Left message to return call. If patient returns call, please have her reschedule her lab appointment to another time. She is scheduled to come in this Friday afternoon but we will be closed. Jaymes Graff Busick

## 2020-03-27 ENCOUNTER — Other Ambulatory Visit: Payer: Medicare Other

## 2020-04-07 ENCOUNTER — Other Ambulatory Visit: Payer: Medicare Other

## 2020-04-07 ENCOUNTER — Other Ambulatory Visit: Payer: Self-pay

## 2020-04-07 DIAGNOSIS — N179 Acute kidney failure, unspecified: Secondary | ICD-10-CM

## 2020-04-08 ENCOUNTER — Ambulatory Visit (INDEPENDENT_AMBULATORY_CARE_PROVIDER_SITE_OTHER): Payer: Medicare Other | Admitting: Family Medicine

## 2020-04-08 ENCOUNTER — Other Ambulatory Visit: Payer: Self-pay

## 2020-04-08 DIAGNOSIS — B078 Other viral warts: Secondary | ICD-10-CM | POA: Diagnosis not present

## 2020-04-08 DIAGNOSIS — L918 Other hypertrophic disorders of the skin: Secondary | ICD-10-CM | POA: Diagnosis not present

## 2020-04-08 DIAGNOSIS — B372 Candidiasis of skin and nail: Secondary | ICD-10-CM

## 2020-04-08 LAB — BASIC METABOLIC PANEL
BUN/Creatinine Ratio: 28 (ref 12–28)
BUN: 37 mg/dL — ABNORMAL HIGH (ref 8–27)
CO2: 25 mmol/L (ref 20–29)
Calcium: 9.5 mg/dL (ref 8.7–10.3)
Chloride: 97 mmol/L (ref 96–106)
Creatinine, Ser: 1.31 mg/dL — ABNORMAL HIGH (ref 0.57–1.00)
GFR calc Af Amer: 45 mL/min/{1.73_m2} — ABNORMAL LOW (ref >59)
GFR calc non Af Amer: 39 mL/min/{1.73_m2} — ABNORMAL LOW (ref >59)
Glucose: 90 mg/dL (ref 65–99)
Potassium: 4.5 mmol/L (ref 3.5–5.2)
Sodium: 139 mmol/L (ref 134–144)

## 2020-04-08 MED ORDER — NYSTATIN 100000 UNIT/GM EX POWD: 1.0000 "application " | Freq: Three times a day (TID) | CUTANEOUS | 0 refills | Status: DC

## 2020-04-08 NOTE — Assessment & Plan Note (Signed)
Area of erythema under breasts likely from Candida.  Advised nystatin powder.  Erythematous nodules likely irritated from her breast and possible friction.  Advised to keep area covered with gauze to avoid friction.  Follow-up if no improvement.

## 2020-04-08 NOTE — Progress Notes (Signed)
    SUBJECTIVE:   CHIEF COMPLAINT / HPI:   Bumps all over body  Patient reports she has bumps that pop up all over her body. States they are not healing well. Bumps are under her breasts bilaterally and go towards her back. She does have increased sweating in that area. Used to use antifungal ointment. Sees a dermatologist. Also notes "tiny bumps" under her necks. No fevers.   PERTINENT  PMH / PSH: varicose veins, skin lesion, anticoagulant use, anxiety   OBJECTIVE:   BP 125/80   Pulse 83   Wt 206 lb 6.4 oz (93.6 kg)   SpO2 97%   BMI 37.75 kg/m   Skin: erythematous nodule under right breast, 3 erythematous nodules under left breast. Skin tag in low back. Flat warts noted on neck and chest. Erythema under breasts  Chaperone present  ASSESSMENT/PLAN:   Candidal intertrigo Area of erythema under breasts likely from Candida.  Advised nystatin powder.  Erythematous nodules likely irritated from her breast and possible friction.  Advised to keep area covered with gauze to avoid friction.  Follow-up if no improvement.  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  Dr. Josiah Lobo was able to view these in the room.  Patient would like these removed.  Have discussed with patient to schedule appointment in dermatology clinic.  Patient will plan to schedule this on her way out.  Skin tag Patient with skin tag on low back.  Patient would like this removed.  Will follow up in dermatology clinic for removal.     Kristen [name], [place]

## 2020-04-08 NOTE — Assessment & Plan Note (Signed)
Patient with multiple flat warts on her chest and her neck.  Dr. Josiah Lobo was able to view these in the room.  Patient would like these removed.  Have discussed with patient to schedule appointment in dermatology clinic.  Patient will plan to schedule this on her way out.

## 2020-04-08 NOTE — Assessment & Plan Note (Signed)
Patient with skin tag on low back.  Patient would like this removed.  Will follow up in dermatology clinic for removal.

## 2020-04-08 NOTE — Patient Instructions (Signed)
1. Use nystatin powder under your breasts 2. Follow up in dermatology clinic for your flat warts

## 2020-04-17 ENCOUNTER — Telehealth: Payer: Self-pay

## 2020-04-17 NOTE — Telephone Encounter (Signed)
Patient calls nurse line requesting recent BMP results. Please advise. I am happy to relay any message.

## 2020-04-20 NOTE — Telephone Encounter (Signed)
I am unsure why exactly this was ordered because it was done during a labs only visit and I have not had an encounter with the patient recently. It could have been follow up from Dr. Arlana Pouch visit in [place]. Regardless her kidney function looks almost exactly the same as the bmp in mid-may. I am not sure if this represents continued acute kidney injury or is her new baseline as her cr of 1.3 is  elevated from bmp 7 months ago (baseline 0.9).  I would like for the patient to follow up with me in clinic, as I would like to review her medications. There are some medications on her list which could be causing/contributing to her decreased kidney function.  [name] MD PGY-3 Family Medicine Resident

## 2020-04-23 ENCOUNTER — Other Ambulatory Visit: Payer: Self-pay

## 2020-04-23 DIAGNOSIS — I5032 Chronic diastolic (congestive) heart failure: Secondary | ICD-10-CM

## 2020-04-23 MED ORDER — LISINOPRIL-HYDROCHLOROTHIAZIDE 20-25 MG PO TABS: 1.0000 | ORAL_TABLET | Freq: Every day | ORAL | 2 refills | Status: DC

## 2020-04-23 MED ORDER — POTASSIUM CHLORIDE 20 MEQ PO PACK: 20.0000 meq | PACK | Freq: Every day | ORAL | 0 refills | Status: DC

## 2020-04-30 ENCOUNTER — Ambulatory Visit (INDEPENDENT_AMBULATORY_CARE_PROVIDER_SITE_OTHER): Payer: Medicare Other | Admitting: Family Medicine

## 2020-04-30 ENCOUNTER — Other Ambulatory Visit: Payer: Self-pay | Admitting: Cardiology

## 2020-04-30 ENCOUNTER — Encounter: Payer: Self-pay | Admitting: Family Medicine

## 2020-04-30 ENCOUNTER — Other Ambulatory Visit: Payer: Self-pay

## 2020-04-30 VITALS — BP 104/60 | HR 98 | Wt 204.4 lb

## 2020-04-30 VITALS — BP 125/80 | HR 68 | Ht 62.0 in | Wt 204.6 lb

## 2020-04-30 DIAGNOSIS — R252 Cramp and spasm: Secondary | ICD-10-CM | POA: Diagnosis not present

## 2020-04-30 DIAGNOSIS — M62838 Other muscle spasm: Secondary | ICD-10-CM

## 2020-04-30 DIAGNOSIS — L821 Other seborrheic keratosis: Secondary | ICD-10-CM

## 2020-04-30 DIAGNOSIS — L918 Other hypertrophic disorders of the skin: Secondary | ICD-10-CM

## 2020-04-30 DIAGNOSIS — R7989 Other specified abnormal findings of blood chemistry: Secondary | ICD-10-CM | POA: Diagnosis not present

## 2020-04-30 MED ORDER — RESTASIS 0.05 % OP EMUL: 1.0000 [drp] | Freq: Two times a day (BID) | OPHTHALMIC | 0 refills | Status: DC

## 2020-04-30 MED ORDER — LISINOPRIL 20 MG PO TABS
20.0000 mg | ORAL_TABLET | Freq: Every day | ORAL | 0 refills | Status: DC
Start: 2020-04-30 — End: 2020-07-05

## 2020-04-30 MED ORDER — NYSTATIN 100000 UNIT/GM EX POWD
1.0000 | Freq: Two times a day (BID) | CUTANEOUS | 0 refills | Status: DC
Start: 2020-04-30 — End: 2023-06-02

## 2020-04-30 NOTE — Progress Notes (Signed)
    SUBJECTIVE:   CHIEF COMPLAINT / HPI:   Skin lesions Patient notes that she has had various small skin lesions on her back, legs, abdomen, and especially along her bra line.  She has had no bleeding or rapidly enlarging lesions she notes that they do make her skin feel rough, which she does not like, but they are not particularly bothersome.  She has had some of them frozen off in the past and tolerated this well.  PERTINENT  PMH / PSH: Skin tag, anxiety, wart  OBJECTIVE:   BP 125/80   Pulse 68   Ht 5\' 2"  (1.575 m)   Wt 204 lb 9.6 oz (92.8 kg)   SpO2 98%   BMI 37.42 kg/m   General: well appearing, appears stated age Skin: Several skin tags and seborrheic keratoses scattered on different areas of her skin without bleeding or signs of irritation.  Flat wart on left thigh.  Procedure: Excision of Benign Skin Lesion Procedure Note   PRE-OP DIAGNOSIS: skin tag on breast and neck POST-OP DIAGNOSIS: Same PROCEDURE: skin lesion excision Performing Physician: Kathrene Alu, MD  PROCEDURE: _ Shave Biopsy  x Scissors    _ Cryotherapy    _ Punch (Size _)  The area surrounding the skin lesion was prepared and draped in the usual sterile manner. The lesion was removed in the usual manner by the biopsy method noted above. Hemostasis was assured.  Closure:   _ Monsel's for hemostasis       _ suture _          x None  Followup: The patient tolerated the procedure well without complications. Standard post-procedure care is explained and return precautions are given.  ASSESSMENT/PLAN:   Seborrheic keratoses Patient has a small number of seborrheic keratoses on different areas of her body.  None of these look inflamed.  Counseled patient that removing these could cause scarring and irritation, and they are benign if left alone.  Skin tag Patient has several skin tags along bra line and on her back and neck.  None of these appear inflamed.   Counseled patient that removing these as not medically necessary, but we are happy to do so if she prefers.  She would like the skin tag at the base of her neck and on her right breast to be removed since they sometimes irritate her.  These were removed without issue today, see procedure note.  No signs of precancerous or cancerous lesions.     Kathrene Alu, MD [place]

## 2020-04-30 NOTE — Patient Instructions (Addendum)
It was great seeing you again today!  Think that your kidney function is little worse because you are taking both Lasix and hydrochlorothiazide.  I stopped her combination pill, and continued lisinopril from the combination pill  For your hand and leg numbness and tingling, you do not have severe arterial disease.  I think checking a phosphorus and magnesium is a good next step.  I refilled your eyedrops.

## 2020-04-30 NOTE — Assessment & Plan Note (Signed)
Patient has a small number of seborrheic keratoses on different areas of her body.  None of these look inflamed.  Counseled patient that removing these could cause scarring and irritation, and they are benign if left alone.

## 2020-04-30 NOTE — Addendum Note (Signed)
Addended by: Andrena Mews T on: 04/30/2020 07:36 PM   Modules accepted: Orders

## 2020-04-30 NOTE — Patient Instructions (Signed)
It was nice seeing you today Kristen [name]. We took off the skin tag on your right breast. Please keep the area clear and dry. It should heal pretty nicely in the next few days. Please call, if there is any redness, pain or discharges around the area. Follow-up soon should you decide on taking off your other skin tags as well as the wart on your left thigh.    Wound Care, Adult Taking care of your wound properly can help to prevent pain, infection, and scarring. It can also help your wound to heal more quickly. How to care for your wound Wound care      Follow instructions from your health care provider about how to take care of your wound. Make sure you: ? Wash your hands with soap and water before you change the bandage (dressing). If soap and water are not available, use hand sanitizer. ? Change your dressing as told by your health care provider. ? Leave stitches (sutures), skin glue, or adhesive strips in place. These skin closures may need to stay in place for 2 weeks or longer. If adhesive strip edges start to loosen and curl up, you may trim the loose edges. Do not remove adhesive strips completely unless your health care provider tells you to do that.  Check your wound area every day for signs of infection. Check for: ? Redness, swelling, or pain. ? Fluid or blood. ? Warmth. ? Pus or a bad smell.  Ask your health care provider if you should clean the wound with mild soap and water. Doing this may include: ? Using a clean towel to pat the wound dry after cleaning it. Do not rub or scrub the wound. ? Applying a cream or ointment. Do this only as told by your health care provider. ? Covering the incision with a clean dressing.  Ask your health care provider when you can leave the wound uncovered.  Keep the dressing dry until your health care provider says it can be removed. Do not take baths, swim, use a hot tub, or do anything that would put the wound underwater until your health care  provider approves. Ask your health care provider if you can take showers. You may only be allowed to take sponge baths. Medicines   If you were prescribed an antibiotic medicine, cream, or ointment, take or use the antibiotic as told by your health care provider. Do not stop taking or using the antibiotic even if your condition improves.  Take over-the-counter and prescription medicines only as told by your health care provider. If you were prescribed pain medicine, take it 30 or more minutes before you do any wound care or as told by your health care provider. General instructions  Return to your normal activities as told by your health care provider. Ask your health care provider what activities are safe.  Do not scratch or pick at the wound.  Do not use any products that contain nicotine or tobacco, such as cigarettes and e-cigarettes. These may delay wound healing. If you need help quitting, ask your health care provider.  Keep all follow-up visits as told by your health care provider. This is important.  Eat a diet that includes protein, vitamin A, vitamin C, and other nutrient-rich foods to help the wound heal. ? Foods rich in protein include meat, dairy, beans, nuts, and other sources. ? Foods rich in vitamin A include carrots and dark green, leafy vegetables. ? Foods rich in vitamin C include citrus, tomatoes,  and other fruits and vegetables. ? Nutrient-rich foods have protein, carbohydrates, fat, vitamins, or minerals. Eat a variety of healthy foods including vegetables, fruits, and whole grains. Contact a health care provider if:  You received a tetanus shot and you have swelling, severe pain, redness, or bleeding at the injection site.  Your pain is not controlled with medicine.  You have redness, swelling, or pain around the wound.  You have fluid or blood coming from the wound.  Your wound feels warm to the touch.  You have pus or a bad smell coming from the  wound.  You have a fever or chills.  You are nauseous or you vomit.  You are dizzy. Get help right away if:  You have a red streak going away from your wound.  The edges of the wound open up and separate.  Your wound is bleeding, and the bleeding does not stop with gentle pressure.  You have a rash.  You faint.  You have trouble breathing. Summary  Always wash your hands with soap and water before changing your bandage (dressing).  To help with healing, eat foods that are rich in protein, vitamin A, vitamin C, and other nutrients.  Check your wound every day for signs of infection. Contact your health care provider if you suspect that your wound is infected. This information is not intended to replace advice given to you by your health care provider. Make sure you discuss any questions you have with your health care provider. Document Revised: 02/11/2019 Document Reviewed: 05/10/2016 Elsevier Patient Education  [place].

## 2020-04-30 NOTE — Assessment & Plan Note (Addendum)
Patient has several skin tags along bra line and on her back and neck.  None of these appear inflamed.  Counseled patient that removing these as not medically necessary, but we are happy to do so if she prefers.  She would like the skin tag at the base of her neck and on her right breast to be removed since they sometimes irritate her.  These were removed without issue today, see procedure note.  No signs of precancerous or cancerous lesions.

## 2020-05-04 DIAGNOSIS — M62838 Other muscle spasm: Secondary | ICD-10-CM | POA: Insufficient documentation

## 2020-05-04 DIAGNOSIS — R7989 Other specified abnormal findings of blood chemistry: Secondary | ICD-10-CM | POA: Insufficient documentation

## 2020-05-04 NOTE — Assessment & Plan Note (Signed)
Patient with muscle spasms of bilateral upper extremity, bilateral lower extremity.  Unclear etiology.  I did check ABIs which were both very mildly abnormal at around 0.9.  I do not think this is significant ectopy causing her symptoms.  Will check phosphorus and magnesium as has been a long time since these were checked.  Reviewed most recent BMP which showed normal electrolyte function.

## 2020-05-04 NOTE — Assessment & Plan Note (Addendum)
Unclear if this elevated creatinine has become her new baseline or if this is just a persistent elevation secondary to multiple diuretics are medication list.  I did review the rest of her medications, and the only other medication which could be contributing is her lisinopril.  Patient is not taking any known nephrotoxic medications from over-the-counter.  We will continue lisinopril, Lasix, and stop her hydrochlorothiazide.  BP 104/60 while in clinic, so have room to discontinue the HCTZ..  I did recommend that she follow-up in 4 weeks or so for another blood pressure check.

## 2020-05-04 NOTE — Progress Notes (Signed)
   CHIEF COMPLAINT / HPI: 79 year old female who presents for bilateral hand and bilateral leg spasms.  Patient states that intermittently whenever she tries to get a tight grip with her hands or for example open a jar she will get a very tight contracture of her fingers and palm.  This does eventually resolve over the course of the next few minutes.  She states the same thing happens at night when she is laying in bed occasionally.  The patient does not have any symptoms consistent with claudication such as dull achy pain with activity with resolution during rest.  Also note the patient was found to have a persistently elevated creatinine on recent lab work.  Her baseline is 0.9 with greater than 60 GFR.  On last few checks her creatinine has been 1.3, with GFR of 39.  Upon review patient has been taking Lasix 40 mg daily for swelling, which is prescribed by her cardiologist.  She also takes a combination blood pressure medication which includes hydrochlorothiazide.  Lastly she is requesting refills for her eyedrops and for her lisinopril.  PERTINENT  PMH / PSH:    OBJECTIVE: BP 104/60   Pulse 98   Wt 204 lb 6 oz (92.7 kg)   SpO2 99%   BMI 37.38 kg/m   Gen: Very pleasant 79 year old female, no acute distress CV: Regular rate and rhythm, no M/R/G Resp: Lungs with auscultation bilaterally, no accessory muscle use Extremities: 2+ pitting edema bilateral lower extremity, unable to palpate PT/DP bilaterally given the swelling.  No muscle spasms noted when tapping thenar muscles or calf muscles and hands and legs respectively. Neuro: Alert and oriented, Speech clear, No gross deficits  ABI: Left ABI 0.89, right ABI 0.91.  ASSESSMENT / PLAN:  Elevated serum creatinine Unclear if this elevated creatinine has become her new baseline or if this is just a persistent elevation secondary to multiple diuretics are medication list.  I did review the rest of her medications, and the only other  medication which could be contributing is her lisinopril.  Patient is not taking any known nephrotoxic medications from over-the-counter.  We will continue lisinopril, Lasix, and stop her hydrochlorothiazide.  BP 104/60 while in clinic, so have room to discontinue the HCTZ..  I did recommend that she follow-up in 4 weeks or so for another blood pressure check.  Muscle spasms of both lower extremities Patient with muscle spasms of bilateral upper extremity, bilateral lower extremity.  Unclear etiology.  I did check ABIs which were both very mildly abnormal at around 0.9.  I do not think this is significant ectopy causing her symptoms.  Will check phosphorus and magnesium as has been a long time since these were checked.  Reviewed most recent BMP which showed normal electrolyte function.     [name], MD [place]

## 2020-05-08 ENCOUNTER — Other Ambulatory Visit: Payer: Self-pay | Admitting: Family Medicine

## 2020-05-08 DIAGNOSIS — F411 Generalized anxiety disorder: Secondary | ICD-10-CM

## 2020-05-08 NOTE — Telephone Encounter (Signed)
PDMP reviewed appropriate. Please schedule follow-up in next 1-2 months with me for visit.   Thank you!  Jerral Ralph, D.O. [facility], PGY-1

## 2020-05-08 NOTE — Telephone Encounter (Signed)
LVM for patient to call office and make follow up appointment.  Ozella Almond, [place]

## 2020-05-13 DIAGNOSIS — Z124 Encounter for screening for malignant neoplasm of cervix: Secondary | ICD-10-CM | POA: Diagnosis not present

## 2020-05-13 DIAGNOSIS — Z1231 Encounter for screening mammogram for malignant neoplasm of breast: Secondary | ICD-10-CM | POA: Diagnosis not present

## 2020-05-17 ENCOUNTER — Other Ambulatory Visit: Payer: Self-pay | Admitting: Cardiology

## 2020-05-24 ENCOUNTER — Other Ambulatory Visit (INDEPENDENT_AMBULATORY_CARE_PROVIDER_SITE_OTHER): Payer: Self-pay | Admitting: Orthopedic Surgery

## 2020-05-25 DIAGNOSIS — N72 Inflammatory disease of cervix uteri: Secondary | ICD-10-CM | POA: Diagnosis not present

## 2020-05-25 DIAGNOSIS — R87610 Atypical squamous cells of undetermined significance on cytologic smear of cervix (ASC-US): Secondary | ICD-10-CM | POA: Diagnosis not present

## 2020-05-26 ENCOUNTER — Telehealth: Payer: Self-pay | Admitting: Orthopedic Surgery

## 2020-05-26 NOTE — Telephone Encounter (Signed)
Patient called requesting a refill of allopurinol. Please send to [facility] on [address]. Patient asked for a call back when medication has been called in. Patient phone number is [phone].

## 2020-05-26 NOTE — Telephone Encounter (Signed)
I called and advised the pt that she has not been in the office in over a year last ov was 05/20/20 and that was the last time her labs have been checked. She needs to call the office and make an appt whenever she is able to come in the office for evaluation

## 2020-05-28 ENCOUNTER — Other Ambulatory Visit: Payer: Self-pay | Admitting: Family Medicine

## 2020-05-31 ENCOUNTER — Other Ambulatory Visit: Payer: Self-pay | Admitting: Family Medicine

## 2020-06-01 ENCOUNTER — Encounter: Payer: Self-pay | Admitting: Orthopedic Surgery

## 2020-06-01 ENCOUNTER — Ambulatory Visit (INDEPENDENT_AMBULATORY_CARE_PROVIDER_SITE_OTHER): Payer: Medicare Other | Admitting: Physician Assistant

## 2020-06-01 VITALS — Ht 62.0 in | Wt 204.0 lb

## 2020-06-01 DIAGNOSIS — M1A09X0 Idiopathic chronic gout, multiple sites, without tophus (tophi): Secondary | ICD-10-CM | POA: Diagnosis not present

## 2020-06-01 LAB — URIC ACID: Uric Acid, Serum: 10.3 mg/dL — ABNORMAL HIGH (ref 2.5–7.0)

## 2020-06-01 MED ORDER — ALLOPURINOL 100 MG PO TABS: 100.0000 mg | ORAL_TABLET | Freq: Every day | ORAL | 1 refills | Status: DC

## 2020-06-01 NOTE — Progress Notes (Signed)
Office Visit Note   Patient: Kristen [name]           Date of Birth: Mar 26, 1941           MRN: [id number] Visit Date: 06/01/2020              Requested by: Theone Stanley, [address],  [place] [address] PCP: Theone Stanley, DO  Chief Complaint  Patient presents with  . Right Leg - Follow-up  . Left Leg - Follow-up      HPI: Patient presents today requesting a uric acid draw.  Her last uric acid was 8.9 over a year ago.  Since then she has had an extensive hospitalization for gallbladder surgery and some complications.  She has been in and out of the hospital and admits she is no longer been taking her allopurinol she also complains of bilateral lower extremity swelling.  She has been advised to wear compression socks and has not yet done this.  She also has cramping in her hands and her legs from time to time.  Assessment & Plan: Visit Diagnoses:  1. Idiopathic chronic gout of multiple sites without tophus     Plan: Refilled her allopurinol we will draw a uric acid today.  I have offered her compression stockings but she states she cannot quite afford them yet.  She will continue to elevate her repeat feet.  I have recommended coconut water to help with some of the cramping she says she used to do this and will begin doing it again  Follow-Up Instructions: No follow-ups on file.   Ortho Exam  Patient is alert, oriented, no adenopathy, well-dressed, normal affect, normal respiratory effort. Bilateral lower extremity swelling.  She has pitting edema in her ankles and her feet.  Her compartments are all soft no skin breakdown multiple varicosities negative Homans' sign no cellulitis or evidence of infection  Imaging: No results found. No images are attached to the encounter.  Labs: Lab Results  Component Value Date   HGBA1C 5.3 02/15/2016   HGBA1C 5.6 06/09/2014   HGBA1C 5.6 10/10/2013   LABURIC 8.9 (H) 06/05/2018   REPTSTATUS 09/12/2019 FINAL  09/11/2019   GRAMSTAIN  09/06/2019    NO WBC SEEN ABUNDANT YEAST Performed at [facility], [place] [address], [place], [place] [address]    CULT MULTIPLE SPECIES PRESENT, SUGGEST RECOLLECTION (A) 09/11/2019     Lab Results  Component Value Date   ALBUMIN 2.7 (L) 09/16/2019   ALBUMIN 2.7 (L) 09/15/2019   ALBUMIN 2.5 (L) 09/14/2019   LABURIC 8.9 (H) 06/05/2018    Lab Results  Component Value Date   MG 2.3 09/16/2019   MG 2.1 09/15/2019   MG 2.2 09/14/2019   Lab Results  Component Value Date   VD25OH 36.4 03/17/2020   VD25OH 51 04/13/2012    No results found for: PREALBUMIN CBC EXTENDED Latest Ref Rng & Units 03/17/2020 09/16/2019 09/15/2019  WBC 3.4 - 10.8 x10E3/uL 8.6 7.0 7.7  RBC 3.77 - 5.28 x10E6/uL 4.21 3.37(L) 3.49(L)  HGB 11.1 - 15.9 g/dL 12.3 10.4(L) 10.7(L)  HCT 34.0 - 46.6 % 38.4 32.4(L) 33.3(L)  PLT 150 - 450 x10E3/uL 255 617(H) 688(H)  NEUTROABS 1.7 - 7.7 K/uL - 2.4 4.3  LYMPHSABS 0.7 - 4.0 K/uL - 2.3 1.9     Body mass index is 37.31 kg/m.  Orders:  Orders Placed This Encounter  Procedures  . Uric acid   Meds ordered this encounter  Medications  .  allopurinol (ZYLOPRIM) 100 MG tablet    Sig: Take 1 tablet (100 mg total) by mouth daily.    Dispense:  60 tablet    Refill:  1     Procedures: No procedures performed  Clinical Data: No additional findings.  ROS:  All other systems negative, except as noted in the HPI. Review of Systems  Objective: Vital Signs: Ht 5\' 2"  (1.575 m)   Wt (!) 204 lb (92.5 kg)   BMI 37.31 kg/m   Specialty Comments:  No specialty comments available.  PMFS History: Patient Active Problem List   Diagnosis Date Noted  . Elevated serum creatinine 05/04/2020  . Muscle spasms of both lower extremities 05/04/2020  . Seborrheic keratoses 04/30/2020  . Skin tag 04/08/2020  . Fatigue 03/17/2020  . Unsatisfactory living conditions 10/23/2019  . Hx of cholecystectomy 10/02/2019  . Acute renal failure (ARF)  ([place]) 08/29/2019  . A-fib ([place]) 08/29/2019  . Varicose veins of leg with edema, right 05/21/2019  . Bunion of great toe of right foot 05/21/2019  . Osteoarthritis 03/13/2018  . Gait abnormality 03/13/2018  . Flat wart 02/13/2018  . 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 Abnormal Pap smear of cervix 02/08/2016  . Chronic kidney disease 02/08/2016  . Urinary frequency 02/08/2016  . History of diverticulitis 01/22/2016  . Anxiety state 10/17/2014  . Long-term (current) use of anticoagulants 06/10/2014  . Chronic venous insufficiency 06/10/2014  . Obesity (BMI 30-39.9)   . Acute on chronic diastolic CHF (congestive heart failure) ([place])   . [place]idal intertrigo 06/09/2014  . Insomnia 01/10/2011  . Permanent atrial fibrillation ([place])   . OSTEOPENIA 01/31/2008  . Hypertensive heart disease    Past Medical History:  Diagnosis Date  . Abscess of abdominal cavity ([place]) 10/02/2019  . Anticoagulant long-term use    elquis -- managed by cardiology  . Anxiety   . Chronic calculous cholecystitis   . Chronic venous insufficiency    w/  varicose veins  . Depression   . Diastolic CHF, chronic ([place])    followed by cardiology  . Diverticulosis of colon   . Edema of both lower extremities    per pt mostly in summer time  . Essential hypertension, benign   . Fibrocystic breast disease   . Full dentures   . GERD (gastroesophageal reflux disease)    occasional tums and does not eat prior to bedtime  . Gout    08-19-2019  per pt last episode 07/ 2020  . Hiatal hernia   . History of diverticulitis 01/22/2016  . History of recurrent UTIs   . Hx of cholecystectomy 10/02/2019  . OA (osteoarthritis)    knees, lower back  . Permanent atrial fibrillation [facility]) cardiologist--- dr Agustin Cree   first dx 10/ 2011--- histroy DCCV 11-25-2010 by dr Wynonia Lawman (pt's previous cardiologist) and Cardiac cath 12-17-2010 no significant disease  . Scoliosis   . Stress incontinence in  female     Family History  Problem Relation Age of Onset  . Cancer Mother        ovarian  . Kidney disease Father   . Alcohol abuse Father   . Cancer Maternal Aunt        lung- smoker  . Heart disease Maternal Uncle     Past Surgical History:  Procedure Laterality Date  . BILIARY STENT PLACEMENT N/A 09/03/2019   Procedure: BILIARY STENT PLACEMENT;  Surgeon: Clarene Essex, MD;  Location: [facility];  Service: Endoscopy;  Laterality: N/A;  .  BLEPHAROPLASTY Bilateral 02-22-2010  dr Georgia Lopes   upper eyelid's  . BUNIONECTOMY Right 2003  . CATARACT EXTRACTION W/ INTRAOCULAR LENS  IMPLANT, BILATERAL  2018  . CHOLECYSTECTOMY N/A 08/22/2019   Procedure: LAPAROSCOPIC CHOLECYSTECTOMY;  Surgeon: [name], [name], MD;  Location[facility];  Service: General;  Laterality: N/A;  . ERCP N/A 09/03/2019   Procedure: ENDOSCOPIC RETROGRADE CHOLANGIOPANCREATOGRAPHY (ERCP);  Surgeon: Clarene Essex, MD;  Location: [facility];  Service: Endoscopy;  Laterality: N/A;  . ESOPHAGOGASTRODUODENOSCOPY (EGD) WITH PROPOFOL N/A 12/06/2019   Procedure: ESOPHAGOGASTRODUODENOSCOPY (EGD) WITH PROPOFOL;  Surgeon: Clarene Essex, MD;  Location: [facility];  Service: Endoscopy;  Laterality: N/A;  stent removal, ERCP Scope, needs Flouro  . IR SINUS/FIST TUBE CHK-NON GI  09/10/2019  . IR SINUS/FIST TUBE CHK-NON GI  09/24/2019  . KNEE ARTHROSCOPY Left 2002  . REMOVAL OF STONES  09/03/2019   Procedure: REMOVAL OF STONES;  Surgeon: Clarene Essex, MD;  Location: [facility];  Service: Endoscopy;;  . Joan Mayans  09/03/2019   Procedure: Joan Mayans;  Surgeon: Clarene Essex, MD;  Location: [facility];  Service: Endoscopy;;  . STENT REMOVAL  12/06/2019   Procedure: STENT REMOVAL;  Surgeon: Clarene Essex, MD;  Location: [facility];  Service: Endoscopy;;  . TUBAL LIGATION Bilateral 1980   AND RIGHT BREAST EXCISION CYST (BENIGN)   Social History   Occupational History  . Occupation: Retired- Associate Professor: RETIRED  Tobacco Use  . Smoking status: Never Smoker  . Smokeless tobacco: Never Used  Vaping Use  . Vaping Use: Never used  Substance and Sexual Activity  . Alcohol use: Yes    Comment: rarely  . Drug use: Never  . Sexual activity: Not Currently    Birth control/protection: Post-menopausal

## 2020-06-08 ENCOUNTER — Encounter: Payer: Self-pay | Admitting: Cardiology

## 2020-06-08 ENCOUNTER — Other Ambulatory Visit: Payer: Self-pay

## 2020-06-08 ENCOUNTER — Ambulatory Visit (INDEPENDENT_AMBULATORY_CARE_PROVIDER_SITE_OTHER): Payer: Medicare Other | Admitting: Cardiology

## 2020-06-08 VITALS — BP 118/72 | HR 82 | Ht 62.0 in | Wt 210.0 lb

## 2020-06-08 DIAGNOSIS — I5033 Acute on chronic diastolic (congestive) heart failure: Secondary | ICD-10-CM

## 2020-06-08 DIAGNOSIS — I110 Hypertensive heart disease with heart failure: Secondary | ICD-10-CM | POA: Diagnosis not present

## 2020-06-08 DIAGNOSIS — N1831 Chronic kidney disease, stage 3a: Secondary | ICD-10-CM

## 2020-06-08 DIAGNOSIS — M25512 Pain in left shoulder: Secondary | ICD-10-CM | POA: Diagnosis not present

## 2020-06-08 DIAGNOSIS — I4821 Permanent atrial fibrillation: Secondary | ICD-10-CM

## 2020-06-08 NOTE — Progress Notes (Signed)
Cardiology Office Note:    Date:  06/08/2020   ID:  Kristen [name], DOB 05-May-1941, MRN [id number]  PCP:  Kristen Stanley, DO  Cardiologist:  Kristen Campus, MD    Referring MD: Kristen Dawn, MD   No chief complaint on file. Have swelling of lower extremities  History of Present Illness:    Kristen [name] is a 79 y.o. female with past medical history significant for permanent atrial fibrillation, anticoagulated with Eliquis, essential hypertension, diastolic congestive heart failure.  Comes today to my office for follow-up.  She complains of having swollen legs.  We will ask him much more swollen I think I am good in the morning.  She denies having any chest pain shortness of breath tightness squeezing pressure burning chest.  No palpitations  Past Medical History:  Diagnosis Date  . Abscess of abdominal cavity ([place]) 10/02/2019  . Anticoagulant long-term use    elquis -- managed by cardiology  . Anxiety   . Chronic calculous cholecystitis   . Chronic venous insufficiency    w/  varicose veins  . Depression   . Diastolic CHF, chronic ([place])    followed by cardiology  . Diverticulosis of colon   . Edema of both lower extremities    per pt mostly in summer time  . Essential hypertension, benign   . Fibrocystic breast disease   . Full dentures   . GERD (gastroesophageal reflux disease)    occasional tums and does not eat prior to bedtime  . Gout    08-19-2019  per pt last episode 07/ 2020  . Hiatal hernia   . History of diverticulitis 01/22/2016  . History of recurrent UTIs   . Hx of cholecystectomy 10/02/2019  . OA (osteoarthritis)    knees, lower back  . Permanent atrial fibrillation [facility]) cardiologist--- Kristen [name]   first dx 10/ 2011--- histroy DCCV 11-25-2010 by Kristen [name] (pt's previous cardiologist) and Cardiac cath 12-17-2010 no significant disease  . Scoliosis   . Stress incontinence in female     Past Surgical History:  Procedure Laterality Date  .  BILIARY STENT PLACEMENT N/A 09/03/2019   Procedure: BILIARY STENT PLACEMENT;  Surgeon: Kristen Essex, MD;  Location: [facility];  Service: Endoscopy;  Laterality: N/A;  . BLEPHAROPLASTY Bilateral 02-22-2010  Kristen Kristen [name]   upper eyelid's  . BUNIONECTOMY Right 2003  . CATARACT EXTRACTION W/ INTRAOCULAR LENS  IMPLANT, BILATERAL  2018  . CHOLECYSTECTOMY N/A 08/22/2019   Procedure: LAPAROSCOPIC CHOLECYSTECTOMY;  Surgeon: [name], [name], MD;  Location: [facility];  Service: General;  Laterality: N/A;  . ERCP N/A 09/03/2019   Procedure: ENDOSCOPIC RETROGRADE CHOLANGIOPANCREATOGRAPHY (ERCP);  Surgeon: Kristen Essex, MD;  Location: [facility];  Service: Endoscopy;  Laterality: N/A;  . ESOPHAGOGASTRODUODENOSCOPY (EGD) WITH PROPOFOL N/A 12/06/2019   Procedure: ESOPHAGOGASTRODUODENOSCOPY (EGD) WITH PROPOFOL;  Surgeon: Kristen Essex, MD;  Location: [facility];  Service: Endoscopy;  Laterality: N/A;  stent removal, ERCP Scope, needs Flouro  . IR SINUS/FIST TUBE CHK-NON GI  09/10/2019  . IR SINUS/FIST TUBE CHK-NON GI  09/24/2019  . KNEE ARTHROSCOPY Left 2002  . REMOVAL OF STONES  09/03/2019   Procedure: REMOVAL OF STONES;  Surgeon: Kristen Essex, MD;  Location: [facility];  Service: Endoscopy;;  . Kristen [name]  09/03/2019   Procedure: Kristen [name];  Surgeon: Kristen Essex, MD;  Location: [facility];  Service: Endoscopy;;  . STENT REMOVAL  12/06/2019   Procedure: STENT REMOVAL;  Surgeon: Kristen Essex, MD;  Location: [facility];  Service:  Endoscopy;;  . TUBAL LIGATION Bilateral 1980   AND RIGHT BREAST EXCISION CYST (BENIGN)    Current Medications: Current Meds  Medication Sig  . acetaminophen (TYLENOL) 650 MG CR tablet Take 650-1,300 mg by mouth every 8 (eight) hours as needed for pain.  Marland Kitchen alendronate (FOSAMAX) 10 MG tablet TAKE (1) TABLET BY MOUTH PER WEEK TAKE ON EMPTY STOMACH WITH FULL GLASS OF WATER (Patient taking differently: Take 10 mg by mouth every Sunday. TAKE ON EMPTY  STOMACH WITH FULL GLASS OF WATER)  . allopurinol (ZYLOPRIM) 100 MG tablet Take 1 tablet (100 mg total) by mouth daily.  . calcium carbonate (TUMS - DOSED IN MG ELEMENTAL CALCIUM) 500 MG chewable tablet Chew 1-2 tablets by mouth at bedtime.   Marland Kitchen CALCIUM PO Take 1 tablet by mouth 2 (two) times daily.  . Carboxymethylcellul-Glycerin (LUBRICATING EYE DROPS OP) Place 1 drop into both eyes daily as needed (dry eyes).  . cholecalciferol (VITAMIN D3) 25 MCG (1000 UT) tablet Take 1,000 Units by mouth daily.  . clindamycin (CLINDAGEL) 1 % gel APPLY TO AFFECTED AREA TWICE A DAY (Patient taking differently: Apply 1 application topically 2 (two) times daily as needed (wound care). )  . colchicine 0.6 MG tablet Take 1 tablet (0.6 mg total) by mouth 2 (two) times daily. Take twice daily until pain resolves and then take as needed for gout flare. (Patient taking differently: Take 0.6 mg by mouth 2 (two) times daily as needed (gout). )  . [employer] ANTI-FUNGAL 2 % powder APPLY TO AFFECTED AREA TWICE A DAY AFTER SKIN RESOLVES FROM KETOCONAZOLE CREAM (Patient taking differently: Apply 1 application topically daily as needed (fungus). )  . diazepam (VALIUM) 5 MG tablet TAKE 1/2 TABLET (2.5MG  TOTAL) BY MOUTH AT BEDTIME  . ELIQUIS 5 MG TABS tablet TAKE 1 TABLET BY MOUTH TWICE A DAY  . Flax Oil-Fish Oil-Borage Oil (FISH-FLAX-BORAGE) CAPS Take 1 capsule by mouth every other day.   . furosemide (LASIX) 20 MG tablet TAKE 1 TABLET BY MOUTH EVERY DAY  . glucosamine-chondroitin (GLUCOSAMINE-CHONDROITIN DS) 500-400 MG tablet Take 1 tablet by mouth daily.   Marland Kitchen HYDROcodone-acetaminophen (NORCO/VICODIN) 5-325 MG tablet Take 1 tablet by mouth every 6 (six) hours as needed for moderate pain.  Marland Kitchen lisinopril (ZESTRIL) 20 MG tablet Take 1 tablet (20 mg total) by mouth at bedtime.  Marland Kitchen loratadine (CLARITIN) 10 MG tablet TAKE 1 TABLET BY MOUTH EVERY DAY  . Multiple Vitamins-Minerals (EMERGEN-C IMMUNE) PACK Take 1 packet by mouth daily.  . Multiple  Vitamins-Minerals (MULTIVITAMIN WOMEN 50+) TABS Take 1 tablet by mouth daily.   . mupirocin ointment (BACTROBAN) 2 % APPLY TOPICALLY TO AFFECTED AREA TWICE DAILY (Patient taking differently: Apply 1 application topically 2 (two) times daily as needed (wound care). )  . Nutritional Supplements (ECHINACEA/GOLDEN SEAL PO) Take 1 tablet by mouth daily.   Marland Kitchen nystatin (MYCOSTATIN/NYSTOP) powder Apply 1 application topically 2 (two) times daily.  . pantoprazole (PROTONIX) 40 MG tablet Take 1 tablet (40 mg total) by mouth daily.  . polycarbophil (FIBERCON) 625 MG tablet Take 625 mg by mouth daily.  . potassium chloride (KLOR-CON) 20 MEQ packet Take 20 mEq by mouth daily.  . Probiotic Product (PROBIOTIC-10 PO) Take 1 tablet by mouth daily.   Marland Kitchen Propylene Glycol (SYSTANE COMPLETE) 0.6 % SOLN Place 1 drop into both eyes daily.  . RESTASIS 0.05 % ophthalmic emulsion INSTILL 1 DROP INTO BOTH EYES TWICE A DAY     Allergies:   Flecainide acetate   Social  History   Socioeconomic History  . Marital status: Widowed    Spouse name: [name]  . Number of children: 1  . Years of education: Masters  . Highest education level: Master's degree (e.g., MA, MS, MEng, MEd, MSW, MBA)  Occupational History  . Occupation: Retired- Product manager: RETIRED  Tobacco Use  . Smoking status: Never Smoker  . Smokeless tobacco: Never Used  Vaping Use  . Vaping Use: Never used  Substance and Sexual Activity  . Alcohol use: Yes    Comment: rarely  . Drug use: Never  . Sexual activity: Not Currently    Birth control/protection: Post-menopausal  Other Topics Concern  . Not on file  Social History Narrative   Emergency Contact: Leta Baptist [phone]   Who lives with you: self   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 Exercise: Pt dances regularly for shows   Seatbelts: Pt reports wearing seatbelt when in vehicles.    Sun  Exposure/Protection: Pt reports not being in the sun very much   Hobbies: dancing, painting,    Working smoke alarm: yes   Home throw rugs: yes   Home free from clutter: yes   ______________________________________________________________________________________   Current Social History     Who lives at home: lives alone, retired Education officer, museum and librarian  10/25/2019    Transportation: provided by [facility] [employer] and her female friend 10/25/2019   Important Relationships & Pets: has cats and a friend that she talks to or sees daily.  Reports no family local 10/25/2019    Current Stressors: clutter in her home 10/25/2019   Other: Unable to prepare meals and needs help with keeping her home clean.  Describes house as being cluttered with narrow paths in her trailer.  No cental heat uses space heaters for the past year. Leaking kitchen causes water bill to be higher than it should be 10/25/2019   Casimer Lanius, LCSW   Clinical Social Worker                                                                                                         Social Determinants of Health   Financial Resource Strain:   . Difficulty of Paying Living Expenses:   Food Insecurity: Food Insecurity Present  . Worried About Charity fundraiser in the Last Year: Sometimes true  . Ran Out of Food in the Last Year: Sometimes true  Transportation Needs: No Transportation Needs  . Lack of Transportation (Medical): No  . Lack of Transportation (Non-Medical): No  Physical Activity:   . Days of Exercise per Week:   . Minutes of Exercise per Session:   Stress:   . Feeling of Stress :   Social Connections:   . Frequency of Communication with Friends and Family:   . Frequency of Social Gatherings with Friends and Family:   . Attends Religious Services:   . Active Member of Clubs or Organizations:   .  Attends Archivist Meetings:   Marland Kitchen Marital Status:      Family History: The patient's family history  includes Alcohol abuse in her father; Cancer in her maternal aunt and mother; Heart disease in her maternal uncle; Kidney disease in her father. ROS:   Please see the history of present illness.    All 14 point review of systems negative except as described per history of present illness  EKGs/Labs/Other Studies Reviewed:      Recent Labs: 08/28/2019: B Natriuretic Peptide 310.5 09/16/2019: ALT 16; Magnesium 2.3 02/24/2020: NT-Pro BNP 619 03/17/2020: Hemoglobin 12.3; Platelets 255; TSH 3.060 04/07/2020: BUN 37; Creatinine, Ser 1.31; Potassium 4.5; Sodium 139  Recent Lipid Panel    Component Value Date/Time   CHOL 224 (H) 06/09/2014 1427   TRIG 142 06/09/2014 1427   HDL 64 06/09/2014 1427   CHOLHDL 3.5 06/09/2014 1427   VLDL 28 06/09/2014 1427   LDLCALC 132 (H) 06/09/2014 1427    Physical Exam:    VS:  BP 118/72 (BP Location: Right Arm, Patient Position: Sitting, Cuff Size: Large)   Pulse 82   Ht 5\' 2"  (1.575 m)   Wt 210 lb (95.3 kg)   SpO2 96%   BMI 38.41 kg/m     Wt Readings from Last 3 Encounters:  06/08/20 210 lb (95.3 kg)  06/01/20 (!) 204 lb (92.5 kg)  04/30/20 204 lb 9.6 oz (92.8 kg)     GEN:  Well nourished, well developed in no acute distress HEENT: Normal NECK: No JVD; No carotid bruits LYMPHATICS: No lymphadenopathy CARDIAC: Irregular irregular, tones are distant:  Clear to auscultation without rales, wheezing or rhonchi  ABDOMEN: Soft, non-tender, non-distended MUSCULOSKELETAL:  No edema; No deformity  SKIN: Warm and dry LOWER EXTREMITIES: 1+ swelling NEUROLOGIC:  Alert and oriented x 3 PSYCHIATRIC:  Normal affect   ASSESSMENT:    1. Acute on chronic diastolic CHF (congestive heart failure) ([place])   2. Permanent atrial fibrillation ([place])   3. Hypertensive heart disease with acute on chronic diastolic congestive heart failure (H[facility])   4. Stage 3a chronic kidney disease    PLAN:    In order of problems listed above:  1. Diastolic congestive heart  failure legs are much more swollen than before.  I asked her to have proBNP as well as Chem-7 to decide if we can augment her diuresis.  We do have to be very careful because she got high uric acid as well as history of gout.  I did talk to her about keeping her legs elevated, avoiding salty food as well as using elastic stockings.  Obviously because of hot weather right now she have difficulty doing it. 2. Permanent atrial fibrillation: Rate appears to be controlled continue present management, continue anticoagulation. 3. Essential hypertension her blood pressure is on the lower side we will continue present management. 4. Swelling of lower extremities: Worsening right now.  Plan as outlined above.   Medication Adjustments/Labs and Tests Ordered: Current medicines are reviewed at length with the patient today.  Concerns regarding medicines are outlined above.  Orders Placed This Encounter  Procedures  . Basic metabolic panel  . Pro b natriuretic peptide (BNP)   Medication changes: No orders of the defined types were placed in this encounter.   Signed, Park Liter, MD, [facility] 06/08/2020 3:00 PM    [place]

## 2020-06-08 NOTE — Patient Instructions (Signed)
Medication Instructions:  Your physician recommends that you continue on your current medications as directed. Please refer to the Current Medication list given to you today.  *If you need a refill on your cardiac medications before your next appointment, please call your pharmacy*   Lab Work: Your physician recommends that you return for lab work today: pro bnp, bmp   If you have labs (blood work) drawn today and your tests are completely normal, you will receive your results only by: . MyChart Message (if you have MyChart) OR . A paper copy in the mail If you have any lab test that is abnormal or we need to change your treatment, we will call you to review the results.   Testing/Procedures: None   Follow-Up: At [facility], you and your health needs are our priority.  As part of our continuing mission to provide you with exceptional heart care, we have created designated Provider Care Teams.  These Care Teams include your primary Cardiologist (physician) and Advanced Practice Providers (APPs -  Physician Assistants and Nurse Practitioners) who all work together to provide you with the care you need, when you need it.  We recommend signing up for the patient portal called "MyChart".  Sign up information is provided on this After Visit Summary.  MyChart is used to connect with patients for Virtual Visits (Telemedicine).  Patients are able to view lab/test results, encounter notes, upcoming appointments, etc.  Non-urgent messages can be sent to your provider as well.   To learn more about what you can do with MyChart, go to [email or web address].    Your next appointment:   3 month(s)  The format for your next appointment:   In Person  Provider:   Robert Krasowski, MD   Other Instructions     

## 2020-06-09 LAB — BASIC METABOLIC PANEL
BUN/Creatinine Ratio: 23 (ref 12–28)
BUN: 30 mg/dL — ABNORMAL HIGH (ref 8–27)
CO2: 26 mmol/L (ref 20–29)
Calcium: 9.5 mg/dL (ref 8.7–10.3)
Chloride: 99 mmol/L (ref 96–106)
Creatinine, Ser: 1.28 mg/dL — ABNORMAL HIGH (ref 0.57–1.00)
GFR calc Af Amer: 46 mL/min/{1.73_m2} — ABNORMAL LOW (ref >59)
GFR calc non Af Amer: 40 mL/min/{1.73_m2} — ABNORMAL LOW (ref >59)
Glucose: 98 mg/dL (ref 65–99)
Potassium: 4.6 mmol/L (ref 3.5–5.2)
Sodium: 141 mmol/L (ref 134–144)

## 2020-06-09 LAB — PRO B NATRIURETIC PEPTIDE: NT-Pro BNP: 780 pg/mL — ABNORMAL HIGH (ref 0–738)

## 2020-06-12 ENCOUNTER — Telehealth: Payer: Self-pay | Admitting: Cardiology

## 2020-06-12 DIAGNOSIS — I5033 Acute on chronic diastolic (congestive) heart failure: Secondary | ICD-10-CM

## 2020-06-12 DIAGNOSIS — M25471 Effusion, right ankle: Secondary | ICD-10-CM

## 2020-06-12 NOTE — Telephone Encounter (Signed)
New Message  Pt is calling and is requesting her lab results from Monday   Please call

## 2020-06-16 MED ORDER — FUROSEMIDE 20 MG PO TABS: ORAL_TABLET | ORAL | 2 refills | Status: DC

## 2020-06-16 NOTE — Telephone Encounter (Deleted)
Double lasix every other day, chem7 in 1 week

## 2020-06-16 NOTE — Telephone Encounter (Addendum)
Lm to call back ./cy  Park Liter, MD  06/16/2020 1:43 PM EDT Back to Top    Double lasix every other day, chem7 in 1 week

## 2020-06-16 NOTE — Telephone Encounter (Signed)
Patient returning call.

## 2020-06-16 NOTE — Telephone Encounter (Addendum)
Pt aware of recommendations and agrees with plan ./cy 

## 2020-06-23 ENCOUNTER — Telehealth: Payer: Self-pay | Admitting: Cardiology

## 2020-06-23 ENCOUNTER — Other Ambulatory Visit: Payer: Medicare Other

## 2020-06-23 NOTE — Telephone Encounter (Signed)
Patient wanted to switch providers from Dr. Agustin Cree to Dr. Tamala Julian or Dr. Marlou Porch. The patient was very pleased with Dr. Agustin Cree but she lives much closer to the [employer].  Please let the patient know which provider at [facility] would be willing to accept her as a patient

## 2020-06-23 NOTE — Telephone Encounter (Signed)
OK with me Thanks [name]!  You will always be the best! [name], MD

## 2020-06-23 NOTE — Telephone Encounter (Signed)
Fine with me

## 2020-06-23 NOTE — Telephone Encounter (Signed)
Will forward message to Dr. Marlou Porch and his nurse as well as to Dr. Tamala Julian and his nurse.

## 2020-06-23 NOTE — Telephone Encounter (Signed)
Dr. Marlou Porch is the best!

## 2020-06-25 ENCOUNTER — Other Ambulatory Visit: Payer: Self-pay

## 2020-06-25 ENCOUNTER — Other Ambulatory Visit: Payer: Medicare Other

## 2020-06-25 DIAGNOSIS — I5033 Acute on chronic diastolic (congestive) heart failure: Secondary | ICD-10-CM

## 2020-06-25 DIAGNOSIS — M25471 Effusion, right ankle: Secondary | ICD-10-CM | POA: Diagnosis not present

## 2020-06-25 NOTE — Telephone Encounter (Signed)
appt scheduled with Dr Marlou Porch for 10/19.  Pt is aware and grateful that Dr Marlou Porch will take her on as a patient.

## 2020-06-26 ENCOUNTER — Telehealth: Payer: Self-pay | Admitting: Emergency Medicine

## 2020-06-26 DIAGNOSIS — R7989 Other specified abnormal findings of blood chemistry: Secondary | ICD-10-CM

## 2020-06-26 LAB — BASIC METABOLIC PANEL
BUN/Creatinine Ratio: 24 (ref 12–28)
BUN: 41 mg/dL — ABNORMAL HIGH (ref 8–27)
CO2: 26 mmol/L (ref 20–29)
Calcium: 9.5 mg/dL (ref 8.7–10.3)
Chloride: 100 mmol/L (ref 96–106)
Creatinine, Ser: 1.69 mg/dL — ABNORMAL HIGH (ref 0.57–1.00)
GFR calc Af Amer: 33 mL/min/{1.73_m2} — ABNORMAL LOW (ref >59)
GFR calc non Af Amer: 28 mL/min/{1.73_m2} — ABNORMAL LOW (ref >59)
Glucose: 92 mg/dL (ref 65–99)
Potassium: 4.5 mmol/L (ref 3.5–5.2)
Sodium: 142 mmol/L (ref 134–144)

## 2020-06-26 NOTE — Telephone Encounter (Signed)
-----   Message from Park Liter, MD sent at 06/26/2020 10:00 AM EDT ----- Laboratory test shows slightly worsening of kidney function still within acceptable range, will get a recheck blood test Chem-7 within next 10 days

## 2020-06-26 NOTE — Telephone Encounter (Signed)
Called patient informed her of results and informed her to have labs redrawn in 10 days she verbally understood no further questions.

## 2020-06-30 DIAGNOSIS — H10013 Acute follicular conjunctivitis, bilateral: Secondary | ICD-10-CM | POA: Diagnosis not present

## 2020-06-30 DIAGNOSIS — H40033 Anatomical narrow angle, bilateral: Secondary | ICD-10-CM | POA: Diagnosis not present

## 2020-07-03 ENCOUNTER — Other Ambulatory Visit: Payer: Self-pay

## 2020-07-03 ENCOUNTER — Other Ambulatory Visit: Payer: Self-pay | Admitting: Family Medicine

## 2020-07-03 ENCOUNTER — Ambulatory Visit (INDEPENDENT_AMBULATORY_CARE_PROVIDER_SITE_OTHER): Payer: Medicare Other | Admitting: Family Medicine

## 2020-07-03 VITALS — BP 126/62 | HR 81 | Wt 201.0 lb

## 2020-07-03 DIAGNOSIS — R7989 Other specified abnormal findings of blood chemistry: Secondary | ICD-10-CM

## 2020-07-03 DIAGNOSIS — R519 Headache, unspecified: Secondary | ICD-10-CM | POA: Diagnosis not present

## 2020-07-03 NOTE — Assessment & Plan Note (Signed)
Patient has lasix and lisinopril, baseline cr 1.2, did not follow up after last visit as instructed. On 8/19 had elevation of s cr to 1.69. She has not used NSAIDs, recommended to continue to not use them. Will recheck BMP today and if persistently elevated will have her discontinue lisinopril and follow up.

## 2020-07-03 NOTE — Progress Notes (Signed)
SUBJECTIVE:   CHIEF COMPLAINT / HPI: headache  Headache Onset: Sunday night Location: right temporal and right forehead Quality: dull, aching Frequency: intermittent but daily Precipitating factors: exertion moving boxes on Sunday Prior treatment: tylenol, which has worked   Associated Symptoms Nausea/vomiting: no  Photophobia/phonophobia: no  Tearing of eyes: no  Sinus pain/pressure: no  Family hx migraine: no  Personal stressors: yes - moving and cleaning house, moving many boxes, exertion Relation to menstrual cycle: no   Red Flags Fever: no  Neck pain/stiffness: no  Vision/speech/swallow/hearing difficulty: no  Focal weakness/numbness: no  Altered mental status: no  Trauma: no  New type of headache: yes  Anticoagulant use: yes  H/o cancer/HIV/Pregnancy: no   CKD: on chart review, patient had BMP on 8/19 that revealed interval worsening of scr to 1.6. Baseline is ~1.2. She reports she has not used any NSAIDs for her headache. Only change in medication is use of allopurinol and colchicine for recent gout flare.  PERTINENT  PMH / PSH: a. Fib on eliquis  OBJECTIVE:   BP 126/62   Pulse 81   Wt 201 lb (91.2 kg)   SpO2 97%   BMI 36.76 kg/m   Physical Exam Vitals and nursing note reviewed.  Constitutional:      General: She is not in acute distress.    Appearance: She is well-developed. She is obese. She is not ill-appearing or toxic-appearing.  HENT:     Head: Normocephalic and atraumatic.     Mouth/Throat:     Mouth: Mucous membranes are moist.     Pharynx: Oropharynx is clear.  Eyes:     General: No visual field deficit or scleral icterus.    Extraocular Movements: Extraocular movements intact.     Right eye: Normal extraocular motion and no nystagmus.     Left eye: Normal extraocular motion and no nystagmus.     Pupils: Pupils are equal, round, and reactive to light. Pupils are equal.     Right eye: Pupil is round and reactive.     Left eye: Pupil is  round and reactive.  Cardiovascular:     Rate and Rhythm: Normal rate and regular rhythm.     Heart sounds: Normal heart sounds. No murmur heard.  No friction rub. No gallop.   Pulmonary:     Effort: Pulmonary effort is normal.     Breath sounds: Normal breath sounds.  Musculoskeletal:     Cervical back: Normal range of motion and neck supple. No rigidity.  Lymphadenopathy:     Cervical: No cervical adenopathy.  Skin:    General: Skin is warm and dry.  Neurological:     Mental Status: She is alert and oriented to person, place, and time. Mental status is at baseline.     GCS: GCS eye subscore is 4. GCS verbal subscore is 5. GCS motor subscore is 6.     Cranial Nerves: No cranial nerve deficit, dysarthria or facial asymmetry.     Sensory: No sensory deficit.     Motor: No weakness.     Coordination: Coordination normal.     Gait: Gait normal.     Deep Tendon Reflexes: Reflexes normal.  Psychiatric:        Mood and Affect: Mood normal.        Behavior: Behavior normal.    ASSESSMENT/PLAN:   Headache Patient with likely tension type headache after recent exertion and stress related to household and cleaning new trailer. Possibility that it could  be temporal arteritis due to age and some tenderness to palpation over right temple. Patient had no trauma, no focal deficits, and due to mild nature of pain that responds to acetaminophen, recommend continuing this for treatment. Could give patient muscle relaxant, but with age, co-morbidities would like to treat with tylenol first. Can consider baclofen (not on Beer's list) if not improved. - Return precautions given (see AVS) - use tylenol, heat and ice to improve pain - Check ESR, if markedly elevated, consider steroids and obtaining biopsy - F/U if no improvement in 1 week  Elevated serum creatinine Patient has lasix and lisinopril, baseline cr 1.2, did not follow up after last visit as instructed. On 8/19 had elevation of s cr to  1.69. She has not used NSAIDs, recommended to continue to not use them. Will recheck BMP today and if persistently elevated will have her discontinue lisinopril and follow up.    Gladys Damme, MD [place]

## 2020-07-03 NOTE — Assessment & Plan Note (Signed)
Patient with likely tension type headache after recent exertion and stress related to household and cleaning new trailer. Possibility that it could be temporal arteritis due to age and some tenderness to palpation over right temple. Patient had no trauma, no focal deficits, and due to mild nature of pain that responds to acetaminophen, recommend continuing this for treatment. Could give patient muscle relaxant, but with age, co-morbidities would like to treat with tylenol first. Can consider baclofen (not on Beer's list) if not improved. - Return precautions given (see AVS) - use tylenol, heat and ice to improve pain - Check ESR, if markedly elevated, consider steroids and obtaining biopsy - F/U if no improvement in 1 week

## 2020-07-03 NOTE — Patient Instructions (Signed)
For your headache:  - It is unlikely that you have an aneurysm, but more likely you have a headache from a tense muscle.  - I recommend using tylenol, diclofenac gel on the area, heat and ice packs to help with the pain.   - We took some blood and some urine to check on your kidney function since it was worse on 8/19, I will let you know those results  - Please scheduled a follow up to discuss your other problems with your primary care physician  - If your pain is not better with the above regimen, I recommend scheduling a follow up appointment .  - If you have any acute (quick) change in vision (such as blind spots), trouble speaking, trouble moving, or change in sensation (one arm cannot feel things like the other arm, as an example) please call our office and go to the emergency department  I hope you feel better!  Dr. Chauncey Reading   Tension Headache, Adult A tension headache is pain, pressure, or aching in your head. Tension headaches can last from 30 minutes to several days. Follow these instructions at home: Managing pain  Take over-the-counter and prescription medicines only as told by your doctor.  When you have a headache, lie down in a dark, quiet room.  If told, put ice on your head and neck: ? Put ice in a plastic bag. ? Place a towel between your skin and the bag. ? Leave the ice on for 20 minutes, 2-3 times a day.  If told, put heat on the back of your neck. Do this as often as your doctor tells you to. Use the kind of heat that your doctor recommends, such as a moist heat pack or a heating pad. ? Place a towel between your skin and the heat. ? Leave the heat on for 20-30 minutes. ? Remove the heat if your skin turns bright red. Eating and drinking  Eat meals on a regular schedule.  Watch how much alcohol you drink: ? If you are a woman and are not pregnant, do not drink more than 1 drink a day. ? If you are a man, do not drink more than 2 drinks a day.  Drink  enough fluid to keep your pee (urine) pale yellow.  Do not use a lot of caffeine, or stop using caffeine. Lifestyle  Get enough sleep. Get 7-9 hours of sleep each night. Or get the amount of sleep that your doctor tells you to.  At bedtime, remove all electronic devices from your room. Examples of electronic devices are computers, phones, and tablets.  Find ways to lessen your stress. Some things that can lessen stress are: ? Exercise. ? Deep breathing. ? Yoga. ? Music. ? Positive thoughts.  Sit up straight. Do not tighten (tense) your muscles.  Do not use any products that have nicotine or tobacco in them, such as cigarettes and e-cigarettes. If you need help quitting, ask your doctor. General instructions   Keep all follow-up visits as told by your doctor. This is important.  Avoid things that can bring on headaches. Keep a journal to find out if certain things bring on headaches. For example, write down: ? What you eat and drink. ? How much sleep you get. ? Any change to your diet or medicines. Contact a doctor if:  Your headache does not get better.  Your headache comes back.  You have a headache and sounds, light, or smells bother you.  You feel  sick to your stomach (nauseous) or you throw up (vomit).  Your stomach hurts. Get help right away if:  You suddenly get a very bad headache along with any of these: ? A stiff neck. ? Feeling sick to your stomach. ? Throwing up. ? Feeling weak. ? Trouble seeing. ? Feeling short of breath. ? A rash. ? Feeling unusually sleepy. ? Trouble speaking. ? Pain in your eye or ear. ? Trouble walking or balancing. ? Feeling like you will pass out (faint). ? Passing out. Summary  A tension headache is pain, pressure, or aching in your head.  Tension headaches can last from 30 minutes to several days.  Lifestyle changes and medicines may help relieve pain. This information is not intended to replace advice given to you by  your health care provider. Make sure you discuss any questions you have with your health care provider. Document Revised: 08/21/2019 Document Reviewed: 02/03/2017 Elsevier Patient Education  [place].

## 2020-07-04 LAB — BASIC METABOLIC PANEL
BUN/Creatinine Ratio: 37 — ABNORMAL HIGH (ref 12–28)
BUN: 45 mg/dL — ABNORMAL HIGH (ref 8–27)
CO2: 25 mmol/L (ref 20–29)
Calcium: 9.6 mg/dL (ref 8.7–10.3)
Chloride: 100 mmol/L (ref 96–106)
Creatinine, Ser: 1.23 mg/dL — ABNORMAL HIGH (ref 0.57–1.00)
GFR calc Af Amer: 48 mL/min/{1.73_m2} — ABNORMAL LOW (ref >59)
GFR calc non Af Amer: 42 mL/min/{1.73_m2} — ABNORMAL LOW (ref >59)
Glucose: 77 mg/dL (ref 65–99)
Potassium: 3.9 mmol/L (ref 3.5–5.2)
Sodium: 141 mmol/L (ref 134–144)

## 2020-07-04 LAB — MICROALBUMIN / CREATININE URINE RATIO
Creatinine, Urine: 79.9 mg/dL
Microalb/Creat Ratio: 5 mg/g creat (ref 0–29)
Microalbumin, Urine: 4 ug/mL

## 2020-07-04 LAB — SEDIMENTATION RATE: Sed Rate: 29 mm/hr (ref 0–40)

## 2020-07-05 ENCOUNTER — Encounter: Payer: Self-pay | Admitting: Family Medicine

## 2020-07-05 DIAGNOSIS — N1832 Chronic kidney disease, stage 3b: Secondary | ICD-10-CM

## 2020-07-08 ENCOUNTER — Other Ambulatory Visit: Payer: Self-pay

## 2020-07-08 ENCOUNTER — Other Ambulatory Visit: Payer: Medicare Other | Admitting: *Deleted

## 2020-07-08 DIAGNOSIS — I8311 Varicose veins of right lower extremity with inflammation: Secondary | ICD-10-CM | POA: Diagnosis not present

## 2020-07-08 DIAGNOSIS — R7989 Other specified abnormal findings of blood chemistry: Secondary | ICD-10-CM | POA: Diagnosis not present

## 2020-07-08 DIAGNOSIS — I8312 Varicose veins of left lower extremity with inflammation: Secondary | ICD-10-CM | POA: Diagnosis not present

## 2020-07-08 NOTE — Addendum Note (Signed)
Addended by: Aris Georgia, Pansie Guggisberg L on: 07/08/2020 04:31 PM   Modules accepted: Orders

## 2020-07-09 LAB — BASIC METABOLIC PANEL WITH GFR
BUN/Creatinine Ratio: 31 — ABNORMAL HIGH (ref 12–28)
BUN: 53 mg/dL — ABNORMAL HIGH (ref 8–27)
CO2: 24 mmol/L (ref 20–29)
Calcium: 9.7 mg/dL (ref 8.7–10.3)
Chloride: 96 mmol/L (ref 96–106)
Creatinine, Ser: 1.71 mg/dL — ABNORMAL HIGH (ref 0.57–1.00)
GFR calc Af Amer: 32 mL/min/1.73 — ABNORMAL LOW (ref >59)
GFR calc non Af Amer: 28 mL/min/1.73 — ABNORMAL LOW (ref >59)
Glucose: 118 mg/dL — ABNORMAL HIGH (ref 65–99)
Potassium: 3.7 mmol/L (ref 3.5–5.2)
Sodium: 138 mmol/L (ref 134–144)

## 2020-07-10 ENCOUNTER — Telehealth: Payer: Self-pay | Admitting: Cardiology

## 2020-07-10 ENCOUNTER — Telehealth: Payer: Self-pay

## 2020-07-10 NOTE — Telephone Encounter (Signed)
Patient is calling for lab results.

## 2020-07-10 NOTE — Telephone Encounter (Signed)
Patient calls nurse line requesting lab results. Results given to patient and need for Kidney Specialist if she did not already have one. Patient reports she goes to [facility] and sees Dr. Jeffie Pollock. Patient reminded of PCP apt next week.

## 2020-07-10 NOTE — Telephone Encounter (Signed)
Patient called and wants to know her lab results.

## 2020-07-10 NOTE — Telephone Encounter (Signed)
Patient following up.

## 2020-07-12 ENCOUNTER — Other Ambulatory Visit: Payer: Self-pay | Admitting: Family Medicine

## 2020-07-12 DIAGNOSIS — F411 Generalized anxiety disorder: Secondary | ICD-10-CM

## 2020-07-16 ENCOUNTER — Encounter: Payer: Self-pay | Admitting: Family Medicine

## 2020-07-16 ENCOUNTER — Other Ambulatory Visit: Payer: Self-pay

## 2020-07-16 ENCOUNTER — Ambulatory Visit (INDEPENDENT_AMBULATORY_CARE_PROVIDER_SITE_OTHER): Payer: Medicare Other | Admitting: Family Medicine

## 2020-07-16 VITALS — BP 102/62 | HR 82 | Ht 62.0 in | Wt 206.0 lb

## 2020-07-16 DIAGNOSIS — Z23 Encounter for immunization: Secondary | ICD-10-CM

## 2020-07-16 DIAGNOSIS — N1832 Chronic kidney disease, stage 3b: Secondary | ICD-10-CM

## 2020-07-16 MED ORDER — TETANUS-DIPHTH-ACELL PERTUSSIS 5-2.5-18.5 LF-MCG/0.5 IM SUSP: 0.5000 mL | Freq: Once | INTRAMUSCULAR | 0 refills | Status: AC

## 2020-07-16 NOTE — Patient Instructions (Addendum)
It was great seeing you today!   Please check-out at the front desk before leaving the clinic. I'd like to see you back in the next 1-2 weeks to do your blood work and remove your skin tag if you are still interested. I have also put in a referral to a nephrologist to check your kidneys. They will call you to schedule an appointment  Visit Remembers: - Stop by the pharmacy to pick up your Valium and to get your Tdap vaccine  - Continue to work on your healthy eating habits and incorporating exercise into your daily life.    If you haven't already, sign up for My Chart to have easy access to your labs results, and communication with your primary care physician.  Feel free to call with any questions or concerns at any time, at [phone].   Take care,  Dr. Yehuda [facility]     https://www.cdc.gov/vaccines/hcp/vis/vis-statements/tdap.pdf">  Tdap (Tetanus, Diphtheria, Pertussis) Vaccine: What You Need to Know 1. Why get vaccinated? Tdap vaccine can prevent tetanus, diphtheria, and pertussis. Diphtheria and pertussis spread from person to person. Tetanus enters the body through cuts or wounds.  TETANUS (T) causes painful stiffening of the muscles. Tetanus can lead to serious health problems, including being unable to open the mouth, having trouble swallowing and breathing, or death.  DIPHTHERIA (D) can lead to difficulty breathing, heart failure, paralysis, or death.  PERTUSSIS (aP), also known as "whooping cough," can cause uncontrollable, violent coughing which makes it hard to breathe, eat, or drink. Pertussis can be extremely serious in babies and young children, causing pneumonia, convulsions, brain damage, or death. In teens and adults, it can cause weight loss, loss of bladder control, passing out, and rib fractures from severe coughing. 2. Tdap vaccine Tdap is only for children 7 years and older, adolescents, and adults.  Adolescents should receive a single  dose of Tdap, preferably at age 69 or 3 years. Pregnant women should get a dose of Tdap during every pregnancy, to protect the newborn from pertussis. Infants are most at risk for severe, life-threatening complications from pertussis. Adults who have never received Tdap should get a dose of Tdap. Also, adults should receive a booster dose every 10 years, or earlier in the case of a severe and dirty wound or burn. Booster doses can be either Tdap or Td (a different vaccine that protects against tetanus and diphtheria but not pertussis). Tdap may be given at the same time as other vaccines. 3. Talk with your health care provider Tell your vaccine provider if the person getting the vaccine:  Has had an allergic reaction after a previous dose of any vaccine that protects against tetanus, diphtheria, or pertussis, or has any severe, life-threatening allergies.  Has had a coma, decreased level of consciousness, or prolonged seizures within 7 days after a previous dose of any pertussis vaccine (DTP, DTaP, or Tdap).  Has seizures or another nervous system problem.  Has ever had Guillain-Barr Syndrome (also called GBS).  Has had severe pain or swelling after a previous dose of any vaccine that protects against tetanus or diphtheria. In some cases, your health care provider may decide to postpone Tdap vaccination to a future visit.  People with minor illnesses, such as a cold, may be vaccinated. People who are moderately or severely ill should usually wait until they recover before getting Tdap vaccine.  Your health care provider can give you more information. 4. Risks of a vaccine reaction  Pain,  redness, or swelling where the shot was given, mild fever, headache, feeling tired, and nausea, vomiting, diarrhea, or stomachache sometimes happen after Tdap vaccine. People sometimes faint after medical procedures, including vaccination. Tell your provider if you feel dizzy or have vision changes or  ringing in the ears.  As with any medicine, there is a very remote chance of a vaccine causing a severe allergic reaction, other serious injury, or death. 5. What if there is a serious problem? An allergic reaction could occur after the vaccinated person leaves the clinic. If you see signs of a severe allergic reaction (hives, swelling of the face and throat, difficulty breathing, a fast heartbeat, dizziness, or weakness), call 9-1-1 and get the person to the nearest hospital. For other signs that concern you, call your health care provider.  Adverse reactions should be reported to the Vaccine Adverse Event Reporting System (VAERS). Your health care provider will usually file this report, or you can do it yourself. Visit the VAERS website at www.vaers.SamedayNews.es or call (267)221-4449. VAERS is only for reporting reactions, and VAERS staff do not give medical advice. 6. The National Vaccine Injury Compensation Program The [employer] Vaccine Injury Compensation Program (VICP) is a federal program that was created to compensate people who may have been injured by certain vaccines. Visit the VICP website at GoldCloset.com.ee or call 717-597-6212 to learn about the program and about filing a claim. There is a time limit to file a claim for compensation. 7. How can I learn more?  Ask your health care provider.  Call your local or state health department.  Contact the Centers for Disease Control and Prevention (CDC): ? Call (319)498-3900 (1-800-CDC-INFO) or ? Visit CDC's website at http://hunter.com/ Vaccine Information Statement Tdap (Tetanus, Diphtheria, Pertussis) Vaccine (02/06/2019) This information is not intended to replace advice given to you by your health care provider. Make sure you discuss any questions you have with your health care provider. Document Revised: 02/15/2019 Document Reviewed: 02/18/2019 Elsevier Patient Education  [place].

## 2020-07-16 NOTE — Telephone Encounter (Signed)
Left message for patient to return call.

## 2020-07-17 NOTE — Progress Notes (Signed)
    SUBJECTIVE:   CHIEF COMPLAINT / HPI:   Kristen [name] is a 79 yo female here for her annual exam.   Health Maintenance Patient is concerned that she is gaining weight despite making good food choices. States she gets physical activity from walking when she goes to the store.   Skin Concerns Patient complains of a sore on her lower right leg that she believes appeared about a week ago. It does not itch and isnt painful. She does not recall an inciting injury. She also complains of a skin tag on the inside of her left thigh that does not hurt but does snag on her clothes.   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  Pulmonary:     Effort: Pulmonary effort is normal.     Breath sounds: Normal breath sounds.  Abdominal:     General: There is no distension.     Palpations: Abdomen is soft.  Skin:    General: Skin is warm and dry.     Comments: Skin tag on left inner thigh.  2 cm scabbed over lesion on posterior right lower leg       ASSESSMENT/PLAN:   No problem-specific Assessment & Plan notes found for this encounter.  Health Maintenance   Discussed nutrition and exercise. Offered for patient to speak to nutritionist but she declined.  - get Tdap at pharmacy - lab was closed by the end of the visit, patient to return in the next week to get blood work done: CBC to check for anemia, ferritin, Hgb A1c  CKD Patient has known CKD, but Cr increasing- most recently 1.71 on 9/1, up from 1.23 on 8/27. GFR 28 - referral to nephrologist   Skin Concerns Patient concerned about leg sore and skin tag. Sore on leg most likely from an injury vs less likely arterial insufficiency or  malignancy - f/u in 1-2 weeks to remove skin tag and f/u on sore   Insomnia Pt reports taking Valium for 15 years, 1/2 a pill at night for her insomnia. - refill Valium 5mg    Varicose veins - patient has f/u appt with vascular next week   [place]

## 2020-07-17 NOTE — Telephone Encounter (Signed)
Left message for patient to return call.

## 2020-07-20 NOTE — Telephone Encounter (Signed)
Called patient informed her of results.  

## 2020-07-21 ENCOUNTER — Other Ambulatory Visit: Payer: Self-pay

## 2020-07-21 ENCOUNTER — Other Ambulatory Visit: Payer: Medicare Other

## 2020-07-21 DIAGNOSIS — N1831 Chronic kidney disease, stage 3a: Secondary | ICD-10-CM

## 2020-07-21 DIAGNOSIS — E669 Obesity, unspecified: Secondary | ICD-10-CM

## 2020-07-22 ENCOUNTER — Telehealth: Payer: Self-pay

## 2020-07-22 NOTE — Telephone Encounter (Signed)
Patient LVM on nurse line regarding orders being placed for lab work. Patient reports that she waited in the lobby yesterday for over 45 minutes for labs. Patient requesting returned phone call to schedule lab appointment once orders have been placed.   Upon chart review, it seems that orders were placed yesterday afternoon. Attempted to call patient to schedule appointment. No answer, left VM to return call to office to schedule lab visit.   Talbot Grumbling, RN

## 2020-07-24 ENCOUNTER — Other Ambulatory Visit: Payer: Self-pay

## 2020-07-24 ENCOUNTER — Other Ambulatory Visit: Payer: Medicare Other

## 2020-07-24 DIAGNOSIS — E669 Obesity, unspecified: Secondary | ICD-10-CM

## 2020-07-24 DIAGNOSIS — N1831 Chronic kidney disease, stage 3a: Secondary | ICD-10-CM

## 2020-07-25 LAB — FERRITIN: Ferritin: 43 ng/mL (ref 15–150)

## 2020-07-25 LAB — CBC WITH DIFFERENTIAL/PLATELET
Basophils Absolute: 0.1 10*3/uL (ref 0.0–0.2)
Basos: 1 %
EOS (ABSOLUTE): 0.5 10*3/uL — ABNORMAL HIGH (ref 0.0–0.4)
Eos: 7 %
Hematocrit: 37.4 % (ref 34.0–46.6)
Hemoglobin: 12.2 g/dL (ref 11.1–15.9)
Immature Grans (Abs): 0 10*3/uL (ref 0.0–0.1)
Immature Granulocytes: 0 %
Lymphocytes Absolute: 2.5 10*3/uL (ref 0.7–3.1)
Lymphs: 36 %
MCH: 30.2 pg (ref 26.6–33.0)
MCHC: 32.6 g/dL (ref 31.5–35.7)
MCV: 93 fL (ref 79–97)
Monocytes Absolute: 0.6 10*3/uL (ref 0.1–0.9)
Monocytes: 8 %
Neutrophils Absolute: 3.3 10*3/uL (ref 1.4–7.0)
Neutrophils: 48 %
Platelets: 203 10*3/uL (ref 150–450)
RBC: 4.04 x10E6/uL (ref 3.77–5.28)
RDW: 13.4 % (ref 11.7–15.4)
WBC: 7 10*3/uL (ref 3.4–10.8)

## 2020-07-25 LAB — HEMOGLOBIN A1C
Est. average glucose Bld gHb Est-mCnc: 120 mg/dL
Hgb A1c MFr Bld: 5.8 % — ABNORMAL HIGH (ref 4.8–5.6)

## 2020-07-30 ENCOUNTER — Telehealth: Payer: Self-pay | Admitting: *Deleted

## 2020-07-30 ENCOUNTER — Telehealth: Payer: Self-pay

## 2020-07-30 NOTE — Telephone Encounter (Signed)
Patient calls nurse line requesting lab results from 09/17. Please advise.

## 2020-07-30 NOTE — Telephone Encounter (Signed)
Received call from Dr. Pollyann Kennedy nurse at [facility] and they will be contacting patient next week, to schedule an appointment.  Dr. Clover Mealy advised that patient's lisinopril be reduced to 10 mg daily.  Will forward to MD to update her. Kaysey Berndt,CMA

## 2020-07-30 NOTE — Telephone Encounter (Signed)
Patient did not answer, so I left message letting her know. Thank you!

## 2020-08-04 DIAGNOSIS — M1000 Idiopathic gout, unspecified site: Secondary | ICD-10-CM | POA: Diagnosis not present

## 2020-08-04 DIAGNOSIS — N390 Urinary tract infection, site not specified: Secondary | ICD-10-CM | POA: Diagnosis not present

## 2020-08-04 DIAGNOSIS — N184 Chronic kidney disease, stage 4 (severe): Secondary | ICD-10-CM | POA: Diagnosis not present

## 2020-08-04 DIAGNOSIS — I480 Paroxysmal atrial fibrillation: Secondary | ICD-10-CM | POA: Diagnosis not present

## 2020-08-04 DIAGNOSIS — I129 Hypertensive chronic kidney disease with stage 1 through stage 4 chronic kidney disease, or unspecified chronic kidney disease: Secondary | ICD-10-CM | POA: Diagnosis not present

## 2020-08-04 NOTE — Telephone Encounter (Signed)
I left her a voicemail before vacation regarding a medication change. Her labs were normal but I can call her again when I return tomorrow. Thank you!

## 2020-08-05 NOTE — Telephone Encounter (Signed)
Patient returned call to nurse line and informed of below.   Talbot Grumbling, RN

## 2020-08-05 NOTE — Telephone Encounter (Signed)
Thanks Hannah!

## 2020-08-11 ENCOUNTER — Other Ambulatory Visit: Payer: Self-pay | Admitting: Cardiology

## 2020-08-11 ENCOUNTER — Other Ambulatory Visit: Payer: Self-pay | Admitting: Family Medicine

## 2020-08-11 DIAGNOSIS — I5032 Chronic diastolic (congestive) heart failure: Secondary | ICD-10-CM

## 2020-08-11 NOTE — Telephone Encounter (Signed)
75F 93.4 kg Creatinine, Serum 1.710 mg/ 07/08/2020 [id number]/19/21 Pt requesting refill on eliquis 5mg  but they only qualify for eliquis 2.5mg  based on scr and age. Will route to the pharmd pool for further review

## 2020-08-12 NOTE — Telephone Encounter (Signed)
Scr fluctuate between 1.3 and 1.7  Okay to refill Eliquis at 5mg  today, and repeat BMET prior to next refill authorization

## 2020-08-18 DIAGNOSIS — M170 Bilateral primary osteoarthritis of knee: Secondary | ICD-10-CM | POA: Diagnosis not present

## 2020-08-18 HISTORY — PX: OTHER SURGICAL HISTORY: SHX169

## 2020-08-24 ENCOUNTER — Other Ambulatory Visit: Payer: Self-pay | Admitting: Physician Assistant

## 2020-08-25 ENCOUNTER — Encounter: Payer: Self-pay | Admitting: Cardiology

## 2020-08-25 ENCOUNTER — Other Ambulatory Visit: Payer: Self-pay

## 2020-08-25 ENCOUNTER — Ambulatory Visit (INDEPENDENT_AMBULATORY_CARE_PROVIDER_SITE_OTHER): Payer: Medicare Other | Admitting: Cardiology

## 2020-08-25 VITALS — BP 120/80 | HR 88 | Ht 62.0 in | Wt 205.6 lb

## 2020-08-25 DIAGNOSIS — I110 Hypertensive heart disease with heart failure: Secondary | ICD-10-CM | POA: Diagnosis not present

## 2020-08-25 DIAGNOSIS — I4821 Permanent atrial fibrillation: Secondary | ICD-10-CM | POA: Diagnosis not present

## 2020-08-25 DIAGNOSIS — I5033 Acute on chronic diastolic (congestive) heart failure: Secondary | ICD-10-CM | POA: Diagnosis not present

## 2020-08-25 NOTE — Patient Instructions (Signed)
Medication Instructions:  The current medical regimen is effective;  continue present plan and medications.  *If you need a refill on your cardiac medications before your next appointment, please call your pharmacy*  Follow-Up: At [facility], you and your health needs are our priority.  As part of our continuing mission to provide you with exceptional heart care, we have created designated Provider Care Teams.  These Care Teams include your primary Cardiologist (physician) and Advanced Practice Providers (APPs -  Physician Assistants and Nurse Practitioners) who all work together to provide you with the care you need, when you need it.  We recommend signing up for the patient portal called "MyChart".  Sign up information is provided on this After Visit Summary.  MyChart is used to connect with patients for Virtual Visits (Telemedicine).  Patients are able to view lab/test results, encounter notes, upcoming appointments, etc.  Non-urgent messages can be sent to your provider as well.   To learn more about what you can do with MyChart, go to [email or web address].    Your next appointment:   3 month(s)  The format for your next appointment:   In Person  Provider:   Cecilie Kicks, NP   Thank you for choosing [facility]!!

## 2020-08-25 NOTE — Progress Notes (Signed)
Cardiology Office Note:    Date:  08/25/2020   ID:  Kristen [name], DOB 1941-02-18, MRN [id number]  PCP:  Shary Key, DO  [facility] Cardiologist:  [name], MD  [place] Electrophysiologist:  None   Referring MD: Shary Key, DO     History of Present Illness:    Kristen [name] is a 79 y.o. female here for the follow-up of atrial fibrillation, permanent, chronic anticoagulation with Eliquis with chronic diastolic heart failure and essential hypertension.  She was seen last by Dr. Agustin Cree on 06/08/2020 and was doing well without any palpitations chest tightness.  Ankles were more swollen in the morning she stated.  Prior allergic to Flecainide, severe.  Teaches dancing.   2 nights ago SOB, raspy voice.  Increased mucus production.  Seasonal allergies.  Ravenel vein, Dr. Jones Skene. Had ablations at the time. TED hose too tight.   Last year post op chole abx. 1 month in hospital.   Told me story about dance routine in [place].  Past Medical History:  Diagnosis Date  . Abscess of abdominal cavity ([place]) 10/02/2019  . Anticoagulant long-term use    elquis -- managed by cardiology  . Anxiety   . Chronic calculous cholecystitis   . Chronic venous insufficiency    w/  varicose veins  . Depression   . Diastolic CHF, chronic ([place])    followed by cardiology  . Diverticulosis of colon   . Edema of both lower extremities    per pt mostly in summer time  . Essential hypertension, benign   . Fibrocystic breast disease   . Full dentures   . GERD (gastroesophageal reflux disease)    occasional tums and does not eat prior to bedtime  . Gout    08-19-2019  per pt last episode 07/ 2020  . Hiatal hernia   . History of diverticulitis 01/22/2016  . History of recurrent UTIs   . Hx of cholecystectomy 10/02/2019  . OA (osteoarthritis)    knees, lower back  . Permanent atrial fibrillation [facility]) cardiologist--- dr Agustin Cree   first dx 10/ 2011---  histroy DCCV 11-25-2010 by dr Wynonia Lawman (pt's previous cardiologist) and Cardiac cath 12-17-2010 no significant disease  . Scoliosis   . Stress incontinence in female     Past Surgical History:  Procedure Laterality Date  . BILIARY STENT PLACEMENT N/A 09/03/2019   Procedure: BILIARY STENT PLACEMENT;  Surgeon: Clarene Essex, MD;  Location: [facility];  Service: Endoscopy;  Laterality: N/A;  . BLEPHAROPLASTY Bilateral 02-22-2010  dr Georgia Lopes   upper eyelid's  . BUNIONECTOMY Right 2003  . CATARACT EXTRACTION W/ INTRAOCULAR LENS  IMPLANT, BILATERAL  2018  . CHOLECYSTECTOMY N/A 08/22/2019   Procedure: LAPAROSCOPIC CHOLECYSTECTOMY;  Surgeon: [name], [name], MD;  Location: [facility];  Service: General;  Laterality: N/A;  . ERCP N/A 09/03/2019   Procedure: ENDOSCOPIC RETROGRADE CHOLANGIOPANCREATOGRAPHY (ERCP);  Surgeon: Clarene Essex, MD;  Location: [facility];  Service: Endoscopy;  Laterality: N/A;  . ESOPHAGOGASTRODUODENOSCOPY (EGD) WITH PROPOFOL N/A 12/06/2019   Procedure: ESOPHAGOGASTRODUODENOSCOPY (EGD) WITH PROPOFOL;  Surgeon: Clarene Essex, MD;  Location: [facility];  Service: Endoscopy;  Laterality: N/A;  stent removal, ERCP Scope, needs Flouro  . IR SINUS/FIST TUBE CHK-NON GI  09/10/2019  . IR SINUS/FIST TUBE CHK-NON GI  09/24/2019  . KNEE ARTHROSCOPY Left 2002  . REMOVAL OF STONES  09/03/2019   Procedure: REMOVAL OF STONES;  Surgeon: Clarene Essex, MD;  Location: [facility];  Service: Endoscopy;;  .  SPHINCTEROTOMY  09/03/2019   Procedure: SPHINCTEROTOMY;  Surgeon: Clarene Essex, MD;  Location: [facility];  Service: Endoscopy;;  . STENT REMOVAL  12/06/2019   Procedure: STENT REMOVAL;  Surgeon: Clarene Essex, MD;  Location: [facility];  Service: Endoscopy;;  . TUBAL LIGATION Bilateral 1980   AND RIGHT BREAST EXCISION CYST (BENIGN)    Current Medications: Current Meds  Medication Sig  . acetaminophen (TYLENOL) 650 MG CR tablet Take 650-1,300 mg by mouth every 8  (eight) hours as needed for pain.  Marland Kitchen alendronate (FOSAMAX) 10 MG tablet TAKE (1) TABLET BY MOUTH PER WEEK TAKE ON EMPTY STOMACH WITH FULL GLASS OF WATER  . allopurinol (ZYLOPRIM) 100 MG tablet TAKE 1 TABLET BY MOUTH EVERY DAY  . calcium carbonate (TUMS - DOSED IN MG ELEMENTAL CALCIUM) 500 MG chewable tablet Chew 1-2 tablets by mouth at bedtime.   Marland Kitchen CALCIUM PO Take 1 tablet by mouth 2 (two) times daily.  . Carboxymethylcellul-Glycerin (LUBRICATING EYE DROPS OP) Place 1 drop into both eyes daily as needed (dry eyes).  . cholecalciferol (VITAMIN D3) 25 MCG (1000 UT) tablet Take 1,000 Units by mouth daily.  . clindamycin (CLINDAGEL) 1 % gel APPLY TO AFFECTED AREA TWICE A DAY  . colchicine 0.6 MG tablet Take 1 tablet (0.6 mg total) by mouth 2 (two) times daily. Take twice daily until pain resolves and then take as needed for gout flare.  . [employer] ANTI-FUNGAL 2 % powder APPLY TO AFFECTED AREA TWICE A DAY AFTER SKIN RESOLVES FROM KETOCONAZOLE CREAM  . diazepam (VALIUM) 5 MG tablet TAKE 1/2 TABLET (2.5MG  TOTAL) BY MOUTH AT BEDTIME  . ELIQUIS 5 MG TABS tablet TAKE 1 TABLET BY MOUTH TWICE A DAY  . Flax Oil-Fish Oil-Borage Oil (FISH-FLAX-BORAGE) CAPS Take 1 capsule by mouth every other day.   . furosemide (LASIX) 20 MG tablet 20 mg qod alt with 40 mg (Patient taking differently: Take 20 mg by mouth daily. 20 mg every other day alternating with 40 mg every other day)  . glucosamine-chondroitin (GLUCOSAMINE-CHONDROITIN DS) 500-400 MG tablet Take 1 tablet by mouth daily.   Marland Kitchen HYDROcodone-acetaminophen (NORCO/VICODIN) 5-325 MG tablet Take 1 tablet by mouth every 6 (six) hours as needed for moderate pain.  Marland Kitchen lisinopril (ZESTRIL) 10 MG tablet Take 10 mg by mouth daily.  Marland Kitchen loratadine (CLARITIN) 10 MG tablet TAKE 1 TABLET BY MOUTH EVERY DAY  . metoprolol succinate (TOPROL-XL) 100 MG 24 hr tablet TAKE 1 TABLET (100 MG TOTAL) BY MOUTH DAILY. TAKE WITH OR IMMEDIATELY FOLLOWING A MEAL.  . Multiple Vitamins-Minerals  (EMERGEN-C IMMUNE) PACK Take 1 packet by mouth daily.  . Multiple Vitamins-Minerals (MULTIVITAMIN WOMEN 50+) TABS Take 1 tablet by mouth daily.   . mupirocin ointment (BACTROBAN) 2 % APPLY TOPICALLY TO AFFECTED AREA TWICE DAILY  . Nutritional Supplements (ECHINACEA/GOLDEN SEAL PO) Take 1 tablet by mouth daily.   Marland Kitchen nystatin (MYCOSTATIN/NYSTOP) powder Apply 1 application topically 2 (two) times daily.  . pantoprazole (PROTONIX) 40 MG tablet Take 1 tablet (40 mg total) by mouth daily.  . polycarbophil (FIBERCON) 625 MG tablet Take 625 mg by mouth daily.  . potassium chloride (KLOR-CON) 20 MEQ packet USE 1 PACKET BY MOUTH EVERY DAY  . Probiotic Product (PROBIOTIC-10 PO) Take 1 tablet by mouth daily.   Marland Kitchen Propylene Glycol (SYSTANE COMPLETE) 0.6 % SOLN Place 1 drop into both eyes daily.  . RESTASIS 0.05 % ophthalmic emulsion INSTILL 1 DROP INTO BOTH EYES TWICE A DAY     Allergies:   Flecainide  acetate   Social History   Socioeconomic History  . Marital status: Widowed    Spouse name: [name]  . Number of children: 1  . Years of education: Masters  . Highest education level: Master's degree (e.g., MA, MS, MEng, MEd, MSW, MBA)  Occupational History  . Occupation: Retired- Product manager: RETIRED  Tobacco Use  . Smoking status: Never Smoker  . Smokeless tobacco: Never Used  Vaping Use  . Vaping Use: Never used  Substance and Sexual Activity  . Alcohol use: Yes    Comment: rarely  . Drug use: Never  . Sexual activity: Not Currently    Birth control/protection: Post-menopausal  Other Topics Concern  . Not on file  Social History Narrative   Emergency Contact: Leta Baptist [phone]   Who lives with you: self   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 Exercise: Pt dances regularly for shows   Seatbelts: Pt reports wearing seatbelt when in vehicles.    Sun Exposure/Protection: Pt reports not being in the  sun very much   Hobbies: dancing, painting,    Working smoke alarm: yes   Home throw rugs: yes   Home free from clutter: yes   ______________________________________________________________________________________   Current Social History     Who lives at home: lives alone, retired Education officer, museum and librarian  10/25/2019    Transportation: provided by [facility] Medicare and her female friend 10/25/2019   Important Relationships & Pets: has cats and a friend that she talks to or sees daily.  Reports no family local 10/25/2019    Current Stressors: clutter in her home 10/25/2019   Other: Unable to prepare meals and needs help with keeping her home clean.  Describes house as being cluttered with narrow paths in her trailer.  No cental heat uses space heaters for the past year. Leaking kitchen causes water bill to be higher than it should be 10/25/2019   Casimer Lanius, LCSW   Clinical Social Worker                                                                                                         Social Determinants of Health   Financial Resource Strain:   . Difficulty of Paying Living Expenses: Not on file  Food Insecurity: Food Insecurity Present  . Worried About Charity fundraiser in the Last Year: Sometimes true  . Ran Out of Food in the Last Year: Sometimes true  Transportation Needs: No Transportation Needs  . Lack of Transportation (Medical): No  . Lack of Transportation (Non-Medical): No  Physical Activity:   . Days of Exercise per Week: Not on file  . Minutes of Exercise per Session: Not on file  Stress:   . Feeling of Stress : Not on file  Social Connections:   . Frequency of Communication with Friends and Family: Not on file  . Frequency of Social Gatherings with Friends and Family: Not on file  .  Attends Religious Services: Not on file  . Active Member of Clubs or Organizations: Not on file  . Attends Archivist Meetings: Not on file  . Marital Status: Not on  file     Family History: The patient's family history includes Alcohol abuse in her father; Cancer in her maternal aunt and mother; Heart disease in her maternal uncle; Kidney disease in her father.  ROS:   Please see the history of present illness.     All other systems reviewed and are negative.  EKGs/Labs/Other Studies Reviewed:    The following studies were reviewed today: Echo normal EF  EKG:  EKG is  ordered today.  The ekg ordered today demonstrates atrial fibrillation 88  Recent Labs: 08/28/2019: B Natriuretic Peptide 310.5 09/16/2019: ALT 16; Magnesium 2.3 03/17/2020: TSH 3.060 06/08/2020: NT-Pro BNP 780 07/08/2020: BUN 53; Creatinine, Ser 1.71; Potassium 3.7; Sodium 138 07/24/2020: Hemoglobin 12.2; Platelets 203  Recent Lipid Panel    Component Value Date/Time   CHOL 224 (H) 06/09/2014 1427   TRIG 142 06/09/2014 1427   HDL 64 06/09/2014 1427   CHOLHDL 3.5 06/09/2014 1427   VLDL 28 06/09/2014 1427   LDLCALC 132 (H) 06/09/2014 1427         Physical Exam:    VS:  BP 120/80   Pulse 88   Ht 5\' 2"  (1.575 m)   Wt 205 lb 9.6 oz (93.3 kg)   SpO2 91%   BMI 37.60 kg/m     Wt Readings from Last 3 Encounters:  08/25/20 205 lb 9.6 oz (93.3 kg)  07/16/20 206 lb (93.4 kg)  07/03/20 201 lb (91.2 kg)     GEN:  Well nourished, well developed in no acute distress HEENT: Normal, kyphosis noted with her gait NECK: No JVD; No carotid bruits LYMPHATICS: No lymphadenopathy CARDIAC: Irregularly irregular, no murmurs, rubs, gallops RESPIRATORY:  Clear to auscultation without rales, wheezing or rhonchi  ABDOMEN: Soft, non-tender, non-distended MUSCULOSKELETAL: Mild to moderate edema; No deformity  SKIN: Warm and dry NEUROLOGIC:  Alert and oriented x 3 PSYCHIATRIC:  Normal affect   ASSESSMENT:    1. Permanent atrial fibrillation ([place])   2. Hypertensive heart disease with acute on chronic diastolic congestive heart failure (HCC)    PLAN:    In order of problems listed  above:  Permanent atrial fibrillation -Continue with rate control with current anticoagulation.  No bleeding issues no problems with medications.  Continue to monitor hemoglobin and creatinine.  Chronic diastolic heart failure -Has had trouble in the past with lower extremity edema.  She has had history of gout before with diuresis.  Conservative efforts have been utilized such as leg elevation and avoiding salty food. -Creatinine has ranged from 1.23-1.71.  Had to be careful with diuresis.  Continue with current furosemide dose as directed by nephrology.  Excellent.  Asked her to try Mucinex to help her with her mucus production at times.  Chronic kidney disease stage IIIb -Seeing nephrology.  Lasix dose currently unchanged.  Essential hypertension -Doing well with blood pressure well controlled.     Medication Adjustments/Labs and Tests Ordered: Current medicines are reviewed at length with the patient today.  Concerns regarding medicines are outlined above.  Orders Placed This Encounter  Procedures  . EKG 12-Lead   No orders of the defined types were placed in this encounter.   Patient Instructions  Medication Instructions:  The current medical regimen is effective;  continue present plan and medications.  *If you need a  refill on your cardiac medications before your next appointment, please call your pharmacy*  Follow-Up: At [facility], you and your health needs are our priority.  As part of our continuing mission to provide you with exceptional heart care, we have created designated Provider Care Teams.  These Care Teams include your primary Cardiologist (physician) and Advanced Practice Providers (APPs -  Physician Assistants and Nurse Practitioners) who all work together to provide you with the care you need, when you need it.  We recommend signing up for the patient portal called "MyChart".  Sign up information is provided on this After Visit Summary.  MyChart is used  to connect with patients for Virtual Visits (Telemedicine).  Patients are able to view lab/test results, encounter notes, upcoming appointments, etc.  Non-urgent messages can be sent to your provider as well.   To learn more about what you can do with MyChart, go to [email or web address].    Your next appointment:   3 month(s)  The format for your next appointment:   In Person  Provider:   Cecilie Kicks, NP   Thank you for choosing [facility]!!         Signed, [name], MD  08/25/2020 2:54 PM    [place]

## 2020-09-01 DIAGNOSIS — I8311 Varicose veins of right lower extremity with inflammation: Secondary | ICD-10-CM | POA: Diagnosis not present

## 2020-09-01 DIAGNOSIS — I8312 Varicose veins of left lower extremity with inflammation: Secondary | ICD-10-CM | POA: Diagnosis not present

## 2020-09-01 DIAGNOSIS — I87323 Chronic venous hypertension (idiopathic) with inflammation of bilateral lower extremity: Secondary | ICD-10-CM | POA: Diagnosis not present

## 2020-09-01 DIAGNOSIS — R600 Localized edema: Secondary | ICD-10-CM | POA: Diagnosis not present

## 2020-09-03 ENCOUNTER — Other Ambulatory Visit: Payer: Self-pay

## 2020-09-03 ENCOUNTER — Ambulatory Visit (INDEPENDENT_AMBULATORY_CARE_PROVIDER_SITE_OTHER): Payer: Medicare Other | Admitting: Family Medicine

## 2020-09-03 VITALS — BP 130/70 | HR 70 | Ht 62.0 in | Wt 201.6 lb

## 2020-09-03 DIAGNOSIS — L570 Actinic keratosis: Secondary | ICD-10-CM | POA: Diagnosis not present

## 2020-09-03 DIAGNOSIS — L821 Other seborrheic keratosis: Secondary | ICD-10-CM | POA: Diagnosis not present

## 2020-09-03 MED ORDER — TRIAMCINOLONE ACETONIDE 0.1 % EX CREA: 1.0000 "application " | TOPICAL_CREAM | Freq: Two times a day (BID) | CUTANEOUS | 0 refills | Status: DC

## 2020-09-03 NOTE — Patient Instructions (Signed)
Actinic Keratosis °An actinic keratosis is a precancerous growth on the skin. If there is more than one growth, the condition is called actinic keratoses. Actinic keratoses appear most often on areas of skin that get a lot of sun exposure, including the scalp, face, ears, lips, upper back, forearms, and the backs of the hands. °If left untreated, these growths may develop into a skin cancer called squamous cell carcinoma. It is important to have all these growths checked by a health care provider to determine the best treatment approach. °What are the causes? °Actinic keratoses are caused by getting too much ultraviolet (UV) radiation from the sun or other UV light sources. °What increases the risk? °You are more likely to develop this condition if you: °· Have light-colored skin and blue eyes. °· Have blond or red hair. °· Spend a lot of time in the sun. °· Do not protect your skin from the sun when outdoors. °· Are an older person. The risk of developing an actinic keratosis increases with age. °What are the signs or symptoms? °Actinic keratoses feel like scaly, rough spots of skin. Symptoms of this condition include growths that may: °· Be as small as a pinhead or as big as a quarter. °· Itch, hurt, or feel sensitive. °· Be skin-colored, light tan, dark tan, pink, or a combination of any of these colors. In most cases, the growths become red. °· Have a small piece of pink or gray skin (skin tag) growing from them. °It may be easier to notice actinic keratoses by feeling them, rather than seeing them. Sometimes, actinic keratoses disappear, but many reappear a few days to a few weeks later. °How is this diagnosed? °This condition is usually diagnosed with a physical exam. °· A tissue sample may be removed from the actinic keratosis and examined under a microscope (biopsy). °How is this treated? °If needed, this condition may be treated by: °· Scraping off the actinic keratosis (curettage). °· Freezing the actinic  keratosis with liquid nitrogen (cryosurgery). This causes the growth to eventually fall off the skin. °· Applying medicated creams or gels to destroy the cells in the growth. °· Applying chemicals to the actinic keratosis to make the outer layers of skin peel off (chemical peel). °· Using photodynamic therapy. In this procedure, medicated cream is applied to the actinic keratosis. This cream increases your skin's sensitivity to light. Then, a strong light is aimed at the actinic keratosis to destroy cells in the growth. °Follow these instructions at home: °Skin care °· Apply cool, wet cloths (cool compresses) to the affected areas. °· Do not scratch your skin. °· Check your skin regularly for any growths, especially growths that: °? Start to itch or bleed. °? Change in size, shape, or color. °Caring for the treated area °· Keep the treated area clean and dry as told by your health care provider. °· Do not apply any medicine, cream, or lotion to the treated area unless your health care provider tells you to do that. °· Do not pick at blisters or try to break them open. This can cause infection and scarring. °· If you have red or irritated skin after treatment, follow instructions from your health care provider about how to take care of the treated area. Make sure you: °? Wash your hands with soap and water before you change your bandage (dressing). If soap and water are not available, use hand sanitizer. °? Change your dressing as told by your health care provider. °· If   you have red or irritated skin after treatment, check your treated area every day for signs of infection. Check for: °? Redness, swelling, or pain. °? Fluid or blood. °? Warmth. °? Pus or a bad smell. °General instructions °· Take or apply over-the-counter and prescription medicines only as told by your health care provider. °· Return to your normal activities as told by your health care provider. Ask your health care provider what activities are  safe for you. °· Have a skin exam done every year by a health care provider who is a skin specialist (dermatologist). °· Keep all follow-up visits as told by your health care provider. This is important. °Lifestyle °· Do not use any products that contain nicotine or tobacco, such as cigarettes and e-cigarettes. If you need help quitting, ask your health care provider. °· Take steps to protect your skin from the sun. °? Try to avoid the sun between 10:00 a.m. and 4:00 p.m. This is when the UV light is the strongest. °? Use a sunscreen or sunblock with SPF 30 (sun protection factor 30) or greater. °? Apply sunscreen before you are exposed to sunlight and reapply as often as directed by the instructions on the sunscreen container. °? Always wear sunglasses that have UV protection, and always wear a hat and clothing to protect your skin from sunlight. °? When possible, avoid medicines that increase your sensitivity to sunlight. °? Do not use tanning beds or other indoor tanning devices. °Contact a health care provider if: °· You notice any changes or new growths on your skin. °· You have swelling, pain, or more redness around your treated area. °· You have fluid or blood coming from your treated area. °· Your treated area feels warm to the touch. °· You have pus or a bad smell coming from your treated area. °· You have a fever. °· You have a blister that becomes large and painful. °Summary °· An actinic keratosis is a precancerous growth on the skin. If there is more than one growth, the condition is called actinic keratoses. In some cases, if left untreated, these growths can develop into skin cancer. °· Check your skin regularly for any growths, especially growths that start to itch or bleed, or change in size, shape, or color. °· Take steps to protect your skin from the sun. °· Contact a health care provider if you notice any changes or new growths on your skin. °· Keep all follow-up visits as told by your health  care provider. This is important. °This information is not intended to replace advice given to you by your health care provider. Make sure you discuss any questions you have with your health care provider. °Document Revised: 03/06/2018 Document Reviewed: 03/06/2018 °Elsevier Patient Education © 2020 [employer] Inc[employer] ° °

## 2020-09-03 NOTE — Progress Notes (Signed)
  CC: Skin lesions.  HPI:  Skin lesions: Patient here to f/u with a brownish lesion on her left inner thigh which started about a year ago. She felt it had changed in color but no change in size.  She also has a place inferior and lateral to the brownish lesion, which is scaly, itchy, and painful when she touches it. She got that area cryoed by her dermatologist months earlier, and since then, she has had that symptom.   Another lesion on her chest.  C/O another new spot on the back of her right Achilles, which she noticed about three months ago. It is not painful or itchy but bothers her that it is there.  Bruising/Blue skin: She endorses hx of varicose vein/spider vein. New area on her right LL anteriorly, which she noticed a few days ago. She was seen by her vascular specialist recently, and they told her it looked like bruising. She does not believe this is so since she did not injure this area. She is on a blood thinner.  Physical Exam Vitals and nursing note reviewed.  Pulmonary:     Effort: No respiratory distress.  Skin:    Findings: Bruising present.     Comments: Small, <1 mm, raised, brownish lesion on her left inner thigh similar to previous picture. An area inferior and lateral to the above lesion which is smaller, and scaly.  An area of bluish discoloration of her right anterior shin approximately 3 cm by 4 cm and multiple spider veins B/L.  She has a small, flat,scaly brownish lesion, <0.5 mm a few inches above her right achilles.  Slightly raised, hyperpigmented, scaly lesion on her chest, similar to her thigh        A/P:  Left thigh SK x 2: Chest lesion and large raised lesion on her left inner thigh. The small scaly flat, lesion on her inner thigh is likely AK. Excision biopsy vs Cryotherapy discussed. She opted for Cryo. We froze the lesion on her chest and the larger lesion on her thigh. Defer freezing the smaller lesion till she get some relieve from  itching and pain. Trial of topical triamcinolone recommended for symptom relieve. See procedure note below.  Right Achilles/posterior lower leg lesion: Differentials are AK or SCC We opted to monitor for now, if she notices any changes we will excise in the future. F/U in 4 weeks. I will contact her to schedule f/u appointment, since there is no openings today. She agreed with the plan.  Bruising: I reassured her that this is benign and likely due to her Eliquis use. Monitor closely for improvement. F/U soon if worsening.  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 Carollee Leitz  Supervising Physician (if applicable):Ammanda Dobbins Gwendlyn Deutscher, MD, MPH  PROCEDURE: Written and verbal informed consent obtained.  The area surrounding the skin lesion was prepared in the  usual sterile manner. The cryotherapy gun was then applied for 3 seconds until an ice ball formed with a 5-7 mm border.  This was allowed to thaw and then the cryotherapy was again applied for 3 seconds to an ice ball of 5-7 mm three times altogether.  Closure: None  Followup: The patient tolerated the procedure well without  complications.  Standard post-procedure care is explained and return  precautions are given.

## 2020-09-04 ENCOUNTER — Ambulatory Visit: Payer: Medicare Other | Admitting: Cardiology

## 2020-09-08 DIAGNOSIS — I8312 Varicose veins of left lower extremity with inflammation: Secondary | ICD-10-CM | POA: Diagnosis not present

## 2020-09-11 ENCOUNTER — Other Ambulatory Visit: Payer: Self-pay | Admitting: Cardiology

## 2020-09-14 DIAGNOSIS — N184 Chronic kidney disease, stage 4 (severe): Secondary | ICD-10-CM | POA: Diagnosis not present

## 2020-09-17 ENCOUNTER — Other Ambulatory Visit: Payer: Self-pay

## 2020-09-17 MED ORDER — FUROSEMIDE 20 MG PO TABS
ORAL_TABLET | ORAL | 2 refills | Status: DC
Start: 2020-09-17 — End: 2021-07-23

## 2020-09-17 NOTE — Telephone Encounter (Signed)
Pt calling requesting a refill on furosemide 20 mg tablet. Pt states that she takes 2 tablets every other day and pt is out of medication. This medication is not prescribed this way. Would Dr. Marlou Porch like to refill this medication as pt is requesting, 2 tablets every other day? Please address

## 2020-09-21 ENCOUNTER — Other Ambulatory Visit: Payer: Self-pay | Admitting: Family Medicine

## 2020-09-21 ENCOUNTER — Other Ambulatory Visit: Payer: Self-pay | Admitting: Cardiology

## 2020-09-21 ENCOUNTER — Other Ambulatory Visit: Payer: Self-pay

## 2020-09-21 ENCOUNTER — Telehealth: Payer: Self-pay | Admitting: Cardiology

## 2020-09-21 ENCOUNTER — Telehealth: Payer: Self-pay | Admitting: *Deleted

## 2020-09-21 MED ORDER — METOPROLOL SUCCINATE ER 100 MG PO TB24: 100.0000 mg | ORAL_TABLET | Freq: Every day | ORAL | 2 refills | Status: DC

## 2020-09-21 NOTE — Telephone Encounter (Signed)
Pt requests 5mg  eliquis but only qualifies for 2.5 based on dosing guidelines... will route to pharmd pool for review 26f 91.4kg Scr 1.71 07/08/20 Lovw/skains 08/25/20

## 2020-09-21 NOTE — Telephone Encounter (Signed)
°*  STAT* If patient is at the pharmacy, call can be transferred to refill team.   1. Which medications need to be refilled? (please list name of each medication and dose if known)  metoprolol succinate (TOPROL-XL) 100 MG 24 hr tablet  2. Which pharmacy/location (including street and city if local pharmacy) is medication to be sent to? [id number] - , [place] - [address]  3. Do they need a 30 day or 90 day supply? 30 day supply, lost bottle of medication in the process of moving things.

## 2020-09-21 NOTE — Telephone Encounter (Signed)
   Primary Cardiologist: [name], MD  Chart reviewed as part of pre-operative protocol coverage. Pt was doing well on cardiac stand point when last seen by Dr. Marlou Porch 08/25/20. Given past medical history and time since last visit, based on ACC/AHA guidelines, Kristen [name] would be at acceptable risk for the planned procedure without further cardiovascular testing.   [place], [place] 09/21/2020, 3:02 PM

## 2020-09-21 NOTE — Telephone Encounter (Signed)
Patient with diagnosis of atrial fibrillation on Eliquis for anticoagulation.    Procedure: D&C Conization Date of procedure: TBD  CHA2DS2-VASc Score = 5  This indicates a 7.2% annual risk of stroke. The patient's score is based upon: CHF History: 1 HTN History: 1 Diabetes History: 0 Stroke History: 0 Vascular Disease History: 0 Age Score: 2 Gender Score: 1  CrCl 37.9 ml/min (Scr 1.71) Platelet count 203  Per office protocol, patient can hold Eliquis for 1-2 days prior to procedure.

## 2020-09-21 NOTE — Telephone Encounter (Signed)
   [facility] Pre-operative Risk Assessment    HEARTCARE STAFF: - Please ensure there is not already an duplicate clearance open for this procedure. - Under Visit Info/Reason for Call, type in Other and utilize the format Clearance MM/DD/YY or Clearance TBD. Do not use dashes or single digits. - If request is for dental extraction, please clarify the # of teeth to be extracted.  Request for surgical clearance:  1. What type of surgery is being performed? D&C CONIZATION    2. When is this surgery scheduled? TBD   3. What type of clearance is required (medical clearance vs. Pharmacy clearance to hold med vs. Both)? BOTH  4. Are there any medications that need to be held prior to surgery and how long? ELIQUIS PRE AND POST PROCEDURE DIRECTIONS  5. Practice name and name of physician performing surgery? [facility]; DR. Marcello Moores [place]   6. What is the office phone number? [phone]   7.   What is the office fax number? [place]  8.   Anesthesia type (None, local, MAC, general) ? NOT LISTED   Julaine Hua 09/21/2020, 1:56 PM  _________________________________________________________________   (provider comments below)

## 2020-09-22 NOTE — Telephone Encounter (Signed)
Per last refill not. Patient due to repeat BMET prior to next refill authorization.  Please call patient with reminder.

## 2020-09-22 NOTE — Telephone Encounter (Signed)
lmomed the pt that labs are needed and asked them to call us and let us know how they plan to do them so I know how to place the order for a cmp clinic collect or lab collect depending on which office that they choose to go to

## 2020-09-25 IMAGING — DX DG CHEST 1V PORT
1 series · 1 of 1 positions shown · non-contrast
Comparison: 08/28/2019

CLINICAL DATA: Short of breath

EXAM:
PORTABLE CHEST 1 VIEW

[chest ap]
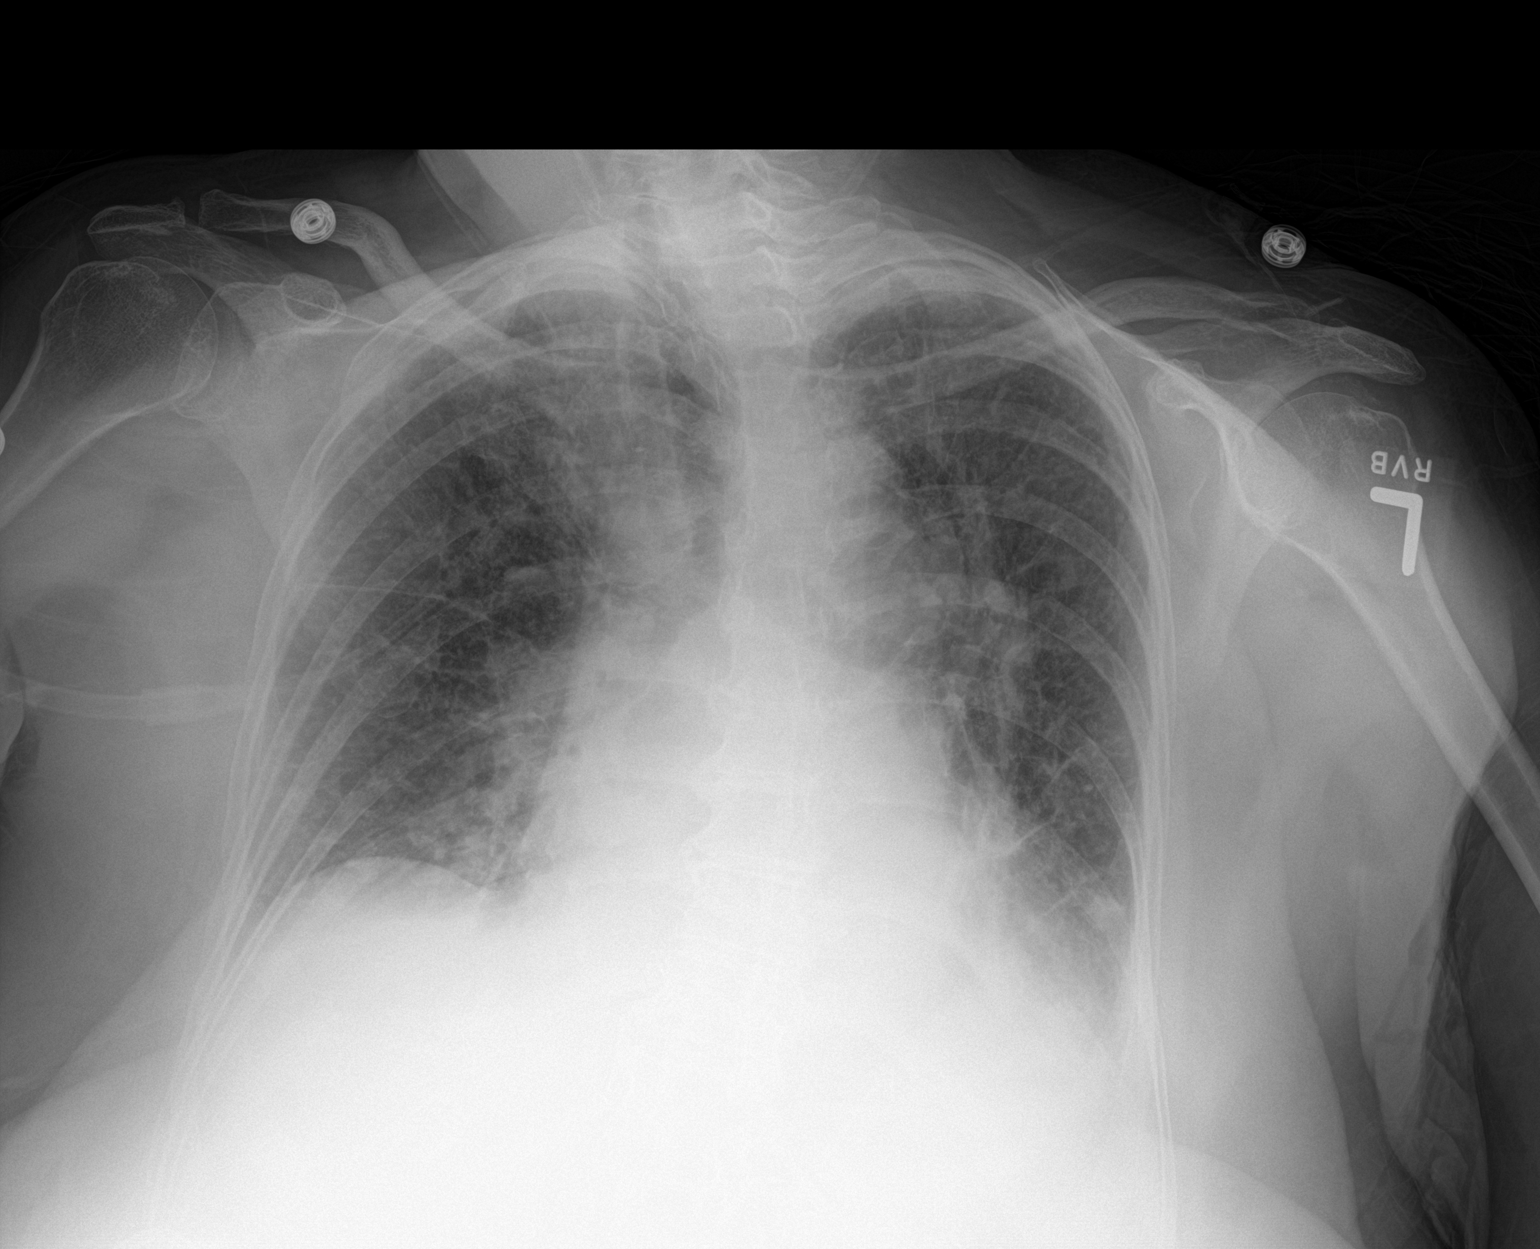

[1 of 1 positions shown; findings below may reference images not displayed]

FINDINGS: Small pleural effusions. Cardiomegaly with vascular congestion and
diffuse interstitial and hazy pulmonary edema. No pneumothorax.
IMPRESSION: Mild cardiomegaly with vascular congestion and mild pulmonary edema.
There are suspected small pleural effusions. Left basilar
atelectasis or pneumonia

## 2020-09-28 DIAGNOSIS — M1000 Idiopathic gout, unspecified site: Secondary | ICD-10-CM | POA: Diagnosis not present

## 2020-09-28 DIAGNOSIS — I480 Paroxysmal atrial fibrillation: Secondary | ICD-10-CM | POA: Diagnosis not present

## 2020-09-28 DIAGNOSIS — I129 Hypertensive chronic kidney disease with stage 1 through stage 4 chronic kidney disease, or unspecified chronic kidney disease: Secondary | ICD-10-CM | POA: Diagnosis not present

## 2020-09-28 DIAGNOSIS — N184 Chronic kidney disease, stage 4 (severe): Secondary | ICD-10-CM | POA: Diagnosis not present

## 2020-09-30 ENCOUNTER — Other Ambulatory Visit: Payer: Self-pay | Admitting: Family Medicine

## 2020-09-30 DIAGNOSIS — F411 Generalized anxiety disorder: Secondary | ICD-10-CM

## 2020-10-08 ENCOUNTER — Ambulatory Visit: Payer: Medicare Other

## 2020-10-13 IMAGING — XA IR FISTULA/SINUS TRACT
2 series · 7 of 7 positions shown · non-contrast
Comparison: Fluoroscopic guided drainage catheter
injection-09/10/2019;

CLINICAL DATA: History of cholecystectomy complicated by bile leak
(confirmed on nuclear medicine HIDA scan performed 08/29/2019)
necessitating placement of a drainage catheter into a gallbladder
fossa fluid collection on 08/30/2019 (Dr. Selles). The patient
subsequently underwent ERCP and placement of internal biliary stent
on 09/03/2019.
TECHNIQUE: The patient was positioned supine on the fluoroscopy table.

[Series 1: care single · 1 of 1 slices shown]
[im 1/1]
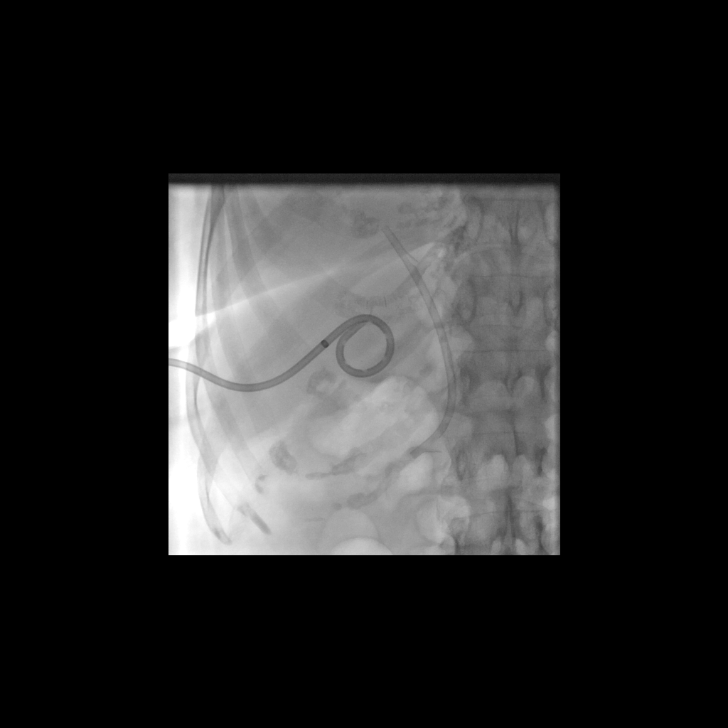

[Series 300: tube placements · 6 of 6 slices shown]
[im 1/6]
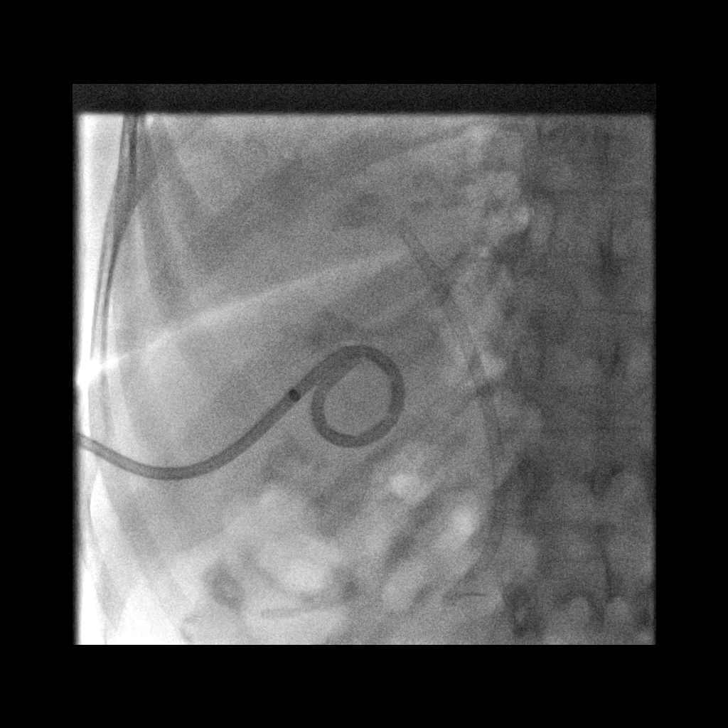
[im 2/6]
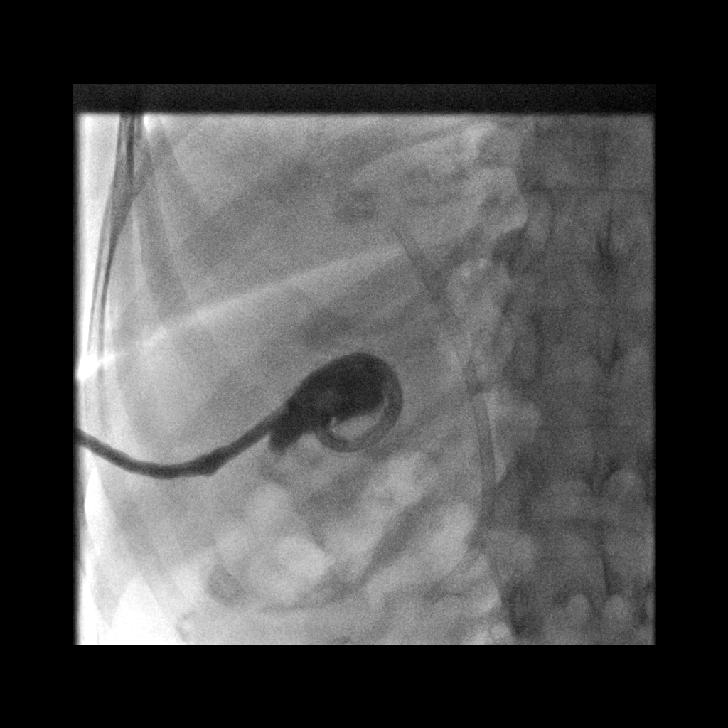
[im 3/6]
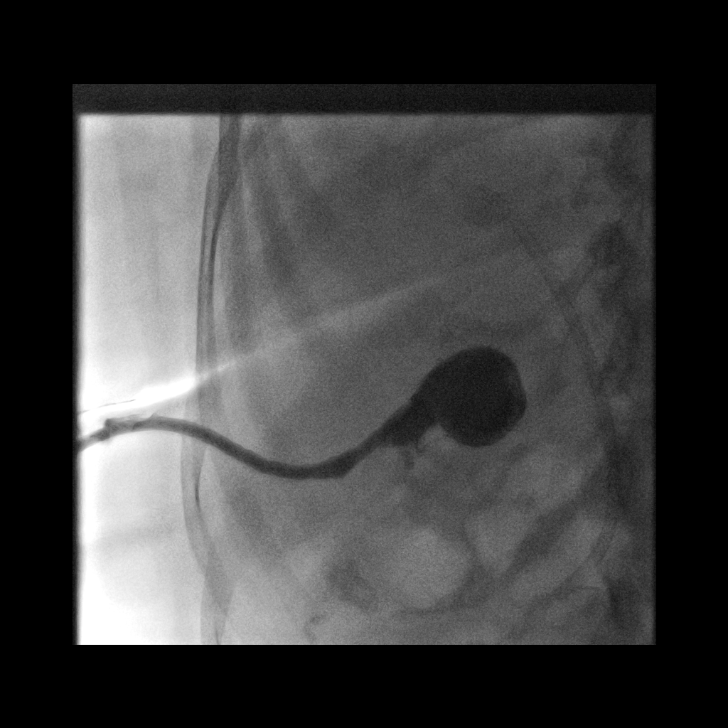
[im 4/6]
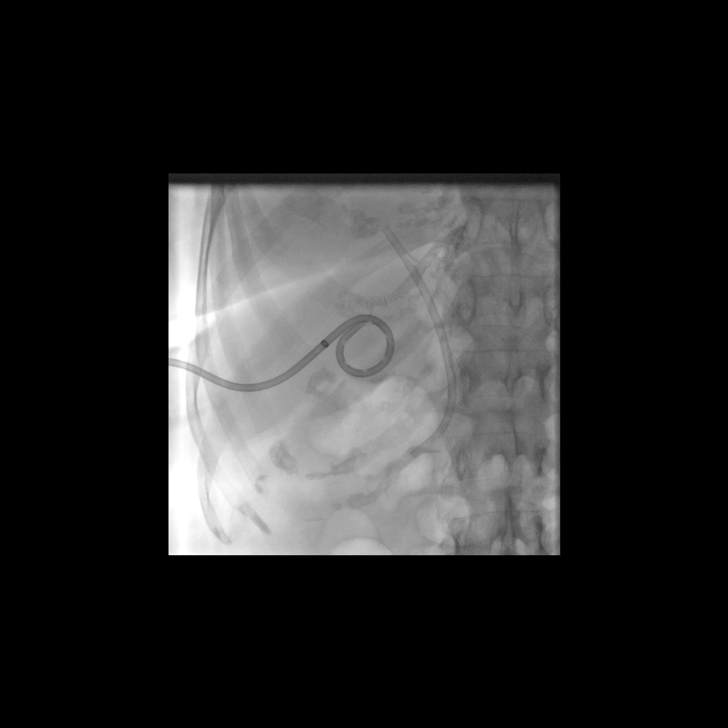
[im 5/6]
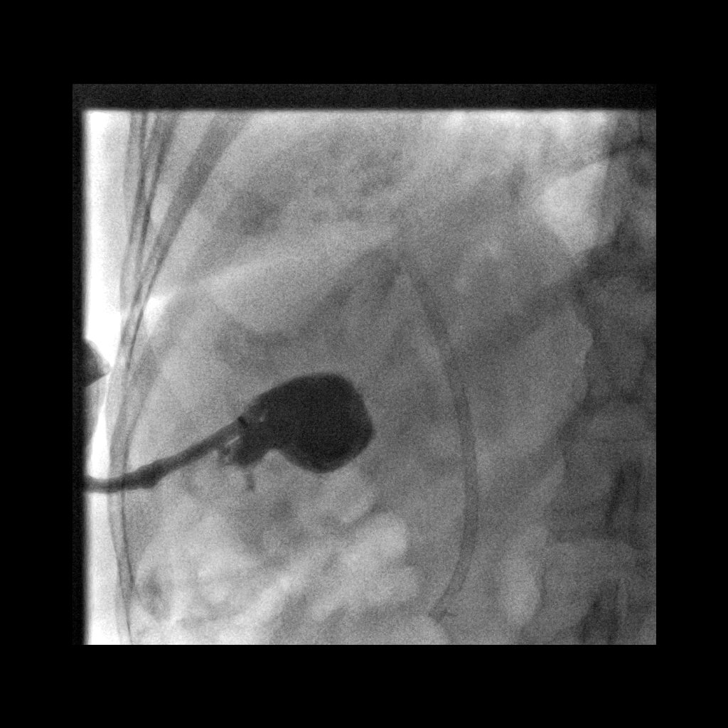
[im 6/6]
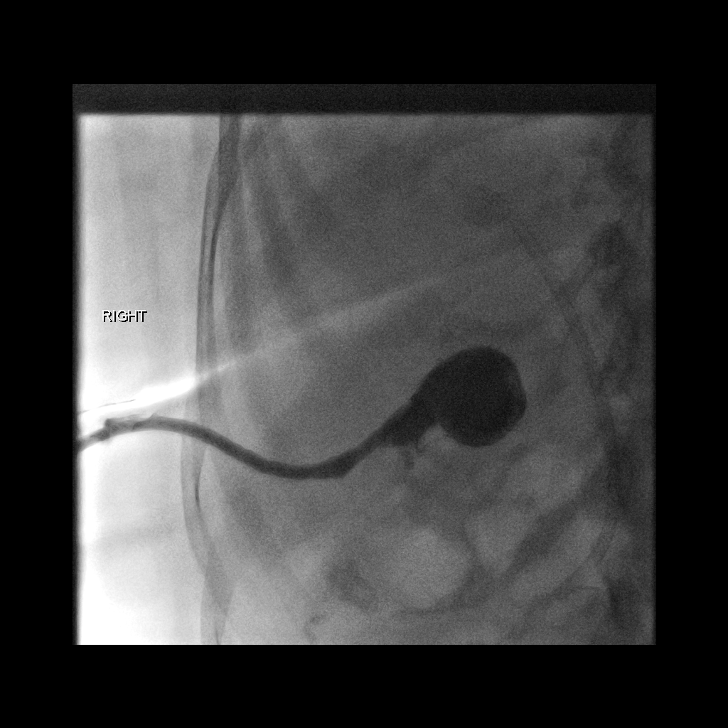

[7 of 7 positions shown; findings below may reference images not displayed]

CT scan of the abdomen and pelvis performed 09/10/2019 demonstrates
resolution of the gallbladder fossa fluid collection, however
subsequent drainage catheter injection (also performed 09/10/2019)
demonstrated persistent communication between the decompressed
gallbladder fossa abscess/fluid collection and a branch of the right
biliary tree.

As such, the drainage catheter was converted from a JP bulb to a
gravity bag at that time.

Patient reports no output from the percutaneous catheter for the
past several days and returns today for drainage catheter injection.

EXAM:
SINUS TRACT INJECTION/FISTULOGRAM
CT guided percutaneous drainage catheter
placement-08/30/2019; CT abdomen pelvis-09/10/2019; 08/29/2019;
nuclear medicine HIDA scan-08/29/2019

CONTRAST:  10mL OMNIPAQUE IOHEXOL 300 MG/ML SOLN - administered via
the existing percutaneous drainage catheter

FLUOROSCOPY TIME:  48 seconds (27 mGy)
A preprocedural spot fluoroscopic image was obtained of the right
upper abdominal quadrant and existing percutaneous drainage
catheter.

Multiple spot fluoroscopic and radiographic images were obtained
following the injection of a small amount of contrast via the
existing percutaneous drainage catheter.

Images reviewed and the decision was made to remove the percutaneous
drainage catheter. External portion of the drainage catheter was cut
and drainage catheters removed intact. A dressing was applied. The
patient tolerated the procedure well without immediate
postprocedural complication.
FINDINGS: Preprocedural spot fluoroscopic image demonstrates unchanged
positioning of gallbladder fossa percutaneous drainage catheter
overlying the right upper abdominal quadrant. Note is also made of
an internal biliary stent overlying expected location of the CBD

Contrast injection demonstrates opacification of the decompressed
gallbladder fossa fluid collection with reflux of contrast along the
catheter tract to the skin entrance site. There is no definitive
persistent communication to the right intrahepatic biliary tree as
was demonstrated on the 09/10/2019 examination. As such, the
percutaneous drainage catheter was removed at the patient's bedside
without incident.
IMPRESSION: Drainage catheter injection demonstrates opacification of the
decompressed gallbladder fossa fluid collection without evidence of
residual bile leak.

Given resolution of the bile leak on today's injection as well as
lack of any output from the drainage catheter for the past several
days, the drainage catheter was removed at the patient's bedside
without incident.

PLAN:
The patient is to maintain all previously scheduled follow-up
appointments with referring surgeon, Dr. Trisha, as well as
referring gastroenterologist, Dr. Mederic.

## 2020-10-15 ENCOUNTER — Other Ambulatory Visit: Payer: Self-pay | Admitting: Cardiology

## 2020-10-15 DIAGNOSIS — I4821 Permanent atrial fibrillation: Secondary | ICD-10-CM

## 2020-10-15 NOTE — Telephone Encounter (Signed)
Refill request sent to Coumadin pool

## 2020-10-15 NOTE — Telephone Encounter (Signed)
51f, 91.4 kg, scr 1.71 (07/08/20), lovw/skains (09/21/20). Pt requests refill of eliquis 5mg  but based on dosing guidelines 2.5 is recommended. Will route to pharmd pool for assessment.

## 2020-10-15 NOTE — Telephone Encounter (Signed)
Patient Scr 1.05 on 09/14/20 per [facility].  Will continue Eliquis 5 mg BID

## 2020-10-21 ENCOUNTER — Other Ambulatory Visit: Payer: Self-pay

## 2020-10-22 MED ORDER — TRIAMCINOLONE ACETONIDE 0.1 % EX CREA
TOPICAL_CREAM | CUTANEOUS | 0 refills | Status: DC
Start: 2020-10-22 — End: 2021-03-15

## 2020-10-26 ENCOUNTER — Other Ambulatory Visit: Payer: Self-pay | Admitting: Obstetrics and Gynecology

## 2020-10-27 ENCOUNTER — Other Ambulatory Visit: Payer: Self-pay

## 2020-10-27 ENCOUNTER — Ambulatory Visit (INDEPENDENT_AMBULATORY_CARE_PROVIDER_SITE_OTHER): Payer: Medicare Other | Admitting: Family Medicine

## 2020-10-27 ENCOUNTER — Encounter: Payer: Self-pay | Admitting: Family Medicine

## 2020-10-27 VITALS — BP 122/68 | HR 87 | Wt 207.0 lb

## 2020-10-27 DIAGNOSIS — Z23 Encounter for immunization: Secondary | ICD-10-CM

## 2020-10-27 DIAGNOSIS — L989 Disorder of the skin and subcutaneous tissue, unspecified: Secondary | ICD-10-CM | POA: Diagnosis not present

## 2020-10-27 NOTE — Progress Notes (Deleted)
    SUBJECTIVE:   CHIEF COMPLAINT / HPI:   Patient presents to follow up on skin lesions. She wanted to be seen sooner than 1/22 when derm clinic had availability   On 10/28 she had cryotherapy for potentially AKs on her chest and thighs. AKs on her right achilles and posterior lower leg were recommended to be monitored.   Lesions removed appear to be healing well. She denies any pain or itching of those sites. She does point out 2 small lesions on her back that she wanted checked out. States she occasionally uses a powder to keep area dry. Denies pain and itching   PERTINENT  PMH / PSH:  Skin tags, SK, AK  OBJECTIVE:   There were no vitals taken for this visit.   Physical exam General: well appearing, NAD Cardiovascular: RRR, no murmurs Lungs: CTAB. Normal WOB Abdomen: soft, non-distended, non-tender Skin: warm, dry. No edema ***  ASSESSMENT/PLAN:   No problem-specific Assessment & Plan notes found for this encounter.    Health Maintenance  - flu vaccine - COVID booster    [place]

## 2020-10-27 NOTE — Patient Instructions (Signed)
It was great seeing you today! Today we followed up on your skin lesions which I am glad to see have healed well! We will monitor the other lesions on your mid back area, but if you decide you would like anything removed you can schedule an appointment with the dermatology clinic.   Today you also received your COVID booster and flu vaccine.    Feel free to call with any questions or concerns at any time, at [phone].  Happy holidays!    Take care,  Dr. Shary Key [facility]

## 2020-10-27 NOTE — Progress Notes (Signed)
    SUBJECTIVE:   CHIEF COMPLAINT / HPI:   Patient presents to follow up on skin lesions she previously had removed.   On 10/28 she had cryotherapy for AKs on her chest and thighs. AKs on her right achilles and posterior lower leg were recommended to be monitored.   Lesions removed appear to be healing well without signs of infection. She denies any pain or itching of those sites. She does point out 2 small lesions on her back that she wanted checked out. States she occasionally uses an anti fungal powder to keep area dry. Denies pain and itching of areas.   PERTINENT  PMH / PSH:  Acrochordons, SK, AK  OBJECTIVE:   BP 122/68   Pulse 87   Wt 93.9 kg   SpO2 98%   BMI 37.86 kg/m    Physical exam  General: well appearing, NAD Cardiovascular: RRR, no murmurs Lungs: CTAB. Normal WOB Abdomen: soft, non-distended, non-tender Skin: warm, dry. Several flesh colored acrochordons on back, on left side and mid back. She also has a flaky skin lesion with concern for an AK mid back on R side under bra area    ASSESSMENT/PLAN:   No problem-specific Assessment & Plan notes found for this encounter.  Skin lesions Areas where AKs were removed are healing well and are non erythematous and without edema. Patient has several acrochordons and an AK on mid back. Advised patient to schedule an appointment with derm clinic if she would like them removed.  Health Maintenance  - flu vaccine - COVID booster    [place]

## 2020-10-27 NOTE — Progress Notes (Signed)
° °  Covid-19 Vaccination Clinic  Name:  Kristen [name]    MRN: [id number] DOB: [birth year]  10/27/2020  [name] was observed post Covid-19 immunization for 15 minutes without incident. She was provided with Vaccine Information Sheet and instruction to access the V-Safe system.   [name] was instructed to call 911 with any severe reactions post vaccine:  Difficulty breathing   Swelling of face and throat   A fast heartbeat   A bad rash all over body   Dizziness and weakness   Booster administered RD without complication.

## 2020-11-02 NOTE — H&P (Signed)
NAME: Kristen [name], Kristen [name] MEDICAL RECORD JI:[id number] ACCOUNT [id number] DATE OF BIRTH:1941/07/04 FACILITY: [facility] LOCATION: [facility] PHYSICIAN:Algernon Mundie Arlean Hopping, MD  HISTORY AND PHYSICAL  DATE OF ADMISSION:  11/05/2020  HISTORY OF PRESENT ILLNESS:  This is an 79 year old white female who was admitted to the hospital as an outpatient for attempted conization and possible removal of what is presumed to be an endometrial polyp.  This patient has had abnormal Pap smears  since 2017, abnormal in the sense that they showed ASCUS or low-grade SIL with positive HPV.  Biopsies have been done that showed no dysplasia until a recent Pap smear showed ASCUS-H and biopsy showed high-grade dysplasia.  The patient was advised a D  and C, conization of the cervix.  She also has a thickened endometrium, but no vaginal bleeding.  This was found on ultrasound done in 2017 to evaluate her for ovarian cancer at her request.  Her mother had ovarian cancer and she wanted to be sure she  did not have it.  She was also having some abdominal bloating at the time.  The ultrasound was negative for ovarian abnormalities,but showed thickened endometrium, 9+ mm and findings suggestive of an endometrial polyp.  The patient was offered D  and C.  She declined.  A repeat ultrasound in 2019 showed the same findings, no significant change and since she was having no bleeding, we elected to just watch the presumed polyp.  Ultimately, she did have an ASCUS Pap in 04/2019, that was HPV positive  and so cervical biopsies were attempted, but unsuccessful because of very tight vagina and difficult to visualize the cervix.  She was treated with estrogen and then in 08/2020, she did have a successful colposcopy and biopsies.  The biopsy showed  high-grade squamous intraepithelial lesion.  The endocervical curettage showed HPV effect with benign endocervical fragments.  Staining showed strong diffuse positivity for p16 through portions of the  mucosa,  morphologically consistent with the  diagnosis.  Dr. Martinique and Dr. Saralyn Pilar reviewed the films.  I asked Dr. Martinique to rereview, he agreed and rereviewed.  There was high-grade dysplasia, so the patient was prepared for D and C, conization and at the same time if I can enter the endometrial  cavity, I will see if there is any endometrial pathology and try to remove it if it is present.  She has been on vaginal estrogen to try to make the procedure more satisfactory because in the past we have had difficulty visualizing the cervix.She has not taken eliquis for 2-3 days  ALLERGIES:  REVEALS ALLERGY TO FLECAINIDE.  MEDICATIONS:  Include alendronate 70 mg once a week, allopurinol 100 mg a day, colchicine 0.6 mg, diazepam 5 mg one-half tablet at bedtime, Eliquis 5 mg twice a day, furosemide 20 mg once a day, lisinopril 20 mg once a day,  metoprolol 50 mg a day, potassium chloride 20 mEq every day.  She is no longer taking the Pradaxa.Restasis furosemide 20 mg qd  PAST MEDICAL HISTORY:  Other past medical history reveals she has never been a smoker.  She occasionally drinks alcohol.  She is widowed.  She occasionally has sexual activity.  She had surgery on her right toe.  She has had a bunionectomy from her right  foot.  Had knee surgery on her left, cyst removed from her right breast, tubal sterilization in 1979.  Colonoscopy approximately 3 years ago, had 1 polyp removed.  Bone density test was in 2017.  She had two full term  deliveries.  She does have atrial  fibrillation and that is the reason for her Eliquis.  She has had diverticulitis.  She has scoliosis.  She has had frequent UTIs in the past.  PHYSICAL EXAMINATION: GENERAL:  Well-developed, obese white female in no distress. HEAD, EYES, NOSE AND THROAT:  No cranial abnormalities.  Extraocular movements intact.  Nose and pharynx clear.She has upper and lower dentures. NECK:  Supple, without thyromegaly. HEART:  Normal size and sounds, no  murmurs. LUNGS:  Clear to auscultation. BREASTS:  Without masses. ABDOMEN:  Soft, nontender. EXTERNAL GENITALIA:  Vulva and vagina clean.  The cervix is difficult to visualize.  It sits in the left lateral fornix.  Uterus is not easily felt.  Adnexa free of masses. RECTAL:  Exam done fairly recently was negative.  ADMITTING IMPRESSION:  High-grade cervical dysplasia, endometrial thickening, atrial fibrillation and obesity.  The patient is admitted for D and C, conization if the cervix is amenable to visualization.  If I can sound the uterus, I intend to place a  hysteroscope and if there is any significant tissue present, to do a D and C.  The patient has been counseled that the risks of surgery are always present, but in her case, the risks are increased because of atrial fibrillation, obesity,there might be cervical stenosis,  the fact that the cervix is very difficult to visualize and because of her advanced age. She understands the risks.  She has been approved for surgery by her cardiologist and the patient is prepared  for the proposed surgery.  HN/NUANCE  D:10/31/2020 T:10/31/2020 JOB:[id number]

## 2020-11-03 ENCOUNTER — Other Ambulatory Visit: Payer: Self-pay

## 2020-11-03 ENCOUNTER — Other Ambulatory Visit (HOSPITAL_COMMUNITY)
Admission: RE | Admit: 2020-11-03 | Discharge: 2020-11-03 | Disposition: A | Discharge status: Home / Self Care | Payer: Medicare Other | Source: Ambulatory Visit | Attending: Obstetrics and Gynecology | Admitting: Obstetrics and Gynecology

## 2020-11-03 ENCOUNTER — Telehealth: Payer: Self-pay | Admitting: Cardiology

## 2020-11-03 ENCOUNTER — Encounter (HOSPITAL_BASED_OUTPATIENT_CLINIC_OR_DEPARTMENT_OTHER): Payer: Self-pay | Admitting: Obstetrics and Gynecology

## 2020-11-03 DIAGNOSIS — Z20822 Contact with and (suspected) exposure to covid-19: Secondary | ICD-10-CM | POA: Insufficient documentation

## 2020-11-03 DIAGNOSIS — Z01812 Encounter for preprocedural laboratory examination: Secondary | ICD-10-CM | POA: Insufficient documentation

## 2020-11-03 NOTE — Telephone Encounter (Signed)
   Pt c/o Shortness Of Breath: STAT if SOB developed within the last 24 hours or pt is noticeably SOB on the phone  1. Are you currently SOB (can you hear that pt is SOB on the phone)? no  2. How long have you been experiencing SOB? A couple months  3. Are you SOB when sitting or when up moving around? When she is tired and gets ready for bed  4. Are you currently experiencing any other symptoms? no  Patient wanted to make Dr. Marlou Porch aware that the patient has had some changes in her breathing. She is also having surgery Thursday 12/30 to remove her cervix.

## 2020-11-03 NOTE — Progress Notes (Addendum)
Spoke w/ via phone for pre-op interview---pt Lab needs dos---- none              Lab results------has lab appointment 11-04-2020 at 1300 pm for cbc with dif and cmet COVID test ------11-03-2020 1500 pm* Arrive at -------730 am NPO after MN NO Solid Food.  Clear liquids from MN until---630 am then npo Medications to take morning of surgery -----loratadine, metorpolol succinate, systane complete eye drop, restasis, pantaprazole, allopurinol Diabetic medication -----n/a Patient Special Instructions -----none Pre-Op special Istructions -----none Patient verbalized understanding of instructions that were given at this phone interview. Patient denies shortness of breath, chest pain, fever, cough at this phone interview.  Anesthesia Review: patient reports occ dyspnea on exertion and at bedtime when lying down when very tired, afib, hx of chf 2010 anaphylaxis with flecanide, htn,  collapsed lung with pneumonia after 08-22-2019 cholecystectomy, has cardiac clearance , ckd stage 4 lov dr Moshe Cipro 09-28-2020 on chart  Chart to jessica zaneto pa for review,  Spoke with patient by phone and patient stated she called dr Marlou Porch office and left message about sob when lies down at night occasionally and sob with heavy exertyion, [name] zanetto pa aware pt called dr Marlou Porch and made aware.  Addendum: Spoke with patient by phone pt to call cardiology office to make aware sob at night when lying down when very tired and when does heavy exertion and to claify lisinopril dosage, pt called back and clairifed lisinopril dosage is  10 mg (she takes 1/2 of 20 mg tablet)  PCP:dr victoria paige Cardiologist :dr Rolla Plate 08-25-2020, has cardiac clearance note 09-11-2020 bhavinkumar bhagat pa epic Chest x-ray :09-15-2019 epic EKG :08-26-2020 epic Echo :02-28-2020 epic Stress test:none Cardiac Cath :none  Activity level: does own house work can climb steps without problems Sleep Study/ CPAP :none Fasting Blood  Sugar :      / Checks Blood Sugar -- times a day:  n/a Blood Thinner/ Instructions /Last Dose: has instructions to stop eliquis 1-2 days before surgery last dose 11-02-2020 per patient ASA / Instructions/ Last Dose : n/a

## 2020-11-03 NOTE — Telephone Encounter (Signed)
Pt calling today to make Dr Marlou Porch aware she is scheduled for surgery with Dr Daiva Huge on 11/05/2020.  She is calling to make him aware because she has been having some shortness of breath, mainly at night when she is tired.  She denies having any SOB presently as she as sitting in her car but will get short winded once she starts moving multiple grocery bags into the house.  She denies any foot/ankle edema though states she is seen at [facility].  She does not wt daily but doesn't feel as though her wt is up right now.  She has some non-productive cough that is ongoing that she reports is r/t allergies and the fact that there is a trucking company and a concrete company "in her back yard".  She recently saw her PCP her told her her lungs sound clear and her 02 sat is normal.  She saw her kidney Dr recently who decreased her Lisinopril to 10 mg daily because her BP was a little low.  She also reports she has not been exercising recently and feels some of her shortness of breath is probably from "being deconditioned."   She states she is mainly calling just to make Dr Marlou Porch aware she is having surgery soon and that she is nervous about it.  Advised I will forward this information to Dr Marlou Porch for his knowledge.  Reassurance given that she will have a great, well prepared team of physicians taking care of her during her surgery.

## 2020-11-04 ENCOUNTER — Encounter (HOSPITAL_COMMUNITY)
Admission: RE | Admit: 2020-11-04 | Discharge: 2020-11-04 | Disposition: A | Discharge status: Home / Self Care | Payer: Medicare Other | Source: Ambulatory Visit | Attending: Obstetrics and Gynecology | Admitting: Obstetrics and Gynecology

## 2020-11-04 ENCOUNTER — Other Ambulatory Visit (HOSPITAL_COMMUNITY): Payer: Medicare Other

## 2020-11-04 DIAGNOSIS — Z01812 Encounter for preprocedural laboratory examination: Secondary | ICD-10-CM | POA: Insufficient documentation

## 2020-11-04 LAB — CBC WITH DIFFERENTIAL/PLATELET
Abs Immature Granulocytes: 0.03 10*3/uL (ref 0.00–0.07)
Basophils Absolute: 0.1 10*3/uL (ref 0.0–0.1)
Basophils Relative: 1 %
Eosinophils Absolute: 0.5 10*3/uL (ref 0.0–0.5)
Eosinophils Relative: 6 %
HCT: 40.4 % (ref 36.0–46.0)
Hemoglobin: 12.8 g/dL (ref 12.0–15.0)
Immature Granulocytes: 0 %
Lymphocytes Relative: 25 %
Lymphs Abs: 2.2 10*3/uL (ref 0.7–4.0)
MCH: 30.7 pg (ref 26.0–34.0)
MCHC: 31.7 g/dL (ref 30.0–36.0)
MCV: 96.9 fL (ref 80.0–100.0)
Monocytes Absolute: 0.7 10*3/uL (ref 0.1–1.0)
Monocytes Relative: 7 %
Neutro Abs: 5.4 10*3/uL (ref 1.7–7.7)
Neutrophils Relative %: 61 %
Platelets: 240 10*3/uL (ref 150–400)
RBC: 4.17 MIL/uL (ref 3.87–5.11)
RDW: 13.7 % (ref 11.5–15.5)
WBC: 8.9 10*3/uL (ref 4.0–10.5)
nRBC: 0 % (ref 0.0–0.2)

## 2020-11-04 LAB — COMPREHENSIVE METABOLIC PANEL
ALT: 17 U/L (ref 0–44)
AST: 26 U/L (ref 15–41)
Albumin: 3.9 g/dL (ref 3.5–5.0)
Alkaline Phosphatase: 78 U/L (ref 38–126)
Anion gap: 10 (ref 5–15)
BUN: 18 mg/dL (ref 8–23)
CO2: 28 mmol/L (ref 22–32)
Calcium: 9.3 mg/dL (ref 8.9–10.3)
Chloride: 103 mmol/L (ref 98–111)
Creatinine, Ser: 1.19 mg/dL — ABNORMAL HIGH (ref 0.44–1.00)
GFR, Estimated: 46 mL/min — ABNORMAL LOW (ref >60)
Glucose, Bld: 101 mg/dL — ABNORMAL HIGH (ref 70–99)
Potassium: 3.9 mmol/L (ref 3.5–5.1)
Sodium: 141 mmol/L (ref 135–145)
Total Bilirubin: 0.8 mg/dL (ref 0.3–1.2)
Total Protein: 7.7 g/dL (ref 6.5–8.1)

## 2020-11-04 LAB — SARS CORONAVIRUS 2 (TAT 6-24 HRS): SARS Coronavirus 2: NEGATIVE

## 2020-11-04 NOTE — Telephone Encounter (Signed)
Thank you for the update.  Agree that he may proceed with surgery.  Recent ejection fraction normal on echocardiogram.  Agree with medication changes by her nephrologist. [name], MD

## 2020-11-04 NOTE — Progress Notes (Signed)
ADDENDUM to previous note by Zelphia Cairo RN on 11-03-2020:  Chart reviewed by anesthesia, Konrad Felix PA, stated pt has clearance to proceed with surgery by dr Marlou Porch , refer to progress note by him today 11-04-2020 @ 1155.

## 2020-11-05 ENCOUNTER — Encounter (HOSPITAL_BASED_OUTPATIENT_CLINIC_OR_DEPARTMENT_OTHER): Payer: Self-pay | Admitting: Obstetrics and Gynecology

## 2020-11-05 ENCOUNTER — Ambulatory Visit (HOSPITAL_BASED_OUTPATIENT_CLINIC_OR_DEPARTMENT_OTHER): Payer: Medicare Other | Admitting: Physician Assistant

## 2020-11-05 ENCOUNTER — Encounter (HOSPITAL_BASED_OUTPATIENT_CLINIC_OR_DEPARTMENT_OTHER)
Admission: RE | Disposition: A | Discharge status: Home / Self Care | Payer: Self-pay | Source: Home / Self Care | Attending: Obstetrics and Gynecology

## 2020-11-05 ENCOUNTER — Ambulatory Visit (HOSPITAL_BASED_OUTPATIENT_CLINIC_OR_DEPARTMENT_OTHER)
Admission: RE | Admit: 2020-11-05 | Discharge: 2020-11-05 | Disposition: A | Discharge status: Home / Self Care | Payer: Medicare Other | Attending: Obstetrics and Gynecology | Admitting: Obstetrics and Gynecology

## 2020-11-05 ENCOUNTER — Other Ambulatory Visit: Payer: Self-pay

## 2020-11-05 DIAGNOSIS — I5033 Acute on chronic diastolic (congestive) heart failure: Secondary | ICD-10-CM | POA: Diagnosis not present

## 2020-11-05 DIAGNOSIS — Z8041 Family history of malignant neoplasm of ovary: Secondary | ICD-10-CM | POA: Diagnosis not present

## 2020-11-05 DIAGNOSIS — R87613 High grade squamous intraepithelial lesion on cytologic smear of cervix (HGSIL): Secondary | ICD-10-CM | POA: Diagnosis present

## 2020-11-05 DIAGNOSIS — N840 Polyp of corpus uteri: Secondary | ICD-10-CM | POA: Diagnosis not present

## 2020-11-05 DIAGNOSIS — N870 Mild cervical dysplasia: Secondary | ICD-10-CM | POA: Insufficient documentation

## 2020-11-05 DIAGNOSIS — Z79899 Other long term (current) drug therapy: Secondary | ICD-10-CM | POA: Diagnosis not present

## 2020-11-05 DIAGNOSIS — N189 Chronic kidney disease, unspecified: Secondary | ICD-10-CM | POA: Diagnosis not present

## 2020-11-05 DIAGNOSIS — N179 Acute kidney failure, unspecified: Secondary | ICD-10-CM | POA: Diagnosis not present

## 2020-11-05 DIAGNOSIS — Z888 Allergy status to other drugs, medicaments and biological substances status: Secondary | ICD-10-CM | POA: Diagnosis not present

## 2020-11-05 DIAGNOSIS — Z7901 Long term (current) use of anticoagulants: Secondary | ICD-10-CM | POA: Diagnosis not present

## 2020-11-05 DIAGNOSIS — Z87440 Personal history of urinary (tract) infections: Secondary | ICD-10-CM | POA: Diagnosis not present

## 2020-11-05 DIAGNOSIS — I130 Hypertensive heart and chronic kidney disease with heart failure and stage 1 through stage 4 chronic kidney disease, or unspecified chronic kidney disease: Secondary | ICD-10-CM | POA: Diagnosis not present

## 2020-11-05 HISTORY — PX: HYSTEROSCOPY WITH D & C: SHX1775

## 2020-11-05 HISTORY — DX: Chronic kidney disease, unspecified: N18.9

## 2020-11-05 HISTORY — DX: Cardiac arrhythmia, unspecified: I49.9

## 2020-11-05 HISTORY — DX: Dyspnea, unspecified: R06.00

## 2020-11-05 HISTORY — PX: CERVICAL CONIZATION W/BX: SHX1330

## 2020-11-05 LAB — TYPE AND SCREEN
ABO/RH(D): O POS
Antibody Screen: NEGATIVE

## 2020-11-05 LAB — ABO/RH: ABO/RH(D): O POS

## 2020-11-05 SURGERY — CONE BIOPSY, CERVIX
Anesthesia: General | Site: Vagina

## 2020-11-05 MED ORDER — ACETAMINOPHEN 325 MG PO TABS: 325.0000 mg | ORAL_TABLET | ORAL | Status: DC | PRN

## 2020-11-05 MED ORDER — MEPERIDINE HCL 25 MG/ML IJ SOLN: 6.2500 mg | INTRAMUSCULAR | Status: DC | PRN

## 2020-11-05 MED ORDER — PROPOFOL 10 MG/ML IV BOLUS
INTRAVENOUS | Status: AC
  Filled 2020-11-05: qty 20

## 2020-11-05 MED ORDER — OXYCODONE HCL 5 MG PO TABS: 5.0000 mg | ORAL_TABLET | Freq: Once | ORAL | Status: DC | PRN

## 2020-11-05 MED ORDER — FENTANYL CITRATE (PF) 100 MCG/2ML IJ SOLN
INTRAMUSCULAR | Status: AC
  Filled 2020-11-05: qty 2

## 2020-11-05 MED ORDER — KETOROLAC TROMETHAMINE 30 MG/ML IJ SOLN
INTRAMUSCULAR | Status: AC
  Filled 2020-11-05: qty 1

## 2020-11-05 MED ORDER — LIDOCAINE HCL (PF) 2 % IJ SOLN
INTRAMUSCULAR | Status: AC
  Filled 2020-11-05: qty 5

## 2020-11-05 MED ORDER — ONDANSETRON HCL 4 MG/2ML IJ SOLN
INTRAMUSCULAR | Status: DC | PRN
Start: 2020-11-05 — End: 2020-11-05
  Administered 2020-11-05: 4 mg via INTRAVENOUS

## 2020-11-05 MED ORDER — FENTANYL CITRATE (PF) 100 MCG/2ML IJ SOLN: 25.0000 ug | INTRAMUSCULAR | Status: DC | PRN

## 2020-11-05 MED ORDER — PHENYLEPHRINE 40 MCG/ML (10ML) SYRINGE FOR IV PUSH (FOR BLOOD PRESSURE SUPPORT)
PREFILLED_SYRINGE | INTRAVENOUS | Status: AC
  Filled 2020-11-05: qty 10

## 2020-11-05 MED ORDER — SODIUM CHLORIDE 0.9 % IV SOLN: INTRAVENOUS | Status: DC

## 2020-11-05 MED ORDER — POVIDONE-IODINE 10 % EX SWAB: 2.0000 "application " | Freq: Once | CUTANEOUS | Status: DC

## 2020-11-05 MED ORDER — ONDANSETRON HCL 4 MG/2ML IJ SOLN
INTRAMUSCULAR | Status: AC
  Filled 2020-11-05: qty 2

## 2020-11-05 MED ORDER — LIDOCAINE 2% (20 MG/ML) 5 ML SYRINGE
INTRAMUSCULAR | Status: DC | PRN
  Administered 2020-11-05: 60 mg via INTRAVENOUS

## 2020-11-05 MED ORDER — ACETAMINOPHEN 160 MG/5ML PO SOLN: 325.0000 mg | ORAL | Status: DC | PRN

## 2020-11-05 MED ORDER — 0.9 % SODIUM CHLORIDE (POUR BTL) OPTIME
TOPICAL | Status: DC | PRN
  Administered 2020-11-05: 10:00:00 500 mL

## 2020-11-05 MED ORDER — KETOROLAC TROMETHAMINE 30 MG/ML IJ SOLN
INTRAMUSCULAR | Status: DC | PRN
  Administered 2020-11-05: 15 mg via INTRAVENOUS

## 2020-11-05 MED ORDER — DEXAMETHASONE SODIUM PHOSPHATE 10 MG/ML IJ SOLN
INTRAMUSCULAR | Status: DC | PRN
  Administered 2020-11-05: 10 mg via INTRAVENOUS

## 2020-11-05 MED ORDER — FERRIC SUBSULFATE SOLN
Status: DC | PRN
  Administered 2020-11-05: 1

## 2020-11-05 MED ORDER — ONDANSETRON HCL 4 MG/2ML IJ SOLN: 4.0000 mg | Freq: Once | INTRAMUSCULAR | Status: DC | PRN

## 2020-11-05 MED ORDER — PHENYLEPHRINE 40 MCG/ML (10ML) SYRINGE FOR IV PUSH (FOR BLOOD PRESSURE SUPPORT)
PREFILLED_SYRINGE | INTRAVENOUS | Status: DC | PRN
  Administered 2020-11-05 (×5): 80 ug via INTRAVENOUS

## 2020-11-05 MED ORDER — DEXAMETHASONE SODIUM PHOSPHATE 10 MG/ML IJ SOLN
INTRAMUSCULAR | Status: AC
  Filled 2020-11-05: qty 1

## 2020-11-05 MED ORDER — PROPOFOL 10 MG/ML IV BOLUS
INTRAVENOUS | Status: DC | PRN
  Administered 2020-11-05: 130 mg via INTRAVENOUS

## 2020-11-05 MED ORDER — SODIUM CHLORIDE 0.9 % IR SOLN
Status: DC | PRN
  Administered 2020-11-05: 1000 mL

## 2020-11-05 MED ORDER — OXYCODONE HCL 5 MG/5ML PO SOLN: 5.0000 mg | Freq: Once | ORAL | Status: DC | PRN

## 2020-11-05 MED ORDER — FENTANYL CITRATE (PF) 100 MCG/2ML IJ SOLN
INTRAMUSCULAR | Status: DC | PRN
  Administered 2020-11-05 (×3): 50 ug via INTRAVENOUS

## 2020-11-05 SURGICAL SUPPLY — 39 items
APL SWBSTK 6 STRL LF DISP (MISCELLANEOUS) ×2
APPLICATOR COTTON TIP 6 STRL (MISCELLANEOUS) ×2 IMPLANT
APPLICATOR COTTON TIP 6IN STRL (MISCELLANEOUS) ×4
BLADE SURG 15 STRL LF DISP TIS (BLADE) ×1 IMPLANT
BLADE SURG 15 STRL SS (BLADE) ×2
CANISTER SUCT 1200ML W/VALVE (MISCELLANEOUS) IMPLANT
CANISTER SUCT 3000ML PPV (MISCELLANEOUS) IMPLANT
CATH ROBINSON RED A/P 16FR (CATHETERS) ×2 IMPLANT
COVER WAND RF STERILE (DRAPES) ×2 IMPLANT
DEVICE MYOSURE REACH (MISCELLANEOUS) ×2 IMPLANT
ELECT BALL LEEP 3MM BLK (ELECTRODE) IMPLANT
ELECT BALL LEEP 5MM RED (ELECTRODE) IMPLANT
ELECT NEEDLE TIP 2.8 STRL (NEEDLE) IMPLANT
ELECT REM PT RETURN 9FT ADLT (ELECTROSURGICAL)
ELECTRODE REM PT RTRN 9FT ADLT (ELECTROSURGICAL) IMPLANT
GLOVE BIO SURGEON STRL SZ7.5 (GLOVE) ×2 IMPLANT
GLOVE BIOGEL PI IND STRL 7.0 (GLOVE) ×1 IMPLANT
GLOVE BIOGEL PI INDICATOR 7.0 (GLOVE) ×1
GLOVE ECLIPSE 7.5 STRL STRAW (GLOVE) ×2 IMPLANT
GLOVE ECLIPSE 8.0 STRL XLNG CF (GLOVE) ×2 IMPLANT
GOWN STRL REUS W/ TWL LRG LVL3 (GOWN DISPOSABLE) ×2 IMPLANT
GOWN STRL REUS W/TWL LRG LVL3 (GOWN DISPOSABLE) ×4
IV NS IRRIG 3000ML ARTHROMATIC (IV SOLUTION) ×2 IMPLANT
KIT PROCEDURE FLUENT (KITS) ×2 IMPLANT
KIT TURNOVER CYSTO (KITS) ×2 IMPLANT
NDL SAFETY ECLIPSE 18X1.5 (NEEDLE) ×1 IMPLANT
NEEDLE HYPO 18GX1.5 SHARP (NEEDLE) ×2
NS IRRIG 500ML POUR BTL (IV SOLUTION) ×2 IMPLANT
PACK VAGINAL MINOR WOMEN LF (CUSTOM PROCEDURE TRAY) ×2 IMPLANT
PACK VAGINAL WOMENS (CUSTOM PROCEDURE TRAY) ×2 IMPLANT
PAD OB MATERNITY 4.3X12.25 (PERSONAL CARE ITEMS) ×2 IMPLANT
PAD PREP 24X48 CUFFED NSTRL (MISCELLANEOUS) ×2 IMPLANT
PENCIL SMOKE EVACUATOR (MISCELLANEOUS) IMPLANT
SCOPETTES 8  STERILE (MISCELLANEOUS) ×4
SCOPETTES 8 STERILE (MISCELLANEOUS) ×2 IMPLANT
SUT CHROMIC 0 CT 1 (SUTURE) ×8 IMPLANT
SYR CONTROL 10ML LL (SYRINGE) ×2 IMPLANT
TOWEL OR 17X26 10 PK STRL BLUE (TOWEL DISPOSABLE) ×2 IMPLANT
WATER STERILE IRR 500ML POUR (IV SOLUTION) IMPLANT

## 2020-11-05 NOTE — Anesthesia Preprocedure Evaluation (Addendum)
Anesthesia Evaluation  Patient identified by MRN, date of birth, ID band Patient awake    Reviewed: Allergy & Precautions, NPO status , Patient's Chart, lab work & pertinent test results, reviewed documented beta blocker date and time   History of Anesthesia Complications Negative for: history of anesthetic complications  Airway Mallampati: I  TM Distance: >3 FB Neck ROM: Full    Dental  (+) Edentulous Upper, Edentulous Lower   Pulmonary neg pulmonary ROS,    Pulmonary exam normal        Cardiovascular hypertension, Pt. on home beta blockers and Pt. on medications + dysrhythmias Atrial Fibrillation  Rhythm:Irregular Rate:Normal   '20 TTE - EF 60 to 65%. Mildly increased left ventricular hypertrophy. Trace MR and TR. Moderately elevated pulmonary artery systolic pressure.    Neuro/Psych PSYCHIATRIC DISORDERS Anxiety Depression negative neurological ROS     GI/Hepatic Neg liver ROS, hiatal hernia, GERD  Medicated and Controlled,  Endo/Other  Morbid obesity  Renal/GU CRFRenal disease  Female GU complaint     Musculoskeletal  (+) Arthritis , Osteoarthritis,   Gout Scoliosis    Abdominal   Peds  Hematology negative hematology ROS (+)   Anesthesia Other Findings    Reproductive/Obstetrics                           Anesthesia Physical  Anesthesia Plan  ASA: III  Anesthesia Plan: General   Post-op Pain Management:    Induction: Intravenous  PONV Risk Score and Plan: 2 and Treatment may vary due to age or medical condition, Ondansetron and Dexamethasone  Airway Management Planned: Oral ETT and LMA  Additional Equipment: None  Intra-op Plan:   Post-operative Plan: Extubation in OR  Informed Consent: I have reviewed the patients History and Physical, chart, labs and discussed the procedure including the risks, benefits and alternatives for the proposed anesthesia with the patient  or authorized representative who has indicated his/her understanding and acceptance.     Dental advisory given  Plan Discussed with: CRNA and Anesthesiologist  Anesthesia Plan Comments: ( )        Anesthesia Quick Evaluation

## 2020-11-05 NOTE — Transfer of Care (Signed)
Immediate Anesthesia Transfer of Care Note  Patient: Kristen [name]  Procedure(s) Performed: CONIZATION CERVIX WITH BIOPSY (N/A Vagina ) DILATATION AND CURETTAGE /HYSTEROSCOPY WITH MYOSURE (N/A Vagina )  Patient Location: PACU  Anesthesia Type:General  Level of Consciousness: awake, alert , oriented, drowsy and patient cooperative  Airway & Oxygen Therapy: Patient Spontanous Breathing  Post-op Assessment: Report given to RN and Post -op Vital signs reviewed and stable  Post vital signs: Reviewed and stable  Last Vitals:  Vitals Value Taken Time  BP 132/88 11/05/20 1036  Temp 36.4 C 11/05/20 1036  Pulse 81 11/05/20 1038  Resp 20 11/05/20 1038  SpO2 93 % 11/05/20 1038  Vitals shown include unvalidated device data.  Last Pain:  Vitals:   11/05/20 0826  TempSrc: Oral  PainSc: 0-No pain      Patients Stated Pain Goal: 5 (76/54/65 0354)  Complications: No complications documented.

## 2020-11-05 NOTE — Brief Op Note (Signed)
Op note:  Pre op dx: High grade SIL of the cervix Probable endometrial polyp Post op dx: Same  Op: CONIZATION OF THE CERVIX ECC Hysteroscopy, Myosure resection of probable endometrial polyp Op: Delta Deshmukh General anesthesia. EBL 50 cc's. Pt will be transferred to the RR

## 2020-11-05 NOTE — Anesthesia Procedure Notes (Signed)
Procedure Name: LMA Insertion Date/Time: 11/05/2020 9:25 AM Performed by: Rogers Blocker, CRNA Pre-anesthesia Checklist: Patient identified, Emergency Drugs available, Suction available and Patient being monitored Patient Re-evaluated:Patient Re-evaluated prior to induction Oxygen Delivery Method: Circle System Utilized Preoxygenation: Pre-oxygenation with 100% oxygen Induction Type: IV induction Ventilation: Mask ventilation without difficulty LMA: LMA inserted LMA Size: 4.0 Number of attempts: 1 Placement Confirmation: positive ETCO2 Tube secured with: Tape Dental Injury: Teeth and Oropharynx as per pre-operative assessment

## 2020-11-05 NOTE — Discharge Instructions (Signed)
No vaginal entrance until permission given by MD Follow discharge instructions. Resume eliquis on 11-06-20   Cervical Conization, Care After This sheet gives you information about how to care for yourself after your procedure. Your doctor may also give you more specific instructions. If you have problems or questions, contact your doctor. What can I expect after the procedure? After the procedure, it is common to have:  A groggy feeling, if you were given medicine to make you fall asleep (general anesthetic).  Cramps that feel like menstrual cramps.  Bloody discharge or light to moderate bleeding.  Dark fluid from the vagina (vaginal discharge). This fluid may look similar to coffee grounds. Follow these instructions at home: Medicines  Take over-the-counter and prescription medicines only as told by your doctor.  Do not take aspirin until your doctor says it is okay. Activity   Rest as told by your doctor.  For 7-14 days after your procedure, avoid: ? Being very active. ? Exercising. ? Heavy lifting.  Do not lift anything that is heavier than 10 lb (4.5 kg), or the limit that you are told, until your doctor says that it is safe.  Return to your normal activities as told by your doctor. Ask your doctor what activities are safe for you. General instructions   You may eat your normal diet unless your doctor tells you not to do so.  Drink enough fluid to keep your pee (urine) pale yellow.  Do not take baths, swim, or use a hot tub until your doctor approves. Ask your doctor if you may take showers. You may only be allowed to take sponge baths.  Do not douche, have sex, or put anything in the vagina, including tampons, until your doctor says it is okay.  Keep all follow-up visits as told by your doctor. This is important. Contact a doctor if:  You get a rash.  You are dizzy or light-headed.  You feel like you may vomit (nauseous).  You vomit.  You have fluid from  your vagina that smells bad. Get help right away if:  There are bloody clumps (clots) coming from your vagina.  You have more bleeding than you would have in a normal menstrual period. For example, you soak a pad in less than 1 hour.  You have a fever.  You have more and more cramps.  You pass out (faint).  You have pain when peeing.  You have very bad pain, or your pain gets worse. Medicine does not help your pain.  You have blood in your pee. Summary  After the procedure, it is common to have cramps, some bleeding, and dark or bloody fluid from your vagina.  Do not douche, have sex, or use tampons until your doctor says it is okay.  For about 7-14 days after your procedure, try not to exercise or lift heavy objects. This information is not intended to replace advice given to you by your health care provider. Make sure you discuss any questions you have with your health care provider. Document Revised: 04/22/2019 Document Reviewed: 04/22/2019 Elsevier Patient Education  [place] Instructions  Activity: Get plenty of rest for the remainder of the day. A responsible adult should stay with you for 24 hours following the procedure.  For the next 24 hours, DO NOT: -Drive a car -Paediatric nurse -Drink alcoholic beverages -Take any medication unless instructed by your physician -Make any legal decisions or sign important papers.  Meals:  Start with liquid foods such as gelatin or soup. Progress to regular foods as tolerated. Avoid greasy, spicy, heavy foods. If nausea and/or vomiting occur, drink only clear liquids until the nausea and/or vomiting subsides. Call your physician if vomiting continues.  Special Instructions/Symptoms: Your throat may feel dry or sore from the anesthesia or the breathing tube placed in your throat during surgery. If this causes discomfort, gargle with warm salt water. The discomfort should disappear within 24  hours.  If you had a scopolamine patch placed behind your ear for the management of post- operative nausea and/or vomiting:  1. The medication in the patch is effective for 72 hours, after which it should be removed.  Wrap patch in a tissue and discard in the trash. Wash hands thoroughly with soap and water. 2. You may remove the patch earlier than 72 hours if you experience unpleasant side effects which may include dry mouth, dizziness or visual disturbances. 3. Avoid touching the patch. Wash your hands with soap and water after contact with the patch.

## 2020-11-05 NOTE — Progress Notes (Signed)
I examined this lady yesterday and she reports no change in her health since that time.

## 2020-11-05 NOTE — Anesthesia Postprocedure Evaluation (Signed)
Anesthesia Post Note  Patient: Shaterica Mcclatchy Christiano  Procedure(s) Performed: CONIZATION CERVIX WITH BIOPSY (N/A Vagina ) DILATATION AND CURETTAGE /HYSTEROSCOPY WITH MYOSURE (N/A Vagina )     Patient location during evaluation: PACU Anesthesia Type: General Level of consciousness: awake and alert Pain management: pain level controlled Vital Signs Assessment: post-procedure vital signs reviewed and stable Respiratory status: spontaneous breathing, nonlabored ventilation, respiratory function stable and patient connected to nasal cannula oxygen Cardiovascular status: blood pressure returned to baseline and stable Postop Assessment: no apparent nausea or vomiting Anesthetic complications: no   No complications documented.  Last Vitals:  Vitals:   11/05/20 1100 11/05/20 1115  BP: 133/81 138/80  Pulse: 89 68  Resp: 13 14  Temp:    SpO2: 94% 91%    Last Pain:  Vitals:   11/05/20 1100  TempSrc:   PainSc: 0-No pain                 Danna Casella

## 2020-11-09 ENCOUNTER — Encounter (HOSPITAL_BASED_OUTPATIENT_CLINIC_OR_DEPARTMENT_OTHER): Payer: Self-pay | Admitting: Obstetrics and Gynecology

## 2020-11-09 LAB — SURGICAL PATHOLOGY

## 2020-11-09 NOTE — Op Note (Signed)
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  OPERATION:  Conization of the cervix, hysteroscopy, MyoSure resection of presumed endometrial polyp, endocervical curettage.  OPERATOR:  Newton Pigg, MD  ANESTHESIA:  General anesthesia.  DESCRIPTION OF PROCEDURE:  The patient was brought to the operating room, placed under satisfactory general anesthesia, and placed in lithotomy position in [place].  Sequential compression devices were in place.  The patient was in lithotomy  position.  The vulva, vagina, and perineum were prepped with Betadine solution.  A timeout was done identifying the patient, her allergies, presence of sequential compression devices on her legs, and the operation to be done noting that cervical stenosis  might prevent looking into her endometrial cavity.  After the prep was done with Betadine, the bladder was emptied with a [place] catheter.  The prep and the catheterization were done by the RN.  Exam revealed the uterus to be very difficult to feel,  probably small midline.  By palpation, the cervix was flush with the vagina and as noted in the office was pretty much stuck in the left lateral fornix.  After sterile draping, a weighted speculum and Deaver retractors were used to expose the cervix.  It  was difficult to feel.  It did not descend down into the operative field well, and it was for some reason slightly adherent in the left vaginal fornix.  I was able to grasp it with tenaculums, bring it somewhat  into the operative field, and I did a  conization.  There was a little tissue left posteriorly, so I removed this and sent it as a separate specimen.  After the conization was complete, the uterus was sounded to about 2-1/2 to 3 inches, dilated up to a 19 Pratt dilator.  The hysteroscope was  introduced.  We could visualize the endometrial cavity and what appeared to be a polyp coming from the anterior wall of the uterus.  I then inserted the operating hysteroscope with the MyoSure, and I resected what seemed to be an endometrial polyp from  the anterior uterus.  I identified what I thought were the tubal ostia.  There was no other pathology in the uterus.  The hysteroscope was removed, and attention was turned to reapproximating the cervix to gain hemostasis.  With great difficulty, I was  able to expose the cervix enough to suture it with 4 or 5 sutures of 0 chromic, and at the end of this procedure, the bleeding was completely controlled.  I sounded the uterus again and did a small endocervical curettage producing minimal if any tissue,  but what there was, was preserved.  I then placed Monsel solution into the cervical bed and the procedure was terminated.  Blood loss was probably about 50 mL.  Sponge and needle counts were correct.  The patient will be transferred to recovery.  IN/NUANCE  D:11/05/2020 T:11/05/2020 JOB:[id number]

## 2020-11-11 DIAGNOSIS — I8311 Varicose veins of right lower extremity with inflammation: Secondary | ICD-10-CM | POA: Diagnosis not present

## 2020-11-13 DIAGNOSIS — R309 Painful micturition, unspecified: Secondary | ICD-10-CM | POA: Diagnosis not present

## 2020-11-17 DIAGNOSIS — I8312 Varicose veins of left lower extremity with inflammation: Secondary | ICD-10-CM | POA: Diagnosis not present

## 2020-11-23 ENCOUNTER — Telehealth: Payer: Self-pay | Admitting: Cardiology

## 2020-11-23 NOTE — Telephone Encounter (Signed)
Patient had to cancel her appt with DR. Skains 11/25/20 because she had another appt with her OB/GYN at the same time. She was not sure if she needed to be seen sooner or if it was safe for her to wait until 12/22/20. Please advise

## 2020-11-23 NOTE — Telephone Encounter (Signed)
Called patient back about her message. Patient stated she is not having any acute issues at this time with her heart. Informed patient that she should be fine waiting until February to see Dr. Marlou Porch. Patient agreed to plan and will see Dr. Marlou Porch in February.

## 2020-11-25 ENCOUNTER — Ambulatory Visit: Payer: Medicare Other | Admitting: Cardiology

## 2020-12-07 ENCOUNTER — Ambulatory Visit (INDEPENDENT_AMBULATORY_CARE_PROVIDER_SITE_OTHER): Payer: Medicare Other | Admitting: Family Medicine

## 2020-12-07 ENCOUNTER — Encounter: Payer: Self-pay | Admitting: Family Medicine

## 2020-12-07 ENCOUNTER — Other Ambulatory Visit: Payer: Self-pay

## 2020-12-07 DIAGNOSIS — M67431 Ganglion, right wrist: Secondary | ICD-10-CM

## 2020-12-07 NOTE — Progress Notes (Signed)
    SUBJECTIVE:   CHIEF COMPLAINT / HPI: Wrist nodule  Right wrist nodule has been present for one week. She denies any recent trauma to the area and decided to be evaluated because she was worried about cancer. Patient works as an Programmer, multimedia and draws often. Area is slightly sore with wrist flexion but denies any weakness or numbness in hand or fingers.   OBJECTIVE:   BP 135/75   Pulse 76   Ht 5\' 3"  (1.6 m)   Wt 208 lb 12.8 oz (94.7 kg)   SpO2 95%   BMI 36.99 kg/m   Hand: Inspection: 2cm nodule on volar aspect of wrist, soft, no skin changes or surrounding erythema or warmth to touch. No bruising Palpation: mildly TTP directly over swelling  ROM: Full ROM of the digits and wrist Strength: 5/5 strength in the forearm, wrist and interosseus muscles Neurovascular: NV intact Special tests: negative tinel's at the carpal tunnel, negative Phalen's  ASSESSMENT/PLAN:   Ganglion cyst of volar aspect of right wrist Findings on exam consistent with ganglion cyst for one week. Patient denies debilitation nature.  Recommend heating pad to help ease any soreness  Discussed options operative and nonoperative, patient prefers expectant management at this time  Patient counseled on returning to care if area enlarges quickly, becomes red or warm to touch or begins to limit her range of motion, patient voiced understanding Follow-up in 6 weeks if no improvement      Eulis Foster, MD [place]

## 2020-12-07 NOTE — Assessment & Plan Note (Addendum)
Findings on exam consistent with ganglion cyst for one week. Patient denies debilitation nature.  Recommend heating pad to help ease any soreness  Discussed options operative and nonoperative, patient prefers expectant management at this time  Patient counseled on returning to care if area enlarges quickly, becomes red or warm to touch or begins to limit her range of motion, patient voiced understanding Follow-up in 6 weeks if no improvement

## 2020-12-07 NOTE — Patient Instructions (Signed)
Ganglion Cyst  A ganglion cyst is a non-cancerous, fluid-filled lump of tissue that occurs near a joint, tendon, or ligament. The cyst grows out of a joint or the lining of a tendon or ligament. Ganglion cysts most often develop in the hand or wrist, but they can also develop in the shoulder, elbow, hip, knee, ankle, or foot. Ganglion cysts are ball-shaped or egg-shaped. Their size can range from the size of a pea to larger than a grape. Increased activity may cause the cyst to get bigger because more fluid starts to build up. What are the causes? The exact cause of this condition is not known, but it may be related to:  Inflammation or irritation around the joint.  An injury or tear in the layers of tissue around the joint (joint capsule).  Repetitive movements or overuse.  History of acute or repeated injury. What increases the risk? You are more likely to develop this condition if:  You are a female.  You are 12-56 years old. What are the signs or symptoms? The main symptom of this condition is a lump. It most often appears on the hand or wrist. In many cases, there are no other symptoms, but a cyst can sometimes cause:  Tingling.  Pain or tenderness.  Numbness.  Weakness or loss of strength in the affected joint.  Decreased range of motion in the affected area of the body.   How is this diagnosed? Ganglion cysts are usually diagnosed based on a physical exam. Your health care provider will feel the lump and may shine a light next to it. If it is a ganglion cyst, the light will likely shine through it. Your health care provider may order an X-ray, ultrasound, MRI, or CT scan to rule out other conditions. How is this treated? Ganglion cysts often go away on their own without treatment. If you have pain or other symptoms, treatment may be needed. Treatment is also needed if the ganglion cyst limits your movement or if it gets infected. Treatment may include:  Wearing a brace  or splint on your wrist or finger.  Taking anti-inflammatory medicine.  Having fluid drained from the lump with a needle (aspiration).  Getting an injection of medicine into the joint to decrease inflammation. This may be corticosteroids, ethanol, or hyaluronidase.  Having surgery to remove the ganglion cyst.  Placing a pad in your shoe or wearing shoes that will not rub against the cyst if it is on your foot. Follow these instructions at home:  Do not press on the ganglion cyst, poke it with a needle, or hit it.  Take over-the-counter and prescription medicines only as told by your health care provider.  If you have a brace or splint: ? Wear it as told by your health care provider. ? Remove it as told by your health care provider. Ask if you need to remove it when you take a shower or a bath.  Watch your ganglion cyst for any changes.  Keep all follow-up visits as told by your health care provider. This is important. Contact a health care provider if:  Your ganglion cyst becomes larger or more painful.  You have pus coming from the lump.  You have weakness or numbness in the affected area.  You have a fever or chills. Get help right away if:  You have a fever and have any of these in the cyst area: ? Increased redness. ? Red streaks. ? Swelling. Summary  A ganglion cyst is  a non-cancerous, fluid-filled lump that occurs near a joint, tendon, or ligament.  Ganglion cysts most often develop in the hand or wrist, but they can also develop in the shoulder, elbow, hip, knee, ankle, or foot.  Ganglion cysts often go away on their own without treatment. This information is not intended to replace advice given to you by your health care provider. Make sure you discuss any questions you have with your health care provider. Document Revised: 01/15/2020 Document Reviewed: 01/15/2020 Elsevier Patient Education  [place].

## 2020-12-08 ENCOUNTER — Encounter: Payer: Self-pay | Admitting: Family Medicine

## 2020-12-08 DIAGNOSIS — R600 Localized edema: Secondary | ICD-10-CM | POA: Diagnosis not present

## 2020-12-08 DIAGNOSIS — I8311 Varicose veins of right lower extremity with inflammation: Secondary | ICD-10-CM | POA: Diagnosis not present

## 2020-12-15 ENCOUNTER — Other Ambulatory Visit: Payer: Self-pay | Admitting: Family Medicine

## 2020-12-15 DIAGNOSIS — F411 Generalized anxiety disorder: Secondary | ICD-10-CM

## 2020-12-22 ENCOUNTER — Other Ambulatory Visit: Payer: Self-pay

## 2020-12-22 ENCOUNTER — Ambulatory Visit (INDEPENDENT_AMBULATORY_CARE_PROVIDER_SITE_OTHER): Payer: Medicare Other | Admitting: Cardiology

## 2020-12-22 ENCOUNTER — Encounter: Payer: Self-pay | Admitting: Cardiology

## 2020-12-22 VITALS — BP 120/70 | HR 67 | Ht 63.0 in | Wt 209.0 lb

## 2020-12-22 DIAGNOSIS — N1831 Chronic kidney disease, stage 3a: Secondary | ICD-10-CM

## 2020-12-22 DIAGNOSIS — I4821 Permanent atrial fibrillation: Secondary | ICD-10-CM | POA: Diagnosis not present

## 2020-12-22 NOTE — Patient Instructions (Signed)
Medication Instructions:  The current medical regimen is effective;  continue present plan and medications.  *If you need a refill on your cardiac medications before your next appointment, please call your pharmacy*  Follow-Up: At [facility], you and your health needs are our priority.  As part of our continuing mission to provide you with exceptional heart care, we have created designated Provider Care Teams.  These Care Teams include your primary Cardiologist (physician) and Advanced Practice Providers (APPs -  Physician Assistants and Nurse Practitioners) who all work together to provide you with the care you need, when you need it.  We recommend signing up for the patient portal called "MyChart".  Sign up information is provided on this After Visit Summary.  MyChart is used to connect with patients for Virtual Visits (Telemedicine).  Patients are able to view lab/test results, encounter notes, upcoming appointments, etc.  Non-urgent messages can be sent to your provider as well.   To learn more about what you can do with MyChart, go to [email or web address].    Your next appointment:   12 month(s)  The format for your next appointment:   In Person  Provider:   Mark Skains, MD   Thank you for choosing [facility]!!      

## 2020-12-22 NOTE — Progress Notes (Signed)
Cardiology Office Note:    Date:  12/22/2020   ID:  Kristen [name], DOB 05-11-1941, MRN [id number]  PCP:  Program, [facility]  Cardiologist:  [name], MD  Advanced Practice Provider:  No care team member to display Electrophysiologist:  None       Referring MD: Shary Key, DO     History of Present Illness:    Kristen [name] is a 80 y.o. female here for the follow-up of atrial fibrillation, chronic anticoagulation with Eliquis.  Overall doing quite well.  Remember she does have an allergy to flecainide which was severe.  She teaches dancing in the past.  Problems following gallbladder surgery.  Was in the hospital about a month.  Deals with chronic venous insufficiency, [place] vein specialist.  Also sees nephrology for her chronic kidney disease stage III.  Overall she is doing quite well currently without any sensations of palpitations no chest pain no significant shortness of breath.  She has stopped fish oil.  Recent cone procedure by OB/GYN.  Past Medical History:  Diagnosis Date  . Abscess of abdominal cavity ([place]) 10/02/2019  . Anticoagulant long-term use    elquis -- managed by cardiology  . Anxiety    SITUATIONAL IN PAST  . Chronic calculous cholecystitis   . Chronic kidney disease    ckd stage 4 per lov dr Corliss Parish 09-28-2020 on chart  . Chronic venous insufficiency    w/  varicose veins right worse than left waers compresiion stocking prn  . Depression    SITUATIONAL IN PAST  . Diastolic CHF, chronic ([place]) 2010   followed by cardiology HX OF 2010 DUE TO FLECANIDE  . Diverticulosis of colon   . Dyspnea    occ with heavt activity and at bedtime when tired for last few months per pt  . Dysrhythmia    a-fib  . Edema of both lower extremities    per pt mostly in summer time  . Essential hypertension, benign   . Fibrocystic breast disease   . Full dentures   .  GERD (gastroesophageal reflux disease)    occasional tums and does not eat prior to bedtime  . Gout    08-19-2019  per pt last episode DEC 2021 LEFT HAND  . Hiatal hernia   . History of diverticulitis 01/22/2016  . History of recurrent UTIs    NONE RECNET SEES DR WRENN  . Hx of cholecystectomy 10/02/2019  . OA (osteoarthritis)    knees, lower back  . Permanent atrial fibrillation [facility]) cardiologist--- dr Agustin Cree   first dx 10/ 2011--- histroy DCCV 11-25-2010 by dr Wynonia Lawman (pt's previous cardiologist) and Cardiac cath 12-17-2010 no significant disease  . Pneumonia 08/2019   COLLAPSED LEFT LUNG AND PNEUMONIA  . Scoliosis   . Stress incontinence in female     Past Surgical History:  Procedure Laterality Date  . BILIARY STENT PLACEMENT N/A 09/03/2019   Procedure: BILIARY STENT PLACEMENT;  Surgeon: Clarene Essex, MD;  Location: [facility];  Service: Endoscopy;  Laterality: N/A;  . BLEPHAROPLASTY Bilateral 02-22-2010  dr Georgia Lopes   upper eyelid's  . both knees cortisone injection  08/18/2020   dr Noemi Chapel  . BUNIONECTOMY Right 2003  . CATARACT EXTRACTION W/ INTRAOCULAR LENS  IMPLANT, BILATERAL  2018  . CERVICAL CONIZATION W/BX N/A 11/05/2020   Procedure: CONIZATION CERVIX WITH BIOPSY;  Surgeon: Newton Pigg, MD;  Location[facility];  Service: Gynecology;  Laterality: N/A;  . CHOLECYSTECTOMY N/A 08/22/2019   Procedure: LAPAROSCOPIC CHOLECYSTECTOMY;  Surgeon: Kieth Brightly [name], MD;  Location: [facility];  Service: General;  Laterality: N/A;  . ERCP N/A 09/03/2019   Procedure: ENDOSCOPIC RETROGRADE CHOLANGIOPANCREATOGRAPHY (ERCP);  Surgeon: Clarene Essex, MD;  Location: [facility];  Service: Endoscopy;  Laterality: N/A;  . ESOPHAGOGASTRODUODENOSCOPY (EGD) WITH PROPOFOL N/A 12/06/2019   Procedure: ESOPHAGOGASTRODUODENOSCOPY (EGD) WITH PROPOFOL;  Surgeon: Clarene Essex, MD;  Location: [facility];  Service: Endoscopy;  Laterality: N/A;  stent removal,  ERCP Scope, needs Flouro  . HYSTEROSCOPY WITH D & C N/A 11/05/2020   Procedure: DILATATION AND CURETTAGE /HYSTEROSCOPY WITH MYOSURE;  Surgeon: Newton Pigg, MD;  Location: [facility];  Service: Gynecology;  Laterality: N/A;  . IR SINUS/FIST TUBE CHK-NON GI  09/10/2019  . IR SINUS/FIST TUBE CHK-NON GI  09/24/2019  . KNEE ARTHROSCOPY Left 2002   meniscus repair  . REMOVAL OF STONES  09/03/2019   Procedure: REMOVAL OF STONES;  Surgeon: Clarene Essex, MD;  Location: [facility];  Service: Endoscopy;;  . Joan Mayans  09/03/2019   Procedure: Joan Mayans;  Surgeon: Clarene Essex, MD;  Location: [facility];  Service: Endoscopy;;  . STENT REMOVAL  12/06/2019   Procedure: STENT REMOVAL;  Surgeon: Clarene Essex, MD;  Location: [facility];  Service: Endoscopy;;  . TUBAL LIGATION Bilateral 1980   AND RIGHT BREAST EXCISION CYST (BENIGN)    Current Medications: Current Meds  Medication Sig  . acetaminophen (TYLENOL) 650 MG CR tablet Take 650-1,300 mg by mouth every 8 (eight) hours as needed for pain.  Marland Kitchen alendronate (FOSAMAX) 10 MG tablet TAKE (1) TABLET BY MOUTH PER WEEK TAKE ON EMPTY STOMACH WITH FULL GLASS OF WATER  . allopurinol (ZYLOPRIM) 100 MG tablet TAKE 1 TABLET BY MOUTH EVERY DAY  . calcium carbonate (TUMS - DOSED IN MG ELEMENTAL CALCIUM) 500 MG chewable tablet Chew 1-2 tablets by mouth at bedtime.  Marland Kitchen CALCIUM PO Take 1 tablet by mouth daily.  . Carboxymethylcellul-Glycerin (LUBRICATING EYE DROPS OP) Place 1 drop into both eyes daily as needed (dry eyes).  . cholecalciferol (VITAMIN D3) 25 MCG (1000 UT) tablet Take 1,000 Units by mouth daily.  . clindamycin (CLINDAGEL) 1 % gel APPLY TO AFFECTED AREA TWICE A DAY  . colchicine 0.6 MG tablet Take 1 tablet (0.6 mg total) by mouth 2 (two) times daily. Take twice daily until pain resolves and then take as needed for gout flare.  . [employer] ANTI-FUNGAL 2 % powder APPLY TO AFFECTED AREA TWICE A DAY AFTER SKIN RESOLVES FROM KETOCONAZOLE  CREAM  . diazepam (VALIUM) 5 MG tablet TAKE 1/2 TABLET (2.5MG  TOTAL) BY MOUTH AT BEDTIME  . ELIQUIS 5 MG TABS tablet TAKE 1 TABLET BY MOUTH TWICE A DAY  . Flax Oil-Fish Oil-Borage Oil (FISH-FLAX-BORAGE) CAPS Take 1 capsule by mouth daily.  . furosemide (LASIX) 20 MG tablet 20 mg qod alt with 40 mg  . Garlic 361 MG TABS Take by mouth. DAILY  . glucosamine-chondroitin 500-400 MG tablet Take 1 tablet by mouth daily.   Marland Kitchen HYDROcodone-acetaminophen (NORCO/VICODIN) 5-325 MG tablet Take 1 tablet by mouth every 6 (six) hours as needed for moderate pain.  Marland Kitchen lisinopril (ZESTRIL) 20 MG tablet TAKE 1 TABLET BY MOUTH EVERYDAY AT BEDTIME (Patient taking differently: Take 10 mg by mouth daily.)  . loratadine (CLARITIN) 10 MG tablet TAKE 1 TABLET BY MOUTH EVERY DAY  . metoprolol succinate (TOPROL-XL) 100 MG 24 hr tablet Take 1 tablet (100 mg total) by  mouth daily. Take with or immediately following a meal.  . Multiple Vitamins-Minerals (EMERGEN-C BLUE PO) Take by mouth. 1000 mg vitamin c daily and b complex one tab daily  . Multiple Vitamins-Minerals (EMERGEN-C IMMUNE) PACK Take 1 packet by mouth daily.  . Multiple Vitamins-Minerals (MULTIVITAMIN WOMEN 50+) TABS Take 1 tablet by mouth daily.   . mupirocin ointment (BACTROBAN) 2 % APPLY TOPICALLY TO AFFECTED AREA TWICE DAILY  . Nutritional Supplements (ECHINACEA/GOLDEN SEAL PO) Take 1 tablet by mouth daily.   Marland Kitchen nystatin (MYCOSTATIN/NYSTOP) powder Apply 1 application topically 2 (two) times daily.  . pantoprazole (PROTONIX) 40 MG tablet Take 1 tablet (40 mg total) by mouth daily.  . polycarbophil (FIBERCON) 625 MG tablet Take 625 mg by mouth daily.  . potassium chloride (KLOR-CON) 20 MEQ packet USE 1 PACKET BY MOUTH EVERY DAY  . Probiotic Product (PROBIOTIC-10 PO) Take 1 tablet by mouth daily.   Marland Kitchen Propylene Glycol (SYSTANE COMPLETE) 0.6 % SOLN Place 1 drop into both eyes 4 (four) times daily as needed.  . RESTASIS 0.05 % ophthalmic emulsion INSTILL 1 DROP INTO  BOTH EYES TWICE A DAY  . triamcinolone (KENALOG) 0.1 % APPLY TO AFFECTED AREA TWICE A DAY  . Turmeric (QC TUMERIC COMPLEX PO) Take by mouth. WITH CONDIN DAILY     Allergies:   Flecainide acetate   Social History   Socioeconomic History  . Marital status: Widowed    Spouse name: [name]  . Number of children: 1  . Years of education: Masters  . Highest education level: Master's degree (e.g., MA, MS, MEng, MEd, MSW, MBA)  Occupational History  . Occupation: Retired- Product manager: RETIRED  Tobacco Use  . Smoking status: Never Smoker  . Smokeless tobacco: Never Used  Vaping Use  . Vaping Use: Never used  Substance and Sexual Activity  . Alcohol use: Not Currently  . Drug use: Never  . Sexual activity: Not Currently    Birth control/protection: Post-menopausal  Other Topics Concern  . Not on file  Social History Narrative   Emergency Contact: Leta Baptist (son) [phone]   Margarita Mail (friend) cell: [phone] work: [phone] *first Pratt (friend) cell: [phone] home: [phone] * second POA   Who lives with you: self   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 Exercise: Pt dances regularly for shows   Seatbelts: Pt reports wearing seatbelt when in vehicles.    Sun Exposure/Protection: Pt reports not being in the sun very much   Hobbies: dancing, painting,    Working smoke alarm: yes   Home throw rugs: yes   Home free from clutter: yes   ______________________________________________________________________________________   Current Social History     Who lives at home: lives alone, retired Education officer, museum and librarian  10/25/2019    Transportation: provided by [facility] Medicare and her female friend 10/25/2019   Important Relationships & Pets: has cats and a friend that she talks to or sees daily.  Reports no family local 10/25/2019    Current Stressors: clutter in her home  10/25/2019   Other: Unable to prepare meals and needs help with keeping her home clean.  Describes house as being cluttered with narrow paths in her trailer.  No cental heat uses space heaters for the past year. Leaking kitchen causes water bill to be higher than it should be 10/25/2019   Neoma Laming  Laurance Flatten, LCSW   Clinical Social Worker                                                                                                         Social Determinants of Health   Financial Resource Strain: Not on file  Food Insecurity: Not on file  Transportation Needs: Not on file  Physical Activity: Not on file  Stress: Not on file  Social Connections: Not on file     Family History: The patient's family history includes Alcohol abuse in her father; Cancer in her maternal aunt and mother; Heart disease in her maternal uncle; Kidney disease in her father.  ROS:   Please see the history of present illness.     All other systems reviewed and are negative.  EKGs/Labs/Other Studies Reviewed:    The following studies were reviewed today:   EKG:  EKG is  ordered today.  The ekg ordered today demonstrates   Recent Labs: 03/17/2020: TSH 3.060 06/08/2020: NT-Pro BNP 780 11/04/2020: ALT 17; BUN 18; Creatinine, Ser 1.19; Hemoglobin 12.8; Platelets 240; Potassium 3.9; Sodium 141  Recent Lipid Panel    Component Value Date/Time   CHOL 224 (H) 06/09/2014 1427   TRIG 142 06/09/2014 1427   HDL 64 06/09/2014 1427   CHOLHDL 3.5 06/09/2014 1427   VLDL 28 06/09/2014 1427   LDLCALC 132 (H) 06/09/2014 1427     Physical Exam:    VS:  BP 120/70 (BP Location: Left Arm, Patient Position: Sitting, Cuff Size: Normal)   Pulse 67   Ht 5\' 3"  (1.6 m)   Wt 209 lb (94.8 kg)   SpO2 98%   BMI 37.02 kg/m     Wt Readings from Last 3 Encounters:  12/22/20 209 lb (94.8 kg)  12/07/20 208 lb 12.8 oz (94.7 kg)  11/05/20 204 lb 12.8 oz (92.9 kg)     GEN:  Well nourished, well developed in no acute  distress HEENT: Normal NECK: No JVD; No carotid bruits LYMPHATICS: No lymphadenopathy CARDIAC: irreg, no murmurs, rubs, gallops RESPIRATORY:  Clear to auscultation without rales, wheezing or rhonchi  ABDOMEN: Soft, non-tender, non-distended MUSCULOSKELETAL: Chronic lower extremity edema; No deformity  SKIN: Warm and dry NEUROLOGIC:  Alert and oriented x 3 PSYCHIATRIC:  Normal affect   ASSESSMENT:    1. Permanent atrial fibrillation (HCC)   2. Stage 3a chronic kidney disease (HCC)    PLAN:    In order of problems listed above:  Post op cone procedure  - dysplasia.   Perm AFIB  - Rate controlled. Meds reviewed  CKD 3a -stable, improved after lisinopril was decreased by nephrology.  Essential hypertension -Very well controlled on current medications as above.  Chronic anticoagulation -Continue with Eliquis.  No bleeding.  No medication side effects.  Medication Adjustments/Labs and Tests Ordered: Current medicines are reviewed at length with the patient today.  Concerns regarding medicines are outlined above.  No orders of the defined types were placed in this encounter.  No orders of the defined types were placed in this encounter.   Patient  Instructions  Medication Instructions:  The current medical regimen is effective;  continue present plan and medications.  *If you need a refill on your cardiac medications before your next appointment, please call your pharmacy*  Follow-Up: At [facility], you and your health needs are our priority.  As part of our continuing mission to provide you with exceptional heart care, we have created designated Provider Care Teams.  These Care Teams include your primary Cardiologist (physician) and Advanced Practice Providers (APPs -  Physician Assistants and Nurse Practitioners) who all work together to provide you with the care you need, when you need it.  We recommend signing up for the patient portal called "MyChart".  Sign up  information is provided on this After Visit Summary.  MyChart is used to connect with patients for Virtual Visits (Telemedicine).  Patients are able to view lab/test results, encounter notes, upcoming appointments, etc.  Non-urgent messages can be sent to your provider as well.   To learn more about what you can do with MyChart, go to [email or web address].    Your next appointment:   12 month(s)  The format for your next appointment:   In Person  Provider:   [name], MD   Thank you for choosing [facility]!!        Signed, [name], MD  12/22/2020 5:03 PM    [place]

## 2020-12-28 DIAGNOSIS — M7582 Other shoulder lesions, left shoulder: Secondary | ICD-10-CM | POA: Diagnosis not present

## 2020-12-29 DIAGNOSIS — I8312 Varicose veins of left lower extremity with inflammation: Secondary | ICD-10-CM | POA: Diagnosis not present

## 2021-01-12 DIAGNOSIS — I8311 Varicose veins of right lower extremity with inflammation: Secondary | ICD-10-CM | POA: Diagnosis not present

## 2021-01-14 ENCOUNTER — Other Ambulatory Visit: Payer: Self-pay | Admitting: Cardiology

## 2021-01-14 ENCOUNTER — Other Ambulatory Visit: Payer: Self-pay | Admitting: Family Medicine

## 2021-01-14 DIAGNOSIS — I4821 Permanent atrial fibrillation: Secondary | ICD-10-CM

## 2021-01-14 NOTE — Telephone Encounter (Signed)
Pt last saw Dr Marlou Porch 12/22/20, last labs 11/04/20 Creat 1.19, age 80, weight 94.8kg, based on specified criteria pt is on appropriate dosage of Eliquis 5mg  BID for afib.  Will refill rx.

## 2021-01-15 ENCOUNTER — Telehealth: Payer: Self-pay | Admitting: Cardiology

## 2021-01-15 DIAGNOSIS — R82998 Other abnormal findings in urine: Secondary | ICD-10-CM | POA: Diagnosis not present

## 2021-01-15 DIAGNOSIS — R319 Hematuria, unspecified: Secondary | ICD-10-CM | POA: Diagnosis not present

## 2021-01-15 NOTE — Telephone Encounter (Signed)
Called patient back about her message. Patient stated she started bleeding on Wednesday from her surgery site that she had 3 months ago with Dr. Ulanda Edison. Patient stated she stopped her eliqius on her own and then followed up with Dr. Ulanda Edison today. Patient stated he treated her for the bleeding and hopefully it will clear up over the weekend. Patient wanted to let Dr. Marlou Porch know what was going on and that she is off her eliquis. Patient is aware that with her A. FIB and being off of Eliquis she is at risk for stroke. Patient wanted to know if Dr. Marlou Porch had any advisement for her.

## 2021-01-15 NOTE — Telephone Encounter (Signed)
New message:     Patient calling stating that she had surgery on her cervix in December. Patient just had a follow with Dr. Newton Pigg and they stated that she is good, however patient stated bleeding ago and told her to stop taking the the Eliquis, because she is bleeding. Patient calling to ask what should she do.

## 2021-01-19 DIAGNOSIS — I8312 Varicose veins of left lower extremity with inflammation: Secondary | ICD-10-CM | POA: Diagnosis not present

## 2021-01-21 NOTE — Telephone Encounter (Signed)
Left message on pt's private VM - to follow Dr Marijean Heath orders and that Dr Marlou Porch did not have any further orders for her.

## 2021-01-21 NOTE — Telephone Encounter (Signed)
Thank you for the update Khylen Riolo, MD  

## 2021-01-22 DIAGNOSIS — R82998 Other abnormal findings in urine: Secondary | ICD-10-CM | POA: Diagnosis not present

## 2021-02-16 ENCOUNTER — Other Ambulatory Visit: Payer: Self-pay | Admitting: Physician Assistant

## 2021-02-24 DIAGNOSIS — M109 Gout, unspecified: Secondary | ICD-10-CM | POA: Diagnosis not present

## 2021-02-24 DIAGNOSIS — Z1159 Encounter for screening for other viral diseases: Secondary | ICD-10-CM | POA: Diagnosis not present

## 2021-02-24 DIAGNOSIS — E559 Vitamin D deficiency, unspecified: Secondary | ICD-10-CM | POA: Diagnosis not present

## 2021-02-24 DIAGNOSIS — I509 Heart failure, unspecified: Secondary | ICD-10-CM | POA: Diagnosis not present

## 2021-02-24 DIAGNOSIS — I4891 Unspecified atrial fibrillation: Secondary | ICD-10-CM | POA: Diagnosis not present

## 2021-02-24 DIAGNOSIS — I10 Essential (primary) hypertension: Secondary | ICD-10-CM | POA: Diagnosis not present

## 2021-02-24 DIAGNOSIS — Z0000 Encounter for general adult medical examination without abnormal findings: Secondary | ICD-10-CM | POA: Diagnosis not present

## 2021-02-24 DIAGNOSIS — K5792 Diverticulitis of intestine, part unspecified, without perforation or abscess without bleeding: Secondary | ICD-10-CM | POA: Diagnosis not present

## 2021-02-24 DIAGNOSIS — D689 Coagulation defect, unspecified: Secondary | ICD-10-CM | POA: Diagnosis not present

## 2021-02-24 DIAGNOSIS — Z79899 Other long term (current) drug therapy: Secondary | ICD-10-CM | POA: Diagnosis not present

## 2021-02-24 DIAGNOSIS — K219 Gastro-esophageal reflux disease without esophagitis: Secondary | ICD-10-CM | POA: Diagnosis not present

## 2021-03-12 ENCOUNTER — Other Ambulatory Visit: Payer: Self-pay | Admitting: Family Medicine

## 2021-04-28 ENCOUNTER — Other Ambulatory Visit: Payer: Self-pay | Admitting: Student

## 2021-04-28 DIAGNOSIS — G43909 Migraine, unspecified, not intractable, without status migrainosus: Secondary | ICD-10-CM

## 2021-04-30 ENCOUNTER — Other Ambulatory Visit: Payer: Self-pay | Admitting: Student

## 2021-04-30 DIAGNOSIS — Z780 Asymptomatic menopausal state: Secondary | ICD-10-CM

## 2021-05-17 ENCOUNTER — Telehealth: Payer: Self-pay | Admitting: Cardiology

## 2021-05-17 NOTE — Telephone Encounter (Signed)
Patient called and stated that the results came back from test about heart failure and the test said that her heart failure is elevated. Both legs, feet and ankles are swelling, more in right legs. [place] is PCP. Want to know what type of exercises to do to strengthen heart.

## 2021-05-17 NOTE — Telephone Encounter (Signed)
Left a message for the pt to call back.  

## 2021-05-19 NOTE — Telephone Encounter (Signed)
Attempted again to contact pt. Left message to c/b to discuss her concerns.

## 2021-05-19 NOTE — Telephone Encounter (Signed)
Follow up:     Patient is calling to check on the status of her last message.

## 2021-05-20 ENCOUNTER — Ambulatory Visit
Admission: RE | Admit: 2021-05-20 | Discharge: 2021-05-20 | Disposition: A | Discharge status: Home / Self Care | Payer: Medicare Other | Source: Ambulatory Visit | Attending: Student | Admitting: Student

## 2021-05-20 ENCOUNTER — Other Ambulatory Visit: Payer: Self-pay | Admitting: Student

## 2021-05-20 DIAGNOSIS — G43909 Migraine, unspecified, not intractable, without status migrainosus: Secondary | ICD-10-CM

## 2021-05-20 DIAGNOSIS — R600 Localized edema: Secondary | ICD-10-CM

## 2021-05-20 NOTE — Telephone Encounter (Signed)
Patient was returning call. Please advise ?

## 2021-05-20 NOTE — Telephone Encounter (Signed)
3rd message left for Pt requesting call back to discuss questions.  Possible lab work or test done by [employer] PCP?  No documentation noted under media or in Dr. Marlou Porch box  Requested call back.

## 2021-05-25 NOTE — Telephone Encounter (Signed)
Pt has not returned call after several messages have been left for her.  Will close this encounter and await a return call from pt with any further questions or problems.

## 2021-05-28 ENCOUNTER — Ambulatory Visit
Admission: RE | Admit: 2021-05-28 | Discharge: 2021-05-28 | Disposition: A | Discharge status: Home / Self Care | Payer: Medicare Other | Source: Ambulatory Visit | Attending: Student | Admitting: Student

## 2021-05-28 ENCOUNTER — Other Ambulatory Visit: Payer: Self-pay

## 2021-05-28 DIAGNOSIS — R600 Localized edema: Secondary | ICD-10-CM

## 2021-06-01 ENCOUNTER — Ambulatory Visit: Payer: TRICARE For Life (TFL) | Admitting: Neurology

## 2021-06-01 ENCOUNTER — Other Ambulatory Visit: Payer: Self-pay | Admitting: *Deleted

## 2021-06-01 ENCOUNTER — Encounter: Payer: Self-pay | Admitting: *Deleted

## 2021-06-02 ENCOUNTER — Ambulatory Visit (INDEPENDENT_AMBULATORY_CARE_PROVIDER_SITE_OTHER): Payer: Medicare Other

## 2021-06-02 ENCOUNTER — Other Ambulatory Visit: Payer: Self-pay

## 2021-06-02 ENCOUNTER — Ambulatory Visit (INDEPENDENT_AMBULATORY_CARE_PROVIDER_SITE_OTHER): Payer: Medicare Other | Admitting: Podiatry

## 2021-06-02 DIAGNOSIS — R600 Localized edema: Secondary | ICD-10-CM | POA: Diagnosis not present

## 2021-06-02 NOTE — Progress Notes (Signed)
HPI: 80 y.o. female presenting today as a new patient for evaluation of bilateral lower extremity edema with occasional bruising.  Patient states that she notices some discoloration occasionally to the dorsum of the right foot.  PMHx edema bilateral lower extremities mostly in the summertime, CHF, CKD, and on long-term anticoagulant use.  She denies any pain to the area.  She presents for further treatment and evaluation  Past Medical History:  Diagnosis Date   Abscess of abdominal cavity ([place]) 10/02/2019   Anticoagulant long-term use    elquis -- managed by cardiology   Anxiety    SITUATIONAL IN PAST   Atrial fibrillation ([place])    Chronic calculous cholecystitis    Chronic kidney disease    ckd stage 4 per lov dr Corliss Parish 09-28-2020 on chart   Chronic venous insufficiency    w/  varicose veins right worse than left waers compresiion stocking prn   Depression    SITUATIONAL IN PAST   Diastolic CHF, chronic ([place]) 2010   followed by cardiology HX OF 2010 DUE TO FLECANIDE   Diverticulosis of colon    Dyspnea    occ with heavt activity and at bedtime when tired for last few months per pt   Dysrhythmia    a-fib   Edema of both lower extremities    per pt mostly in summer time   Essential hypertension, benign    Fibrocystic breast disease    Full dentures    GERD (gastroesophageal reflux disease)    occasional tums and does not eat prior to bedtime   Gout    08-19-2019  per pt last episode DEC 2021 LEFT HAND   Hiatal hernia    History of diverticulitis 01/22/2016   History of recurrent UTIs    NONE RECNET SEES DR WRENN   Hx of cholecystectomy 10/02/2019   Hyperlipidemia    Insomnia    Migraines    OA (osteoarthritis)    knees, lower back   Permanent atrial fibrillation [facility]) cardiologist--- dr Agustin Cree   first dx 10/ 2011--- histroy DCCV 11-25-2010 by dr Wynonia Lawman (pt's previous cardiologist) and Cardiac cath 12-17-2010 no significant disease   Pneumonia 08/2019    COLLAPSED LEFT LUNG AND PNEUMONIA   Scoliosis    Stress incontinence in female      Physical Exam: General: The patient is alert and oriented x3 in no acute distress.  Dermatology: Skin is warm, dry and supple bilateral lower extremities. Negative for open lesions or macerations.  Vascular: Palpable pedal pulses bilaterally.  Varicosities noted bilateral lower extremities.  Chronic lower extremity bilateral edema and also noted.  Pitting edema.  Neurological: Epicritic and protective threshold grossly intact bilaterally.   Musculoskeletal Exam: No pedal deformities noted  Radiographic Exam:  Normal osseous mineralization.  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  Bilateral lower extremity edema with intermittent bruising likely secondary to the long-term use of Eliquis   Plan of Care:  1. Patient evaluated. X-Rays reviewed.  2. Recommend conservative treatment at this time.  3. Recommend compression daily. 4. Compression anklets bilateral dispensed.  5. RTC PRN      Edrick Kins, DPM [facility]  Dr. Edrick Kins, DPM    [address]  [place], [place] [address]                Office [phone]  Fax [phone]

## 2021-06-07 ENCOUNTER — Other Ambulatory Visit: Payer: Self-pay | Admitting: Student

## 2021-06-07 DIAGNOSIS — Z780 Asymptomatic menopausal state: Secondary | ICD-10-CM

## 2021-06-08 ENCOUNTER — Ambulatory Visit: Payer: Medicare Other | Admitting: Diagnostic Neuroimaging

## 2021-06-10 ENCOUNTER — Other Ambulatory Visit: Payer: Self-pay

## 2021-06-10 ENCOUNTER — Ambulatory Visit (INDEPENDENT_AMBULATORY_CARE_PROVIDER_SITE_OTHER): Payer: Medicare Other | Admitting: Neurology

## 2021-06-10 ENCOUNTER — Encounter: Payer: Self-pay | Admitting: Neurology

## 2021-06-10 VITALS — BP 122/80 | HR 81 | Ht 63.0 in | Wt 198.5 lb

## 2021-06-10 DIAGNOSIS — I679 Cerebrovascular disease, unspecified: Secondary | ICD-10-CM

## 2021-06-10 DIAGNOSIS — Z8673 Personal history of transient ischemic attack (TIA), and cerebral infarction without residual deficits: Secondary | ICD-10-CM | POA: Diagnosis not present

## 2021-06-10 DIAGNOSIS — M5481 Occipital neuralgia: Secondary | ICD-10-CM | POA: Insufficient documentation

## 2021-06-10 DIAGNOSIS — R519 Headache, unspecified: Secondary | ICD-10-CM

## 2021-06-10 NOTE — Progress Notes (Signed)
[facility]  PATIENT: Kristen [name] DOB: 02-08-1941  REFERRING DOCTOR OR PCP: Cipriano Mile, NP SOURCE: Patient, notes from primary care, imaging reports, MRI images personally reviewed.  _________________________________   HISTORICAL  CHIEF COMPLAINT:  Chief Complaint  Patient presents with   New Patient (Initial Visit)    Rm 2, alone. Paper referral for migraines and stroke eval. Pt states she hasn't had a full blown" migraines in over 15 years. For acute mild HA she takes tylenol. Pt is here for further evaluation regarding her past strokes. Pt reports being stable, not having any sx.     HISTORY OF PRESENT ILLNESS:  I had the pleasure of seeing your patient, Kristen [name], at [facility] for neurologic consultation regarding her headaches and abnormal imaging studies.  She is an 80 year old woman who has had headaches over the last few months.   The pain is in the right occiput and radiates forward.   Pain increases as the day goes on and is worse if she falls asleep with her neck bent on the couch.   She notes the pain shoots up from the occiput into the temple.   No nausea or vomiting.   Moving may worsne the headache some.     She denies numbness or weakness in the arms or legs.  Gait has been stable.  These headaches are distinct from migraines she experienced in the past.  Se rarely gets these now but will take Tylenol if one occurs .  She used to take ibuprofen but stopped when she went on Xarelto.    She was concerned about the finding on MRI of chronic ischemic changes.  We discussed that she does have a couple very small strokes (lacunar) that are small enough that they may not cause any symptoms.  Additionally she has chronic microvascular ischemic change.  Even though this is more than expected for age, this would probably not cause any symptoms.  MR of the brain also show an orbital abnormality, possible an enlarged vein  bilaterally.   Since it was also on the 2017 CT scan, it is unlikely to be related to her headaches/current symptosm.  She sees ophthalmology annually.  Vascular risks:  Hypertension (well controlled since diagnosis), hyperlipidemia.   She denies h/o smoing and DM.   MRI brain 714/2022 report: 1. No acute intracranial abnormality. 2. Moderate chronic microvascular ischemic changes of the white matter and parenchymal volume loss. 3. Remote lacunar infarcts in the right side of the pons and right periventricular white matter. 4. 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 For further characterization, dedicated MRI of the orbits without and with contrast is suggested if clinically appropriate.  MRI cervical spine 05/20/2021 report: 1. Mild degenerative changes of the cervical spine, more pronounced at C5-6 and C6-7 without high-grade spinal canal stenosis at any level. 2. Mild to moderate right and mild left neural foraminal narrowing at C5-6 and mild left neural foraminal narrowing at C6-7.   Studies were personally reviewed and I concur with the reports.      REVIEW OF SYSTEMS: Constitutional: No fevers, chills, sweats, or change in appetite Eyes: No visual changes, double vision, eye pain Ear, nose and throat: No hearing loss, ear pain, nasal congestion, sore throat Cardiovascular: No chest pain, palpitations Respiratory:  No shortness of breath at rest or with exertion.   No wheezes  GastrointestinaI: No nausea, vomiting, diarrhea, abdominal pain, fecal incontinence Genitourinary:  No dysuria, urinary retention or frequency.  No nocturia. Musculoskeletal:  No neck pain, back pain Integumentary: No rash, pruritus, skin lesions Neurological: as above Psychiatric: No depression at this time.  No anxiety Endocrine: No palpitations, diaphoresis,  change in appetite, change in weigh or increased thirst Hematologic/Lymphatic:  No anemia, purpura, petechiae. Allergic/Immunologic: No itchy/runny eyes, nasal congestion, recent allergic reactions, rashes  ALLERGIES: Allergies  Allergen Reactions   Flecainide Acetate Anaphylaxis, Shortness Of Breath and Swelling     (hospitalized)    HOME MEDICATIONS:  Current Outpatient Medications:    acetaminophen (TYLENOL) 650 MG CR tablet, Take 650-1,300 mg by mouth every 8 (eight) hours as needed for pain., Disp: , Rfl:    alendronate (FOSAMAX) 10 MG tablet, TAKE (1) TABLET BY MOUTH PER WEEK TAKE ON EMPTY STOMACH WITH FULL GLASS OF WATER, Disp: 4 tablet, Rfl: 10   allopurinol (ZYLOPRIM) 100 MG tablet, TAKE 1 TABLET BY MOUTH EVERY DAY, Disp: 90 tablet, Rfl: 1   calcium carbonate (TUMS - DOSED IN MG ELEMENTAL CALCIUM) 500 MG chewable tablet, Chew 1-2 tablets by mouth at bedtime., Disp: , Rfl:    CALCIUM PO, Take 1 tablet by mouth daily., Disp: , Rfl:    Carboxymethylcellul-Glycerin (LUBRICATING EYE DROPS OP), Place 1 drop into both eyes daily as needed (dry eyes)., Disp: , Rfl:    cholecalciferol (VITAMIN D3) 25 MCG (1000 UT) tablet, Take 1,000 Units by mouth daily., Disp: , Rfl:    clindamycin (CLINDAGEL) 1 % gel, APPLY TO AFFECTED AREA TWICE A DAY, Disp: 30 g, Rfl: 0   colchicine 0.6 MG tablet, Take 1 tablet (0.6 mg total) by mouth 2 (two) times daily. Take twice daily until pain resolves and then take as needed for gout flare., Disp: 60 tablet, Rfl: 1   [employer] ANTI-FUNGAL 2 % powder, APPLY TO AFFECTED AREA TWICE A DAY AFTER SKIN RESOLVES FROM KETOCONAZOLE CREAM, Disp: 85 g, Rfl: 1   diazepam (VALIUM) 5 MG tablet, TAKE 1/2 TABLET (2.5MG TOTAL) BY MOUTH AT BEDTIME, Disp: 30 tablet, Rfl: 0   ELIQUIS 5 MG TABS tablet, TAKE 1 TABLET BY MOUTH TWICE A DAY, Disp: 180 tablet, Rfl: 1   Flax Oil-Fish Oil-Borage Oil (FISH-FLAX-BORAGE) CAPS, Take 1 capsule by mouth daily., Disp: , Rfl:    furosemide (LASIX) 20 MG  tablet, 20 mg qod alt with 40 mg (Patient taking differently: Take 40 mg by mouth daily.), Disp: 135 tablet, Rfl: 2   Garlic 366 MG TABS, Take by mouth. DAILY, Disp: , Rfl:    glucosamine-chondroitin 500-400 MG tablet, Take 1 tablet by mouth daily. , Disp: , Rfl:    HYDROcodone-acetaminophen (NORCO/VICODIN) 5-325 MG tablet, Take 1 tablet by mouth every 6 (six) hours as needed for moderate pain., Disp: 10 tablet, Rfl: 0   lisinopril (ZESTRIL) 20 MG tablet, TAKE 1 TABLET BY MOUTH EVERYDAY AT BEDTIME (Patient taking differently: Take 10 mg by mouth daily.), Disp: 90 tablet, Rfl: 0   loratadine (CLARITIN) 10 MG tablet, TAKE 1 TABLET BY MOUTH EVERY DAY, Disp: 90 tablet, Rfl: 1   Multiple Vitamins-Minerals (EMERGEN-C IMMUNE) PACK, Take 1 packet by mouth daily., Disp: , Rfl:    Multiple Vitamins-Minerals (MULTIVITAMIN WOMEN 50+) TABS, Take 1 tablet by mouth daily. , Disp: , Rfl:    mupirocin ointment (BACTROBAN) 2 %, APPLY TOPICALLY TO AFFECTED AREA TWICE DAILY, Disp: 22 g, Rfl: 10   Nutritional Supplements (ECHINACEA/GOLDEN SEAL PO), Take 1 tablet by  mouth daily. , Disp: , Rfl:    nystatin (MYCOSTATIN/NYSTOP) powder, Apply 1 application topically 2 (two) times daily., Disp: 15 g, Rfl: 0   pantoprazole (PROTONIX) 40 MG tablet, Take 1 tablet (40 mg total) by mouth daily., Disp:  , Rfl:    polycarbophil (FIBERCON) 625 MG tablet, Take 625 mg by mouth daily., Disp: , Rfl:    potassium chloride (KLOR-CON) 20 MEQ packet, USE 1 PACKET BY MOUTH EVERY DAY, Disp: 90 packet, Rfl: 3   Probiotic Product (PROBIOTIC-10 PO), Take 1 tablet by mouth daily. , Disp: , Rfl:    Propylene Glycol (SYSTANE COMPLETE) 0.6 % SOLN, Place 1 drop into both eyes 4 (four) times daily as needed., Disp: , Rfl:    RESTASIS 0.05 % ophthalmic emulsion, INSTILL 1 DROP INTO BOTH EYES TWICE A DAY, Disp: 60 mL, Rfl: 0   rosuvastatin (CRESTOR) 5 MG tablet, Take 5 mg by mouth at bedtime., Disp: , Rfl:    triamcinolone cream (KENALOG) 0.1 %, APPLY  TO AFFECTED AREA TWICE A DAY, Disp: 30 g, Rfl: 0   Turmeric (QC TUMERIC COMPLEX PO), Take by mouth. WITH CONDIN DAILY, Disp: , Rfl:    metoprolol succinate (TOPROL-XL) 100 MG 24 hr tablet, Take 1 tablet (100 mg total) by mouth daily. Take with or immediately following a meal., Disp: 90 tablet, Rfl: 2  PAST MEDICAL HISTORY: Past Medical History:  Diagnosis Date   Abscess of abdominal cavity ([place]) 10/02/2019   Anticoagulant long-term use    elquis -- managed by cardiology   Anxiety    SITUATIONAL IN PAST   Atrial fibrillation ([place])    Chronic calculous cholecystitis    Chronic kidney disease    ckd stage 4 per lov dr Corliss Parish 09-28-2020 on chart   Chronic venous insufficiency    w/  varicose veins right worse than left waers compresiion stocking prn   Depression    SITUATIONAL IN PAST   Diastolic CHF, chronic ([place]) 2010   followed by cardiology HX OF 2010 DUE TO FLECANIDE   Diverticulosis of colon    Dyspnea    occ with heavt activity and at bedtime when tired for last few months per pt   Dysrhythmia    a-fib   Edema of both lower extremities    per pt mostly in summer time   Essential hypertension, benign    Fibrocystic breast disease    Full dentures    GERD (gastroesophageal reflux disease)    occasional tums and does not eat prior to bedtime   Gout    08-19-2019  per pt last episode DEC 2021 LEFT HAND   Hiatal hernia    History of diverticulitis 01/22/2016   History of recurrent UTIs    NONE RECNET SEES DR WRENN   Hx of cholecystectomy 10/02/2019   Hyperlipidemia    Insomnia    Migraines    OA (osteoarthritis)    knees, lower back   Permanent atrial fibrillation [facility]) cardiologist--- dr Agustin Cree   first dx 10/ 2011--- histroy DCCV 11-25-2010 by dr Wynonia Lawman (pt's previous cardiologist) and Cardiac cath 12-17-2010 no significant disease   Pneumonia 08/2019   COLLAPSED LEFT LUNG AND PNEUMONIA   Scoliosis    Stress incontinence in female     PAST SURGICAL  HISTORY: Past Surgical History:  Procedure Laterality Date   BILIARY STENT PLACEMENT N/A 09/03/2019   Procedure: BILIARY STENT PLACEMENT;  Surgeon: Clarene Essex, MD;  Location: [facility];  Service: Endoscopy;  Laterality: N/A;  BLEPHAROPLASTY Bilateral 02-22-2010  dr Georgia Lopes   upper eyelid's   both knees cortisone injection  08/18/2020   dr Noemi Chapel   BUNIONECTOMY Right 2003   CATARACT EXTRACTION W/ INTRAOCULAR LENS  IMPLANT, BILATERAL  2018   CERVICAL CONIZATION W/BX N/A 11/05/2020   Procedure: CONIZATION CERVIX WITH BIOPSY;  Surgeon: Newton Pigg, MD;  Location: [place];  Service: Gynecology;  Laterality: N/A;   CHOLECYSTECTOMY N/A 08/22/2019   Procedure: LAPAROSCOPIC CHOLECYSTECTOMY;  Surgeon: [name], [name], MD;  Location: [facility];  Service: General;  Laterality: N/A;   ERCP N/A 09/03/2019   Procedure: ENDOSCOPIC RETROGRADE CHOLANGIOPANCREATOGRAPHY (ERCP);  Surgeon: Clarene Essex, MD;  Location: [facility];  Service: Endoscopy;  Laterality: N/A;   ESOPHAGOGASTRODUODENOSCOPY (EGD) WITH PROPOFOL N/A 12/06/2019   Procedure: ESOPHAGOGASTRODUODENOSCOPY (EGD) WITH PROPOFOL;  Surgeon: Clarene Essex, MD;  Location: [facility];  Service: Endoscopy;  Laterality: N/A;  stent removal, ERCP Scope, needs Flouro   HYSTEROSCOPY WITH D & C N/A 11/05/2020   Procedure: DILATATION AND CURETTAGE /HYSTEROSCOPY WITH MYOSURE;  Surgeon: Newton Pigg, MD;  Location: [place];  Service: Gynecology;  Laterality: N/A;   IR SINUS/FIST TUBE CHK-NON GI  09/10/2019   IR SINUS/FIST TUBE CHK-NON GI  09/24/2019   KNEE ARTHROSCOPY Left 2002   meniscus repair   REMOVAL OF STONES  09/03/2019   Procedure: REMOVAL OF STONES;  Surgeon: Clarene Essex, MD;  Location: [facility];  Service: Endoscopy;;   SPHINCTEROTOMY  09/03/2019   Procedure: Joan Mayans;  Surgeon: Clarene Essex, MD;  Location: [facility];  Service: Endoscopy;;   STENT REMOVAL  12/06/2019    Procedure: STENT REMOVAL;  Surgeon: Clarene Essex, MD;  Location: [facility];  Service: Endoscopy;;   TUBAL LIGATION Bilateral 1980   AND RIGHT BREAST EXCISION CYST (BENIGN)    FAMILY HISTORY: Family History  Problem Relation Age of Onset   Cancer Mother        ovarian   Kidney disease Father    Alcohol abuse Father    Cancer Maternal Aunt        lung- smoker   Heart disease Maternal Uncle     SOCIAL HISTORY:  Social History   Socioeconomic History   Marital status: Widowed    Spouse name: [name]   Number of children: 1   Years of education: Masters   Highest education level: Master's degree (e.g., MA, MS, MEng, MEd, MSW, MBA)  Occupational History   Occupation: Retired- Product manager: RETIRED  Tobacco Use   Smoking status: Never   Smokeless tobacco: Never  Vaping Use   Vaping Use: Never used  Substance and Sexual Activity   Alcohol use: Not Currently   Drug use: Never   Sexual activity: Not Currently    Birth control/protection: Post-menopausal  Other Topics Concern   Not on file  Social History Narrative   Emergency Contact: Leta Baptist (son) [phone]   Margarita Mail (friend) cell: [phone] work: [phone] *first [place] (friend) cell: [phone] home: [phone] * second [place]   Who lives with you: self   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 Exercise: Pt dances regularly for shows   Seatbelts: Pt reports wearing seatbelt when in vehicles.    Sun Exposure/Protection: Pt reports not being in the sun very much   Hobbies: dancing, painting,    Working smoke alarm: yes  Home throw rugs: yes   Home free from clutter: yes   ______________________________________________________________________________________   Current Social History     Who lives at home: lives alone, retired Education officer, museum and librarian  10/25/2019    Transportation: provided by [facility]  Medicare and her female friend 10/25/2019   Important Relationships & Pets: has cats and a friend that she talks to or sees daily.  Reports no family local 10/25/2019    Current Stressors: clutter in her home 10/25/2019   Other: Unable to prepare meals and needs help with keeping her home clean.  Describes house as being cluttered with narrow paths in her trailer.  No cental heat uses space heaters for the past year. Leaking kitchen causes water bill to be higher than it should be 10/25/2019   Casimer Lanius, LCSW   Clinical Social Worker                                                     Caffeine: 1-2 cup of coffee a day      Right handed       Lives alone w 4 cats                                                   Social Determinants of Health   Financial Resource Strain: Not on file  Food Insecurity: Not on file  Transportation Needs: Not on file  Physical Activity: Not on file  Stress: Not on file  Social Connections: Not on file  Intimate Partner Violence: Not on file     PHYSICAL EXAM  Vitals:   06/10/21 1334  BP: 122/80  Pulse: 81  Weight: 198 lb 8 oz (90 kg)  Height: _0  (1.6 m)    Body mass index is 35.16 kg/m.   General: The patient is well-developed and well-nourished and in no acute distress  HEENT:  Head is [place]/AT.  Sclera are anicteric.  Funduscopic exam shows normal optic discs and retinal vessels.  Neck: No carotid bruits are noted.  The neck is nontender.  Cardiovascular: The heart has a regular rate and rhythm with a normal S1 and S2. There were no murmurs, gallops or rubs.    Skin: Extremities are without rash or  edema.  Musculoskeletal:  Back is nontender  Neurologic Exam  Mental status: The patient is alert and oriented x 3 at the time of the examination. The patient has apparent normal recent and remote memory, with an apparently normal attention span and concentration ability.   Speech is normal.  Cranial nerves: Extraocular movements are  full. Pupils are equal, round, and reactive to light and accomodation.  Visual fields are full.  Facial symmetry is present. There is good facial sensation to soft touch bilaterally.Facial strength is normal.  Trapezius and sternocleidomastoid strength is normal. No dysarthria is noted.  The tongue is midline, and the patient has symmetric elevation of the soft palate. No obvious hearing deficits are noted.  Motor:  Muscle bulk is normal.   Tone is normal. Strength is  5 / 5 in all 4 extremities.   Sensory: Sensory testing is intact to pinprick, soft touch and vibration sensation in all  4 extremities.  Coordination: Cerebellar testing reveals good finger-nose-finger and heel-to-shin bilaterally.  Gait and station: Station is normal.   Gait is slightly wide.  Tandem gait is wide.. Romberg is negative.   Reflexes: Deep tendon reflexes are symmetric and normal bilaterally.   Plantar responses are flexor.    DIAGNOSTIC DATA (LABS, IMAGING, TESTING) - I reviewed patient records, labs, notes, testing and imaging myself where available.  Lab Results  Component Value Date   WBC 8.9 11/04/2020   HGB 12.8 11/04/2020   HCT 40.4 11/04/2020   MCV 96.9 11/04/2020   PLT 240 11/04/2020      Component Value Date/Time   NA 141 11/04/2020 1338   NA 138 07/08/2020 1632   K 3.9 11/04/2020 1338   CL 103 11/04/2020 1338   CO2 28 11/04/2020 1338   GLUCOSE 101 (H) 11/04/2020 1338   BUN 18 11/04/2020 1338   BUN 53 (H) 07/08/2020 1632   CREATININE 1.19 (H) 11/04/2020 1338   CREATININE 1.19 (H) 10/27/2016 1215   CALCIUM 9.3 11/04/2020 1338   PROT 7.7 11/04/2020 1338   ALBUMIN 3.9 11/04/2020 1338   AST 26 11/04/2020 1338   ALT 17 11/04/2020 1338   ALKPHOS 78 11/04/2020 1338   BILITOT 0.8 11/04/2020 1338   GFRNONAA 46 (L) 11/04/2020 1338   GFRNONAA 44 (L) 10/27/2016 1215   GFRAA 32 (L) 07/08/2020 1632   GFRAA 51 (L) 10/27/2016 1215   Lab Results  Component Value Date   CHOL 224 (H) 06/09/2014    HDL 64 06/09/2014   LDLCALC 132 (H) 06/09/2014   TRIG 142 06/09/2014   CHOLHDL 3.5 06/09/2014   Lab Results  Component Value Date   HGBA1C 5.8 (H) 07/24/2020   Lab Results  Component Value Date   VITAMINB12 601 03/17/2020   Lab Results  Component Value Date   TSH 3.060 03/17/2020       ASSESSMENT AND PLAN  Chronic daily headache - Plan: Sedimentation rate, C-reactive protein  Occipital neuralgia of right side  History of lacunar cerebrovascular accident - Plan: Sedimentation rate, C-reactive protein, VAS US CAROTID  Cerebrovascular small vessel disease - Plan: VAS US CAROTID   In summary, Kristen [name] is an 80 year old woman with headaches that started about a month ago.  The characteristics is more consistent with occipital neuralgia rather than migraine.   I did a splenius capitus trigger point injection with 60 mg Depomedrol in 3 cc Marcaine using sterile technique.    She had more chronic ischemic changes on brain MRI than would be expected for her age.  Hypertension is a risk factor though she reports being well controlled.  She does not have other vascular risk factors such as smoking or diabetes mellitus.  Some people do have more than expected amounts of chronic blood pressure ischemic changes without a good explanation.  However,  we will check ESR and CRP to assess for GCA and also check carotid doppler to rule out significant stenosis.    She will return to see me as needed based on the response to the injection based on the results of the studies.  If headaches do not improve we could consider adding a low-dose nortriptyline or zonisamide to see if 1 of these will help the headaches.  Thank you for asking to see this patient.  Please let me know if I can be of further assistance with her or other patients in the future.     Mechell Girgis A. Felecia Shelling, MD, [facility] 06/10/2021, 1:47  PM Certified in Neurology, Clinical Neurophysiology, Sleep Medicine and  Neuroimaging  [facility] [address], [place], [place] [address] [phone]

## 2021-06-11 LAB — C-REACTIVE PROTEIN: CRP: 3 mg/L (ref 0–10)

## 2021-06-11 LAB — SEDIMENTATION RATE: Sed Rate: 11 mm/hr (ref 0–40)

## 2021-06-15 ENCOUNTER — Ambulatory Visit (HOSPITAL_COMMUNITY)
Admission: RE | Admit: 2021-06-15 | Discharge: 2021-06-15 | Disposition: A | Discharge status: Home / Self Care | Payer: Medicare Other | Source: Ambulatory Visit | Attending: Cardiology | Admitting: Cardiology

## 2021-06-15 ENCOUNTER — Encounter (HOSPITAL_COMMUNITY): Payer: Medicare Other

## 2021-06-15 ENCOUNTER — Other Ambulatory Visit: Payer: Self-pay

## 2021-06-15 ENCOUNTER — Telehealth: Payer: Self-pay | Admitting: Neurology

## 2021-06-15 DIAGNOSIS — I679 Cerebrovascular disease, unspecified: Secondary | ICD-10-CM

## 2021-06-15 DIAGNOSIS — Z8673 Personal history of transient ischemic attack (TIA), and cerebral infarction without residual deficits: Secondary | ICD-10-CM | POA: Diagnosis not present

## 2021-06-15 NOTE — Telephone Encounter (Signed)
-----   Message from Britt Bottom, MD sent at 06/11/2021 12:59 PM EDT ----- Please let the patient know that the lab work is fine.

## 2021-06-15 NOTE — Telephone Encounter (Signed)
Called the patient and reviewed the lab results with her being in normal range. Pt verbalized understanding. Pt is going for testing today that he ordered. Advised that once we have those results we will call her as well. Pt verbalized understanding. Pt had no questions at this time but was encouraged to call back if questions arise.

## 2021-06-16 ENCOUNTER — Telehealth: Payer: Self-pay | Admitting: *Deleted

## 2021-06-16 NOTE — Telephone Encounter (Signed)
Called and LVM for pt about results per Dr. Garth Bigness note. Advised her to call back if she has any questions/concerns.

## 2021-06-16 NOTE — Telephone Encounter (Signed)
-----   Message from Britt Bottom, MD sent at 06/15/2021  6:25 PM EDT ----- Please let her know that the carotid ultrasound was normal for age with no blockage of the arteries

## 2021-06-24 ENCOUNTER — Other Ambulatory Visit: Payer: Medicare Other

## 2021-06-29 ENCOUNTER — Other Ambulatory Visit: Payer: Self-pay | Admitting: Physician Assistant

## 2021-06-29 ENCOUNTER — Other Ambulatory Visit: Payer: Self-pay | Admitting: Cardiology

## 2021-06-29 ENCOUNTER — Other Ambulatory Visit: Payer: Self-pay | Admitting: Family Medicine

## 2021-07-22 ENCOUNTER — Other Ambulatory Visit: Payer: Self-pay | Admitting: Cardiology

## 2021-07-22 ENCOUNTER — Other Ambulatory Visit: Payer: Self-pay | Admitting: Family Medicine

## 2021-07-22 DIAGNOSIS — I5032 Chronic diastolic (congestive) heart failure: Secondary | ICD-10-CM

## 2021-07-28 ENCOUNTER — Ambulatory Visit
Admission: EM | Admit: 2021-07-28 | Discharge: 2021-07-28 | Disposition: A | Discharge status: Home / Self Care | Payer: Medicare Other | Attending: Urgent Care | Admitting: Urgent Care

## 2021-07-28 ENCOUNTER — Other Ambulatory Visit: Payer: Self-pay

## 2021-07-28 ENCOUNTER — Encounter: Payer: Self-pay | Admitting: Emergency Medicine

## 2021-07-28 DIAGNOSIS — H6011 Cellulitis of right external ear: Secondary | ICD-10-CM | POA: Diagnosis not present

## 2021-07-28 DIAGNOSIS — H9201 Otalgia, right ear: Secondary | ICD-10-CM

## 2021-07-28 MED ORDER — DOXYCYCLINE HYCLATE 100 MG PO CAPS: 100.0000 mg | ORAL_CAPSULE | Freq: Two times a day (BID) | ORAL | 0 refills | Status: DC

## 2021-07-28 NOTE — ED Provider Notes (Signed)
[place]   MRN: [id number] DOB: 25-Jun-1941  Subjective:   Kristen [name] is a 80 y.o. female presenting for 3 day history of acute onset worsening right ear lobe pain with redness, swelling. Symptoms now spreading to the neck. Has felt ill all day. Started after her earring got hooked into her dress and pulled on her ear. Denies fever, n/v, abdominal pain, chest pain, shob. She does well with doxycycline. Has a history of atrial fibrillation, chf, chronic venous insufficiency.   No current facility-administered medications for this encounter.  Current Outpatient Medications:    acetaminophen (TYLENOL) 650 MG CR tablet, Take 650-1,300 mg by mouth every 8 (eight) hours as needed for pain., Disp: , Rfl:    alendronate (FOSAMAX) 10 MG tablet, TAKE (1) TABLET BY MOUTH PER WEEK TAKE ON EMPTY STOMACH WITH FULL GLASS OF WATER, Disp: 4 tablet, Rfl: 10   allopurinol (ZYLOPRIM) 100 MG tablet, TAKE 1 TABLET BY MOUTH EVERY DAY, Disp: 90 tablet, Rfl: 1   calcium carbonate (TUMS - DOSED IN MG ELEMENTAL CALCIUM) 500 MG chewable tablet, Chew 1-2 tablets by mouth at bedtime., Disp: , Rfl:    CALCIUM PO, Take 1 tablet by mouth daily., Disp: , Rfl:    Carboxymethylcellul-Glycerin (LUBRICATING EYE DROPS OP), Place 1 drop into both eyes daily as needed (dry eyes)., Disp: , Rfl:    cholecalciferol (VITAMIN D3) 25 MCG (1000 UT) tablet, Take 1,000 Units by mouth daily., Disp: , Rfl:    clindamycin (CLINDAGEL) 1 % gel, APPLY TO AFFECTED AREA TWICE A DAY, Disp: 30 g, Rfl: 0   ELIQUIS 5 MG TABS tablet, TAKE 1 TABLET BY MOUTH TWICE A DAY, Disp: 180 tablet, Rfl: 1   Flax Oil-Fish Oil-Borage Oil (FISH-FLAX-BORAGE) CAPS, Take 1 capsule by mouth daily., Disp: , Rfl:    furosemide (LASIX) 20 MG tablet, TAKE 1 TABLET BY MOUTH EVERY OTHER DAY ALTERNATING WITH 2 TABLETS EVERY OTHER DAY, Disp: 135 tablet, Rfl: 2   Garlic 852 MG TABS, Take by mouth. DAILY, Disp: , Rfl:    glucosamine-chondroitin 500-400 MG  tablet, Take 1 tablet by mouth daily. , Disp: , Rfl:    lisinopril (ZESTRIL) 20 MG tablet, TAKE 1 TABLET BY MOUTH EVERYDAY AT BEDTIME, Disp: 90 tablet, Rfl: 0   loratadine (CLARITIN) 10 MG tablet, TAKE 1 TABLET BY MOUTH EVERY DAY, Disp: 90 tablet, Rfl: 1   metoprolol succinate (TOPROL-XL) 100 MG 24 hr tablet, TAKE 1 TABLET BY MOUTH DAILY. TAKE WITH OR IMMEDIATELY FOLLOWING A MEAL., Disp: 90 tablet, Rfl: 2   Multiple Vitamins-Minerals (EMERGEN-C IMMUNE) PACK, Take 1 packet by mouth daily., Disp: , Rfl:    Multiple Vitamins-Minerals (MULTIVITAMIN WOMEN 50+) TABS, Take 1 tablet by mouth daily. , Disp: , Rfl:    mupirocin ointment (BACTROBAN) 2 %, APPLY TOPICALLY TO AFFECTED AREA TWICE DAILY, Disp: 22 g, Rfl: 10   Nutritional Supplements (ECHINACEA/GOLDEN SEAL PO), Take 1 tablet by mouth daily. , Disp: , Rfl:    nystatin (MYCOSTATIN/NYSTOP) powder, Apply 1 application topically 2 (two) times daily., Disp: 15 g, Rfl: 0   pantoprazole (PROTONIX) 40 MG tablet, Take 1 tablet (40 mg total) by mouth daily., Disp:  , Rfl:    polycarbophil (FIBERCON) 625 MG tablet, Take 625 mg by mouth daily., Disp: , Rfl:    potassium chloride (KLOR-CON) 20 MEQ packet, USE 1 PACKET BY MOUTH EVERY DAY, Disp: 90 packet, Rfl: 3   Probiotic Product (PROBIOTIC-10 PO), Take 1 tablet by mouth daily. , Disp: ,  Rfl:    Propylene Glycol (SYSTANE COMPLETE) 0.6 % SOLN, Place 1 drop into both eyes 4 (four) times daily as needed., Disp: , Rfl:    RESTASIS 0.05 % ophthalmic emulsion, INSTILL 1 DROP INTO BOTH EYES TWICE A DAY, Disp: 60 mL, Rfl: 0   rosuvastatin (CRESTOR) 5 MG tablet, Take 5 mg by mouth at bedtime., Disp: , Rfl:    triamcinolone cream (KENALOG) 0.1 %, APPLY TO AFFECTED AREA TWICE A DAY, Disp: 30 g, Rfl: 0   Turmeric (QC TUMERIC COMPLEX PO), Take by mouth. WITH CONDIN DAILY, Disp: , Rfl:    colchicine 0.6 MG tablet, Take 1 tablet (0.6 mg total) by mouth 2 (two) times daily. Take twice daily until pain resolves and then take as  needed for gout flare., Disp: 60 tablet, Rfl: 1   [employer] ANTI-FUNGAL 2 % powder, APPLY TO AFFECTED AREA TWICE A DAY AFTER SKIN RESOLVES FROM KETOCONAZOLE CREAM, Disp: 85 g, Rfl: 1   diazepam (VALIUM) 5 MG tablet, TAKE 1/2 TABLET (2.5MG  TOTAL) BY MOUTH AT BEDTIME, Disp: 30 tablet, Rfl: 0   HYDROcodone-acetaminophen (NORCO/VICODIN) 5-325 MG tablet, Take 1 tablet by mouth every 6 (six) hours as needed for moderate pain., Disp: 10 tablet, Rfl: 0   Allergies  Allergen Reactions   Flecainide Acetate Anaphylaxis, Shortness Of Breath and Swelling     (hospitalized)    Past Medical History:  Diagnosis Date   Abscess of abdominal cavity (HCC) 10/02/2019   Anticoagulant long-term use    elquis -- managed by cardiology   Anxiety    SITUATIONAL IN PAST   Atrial fibrillation ([place])    Chronic calculous cholecystitis    Chronic kidney disease    ckd stage 4 per lov dr Corliss Parish 09-28-2020 on chart   Chronic venous insufficiency    w/  varicose veins right worse than left waers compresiion stocking prn   Depression    SITUATIONAL IN PAST   Diastolic CHF, chronic ([place]) 2010   followed by cardiology HX OF 2010 DUE TO FLECANIDE   Diverticulosis of colon    Dyspnea    occ with heavt activity and at bedtime when tired for last few months per pt   Dysrhythmia    a-fib   Edema of both lower extremities    per pt mostly in summer time   Essential hypertension, benign    Fibrocystic breast disease    Full dentures    GERD (gastroesophageal reflux disease)    occasional tums and does not eat prior to bedtime   Gout    08-19-2019  per pt last episode DEC 2021 LEFT HAND   Hiatal hernia    History of diverticulitis 01/22/2016   History of recurrent UTIs    NONE RECNET SEES DR WRENN   Hx of cholecystectomy 10/02/2019   Hyperlipidemia    Insomnia    Migraines    OA (osteoarthritis)    knees, lower back   Permanent atrial fibrillation [facility]) cardiologist--- dr Agustin Cree   first dx 10/  2011--- histroy DCCV 11-25-2010 by dr Wynonia Lawman (pt's previous cardiologist) and Cardiac cath 12-17-2010 no significant disease   Pneumonia 08/2019   COLLAPSED LEFT LUNG AND PNEUMONIA   Scoliosis    Stress incontinence in female      Past Surgical History:  Procedure Laterality Date   BILIARY STENT PLACEMENT N/A 09/03/2019   Procedure: BILIARY STENT PLACEMENT;  Surgeon: Clarene Essex, MD;  Location: [facility];  Service: Endoscopy;  Laterality: N/A;   BLEPHAROPLASTY  Bilateral 02-22-2010  dr Georgia Lopes   upper eyelid's   both knees cortisone injection  08/18/2020   dr Noemi Chapel   BUNIONECTOMY Right 2003   CATARACT EXTRACTION W/ INTRAOCULAR LENS  IMPLANT, BILATERAL  2018   CERVICAL CONIZATION W/BX N/A 11/05/2020   Procedure: CONIZATION CERVIX WITH BIOPSY;  Surgeon: Newton Pigg, MD;  Location: [facility];  Service: Gynecology;  Laterality: N/A;   CHOLECYSTECTOMY N/A 08/22/2019   Procedure: LAPAROSCOPIC CHOLECYSTECTOMY;  Surgeon: [name], [name], MD;  Location: [facility];  Service: General;  Laterality: N/A;   ERCP N/A 09/03/2019   Procedure: ENDOSCOPIC RETROGRADE CHOLANGIOPANCREATOGRAPHY (ERCP);  Surgeon: Clarene Essex, MD;  Location: [facility];  Service: Endoscopy;  Laterality: N/A;   ESOPHAGOGASTRODUODENOSCOPY (EGD) WITH PROPOFOL N/A 12/06/2019   Procedure: ESOPHAGOGASTRODUODENOSCOPY (EGD) WITH PROPOFOL;  Surgeon: Clarene Essex, MD;  Location: [facility];  Service: Endoscopy;  Laterality: N/A;  stent removal, ERCP Scope, needs Flouro   HYSTEROSCOPY WITH D & C N/A 11/05/2020   Procedure: DILATATION AND CURETTAGE /HYSTEROSCOPY WITH MYOSURE;  Surgeon: Newton Pigg, MD;  Location: [place];  Service: Gynecology;  Laterality: N/A;   IR SINUS/FIST TUBE CHK-NON GI  09/10/2019   IR SINUS/FIST TUBE CHK-NON GI  09/24/2019   KNEE ARTHROSCOPY Left 2002   meniscus repair   REMOVAL OF STONES  09/03/2019   Procedure: REMOVAL OF STONES;  Surgeon:  Clarene Essex, MD;  Location: [facility];  Service: Endoscopy;;   SPHINCTEROTOMY  09/03/2019   Procedure: Joan Mayans;  Surgeon: Clarene Essex, MD;  Location: [facility];  Service: Endoscopy;;   STENT REMOVAL  12/06/2019   Procedure: STENT REMOVAL;  Surgeon: Clarene Essex, MD;  Location: [facility];  Service: Endoscopy;;   TUBAL LIGATION Bilateral 1980   AND RIGHT BREAST EXCISION CYST (BENIGN)    Family History  Problem Relation Age of Onset   Cancer Mother        ovarian   Kidney disease Father    Alcohol abuse Father    Cancer Maternal Aunt        lung- smoker   Heart disease Maternal Uncle     Social History   Tobacco Use   Smoking status: Never   Smokeless tobacco: Never  Vaping Use   Vaping Use: Never used  Substance Use Topics   Alcohol use: Not Currently   Drug use: Never    ROS   Objective:   Vitals: BP 131/84 (BP Location: Left Arm)   Pulse 83   Temp 99.2 F (37.3 C) (Oral)   Resp 16   SpO2 94%   Physical Exam Constitutional:      General: She is not in acute distress.    Appearance: Normal appearance. She is well-developed. She is not ill-appearing.  HENT:     Head: Normocephalic and atraumatic.      Right Ear: Tympanic membrane and ear canal normal. No drainage or tenderness. No middle ear effusion. Tympanic membrane is not erythematous.     Left Ear: Tympanic membrane and ear canal normal. No drainage or tenderness.  No middle ear effusion. Tympanic membrane is not erythematous.     Nose: Nose normal. No congestion or rhinorrhea.     Mouth/Throat:     Mouth: Mucous membranes are moist. No oral lesions.     Pharynx: Oropharynx is clear. No pharyngeal swelling, oropharyngeal exudate, posterior oropharyngeal erythema or uvula swelling.     Tonsils: No tonsillar exudate or tonsillar abscesses.  Eyes:     General:  No scleral icterus.    Extraocular Movements: Extraocular movements intact.     Right eye: Normal extraocular motion.     Left eye:  Normal extraocular motion.     Conjunctiva/sclera: Conjunctivae normal.     Pupils: Pupils are equal, round, and reactive to light.  Cardiovascular:     Rate and Rhythm: Normal rate.  Pulmonary:     Effort: Pulmonary effort is normal.  Musculoskeletal:     Cervical back: Normal range of motion and neck supple.  Lymphadenopathy:     Cervical: No cervical adenopathy.  Skin:    General: Skin is warm and dry.  Neurological:     General: No focal deficit present.     Mental Status: She is alert and oriented to person, place, and time.  Psychiatric:        Mood and Affect: Mood normal.        Behavior: Behavior normal.      Assessment and Plan :   PDMP not reviewed this encounter.  1. Cellulitis of ear, right   2. Earlobe pain, right     Start doxycycline for cellulitis, use APAP for pain. Counseled patient on potential for adverse effects with medications prescribed/recommended today, ER and return-to-clinic precautions discussed, patient verbalized understanding.    Jaynee Eagles, [place] 07/28/21 1840

## 2021-07-28 NOTE — Discharge Instructions (Addendum)
Start taking doxycycline for your infection. Please just use Tylenol at a dose of 500mg -650mg  once every 6 hours as needed for your aches, pains, fevers. Do not use any nonsteroidal anti-inflammatories (NSAIDs) like ibuprofen, Motrin, naproxen, Aleve, etc. which are all available over-the-counter.

## 2021-07-28 NOTE — ED Triage Notes (Signed)
3 days ago got earring hooked onto dress and pulled her earlobe. Right earlobe now red, swollen, and patient feels like it may be spreading into her neck.

## 2021-08-05 ENCOUNTER — Other Ambulatory Visit: Payer: Self-pay | Admitting: Cardiology

## 2021-08-10 ENCOUNTER — Encounter: Payer: Self-pay | Admitting: Emergency Medicine

## 2021-08-10 ENCOUNTER — Ambulatory Visit
Admission: EM | Admit: 2021-08-10 | Discharge: 2021-08-10 | Disposition: A | Discharge status: Home / Self Care | Payer: Medicare Other | Attending: Physician Assistant | Admitting: Physician Assistant

## 2021-08-10 ENCOUNTER — Ambulatory Visit: Payer: TRICARE For Life (TFL) | Admitting: Neurology

## 2021-08-10 ENCOUNTER — Other Ambulatory Visit: Payer: Self-pay

## 2021-08-10 DIAGNOSIS — S80811A Abrasion, right lower leg, initial encounter: Secondary | ICD-10-CM | POA: Diagnosis not present

## 2021-08-10 DIAGNOSIS — W5503XA Scratched by cat, initial encounter: Secondary | ICD-10-CM

## 2021-08-10 MED ORDER — SULFAMETHOXAZOLE-TRIMETHOPRIM 800-160 MG PO TABS: 1.0000 | ORAL_TABLET | Freq: Two times a day (BID) | ORAL | 0 refills | Status: AC

## 2021-08-10 NOTE — ED Triage Notes (Signed)
Cat scratched her two days ago. She's concerned for infection. Mild redness around site.

## 2021-08-10 NOTE — Discharge Instructions (Addendum)
Take antibiotic as prescribed. Follow up with any further concerns or worsening symptoms.

## 2021-08-10 NOTE — ED Provider Notes (Signed)
[facility]    CSN: [id number] Arrival date & time: 08/10/21  1851      History   Chief Complaint Chief Complaint  Patient presents with   Abrasion    HPI Kristen [name] is a 80 y.o. female.   Patient here today for evaluation of abrasion to her right lower leg that occurred when he cat accidentally scratched her trying to climb into her lap 2 days ago. She reports that initially she did have bleeding and then she cleaned area with alcohol. She was also using antibiotic ointment. She notes that after she forgot to change the dressing yesterday she noted some stinging to the area this morning and is concerned it may be getting infected as this is a sign she has appreciated with skin infection. She has not had fever. She denies any other symptoms.     Past Medical History:  Diagnosis Date   Abscess of abdominal cavity ([place]) 10/02/2019   Anticoagulant long-term use    elquis -- managed by cardiology   Anxiety    SITUATIONAL IN PAST   Atrial fibrillation ([place])    Chronic calculous cholecystitis    Chronic kidney disease    ckd stage 4 per lov dr Corliss Parish 09-28-2020 on chart   Chronic venous insufficiency    w/  varicose veins right worse than left waers compresiion stocking prn   Depression    SITUATIONAL IN PAST   Diastolic CHF, chronic ([place]) 2010   followed by cardiology HX OF 2010 DUE TO FLECANIDE   Diverticulosis of colon    Dyspnea    occ with heavt activity and at bedtime when tired for last few months per pt   Dysrhythmia    a-fib   Edema of both lower extremities    per pt mostly in summer time   Essential hypertension, benign    Fibrocystic breast disease    Full dentures    GERD (gastroesophageal reflux disease)    occasional tums and does not eat prior to bedtime   Gout    08-19-2019  per pt last episode DEC 2021 LEFT HAND   Hiatal hernia    History of diverticulitis 01/22/2016   History of recurrent UTIs    NONE RECNET SEES DR  WRENN   Hx of cholecystectomy 10/02/2019   Hyperlipidemia    Insomnia    Migraines    OA (osteoarthritis)    knees, lower back   Permanent atrial fibrillation [facility]) cardiologist--- dr Agustin Cree   first dx 10/ 2011--- histroy DCCV 11-25-2010 by dr Wynonia Lawman (pt's previous cardiologist) and Cardiac cath 12-17-2010 no significant disease   Pneumonia 08/2019   COLLAPSED LEFT LUNG AND PNEUMONIA   Scoliosis    Stress incontinence in female     Patient Active Problem List   Diagnosis Date Noted   Occipital neuralgia of right side 06/10/2021   History of lacunar cerebrovascular accident 06/10/2021   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/2021   Ganglion cyst of volar aspect of right wrist 12/07/2020   Chronic daily headache 07/03/2020   Elevated serum creatinine 05/04/2020   Muscle spasms of both lower extremities 05/04/2020   Seborrheic keratoses 04/30/2020   Skin tag 04/08/2020   Fatigue 03/17/2020   Unsatisfactory living conditions 10/23/2019   Hx of cholecystectomy 10/02/2019   Acute renal failure (ARF) ([place]) 08/29/2019   A-fib ([place]) 08/29/2019   Varicose veins of leg with edema, right 05/21/2019   Bunion of great toe of right foot 05/21/2019  Osteoarthritis 03/13/2018   Gait abnormality 03/13/2018   Flat wart 02/13/2018   Situational mixed anxiety and depressive disorder 10/27/2017   Abnormal Pap smear of cervix 02/08/2016   Chronic kidney disease 02/08/2016   Urinary frequency 02/08/2016   History of diverticulitis 01/22/2016   Anxiety state 10/17/2014   Long-term (current) use of anticoagulants 06/10/2014   Chronic venous insufficiency 06/10/2014   Obesity (BMI 30-39.9)    Acute on chronic diastolic CHF (congestive heart failure) (HCC)    Candidal intertrigo 06/09/2014   Insomnia 01/10/2011   Permanent atrial fibrillation ([place])    OSTEOPENIA 01/31/2008   Hypertensive heart disease     Past Surgical History:  Procedure Laterality Date   BILIARY STENT PLACEMENT  N/A 09/03/2019   Procedure: BILIARY STENT PLACEMENT;  Surgeon: Clarene Essex, MD;  Location: [facility];  Service: Endoscopy;  Laterality: N/A;   BLEPHAROPLASTY Bilateral 02-22-2010  dr Georgia Lopes   upper eyelid's   both knees cortisone injection  08/18/2020   dr Noemi Chapel   BUNIONECTOMY Right 2003   CATARACT EXTRACTION W/ INTRAOCULAR LENS  IMPLANT, BILATERAL  2018   CERVICAL CONIZATION W/BX N/A 11/05/2020   Procedure: CONIZATION CERVIX WITH BIOPSY;  Surgeon: Newton Pigg, MD;  Location: [facility];  Service: Gynecology;  Laterality: N/A;   CHOLECYSTECTOMY N/A 08/22/2019   Procedure: LAPAROSCOPIC CHOLECYSTECTOMY;  Surgeon: [name], [name], MD;  Location: [facility];  Service: General;  Laterality: N/A;   ERCP N/A 09/03/2019   Procedure: ENDOSCOPIC RETROGRADE CHOLANGIOPANCREATOGRAPHY (ERCP);  Surgeon: Clarene Essex, MD;  Location: [facility];  Service: Endoscopy;  Laterality: N/A;   ESOPHAGOGASTRODUODENOSCOPY (EGD) WITH PROPOFOL N/A 12/06/2019   Procedure: ESOPHAGOGASTRODUODENOSCOPY (EGD) WITH PROPOFOL;  Surgeon: Clarene Essex, MD;  Location: [facility];  Service: Endoscopy;  Laterality: N/A;  stent removal, ERCP Scope, needs Flouro   HYSTEROSCOPY WITH D & C N/A 11/05/2020   Procedure: DILATATION AND CURETTAGE /HYSTEROSCOPY WITH MYOSURE;  Surgeon: Newton Pigg, MD;  Location: [facility];  Service: Gynecology;  Laterality: N/A;   IR SINUS/FIST TUBE CHK-NON GI  09/10/2019   IR SINUS/FIST TUBE CHK-NON GI  09/24/2019   KNEE ARTHROSCOPY Left 2002   meniscus repair   REMOVAL OF STONES  09/03/2019   Procedure: REMOVAL OF STONES;  Surgeon: Clarene Essex, MD;  Location: [facility];  Service: Endoscopy;;   SPHINCTEROTOMY  09/03/2019   Procedure: Joan Mayans;  Surgeon: Clarene Essex, MD;  Location: [facility];  Service: Endoscopy;;   STENT REMOVAL  12/06/2019   Procedure: STENT REMOVAL;  Surgeon: Clarene Essex, MD;  Location: [facility];  Service:  Endoscopy;;   TUBAL LIGATION Bilateral 1980   AND RIGHT BREAST EXCISION CYST (BENIGN)    OB History     Gravida  2   Para      Term      Preterm      AB      Living         SAB      IAB      Ectopic      Multiple      Live Births               Home Medications    Prior to Admission medications   Medication Sig Start Date End Date Taking? Authorizing Provider  sulfamethoxazole-trimethoprim (BACTRIM DS) 800-160 MG tablet Take 1 tablet by mouth 2 (two) times daily for 7 days. 08/10/21 08/17/21 Yes Francene Finders, PA-C  acetaminophen (TYLENOL) 650 MG CR tablet Take  650-1,300 mg by mouth every 8 (eight) hours as needed for pain.    [provider]  alendronate (FOSAMAX) 10 MG tablet TAKE (1) TABLET BY MOUTH PER WEEK TAKE ON EMPTY STOMACH WITH FULL GLASS OF WATER 09/11/19   [name], MD  allopurinol (ZYLOPRIM) 100 MG tablet TAKE 1 TABLET BY MOUTH EVERY DAY 06/29/21   Persons, Bevely Palmer, PA  calcium carbonate (TUMS - DOSED IN MG ELEMENTAL CALCIUM) 500 MG chewable tablet Chew 1-2 tablets by mouth at bedtime.    [provider]  CALCIUM PO Take 1 tablet by mouth daily.    [provider]  Carboxymethylcellul-Glycerin (LUBRICATING EYE DROPS OP) Place 1 drop into both eyes daily as needed (dry eyes).    [provider]  cholecalciferol (VITAMIN D3) 25 MCG (1000 UT) tablet Take 1,000 Units by mouth daily.    [provider]  clindamycin (CLINDAGEL) 1 % gel APPLY TO AFFECTED AREA TWICE A DAY 04/13/18   [name], MD  colchicine 0.6 MG tablet Take 1 tablet (0.6 mg total) by mouth 2 (two) times daily. Take twice daily until pain resolves and then take as needed for gout flare. 06/15/18   [name], MD  [facility] ANTI-FUNGAL 2 % powder APPLY TO AFFECTED AREA TWICE A DAY AFTER SKIN RESOLVES FROM KETOCONAZOLE CREAM 10/17/14   [name], MD  diazepam (VALIUM) 5 MG tablet TAKE 1/2 TABLET (2.5MG  TOTAL) BY MOUTH AT BEDTIME  12/17/20   Paige, [place] J, DO  ELIQUIS 5 MG TABS tablet TAKE 1 TABLET BY MOUTH TWICE A DAY 01/14/21   Jerline Pain, MD  Flax Oil-Fish Oil-Borage Oil (FISH-FLAX-BORAGE) CAPS Take 1 capsule by mouth daily.    [provider]  furosemide (LASIX) 20 MG tablet TAKE 1 TABLET BY MOUTH EVERY OTHER DAY ALTERNATING WITH 2 TABLETS EVERY OTHER DAY 07/23/21   Jerline Pain, MD  Garlic 253 MG TABS Take by mouth. DAILY    [provider]  glucosamine-chondroitin 500-400 MG tablet Take 1 tablet by mouth daily.     [provider]  HYDROcodone-acetaminophen (NORCO/VICODIN) 5-325 MG tablet Take 1 tablet by mouth every 6 (six) hours as needed for moderate pain. 09/17/19   Sheikh, Omair Latif, DO  lisinopril (ZESTRIL) 20 MG tablet TAKE 1 TABLET BY MOUTH EVERYDAY AT BEDTIME 06/30/21   Paige, [place] J, DO  loratadine (CLARITIN) 10 MG tablet TAKE 1 TABLET BY MOUTH EVERY DAY 03/15/21   Shary Key, DO  metoprolol succinate (TOPROL-XL) 100 MG 24 hr tablet TAKE 1 TABLET BY MOUTH DAILY. TAKE WITH OR IMMEDIATELY FOLLOWING A MEAL. 06/29/21   Jerline Pain, MD  Multiple Vitamins-Minerals (EMERGEN-C IMMUNE) PACK Take 1 packet by mouth daily.    [provider]  Multiple Vitamins-Minerals (MULTIVITAMIN WOMEN 50+) TABS Take 1 tablet by mouth daily.     [provider]  mupirocin ointment (BACTROBAN) 2 % APPLY TOPICALLY TO AFFECTED AREA TWICE DAILY 02/20/19   [name], MD  Nutritional Supplements (ECHINACEA/GOLDEN SEAL PO) Take 1 tablet by mouth daily.     [provider]  nystatin (MYCOSTATIN/NYSTOP) powder Apply 1 application topically 2 (two) times daily. 04/30/20   Kinnie Feil, MD  pantoprazole (PROTONIX) 40 MG tablet Take 1 tablet (40 mg total) by mouth daily. 09/18/19   Sheikh, Omair Latif, DO  polycarbophil (FIBERCON) 625 MG tablet Take 625 mg by mouth daily.    [provider]  potassium chloride (KLOR-CON) 20 MEQ packet USE 1  PACKET BY MOUTH  EVERY DAY 08/11/20   Shary Key, DO  Probiotic Product (PROBIOTIC-10 PO) Take 1 tablet by mouth daily.     [provider]  Propylene Glycol (SYSTANE COMPLETE) 0.6 % SOLN Place 1 drop into both eyes 4 (four) times daily as needed.    [provider]  RESTASIS 0.05 % ophthalmic emulsion INSTILL 1 DROP INTO BOTH EYES TWICE A DAY 06/01/20   Paige, Victoria J, DO  rosuvastatin (CRESTOR) 5 MG tablet Take 5 mg by mouth at bedtime. 03/29/21   [provider]  triamcinolone cream (KENALOG) 0.1 % APPLY TO AFFECTED AREA TWICE A DAY 06/30/21   Paige, Victoria J, DO  Turmeric (QC TUMERIC COMPLEX PO) Take by mouth. WITH CONDIN DAILY    [provider]    Family History Family History  Problem Relation Age of Onset   Cancer Mother        ovarian   Kidney disease Father    Alcohol abuse Father    Cancer Maternal Aunt        lung- smoker   Heart disease Maternal Uncle     Social History Social History   Tobacco Use   Smoking status: Never   Smokeless tobacco: Never  Vaping Use   Vaping Use: Never used  Substance Use Topics   Alcohol use: Not Currently   Drug use: Never     Allergies   Flecainide acetate   Review of Systems Review of Systems  Constitutional:  Negative for chills and fever.  Eyes:  Negative for discharge and redness.  Skin:  Positive for wound. Negative for color change.    Physical Exam Triage Vital Signs ED Triage Vitals [08/10/21 1926]  Enc Vitals Group     BP 125/87     Pulse Rate 64     Resp 16     Temp 97.8 F (36.6 C)     Temp Source Oral     SpO2 95 %     Weight      Height      Head Circumference      Peak Flow      Pain Score 5     Pain Loc      Pain Edu?      Excl. in [place]?    No data found.  Updated Vital Signs BP 125/87 (BP Location: Left Arm)   Pulse 64   Temp 97.8 F (36.6 C) (Oral)   Resp 16   SpO2 95%   Physical Exam Vitals and nursing note reviewed.  Constitutional:      General: She  is not in acute distress.    Appearance: Normal appearance. She is not ill-appearing.  HENT:     Head: Normocephalic and atraumatic.  Eyes:     Conjunctiva/sclera: Conjunctivae normal.  Cardiovascular:     Rate and Rhythm: Normal rate.  Pulmonary:     Effort: Pulmonary effort is normal.  Skin:    Comments: 3 separate superficial abrasions to right lower medial leg with no active bleeding, mild surrounding erythema.   Neurological:     Mental Status: She is alert.  Psychiatric:        Mood and Affect: Mood normal.        Behavior: Behavior normal.     UC Treatments / Results  Labs (all labs ordered are listed, but only abnormal results are displayed) Labs Reviewed - No data to display  EKG   Radiology No results found.  Procedures Procedures (including critical care time)  Medications Ordered in UC Medications - No data to display  Initial Impression / Assessment and Plan / UC Course  I have reviewed the triage vital signs and the nursing notes.  Pertinent labs & imaging results that were available during my care of the patient were reviewed by me and considered in my medical decision making (see chart for details).   Will treat to cover impending wound infection given known cat scratch and other co-morbidities. Recommended follow up with no improvement or if she has any further concerns.   Final Clinical Impressions(s) / UC Diagnoses   Final diagnoses:  Cat scratch     Discharge Instructions      Take antibiotic as prescribed. Follow up with any further concerns or worsening symptoms.      ED Prescriptions     Medication Sig Dispense Auth. Provider   sulfamethoxazole-trimethoprim (BACTRIM DS) 800-160 MG tablet Take 1 tablet by mouth 2 (two) times daily for 7 days. 14 tablet Francene Finders, PA-C      PDMP not reviewed this encounter.   Francene Finders, PA-C 08/10/21 1944

## 2021-09-02 ENCOUNTER — Encounter: Payer: Self-pay | Admitting: Emergency Medicine

## 2021-09-02 ENCOUNTER — Ambulatory Visit
Admission: EM | Admit: 2021-09-02 | Discharge: 2021-09-02 | Disposition: A | Discharge status: Home / Self Care | Payer: Medicare Other

## 2021-09-02 ENCOUNTER — Other Ambulatory Visit: Payer: Self-pay

## 2021-09-02 DIAGNOSIS — J069 Acute upper respiratory infection, unspecified: Secondary | ICD-10-CM | POA: Diagnosis not present

## 2021-09-02 DIAGNOSIS — W19XXXA Unspecified fall, initial encounter: Secondary | ICD-10-CM | POA: Diagnosis not present

## 2021-09-02 DIAGNOSIS — S0990XA Unspecified injury of head, initial encounter: Secondary | ICD-10-CM | POA: Diagnosis not present

## 2021-09-02 NOTE — ED Triage Notes (Signed)
Cough, nasal congestion, left ear itching, lack of appetite x 1 week.

## 2021-09-02 NOTE — Discharge Instructions (Signed)
Please go to the emergency department as soon as you leave urgent care for further evaluation and management. ?

## 2021-09-02 NOTE — ED Provider Notes (Signed)
[facility]    CSN: [id number] Arrival date & time: 09/02/21  1857      History   Chief Complaint Chief Complaint  Patient presents with   Cough   Otalgia    HPI Kristen [name] is a 80 y.o. female.   Patient presents with cough, nasal congestion, left ear discomfort that has been present for approximately 1 week.  Cough is productive.  Denies any known fevers or sick contacts.  Patient has been taking over-the-counter cough and cold medications with no improvement in symptoms.  Denies chest pain or shortness of breath.  Denies any chronic lung diseases.  Denies nausea, vomiting, diarrhea, abdominal pain.  Patient also reports that the side of her head "feels warm".  Patient reports that she fell yesterday and hit her head.  Patient does take Eliquis.  Denies any dizziness, blurred vision, nausea, vomiting, headache.   Cough Otalgia  Past Medical History:  Diagnosis Date   Abscess of abdominal cavity ([place]) 10/02/2019   Anticoagulant long-term use    elquis -- managed by cardiology   Anxiety    SITUATIONAL IN PAST   Atrial fibrillation ([place])    Chronic calculous cholecystitis    Chronic kidney disease    ckd stage 4 per lov dr Corliss Parish 09-28-2020 on chart   Chronic venous insufficiency    w/  varicose veins right worse than left waers compresiion stocking prn   Depression    SITUATIONAL IN PAST   Diastolic CHF, chronic ([place]) 2010   followed by cardiology HX OF 2010 DUE TO FLECANIDE   Diverticulosis of colon    Dyspnea    occ with heavt activity and at bedtime when tired for last few months per pt   Dysrhythmia    a-fib   Edema of both lower extremities    per pt mostly in summer time   Essential hypertension, benign    Fibrocystic breast disease    Full dentures    GERD (gastroesophageal reflux disease)    occasional tums and does not eat prior to bedtime   Gout    08-19-2019  per pt last episode DEC 2021 LEFT HAND   Hiatal hernia     History of diverticulitis 01/22/2016   History of recurrent UTIs    NONE RECNET SEES DR WRENN   Hx of cholecystectomy 10/02/2019   Hyperlipidemia    Insomnia    Migraines    OA (osteoarthritis)    knees, lower back   Permanent atrial fibrillation [facility]) cardiologist--- dr Agustin Cree   first dx 10/ 2011--- histroy DCCV 11-25-2010 by dr Wynonia Lawman (pt's previous cardiologist) and Cardiac cath 12-17-2010 no significant disease   Pneumonia 08/2019   COLLAPSED LEFT LUNG AND PNEUMONIA   Scoliosis    Stress incontinence in female     Patient Active Problem List   Diagnosis Date Noted   Occipital neuralgia of right side 06/10/2021   History of lacunar cerebrovascular accident 06/10/2021   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/2021   Ganglion cyst of volar aspect of right wrist 12/07/2020   Chronic daily headache 07/03/2020   Elevated serum creatinine 05/04/2020   Muscle spasms of both lower extremities 05/04/2020   Seborrheic keratoses 04/30/2020   Skin tag 04/08/2020   Fatigue 03/17/2020   Unsatisfactory living conditions 10/23/2019   Hx of cholecystectomy 10/02/2019   Acute renal failure (ARF) ([place]) 08/29/2019   A-fib ([place]) 08/29/2019   Varicose veins of leg with edema, right 05/21/2019   Bunion of  great toe of right foot 05/21/2019   Osteoarthritis 03/13/2018   Gait abnormality 03/13/2018   Flat wart 02/13/2018   Situational mixed anxiety and depressive disorder 10/27/2017   Abnormal Pap smear of cervix 02/08/2016   Chronic kidney disease 02/08/2016   Urinary frequency 02/08/2016   History of diverticulitis 01/22/2016   Anxiety state 10/17/2014   Long-term (current) use of anticoagulants 06/10/2014   Chronic venous insufficiency 06/10/2014   Obesity (BMI 30-39.9)    Acute on chronic diastolic CHF (congestive heart failure) (HCC)    Candidal intertrigo 06/09/2014   Insomnia 01/10/2011   Permanent atrial fibrillation ([place])    OSTEOPENIA 01/31/2008   Hypertensive heart  disease     Past Surgical History:  Procedure Laterality Date   BILIARY STENT PLACEMENT N/A 09/03/2019   Procedure: BILIARY STENT PLACEMENT;  Surgeon: Clarene Essex, MD;  Location: [facility];  Service: Endoscopy;  Laterality: N/A;   BLEPHAROPLASTY Bilateral 02-22-2010  dr Georgia Lopes   upper eyelid's   both knees cortisone injection  08/18/2020   dr Noemi Chapel   BUNIONECTOMY Right 2003   CATARACT EXTRACTION W/ INTRAOCULAR LENS  IMPLANT, BILATERAL  2018   CERVICAL CONIZATION W/BX N/A 11/05/2020   Procedure: CONIZATION CERVIX WITH BIOPSY;  Surgeon: Newton Pigg, MD;  Location: [place] [facility];  Service: Gynecology;  Laterality: N/A;   CHOLECYSTECTOMY N/A 08/22/2019   Procedure: LAPAROSCOPIC CHOLECYSTECTOMY;  Surgeon: [name], [name], MD;  Location[facility];  Service: General;  Laterality: N/A;   ERCP N/A 09/03/2019   Procedure: ENDOSCOPIC RETROGRADE CHOLANGIOPANCREATOGRAPHY (ERCP);  Surgeon: Clarene Essex, MD;  Location: [facility];  Service: Endoscopy;  Laterality: N/A;   ESOPHAGOGASTRODUODENOSCOPY (EGD) WITH PROPOFOL N/A 12/06/2019   Procedure: ESOPHAGOGASTRODUODENOSCOPY (EGD) WITH PROPOFOL;  Surgeon: Clarene Essex, MD;  Location: [facility];  Service: Endoscopy;  Laterality: N/A;  stent removal, ERCP Scope, needs Flouro   HYSTEROSCOPY WITH D & C N/A 11/05/2020   Procedure: DILATATION AND CURETTAGE /HYSTEROSCOPY WITH MYOSURE;  Surgeon: Newton Pigg, MD;  Location: [place];  Service: Gynecology;  Laterality: N/A;   IR SINUS/FIST TUBE CHK-NON GI  09/10/2019   IR SINUS/FIST TUBE CHK-NON GI  09/24/2019   KNEE ARTHROSCOPY Left 2002   meniscus repair   REMOVAL OF STONES  09/03/2019   Procedure: REMOVAL OF STONES;  Surgeon: Clarene Essex, MD;  Location: [facility];  Service: Endoscopy;;   SPHINCTEROTOMY  09/03/2019   Procedure: Joan Mayans;  Surgeon: Clarene Essex, MD;  Location: [facility];  Service: Endoscopy;;   STENT REMOVAL  12/06/2019    Procedure: STENT REMOVAL;  Surgeon: Clarene Essex, MD;  Location: [facility];  Service: Endoscopy;;   TUBAL LIGATION Bilateral 1980   AND RIGHT BREAST EXCISION CYST (BENIGN)    OB History     Gravida  2   Para      Term      Preterm      AB      Living         SAB      IAB      Ectopic      Multiple      Live Births               Home Medications    Prior to Admission medications   Medication Sig Start Date End Date Taking? Authorizing Provider  acetaminophen (TYLENOL) 650 MG CR tablet Take 650-1,300 mg by mouth every 8 (eight) hours as needed for pain.    [provider]  alendronate (FOSAMAX) 10 MG tablet TAKE (1) TABLET BY MOUTH PER WEEK TAKE ON EMPTY STOMACH WITH FULL GLASS OF WATER 09/11/19   [name], MD  allopurinol (ZYLOPRIM) 100 MG tablet TAKE 1 TABLET BY MOUTH EVERY DAY 06/29/21   Persons, Bevely Palmer, PA  calcium carbonate (TUMS - DOSED IN MG ELEMENTAL CALCIUM) 500 MG chewable tablet Chew 1-2 tablets by mouth at bedtime.    [provider]  CALCIUM PO Take 1 tablet by mouth daily.    [provider]  Carboxymethylcellul-Glycerin (LUBRICATING EYE DROPS OP) Place 1 drop into both eyes daily as needed (dry eyes).    [provider]  cholecalciferol (VITAMIN D3) 25 MCG (1000 UT) tablet Take 1,000 Units by mouth daily.    [provider]  clindamycin (CLINDAGEL) 1 % gel APPLY TO AFFECTED AREA TWICE A DAY 04/13/18   [name], MD  colchicine 0.6 MG tablet Take 1 tablet (0.6 mg total) by mouth 2 (two) times daily. Take twice daily until pain resolves and then take as needed for gout flare. 06/15/18   [name], MD  [facility] ANTI-FUNGAL 2 % powder APPLY TO AFFECTED AREA TWICE A DAY AFTER SKIN RESOLVES FROM KETOCONAZOLE CREAM 10/17/14   [name], MD  diazepam (VALIUM) 5 MG tablet TAKE 1/2 TABLET (2.5MG  TOTAL) BY MOUTH AT BEDTIME 12/17/20   Paige, [place] J, DO  ELIQUIS 5 MG TABS tablet TAKE 1 TABLET BY  MOUTH TWICE A DAY 01/14/21   Jerline Pain, MD  Flax Oil-Fish Oil-Borage Oil (FISH-FLAX-BORAGE) CAPS Take 1 capsule by mouth daily.    [provider]  furosemide (LASIX) 20 MG tablet TAKE 1 TABLET BY MOUTH EVERY OTHER DAY ALTERNATING WITH 2 TABLETS EVERY OTHER DAY 07/23/21   Jerline Pain, MD  Garlic 161 MG TABS Take by mouth. DAILY    [provider]  glucosamine-chondroitin 500-400 MG tablet Take 1 tablet by mouth daily.     [provider]  HYDROcodone-acetaminophen (NORCO/VICODIN) 5-325 MG tablet Take 1 tablet by mouth every 6 (six) hours as needed for moderate pain. 09/17/19   Sheikh, Omair Latif, DO  lisinopril (ZESTRIL) 20 MG tablet TAKE 1 TABLET BY MOUTH EVERYDAY AT BEDTIME 06/30/21   Paige, [place] J, DO  loratadine (CLARITIN) 10 MG tablet TAKE 1 TABLET BY MOUTH EVERY DAY 03/15/21   Shary Key, DO  metoprolol succinate (TOPROL-XL) 100 MG 24 hr tablet TAKE 1 TABLET BY MOUTH DAILY. TAKE WITH OR IMMEDIATELY FOLLOWING A MEAL. 06/29/21   Jerline Pain, MD  Multiple Vitamins-Minerals (EMERGEN-C IMMUNE) PACK Take 1 packet by mouth daily.    [provider]  Multiple Vitamins-Minerals (MULTIVITAMIN WOMEN 50+) TABS Take 1 tablet by mouth daily.     [provider]  mupirocin ointment (BACTROBAN) 2 % APPLY TOPICALLY TO AFFECTED AREA TWICE DAILY 02/20/19   [name], MD  Nutritional Supplements (ECHINACEA/GOLDEN SEAL PO) Take 1 tablet by mouth daily.     [provider]  nystatin (MYCOSTATIN/NYSTOP) powder Apply 1 application topically 2 (two) times daily. 04/30/20   Kinnie Feil, MD  pantoprazole (PROTONIX) 40 MG tablet Take 1 tablet (40 mg total) by mouth daily. 09/18/19   Sheikh, Omair Latif, DO  polycarbophil (FIBERCON) 625 MG tablet Take 625 mg by mouth daily.    [provider]  potassium chloride (KLOR-CON) 20 MEQ packet USE 1 PACKET BY MOUTH EVERY DAY 08/11/20   Shary Key, DO  Probiotic Product (PROBIOTIC-10  PO) Take  1 tablet by mouth daily.     [provider]  Propylene Glycol (SYSTANE COMPLETE) 0.6 % SOLN Place 1 drop into both eyes 4 (four) times daily as needed.    [provider]  RESTASIS 0.05 % ophthalmic emulsion INSTILL 1 DROP INTO BOTH EYES TWICE A DAY 06/01/20   Paige, Victoria J, DO  rosuvastatin (CRESTOR) 5 MG tablet Take 5 mg by mouth at bedtime. 03/29/21   [provider]  triamcinolone cream (KENALOG) 0.1 % APPLY TO AFFECTED AREA TWICE A DAY 06/30/21   Paige, Victoria J, DO  Turmeric (QC TUMERIC COMPLEX PO) Take by mouth. WITH CONDIN DAILY    [provider]    Family History Family History  Problem Relation Age of Onset   Cancer Mother        ovarian   Kidney disease Father    Alcohol abuse Father    Cancer Maternal Aunt        lung- smoker   Heart disease Maternal Uncle     Social History Social History   Tobacco Use   Smoking status: Never   Smokeless tobacco: Never  Vaping Use   Vaping Use: Never used  Substance Use Topics   Alcohol use: Not Currently   Drug use: Never     Allergies   Flecainide acetate   Review of Systems Review of Systems Per HPI  Physical Exam Triage Vital Signs ED Triage Vitals [09/02/21 1939]  Enc Vitals Group     BP 126/82     Pulse Rate 90     Resp 16     Temp 98.4 F (36.9 C)     Temp Source Oral     SpO2 92 %     Weight      Height      Head Circumference      Peak Flow      Pain Score      Pain Loc      Pain Edu?      Excl. in [place]?    No data found.  Updated Vital Signs BP 126/82 (BP Location: Left Arm)   Pulse 90   Temp 98.4 F (36.9 C) (Oral)   Resp 16   SpO2 92%   Visual Acuity Right Eye Distance:   Left Eye Distance:   Bilateral Distance:    Right Eye Near:   Left Eye Near:    Bilateral Near:     Physical Exam Constitutional:      General: She is not in acute distress.    Appearance: Normal appearance. She is not toxic-appearing or diaphoretic.  HENT:      Head: Normocephalic and atraumatic.     Right Ear: Tympanic membrane and ear canal normal.     Left Ear: Tympanic membrane and ear canal normal.     Nose: Congestion present.     Mouth/Throat:     Mouth: Mucous membranes are moist.     Pharynx: No posterior oropharyngeal erythema.  Eyes:     Extraocular Movements: Extraocular movements intact.     Conjunctiva/sclera: Conjunctivae normal.     Pupils: Pupils are equal, round, and reactive to light.  Cardiovascular:     Rate and Rhythm: Normal rate and regular rhythm.     Pulses: Normal pulses.     Heart sounds: Normal heart sounds.  Pulmonary:     Effort: Pulmonary effort is normal. No respiratory distress.     Breath sounds: Normal breath sounds. No wheezing.  Abdominal:     General: Abdomen is flat. Bowel sounds are normal.     Palpations: Abdomen is soft.  Musculoskeletal:        General: Normal range of motion.     Cervical back: Normal range of motion.  Skin:    General: Skin is warm and dry.  Neurological:     General: No focal deficit present.     Mental Status: She is alert and oriented to person, place, and time. Mental status is at baseline.     Cranial Nerves: Cranial nerves 2-12 are intact.     Sensory: Sensation is intact.     Motor: Motor function is intact.     Coordination: Coordination is intact.     Gait: Gait is intact.  Psychiatric:        Mood and Affect: Mood normal.        Behavior: Behavior normal.     UC Treatments / Results  Labs (all labs ordered are listed, but only abnormal results are displayed) Labs Reviewed - No data to display  EKG   Radiology No results found.  Procedures Procedures (including critical care time)  Medications Ordered in UC Medications - No data to display  Initial Impression / Assessment and Plan / UC Course  I have reviewed the triage vital signs and the nursing notes.  Pertinent labs & imaging results that were available during my care of the patient were  reviewed by me and considered in my medical decision making (see chart for details).     Patient had head injury and is currently taking Eliquis, therefore patient was advised that she will need to be evaluated at the hospital.  Patient's oxygen saturation also ranging 91 to 92% during physical exam in triage and patient has had cough that has been present for 1 week.  Patient will need to be further evaluated at the hospital due to this low oxygen saturation as well.  Patient was agreeable with plan.  Vital signs fairly stable at discharge and oxygen had increased to 94%.  Neuro exam is normal.  Agree with patient self transport to the hospital. Final Clinical Impressions(s) / UC Diagnoses   Final diagnoses:  Fall, initial encounter  Injury of head, initial encounter  Viral upper respiratory tract infection with cough     Discharge Instructions      Please go to the emergency department as soon as you leave urgent care for further evaluation and management.     ED Prescriptions   None    PDMP not reviewed this encounter.   Teodora Medici, Rule 09/02/21 2049

## 2021-09-03 ENCOUNTER — Emergency Department (HOSPITAL_BASED_OUTPATIENT_CLINIC_OR_DEPARTMENT_OTHER): Payer: Medicare Other

## 2021-09-03 ENCOUNTER — Encounter (HOSPITAL_BASED_OUTPATIENT_CLINIC_OR_DEPARTMENT_OTHER): Payer: Self-pay

## 2021-09-03 ENCOUNTER — Other Ambulatory Visit: Payer: Self-pay

## 2021-09-03 ENCOUNTER — Emergency Department (HOSPITAL_BASED_OUTPATIENT_CLINIC_OR_DEPARTMENT_OTHER)
Admission: EM | Admit: 2021-09-03 | Discharge: 2021-09-03 | Disposition: A | Discharge status: Home / Self Care | Payer: Medicare Other | Attending: Emergency Medicine | Admitting: Emergency Medicine

## 2021-09-03 ENCOUNTER — Other Ambulatory Visit: Payer: Self-pay | Admitting: Family Medicine

## 2021-09-03 ENCOUNTER — Other Ambulatory Visit: Payer: Self-pay | Admitting: Cardiology

## 2021-09-03 DIAGNOSIS — N184 Chronic kidney disease, stage 4 (severe): Secondary | ICD-10-CM | POA: Insufficient documentation

## 2021-09-03 DIAGNOSIS — I5032 Chronic diastolic (congestive) heart failure: Secondary | ICD-10-CM | POA: Diagnosis not present

## 2021-09-03 DIAGNOSIS — S8001XA Contusion of right knee, initial encounter: Secondary | ICD-10-CM | POA: Diagnosis not present

## 2021-09-03 DIAGNOSIS — S0083XA Contusion of other part of head, initial encounter: Secondary | ICD-10-CM | POA: Insufficient documentation

## 2021-09-03 DIAGNOSIS — J0140 Acute pansinusitis, unspecified: Secondary | ICD-10-CM | POA: Diagnosis not present

## 2021-09-03 DIAGNOSIS — W01198A Fall on same level from slipping, tripping and stumbling with subsequent striking against other object, initial encounter: Secondary | ICD-10-CM | POA: Insufficient documentation

## 2021-09-03 DIAGNOSIS — I130 Hypertensive heart and chronic kidney disease with heart failure and stage 1 through stage 4 chronic kidney disease, or unspecified chronic kidney disease: Secondary | ICD-10-CM | POA: Insufficient documentation

## 2021-09-03 DIAGNOSIS — S8991XA Unspecified injury of right lower leg, initial encounter: Secondary | ICD-10-CM | POA: Diagnosis present

## 2021-09-03 DIAGNOSIS — Z79899 Other long term (current) drug therapy: Secondary | ICD-10-CM | POA: Diagnosis not present

## 2021-09-03 DIAGNOSIS — Z7901 Long term (current) use of anticoagulants: Secondary | ICD-10-CM | POA: Diagnosis not present

## 2021-09-03 DIAGNOSIS — Z20822 Contact with and (suspected) exposure to covid-19: Secondary | ICD-10-CM | POA: Insufficient documentation

## 2021-09-03 DIAGNOSIS — J189 Pneumonia, unspecified organism: Secondary | ICD-10-CM

## 2021-09-03 DIAGNOSIS — I4821 Permanent atrial fibrillation: Secondary | ICD-10-CM | POA: Insufficient documentation

## 2021-09-03 DIAGNOSIS — J181 Lobar pneumonia, unspecified organism: Secondary | ICD-10-CM | POA: Insufficient documentation

## 2021-09-03 LAB — RESP PANEL BY RT-PCR (FLU A&B, COVID) ARPGX2
Influenza A by PCR: NEGATIVE
Influenza B by PCR: NEGATIVE
SARS Coronavirus 2 by RT PCR: NEGATIVE

## 2021-09-03 MED ORDER — HYDROCOD POLST-CPM POLST ER 10-8 MG/5ML PO SUER: 5.0000 mL | Freq: Two times a day (BID) | ORAL | 0 refills | Status: DC | PRN

## 2021-09-03 MED ORDER — OXYMETAZOLINE HCL 0.05 % NA SOLN
1.0000 | Freq: Once | NASAL | Status: AC
  Administered 2021-09-03: 1 via NASAL
  Filled 2021-09-03: qty 30

## 2021-09-03 MED ORDER — FLUTICASONE PROPIONATE 50 MCG/ACT NA SUSP: 1.0000 | Freq: Every day | NASAL | 2 refills | Status: AC

## 2021-09-03 MED ORDER — AMOXICILLIN-POT CLAVULANATE 875-125 MG PO TABS: 1.0000 | ORAL_TABLET | Freq: Two times a day (BID) | ORAL | 0 refills | Status: DC

## 2021-09-03 MED ORDER — AMOXICILLIN-POT CLAVULANATE 875-125 MG PO TABS
1.0000 | ORAL_TABLET | Freq: Once | ORAL | Status: AC
  Administered 2021-09-03: 1 via ORAL
  Filled 2021-09-03: qty 1

## 2021-09-03 NOTE — ED Notes (Signed)
Pt ambulating with Megan - RT

## 2021-09-03 NOTE — ED Triage Notes (Addendum)
Pt presents to ED d/t fall Wednesday night, hitting her head, sent here from [facility] for further evaluation d/t pt taking blood thinners. UC was concerned for a "brain bleed" and to r/o PNA d/t "lingering ongoing cough" pt taking OTC Robitussin   Pt reports she has not taken her Eliquis since 10/27 am dose. Also ran out of her metoprolol x2 days. Picked up her refill on the way here   Pt also c/o Right knee pain d/t mechanical fall 2 nights ago

## 2021-09-03 NOTE — ED Provider Notes (Addendum)
[facility] EMERGENCY DEPT Provider Note   CSN: [id number] Arrival date & time: 09/03/21  1258     History Chief Complaint  Patient presents with   Mark is a 80 y.o. female.  Pt presents to the ED today with a fall and cough.  Pt said she has had a cough for several days.  She tripped on a curb and hit her forehead on 10/26.  She is on Eliquis for a hx of afib.  She has not taken her Eliquis since 10/27 because she was worried about a brain bleed.  Pt did go to UC yesterday.  They told her to go to the ED to get a CT of her head due to trauma.  She was also recommended to get a CXR due to possible pna.  Pt said she tried to find this ED yesterday evening, but could not find it.  She was able to find it today.  When she fell, she hit her right knee.  She has been able to ambulate.  For her cough, she has been taking otc robitussin dm which has not been helping.      Past Medical History:  Diagnosis Date   Abscess of abdominal cavity ([place]) 10/02/2019   Anticoagulant long-term use    elquis -- managed by cardiology   Anxiety    SITUATIONAL IN PAST   Atrial fibrillation ([place])    Chronic calculous cholecystitis    Chronic kidney disease    ckd stage 4 per lov dr Corliss Parish 09-28-2020 on chart   Chronic venous insufficiency    w/  varicose veins right worse than left waers compresiion stocking prn   Depression    SITUATIONAL IN PAST   Diastolic CHF, chronic ([place]) 2010   followed by cardiology HX OF 2010 DUE TO FLECANIDE   Diverticulosis of colon    Dyspnea    occ with heavt activity and at bedtime when tired for last few months per pt   Dysrhythmia    a-fib   Edema of both lower extremities    per pt mostly in summer time   Essential hypertension, benign    Fibrocystic breast disease    Full dentures    GERD (gastroesophageal reflux disease)    occasional tums and does not eat prior to bedtime   Gout    08-19-2019  per pt last  episode DEC 2021 LEFT HAND   Hiatal hernia    History of diverticulitis 01/22/2016   History of recurrent UTIs    NONE RECNET SEES DR WRENN   Hx of cholecystectomy 10/02/2019   Hyperlipidemia    Insomnia    Migraines    OA (osteoarthritis)    knees, lower back   Permanent atrial fibrillation [facility]) cardiologist--- dr Agustin Cree   first dx 10/ 2011--- histroy DCCV 11-25-2010 by dr Wynonia Lawman (pt's previous cardiologist) and Cardiac cath 12-17-2010 no significant disease   Pneumonia 08/2019   COLLAPSED LEFT LUNG AND PNEUMONIA   Scoliosis    Stress incontinence in female     Patient Active Problem List   Diagnosis Date Noted   Occipital neuralgia of right side 06/10/2021   History of lacunar cerebrovascular accident 06/10/2021   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/2021   Ganglion cyst of volar aspect of right wrist 12/07/2020   Chronic daily headache 07/03/2020   Elevated serum creatinine 05/04/2020   Muscle spasms of both lower extremities 05/04/2020   Seborrheic keratoses 04/30/2020  Skin tag 04/08/2020   Fatigue 03/17/2020   Unsatisfactory living conditions 10/23/2019   Hx of cholecystectomy 10/02/2019   Acute renal failure (ARF) ([place]) 08/29/2019   A-fib ([place]) 08/29/2019   Varicose veins of leg with edema, right 05/21/2019   Bunion of great toe of right foot 05/21/2019   Osteoarthritis 03/13/2018   Gait abnormality 03/13/2018   Flat wart 02/13/2018   Situational mixed anxiety and depressive disorder 10/27/2017   Abnormal Pap smear of cervix 02/08/2016   Chronic kidney disease 02/08/2016   Urinary frequency 02/08/2016   History of diverticulitis 01/22/2016   Anxiety state 10/17/2014   Long-term (current) use of anticoagulants 06/10/2014   Chronic venous insufficiency 06/10/2014   Obesity (BMI 30-39.9)    Acute on chronic diastolic CHF (congestive heart failure) (HCC)    Candidal intertrigo 06/09/2014   Insomnia 01/10/2011   Permanent atrial fibrillation ([place])     OSTEOPENIA 01/31/2008   Hypertensive heart disease     Past Surgical History:  Procedure Laterality Date   BILIARY STENT PLACEMENT N/A 09/03/2019   Procedure: BILIARY STENT PLACEMENT;  Surgeon: Clarene Essex, MD;  Location: [facility];  Service: Endoscopy;  Laterality: N/A;   BLEPHAROPLASTY Bilateral 02-22-2010  dr Georgia Lopes   upper eyelid's   both knees cortisone injection  08/18/2020   dr Noemi Chapel   BUNIONECTOMY Right 2003   CATARACT EXTRACTION W/ INTRAOCULAR LENS  IMPLANT, BILATERAL  2018   CERVICAL CONIZATION W/BX N/A 11/05/2020   Procedure: CONIZATION CERVIX WITH BIOPSY;  Surgeon: Newton Pigg, MD;  Location: [facility];  Service: Gynecology;  Laterality: N/A;   CHOLECYSTECTOMY N/A 08/22/2019   Procedure: LAPAROSCOPIC CHOLECYSTECTOMY;  Surgeon: [name], [name], MD;  Location[facility];  Service: General;  Laterality: N/A;   ERCP N/A 09/03/2019   Procedure: ENDOSCOPIC RETROGRADE CHOLANGIOPANCREATOGRAPHY (ERCP);  Surgeon: Clarene Essex, MD;  Location: [facility];  Service: Endoscopy;  Laterality: N/A;   ESOPHAGOGASTRODUODENOSCOPY (EGD) WITH PROPOFOL N/A 12/06/2019   Procedure: ESOPHAGOGASTRODUODENOSCOPY (EGD) WITH PROPOFOL;  Surgeon: Clarene Essex, MD;  Location: [facility];  Service: Endoscopy;  Laterality: N/A;  stent removal, ERCP Scope, needs Flouro   HYSTEROSCOPY WITH D & C N/A 11/05/2020   Procedure: DILATATION AND CURETTAGE /HYSTEROSCOPY WITH MYOSURE;  Surgeon: Newton Pigg, MD;  Location: [facility];  Service: Gynecology;  Laterality: N/A;   IR SINUS/FIST TUBE CHK-NON GI  09/10/2019   IR SINUS/FIST TUBE CHK-NON GI  09/24/2019   KNEE ARTHROSCOPY Left 2002   meniscus repair   REMOVAL OF STONES  09/03/2019   Procedure: REMOVAL OF STONES;  Surgeon: Clarene Essex, MD;  [facility]: [facility];  Service: Endoscopy;;   SPHINCTEROTOMY  09/03/2019   Procedure: Joan Mayans;  Surgeon: Clarene Essex, MD;  [facility]: [facility];   Service: Endoscopy;;   STENT REMOVAL  12/06/2019   Procedure: STENT REMOVAL;  Surgeon: Clarene Essex, MD;  Location: [facility];  Service: Endoscopy;;   TUBAL LIGATION Bilateral 1980   AND RIGHT BREAST EXCISION CYST (BENIGN)     OB History     Gravida  2   Para      Term      Preterm      AB      Living         SAB      IAB      Ectopic      Multiple      Live Births  Family History  Problem Relation Age of Onset   Cancer Mother        ovarian   Kidney disease Father    Alcohol abuse Father    Cancer Maternal Aunt        lung- smoker   Heart disease Maternal Uncle     Social History   Tobacco Use   Smoking status: Never   Smokeless tobacco: Never  Vaping Use   Vaping Use: Never used  Substance Use Topics   Alcohol use: Not Currently   Drug use: Never    Home Medications Prior to Admission medications   Medication Sig Start Date End Date Taking? Authorizing Provider  amoxicillin-clavulanate (AUGMENTIN) 875-125 MG tablet Take 1 tablet by mouth every 12 (twelve) hours. 09/03/21  Yes Isla Pence, MD  chlorpheniramine-HYDROcodone [facility] PENNKINETIC ER) 10-8 MG/5ML SUER Take 5 mLs by mouth every 12 (twelve) hours as needed for cough. 09/03/21  Yes Isla Pence, MD  fluticasone (FLONASE) 50 MCG/ACT nasal spray Place 1 spray into both nostrils daily. 09/03/21  Yes Isla Pence, MD  acetaminophen (TYLENOL) 650 MG CR tablet Take 650-1,300 mg by mouth every 8 (eight) hours as needed for pain.    [provider]  alendronate (FOSAMAX) 10 MG tablet TAKE (1) TABLET BY MOUTH PER WEEK TAKE ON EMPTY STOMACH WITH FULL GLASS OF WATER 09/11/19   [name], MD  allopurinol (ZYLOPRIM) 100 MG tablet TAKE 1 TABLET BY MOUTH EVERY DAY 06/29/21   Persons, Bevely Palmer, PA  calcium carbonate (TUMS - DOSED IN MG ELEMENTAL CALCIUM) 500 MG chewable tablet Chew 1-2 tablets by mouth at bedtime.    [provider]  CALCIUM PO Take 1  tablet by mouth daily.    [provider]  Carboxymethylcellul-Glycerin (LUBRICATING EYE DROPS OP) Place 1 drop into both eyes daily as needed (dry eyes).    [provider]  cholecalciferol (VITAMIN D3) 25 MCG (1000 UT) tablet Take 1,000 Units by mouth daily.    [provider]  clindamycin (CLINDAGEL) 1 % gel APPLY TO AFFECTED AREA TWICE A DAY 04/13/18   [name], MD  colchicine 0.6 MG tablet Take 1 tablet (0.6 mg total) by mouth 2 (two) times daily. Take twice daily until pain resolves and then take as needed for gout flare. 06/15/18   [name], MD  [facility] ANTI-FUNGAL 2 % powder APPLY TO AFFECTED AREA TWICE A DAY AFTER SKIN RESOLVES FROM KETOCONAZOLE CREAM 10/17/14   [name], MD  diazepam (VALIUM) 5 MG tablet TAKE 1/2 TABLET (2.5MG  TOTAL) BY MOUTH AT BEDTIME 12/17/20   Paige, [place] J, DO  ELIQUIS 5 MG TABS tablet TAKE 1 TABLET BY MOUTH TWICE A DAY 01/14/21   Jerline Pain, MD  Flax Oil-Fish Oil-Borage Oil (FISH-FLAX-BORAGE) CAPS Take 1 capsule by mouth daily.    [provider]  furosemide (LASIX) 20 MG tablet TAKE 1 TABLET BY MOUTH EVERY DAY 09/03/21   Jerline Pain, MD  Garlic 371 MG TABS Take by mouth. DAILY    [provider]  glucosamine-chondroitin 500-400 MG tablet Take 1 tablet by mouth daily.     [provider]  HYDROcodone-acetaminophen (NORCO/VICODIN) 5-325 MG tablet Take 1 tablet by mouth every 6 (six) hours as needed for moderate pain. 09/17/19   Sheikh, Omair Latif, DO  lisinopril (ZESTRIL) 20 MG tablet TAKE 1 TABLET BY MOUTH EVERYDAY AT BEDTIME 06/30/21   Paige, [place] J, DO  loratadine (CLARITIN) 10 MG tablet TAKE 1 TABLET  BY MOUTH EVERY DAY 03/15/21   Shary Key, DO  metoprolol succinate (TOPROL-XL) 100 MG 24 hr tablet TAKE 1 TABLET BY MOUTH DAILY. TAKE WITH OR IMMEDIATELY FOLLOWING A MEAL. 06/29/21   Jerline Pain, MD  Multiple Vitamins-Minerals (EMERGEN-C IMMUNE) PACK Take 1 packet by mouth daily.     [provider]  Multiple Vitamins-Minerals (MULTIVITAMIN WOMEN 50+) TABS Take 1 tablet by mouth daily.     [provider]  mupirocin ointment (BACTROBAN) 2 % APPLY TOPICALLY TO AFFECTED AREA TWICE DAILY 02/20/19   [name], MD  Nutritional Supplements (ECHINACEA/GOLDEN SEAL PO) Take 1 tablet by mouth daily.     [provider]  nystatin (MYCOSTATIN/NYSTOP) powder Apply 1 application topically 2 (two) times daily. 04/30/20   Kinnie Feil, MD  pantoprazole (PROTONIX) 40 MG tablet Take 1 tablet (40 mg total) by mouth daily. 09/18/19   Sheikh, Omair Latif, DO  polycarbophil (FIBERCON) 625 MG tablet Take 625 mg by mouth daily.    [provider]  potassium chloride (KLOR-CON) 20 MEQ packet USE 1 PACKET BY MOUTH EVERY DAY 08/11/20   Shary Key, DO  Probiotic Product (PROBIOTIC-10 PO) Take 1 tablet by mouth daily.     [provider]  Propylene Glycol (SYSTANE COMPLETE) 0.6 % SOLN Place 1 drop into both eyes 4 (four) times daily as needed.    [provider]  RESTASIS 0.05 % ophthalmic emulsion INSTILL 1 DROP INTO BOTH EYES TWICE A DAY 06/01/20   Paige, Victoria J, DO  rosuvastatin (CRESTOR) 5 MG tablet Take 5 mg by mouth at bedtime. 03/29/21   [provider]  triamcinolone cream (KENALOG) 0.1 % APPLY TO AFFECTED AREA TWICE A DAY 06/30/21   Paige, Victoria J, DO  Turmeric (QC TUMERIC COMPLEX PO) Take by mouth. WITH CONDIN DAILY    [provider]    Allergies    Flecainide acetate  Review of Systems   Review of Systems  Respiratory:  Positive for cough.   Musculoskeletal:        Left knee pain  All other systems reviewed and are negative.  Physical Exam Updated Vital Signs BP 96/86   Pulse 79   Temp 98.3 F (36.8 C)   Resp 18   Ht 5\' 3"  (1.6 m)   Wt 88 kg   SpO2 100%   BMI 34.37 kg/m   Physical Exam Vitals and nursing note reviewed.  Constitutional:      Appearance: Normal appearance.  HENT:      Head: Normocephalic.      Right Ear: External ear normal.     Left Ear: External ear normal.     Nose: Nose normal.     Mouth/Throat:     Mouth: Mucous membranes are moist.     Pharynx: Oropharynx is clear.  Eyes:     Extraocular Movements: Extraocular movements intact.     Conjunctiva/sclera: Conjunctivae normal.     Pupils: Pupils are equal, round, and reactive to light.  Cardiovascular:     Rate and Rhythm: Normal rate and regular rhythm.     Pulses: Normal pulses.     Heart sounds: Normal heart sounds.  Pulmonary:     Effort: Pulmonary effort is normal.     Breath sounds: Normal breath sounds.  Abdominal:     General: Abdomen is flat. Bowel sounds are normal.     Palpations: Abdomen is soft.  Musculoskeletal:     Cervical back: Normal range of motion  and neck supple.       Legs:  Skin:    General: Skin is warm.     Capillary Refill: Capillary refill takes less than 2 seconds.  Neurological:     General: No focal deficit present.     Mental Status: She is alert and oriented to person, place, and time.  Psychiatric:        Mood and Affect: Mood normal.        Behavior: Behavior normal.        Thought Content: Thought content normal.        Judgment: Judgment normal.    ED Results / Procedures / Treatments   Labs (all labs ordered are listed, but only abnormal results are displayed) Labs Reviewed  RESP PANEL BY RT-PCR (FLU A&B, COVID) [id number]    EKG None  Radiology DG Chest 2 View  Result Date: 09/03/2021 CLINICAL DATA:  Cough. EXAM: CHEST - 2 VIEW COMPARISON:  Prior chest radiographs 09/15/2019 and earlier. FINDINGS: Heart size within normal limits. Subtle opacity within the lateral left lung base. No appreciable airspace consolidation within the right lung. No evidence of pleural effusion or pneumothorax. No acute bony abnormality identified. Degenerative changes of the spine. Thoracic levocurvature. IMPRESSION: Subtle opacity within the lateral left lung  base, which may reflect atelectasis or scarring. Early pneumonia cannot be excluded. Electronically Signed   By: Kellie Simmering D.O.   On: 09/03/2021 14:29   CT Head Wo Contrast  Result Date: 09/03/2021 CLINICAL DATA:  Head trauma, minor. Additional history provided: Patient reports hitting head on Wednesday night, on blood thinners, concern for possible brain bleed. EXAM: CT HEAD WITHOUT CONTRAST TECHNIQUE: Contiguous axial images were obtained from the base of the skull through the vertex without intravenous contrast. COMPARISON:  Brain MRI 05/20/2021.  Head CT 02/23/2016. FINDINGS: Brain: Mild generalized cerebral and cerebellar atrophy. Known chronic lacunar infarcts within the right frontal lobe periventricular white matter and right pons were better appreciated on the prior brain MRI of 05/20/2021. Background moderate patchy and ill-defined hypoattenuation within the cerebral white matter, nonspecific but compatible with chronic small vessel ischemic disease. There is no acute intracranial hemorrhage. No demarcated cortical infarct. No extra-axial fluid collection. No evidence of an intracranial mass. No midline shift. Vascular: No hyperdense vessel.  Atherosclerotic calcifications. Skull: Normal. Negative for fracture or focal lesion. Sinuses/Orbits: Redemonstrated chronic and fairly symmetric lesions within the superomedial orbits bilaterally. These measure 2.0 x 1.0 cm on the right, and 1.6 x 0.8 cm on the left. Mild mucosal thickening within the bilateral ethmoid air cells. Large fluid level, and background mild mucosal thickening, within the left sphenoid sinus. Mild mucosal thickening within the right maxillary sinus at the imaged levels. Other: Left mastoid effusion. Trace fluid also present within the right mastoid air cells. IMPRESSION: No evidence of acute intracranial abnormality. Known chronic lacunar infarcts within the right frontal lobe periventricular white matter and within the right pons.  Background mild cerebral white matter chronic small vessel ischemic changes. Redemonstrated chronic and fairly symmetric lesions within the superomedial orbits, bilaterally. These lesions are incompletely assessed by non-contrast head CT, but may reflect orbital venous varices. A non-emergent orbital MRI with contrast may be obtained for further characterization, as clinically warranted. Paranasal sinus disease, as described. Correlate for acute sinusitis. Left mastoid effusion. Trace fluid also present within the right mastoid air cells. Electronically Signed   By: Kellie Simmering D.O.   On: 09/03/2021 14:26   DG Knee Complete 4 Views  Right  Result Date: 09/03/2021 CLINICAL DATA:  Injury. Additional history provided: Knee pain status post fall on Wednesday night. EXAM: RIGHT KNEE - COMPLETE 4+ VIEW COMPARISON:  Radiographs of the right knee 05/20/2005 FINDINGS: There is normal bony alignment. No evidence of acute osseous or articular abnormality. Mild-to-moderate osteoarthritic changes of the medial tibiofemoral compartment. Spurring along the inferior aspect of the patella. Chondrocalcinosis within the medial and lateral tibiofemoral compartments. IMPRESSION: No evidence of acute osseous or articular abnormality. Mild-to-moderate osteoarthritic changes of the medial tibiofemoral compartment, progressed as compared to the radiographs of 05/20/2005. Chondrocalcinosis within the medial and lateral tibiofemoral compartments. Electronically Signed   By: Kellie Simmering D.O.   On: 09/03/2021 14:33    Procedures Procedures   Medications Ordered in ED Medications  amoxicillin-clavulanate (AUGMENTIN) 875-125 MG per tablet 1 tablet (1 tablet Oral Given 09/03/21 1616)  oxymetazoline (AFRIN) 0.05 % nasal spray 1 spray (1 spray Each Nare Given 09/03/21 1617)    ED Course  I have reviewed the triage vital signs and the nursing notes.  Pertinent labs & imaging results that were available during my care of the  patient were reviewed by me and considered in my medical decision making (see chart for details).    MDM Rules/Calculators/A&P                           Pt does have possible pna on CXR.  Knee x-ray without acute injury.  CT head without anything acute intracranially.  However, she has paranasal sinus disease.  Pt will be started on augmentin, flonase, and tussionex.  She is encouraged to use a neti pot to rinse her sinuses.  Covid/flu test sent and neg.  Pt is able to ambulate with O2 sats 94-96%.    She is stable for d/c.  Return if worse.  Rumi Kolodziej Ciolino was evaluated in Emergency Department on 09/03/2021 for the symptoms described in the history of present illness. She was evaluated in the context of the global COVID-19 pandemic, which necessitated consideration that the patient might be at risk for infection with the SARS-CoV-2 virus that causes COVID-19. Institutional protocols and algorithms that pertain to the evaluation of patients at risk for COVID-19 are in a state of rapid change based on information released by regulatory bodies including the CDC and federal and state organizations. These policies and algorithms were followed during the patient's care in the ED.   Final Clinical Impression(s) / ED Diagnoses Final diagnoses:  Acute non-recurrent pansinusitis  Community acquired pneumonia of left lower lobe of lung  Contusion of other part of head, initial encounter  Contusion of right knee, initial encounter    Rx / DC Orders ED Discharge Orders          Ordered    amoxicillin-clavulanate (AUGMENTIN) 875-125 MG tablet  Every 12 hours        09/03/21 1713    fluticasone (FLONASE) 50 MCG/ACT nasal spray  Daily        09/03/21 1713    chlorpheniramine-HYDROcodone (TUSSIONEX PENNKINETIC ER) 10-8 MG/5ML SUER  Every 12 hours PRN        09/03/21 1714             Isla Pence, MD 09/03/21 [place]    Isla Pence, MD 09/03/21 [phone]

## 2021-09-03 NOTE — ED Notes (Signed)
Patient ambulated w/ pulse ox. O2 sats remained 94-96% during ambulation and HR stable. Patient endorsed some increase in shob, but no desaturations noted.

## 2021-09-03 NOTE — Discharge Instructions (Addendum)
It is ok to start taking your Eliquis again.  Use a Neti pot to rinse out your sinuses.

## 2021-09-05 ENCOUNTER — Other Ambulatory Visit: Payer: Self-pay | Admitting: Cardiology

## 2021-09-05 ENCOUNTER — Other Ambulatory Visit: Payer: Self-pay | Admitting: Family Medicine

## 2021-09-05 DIAGNOSIS — I4821 Permanent atrial fibrillation: Secondary | ICD-10-CM

## 2021-09-06 NOTE — Telephone Encounter (Signed)
Prescription refill request for Eliquis received. Indication: afib  Last office visit: 12/22/2020 Scr: 1.19, 11/04/2020 Age: 80 yo  Weight: 88 kg   Refill sent.

## 2021-09-16 ENCOUNTER — Ambulatory Visit: Payer: Medicare Other | Admitting: Neurology

## 2021-09-29 ENCOUNTER — Other Ambulatory Visit: Payer: Self-pay | Admitting: Family Medicine

## 2021-09-29 DIAGNOSIS — I5032 Chronic diastolic (congestive) heart failure: Secondary | ICD-10-CM

## 2021-10-11 ENCOUNTER — Other Ambulatory Visit: Payer: Self-pay | Admitting: Family Medicine

## 2021-10-11 DIAGNOSIS — I5032 Chronic diastolic (congestive) heart failure: Secondary | ICD-10-CM

## 2021-10-12 ENCOUNTER — Other Ambulatory Visit: Payer: Self-pay | Admitting: Family Medicine

## 2021-10-12 DIAGNOSIS — I5032 Chronic diastolic (congestive) heart failure: Secondary | ICD-10-CM

## 2021-10-14 ENCOUNTER — Ambulatory Visit
Admission: RE | Admit: 2021-10-14 | Discharge: 2021-10-14 | Disposition: A | Discharge status: Home / Self Care | Payer: Medicare Other | Source: Ambulatory Visit | Attending: Student | Admitting: Student

## 2021-10-14 ENCOUNTER — Other Ambulatory Visit: Payer: Self-pay | Admitting: Student

## 2021-10-14 DIAGNOSIS — R053 Chronic cough: Secondary | ICD-10-CM

## 2021-11-01 ENCOUNTER — Other Ambulatory Visit: Payer: Self-pay | Admitting: Family Medicine

## 2021-11-10 ENCOUNTER — Other Ambulatory Visit: Payer: Self-pay | Admitting: Family Medicine

## 2021-11-12 ENCOUNTER — Other Ambulatory Visit: Payer: Self-pay | Admitting: Family Medicine

## 2021-11-29 ENCOUNTER — Other Ambulatory Visit: Payer: Self-pay | Admitting: Family Medicine

## 2021-11-29 DIAGNOSIS — I5032 Chronic diastolic (congestive) heart failure: Secondary | ICD-10-CM

## 2021-12-06 ENCOUNTER — Other Ambulatory Visit: Payer: Self-pay | Admitting: Family Medicine

## 2021-12-06 ENCOUNTER — Telehealth: Payer: Self-pay | Admitting: *Deleted

## 2021-12-06 ENCOUNTER — Other Ambulatory Visit: Payer: Self-pay | Admitting: Physician Assistant

## 2021-12-06 DIAGNOSIS — I5032 Chronic diastolic (congestive) heart failure: Secondary | ICD-10-CM

## 2021-12-06 NOTE — Telephone Encounter (Signed)
Patient is calling because she lost her compression anklets given at [facility], would like to know if she may get another one. Returned the call back to patient, no answer, left message that she may come in to purchase another one at front desk ,not sure of the price.

## 2021-12-08 NOTE — Telephone Encounter (Signed)
Patient has picked up compression anklets today.

## 2021-12-09 ENCOUNTER — Other Ambulatory Visit: Payer: Self-pay

## 2021-12-09 ENCOUNTER — Ambulatory Visit
Admission: RE | Admit: 2021-12-09 | Discharge: 2021-12-09 | Disposition: A | Discharge status: Home / Self Care | Payer: Medicare Other | Source: Ambulatory Visit | Attending: Student | Admitting: Student

## 2021-12-09 ENCOUNTER — Other Ambulatory Visit: Payer: Self-pay | Admitting: Student

## 2021-12-09 DIAGNOSIS — Z8701 Personal history of pneumonia (recurrent): Secondary | ICD-10-CM

## 2021-12-10 ENCOUNTER — Other Ambulatory Visit: Payer: Self-pay | Admitting: Family Medicine

## 2021-12-13 ENCOUNTER — Other Ambulatory Visit: Payer: Self-pay | Admitting: Family Medicine

## 2021-12-13 DIAGNOSIS — I5032 Chronic diastolic (congestive) heart failure: Secondary | ICD-10-CM

## 2021-12-23 ENCOUNTER — Emergency Department (HOSPITAL_BASED_OUTPATIENT_CLINIC_OR_DEPARTMENT_OTHER): Payer: Medicare Other

## 2021-12-23 ENCOUNTER — Emergency Department (HOSPITAL_BASED_OUTPATIENT_CLINIC_OR_DEPARTMENT_OTHER): Payer: Medicare Other | Admitting: Radiology

## 2021-12-23 ENCOUNTER — Other Ambulatory Visit: Payer: Self-pay

## 2021-12-23 ENCOUNTER — Emergency Department (HOSPITAL_BASED_OUTPATIENT_CLINIC_OR_DEPARTMENT_OTHER)
Admission: EM | Admit: 2021-12-23 | Discharge: 2021-12-23 | Disposition: A | Discharge status: Home / Self Care | Payer: Medicare Other | Attending: Emergency Medicine | Admitting: Emergency Medicine

## 2021-12-23 DIAGNOSIS — Z7901 Long term (current) use of anticoagulants: Secondary | ICD-10-CM | POA: Insufficient documentation

## 2021-12-23 DIAGNOSIS — R600 Localized edema: Secondary | ICD-10-CM | POA: Diagnosis not present

## 2021-12-23 DIAGNOSIS — Z7951 Long term (current) use of inhaled steroids: Secondary | ICD-10-CM | POA: Insufficient documentation

## 2021-12-23 DIAGNOSIS — I509 Heart failure, unspecified: Secondary | ICD-10-CM | POA: Diagnosis not present

## 2021-12-23 DIAGNOSIS — M7989 Other specified soft tissue disorders: Secondary | ICD-10-CM | POA: Diagnosis present

## 2021-12-23 LAB — CBC
HCT: 40.9 % (ref 36.0–46.0)
Hemoglobin: 13 g/dL (ref 12.0–15.0)
MCH: 30.7 pg (ref 26.0–34.0)
MCHC: 31.8 g/dL (ref 30.0–36.0)
MCV: 96.5 fL (ref 80.0–100.0)
Platelets: 253 10*3/uL (ref 150–400)
RBC: 4.24 MIL/uL (ref 3.87–5.11)
RDW: 14.5 % (ref 11.5–15.5)
WBC: 9.2 10*3/uL (ref 4.0–10.5)
nRBC: 0 % (ref 0.0–0.2)

## 2021-12-23 LAB — BASIC METABOLIC PANEL
Anion gap: 11 (ref 5–15)
BUN: 23 mg/dL (ref 8–23)
CO2: 27 mmol/L (ref 22–32)
Calcium: 9.6 mg/dL (ref 8.9–10.3)
Chloride: 105 mmol/L (ref 98–111)
Creatinine, Ser: 1.01 mg/dL — ABNORMAL HIGH (ref 0.44–1.00)
GFR, Estimated: 56 mL/min — ABNORMAL LOW (ref >60)
Glucose, Bld: 90 mg/dL (ref 70–99)
Potassium: 4.5 mmol/L (ref 3.5–5.1)
Sodium: 143 mmol/L (ref 135–145)

## 2021-12-23 LAB — BRAIN NATRIURETIC PEPTIDE: B Natriuretic Peptide: 120.2 pg/mL — ABNORMAL HIGH (ref 0.0–100.0)

## 2021-12-23 NOTE — Discharge Instructions (Addendum)
Take 3 tablets of your lasix  Follow up with your Cardiologist   Return for new or worsening symptoms

## 2021-12-23 NOTE — ED Provider Notes (Signed)
[facility] EMERGENCY DEPT Provider Note   CSN: [id number] Arrival date & time: 12/23/21  1742     History  Chief Complaint  Patient presents with   Leg Swelling    Kristen [name] is a 81 y.o. female past medical history significant for CHF, A-fib, varicose veins here for evaluation of lower extremity swelling.  Has had increase in her swelling over the last month.  No chest pain, shortness of breath, PND orthopnea.  She is taking diuretics at home.  States she did have some pain to her posterior aspect left calf yesterday.  She called her vascular specialist today they recommended her coming to the emergency department to rule out DVT.  No surrounding erythema, warmth.  No paresthesias or weakness.  She is compliant with her home meds.  States she has been trying different compression socks at home.  HPI     Home Medications Prior to Admission medications   Medication Sig Start Date End Date Taking? Authorizing Provider  acetaminophen (TYLENOL) 650 MG CR tablet Take 650-1,300 mg by mouth every 8 (eight) hours as needed for pain.    [provider]  alendronate (FOSAMAX) 10 MG tablet TAKE (1) TABLET BY MOUTH PER WEEK TAKE ON EMPTY STOMACH WITH FULL GLASS OF WATER 09/11/19   [name], MD  allopurinol (ZYLOPRIM) 100 MG tablet TAKE 1 TABLET BY MOUTH EVERY DAY 06/29/21   Persons, Bevely Palmer, PA  amoxicillin-clavulanate (AUGMENTIN) 875-125 MG tablet Take 1 tablet by mouth every 12 (twelve) hours. 09/03/21   Isla Pence, MD  calcium carbonate (TUMS - DOSED IN MG ELEMENTAL CALCIUM) 500 MG chewable tablet Chew 1-2 tablets by mouth at bedtime.    [provider]  CALCIUM PO Take 1 tablet by mouth daily.    [provider]  Carboxymethylcellul-Glycerin (LUBRICATING EYE DROPS OP) Place 1 drop into both eyes daily as needed (dry eyes).    [provider]  chlorpheniramine-HYDROcodone (TUSSIONEX PENNKINETIC ER) 10-8 MG/5ML SUER Take 5 mLs  by mouth every 12 (twelve) hours as needed for cough. 09/03/21   Isla Pence, MD  cholecalciferol (VITAMIN D3) 25 MCG (1000 UT) tablet Take 1,000 Units by mouth daily.    [provider]  clindamycin (CLINDAGEL) 1 % gel APPLY TO AFFECTED AREA TWICE A DAY 04/13/18   [name], MD  colchicine 0.6 MG tablet Take 1 tablet (0.6 mg total) by mouth 2 (two) times daily. Take twice daily until pain resolves and then take as needed for gout flare. 06/15/18   [name], MD  [facility] ANTI-FUNGAL 2 % powder APPLY TO AFFECTED AREA TWICE A DAY AFTER SKIN RESOLVES FROM KETOCONAZOLE CREAM 10/17/14   [name], MD  diazepam (VALIUM) 5 MG tablet TAKE 1/2 TABLET (2.5MG  TOTAL) BY MOUTH AT BEDTIME 12/17/20   Paige, [place] J, DO  ELIQUIS 5 MG TABS tablet TAKE 1 TABLET BY MOUTH TWICE A DAY 09/06/21   Jerline Pain, MD  Flax Oil-Fish Oil-Borage Oil (FISH-FLAX-BORAGE) CAPS Take 1 capsule by mouth daily.    [provider]  fluticasone (FLONASE) 50 MCG/ACT nasal spray Place 1 spray into both nostrils daily. 09/03/21   Isla Pence, MD  furosemide (LASIX) 20 MG tablet TAKE 1 TABLET BY MOUTH EVERY DAY 09/03/21   Jerline Pain, MD  Garlic 314 MG TABS Take by mouth. DAILY    [provider]  glucosamine-chondroitin 500-400 MG tablet Take 1 tablet by mouth daily.     [provider]  HYDROcodone-acetaminophen (NORCO/VICODIN) 5-325 MG tablet Take 1 tablet by mouth every 6 (six) hours as needed for moderate pain. 09/17/19   Sheikh, Omair Latif, DO  lisinopril (ZESTRIL) 20 MG tablet TAKE 1 TABLET BY MOUTH EVERYDAY AT BEDTIME 06/30/21   Paige, [place] J, DO  loratadine (CLARITIN) 10 MG tablet TAKE 1 TABLET BY MOUTH EVERY DAY 03/15/21   Shary Key, DO  metoprolol succinate (TOPROL-XL) 100 MG 24 hr tablet TAKE 1 TABLET BY MOUTH DAILY. TAKE WITH OR IMMEDIATELY FOLLOWING A MEAL. 06/29/21   Jerline Pain, MD  Multiple Vitamins-Minerals (EMERGEN-C IMMUNE) PACK Take 1 packet by mouth  daily.    [provider]  Multiple Vitamins-Minerals (MULTIVITAMIN WOMEN 50+) TABS Take 1 tablet by mouth daily.     [provider]  mupirocin ointment (BACTROBAN) 2 % APPLY TOPICALLY TO AFFECTED AREA TWICE DAILY 02/20/19   [name], MD  Nutritional Supplements (ECHINACEA/GOLDEN SEAL PO) Take 1 tablet by mouth daily.     [provider]  nystatin (MYCOSTATIN/NYSTOP) powder Apply 1 application topically 2 (two) times daily. 04/30/20   Kinnie Feil, MD  pantoprazole (PROTONIX) 40 MG tablet Take 1 tablet (40 mg total) by mouth daily. 09/18/19   Sheikh, Omair Latif, DO  polycarbophil (FIBERCON) 625 MG tablet Take 625 mg by mouth daily.    [provider]  potassium chloride (KLOR-CON) 20 MEQ packet USE 1 PACKET BY MOUTH EVERY DAY 08/11/20   Shary Key, DO  Probiotic Product (PROBIOTIC-10 PO) Take 1 tablet by mouth daily.     [provider]  Propylene Glycol (SYSTANE COMPLETE) 0.6 % SOLN Place 1 drop into both eyes 4 (four) times daily as needed.    [provider]  RESTASIS 0.05 % ophthalmic emulsion INSTILL 1 DROP INTO BOTH EYES TWICE A DAY 06/01/20   Paige, Victoria J, DO  rosuvastatin (CRESTOR) 5 MG tablet Take 5 mg by mouth at bedtime. 03/29/21   [provider]  triamcinolone cream (KENALOG) 0.1 % APPLY TO AFFECTED AREA TWICE A DAY 06/30/21   Paige, Victoria J, DO  Turmeric (QC TUMERIC COMPLEX PO) Take by mouth. WITH CONDIN DAILY    [provider]      Allergies    Flecainide acetate    Review of Systems   Review of Systems  Constitutional: Negative.   HENT: Negative.    Respiratory: Negative.    Cardiovascular:  Positive for leg swelling.  Gastrointestinal: Negative.   Genitourinary: Negative.   Musculoskeletal: Negative.   Skin: Negative.   Neurological: Negative.   All other systems reviewed and are negative.  Physical Exam Updated Vital Signs BP 136/87 (BP Location: Right Arm)    Pulse 89     Temp 98.4 F (36.9 C) (Oral)    Resp 14    Ht 5\' 3"  (1.6 m)    Wt 93 kg    SpO2 100%    BMI 36.31 kg/m  Physical Exam Vitals and nursing note reviewed.  Constitutional:      General: She is not in acute distress.    Appearance: She is well-developed. She is not ill-appearing, toxic-appearing or diaphoretic.  HENT:     Head: Normocephalic and atraumatic.     Nose: Nose normal.     Mouth/Throat:     Mouth: Mucous membranes are moist.  Eyes:     Pupils: Pupils are equal, round, and reactive to light.  Cardiovascular:     Rate and Rhythm: Normal rate.  Pulses: Normal pulses.          Dorsalis pedis pulses are 2+ on the right side and 2+ on the left side.       Posterior tibial pulses are 2+ on the right side and 2+ on the left side.     Heart sounds: Normal heart sounds.  Pulmonary:     Effort: Pulmonary effort is normal. No respiratory distress.     Breath sounds: Normal breath sounds.     Comments: Clear bilaterally, speaks in full sentences without difficulty. Abdominal:     General: There is no distension.     Palpations: Abdomen is soft.     Tenderness: There is no abdominal tenderness. There is no right CVA tenderness, left CVA tenderness, guarding or rebound.  Musculoskeletal:        General: Normal range of motion.     Cervical back: Normal range of motion.     Right lower leg: Edema present.     Left lower leg: Edema present.     Comments: 1+ non-pitting edema to bilateral lower extremities.  No bony tenderness.  Compartments soft, posterior calves nontender  Skin:    General: Skin is warm and dry.     Capillary Refill: Capillary refill takes less than 2 seconds.  Neurological:     General: No focal deficit present.     Mental Status: She is alert and oriented to person, place, and time.  Psychiatric:        Mood and Affect: Mood normal.    ED Results / Procedures / Treatments   Labs (all labs ordered are listed, but only abnormal results are displayed) Labs  Reviewed  BASIC METABOLIC PANEL - Abnormal; Notable for the following components:      Result Value   Creatinine, Ser 1.01 (*)    GFR, Estimated 56 (*)    All other components within normal limits  BRAIN NATRIURETIC PEPTIDE - Abnormal; Notable for the following components:   B Natriuretic Peptide 120.2 (*)    All other components within normal limits  CBC    EKG None  Radiology DG Chest 2 View  Result Date: 12/23/2021 CLINICAL DATA:  sob, le edema EXAM: CHEST - 2 VIEW COMPARISON:  Chest x-ray 12/09/2021, CT chest 02/23/2016 FINDINGS: The heart and mediastinal contours are within normal limits. No focal consolidation. No pulmonary edema. No pleural effusion. No pneumothorax. No acute osseous abnormality. IMPRESSION: No active cardiopulmonary disease. Electronically Signed   By: Iven Finn M.D.   On: 12/23/2021 18:34   US Venous Img Lower Bilateral  Result Date: 12/23/2021 CLINICAL DATA:  Shortness of breath, lower extremity edema EXAM: BILATERAL LOWER EXTREMITY VENOUS DOPPLER ULTRASOUND TECHNIQUE: Gray-scale sonography with compression, as well as color and duplex ultrasound, were performed to evaluate the deep venous system(s) from the level of the common femoral vein through the popliteal and proximal calf veins. COMPARISON:  None. FINDINGS: VENOUS Normal compressibility of the common femoral, superficial femoral, and popliteal veins, as well as the visualized calf veins. Visualized portions of profunda femoral vein and great saphenous vein unremarkable. No filling defects to suggest DVT on grayscale or color Doppler imaging. Doppler waveforms show normal direction of venous flow, normal respiratory plasticity and response to augmentation. OTHER None. Limitations: none IMPRESSION: Negative. Electronically Signed   By: Julian Hy M.D.   On: 12/23/2021 19:16    Procedures Procedures    Medications Ordered in ED Medications - No data to display  ED Course/ Medical  Decision  Making/ A&P    81 year old history of CHF, varicose veins, A-fib here for evaluation of lower extremity swelling worsening over the last month.  Apparently developed some left lateral calf pain yesterday which has currently resolved.  She called her vascular specialist to told her to come here for [place] evaluation to r/o DVT.  Patient is on chronic anticoagulation, has not missed any doses.  She denies any chest pain, shortness of breath, PND orthopnea.  No known traumatic injuries.  She is neurovascularly intact.  Labs and imaging personally viewed and interpreted:  CBC without leukocytosis Metabolic panel without electrolyte, renal or liver abnormality BNP 120, down from prior Chest x-ray without cardiomegaly, vascular congestion EKG rate controlled A-fib [place] neg for dvt  Patient reassessed. Discussed labs and imaging. Will have her increased her Lasix over the next few days and touch base with cardiology/ vascular.  Question if patient's lower extremity edema is due to more venous insufficiency.  Reassuring no pulmonary edema at this time.  Without any tachycardia, tachypnea or hypoxia.  No evidence of acute infectious process on exam.  The patient has been appropriately medically screened and/or stabilized in the ED. I have low suspicion for any other emergent medical condition which would require further screening, evaluation or treatment in the ED or require inpatient management.  Patient is hemodynamically stable and in no acute distress.  Patient able to ambulate in department prior to ED.  Evaluation does not show acute pathology that would require ongoing or additional emergent interventions while in the emergency department or further inpatient treatment.  I have discussed the diagnosis with the patient and answered all questions.  Pain is been managed while in the emergency department and patient has no further complaints prior to discharge.  Patient is comfortable with plan discussed in  room and is stable for discharge at this time.  I have discussed strict return precautions for returning to the emergency department.  Patient was encouraged to follow-up with PCP/specialist refer to at discharge.                           Medical Decision Making Amount and/or Complexity of Data Reviewed External Data Reviewed: labs and radiology. Labs: ordered. Decision-making details documented in ED Course. Radiology: ordered and independent interpretation performed. Decision-making details documented in ED Course. ECG/medicine tests: ordered and independent interpretation performed. Decision-making details documented in ED Course.  Risk OTC drugs. Prescription drug management. Risk Details: Do not feel patient needs additional labs, imaging, hospitalization at this time    Mali vas 4, patient already on anticoagulation       Final Clinical Impression(s) / ED Diagnoses Final diagnoses:  Lower extremity edema    Rx / DC Orders ED Discharge Orders     None         Tamla Winkels A, PA-C 12/23/21 Dorothea Glassman, MD 12/27/21 1635

## 2021-12-23 NOTE — ED Notes (Signed)
Patient transported to X-ray 

## 2021-12-23 NOTE — ED Triage Notes (Signed)
Bilateral lower leg swelling x 1 month, pt is being seen by [place] vein specialist for varicose veins. Hx of CHF and Afib.  Pt A&Ox4, ambulatory to ED

## 2022-01-06 ENCOUNTER — Telehealth: Payer: Self-pay | Admitting: Podiatry

## 2022-01-06 NOTE — Telephone Encounter (Signed)
Patient is being referred by the hospital to Vascular due to the swelling in her feet she would like you to look at the testing she had done at [facility] and get your opinion on this. Please Advise

## 2022-01-07 ENCOUNTER — Other Ambulatory Visit: Payer: Self-pay | Admitting: Family Medicine

## 2022-01-10 NOTE — Telephone Encounter (Signed)
Haven't seen this patient for 6+ months and only once. Recommend f/u in office if she would like a 2nd opinion. - Dr. Amalia Hailey

## 2022-01-11 NOTE — Telephone Encounter (Signed)
Lmom to call back and schedule appointment.

## 2022-01-13 ENCOUNTER — Other Ambulatory Visit: Payer: Self-pay | Admitting: Family Medicine

## 2022-01-17 ENCOUNTER — Ambulatory Visit (INDEPENDENT_AMBULATORY_CARE_PROVIDER_SITE_OTHER): Payer: Medicare Other | Admitting: Podiatry

## 2022-01-17 ENCOUNTER — Ambulatory Visit (HOSPITAL_COMMUNITY)
Admission: RE | Admit: 2022-01-17 | Discharge: 2022-01-17 | Disposition: A | Discharge status: Home / Self Care | Payer: Medicare Other | Source: Ambulatory Visit | Attending: Vascular Surgery | Admitting: Vascular Surgery

## 2022-01-17 ENCOUNTER — Ambulatory Visit: Payer: Medicare Other | Admitting: Podiatry

## 2022-01-17 ENCOUNTER — Other Ambulatory Visit: Payer: Self-pay

## 2022-01-17 DIAGNOSIS — I872 Venous insufficiency (chronic) (peripheral): Secondary | ICD-10-CM

## 2022-01-17 DIAGNOSIS — I83891 Varicose veins of right lower extremities with other complications: Secondary | ICD-10-CM

## 2022-01-17 DIAGNOSIS — R600 Localized edema: Secondary | ICD-10-CM | POA: Diagnosis not present

## 2022-01-17 NOTE — Progress Notes (Signed)
? ?HPI: 81 y.o. female presenting today for second opinion regarding swelling to the bilateral lower extremities.  Patient states that she has been suffering from swelling to the lower extremities for several months however she went to the emergency department due to acute increased swelling.  She currently has a referral to vein and vascular specialists pending.  She was last seen here in our office on 06/02/2021 for the same condition. ? ?Past Medical History:  ?Diagnosis Date  ? Abscess of abdominal cavity ([place]) 10/02/2019  ? Anticoagulant long-term use   ? elquis -- managed by cardiology  ? Anxiety   ? SITUATIONAL IN PAST  ? Atrial fibrillation ([place])   ? Chronic calculous cholecystitis   ? Chronic kidney disease   ? ckd stage 4 per lov dr Corliss Parish 09-28-2020 on chart  ? Chronic venous insufficiency   ? w/  varicose veins right worse than left waers compresiion stocking prn  ? Depression   ? SITUATIONAL IN PAST  ? Diastolic CHF, chronic ([place]) 2010  ? followed by cardiology HX OF 2010 DUE TO FLECANIDE  ? Diverticulosis of colon   ? Dyspnea   ? occ with heavt activity and at bedtime when tired for last few months per pt  ? Dysrhythmia   ? a-fib  ? Edema of both lower extremities   ? per pt mostly in summer time  ? Essential hypertension, benign   ? Fibrocystic breast disease   ? Full dentures   ? GERD (gastroesophageal reflux disease)   ? occasional tums and does not eat prior to bedtime  ? Gout   ? 08-19-2019  per pt last episode DEC 2021 LEFT HAND  ? Hiatal hernia   ? History of diverticulitis 01/22/2016  ? History of recurrent UTIs   ? NONE RECNET SEES DR WRENN  ? Hx of cholecystectomy 10/02/2019  ? Hyperlipidemia   ? Insomnia   ? Migraines   ? OA (osteoarthritis)   ? knees, lower back  ? Permanent atrial fibrillation [facility]) cardiologist--- dr Agustin Cree  ? first dx 10/ 2011--- histroy DCCV 11-25-2010 by dr Wynonia Lawman (pt's previous cardiologist) and Cardiac cath 12-17-2010 no significant disease  ?  Pneumonia 08/2019  ? COLLAPSED LEFT LUNG AND PNEUMONIA  ? Scoliosis   ? Stress incontinence in female   ? ?  ?Physical Exam: ?General: The patient is alert and oriented x3 in no acute distress. ? ?Dermatology: Skin is warm, dry and supple bilateral lower extremities. Negative for open lesions or macerations. ? ?Vascular: Palpable pedal pulses bilaterally.  Varicosities noted bilateral lower extremities.  Chronic lower extremity bilateral edema and also noted.  Pitting edema. ? ?Neurological: Epicritic and protective threshold grossly intact bilaterally.  ? ?Musculoskeletal Exam: No pedal deformities noted ? ?Assessment: ?1.  Bilateral lower extremity edema with intermittent bruising likely secondary to the long-term use of Eliquis ? ? ? ?Plan of Care:  ?1. Patient evaluated. ?2.  Recommend compression daily ?3.  Patient has an appointment with vein and vascular pending already ?4.  Return to clinic as needed ? ?  ?  ?Edrick Kins, DPM ?[place] ? ?Dr. Edrick Kins, DPM  ?  ?[address]                                        ?[place], [place] [address]                ?  Office [phone]  ?Fax [phone] ? ? ? ? ?

## 2022-01-19 ENCOUNTER — Encounter: Payer: Self-pay | Admitting: Vascular Surgery

## 2022-01-19 ENCOUNTER — Ambulatory Visit (INDEPENDENT_AMBULATORY_CARE_PROVIDER_SITE_OTHER): Payer: Medicare Other | Admitting: Vascular Surgery

## 2022-01-19 ENCOUNTER — Other Ambulatory Visit: Payer: Self-pay

## 2022-01-19 VITALS — BP 142/71 | HR 84 | Temp 98.6°F | Resp 18 | Ht 60.0 in | Wt 210.0 lb

## 2022-01-19 DIAGNOSIS — I872 Venous insufficiency (chronic) (peripheral): Secondary | ICD-10-CM | POA: Diagnosis not present

## 2022-01-19 NOTE — Progress Notes (Signed)
ASSESSMENT & PLAN   CHRONIC VENOUS INSUFFICIENCY: The patient does have some deep venous reflux on the right.  Based on her exam and history I believe she has combined chronic venous insufficiency and lymphedema.  She does not have any significant superficial venous reflux on the right.  We have discussed the importance of intermittent leg elevation and the proper positioning for this.  We will try to get her fitted for some knee-high compression stockings once her swelling has improved.  I encouraged her to avoid prolonged sitting and standing.  We discussed importance of exercise specifically walking and water aerobics.  In addition we discussed the importance of maintaining a healthy weight.  If her swelling progresses I will be happy to see her back at any time.  The only other consideration would be a lymphedema pump.  REASON FOR CONSULT:    Chronic venous insufficiency.  The consult is requested by [name], NP.  HPI:   Kristen [name] is a 81 y.o. female who was referred with chronic venous insufficiency.  I have reviewed the records from the referring office.  The patient was seen on 01/12/2022.  She does have a history of atrial fibrillation and is on Eliquis.  The patient also has a history of stage IV chronic kidney disease hypertension and stage be ACC/AHA asymptomatic heart failure.  In addition she had venous insufficiency and varicose veins on exam and is sent for vascular consultation.  On my history, the patient's chief complaint is bilateral lower extremity swelling.  Her swelling is worse on the right side.  She has had swelling for many years.  She has had a previous ankle fracture on the right which is contributed some to her swelling.  She is on Eliquis for history of atrial fibrillation.  She denies any previous history of DVT or history of phlebitis.  She has had this injection therapy by [facility] but does not think that she has had any laser ablation  procedures.  She does not have any significant symptoms associated with her swelling.  She has been elevating her legs some which helps.  Past Medical History:  Diagnosis Date   Abscess of abdominal cavity (HCC) 10/02/2019   Anticoagulant long-term use    elquis -- managed by cardiology   Anxiety    SITUATIONAL IN PAST   Atrial fibrillation (HCC)    Chronic calculous cholecystitis    Chronic kidney disease    ckd stage 4 per lov dr [name] 09-28-2020 on chart   Chronic venous insufficiency    w/  varicose veins right worse than left waers compresiion stocking prn   Depression    SITUATIONAL IN PAST   Diastolic CHF, chronic (HCC) 2010   followed by cardiology HX OF 2010 DUE TO FLECANIDE   Diverticulosis of colon    Dyspnea    occ with heavt activity and at bedtime when tired for last few months per pt   Dysrhythmia    a-fib   Edema of both lower extremities    per pt mostly in summer time   Essential hypertension, benign    Fibrocystic breast disease    Full dentures    GERD (gastroesophageal reflux disease)    occasional tums and does not eat prior to bedtime   Gout    08-19-2019  per pt last episode DEC 2021 LEFT HAND   Hiatal hernia    History of diverticulitis 01/22/2016   History of recurrent UTIs  NONE RECNET SEES DR WRENN   Hx of cholecystectomy 10/02/2019   Hyperlipidemia    Insomnia    Migraines    OA (osteoarthritis)    knees, lower back   Permanent atrial fibrillation [facility]) cardiologist--- [name]   first dx 10/ 2011--- histroy DCCV 11-25-2010 by [name] (pt's previous cardiologist) and Cardiac cath 12-17-2010 no significant disease   Pneumonia 08/2019   COLLAPSED LEFT LUNG AND PNEUMONIA   Scoliosis    Stress incontinence in female     Family History  Problem Relation Age of Onset   Cancer Mother        ovarian   Kidney disease Father    Alcohol abuse Father    Cancer Maternal Aunt        lung- smoker   Heart disease Maternal  Uncle     SOCIAL HISTORY: Social History   Tobacco Use   Smoking status: Never   Smokeless tobacco: Never  Substance Use Topics   Alcohol use: Not Currently    Allergies  Allergen Reactions   Flecainide Acetate Anaphylaxis, Shortness Of Breath and Swelling     (hospitalized)    Current Outpatient Medications  Medication Sig Dispense Refill   acetaminophen (TYLENOL) 650 MG CR tablet Take 650-1,300 mg by mouth every 8 (eight) hours as needed for pain.     alendronate (FOSAMAX) 10 MG tablet TAKE (1) TABLET BY MOUTH PER WEEK TAKE ON EMPTY STOMACH WITH FULL GLASS OF WATER 4 tablet 10   allopurinol (ZYLOPRIM) 100 MG tablet TAKE 1 TABLET BY MOUTH EVERY DAY 90 tablet 1   calcium carbonate (TUMS - DOSED IN MG ELEMENTAL CALCIUM) 500 MG chewable tablet Chew 1-2 tablets by mouth at bedtime.     CALCIUM PO Take 1 tablet by mouth daily.     Carboxymethylcellul-Glycerin (LUBRICATING EYE DROPS OP) Place 1 drop into both eyes daily as needed (dry eyes).     chlorpheniramine-HYDROcodone (TUSSIONEX PENNKINETIC ER) 10-8 MG/5ML SUER Take 5 mLs by mouth every 12 (twelve) hours as needed for cough. 140 mL 0   cholecalciferol (VITAMIN D3) 25 MCG (1000 UT) tablet Take 1,000 Units by mouth daily.     clindamycin (CLINDAGEL) 1 % gel APPLY TO AFFECTED AREA TWICE A DAY 30 g 0   colchicine 0.6 MG tablet Take 1 tablet (0.6 mg total) by mouth 2 (two) times daily. Take twice daily until pain resolves and then take as needed for gout flare. 60 tablet 1   [facility] ANTI-FUNGAL 2 % powder APPLY TO AFFECTED AREA TWICE A DAY AFTER SKIN RESOLVES FROM KETOCONAZOLE CREAM 85 g 1   diazepam (VALIUM) 5 MG tablet TAKE 1/2 TABLET (2.5MG  TOTAL) BY MOUTH AT BEDTIME 30 tablet 0   ELIQUIS 5 MG TABS tablet TAKE 1 TABLET BY MOUTH TWICE A DAY 180 tablet 1   Flax Oil-Fish Oil-Borage Oil (FISH-FLAX-BORAGE) CAPS Take 1 capsule by mouth daily.     fluticasone (FLONASE) 50 MCG/ACT nasal spray Place 1 spray into both nostrils daily. 15.8 mL 2    furosemide (LASIX) 20 MG tablet TAKE 1 TABLET BY MOUTH EVERY DAY 90 tablet 2   Garlic 100 MG TABS Take by mouth. DAILY     glucosamine-chondroitin 500-400 MG tablet Take 1 tablet by mouth daily.      HYDROcodone-acetaminophen (NORCO/VICODIN) 5-325 MG tablet Take 1 tablet by mouth every 6 (six) hours as needed for moderate pain. 10 tablet 0   lisinopril (ZESTRIL) 20 MG tablet TAKE 1 TABLET BY MOUTH EVERYDAY  AT BEDTIME (Patient taking differently: daily. Takes 1/2 tablet daily at bedtime.) 90 tablet 0   loratadine (CLARITIN) 10 MG tablet TAKE 1 TABLET BY MOUTH EVERY DAY 90 tablet 1   metoprolol succinate (TOPROL-XL) 100 MG 24 hr tablet TAKE 1 TABLET BY MOUTH DAILY. TAKE WITH OR IMMEDIATELY FOLLOWING A MEAL. 90 tablet 2   Multiple Vitamins-Minerals (EMERGEN-C IMMUNE) PACK Take 1 packet by mouth daily.     Multiple Vitamins-Minerals (MULTIVITAMIN WOMEN 50+) TABS Take 1 tablet by mouth daily.      mupirocin ointment (BACTROBAN) 2 % APPLY TOPICALLY TO AFFECTED AREA TWICE DAILY 22 g 10   Nutritional Supplements (ECHINACEA/GOLDEN SEAL PO) Take 1 tablet by mouth daily.      nystatin (MYCOSTATIN/NYSTOP) powder Apply 1 application topically 2 (two) times daily. 15 g 0   polycarbophil (FIBERCON) 625 MG tablet Take 625 mg by mouth daily.     potassium chloride (KLOR-CON) 20 MEQ packet USE 1 PACKET BY MOUTH EVERY DAY 90 packet 3   Probiotic Product (PROBIOTIC-10 PO) Take 1 tablet by mouth daily.      Propylene Glycol (SYSTANE COMPLETE) 0.6 % SOLN Place 1 drop into both eyes 4 (four) times daily as needed.     rosuvastatin (CRESTOR) 5 MG tablet Take 5 mg by mouth at bedtime.     triamcinolone cream (KENALOG) 0.1 % APPLY TO AFFECTED AREA TWICE A DAY 30 g 0   Turmeric (QC TUMERIC COMPLEX PO) Take by mouth. WITH CONDIN DAILY     amoxicillin-clavulanate (AUGMENTIN) 875-125 MG tablet Take 1 tablet by mouth every 12 (twelve) hours. (Patient not taking: Reported on 01/19/2022) 14 tablet 0   pantoprazole (PROTONIX) 40  MG tablet Take 1 tablet (40 mg total) by mouth daily. (Patient not taking: Reported on 01/19/2022)     RESTASIS 0.05 % ophthalmic emulsion INSTILL 1 DROP INTO BOTH EYES TWICE A DAY (Patient not taking: Reported on 01/19/2022) 60 mL 0   No current facility-administered medications for this visit.    REVIEW OF SYSTEMS:  [X]  denotes positive finding, [ ]  denotes negative finding Cardiac  Comments:  Chest pain or chest pressure:    Shortness of breath upon exertion:    Short of breath when lying flat:    Irregular heart rhythm:        Vascular    Pain in calf, thigh, or hip brought on by ambulation:    Pain in feet at night that wakes you up from your sleep:     Blood clot in your veins:    Leg swelling:  x       Pulmonary    Oxygen at home:    Productive cough:     Wheezing:         Neurologic    Sudden weakness in arms or legs:     Sudden numbness in arms or legs:     Sudden onset of difficulty speaking or slurred speech:    Temporary loss of vision in one eye:     Problems with dizziness:         Gastrointestinal    Blood in stool:     Vomited blood:         Genitourinary    Burning when urinating:     Blood in urine:        Psychiatric    Major depression:         Hematologic    Bleeding problems:    Problems with blood clotting too  easily:        Skin    Rashes or ulcers:        Constitutional    Fever or chills:    -  PHYSICAL EXAM:   Vitals:   01/19/22 1326  BP: (!) 142/71  Pulse: 84  Resp: 18  Temp: 98.6 F (37 C)  TempSrc: Temporal  SpO2: 97%  Weight: 210 lb (95.3 kg)  Height: 5' (1.524 m)   Body mass index is 41.01 kg/m. GENERAL: The patient is a well-nourished female, in no acute distress. The vital signs are documented above. CARDIAC: There is a regular rate and rhythm.  VASCULAR: I do not detect carotid bruits. She has biphasic Doppler signals in both feet. She has bilateral lower extremity swelling which is worse on the right side.   She has nonpitting edema consistent with lymphedema.     PULMONARY: There is good air exchange bilaterally without wheezing or rales. ABDOMEN: Soft and non-tender with normal pitched bowel sounds.  MUSCULOSKELETAL: There are no major deformities. NEUROLOGIC: No focal weakness or paresthesias are detected. SKIN: There are no ulcers or rashes noted. PSYCHIATRIC: The patient has a normal affect.  DATA:    12/23/2021: She had a bilateral lower extremity venous duplex scan which showed no evidence of DVT.  01/17/2022: She had a right lower extremity venous duplex scan.  This showed no evidence of DVT.  There was deep venous reflux involving the common femoral vein.  There was no significant superficial venous reflux.  Waverly [facility]

## 2022-01-28 ENCOUNTER — Other Ambulatory Visit: Payer: Self-pay | Admitting: Physician Assistant

## 2022-01-28 ENCOUNTER — Other Ambulatory Visit: Payer: Self-pay | Admitting: Family Medicine

## 2022-02-08 ENCOUNTER — Encounter: Payer: Self-pay | Admitting: Emergency Medicine

## 2022-02-08 ENCOUNTER — Ambulatory Visit
Admission: EM | Admit: 2022-02-08 | Discharge: 2022-02-08 | Disposition: A | Discharge status: Home / Self Care | Payer: Medicare Other | Attending: Internal Medicine | Admitting: Internal Medicine

## 2022-02-08 DIAGNOSIS — Z23 Encounter for immunization: Secondary | ICD-10-CM

## 2022-02-08 DIAGNOSIS — L03116 Cellulitis of left lower limb: Secondary | ICD-10-CM | POA: Diagnosis not present

## 2022-02-08 DIAGNOSIS — S81812A Laceration without foreign body, left lower leg, initial encounter: Secondary | ICD-10-CM

## 2022-02-08 DIAGNOSIS — S80812A Abrasion, left lower leg, initial encounter: Secondary | ICD-10-CM

## 2022-02-08 MED ORDER — TETANUS-DIPHTH-ACELL PERTUSSIS 5-2.5-18.5 LF-MCG/0.5 IM SUSY
0.5000 mL | PREFILLED_SYRINGE | Freq: Once | INTRAMUSCULAR | Status: AC
  Administered 2022-02-08: 0.5 mL via INTRAMUSCULAR

## 2022-02-08 MED ORDER — AMOXICILLIN-POT CLAVULANATE 875-125 MG PO TABS: 1.0000 | ORAL_TABLET | Freq: Two times a day (BID) | ORAL | 0 refills | Status: DC

## 2022-02-08 NOTE — ED Provider Notes (Addendum)
?Kristen [name] ? ? ? ?CSN: [id number] ?Arrival date & time: 02/08/22  1924 ? ? ?  ? ?History   ?Chief Complaint ?Chief Complaint  ?Patient presents with  ? Leg Pain  ? ? ?HPI ?Kristen [name] is a 81 y.o. female.  ? ?Patient presents with left leg laceration that occurred approximately 2 days ago after walking through her house and cutting her leg on a cardboard box.  She is concerned due to increased redness that started today and concerned for infection.  Patient reports that she has lymphedema and venous insufficiency in her legs so she is concerned for infection.  Denies any numbness or tingling.  Patient is not sure of last tetanus vaccine.  Denies fevers, body aches, chills. ? ? ?Leg Pain ? ?Past Medical History:  ?Diagnosis Date  ? Abscess of abdominal cavity ([place]) 10/02/2019  ? Anticoagulant long-term use   ? elquis -- managed by cardiology  ? Anxiety   ? SITUATIONAL IN PAST  ? Atrial fibrillation ([name])   ? Chronic calculous cholecystitis   ? Chronic kidney disease   ? ckd stage 4 per lov dr Corliss Parish 09-28-2020 on chart  ? Chronic venous insufficiency   ? w/  varicose veins right worse than left waers compresiion stocking prn  ? Depression   ? SITUATIONAL IN PAST  ? Diastolic CHF, chronic ([name]) 2010  ? followed by cardiology HX OF 2010 DUE TO FLECANIDE  ? Diverticulosis of colon   ? Dyspnea   ? occ with heavt activity and at bedtime when tired for last few months per pt  ? Dysrhythmia   ? a-fib  ? Edema of both lower extremities   ? per pt mostly in summer time  ? Essential hypertension, benign   ? Fibrocystic breast disease   ? Full dentures   ? GERD (gastroesophageal reflux disease)   ? occasional tums and does not eat prior to bedtime  ? Gout   ? 08-19-2019  per pt last episode DEC 2021 LEFT HAND  ? Hiatal hernia   ? History of diverticulitis 01/22/2016  ? History of recurrent UTIs   ? NONE RECNET SEES DR WRENN  ? Hx of cholecystectomy 10/02/2019  ? Hyperlipidemia   ? Insomnia   ?  Migraines   ? OA (osteoarthritis)   ? knees, lower back  ? Permanent atrial fibrillation [facility]) cardiologist--- dr Agustin Cree  ? first dx 10/ 2011--- histroy DCCV 11-25-2010 by dr Wynonia Lawman (pt's previous cardiologist) and Cardiac cath 12-17-2010 no significant disease  ? Pneumonia 08/2019  ? COLLAPSED LEFT LUNG AND PNEUMONIA  ? Scoliosis   ? Stress incontinence in female   ? ? ?Patient Active Problem List  ? Diagnosis Date Noted  ? Occipital neuralgia of right side 06/10/2021  ? History of lacunar cerebrovascular accident 06/10/2021  ? 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 Ganglion cyst of volar aspect of right wrist 12/07/2020  ? Chronic daily headache 07/03/2020  ? Elevated serum creatinine 05/04/2020  ? Muscle spasms of both lower extremities 05/04/2020  ? Seborrheic keratoses 04/30/2020  ? Skin tag 04/08/2020  ? Fatigue 03/17/2020  ? Unsatisfactory living conditions 10/23/2019  ? Hx of cholecystectomy 10/02/2019  ? Acute renal failure (ARF) ([place]) 08/29/2019  ? A-fib ([name]) 08/29/2019  ? Varicose veins of leg with edema, right 05/21/2019  ? Bunion of great toe of right foot 05/21/2019  ? Osteoarthritis 03/13/2018  ? Gait abnormality 03/13/2018  ? Flat wart 02/13/2018  ? Situational mixed  anxiety and depressive disorder 10/27/2017  ? Abnormal Pap smear of cervix 02/08/2016  ? Chronic kidney disease 02/08/2016  ? Urinary frequency 02/08/2016  ? History of diverticulitis 01/22/2016  ? Anxiety state 10/17/2014  ? Long-term (current) use of anticoagulants 06/10/2014  ? Chronic venous insufficiency 06/10/2014  ? Obesity (BMI 30-39.9)   ? Acute on chronic diastolic CHF (congestive heart failure) ([place])   ? Candidal intertrigo 06/09/2014  ? Insomnia 01/10/2011  ? Permanent atrial fibrillation ([place])   ? OSTEOPENIA 01/31/2008  ? Hypertensive heart disease   ? ? ?Past Surgical History:  ?Procedure Laterality Date  ? BILIARY STENT PLACEMENT N/A 09/03/2019  ? Procedure: BILIARY STENT PLACEMENT;  Surgeon: Clarene Essex,  MD;  Location: [facility];  Service: Endoscopy;  Laterality: N/A;  ? BLEPHAROPLASTY Bilateral 02-22-2010  dr Georgia Lopes  ? upper eyelid's  ? both knees cortisone injection  08/18/2020  ? dr Noemi Chapel  ? BUNIONECTOMY Right 2003  ? CATARACT EXTRACTION W/ INTRAOCULAR LENS  IMPLANT, BILATERAL  2018  ? CERVICAL CONIZATION W/BX N/A 11/05/2020  ? Procedure: CONIZATION CERVIX WITH BIOPSY;  Surgeon: Newton Pigg, MD;  Location: [facility];  Service: Gynecology;  Laterality: N/A;  ? CHOLECYSTECTOMY N/A 08/22/2019  ? Procedure: LAPAROSCOPIC CHOLECYSTECTOMY;  Surgeon: [name], [name], MD;  Location: [facility];  Service: General;  Laterality: N/A;  ? ERCP N/A 09/03/2019  ? Procedure: ENDOSCOPIC RETROGRADE CHOLANGIOPANCREATOGRAPHY (ERCP);  Surgeon: Clarene Essex, MD;  Location: [facility];  Service: Endoscopy;  Laterality: N/A;  ? ESOPHAGOGASTRODUODENOSCOPY (EGD) WITH PROPOFOL N/A 12/06/2019  ? Procedure: ESOPHAGOGASTRODUODENOSCOPY (EGD) WITH PROPOFOL;  Surgeon: Clarene Essex, MD;  Location: [facility];  Service: Endoscopy;  Laterality: N/A;  stent removal, ERCP Scope, needs Flouro  ? HYSTEROSCOPY WITH D & C N/A 11/05/2020  ? Procedure: DILATATION AND CURETTAGE /HYSTEROSCOPY WITH MYOSURE;  Surgeon: Newton Pigg, MD;  Location: [facility];  Service: Gynecology;  Laterality: N/A;  ? IR SINUS/FIST TUBE CHK-NON GI  09/10/2019  ? IR SINUS/FIST TUBE CHK-NON GI  09/24/2019  ? KNEE ARTHROSCOPY Left 2002  ? meniscus repair  ? REMOVAL OF STONES  09/03/2019  ? Procedure: REMOVAL OF STONES;  Surgeon: Clarene Essex, MD;  Location: [facility];  Service: Endoscopy;;  ? SPHINCTEROTOMY  09/03/2019  ? Procedure: SPHINCTEROTOMY;  Surgeon: Clarene Essex, MD;  Location: [facility];  Service: Endoscopy;;  ? STENT REMOVAL  12/06/2019  ? Procedure: STENT REMOVAL;  Surgeon: Clarene Essex, MD;  Location: [facility];  Service: Endoscopy;;  ? TUBAL LIGATION Bilateral 1980  ? AND RIGHT BREAST EXCISION CYST  (BENIGN)  ? ? ?OB History   ? ? Gravida  ?2  ? Para  ?   ? Term  ?   ? Preterm  ?   ? AB  ?   ? Living  ?   ?  ? ? SAB  ?   ? IAB  ?   ? Ectopic  ?   ? Multiple  ?   ? Live Births  ?   ?   ?  ?  ? ? ? ?Home Medications   ? ?Prior to Admission medications   ?Medication Sig Start Date End Date Taking? Authorizing Provider  ?amoxicillin-clavulanate (AUGMENTIN) 875-125 MG tablet Take 1 tablet by mouth every 12 (twelve) hours. 02/08/22  Yes Teodora Medici, FNP  ?acetaminophen (TYLENOL) 650 MG CR tablet Take 650-1,300 mg by mouth every 8 (eight) hours as needed for pain.    [provider]  ?alendronate (  FOSAMAX) 10 MG tablet TAKE (1) TABLET BY MOUTH PER WEEK TAKE ON EMPTY STOMACH WITH FULL GLASS OF WATER 09/11/19   [name], MD  ?allopurinol (ZYLOPRIM) 100 MG tablet TAKE 1 TABLET BY MOUTH EVERY DAY 06/29/21   Persons, Bevely Palmer, PA  ?calcium carbonate (TUMS - DOSED IN MG ELEMENTAL CALCIUM) 500 MG chewable tablet Chew 1-2 tablets by mouth at bedtime.    [provider]  ?CALCIUM PO Take 1 tablet by mouth daily.    [provider]  ?Carboxymethylcellul-Glycerin (LUBRICATING EYE DROPS OP) Place 1 drop into both eyes daily as needed (dry eyes).    [provider]  ?chlorpheniramine-HYDROcodone (TUSSIONEX PENNKINETIC ER) 10-8 MG/5ML SUER Take 5 mLs by mouth every 12 (twelve) hours as needed for cough. 09/03/21   Isla Pence, MD  ?cholecalciferol (VITAMIN D3) 25 MCG (1000 UT) tablet Take 1,000 Units by mouth daily.    [provider]  ?clindamycin (CLINDAGEL) 1 % gel APPLY TO AFFECTED AREA TWICE A DAY 04/13/18   [name], MD  ?colchicine 0.6 MG tablet Take 1 tablet (0.6 mg total) by mouth 2 (two) times daily. Take twice daily until pain resolves and then take as needed for gout flare. 06/15/18   [name], MD  ?CVS ANTI-FUNGAL 2 % powder APPLY TO AFFECTED AREA TWICE A DAY AFTER SKIN RESOLVES FROM KETOCONAZOLE CREAM 10/17/14   [name], MD  ?diazepam (VALIUM) 5  MG tablet TAKE 1/2 TABLET (2.'5MG'$  TOTAL) BY MOUTH AT BEDTIME 12/17/20   Paige, Weldon Picking, DO  ?ELIQUIS 5 MG TABS tablet TAKE 1 TABLET BY MOUTH TWICE A DAY 09/06/21   Jerline Pain, MD  ?Flax Oil-Fish Oil-

## 2022-02-08 NOTE — Discharge Instructions (Signed)
You have been prescribed an antibiotic to treat left leg infection.  Please change dressing daily.  Follow-up with primary care doctor for further evaluation and management. ?

## 2022-02-08 NOTE — ED Triage Notes (Signed)
Per pt she was at her house and hit her leg on a box and wanted to make sure she did not do any damage bc of her having lymphedema in her legs. There is a small cut on her left lower leg. No redness or swelling from cut.  ?

## 2022-02-24 ENCOUNTER — Other Ambulatory Visit: Payer: Self-pay | Admitting: Family Medicine

## 2022-02-24 ENCOUNTER — Other Ambulatory Visit: Payer: Self-pay | Admitting: Physician Assistant

## 2022-03-11 ENCOUNTER — Other Ambulatory Visit: Payer: Self-pay | Admitting: Family Medicine

## 2022-03-11 ENCOUNTER — Other Ambulatory Visit: Payer: Self-pay | Admitting: Cardiology

## 2022-03-15 ENCOUNTER — Other Ambulatory Visit: Payer: Self-pay | Admitting: Gastroenterology

## 2022-03-15 DIAGNOSIS — R109 Unspecified abdominal pain: Secondary | ICD-10-CM

## 2022-03-17 ENCOUNTER — Emergency Department (HOSPITAL_BASED_OUTPATIENT_CLINIC_OR_DEPARTMENT_OTHER)
Admission: EM | Admit: 2022-03-17 | Discharge: 2022-03-18 | Disposition: A | Discharge status: Home / Self Care | Payer: Medicare Other | Attending: Emergency Medicine | Admitting: Emergency Medicine

## 2022-03-17 ENCOUNTER — Emergency Department (HOSPITAL_BASED_OUTPATIENT_CLINIC_OR_DEPARTMENT_OTHER): Payer: Medicare Other

## 2022-03-17 ENCOUNTER — Other Ambulatory Visit: Payer: Self-pay

## 2022-03-17 ENCOUNTER — Encounter (HOSPITAL_BASED_OUTPATIENT_CLINIC_OR_DEPARTMENT_OTHER): Payer: Self-pay

## 2022-03-17 DIAGNOSIS — N289 Disorder of kidney and ureter, unspecified: Secondary | ICD-10-CM | POA: Diagnosis not present

## 2022-03-17 DIAGNOSIS — I5033 Acute on chronic diastolic (congestive) heart failure: Secondary | ICD-10-CM | POA: Diagnosis not present

## 2022-03-17 DIAGNOSIS — N390 Urinary tract infection, site not specified: Secondary | ICD-10-CM | POA: Insufficient documentation

## 2022-03-17 DIAGNOSIS — I130 Hypertensive heart and chronic kidney disease with heart failure and stage 1 through stage 4 chronic kidney disease, or unspecified chronic kidney disease: Secondary | ICD-10-CM | POA: Diagnosis not present

## 2022-03-17 DIAGNOSIS — I4891 Unspecified atrial fibrillation: Secondary | ICD-10-CM | POA: Insufficient documentation

## 2022-03-17 DIAGNOSIS — Z7901 Long term (current) use of anticoagulants: Secondary | ICD-10-CM | POA: Insufficient documentation

## 2022-03-17 DIAGNOSIS — R0609 Other forms of dyspnea: Secondary | ICD-10-CM | POA: Diagnosis not present

## 2022-03-17 DIAGNOSIS — R609 Edema, unspecified: Secondary | ICD-10-CM | POA: Insufficient documentation

## 2022-03-17 DIAGNOSIS — N184 Chronic kidney disease, stage 4 (severe): Secondary | ICD-10-CM | POA: Diagnosis not present

## 2022-03-17 DIAGNOSIS — Z79899 Other long term (current) drug therapy: Secondary | ICD-10-CM | POA: Diagnosis not present

## 2022-03-17 DIAGNOSIS — R0602 Shortness of breath: Secondary | ICD-10-CM | POA: Diagnosis present

## 2022-03-17 NOTE — ED Triage Notes (Signed)
Pt reports SOB x 2 weeks, b/l leg/feet swelling and also reports diarrhea with hx of diverticulitis. Denies chest pain. Pt speaking in clear complete sentences at this time. ?

## 2022-03-17 NOTE — ED Provider Notes (Signed)
?[facility] DEPT ?Provider Note ? ?CSN: [id number] ?Arrival date & time: 03/17/22 2320 ? ?Chief Complaint(s) ?Shortness of Breath ? ?HPI ?Kristen [name] is a 81 y.o. female with a past medical history listed below including atrial fibrillation on Eliquis, chronic diastolic heart failure, chronic kidney disease here for worsening peripheral edema and dyspnea on exertion.  She reports that she was previously on 20 mg Lasix 3 times daily but decided to cut back last week because her peripheral edema was not improving and she was having frequency.  Since then she has been having worsening edema and dyspnea on exertion.  She denies any associated chest pain.  No coughing or congestion.  No nausea or vomiting.  Denies any dysuria.  No other physical complaints. ? ?The history is provided by the patient.  ? ?Past Medical History ?Past Medical History:  ?Diagnosis Date  ? Abscess of abdominal cavity ([place]) 10/02/2019  ? Anticoagulant long-term use   ? elquis -- managed by cardiology  ? Anxiety   ? SITUATIONAL IN PAST  ? Atrial fibrillation ([place])   ? Chronic calculous cholecystitis   ? Chronic kidney disease   ? ckd stage 4 per lov dr Corliss Parish 09-28-2020 on chart  ? Chronic venous insufficiency   ? w/  varicose veins right worse than left waers compresiion stocking prn  ? Depression   ? SITUATIONAL IN PAST  ? Diastolic CHF, chronic ([place]) 2010  ? followed by cardiology HX OF 2010 DUE TO FLECANIDE  ? Diverticulosis of colon   ? Dyspnea   ? occ with heavt activity and at bedtime when tired for last few months per pt  ? Dysrhythmia   ? a-fib  ? Edema of both lower extremities   ? per pt mostly in summer time  ? Essential hypertension, benign   ? Fibrocystic breast disease   ? Full dentures   ? GERD (gastroesophageal reflux disease)   ? occasional tums and does not eat prior to bedtime  ? Gout   ? 08-19-2019  per pt last episode DEC 2021 LEFT HAND  ? Hiatal hernia   ? History of diverticulitis  01/22/2016  ? History of recurrent UTIs   ? NONE RECNET SEES DR WRENN  ? Hx of cholecystectomy 10/02/2019  ? Hyperlipidemia   ? Insomnia   ? Migraines   ? OA (osteoarthritis)   ? knees, lower back  ? Permanent atrial fibrillation [facility]) cardiologist--- dr Agustin Cree  ? first dx 10/ 2011--- histroy DCCV 11-25-2010 by dr Wynonia Lawman (pt's previous cardiologist) and Cardiac cath 12-17-2010 no significant disease  ? Pneumonia 08/2019  ? COLLAPSED LEFT LUNG AND PNEUMONIA  ? Scoliosis   ? Stress incontinence in female   ? ?Patient Active Problem List  ? Diagnosis Date Noted  ? Occipital neuralgia of right side 06/10/2021  ? History of lacunar cerebrovascular accident 06/10/2021  ? 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 Ganglion cyst of volar aspect of right wrist 12/07/2020  ? Chronic daily headache 07/03/2020  ? Elevated serum creatinine 05/04/2020  ? Muscle spasms of both lower extremities 05/04/2020  ? Seborrheic keratoses 04/30/2020  ? Skin tag 04/08/2020  ? Fatigue 03/17/2020  ? Unsatisfactory living conditions 10/23/2019  ? Hx of cholecystectomy 10/02/2019  ? Acute renal failure (ARF) ([place]) 08/29/2019  ? A-fib ([place]) 08/29/2019  ? Varicose veins of leg with edema, right 05/21/2019  ? Bunion of great toe of right foot 05/21/2019  ? Osteoarthritis 03/13/2018  ? Gait abnormality 03/13/2018  ?  Flat wart 02/13/2018  ? Situational mixed anxiety and depressive disorder 10/27/2017  ? Abnormal Pap smear of cervix 02/08/2016  ? Chronic kidney disease 02/08/2016  ? Urinary frequency 02/08/2016  ? History of diverticulitis 01/22/2016  ? Anxiety state 10/17/2014  ? Long-term (current) use of anticoagulants 06/10/2014  ? Chronic venous insufficiency 06/10/2014  ? Obesity (BMI 30-39.9)   ? Acute on chronic diastolic CHF (congestive heart failure) ([place])   ? Candidal intertrigo 06/09/2014  ? Insomnia 01/10/2011  ? Permanent atrial fibrillation ([place])   ? OSTEOPENIA 01/31/2008  ? Hypertensive heart disease   ? ?Home  Medication(s) ?Prior to Admission medications   ?Medication Sig Start Date End Date Taking? Authorizing Provider  ?cephALEXin (KEFLEX) 500 MG capsule Take 1 capsule (500 mg total) by mouth 3 (three) times daily for 10 days. 03/18/22 03/28/22 Yes Keneisha Heckart, Grayce Sessions, MD  ?acetaminophen (TYLENOL) 650 MG CR tablet Take 650-1,300 mg by mouth every 8 (eight) hours as needed for pain.    [provider]  ?alendronate (FOSAMAX) 10 MG tablet TAKE (1) TABLET BY MOUTH PER WEEK TAKE ON EMPTY STOMACH WITH FULL GLASS OF WATER 09/11/19   [name], MD  ?allopurinol (ZYLOPRIM) 100 MG tablet TAKE 1 TABLET BY MOUTH EVERY DAY 02/25/22   Persons, Bevely Palmer, PA  ?amoxicillin-clavulanate (AUGMENTIN) 875-125 MG tablet Take 1 tablet by mouth every 12 (twelve) hours. 02/08/22   Teodora Medici, FNP  ?calcium carbonate (TUMS - DOSED IN MG ELEMENTAL CALCIUM) 500 MG chewable tablet Chew 1-2 tablets by mouth at bedtime.    [provider]  ?CALCIUM PO Take 1 tablet by mouth daily.    [provider]  ?Carboxymethylcellul-Glycerin (LUBRICATING EYE DROPS OP) Place 1 drop into both eyes daily as needed (dry eyes).    [provider]  ?chlorpheniramine-HYDROcodone (TUSSIONEX PENNKINETIC ER) 10-8 MG/5ML SUER Take 5 mLs by mouth every 12 (twelve) hours as needed for cough. 09/03/21   Isla Pence, MD  ?cholecalciferol (VITAMIN D3) 25 MCG (1000 UT) tablet Take 1,000 Units by mouth daily.    [provider]  ?clindamycin (CLINDAGEL) 1 % gel APPLY TO AFFECTED AREA TWICE A DAY 04/13/18   [name], MD  ?colchicine 0.6 MG tablet Take 1 tablet (0.6 mg total) by mouth 2 (two) times daily. Take twice daily until pain resolves and then take as needed for gout flare. 06/15/18   [name], MD  ?[employer] ANTI-FUNGAL 2 % powder APPLY TO AFFECTED AREA TWICE A DAY AFTER SKIN RESOLVES FROM KETOCONAZOLE CREAM 10/17/14   [name], MD  ?diazepam (VALIUM) 5 MG tablet TAKE 1/2 TABLET (2.'5MG'$  TOTAL) BY MOUTH AT  BEDTIME 12/17/20   Paige, [place] J, DO  ?ELIQUIS 5 MG TABS tablet TAKE 1 TABLET BY MOUTH TWICE A DAY 09/06/21   Jerline Pain, MD  ?Flax Oil-Fish Oil-Borage Oil (FISH-FLAX-BORAGE) CAPS Take 1 capsule by mouth daily.    [provider]  ?fluticasone (FLONASE) 50 MCG/ACT nasal spray Place 1 spray into both nostrils daily. 09/03/21   Isla Pence, MD  ?furosemide (LASIX) 20 MG tablet TAKE 1 TABLET BY MOUTH EVERY DAY 09/03/21   Jerline Pain, MD  ?Garlic 563 MG TABS Take by mouth. DAILY    [provider]  ?glucosamine-chondroitin 500-400 MG tablet Take 1 tablet by mouth daily.     [provider]  ?HYDROcodone-acetaminophen (NORCO/VICODIN) 5-325 MG tablet Take 1 tablet by mouth every 6 (six) hours as needed for moderate pain. 09/17/19   Alfredia Ferguson,  Omair Latif, DO  ?lisinopril (ZESTRIL) 20 MG tablet TAKE 1 TABLET BY MOUTH EVERYDAY AT BEDTIME ?Patient taking differently: daily. Takes 1/2 tablet daily at bedtime. 06/30/21   Shary Key, DO  ?loratadine (CLARITIN) 10 MG tablet TAKE 1 TABLET BY MOUTH EVERY DAY 03/15/21   Shary Key, DO  ?metoprolol succinate (TOPROL-XL) 100 MG 24 hr tablet TAKE 1 TABLET BY MOUTH EVERY DAY WITH OR IMMEDIATELY FOLLOWING A MEAL. Please call and schedule appt with Dr. Marlou Porch for additional refills [phone]. Thank you. 1st attempt 03/11/22   Jerline Pain, MD  ?Multiple Vitamins-Minerals (EMERGEN-C IMMUNE) PACK Take 1 packet by mouth daily.    [provider]  ?Multiple Vitamins-Minerals (MULTIVITAMIN WOMEN 50+) TABS Take 1 tablet by mouth daily.     [provider]  ?mupirocin ointment (BACTROBAN) 2 % APPLY TOPICALLY TO AFFECTED AREA TWICE DAILY 02/20/19   [name], MD  ?Nutritional Supplements (ECHINACEA/GOLDEN SEAL PO) Take 1 tablet by mouth daily.     [provider]  ?nystatin (MYCOSTATIN/NYSTOP) powder Apply 1 application topically 2 (two) times daily. 04/30/20   Kinnie Feil, MD  ?pantoprazole (PROTONIX) 40  MG tablet Take 1 tablet (40 mg total) by mouth daily. ?Patient not taking: Reported on 01/19/2022 09/18/19   Raiford Noble Latif, DO  ?polycarbophil (FIBERCON) 625 MG tablet Take 625 mg by mouth daily.    Provider, His

## 2022-03-18 DIAGNOSIS — N390 Urinary tract infection, site not specified: Secondary | ICD-10-CM | POA: Diagnosis not present

## 2022-03-18 LAB — CBC WITH DIFFERENTIAL/PLATELET
Abs Immature Granulocytes: 0.06 10*3/uL (ref 0.00–0.07)
Basophils Absolute: 0.1 10*3/uL (ref 0.0–0.1)
Basophils Relative: 1 %
Eosinophils Absolute: 0.3 10*3/uL (ref 0.0–0.5)
Eosinophils Relative: 3 %
HCT: 40 % (ref 36.0–46.0)
Hemoglobin: 12.7 g/dL (ref 12.0–15.0)
Immature Granulocytes: 1 %
Lymphocytes Relative: 27 %
Lymphs Abs: 2.4 10*3/uL (ref 0.7–4.0)
MCH: 30.9 pg (ref 26.0–34.0)
MCHC: 31.8 g/dL (ref 30.0–36.0)
MCV: 97.3 fL (ref 80.0–100.0)
Monocytes Absolute: 1.3 10*3/uL — ABNORMAL HIGH (ref 0.1–1.0)
Monocytes Relative: 14 %
Neutro Abs: 4.7 10*3/uL (ref 1.7–7.7)
Neutrophils Relative %: 54 %
Platelets: 291 10*3/uL (ref 150–400)
RBC: 4.11 MIL/uL (ref 3.87–5.11)
RDW: 13.5 % (ref 11.5–15.5)
WBC: 8.7 10*3/uL (ref 4.0–10.5)
nRBC: 0 % (ref 0.0–0.2)

## 2022-03-18 LAB — COMPREHENSIVE METABOLIC PANEL
ALT: 19 U/L (ref 0–44)
AST: 27 U/L (ref 15–41)
Albumin: 3.6 g/dL (ref 3.5–5.0)
Alkaline Phosphatase: 62 U/L (ref 38–126)
Anion gap: 9 (ref 5–15)
BUN: 33 mg/dL — ABNORMAL HIGH (ref 8–23)
CO2: 30 mmol/L (ref 22–32)
Calcium: 9.6 mg/dL (ref 8.9–10.3)
Chloride: 98 mmol/L (ref 98–111)
Creatinine, Ser: 1.45 mg/dL — ABNORMAL HIGH (ref 0.44–1.00)
GFR, Estimated: 36 mL/min — ABNORMAL LOW (ref >60)
Glucose, Bld: 113 mg/dL — ABNORMAL HIGH (ref 70–99)
Potassium: 4.7 mmol/L (ref 3.5–5.1)
Sodium: 137 mmol/L (ref 135–145)
Total Bilirubin: 0.8 mg/dL (ref 0.3–1.2)
Total Protein: 7 g/dL (ref 6.5–8.1)

## 2022-03-18 LAB — URINALYSIS, ROUTINE W REFLEX MICROSCOPIC
Bilirubin Urine: NEGATIVE
Glucose, UA: NEGATIVE mg/dL
Hgb urine dipstick: NEGATIVE
Ketones, ur: NEGATIVE mg/dL
Nitrite: NEGATIVE
Specific Gravity, Urine: 1.018 (ref 1.005–1.030)
WBC, UA: >50 WBC/hpf — ABNORMAL HIGH (ref 0–5)
pH: 7.5 (ref 5.0–8.0)

## 2022-03-18 LAB — MAGNESIUM: Magnesium: 2.4 mg/dL (ref 1.7–2.4)

## 2022-03-18 LAB — BRAIN NATRIURETIC PEPTIDE: B Natriuretic Peptide: 98.8 pg/mL (ref 0.0–100.0)

## 2022-03-18 MED ORDER — SODIUM CHLORIDE 0.9 % IV SOLN
1.0000 g | Freq: Once | INTRAVENOUS | Status: AC
  Administered 2022-03-18: 1 g via INTRAVENOUS
  Filled 2022-03-18: qty 10

## 2022-03-18 MED ORDER — CEPHALEXIN 500 MG PO CAPS
500.0000 mg | ORAL_CAPSULE | Freq: Three times a day (TID) | ORAL | 0 refills | Status: AC
Start: 2022-03-18 — End: 2022-03-28

## 2022-03-18 MED ORDER — FUROSEMIDE 10 MG/ML IJ SOLN
40.0000 mg | Freq: Once | INTRAMUSCULAR | Status: AC
  Administered 2022-03-18: 40 mg via INTRAVENOUS
  Filled 2022-03-18: qty 4

## 2022-03-18 NOTE — ED Notes (Signed)
Pt wearing adult brief; adult brief and linens saturated in urine  -- linen change/gown change at this time with brief change; well tolerated by pt.  Baseline pulse ox obtained while pt sitting up to bedside 93-94% on RA; pt then ambulated 7f escorted by this nurse- pt using her personal care  -- denies sob/cp/dizziness while ambulating.  Now back in bed resting awaiting updated plan of care.   ?

## 2022-03-18 NOTE — ED Notes (Signed)
Pt assisted with changing into disposable scrubs to wear home after repeat adult brief change as pt has voided in one changed prior to ambulating earlier -- this nurse then verbally reinforced d/c instructions and provided pt with written copy - pt acknowledges verbal understanding and denies any additional questions, concerns, needs- escorted to vehicle via w/c (friend driving pt home)  ?

## 2022-03-18 NOTE — ED Notes (Signed)
Pt given cup of iced water ?

## 2022-03-18 NOTE — ED Notes (Signed)
ED Provider at bedside. 

## 2022-03-31 ENCOUNTER — Other Ambulatory Visit: Payer: Self-pay | Admitting: Family Medicine

## 2022-03-31 ENCOUNTER — Other Ambulatory Visit: Payer: Self-pay | Admitting: Cardiology

## 2022-04-01 ENCOUNTER — Encounter: Payer: Self-pay | Admitting: Emergency Medicine

## 2022-04-01 ENCOUNTER — Ambulatory Visit
Admission: EM | Admit: 2022-04-01 | Discharge: 2022-04-01 | Disposition: A | Discharge status: Home / Self Care | Payer: Medicare Other | Attending: Physician Assistant | Admitting: Physician Assistant

## 2022-04-01 DIAGNOSIS — S81812A Laceration without foreign body, left lower leg, initial encounter: Secondary | ICD-10-CM

## 2022-04-01 NOTE — ED Triage Notes (Signed)
Patient c/o fall that occurred today at 1000.   Patient sustained a fall and a laceration to right lower leg per patient statement.   Patient has area wrapped in gauze upon arrival.   Patient denies LOC or hitting head upon fall.   Patient wants to know " if we can also solve my lymphedema".

## 2022-04-01 NOTE — Discharge Instructions (Signed)
See your Provider for recheck next week

## 2022-04-06 NOTE — ED Provider Notes (Signed)
[facility]    CSN: [id number] Arrival date & time: 04/01/22  1801      History   Chief Complaint Chief Complaint  Patient presents with   Fall   Laceration    HPI Kristen [name] is a 81 y.o. female.   Pt complains of a laceration to her lower leg.  Pt reports she cut her leg.  Pt reports she has lymphedema.  Pt reports wound has been oozing.   The history is provided by the patient. No language interpreter was used.  Fall This is a new problem. The problem occurs constantly. The problem has not changed since onset.Nothing aggravates the symptoms. Nothing relieves the symptoms. The treatment provided no relief.  Laceration  Past Medical History:  Diagnosis Date   Abscess of abdominal cavity ([place]) 10/02/2019   Anticoagulant long-term use    elquis -- managed by cardiology   Anxiety    SITUATIONAL IN PAST   Atrial fibrillation ([place])    Chronic calculous cholecystitis    Chronic kidney disease    ckd stage 4 per lov dr Corliss Parish 09-28-2020 on chart   Chronic venous insufficiency    w/  varicose veins right worse than left waers compresiion stocking prn   Depression    SITUATIONAL IN PAST   Diastolic CHF, chronic ([place]) 2010   followed by cardiology HX OF 2010 DUE TO FLECANIDE   Diverticulosis of colon    Dyspnea    occ with heavt activity and at bedtime when tired for last few months per pt   Dysrhythmia    a-fib   Edema of both lower extremities    per pt mostly in summer time   Essential hypertension, benign    Fibrocystic breast disease    Full dentures    GERD (gastroesophageal reflux disease)    occasional tums and does not eat prior to bedtime   Gout    08-19-2019  per pt last episode DEC 2021 LEFT HAND   Hiatal hernia    History of diverticulitis 01/22/2016   History of recurrent UTIs    NONE RECNET SEES DR WRENN   Hx of cholecystectomy 10/02/2019   Hyperlipidemia    Insomnia    Migraines    OA (osteoarthritis)    knees,  lower back   Permanent atrial fibrillation [facility]) cardiologist--- dr Agustin Cree   first dx 10/ 2011--- histroy DCCV 11-25-2010 by dr Wynonia Lawman (pt's previous cardiologist) and Cardiac cath 12-17-2010 no significant disease   Pneumonia 08/2019   COLLAPSED LEFT LUNG AND PNEUMONIA   Scoliosis    Stress incontinence in female     Patient Active Problem List   Diagnosis Date Noted   Occipital neuralgia of right side 06/10/2021   History of lacunar cerebrovascular accident 06/10/2021   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/2021   Ganglion cyst of volar aspect of right wrist 12/07/2020   Chronic daily headache 07/03/2020   Elevated serum creatinine 05/04/2020   Muscle spasms of both lower extremities 05/04/2020   Seborrheic keratoses 04/30/2020   Skin tag 04/08/2020   Fatigue 03/17/2020   Unsatisfactory living conditions 10/23/2019   Hx of cholecystectomy 10/02/2019   Acute renal failure (ARF) ([place]) 08/29/2019   A-fib ([place]) 08/29/2019   Varicose veins of leg with edema, right 05/21/2019   Bunion of great toe of right foot 05/21/2019   Osteoarthritis 03/13/2018   Gait abnormality 03/13/2018   Flat wart 02/13/2018   Situational mixed anxiety and depressive disorder 10/27/2017  Abnormal Pap smear of cervix 02/08/2016   Chronic kidney disease 02/08/2016   Urinary frequency 02/08/2016   History of diverticulitis 01/22/2016   Anxiety state 10/17/2014   Long-term (current) use of anticoagulants 06/10/2014   Chronic venous insufficiency 06/10/2014   Obesity (BMI 30-39.9)    Acute on chronic diastolic CHF (congestive heart failure) ([facility])    Candidal intertrigo 06/09/2014   Insomnia 01/10/2011   Permanent atrial fibrillation ([place])    OSTEOPENIA 01/31/2008   Hypertensive heart disease     Past Surgical History:  Procedure Laterality Date   BILIARY STENT PLACEMENT N/A 09/03/2019   Procedure: BILIARY STENT PLACEMENT;  Surgeon: Clarene Essex, MD;  Location: [facility];  Service:  Endoscopy;  Laterality: N/A;   BLEPHAROPLASTY Bilateral 02-22-2010  dr Georgia Lopes   upper eyelid's   both knees cortisone injection  08/18/2020   dr Noemi Chapel   BUNIONECTOMY Right 2003   CATARACT EXTRACTION W/ INTRAOCULAR LENS  IMPLANT, BILATERAL  2018   CERVICAL CONIZATION W/BX N/A 11/05/2020   Procedure: CONIZATION CERVIX WITH BIOPSY;  Surgeon: Newton Pigg, MD;  Location: [facility];  Service: Gynecology;  Laterality: N/A;   CHOLECYSTECTOMY N/A 08/22/2019   Procedure: LAPAROSCOPIC CHOLECYSTECTOMY;  Surgeon: [name], [name], MD;  Location: [facility];  Service: General;  Laterality: N/A;   ERCP N/A 09/03/2019   Procedure: ENDOSCOPIC RETROGRADE CHOLANGIOPANCREATOGRAPHY (ERCP);  Surgeon: Clarene Essex, MD;  Location: [facility];  Service: Endoscopy;  Laterality: N/A;   ESOPHAGOGASTRODUODENOSCOPY (EGD) WITH PROPOFOL N/A 12/06/2019   Procedure: ESOPHAGOGASTRODUODENOSCOPY (EGD) WITH PROPOFOL;  Surgeon: Clarene Essex, MD;  Location: [facility];  Service: Endoscopy;  Laterality: N/A;  stent removal, ERCP Scope, needs Flouro   HYSTEROSCOPY WITH D & C N/A 11/05/2020   Procedure: DILATATION AND CURETTAGE /HYSTEROSCOPY WITH MYOSURE;  Surgeon: Newton Pigg, MD;  Location: [facility];  Service: Gynecology;  Laterality: N/A;   IR SINUS/FIST TUBE CHK-NON GI  09/10/2019   IR SINUS/FIST TUBE CHK-NON GI  09/24/2019   KNEE ARTHROSCOPY Left 2002   meniscus repair   REMOVAL OF STONES  09/03/2019   Procedure: REMOVAL OF STONES;  Surgeon: Clarene Essex, MD;  Location: [facility];  Service: Endoscopy;;   SPHINCTEROTOMY  09/03/2019   Procedure: Joan Mayans;  Surgeon: Clarene Essex, MD;  Location: [facility];  Service: Endoscopy;;   STENT REMOVAL  12/06/2019   Procedure: STENT REMOVAL;  Surgeon: Clarene Essex, MD;  Location: [facility];  Service: Endoscopy;;   TUBAL LIGATION Bilateral 1980   AND RIGHT BREAST EXCISION CYST (BENIGN)    OB History      Gravida  2   Para      Term      Preterm      AB      Living         SAB      IAB      Ectopic      Multiple      Live Births               Home Medications    Prior to Admission medications   Medication Sig Start Date End Date Taking? Authorizing Provider  acetaminophen (TYLENOL) 650 MG CR tablet Take 650-1,300 mg by mouth every 8 (eight) hours as needed for pain.    [provider]  alendronate (FOSAMAX) 10 MG tablet TAKE (1) TABLET BY MOUTH PER WEEK TAKE ON EMPTY STOMACH WITH FULL GLASS OF WATER 09/11/19   [name], MD  allopurinol [facility])  100 MG tablet TAKE 1 TABLET BY MOUTH EVERY DAY 02/25/22   Persons, Bevely Palmer, PA  amoxicillin-clavulanate (AUGMENTIN) 875-125 MG tablet Take 1 tablet by mouth every 12 (twelve) hours. 02/08/22   Teodora Medici, FNP  calcium carbonate (TUMS - DOSED IN MG ELEMENTAL CALCIUM) 500 MG chewable tablet Chew 1-2 tablets by mouth at bedtime.    [provider]  CALCIUM PO Take 1 tablet by mouth daily.    [provider]  Carboxymethylcellul-Glycerin (LUBRICATING EYE DROPS OP) Place 1 drop into both eyes daily as needed (dry eyes).    [provider]  chlorpheniramine-HYDROcodone (TUSSIONEX PENNKINETIC ER) 10-8 MG/5ML SUER Take 5 mLs by mouth every 12 (twelve) hours as needed for cough. 09/03/21   Isla Pence, MD  cholecalciferol (VITAMIN D3) 25 MCG (1000 UT) tablet Take 1,000 Units by mouth daily.    [provider]  clindamycin (CLINDAGEL) 1 % gel APPLY TO AFFECTED AREA TWICE A DAY 04/13/18   [name], MD  colchicine 0.6 MG tablet Take 1 tablet (0.6 mg total) by mouth 2 (two) times daily. Take twice daily until pain resolves and then take as needed for gout flare. 06/15/18   [name], MD  [employer] ANTI-FUNGAL 2 % powder APPLY TO AFFECTED AREA TWICE A DAY AFTER SKIN RESOLVES FROM KETOCONAZOLE CREAM 10/17/14   [name], MD  diazepam (VALIUM) 5 MG tablet TAKE 1/2 TABLET (2.'5MG'$   TOTAL) BY MOUTH AT BEDTIME 12/17/20   Paige, [place] J, DO  ELIQUIS 5 MG TABS tablet TAKE 1 TABLET BY MOUTH TWICE A DAY 09/06/21   Jerline Pain, MD  Flax Oil-Fish Oil-Borage Oil (FISH-FLAX-BORAGE) CAPS Take 1 capsule by mouth daily.    [provider]  fluticasone (FLONASE) 50 MCG/ACT nasal spray Place 1 spray into both nostrils daily. 09/03/21   Isla Pence, MD  furosemide (LASIX) 20 MG tablet TAKE 1 TABLET BY MOUTH EVERY DAY 03/31/22   Jerline Pain, MD  Garlic 706 MG TABS Take by mouth. DAILY    [provider]  glucosamine-chondroitin 500-400 MG tablet Take 1 tablet by mouth daily.     [provider]  HYDROcodone-acetaminophen (NORCO/VICODIN) 5-325 MG tablet Take 1 tablet by mouth every 6 (six) hours as needed for moderate pain. 09/17/19   Sheikh, Omair Latif, DO  lisinopril (ZESTRIL) 20 MG tablet TAKE 1 TABLET BY MOUTH EVERYDAY AT BEDTIME Patient taking differently: daily. Takes 1/2 tablet daily at bedtime. 06/30/21   Shary Key, DO  loratadine (CLARITIN) 10 MG tablet TAKE 1 TABLET BY MOUTH EVERY DAY 03/15/21   Shary Key, DO  metoprolol succinate (TOPROL-XL) 100 MG 24 hr tablet TAKE 1 TABLET BY MOUTH EVERY DAY WITH OR IMMEDIATELY FOLLOWING A MEAL. Please call and schedule appt with Dr. Marlou Porch for additional refills [phone]. Thank you. 1st attempt 03/11/22   Jerline Pain, MD  Multiple Vitamins-Minerals (EMERGEN-C IMMUNE) PACK Take 1 packet by mouth daily.    [provider]  Multiple Vitamins-Minerals (MULTIVITAMIN WOMEN 50+) TABS Take 1 tablet by mouth daily.     [provider]  mupirocin ointment (BACTROBAN) 2 % APPLY TOPICALLY TO AFFECTED AREA TWICE DAILY 02/20/19   [name], MD  Nutritional Supplements (ECHINACEA/GOLDEN SEAL PO) Take 1 tablet by mouth daily.     [provider]  nystatin (MYCOSTATIN/NYSTOP) powder Apply 1 application topically 2 (two) times daily. 04/30/20   Kinnie Feil, MD   pantoprazole (PROTONIX) 40 MG tablet Take 1 tablet (  40 mg total) by mouth daily. Patient not taking: Reported on 01/19/2022 09/18/19   Raiford Noble Latif, DO  polycarbophil (FIBERCON) 625 MG tablet Take 625 mg by mouth daily.    [provider]  potassium chloride (KLOR-CON) 20 MEQ packet USE 1 PACKET BY MOUTH EVERY DAY 08/11/20   Shary Key, DO  Probiotic Product (PROBIOTIC-10 PO) Take 1 tablet by mouth daily.     [provider]  Propylene Glycol (SYSTANE COMPLETE) 0.6 % SOLN Place 1 drop into both eyes 4 (four) times daily as needed.    [provider]  RESTASIS 0.05 % ophthalmic emulsion INSTILL 1 DROP INTO BOTH EYES TWICE A DAY Patient not taking: Reported on 01/19/2022 06/01/20   Shary Key, DO  rosuvastatin (CRESTOR) 5 MG tablet Take 5 mg by mouth at bedtime. 03/29/21   [provider]  triamcinolone cream (KENALOG) 0.1 % APPLY TO AFFECTED AREA TWICE A DAY 06/30/21   Paige, Victoria J, DO  Turmeric (QC TUMERIC COMPLEX PO) Take by mouth. WITH CONDIN DAILY    [provider]    Family History Family History  Problem Relation Age of Onset   Cancer Mother        ovarian   Kidney disease Father    Alcohol abuse Father    Cancer Maternal Aunt        lung- smoker   Heart disease Maternal Uncle     Social History Social History   Tobacco Use   Smoking status: Never   Smokeless tobacco: Never  Vaping Use   Vaping Use: Never used  Substance Use Topics   Alcohol use: Not Currently   Drug use: Never     Allergies   Flecainide acetate   Review of Systems Review of Systems  All other systems reviewed and are negative.   Physical Exam Triage Vital Signs ED Triage Vitals  Enc Vitals Group     BP 04/01/22 1845 97/66     Pulse Rate 04/01/22 1841 75     Resp 04/01/22 1841 16     Temp 04/01/22 1841 98.1 F (36.7 C)     Temp Source 04/01/22 1841 Oral     SpO2 04/01/22 1841 93 %     Weight --      Height --       Head Circumference --      Peak Flow --      Pain Score 04/01/22 1929 0     Pain Loc --      Pain Edu? --      Excl. in [place]? --    No data found.  Updated Vital Signs BP 97/66 (BP Location: Left Arm)   Pulse 75   Temp 98.1 F (36.7 C) (Oral)   Resp 16   SpO2 93%   Visual Acuity Right Eye Distance:   Left Eye Distance:   Bilateral Distance:    Right Eye Near:   Left Eye Near:    Bilateral Near:     Physical Exam Vitals reviewed.  Constitutional:      Appearance: Normal appearance.  Cardiovascular:     Rate and Rhythm: Normal rate.  Pulmonary:     Effort: Pulmonary effort is normal.  Musculoskeletal:        General: Tenderness present.     Comments: 4cm laceration right lower leg,  nv and ns intact  skin tear.   Skin:    General: Skin is warm.  Neurological:  General: No focal deficit present.     Mental Status: She is alert.  Psychiatric:        Mood and Affect: Mood normal.     UC Treatments / Results  Labs (all labs ordered are listed, but only abnormal results are displayed) Labs Reviewed - No data to display  EKG   Radiology No results found.  Procedures Procedures (including critical care time)  Medications Ordered in UC Medications - No data to display  Initial Impression / Assessment and Plan / UC Course  I have reviewed the triage vital signs and the nursing notes.  Pertinent labs & imaging results that were available during my care of the patient were reviewed by me and considered in my medical decision making (see chart for details).     Steristrips and bandage to wound  Final Clinical Impressions(s) / UC Diagnoses   Final diagnoses:  Skin tear of left lower leg without complication, initial encounter     Discharge Instructions      See your Provider for recheck next week    ED Prescriptions   None    PDMP not reviewed this encounter.   Fransico Meadow, [place] 04/06/22 1719

## 2022-04-07 ENCOUNTER — Ambulatory Visit
Admission: RE | Admit: 2022-04-07 | Discharge: 2022-04-07 | Disposition: A | Discharge status: Home / Self Care | Payer: Medicare Other | Source: Ambulatory Visit | Attending: Gastroenterology | Admitting: Gastroenterology

## 2022-04-07 DIAGNOSIS — R109 Unspecified abdominal pain: Secondary | ICD-10-CM

## 2022-04-07 MED ORDER — IOPAMIDOL (ISOVUE-300) INJECTION 61%
80.0000 mL | Freq: Once | INTRAVENOUS | Status: AC | PRN
  Administered 2022-04-07: 80 mL via INTRAVENOUS

## 2022-04-08 ENCOUNTER — Other Ambulatory Visit: Payer: Self-pay | Admitting: Student

## 2022-04-08 DIAGNOSIS — E2839 Other primary ovarian failure: Secondary | ICD-10-CM

## 2022-04-13 ENCOUNTER — Encounter (HOSPITAL_BASED_OUTPATIENT_CLINIC_OR_DEPARTMENT_OTHER): Payer: Medicare Other | Attending: Internal Medicine | Admitting: Internal Medicine

## 2022-04-13 DIAGNOSIS — W19XXXA Unspecified fall, initial encounter: Secondary | ICD-10-CM | POA: Insufficient documentation

## 2022-04-13 DIAGNOSIS — Z7901 Long term (current) use of anticoagulants: Secondary | ICD-10-CM | POA: Insufficient documentation

## 2022-04-13 DIAGNOSIS — S80811A Abrasion, right lower leg, initial encounter: Secondary | ICD-10-CM | POA: Insufficient documentation

## 2022-04-13 DIAGNOSIS — I87323 Chronic venous hypertension (idiopathic) with inflammation of bilateral lower extremity: Secondary | ICD-10-CM | POA: Diagnosis not present

## 2022-04-13 DIAGNOSIS — I4891 Unspecified atrial fibrillation: Secondary | ICD-10-CM | POA: Diagnosis not present

## 2022-04-13 DIAGNOSIS — L97818 Non-pressure chronic ulcer of other part of right lower leg with other specified severity: Secondary | ICD-10-CM | POA: Diagnosis present

## 2022-04-13 DIAGNOSIS — I890 Lymphedema, not elsewhere classified: Secondary | ICD-10-CM | POA: Diagnosis not present

## 2022-04-14 ENCOUNTER — Telehealth: Payer: Self-pay

## 2022-04-14 NOTE — Telephone Encounter (Signed)
Pt called stating that she had lost the brochure for the leg elevation wedge discussed in the office and was interested in getting one. Two identifiers used. Pt informed that we would mail a brochure to her. Got preferred address and mailed today. Pt confirmed understanding.

## 2022-04-18 ENCOUNTER — Telehealth: Payer: Self-pay

## 2022-04-18 DIAGNOSIS — M7989 Other specified soft tissue disorders: Secondary | ICD-10-CM

## 2022-04-18 NOTE — Telephone Encounter (Signed)
Returned pt's call, two identifiers used.  Pt stated that she is going to a wound care specialist tomorrow (6/13) for a treatment on her R leg. The provider there said she must have a stocking on her L leg when she comes back. Pt called to see about getting fitted for that today. Alerted nursing staff that pt will arrive no later than 3 PM to be fitted.

## 2022-04-20 ENCOUNTER — Encounter (HOSPITAL_BASED_OUTPATIENT_CLINIC_OR_DEPARTMENT_OTHER): Payer: Medicare Other | Admitting: Physician Assistant

## 2022-04-20 DIAGNOSIS — L97818 Non-pressure chronic ulcer of other part of right lower leg with other specified severity: Secondary | ICD-10-CM | POA: Diagnosis not present

## 2022-04-20 NOTE — Progress Notes (Addendum)
CIERRAH, DACE ([id number]) Visit Report for 04/20/2022 Chief Complaint Document Details Patient Name: Date of Service: Kristen [name], Kristen [name] 04/20/2022 9:00 A M Medical Record Number: [id number] Patient Account Number: [id number] Date of Birth/Sex: Treating RN: May 16, 1941 (81 y.o. Kristen [name] Primary Care Provider: Cipriano Mile Other Clinician: Referring Provider: Treating Provider/Extender: Jolyne Loa Weeks in Treatment: 1 Information Obtained from: Patient Chief Complaint 04/13/2022; patient is here for review of a wound on her right lateral lower leg Electronic Signature(s) Signed: 04/20/2022 9:34:22 AM By: Worthy Keeler PA-C Entered By: Worthy Keeler on 04/20/2022 09:34:22 -------------------------------------------------------------------------------- Debridement Details Patient Name: Date of Service: Kristen [name] 04/20/2022 9:00 A M Medical Record Number: [id number] Patient Account Number: [id number] Date of Birth/Sex: Treating RN: [birth year] (81 y.o. Kristen [name], Kristen [name] Primary Care Provider: Cipriano Mile Other Clinician: Referring Provider: Treating Provider/Extender: Jolyne Loa Weeks in Treatment: 1 Debridement Performed for Assessment: Wound #1 Right,Lateral Lower Leg Performed By: Physician Worthy Keeler, PA Debridement Type: Debridement Level of Consciousness (Pre-procedure): Awake and Alert Pre-procedure Verification/Time Out Yes - 10:08 Taken: Start Time: 10:09 Pain Control: Lidocaine 4% T opical Solution T Area Debrided (L x W): otal 4 (cm) x 2.8 (cm) = 11.2 (cm) Tissue and other material debrided: Viable, Non-Viable, Slough, Subcutaneous, Biofilm, Slough Level: Skin/Subcutaneous Tissue Debridement Description: Excisional Instrument: Curette Bleeding: Minimum Hemostasis Achieved: Pressure End Time: 10:14 Procedural Pain: 0 Post Procedural Pain: 0 Response to Treatment: Procedure was tolerated well Level of  Consciousness (Post- Awake and Alert procedure): Post Debridement Measurements of Total Wound Length: (cm) 4 Width: (cm) 2.8 Depth: (cm) 0.1 Volume: (cm) 0.88 Character of Wound/Ulcer Post Debridement: Improved Post Procedure Diagnosis Same as Pre-procedure Electronic Signature(s) Signed: 04/20/2022 5:11:12 PM By: Worthy Keeler PA-C Signed: 04/20/2022 5:24:59 PM By: Deon Pilling RN, BSN Entered By: Deon Pilling on 04/20/2022 10:14:35 -------------------------------------------------------------------------------- HPI Details Patient Name: Date of Service: Kristen [name] 04/20/2022 9:00 A M Medical Record Number: [id number] Patient Account Number: [id number] Date of Birth/Sex: Treating RN: September 05, 1941 (81 y.o. Kristen [name] Primary Care Provider: Cipriano Mile Other Clinician: Referring Provider: Treating Provider/Extender: Jolyne Loa Weeks in Treatment: 1 History of Present Illness HPI Description: ADMISSION 04/13/2022 This is an 81 year old woman who lives on her own. She has a longstanding history of edema in her legs saw Dr. Doren Custard of vein and vascular on 01/09/2022. He diagnosed her with chronic venous insufficiency and lymphedema prescribed stockings. She did have venous studies that showed reflux in the right common femoral vein. The great saphenous vein was unable to be visualized no evidence of DVT or SVT For 1 reason or another the patient did not get the stockings. Unfortunately last week she suffered a fall and had a fairly clear deep skin tear on the right lateral lower leg. She is here for our review of this. She was put on clindamycin last week I think by her primary doctor and referred here. Past medical history includes hyperlipidemia, lower extremity edema, hiatal hernia, atrial fib on Eliquis, ankle fracture, diastolic congestive heart failure, chronic kidney disease stage III, gout, scoliosis ABIs in our clinic were noncompressible  bilaterally. Readmission: 04-20-2022 upon evaluation today patient appears to be doing well currently in regard to her wound in my opinion. She is actually showing signs of significant improvement which is great news. Fortunately I do not see any evidence of active infection locally or systemically at this time. No fevers, chills, nausea, vomiting, or diarrhea.  Electronic Signature(s) Signed: 04/20/2022 10:23:16 AM By: Worthy Keeler PA-C Entered By: Worthy Keeler on 04/20/2022 10:23:15 -------------------------------------------------------------------------------- Physical Exam Details Patient Name: Date of Service: Kristen [name], Kristen [name] 04/20/2022 9:00 A M Medical Record Number: [id number] Patient Account Number: [id number] Date of Birth/Sex: Treating RN: Jan 17, 1941 (81 y.o. Kristen [name] Primary Care Provider: Cipriano Mile Other Clinician: Referring Provider: Treating Provider/Extender: Jolyne Loa Weeks in Treatment: 1 Constitutional Well-nourished and well-hydrated in no acute distress. Respiratory normal breathing without difficulty. Psychiatric this patient is able to make decisions and demonstrates good insight into disease process. Alert and Oriented x 3. pleasant and cooperative. Notes Patient's wound bed showed evidence of better improvement the Iodoflex did seem to loosen up a lot of the necrotic tissue I was able to carefully debride this away with the curette today without causing too much discomfort she did have quite a bit of pain already in the Iodoflex been causing a lot of problems for her. For that reason after I did this carefully and was able to clear this away the wound actually looks much better I think we can probably switch over to [facility] dressing. Electronic Signature(s) Signed: 04/20/2022 10:23:45 AM By: Worthy Keeler PA-C Entered By: Worthy Keeler on 04/20/2022  10:23:45 -------------------------------------------------------------------------------- Physician Orders Details Patient Name: Date of Service: Kristen [name] 04/20/2022 9:00 A M Medical Record Number: [id number] Patient Account Number: [id number] Date of Birth/Sex: Treating RN: 1941-10-21 (81 y.o. Kristen [name], Kristen [name] Primary Care Provider: Cipriano Mile Other Clinician: Referring Provider: Treating Provider/Extender: Jolyne Loa Weeks in Treatment: 1 Verbal / Phone Orders: No Diagnosis Coding ICD-10 Coding Code Description S80.811D Abrasion, right lower leg, subsequent encounter L97.818 Non-pressure chronic ulcer of other part of right lower leg with other specified severity I89.0 Lymphedema, not elsewhere classified I87.323 Chronic venous hypertension (idiopathic) with inflammation of bilateral lower extremity Follow-up Appointments ppointment in 1 week. [name], [place] and [place], Room 8 Wednesday 04/27/2022 130PM Return [place], [place] and [place], Room 8 Wednesday 05/04/2022 130PM Bathing/ Shower/ Hygiene May shower with protection but do not get wound dressing(s) wet. Edema Control - Lymphedema / SCD / Other Elevate legs to the level of the heart or above for 30 minutes daily and/or when sitting, a frequency of: - 3-4 times a day throughout the day. Avoid standing for long periods of time. Exercise regularly Moisturize legs daily. - left every night before bed. Compression stocking or Garment 30-40 mm/Hg pressure to: - wear to left leg apply in the morning and remove at night. Follow up with Dr. Scot Dock for the stockings he ordered for you. Wound Treatment Wound #1 - Lower Leg Wound Laterality: Right, Lateral Cleanser: Soap and Water 1 x Per Week/30 Days Discharge Instructions: May shower and wash wound with dial antibacterial soap and water prior to dressing change. Cleanser: Wound Cleanser 1 x Per Week/30 Days Discharge Instructions: Cleanse the wound  with wound cleanser prior to applying a clean dressing using gauze sponges, not tissue or cotton balls. Peri-Wound Care: Sween Lotion (Moisturizing lotion) 1 x Per Week/30 Days Discharge Instructions: Apply moisturizing lotion as directed Topical: Gentamicin 1 x Per Week/30 Days Discharge Instructions: APPLY UNDER THE HYDROFERA BLUE. Prim Dressing: Hydrofera Blue Ready Foam, 4x5 in 1 x Per Week/30 Days ary Discharge Instructions: Apply to wound bed as instructed Secondary Dressing: ABD Pad, 8x10 1 x Per Week/30 Days Discharge Instructions: Apply over primary dressing as directed. Secondary Dressing: Woven Gauze Sponge,  Non-Sterile 4x4 in 1 x Per Week/30 Days Discharge Instructions: Apply over primary dressing as directed. Compression Wrap: ThreePress (3 layer compression wrap) 1 x Per Week/30 Days Discharge Instructions: Apply three layer compression ***ENSURE TO WRAP AT THE BASE OF THE TOES.*** Electronic Signature(s) Signed: 04/20/2022 5:11:12 PM By: Worthy Keeler PA-C Signed: 04/20/2022 5:24:59 PM By: Deon Pilling RN, BSN Entered By: Deon Pilling on 04/20/2022 10:17:18 -------------------------------------------------------------------------------- Problem List Details Patient Name: Date of Service: Kristen [name] 04/20/2022 9:00 A M Medical Record Number: [id number] Patient Account Number: [id number] Date of Birth/Sex: Treating RN: 1941-09-12 (81 y.o. Kristen [name] Primary Care Provider: Cipriano Mile Other Clinician: Referring Provider: Treating Provider/Extender: Jolyne Loa Weeks in Treatment: 1 Active Problems ICD-10 Encounter Code Description Active Date MDM Diagnosis S80.811D Abrasion, right lower leg, subsequent encounter 04/13/2022 No Yes L97.818 Non-pressure chronic ulcer of other part of right lower leg with other specified 04/13/2022 No Yes severity I89.0 Lymphedema, not elsewhere classified 04/13/2022 No Yes I87.323 Chronic venous  hypertension (idiopathic) with inflammation of bilateral lower 04/13/2022 No Yes extremity Inactive Problems Resolved Problems Electronic Signature(s) Signed: 04/20/2022 9:34:13 AM By: Worthy Keeler PA-C Entered By: Worthy Keeler on 04/20/2022 09:34:13 -------------------------------------------------------------------------------- Progress Note Details Patient Name: Date of Service: Kristen [name] 04/20/2022 9:00 A M Medical Record Number: [id number] Patient Account Number: [id number] Date of Birth/Sex: Treating RN: 11/04/1941 (81 y.o. Kristen [name] Primary Care Provider: Cipriano Mile Other Clinician: Referring Provider: Treating Provider/Extender: Jolyne Loa Weeks in Treatment: 1 Subjective Chief Complaint Information obtained from Patient 04/13/2022; patient is here for review of a wound on her right lateral lower leg History of Present Illness (HPI) ADMISSION 04/13/2022 This is an 81 year old woman who lives on her own. She has a longstanding history of edema in her legs saw Dr. Doren Custard of vein and vascular on 01/09/2022. He diagnosed her with chronic venous insufficiency and lymphedema prescribed stockings. She did have venous studies that showed reflux in the right common femoral vein. The great saphenous vein was unable to be visualized no evidence of DVT or SVT For 1 reason or another the patient did not get the stockings. Unfortunately last week she suffered a fall and had a fairly clear deep skin tear on the right lateral lower leg. She is here for our review of this. She was put on clindamycin last week I think by her primary doctor and referred here. Past medical history includes hyperlipidemia, lower extremity edema, hiatal hernia, atrial fib on Eliquis, ankle fracture, diastolic congestive heart failure, chronic kidney disease stage III, gout, scoliosis ABIs in our clinic were noncompressible bilaterally. Readmission: 04-20-2022 upon evaluation today  patient appears to be doing well currently in regard to her wound in my opinion. She is actually showing signs of significant improvement which is great news. Fortunately I do not see any evidence of active infection locally or systemically at this time. No fevers, chills, nausea, vomiting, or diarrhea. Objective Constitutional Well-nourished and well-hydrated in no acute distress. Vitals Time Taken: 9:35 AM, Temperature: 98.2 F, Pulse: 128 bpm, Respiratory Rate: 20 breaths/min, Blood Pressure: 123/78 mmHg. Respiratory normal breathing without difficulty. Psychiatric this patient is able to make decisions and demonstrates good insight into disease process. Alert and Oriented x 3. pleasant and cooperative. General Notes: Patient's wound bed showed evidence of better improvement the Iodoflex did seem to loosen up a lot of the necrotic tissue I was able to carefully debride this away with the curette today  without causing too much discomfort she did have quite a bit of pain already in the Iodoflex been causing a lot of problems for her. For that reason after I did this carefully and was able to clear this away the wound actually looks much better I think we can probably switch over to [facility] dressing. Integumentary (Hair, Skin) Wound #1 status is Open. Original cause of wound was Laceration. The date acquired was: 04/06/2022. The wound has been in treatment 1 weeks. The wound is located on the Right,Lateral Lower Leg. The wound measures 4cm length x 2.8cm width x 0.1cm depth; 8.796cm^2 area and 0.88cm^3 volume. There is Fat Layer (Subcutaneous Tissue) exposed. There is no tunneling or undermining noted. There is a medium amount of serosanguineous drainage noted. The wound margin is distinct with the outline attached to the wound base. There is small (1-33%) red granulation within the wound bed. There is a large (67-100%) amount of necrotic tissue within the wound bed including Adherent  Slough. Assessment Active Problems ICD-10 Abrasion, right lower leg, subsequent encounter Non-pressure chronic ulcer of other part of right lower leg with other specified severity Lymphedema, not elsewhere classified Chronic venous hypertension (idiopathic) with inflammation of bilateral lower extremity Procedures Wound #1 Pre-procedure diagnosis of Wound #1 is an Abrasion located on the Right,Lateral Lower Leg . There was a Excisional Skin/Subcutaneous Tissue Debridement with a total area of 11.2 sq cm performed by Worthy Keeler, PA. With the following instrument(s): Curette to remove Viable and Non-Viable tissue/material. Material removed includes Subcutaneous Tissue, Slough, and Biofilm after achieving pain control using Lidocaine 4% T opical Solution. A time out was conducted at 10:08, prior to the start of the procedure. A Minimum amount of bleeding was controlled with Pressure. The procedure was tolerated well with a pain level of 0 throughout and a pain level of 0 following the procedure. Post Debridement Measurements: 4cm length x 2.8cm width x 0.1cm depth; 0.88cm^3 volume. Character of Wound/Ulcer Post Debridement is improved. Post procedure Diagnosis Wound #1: Same as Pre-Procedure Pre-procedure diagnosis of Wound #1 is an Abrasion located on the Right,Lateral Lower Leg . There was a Three Layer Compression Therapy Procedure by Deon Pilling, RN. Post procedure Diagnosis Wound #1: Same as Pre-Procedure Plan Follow-up Appointments: Return Appointment in 1 week. [name], PA and [place], Room 8 Wednesday 04/27/2022 130PM [name], [place] and [place], Room 8 Wednesday 05/04/2022 130PM Bathing/ Shower/ Hygiene: May shower with protection but do not get wound dressing(s) wet. Edema Control - Lymphedema / SCD / Other: Elevate legs to the level of the heart or above for 30 minutes daily and/or when sitting, a frequency of: - 3-4 times a day throughout the day. Avoid standing for long  periods of time. Exercise regularly Moisturize legs daily. - left every night before bed. Compression stocking or Garment 30-40 mm/Hg pressure to: - wear to left leg apply in the morning and remove at night. Follow up with Dr. Scot Dock for the stockings he ordered for you. WOUND #1: - Lower Leg Wound Laterality: Right, Lateral Cleanser: Soap and Water 1 x Per Week/30 Days Discharge Instructions: May shower and wash wound with dial antibacterial soap and water prior to dressing change. Cleanser: Wound Cleanser 1 x Per Week/30 Days Discharge Instructions: Cleanse the wound with wound cleanser prior to applying a clean dressing using gauze sponges, not tissue or cotton balls. Peri-Wound Care: Sween Lotion (Moisturizing lotion) 1 x Per Week/30 Days Discharge Instructions: Apply moisturizing lotion as directed Topical:  Gentamicin 1 x Per Week/30 Days Discharge Instructions: APPLY UNDER THE HYDROFERA BLUE. Prim Dressing: Hydrofera Blue Ready Foam, 4x5 in 1 x Per Week/30 Days ary Discharge Instructions: Apply to wound bed as instructed Secondary Dressing: ABD Pad, 8x10 1 x Per Week/30 Days Discharge Instructions: Apply over primary dressing as directed. Secondary Dressing: Woven Gauze Sponge, Non-Sterile 4x4 in 1 x Per Week/30 Days Discharge Instructions: Apply over primary dressing as directed. Com pression Wrap: ThreePress (3 layer compression wrap) 1 x Per Week/30 Days Discharge Instructions: Apply three layer compression ***ENSURE TO WRAP AT THE BASE OF THE TOES.*** 1. I would recommend currently that we go ahead and continue with the wound care measures as before and the patient is in agreement the plan. This includes the use of the [facility] which I think is still good to do quite well for her. 2. I am also can recommend that we have the patient continue with an ABD pad to cover followed by 3 layer compression wrap which I do feel like is doing quite well. 3. Organ to use a little bit  of gentamicin underneath the Hydrofera Blue just to make sure there is no signs of infection that may be causing some of the increased pain for her. We will see patient back for reevaluation in 1 week here in the clinic. If anything worsens or changes patient will contact our office for additional recommendations. Electronic Signature(s) Signed: 04/20/2022 10:24:26 AM By: Worthy Keeler PA-C Entered By: Worthy Keeler on 04/20/2022 10:24:26 -------------------------------------------------------------------------------- SuperBill Details Patient Name: Date of Service: Kristen [name] 04/20/2022 Medical Record Number: [id number] Patient Account Number: [id number] Date of Birth/Sex: Treating RN: 10-10-1941 (81 y.o. Kristen [name], Kristen [name] Primary Care Provider: Cipriano Mile Other Clinician: Referring Provider: Treating Provider/Extender: Jolyne Loa Weeks in Treatment: 1 Diagnosis Coding ICD-10 Codes Code Description [id number] Abrasion, right lower leg, subsequent encounter L97.818 Non-pressure chronic ulcer of other part of right lower leg with other specified severity I89.0 Lymphedema, not elsewhere classified I87.323 Chronic venous hypertension (idiopathic) with inflammation of bilateral lower extremity Facility Procedures CPT4 Code: [id number] Description: [place] - DEB SUBQ TISSUE 20 SQ CM/< ICD-10 Diagnosis Description L97.818 Non-pressure chronic ulcer of other part of right lower leg with other specified Modifier: severity Quantity: 1 Physician Procedures : CPT4 Code Description Modifier [id number] - WC PHYS SUBQ TISS 20 SQ CM ICD-10 Diagnosis Description L97.818 Non-pressure chronic ulcer of other part of right lower leg with other specified severity Quantity: 1 Electronic Signature(s) Signed: 04/20/2022 10:27:47 AM By: Worthy Keeler PA-C Entered By: Worthy Keeler on 04/20/2022 10:27:47

## 2022-04-20 NOTE — Progress Notes (Signed)
Kristen [name] ([id number]) Visit Report for 04/20/2022 Arrival Information Details Patient Name: Date of Service: Kristen [name], Kristen [name] 04/20/2022 9:00 A M Medical Record Number: [id number] Patient Account Number: [id number] Date of Birth/Sex: Treating RN: [birth year] (81 y.o. Kristen [name], [name] Primary Care Emilly Lavey: Cipriano Mile Other Clinician: Referring Trinity Hyland: Treating Jezel Basto/Extender: Jolyne Loa Weeks in Treatment: 1 Visit Information History Since Last Visit Added or deleted any medications: No Patient Arrived: Ambulatory Any new allergies or adverse reactions: No Arrival Time: 09:35 Had a fall or experienced change in No Accompanied By: Self activities of daily living that may affect Transfer Assistance: None risk of falls: Patient Identification Verified: Yes Signs or symptoms of abuse/neglect since last visito No Secondary Verification Process Completed: Yes Hospitalized since last visit: No Patient Requires Transmission-Based Precautions: No Implantable device outside of the clinic excluding No Patient Has Alerts: Yes cellular tissue based products placed in the center Patient Alerts:  left ABI 04/13/2022 since last visit: Has Dressing in Place as Prescribed: Yes Has Compression in Place as Prescribed: Yes Pain Present Now: Yes Electronic Signature(s) Signed: 04/20/2022 5:24:59 PM By: Deon Pilling RN, BSN Entered By: Deon Pilling on 04/20/2022 09:35:18 -------------------------------------------------------------------------------- Compression Therapy Details Patient Name: Date of Service: Kristen [name] 04/20/2022 9:00 [place] Record Number: [id number] Patient Account Number: [id number] Date of Birth/Sex: Treating RN: [birth year] (81 y.o. Kristen [name] Primary Care Lansing Sigmon: Cipriano Mile Other Clinician: Referring Jedrick Hutcherson: Treating Carnelius Hammitt/Extender: Jolyne Loa Weeks in Treatment: 1 Compression Therapy  Performed for Wound Assessment: Wound #1 Right,Lateral Lower Leg Performed By: Clinician Deon Pilling, RN Compression Type: Three Layer Post Procedure Diagnosis Same as Pre-procedure Electronic Signature(s) Signed: 04/20/2022 5:24:59 PM By: Deon Pilling RN, BSN Entered By: Deon Pilling on 04/20/2022 10:16:08 -------------------------------------------------------------------------------- Lower Extremity Assessment Details Patient Name: Date of Service: Kristen [name] 04/20/2022 9:00 A M Medical Record Number: [id number] Patient Account Number: [id number] Date of Birth/Sex: Treating RN: [birth year] (81 y.o. Kristen [name] Primary Care Moriya Mitchell: Cipriano Mile Other Clinician: Referring Sagar Tengan: Treating Hedaya Latendresse/Extender: Jolyne Loa Weeks in Treatment: 1 Edema Assessment Assessed: [Left: No] [Right: Yes] Edema: [Left: Ye] [Right: s] Calf Left: Right: Point of Measurement: 28 cm From Medial Instep 37 cm Ankle Left: Right: Point of Measurement: 10 cm From Medial Instep 27 cm Vascular Assessment Pulses: Dorsalis Pedis Palpable: [Right:Yes] Electronic Signature(s) Signed: 04/20/2022 5:24:59 PM By: Deon Pilling RN, BSN Entered By: Deon Pilling on 04/20/2022 09:36:11 -------------------------------------------------------------------------------- Kristen [name] Details Patient Name: Date of Service: Kristen [name] 04/20/2022 9:00 A M Medical Record Number: [id number] Patient Account Number: [id number] Date of Birth/Sex: Treating RN: January 04, 1941 (81 y.o. Kristen [name] Primary Care Jazzalyn [name]: Cipriano Mile Other Clinician: Referring Raydel Hosick: Treating Dorea Duff/Extender: Jolyne Loa Weeks in Treatment: 1 Active Inactive Pain, Acute or Chronic Nursing Diagnoses: Pain, acute or chronic: actual or potential Potential alteration in comfort, pain Goals: Patient will verbalize adequate pain control and receive pain  control interventions during procedures as needed Date Initiated: 04/13/2022 Target Resolution Date: 05/13/2022 Goal Status: Active Patient/caregiver will verbalize comfort level met Date Initiated: 04/13/2022 Target Resolution Date: 05/12/2022 Goal Status: Active Interventions: Encourage patient to take pain medications as prescribed Provide education on pain management Reposition patient for comfort Treatment Activities: Administer pain control measures as ordered : 04/13/2022 Notes: Wound/Skin Impairment Nursing Diagnoses: Knowledge deficit related to ulceration/compromised skin integrity Goals: Patient/caregiver will verbalize understanding of skin care regimen Date Initiated: 04/13/2022 Target Resolution Date: 05/13/2022 Goal  Status: Active Interventions: Assess patient/caregiver ability to perform ulcer/skin care regimen upon admission and as needed Assess ulceration(s) every visit Provide education on ulcer and skin care Treatment Activities: Skin care regimen initiated : 04/13/2022 Topical wound management initiated : 04/13/2022 Notes: Electronic Signature(s) Signed: 04/20/2022 5:24:59 PM By: Deon Pilling RN, BSN Entered By: Deon Pilling on 04/20/2022 10:09:56 -------------------------------------------------------------------------------- Pain Assessment Details Patient Name: Date of Service: Kristen [name] 04/20/2022 9:00 A M Medical Record Number: [id number] Patient Account Number: [id number] Date of Birth/Sex: Treating RN: 09-Oct-1941 (81 y.o. Kristen [name] Primary Care Javeion Cannedy: Cipriano Mile Other Clinician: Referring Anaston Koehn: Treating Jermya Dowding/Extender: Jolyne Loa Weeks in Treatment: 1 Active Problems Location of Pain Severity and Description of Pain Patient Has Paino Yes Site Locations Rate the pain. Current Pain Level: 8 Pain Management and Medication Current Pain Management: Medication: No Cold Application: No Rest: No Massage:  No Activity: No T.E.N.S.: No Heat Application: No Leg drop or elevation: No Is the Current Pain Management Adequate: Adequate How does your wound impact your activities of daily livingo Sleep: No Bathing: No Appetite: No Relationship With Others: No Bladder Continence: No Emotions: No Bowel Continence: No Work: No Toileting: No Drive: No Dressing: No Hobbies: No Engineer, maintenance) Signed: 04/20/2022 5:24:59 PM By: Deon Pilling RN, BSN Entered By: Deon Pilling on 04/20/2022 09:35:51 -------------------------------------------------------------------------------- Patient/Caregiver Education Details Patient Name: Date of Service: Miracle, Candace H. 6/14/2023andnbsp9:00 A M Medical Record Number: [id number] Patient Account Number: [id number] Date of Birth/Gender: Treating RN: Aug 16, 1941 (81 y.o. Kristen [name] Primary Care Physician: Cipriano Mile Other Clinician: Referring Physician: Treating Physician/Extender: Jolyne Loa Weeks in Treatment: 1 Education Assessment Education Provided To: Patient Education Topics Provided Wound/Skin Impairment: Handouts: Skin Care Do's and Dont's Methods: Explain/Verbal Responses: Reinforcements needed Electronic Signature(s) Signed: 04/20/2022 5:24:59 PM By: Deon Pilling RN, BSN Entered By: Deon Pilling on 04/20/2022 10:10:07 -------------------------------------------------------------------------------- Wound Assessment Details Patient Name: Date of Service: Kristen [name] 04/20/2022 9:00 A M Medical Record Number: [id number] Patient Account Number: [id number] Date of Birth/Sex: Treating RN: 1940/11/13 (81 y.o. Kristen [name], [name] Primary Care Rand Etchison: Cipriano Mile Other Clinician: Referring Dyami Umbach: Treating Azelie Noguera/Extender: Jolyne Loa Weeks in Treatment: 1 Wound Status Wound Number: 1 Primary Abrasion Etiology: Wound Location: Right, Lateral Lower Leg Secondary  Lymphedema Wounding Event: Laceration Etiology: Date Acquired: 04/06/2022 Wound Open Weeks Of Treatment: 1 Status: Clustered Wound: No Comorbid Cataracts, Arrhythmia, Congestive Heart Failure, Hypertension, History: Peripheral Venous Disease, Osteoarthritis Photos Wound Measurements Length: (cm) 4 Width: (cm) 2.8 Depth: (cm) 0.1 Area: (cm) 8.796 Volume: (cm) 0.88 % Reduction in Area: 25.3% % Reduction in Volume: 25.3% Epithelialization: Small (1-33%) Tunneling: No Undermining: No Wound Description Classification: Full Thickness Without Exposed Support Structures Wound Margin: Distinct, outline attached Exudate Amount: Medium Exudate Type: Serosanguineous Exudate Color: red, brown Foul Odor After Cleansing: No Slough/Fibrino Yes Wound Bed Granulation Amount: Small (1-33%) Exposed Structure Granulation Quality: Red Fascia Exposed: No Necrotic Amount: Large (67-100%) Fat Layer (Subcutaneous Tissue) Exposed: Yes Necrotic Quality: Adherent Slough Tendon Exposed: No Muscle Exposed: No Joint Exposed: No Bone Exposed: No Electronic Signature(s) Signed: 04/20/2022 5:24:59 PM By: Deon Pilling RN, BSN Entered By: Deon Pilling on 04/20/2022 09:33:43 -------------------------------------------------------------------------------- Vitals Details Patient Name: Date of Service: Kristen [name] 04/20/2022 9:00 A M Medical Record Number: [id number] Patient Account Number: [id number] Date of Birth/Sex: Treating RN: 03-27-1941 (81 y.o. Kristen [name] Primary Care Keriana Sarsfield: Cipriano Mile Other Clinician: Referring Turquoise Esch: Treating Trea Carnegie/Extender: Oneida Arenas, Rachel Moulds Suella Grove  in Treatment: 1 Vital Signs Time Taken: 09:35 Temperature (F): 98.2 Pulse (bpm): 128 Respiratory Rate (breaths/min): 20 Blood Pressure (mmHg): 123/78 Reference Range: 80 - 120 mg / dl Electronic Signature(s) Signed: 04/20/2022 5:24:59 PM By: Deon Pilling RN, BSN Entered By: Deon Pilling on 04/20/2022 09:35:40

## 2022-04-27 ENCOUNTER — Encounter (HOSPITAL_BASED_OUTPATIENT_CLINIC_OR_DEPARTMENT_OTHER): Payer: Medicare Other | Admitting: Physician Assistant

## 2022-04-27 DIAGNOSIS — L97818 Non-pressure chronic ulcer of other part of right lower leg with other specified severity: Secondary | ICD-10-CM | POA: Diagnosis not present

## 2022-04-27 NOTE — Progress Notes (Signed)
AMANPREET, [place] ([id number]) Visit Report for 04/27/2022 Arrival Information Details Patient Name: Date of Service: Kristen [name], Kristen [name] 04/27/2022 1:30 PM Medical Record Number: [id number] Patient Account Number: [id number] Date of Birth/Sex: Treating RN: [birth year] (81 y.o. [name], [name] Primary Care Bhavya Grand: Cipriano Mile Other Clinician: Referring Kori Goins: Treating Jakaiden Fill/Extender: Jolyne Loa Weeks in Treatment: 2 Visit Information History Since Last Visit Added or deleted any medications: No Patient Arrived: Ambulatory Any new allergies or adverse reactions: No Arrival Time: 13:36 Had a fall or experienced change in No Accompanied By: self activities of daily living that may affect Transfer Assistance: None risk of falls: Patient Requires Transmission-Based Precautions: No Signs or symptoms of abuse/neglect since last visito No Patient Has Alerts: Yes Hospitalized since last visit: No Patient Alerts: [place] left ABI 04/13/2022 Implantable device outside of the clinic excluding No cellular tissue based products placed in the center since last visit: Has Dressing in Place as Prescribed: Yes Has Compression in Place as Prescribed: Yes Pain Present Now: Yes Electronic Signature(s) Signed: 04/27/2022 4:19:27 PM By: Deon Pilling RN, BSN Entered By: Deon Pilling on 04/27/2022 13:37:13 -------------------------------------------------------------------------------- Compression Therapy Details Patient Name: Date of Service: Kristen [name] 04/27/2022 1:30 PM Medical Record Number: [id number] Patient Account Number: [id number] Date of Birth/Sex: Treating RN: 1941-01-29 (81 y.o. [name] Primary Care Merla Sawka: Cipriano Mile Other Clinician: Referring Steffany Schoenfelder: Treating Darrek Leasure/Extender: Jolyne Loa Weeks in Treatment: 2 Compression Therapy Performed for Wound Assessment: Wound #1 Right,Lateral Lower Leg Performed By: Clinician  Deon Pilling, RN Compression Type: Three Layer Post Procedure Diagnosis Same as Pre-procedure Electronic Signature(s) Signed: 04/27/2022 4:19:27 PM By: Deon Pilling RN, BSN Entered By: Deon Pilling on 04/27/2022 13:50:28 -------------------------------------------------------------------------------- Encounter Discharge Information Details Patient Name: Date of Service: Kristen [name] 04/27/2022 1:30 PM Medical Record Number: [id number] Patient Account Number: [id number] Date of Birth/Sex: Treating RN: 1941/08/04 (81 y.o. [name] Primary Care Dariona Postma: Cipriano Mile Other Clinician: Referring Reene Harlacher: Treating Toniette Devera/Extender: Jolyne Loa Weeks in Treatment: 2 Encounter Discharge Information Items Discharge Condition: Stable Ambulatory Status: Ambulatory Discharge Destination: Home Transportation: Private Auto Accompanied By: self Schedule Follow-up Appointment: Yes Clinical Summary of Care: Electronic Signature(s) Signed: 04/27/2022 4:19:27 PM By: Deon Pilling RN, BSN Entered By: Deon Pilling on 04/27/2022 13:54:24 -------------------------------------------------------------------------------- Lower Extremity Assessment Details Patient Name: Date of Service: Kristen [name] 04/27/2022 1:30 PM Medical Record Number: [id number] Patient Account Number: [id number] Date of Birth/Sex: Treating RN: 1940/12/01 (81 y.o. [name] Primary Care Saiya Crist: Cipriano Mile Other Clinician: Referring Cassity Christian: Treating Jaxon Flatt/Extender: Jolyne Loa Weeks in Treatment: 2 Edema Assessment Assessed: [Left: No] [Right: Yes] Edema: [Left: Ye] [Right: s] Calf Left: Right: Point of Measurement: 28 cm From Medial Instep 35 cm Ankle Left: Right: Point of Measurement: 10 cm From Medial Instep 24 cm Vascular Assessment Pulses: Dorsalis Pedis Palpable: [Right:Yes] Electronic Signature(s) Signed: 04/27/2022 4:19:27 PM By:  Deon Pilling RN, BSN Entered By: Deon Pilling on 04/27/2022 13:39:53 -------------------------------------------------------------------------------- Multi-Disciplinary Care Plan Details Patient Name: Date of Service: Kristen [name] 04/27/2022 1:30 PM Medical Record Number: [id number] Patient Account Number: [id number] Date of Birth/Sex: Treating RN: 08/12/1941 (81 y.o. [name] Primary Care Evaleen Sant: Cipriano Mile Other Clinician: Referring Tristy Udovich: Treating Ranesha Val/Extender: Jolyne Loa Weeks in Treatment: 2 Active Inactive Pain, Acute or Chronic Nursing Diagnoses: Pain, acute or chronic: actual or potential Potential alteration in comfort, pain Goals: Patient will verbalize adequate pain control and receive pain control interventions  during procedures as needed Date Initiated: 04/13/2022 Target Resolution Date: 05/13/2022 Goal Status: Active Patient/caregiver will verbalize comfort level met Date Initiated: 04/13/2022 Target Resolution Date: 05/12/2022 Goal Status: Active Interventions: Encourage patient to take pain medications as prescribed Provide education on pain management Reposition patient for comfort Treatment Activities: Administer pain control measures as ordered : 04/13/2022 Notes: Wound/Skin Impairment Nursing Diagnoses: Knowledge deficit related to ulceration/compromised skin integrity Goals: Patient/caregiver will verbalize understanding of skin care regimen Date Initiated: 04/13/2022 Target Resolution Date: 05/13/2022 Goal Status: Active Interventions: Assess patient/caregiver ability to perform ulcer/skin care regimen upon admission and as needed Assess ulceration(s) every visit Provide education on ulcer and skin care Treatment Activities: Skin care regimen initiated : 04/13/2022 Topical wound management initiated : 04/13/2022 Notes: Electronic Signature(s) Signed: 04/27/2022 4:19:27 PM By: Deon Pilling RN, BSN Entered By:  Deon Pilling on 04/27/2022 13:50:04 -------------------------------------------------------------------------------- Pain Assessment Details Patient Name: Date of Service: Kristen [name] 04/27/2022 1:30 PM Medical Record Number: [id number] Patient Account Number: [id number] Date of Birth/Sex: Treating RN: November 11, 1940 (81 y.o. [name] Primary Care Niccolo Burggraf: Cipriano Mile Other Clinician: Referring Tareek Sabo: Treating Kathlean Cinco/Extender: Jolyne Loa Weeks in Treatment: 2 Active Problems Location of Pain Severity and Description of Pain Patient Has Paino Yes Site Locations Pain Location: Generalized Pain, Pain in Ulcers Rate the pain. Current Pain Level: 8 Character of Pain Describe the Pain: Shooting Pain Management and Medication Current Pain Management: Medication: No Cold Application: No Rest: No Massage: No Activity: No T.E.N.S.: No Heat Application: No Leg drop or elevation: No Is the Current Pain Management Adequate: Adequate How does your wound impact your activities of daily livingo Sleep: No Bathing: No Appetite: No Relationship With Others: No Bladder Continence: No Emotions: No Bowel Continence: No Work: No Toileting: No Drive: No Dressing: No Hobbies: No Engineer, maintenance) Signed: 04/27/2022 4:19:27 PM By: Deon Pilling RN, BSN Entered By: Deon Pilling on 04/27/2022 13:37:33 -------------------------------------------------------------------------------- Patient/Caregiver Education Details Patient Name: Date of Service: Kristen [name] 6/21/2023andnbsp1:30 PM Medical Record Number: [id number] Patient Account Number: [id number] Date of Birth/Gender: Treating RN: February 21, 1941 (81 y.o. [name] Primary Care Physician: Cipriano Mile Other Clinician: Referring Physician: Treating Physician/Extender: Jolyne Loa Weeks in Treatment: 2 Education Assessment Education Provided  To: Patient Education Topics Provided Pain: Handouts: A Guide to Pain Control Methods: Explain/Verbal Responses: Reinforcements needed Electronic Signature(s) Signed: 04/27/2022 4:19:27 PM By: Deon Pilling RN, BSN Entered By: Deon Pilling on 04/27/2022 13:50:16 -------------------------------------------------------------------------------- Wound Assessment Details Patient Name: Date of Service: Kristen [name] 04/27/2022 1:30 PM Medical Record Number: [id number] Patient Account Number: [id number] Date of Birth/Sex: Treating RN: 1940-11-16 (81 y.o. [name], [name] Primary Care Orson Rho: Cipriano Mile Other Clinician: Referring Levander Katzenstein: Treating Sharryn Belding/Extender: Jolyne Loa Weeks in Treatment: 2 Wound Status Wound Number: 1 Primary Abrasion Etiology: Wound Location: Right, Lateral Lower Leg Secondary Lymphedema Wounding Event: Laceration Etiology: Date Acquired: 04/06/2022 Wound Open Weeks Of Treatment: 2 Status: Clustered Wound: No Comorbid Cataracts, Arrhythmia, Congestive Heart Failure, Hypertension, History: Peripheral Venous Disease, Osteoarthritis Photos Wound Measurements Length: (cm) 2.7 Width: (cm) 2 Depth: (cm) 0.1 Area: (cm) 4.241 Volume: (cm) 0.424 % Reduction in Area: 64% % Reduction in Volume: 64% Epithelialization: Medium (34-66%) Tunneling: No Undermining: No Wound Description Classification: Full Thickness Without Exposed Support Structures Wound Margin: Distinct, outline attached Exudate Amount: Medium Exudate Type: Serosanguineous Exudate Color: red, brown Foul Odor After Cleansing: No Slough/Fibrino Yes Wound Bed Granulation Amount: Large (67-100%) Exposed Structure Granulation Quality: Red Fascia  Exposed: No Necrotic Amount: Small (1-33%) Fat Layer (Subcutaneous Tissue) Exposed: Yes Necrotic Quality: Adherent Slough Tendon Exposed: No Muscle Exposed: No Joint Exposed: No Bone Exposed: No Treatment  Notes Wound #1 (Lower Leg) Wound Laterality: Right, Lateral Cleanser Soap and Water Discharge Instruction: May shower and wash wound with dial antibacterial soap and water prior to dressing change. Wound Cleanser Discharge Instruction: Cleanse the wound with wound cleanser prior to applying a clean dressing using gauze sponges, not tissue or cotton balls. Peri-Wound Care Sween Lotion (Moisturizing lotion) Discharge Instruction: Apply moisturizing lotion as directed Topical Primary Dressing Xeroform Occlusive Gauze Dressing, 4x4 in Discharge Instruction: double the xeroform and cut slits in the xeroform apply to wound bed. Secondary Dressing ABD Pad, 8x10 Discharge Instruction: Apply over primary dressing as directed. Woven Gauze Sponge, Non-Sterile 4x4 in Discharge Instruction: Apply over primary dressing as directed. Secured With Compression Wrap ThreePress (3 layer compression wrap) Discharge Instruction: Apply three layer compression ***ENSURE TO WRAP AT THE BASE OF THE TOES.*** Compression Stockings Add-Ons Electronic Signature(s) Signed: 04/27/2022 4:19:27 PM By: Deon Pilling RN, BSN Entered By: Deon Pilling on 04/27/2022 13:46:40 -------------------------------------------------------------------------------- Vitals Details Patient Name: Date of Service: Kristen [name] 04/27/2022 1:30 PM Medical Record Number: [id number] Patient Account Number: [id number] Date of Birth/Sex: Treating RN: 1941/11/04 (81 y.o. [name] Primary Care Christine Morton: Cipriano Mile Other Clinician: Referring Susette Seminara: Treating Braxxton Stoudt/Extender: Jolyne Loa Weeks in Treatment: 2 Vital Signs Time Taken: 13:40 Temperature (F): 98.3 Pulse (bpm): 86 Respiratory Rate (breaths/min): 20 Blood Pressure (mmHg): 124/80 Reference Range: 80 - 120 mg / dl Electronic Signature(s) Signed: 04/27/2022 4:19:27 PM By: Deon Pilling RN, BSN Signed: 04/27/2022 4:19:27 PM By:  Deon Pilling RN, BSN Entered By: Deon Pilling on 04/27/2022 13:43:46

## 2022-04-27 NOTE — Progress Notes (Addendum)
Kristen [name], Kristen [name] ([id number]) Visit Report for 04/27/2022 Chief Complaint Document Details Patient Name: Date of Service: Kristen [name], Kristen [name] 04/27/2022 1:30 PM Medical Record Number: [id number] Patient Account Number: [id number] Date of Birth/Sex: Treating RN: [birth year] (81 y.o. Kristen [name] Primary Care Provider: Cipriano Mile Other Clinician: Referring Provider: Treating Provider/Extender: Jolyne Loa Weeks in Treatment: 2 Information Obtained from: Patient Chief Complaint 04/13/2022; patient is here for review of a wound on her right lateral lower leg Electronic Signature(s) Signed: 04/27/2022 1:48:45 PM By: Worthy Keeler PA-C Entered By: Worthy Keeler on 04/27/2022 13:48:44 -------------------------------------------------------------------------------- HPI Details Patient Name: Date of Service: Kristen [name] 04/27/2022 1:30 PM Medical Record Number: [id number] Patient Account Number: [id number] Date of Birth/Sex: Treating RN: [birth year] (81 y.o. Kristen [name] Primary Care Provider: Cipriano Mile Other Clinician: Referring Provider: Treating Provider/Extender: Jolyne Loa Weeks in Treatment: 2 History of Present Illness HPI Description: ADMISSION 04/13/2022 This is an 81 year old woman who lives on her own. She has a longstanding history of edema in her legs saw Dr. Doren Custard of vein and vascular on 01/09/2022. He diagnosed her with chronic venous insufficiency and lymphedema prescribed stockings. She did have venous studies that showed reflux in the right common femoral vein. The great saphenous vein was unable to be visualized no evidence of DVT or SVT For 1 reason or another the patient did not get the stockings. Unfortunately last week she suffered a fall and had a fairly clear deep skin tear on the right lateral lower leg. She is here for our review of this. She was put on clindamycin last week I think by her primary doctor and  referred here. Past medical history includes hyperlipidemia, lower extremity edema, hiatal hernia, atrial fib on Eliquis, ankle fracture, diastolic congestive heart failure, chronic kidney disease stage III, gout, scoliosis ABIs in our clinic were noncompressible bilaterally. Readmission: 04-20-2022 upon evaluation today patient appears to be doing well currently in regard to her wound in my opinion. She is actually showing signs of significant improvement which is great news. Fortunately I do not see any evidence of active infection locally or systemically at this time. No fevers, chills, nausea, vomiting, or diarrhea. 04-27-2022 upon evaluation today patient's wound is actually showing signs of improvement based on what I am seeing today the measurements are already smaller and very pleased in that regard. Fortunately I do not see any evidence of active infection locally or systemically which is great news and overall I think that we are on the right track here. No fevers, chills, nausea, vomiting, or diarrhea. Electronic Signature(s) Signed: 04/27/2022 4:59:05 PM By: Worthy Keeler PA-C Entered By: Worthy Keeler on 04/27/2022 16:59:05 -------------------------------------------------------------------------------- Physical Exam Details Patient Name: Date of Service: Kristen [name] 04/27/2022 1:30 PM Medical Record Number: [id number] Patient Account Number: [id number] Date of Birth/Sex: Treating RN: [birth year] (81 y.o. Kristen [name] Primary Care Provider: Cipriano Mile Other Clinician: Referring Provider: Treating Provider/Extender: Jolyne Loa Weeks in Treatment: 2 Constitutional Well-nourished and well-hydrated in no acute distress. Respiratory normal breathing without difficulty. Psychiatric this patient is able to make decisions and demonstrates good insight into disease process. Alert and Oriented x 3. pleasant and cooperative. Notes Upon inspection  patient's wound bed I did not perform debridement on currently as it was very tender due to the dressing being stuck. I think we are can make a switch in the dressing we can use Xeroform gauze to see if that would  be better for her she is in agreement with that plan Electronic Signature(s) Signed: 04/27/2022 4:59:23 PM By: Worthy Keeler PA-C Entered By: Worthy Keeler on 04/27/2022 16:59:23 -------------------------------------------------------------------------------- Physician Orders Details Patient Name: Date of Service: Kristen [name], Kristen [name] 04/27/2022 1:30 PM Medical Record Number: [id number] Patient Account Number: [id number] Date of Birth/Sex: Treating RN: 1941-10-20 (81 y.o. Kristen [name], Kristen [name] Primary Care Provider: Cipriano Mile Other Clinician: Referring Provider: Treating Provider/Extender: Jolyne Loa Weeks in Treatment: 2 Verbal / Phone Orders: No Diagnosis Coding ICD-10 Coding Code Description S80.811D Abrasion, right lower leg, subsequent encounter L97.818 Non-pressure chronic ulcer of other part of right lower leg with other specified severity I89.0 Lymphedema, not elsewhere classified I87.323 Chronic venous hypertension (idiopathic) with inflammation of bilateral lower extremity Follow-up Appointments ppointment in 1 week. Kristen Cos, PA and Kristen [name], Room 8 Wednesday 05/04/2022 130PM Return Kristen [name], [place] and [name], Room 8 Wednesday 05/11/2022 130pm Bathing/ Shower/ Hygiene May shower with protection but do not get wound dressing(s) wet. Edema Control - Lymphedema / SCD / Other Elevate legs to the level of the heart or above for 30 minutes daily and/or when sitting, a frequency of: - 3-4 times a day throughout the day. Avoid standing for long periods of time. Exercise regularly Moisturize legs daily. - left every night before bed. Compression stocking or Garment 30-40 mm/Hg pressure to: - wear to left leg apply in the morning and remove at night.  Follow up with Dr. Scot Dock for the stockings he ordered for you. Wound Treatment Wound #1 - Lower Leg Wound Laterality: Right, Lateral Cleanser: Soap and Water 1 x Per Week/30 Days Discharge Instructions: May shower and wash wound with dial antibacterial soap and water prior to dressing change. Cleanser: Wound Cleanser 1 x Per Week/30 Days Discharge Instructions: Cleanse the wound with wound cleanser prior to applying a clean dressing using gauze sponges, not tissue or cotton balls. Peri-Wound Care: Sween Lotion (Moisturizing lotion) 1 x Per Week/30 Days Discharge Instructions: Apply moisturizing lotion as directed Prim Dressing: Xeroform Occlusive Gauze Dressing, 4x4 in 1 x Per Week/30 Days ary Discharge Instructions: double the xeroform and cut slits in the xeroform apply to wound bed. Secondary Dressing: ABD Pad, 8x10 1 x Per Week/30 Days Discharge Instructions: Apply over primary dressing as directed. Secondary Dressing: Woven Gauze Sponge, Non-Sterile 4x4 in 1 x Per Week/30 Days Discharge Instructions: Apply over primary dressing as directed. Compression Wrap: ThreePress (3 layer compression wrap) 1 x Per Week/30 Days Discharge Instructions: Apply three layer compression ***ENSURE TO WRAP AT THE BASE OF THE TOES.*** Electronic Signature(s) Signed: 04/27/2022 4:19:27 PM By: Deon Pilling RN, BSN Signed: 04/27/2022 5:09:34 PM By: Worthy Keeler PA-C Entered By: Deon Pilling on 04/27/2022 13:53:43 -------------------------------------------------------------------------------- Problem List Details Patient Name: Date of Service: Kristen [name] 04/27/2022 1:30 PM Medical Record Number: [id number] Patient Account Number: [id number] Date of Birth/Sex: Treating RN: [birth year] (81 y.o. Kristen [name] Primary Care Provider: Cipriano Mile Other Clinician: Referring Provider: Treating Provider/Extender: Jolyne Loa Weeks in Treatment: 2 Active  Problems ICD-10 Encounter Code Description Active Date MDM Diagnosis S80.811D Abrasion, right lower leg, subsequent encounter 04/13/2022 No Yes L97.818 Non-pressure chronic ulcer of other part of right lower leg with other specified 04/13/2022 No Yes severity I89.0 Lymphedema, not elsewhere classified 04/13/2022 No Yes I87.323 Chronic venous hypertension (idiopathic) with inflammation of bilateral lower 04/13/2022 No Yes extremity Inactive Problems Resolved Problems Electronic Signature(s) Signed: 04/27/2022 1:40:29 PM By: Worthy Keeler  PA-C Entered By: Worthy Keeler on 04/27/2022 13:40:28 -------------------------------------------------------------------------------- Progress Note Details Patient Name: Date of Service: Kristen [name], Kristen [name] 04/27/2022 1:30 PM Medical Record Number: [id number] Patient Account Number: [id number] Date of Birth/Sex: Treating RN: 1941/11/04 (81 y.o. Kristen [name] Primary Care Provider: Cipriano Mile Other Clinician: Referring Provider: Treating Provider/Extender: Jolyne Loa Weeks in Treatment: 2 Subjective Chief Complaint Information obtained from Patient 04/13/2022; patient is here for review of a wound on her right lateral lower leg History of Present Illness (HPI) ADMISSION 04/13/2022 This is an 81 year old woman who lives on her own. She has a longstanding history of edema in her legs saw Dr. Doren Custard of vein and vascular on 01/09/2022. He diagnosed her with chronic venous insufficiency and lymphedema prescribed stockings. She did have venous studies that showed reflux in the right common femoral vein. The great saphenous vein was unable to be visualized no evidence of DVT or SVT For 1 reason or another the patient did not get the stockings. Unfortunately last week she suffered a fall and had a fairly clear deep skin tear on the right lateral lower leg. She is here for our review of this. She was put on clindamycin last week I think by her  primary doctor and referred here. Past medical history includes hyperlipidemia, lower extremity edema, hiatal hernia, atrial fib on Eliquis, ankle fracture, diastolic congestive heart failure, chronic kidney disease stage III, gout, scoliosis ABIs in our clinic were noncompressible bilaterally. Readmission: 04-20-2022 upon evaluation today patient appears to be doing well currently in regard to her wound in my opinion. She is actually showing signs of significant improvement which is great news. Fortunately I do not see any evidence of active infection locally or systemically at this time. No fevers, chills, nausea, vomiting, or diarrhea. 04-27-2022 upon evaluation today patient's wound is actually showing signs of improvement based on what I am seeing today the measurements are already smaller and very pleased in that regard. Fortunately I do not see any evidence of active infection locally or systemically which is great news and overall I think that we are on the right track here. No fevers, chills, nausea, vomiting, or diarrhea. Objective Constitutional Well-nourished and well-hydrated in no acute distress. Vitals Time Taken: 1:40 PM, Temperature: 98.3 F, Pulse: 86 bpm, Respiratory Rate: 20 breaths/min, Blood Pressure: 124/80 mmHg. Respiratory normal breathing without difficulty. Psychiatric this patient is able to make decisions and demonstrates good insight into disease process. Alert and Oriented x 3. pleasant and cooperative. General Notes: Upon inspection patient's wound bed I did not perform debridement on currently as it was very tender due to the dressing being stuck. I think we are can make a switch in the dressing we can use Xeroform gauze to see if that would be better for her she is in agreement with that plan Integumentary (Hair, Skin) Wound #1 status is Open. Original cause of wound was Laceration. The date acquired was: 04/06/2022. The wound has been in treatment 2 weeks. The  wound is located on the Right,Lateral Lower Leg. The wound measures 2.7cm length x 2cm width x 0.1cm depth; 4.241cm^2 area and 0.424cm^3 volume. There is Fat Layer (Subcutaneous Tissue) exposed. There is no tunneling or undermining noted. There is a medium amount of serosanguineous drainage noted. The wound margin is distinct with the outline attached to the wound base. There is large (67-100%) red granulation within the wound bed. There is a small (1-33%) amount of necrotic tissue within the wound bed including  Adherent Slough. Assessment Active Problems ICD-10 Abrasion, right lower leg, subsequent encounter Non-pressure chronic ulcer of other part of right lower leg with other specified severity Lymphedema, not elsewhere classified Chronic venous hypertension (idiopathic) with inflammation of bilateral lower extremity Procedures Wound #1 Pre-procedure diagnosis of Wound #1 is an Abrasion located on the Right,Lateral Lower Leg . There was a Three Layer Compression Therapy Procedure by Deon Pilling, RN. Post procedure Diagnosis Wound #1: Same as Pre-Procedure Plan Follow-up Appointments: Return Appointment in 1 week. Kristen Cos, PA and [place], Room 8 Wednesday 05/04/2022 130PM Kristen [name], [place] and [place], Room 8 Wednesday 05/11/2022 130pm Bathing/ Shower/ Hygiene: May shower with protection but do not get wound dressing(s) wet. Edema Control - Lymphedema / SCD / Other: Elevate legs to the level of the heart or above for 30 minutes daily and/or when sitting, a frequency of: - 3-4 times a day throughout the day. Avoid standing for long periods of time. Exercise regularly Moisturize legs daily. - left every night before bed. Compression stocking or Garment 30-40 mm/Hg pressure to: - wear to left leg apply in the morning and remove at night. Follow up with Dr. Scot Dock for the stockings he ordered for you. WOUND #1: - Lower Leg Wound Laterality: Right, Lateral Cleanser: Soap and Water 1 x Per  Week/30 Days Discharge Instructions: May shower and wash wound with dial antibacterial soap and water prior to dressing change. Cleanser: Wound Cleanser 1 x Per Week/30 Days Discharge Instructions: Cleanse the wound with wound cleanser prior to applying a clean dressing using gauze sponges, not tissue or cotton balls. Peri-Wound Care: Sween Lotion (Moisturizing lotion) 1 x Per Week/30 Days Discharge Instructions: Apply moisturizing lotion as directed Prim Dressing: Xeroform Occlusive Gauze Dressing, 4x4 in 1 x Per Week/30 Days ary Discharge Instructions: double the xeroform and cut slits in the xeroform apply to wound bed. Secondary Dressing: ABD Pad, 8x10 1 x Per Week/30 Days Discharge Instructions: Apply over primary dressing as directed. Secondary Dressing: Woven Gauze Sponge, Non-Sterile 4x4 in 1 x Per Week/30 Days Discharge Instructions: Apply over primary dressing as directed. Com pression Wrap: ThreePress (3 layer compression wrap) 1 x Per Week/30 Days Discharge Instructions: Apply three layer compression ***ENSURE TO WRAP AT THE BASE OF THE TOES.*** 1. We will make the switch over to the Xeroform gauze which I think is good to be the best way to go currently. 2. We are going to continue as well with the compression wrapping. I think this is doing a really good job for her. 3. I would recommend she continue to elevate her legs much as possible to try to help with edema control I think that still can be of utmost importance. We will see patient back for reevaluation in 1 week here in the clinic. If anything worsens or changes patient will contact our office for additional recommendations. Electronic Signature(s) Signed: 04/27/2022 4:59:58 PM By: Worthy Keeler PA-C Entered By: Worthy Keeler on 04/27/2022 16:59:58 -------------------------------------------------------------------------------- SuperBill Details Patient Name: Date of Service: Kristen [name], Kristen [name] 04/27/2022 Medical  Record Number: [id number] Patient Account Number: [id number] Date of Birth/Sex: Treating RN: 1941-05-29 (81 y.o. Kristen [name] Primary Care Provider: Cipriano Mile Other Clinician: Referring Provider: Treating Provider/Extender: Jolyne Loa Weeks in Treatment: 2 Diagnosis Coding ICD-10 Codes Code Description [phone] Abrasion, right lower leg, subsequent encounter L97.818 Non-pressure chronic ulcer of other part of right lower leg with other specified severity I89.0 Lymphedema, not elsewhere classified I87.323 Chronic venous hypertension (idiopathic)  with inflammation of bilateral lower extremity Facility Procedures CPT4 Code: [id number] Description: (Facility Use Only) [id number] - [facility] RT LEG Modifier: Quantity: 1 Physician Procedures : CPT4 Code Description Modifier [id number] - WC PHYS LEVEL 3 - EST PT ICD-10 Diagnosis Description S80.811D Abrasion, right lower leg, subsequent encounter L97.818 Non-pressure chronic ulcer of other part of right lower leg with other specified  severity I89.0 Lymphedema, not elsewhere classified I87.323 Chronic venous hypertension (idiopathic) with inflammation of bilateral lower extremity Quantity: 1 Electronic Signature(s) Signed: 04/27/2022 5:06:01 PM By: Worthy Keeler PA-C Previous Signature: 04/27/2022 4:19:27 PM Version By: Deon Pilling RN, BSN Entered By: Worthy Keeler on 04/27/2022 17:06:00

## 2022-05-04 ENCOUNTER — Encounter (HOSPITAL_BASED_OUTPATIENT_CLINIC_OR_DEPARTMENT_OTHER): Payer: Medicare Other | Admitting: Physician Assistant

## 2022-05-04 DIAGNOSIS — L97818 Non-pressure chronic ulcer of other part of right lower leg with other specified severity: Secondary | ICD-10-CM | POA: Diagnosis not present

## 2022-05-05 ENCOUNTER — Other Ambulatory Visit: Payer: Medicare Other

## 2022-05-11 ENCOUNTER — Encounter (HOSPITAL_BASED_OUTPATIENT_CLINIC_OR_DEPARTMENT_OTHER): Payer: Medicare Other | Attending: Physician Assistant | Admitting: Physician Assistant

## 2022-05-11 DIAGNOSIS — I890 Lymphedema, not elsewhere classified: Secondary | ICD-10-CM | POA: Diagnosis not present

## 2022-05-11 DIAGNOSIS — I5032 Chronic diastolic (congestive) heart failure: Secondary | ICD-10-CM | POA: Diagnosis not present

## 2022-05-11 DIAGNOSIS — Z7901 Long term (current) use of anticoagulants: Secondary | ICD-10-CM | POA: Insufficient documentation

## 2022-05-11 DIAGNOSIS — I872 Venous insufficiency (chronic) (peripheral): Secondary | ICD-10-CM | POA: Insufficient documentation

## 2022-05-11 DIAGNOSIS — M109 Gout, unspecified: Secondary | ICD-10-CM | POA: Insufficient documentation

## 2022-05-11 DIAGNOSIS — I87323 Chronic venous hypertension (idiopathic) with inflammation of bilateral lower extremity: Secondary | ICD-10-CM | POA: Insufficient documentation

## 2022-05-11 DIAGNOSIS — S80811A Abrasion, right lower leg, initial encounter: Secondary | ICD-10-CM | POA: Diagnosis not present

## 2022-05-11 DIAGNOSIS — M419 Scoliosis, unspecified: Secondary | ICD-10-CM | POA: Diagnosis not present

## 2022-05-11 DIAGNOSIS — W19XXXA Unspecified fall, initial encounter: Secondary | ICD-10-CM | POA: Insufficient documentation

## 2022-05-11 DIAGNOSIS — N183 Chronic kidney disease, stage 3 unspecified: Secondary | ICD-10-CM | POA: Insufficient documentation

## 2022-05-11 DIAGNOSIS — I4891 Unspecified atrial fibrillation: Secondary | ICD-10-CM | POA: Insufficient documentation

## 2022-05-11 DIAGNOSIS — L97818 Non-pressure chronic ulcer of other part of right lower leg with other specified severity: Secondary | ICD-10-CM | POA: Insufficient documentation

## 2022-05-11 DIAGNOSIS — E785 Hyperlipidemia, unspecified: Secondary | ICD-10-CM | POA: Diagnosis not present

## 2022-05-11 NOTE — Progress Notes (Addendum)
HADIA, MINIER ([id number]) Visit Report for 05/11/2022 Chief Complaint Document Details Patient Name: Date of Service: Kristen [name], Kristen [name] 05/11/2022 1:30 PM Medical Record Number: [id number] Patient Account Number: [id number] Date of Birth/Sex: Treating RN: 01/19/1941 (81 y.o. Kristen [name] Primary Care Provider: Cipriano Mile Other Clinician: Referring Provider: Treating Provider/Extender: Jolyne Loa Weeks in Treatment: 4 Information Obtained from: Patient Chief Complaint 04/13/2022; patient is here for review of a wound on her right lateral lower leg Electronic Signature(s) Signed: 05/11/2022 1:46:48 PM By: Worthy Keeler PA-C Entered By: Worthy Keeler on 05/11/2022 13:46:48 -------------------------------------------------------------------------------- HPI Details Patient Name: Date of Service: Kristen [name] 05/11/2022 1:30 PM Medical Record Number: [id number] Patient Account Number: [id number] Date of Birth/Sex: Treating RN: June 30, 1941 (81 y.o. Kristen [name] Primary Care Provider: Cipriano Mile Other Clinician: Referring Provider: Treating Provider/Extender: Jolyne Loa Weeks in Treatment: 4 History of Present Illness HPI Description: ADMISSION 04/13/2022 This is an 81 year old woman who lives on her own. She has a longstanding history of edema in her legs saw Dr. Doren Custard of vein and vascular on 01/09/2022. He diagnosed her with chronic venous insufficiency and lymphedema prescribed stockings. She did have venous studies that showed reflux in the right common femoral vein. The great saphenous vein was unable to be visualized no evidence of DVT or SVT For 1 reason or another the patient did not get the stockings. Unfortunately last week she suffered a fall and had a fairly clear deep skin tear on the right lateral lower leg. She is here for our review of this. She was put on clindamycin last week I think by her primary doctor and referred  here. Past medical history includes hyperlipidemia, lower extremity edema, hiatal hernia, atrial fib on Eliquis, ankle fracture, diastolic congestive heart failure, chronic kidney disease stage III, gout, scoliosis ABIs in our clinic were noncompressible bilaterally. Readmission: 04-20-2022 upon evaluation today patient appears to be doing well currently in regard to her wound in my opinion. She is actually showing signs of significant improvement which is great news. Fortunately I do not see any evidence of active infection locally or systemically at this time. No fevers, chills, nausea, vomiting, or diarrhea. 04-27-2022 upon evaluation today patient's wound is actually showing signs of improvement based on what I am seeing today the measurements are already smaller and very pleased in that regard. Fortunately I do not see any evidence of active infection locally or systemically which is great news and overall I think that we are on the right track here. No fevers, chills, nausea, vomiting, or diarrhea. 05-04-2022 upon evaluation today patient's wound is actually significantly smaller even compared to last week. We will making excellent progress and very pleased. I see no signs of active infection she did better with the Xeroform this week. 5-23 upon evaluation today patient's wound is actually very close to complete resolution. Fortunately I do not-see any evidence of active infection locally or systemically which is great news and overall I am extremely pleased with where we stand currently. No fevers, chills, nausea, vomiting, or diarrhea. Electronic Signature(s) Signed: 05/11/2022 3:00:50 PM By: Worthy Keeler PA-C Entered By: Worthy Keeler on 05/11/2022 15:00:50 -------------------------------------------------------------------------------- Physical Exam Details Patient Name: Date of Service: Kristen [name], Kristen [name] 05/11/2022 1:30 PM Medical Record Number: [id number] Patient Account Number:  [id number] Date of Birth/Sex: Treating RN: 1941/10/24 (81 y.o. Kristen [name] Primary Care Provider: Cipriano Mile Other Clinician: Referring Provider: Treating Provider/Extender: Oneida Arenas, Rachel Moulds  Weeks in Treatment: 4 Constitutional Well-nourished and well-hydrated in no acute distress. Respiratory normal breathing without difficulty. Psychiatric this patient is able to make decisions and demonstrates good insight into disease process. Alert and Oriented x 3. pleasant and cooperative. Notes Patient's wound bed showed signs of good granulation and epithelization at this point. Fortunately I do not see any evidence of infection locally or systemically which is great news and overall I am extremely pleased with where things stand at this time. Electronic Signature(s) Signed: 05/11/2022 3:01:10 PM By: Worthy Keeler PA-C Entered By: Worthy Keeler on 05/11/2022 15:01:10 -------------------------------------------------------------------------------- Physician Orders Details Patient Name: Date of Service: Kristen [name], Kristen [name] 05/11/2022 1:30 PM Medical Record Number: [id number] Patient Account Number: [id number] Date of Birth/Sex: Treating RN: 1941-01-15 (81 y.o. [name] Primary Care Provider: Cipriano Mile Other Clinician: Referring Provider: Treating Provider/Extender: Jolyne Loa Weeks in Treatment: 4 Verbal / Phone Orders: No Diagnosis Coding ICD-10 Coding Code Description S80.811D Abrasion, right lower leg, subsequent encounter L97.818 Non-pressure chronic ulcer of other part of right lower leg with other specified severity I89.0 Lymphedema, not elsewhere classified I87.323 Chronic venous hypertension (idiopathic) with inflammation of bilateral lower extremity Follow-up Appointments ppointment in 1 week. [name], PA and [place], Room 8 Wednesday 05/18/2022 215pm Return A Bathing/ Shower/ Hygiene May shower with protection but do not  get wound dressing(s) wet. Edema Control - Lymphedema / SCD / Other Elevate legs to the level of the heart or above for 30 minutes daily and/or when sitting, a frequency of: - 3-4 times a day throughout the day. Avoid standing for long periods of time. Exercise regularly Moisturize legs daily. - left every night before bed. Compression stocking or Garment 30-40 mm/Hg pressure to: - wear to left leg apply in the morning and remove at night. Follow up with Dr. Scot Dock for the stockings he ordered for you. Wound Treatment Wound #1 - Lower Leg Wound Laterality: Right, Lateral Cleanser: Soap and Water 1 x Per Week/30 Days Discharge Instructions: May shower and wash wound with dial antibacterial soap and water prior to dressing change. Cleanser: Wound Cleanser 1 x Per Week/30 Days Discharge Instructions: Cleanse the wound with wound cleanser prior to applying a clean dressing using gauze sponges, not tissue or cotton balls. Peri-Wound Care: Sween Lotion (Moisturizing lotion) 1 x Per Week/30 Days Discharge Instructions: Apply moisturizing lotion as directed Prim Dressing: Xeroform Occlusive Gauze Dressing, 4x4 in 1 x Per Week/30 Days ary Discharge Instructions: double the xeroform and cut slits in the xeroform apply to wound bed. Secondary Dressing: ABD Pad, 8x10 1 x Per Week/30 Days Discharge Instructions: Apply over primary dressing as directed. Secondary Dressing: Optifoam Non-Adhesive Dressing, 4x4 in 1 x Per Week/30 Days Discharge Instructions: Apply AT ANTERIOR PORTION OF ANKLE TO PROTECT THE ANKLE. Secondary Dressing: Woven Gauze Sponge, Non-Sterile 4x4 in 1 x Per Week/30 Days Discharge Instructions: Apply over primary dressing as directed. Compression Wrap: ThreePress (3 layer compression wrap) 1 x Per Week/30 Days Discharge Instructions: Apply three layer compression ADD UNNA BOOT FIRST LAYER TO BASE OF TOES. USE KERLIX INSTEAD OF COTTON. ***ENSURE TO WRAP AT THE BASE OF THE  TOES.*** Electronic Signature(s) Signed: 05/11/2022 4:22:37 PM By: Worthy Keeler PA-C Signed: 05/11/2022 4:36:02 PM By: Sharyn Creamer RN, BSN Entered By: Sharyn Creamer on 05/11/2022 14:36:57 -------------------------------------------------------------------------------- Problem List Details Patient Name: Date of Service: Kristen [name] 05/11/2022 1:30 PM Medical Record Number: [id number] Patient Account Number: [id number] Date of Birth/Sex: Treating RN:  03-03-1941 (81 y.o. Kristen [name] Primary Care Provider: Cipriano Mile Other Clinician: Referring Provider: Treating Provider/Extender: Jolyne Loa Weeks in Treatment: 4 Active Problems ICD-10 Encounter Code Description Active Date MDM Diagnosis S80.811D Abrasion, right lower leg, subsequent encounter 04/13/2022 No Yes L97.818 Non-pressure chronic ulcer of other part of right lower leg with other specified 04/13/2022 No Yes severity I89.0 Lymphedema, not elsewhere classified 04/13/2022 No Yes I87.323 Chronic venous hypertension (idiopathic) with inflammation of bilateral lower 04/13/2022 No Yes extremity Inactive Problems Resolved Problems Electronic Signature(s) Signed: 05/11/2022 1:46:39 PM By: Worthy Keeler PA-C Entered By: Worthy Keeler on 05/11/2022 13:46:39 -------------------------------------------------------------------------------- Progress Note Details Patient Name: Date of Service: Kristen [name] 05/11/2022 1:30 PM Medical Record Number: [id number] Patient Account Number: [id number] Date of Birth/Sex: Treating RN: 1941-03-26 (81 y.o. Kristen [name] Primary Care Provider: Cipriano Mile Other Clinician: Referring Provider: Treating Provider/Extender: Jolyne Loa Weeks in Treatment: 4 Subjective Chief Complaint Information obtained from Patient 04/13/2022; patient is here for review of a wound on her right lateral lower leg History of Present Illness  (HPI) ADMISSION 04/13/2022 This is an 81 year old woman who lives on her own. She has a longstanding history of edema in her legs saw Dr. Doren Custard of vein and vascular on 01/09/2022. He diagnosed her with chronic venous insufficiency and lymphedema prescribed stockings. She did have venous studies that showed reflux in the right common femoral vein. The great saphenous vein was unable to be visualized no evidence of DVT or SVT For 1 reason or another the patient did not get the stockings. Unfortunately last week she suffered a fall and had a fairly clear deep skin tear on the right lateral lower leg. She is here for our review of this. She was put on clindamycin last week I think by her primary doctor and referred here. Past medical history includes hyperlipidemia, lower extremity edema, hiatal hernia, atrial fib on Eliquis, ankle fracture, diastolic congestive heart failure, chronic kidney disease stage III, gout, scoliosis ABIs in our clinic were noncompressible bilaterally. Readmission: 04-20-2022 upon evaluation today patient appears to be doing well currently in regard to her wound in my opinion. She is actually showing signs of significant improvement which is great news. Fortunately I do not see any evidence of active infection locally or systemically at this time. No fevers, chills, nausea, vomiting, or diarrhea. 04-27-2022 upon evaluation today patient's wound is actually showing signs of improvement based on what I am seeing today the measurements are already smaller and very pleased in that regard. Fortunately I do not see any evidence of active infection locally or systemically which is great news and overall I think that we are on the right track here. No fevers, chills, nausea, vomiting, or diarrhea. 05-04-2022 upon evaluation today patient's wound is actually significantly smaller even compared to last week. We will making excellent progress and very pleased. I see no signs of active  infection she did better with the Xeroform this week. 5-23 upon evaluation today patient's wound is actually very close to complete resolution. Fortunately I do not-see any evidence of active infection locally or systemically which is great news and overall I am extremely pleased with where we stand currently. No fevers, chills, nausea, vomiting, or diarrhea. Objective Constitutional Well-nourished and well-hydrated in no acute distress. Vitals Time Taken: 1:55 PM, Temperature: 98.3 F, Pulse: 103 bpm, Respiratory Rate: 18 breaths/min, Blood Pressure: 114/78 mmHg. Respiratory normal breathing without difficulty. Psychiatric this patient is able to make  decisions and demonstrates good insight into disease process. Alert and Oriented x 3. pleasant and cooperative. General Notes: Patient's wound bed showed signs of good granulation and epithelization at this point. Fortunately I do not see any evidence of infection locally or systemically which is great news and overall I am extremely pleased with where things stand at this time. Integumentary (Hair, Skin) Wound #1 status is Open. Original cause of wound was Laceration. The date acquired was: 04/06/2022. The wound has been in treatment 4 weeks. The wound is located on the Right,Lateral Lower Leg. The wound measures 0.6cm length x 0.5cm width x 0.1cm depth; 0.236cm^2 area and 0.024cm^3 volume. There is Fat Layer (Subcutaneous Tissue) exposed. There is no tunneling or undermining noted. There is a medium amount of serosanguineous drainage noted. The wound margin is distinct with the outline attached to the wound base. There is large (67-100%) red, hyper - granulation within the wound bed. There is no necrotic tissue within the wound bed. Assessment Active Problems ICD-10 Abrasion, right lower leg, subsequent encounter Non-pressure chronic ulcer of other part of right lower leg with other specified severity Lymphedema, not elsewhere  classified Chronic venous hypertension (idiopathic) with inflammation of bilateral lower extremity Procedures Wound #1 Pre-procedure diagnosis of Wound #1 is an Abrasion located on the Right,Lateral Lower Leg . There was a Three Layer Compression Therapy Procedure by Sharyn Creamer, RN. Post procedure Diagnosis Wound #1: Same as Pre-Procedure Plan Follow-up Appointments: Return Appointment in 1 week. [name], PA and [place], Room 8 Wednesday 05/18/2022 215pm Bathing/ Shower/ Hygiene: May shower with protection but do not get wound dressing(s) wet. Edema Control - Lymphedema / SCD / Other: Elevate legs to the level of the heart or above for 30 minutes daily and/or when sitting, a frequency of: - 3-4 times a day throughout the day. Avoid standing for long periods of time. Exercise regularly Moisturize legs daily. - left every night before bed. Compression stocking or Garment 30-40 mm/Hg pressure to: - wear to left leg apply in the morning and remove at night. Follow up with Dr. Scot Dock for the stockings he ordered for you. WOUND #1: - Lower Leg Wound Laterality: Right, Lateral Cleanser: Soap and Water 1 x Per Week/30 Days Discharge Instructions: May shower and wash wound with dial antibacterial soap and water prior to dressing change. Cleanser: Wound Cleanser 1 x Per Week/30 Days Discharge Instructions: Cleanse the wound with wound cleanser prior to applying a clean dressing using gauze sponges, not tissue or cotton balls. Peri-Wound Care: Sween Lotion (Moisturizing lotion) 1 x Per Week/30 Days Discharge Instructions: Apply moisturizing lotion as directed Prim Dressing: Xeroform Occlusive Gauze Dressing, 4x4 in 1 x Per Week/30 Days ary Discharge Instructions: double the xeroform and cut slits in the xeroform apply to wound bed. Secondary Dressing: ABD Pad, 8x10 1 x Per Week/30 Days Discharge Instructions: Apply over primary dressing as directed. Secondary Dressing: Optifoam  Non-Adhesive Dressing, 4x4 in 1 x Per Week/30 Days Discharge Instructions: Apply AT ANTERIOR PORTION OF ANKLE TO PROTECT THE ANKLE. Secondary Dressing: Woven Gauze Sponge, Non-Sterile 4x4 in 1 x Per Week/30 Days Discharge Instructions: Apply over primary dressing as directed. Com pression Wrap: ThreePress (3 layer compression wrap) 1 x Per Week/30 Days Discharge Instructions: Apply three layer compression ADD UNNA BOOT FIRST LAYER TO BASE OF TOES. USE KERLIX INSTEAD OF COTTON. ***ENSURE TO WRAP AT THE BASE OF THE TOES.*** 1. I am good recommend that we go ahead and continue with the wound care measures  as before and the patient is in agreement with that plan. This includes the use of the Xeroform gauze dressing which I think is still probably can to be the best way to go. 2. Also can recommend that we have the patient continue with a 3 layer compression wrap which is doing well. 3. I am also going to suggest that we use the Tubigrip over the end of the foot in order to help keep the swelling down I think this should be beneficial in helping in that regard the patient is in agreement the plan we will see how things appear next week. We will see patient back for reevaluation in 1 week here in the clinic. If anything worsens or changes patient will contact our office for additional recommendations. Electronic Signature(s) Signed: 05/11/2022 3:01:52 PM By: Worthy Keeler PA-C Entered By: Worthy Keeler on 05/11/2022 15:01:52 -------------------------------------------------------------------------------- SuperBill Details Patient Name: Date of Service: Kristen [name], Kristen [name] 05/11/2022 Medical Record Number: [id number] Patient Account Number: [id number] Date of Birth/Sex: Treating RN: [birth year] (81 y.o. Kristen [name], Kristen [name] Primary Care Provider: Cipriano Mile Other Clinician: Referring Provider: Treating Provider/Extender: Jolyne Loa Weeks in Treatment: 4 Diagnosis Coding ICD-10  Codes Code Description [phone] Abrasion, right lower leg, subsequent encounter L97.818 Non-pressure chronic ulcer of other part of right lower leg with other specified severity I89.0 Lymphedema, not elsewhere classified I87.323 Chronic venous hypertension (idiopathic) with inflammation of bilateral lower extremity Facility Procedures CPT4 Code: [id number] Description: (Facility Use Only) [id number] - Oil Trough RT LEG ICD-10 Diagnosis Description S80.811D Abrasion, right lower leg, subsequent encounter I87.323 Chronic venous hypertension (idiopathic) with inflammation of bilateral lower  extremity I89.0 Lymphedema, not elsewhere classified L97.818 Non-pressure chronic ulcer of other part of right lower leg with other specified severi Modifier: ty Quantity: 1 Physician Procedures : CPT4 Code Description Modifier [id number] - WC PHYS LEVEL 3 - EST PT ICD-10 Diagnosis Description L97.818 Non-pressure chronic ulcer of other part of right lower leg with other specified severity S80.811D Abrasion, right lower leg, subsequent  encounter I89.0 Lymphedema, not elsewhere classified I87.323 Chronic venous hypertension (idiopathic) with inflammation of bilateral lower extremity Quantity: 1 Electronic Signature(s) Signed: 05/11/2022 4:22:37 PM By: Worthy Keeler PA-C Signed: 05/11/2022 4:36:02 PM By: Sharyn Creamer RN, BSN Previous Signature: 05/11/2022 3:06:24 PM Version By: Worthy Keeler PA-C Entered By: Sharyn Creamer on 05/11/2022 16:15:01

## 2022-05-11 NOTE — Progress Notes (Signed)
RAECHELLE, SARTI ([id number]) Visit Report for 05/11/2022 Arrival Information Details Patient Name: Date of Service: Kristen, [name] 05/11/2022 1:30 PM Medical Record Number: [id number] Patient Account Number: [id number] Date of Birth/Sex: Treating RN: Jul 06, 1941 (81 y.o. Kristen [name] Primary Care Dariel Pellecchia: Cipriano Mile Other Clinician: Referring Nicki Furlan: Treating Josemanuel Eakins/Extender: Jolyne Loa Weeks in Treatment: 4 Visit Information History Since Last Visit Added or deleted any medications: No Patient Arrived: Ambulatory Any new allergies or adverse reactions: No Arrival Time: 13:53 Had a fall or experienced change in No Accompanied By: self activities of daily living that may affect Transfer Assistance: None risk of falls: Patient Identification Verified: Yes Signs or symptoms of abuse/neglect since last visito No Secondary Verification Process Completed: Yes Hospitalized since last visit: No Patient Requires Transmission-Based Precautions: No Implantable device outside of the clinic excluding No Patient Has Alerts: Yes cellular tissue based products placed in the center Patient Alerts: [place] left ABI 04/13/2022 since last visit: Has Dressing in Place as Prescribed: Yes Pain Present Now: No Electronic Signature(s) Signed: 05/11/2022 4:36:02 PM By: Sharyn Creamer RN, BSN Entered By: Sharyn Creamer on 05/11/2022 13:54:40 -------------------------------------------------------------------------------- Compression Therapy Details Patient Name: Date of Service: Kristen [name] 05/11/2022 1:30 PM Medical Record Number: [id number] Patient Account Number: [id number] Date of Birth/Sex: Treating RN: 06-Sep-1941 (81 y.o. Kristen [name] Primary Care Xaria Judon: Cipriano Mile Other Clinician: Referring Marquarius Lofton: Treating Tyrann Donaho/Extender: Jolyne Loa Weeks in Treatment: 4 Compression Therapy Performed for Wound Assessment: Wound #1 Right,Lateral  Lower Leg Performed By: Clinician Sharyn Creamer, RN Compression Type: Three Layer Post Procedure Diagnosis Same as Pre-procedure Electronic Signature(s) Signed: 05/11/2022 4:36:02 PM By: Sharyn Creamer RN, BSN Entered By: Sharyn Creamer on 05/11/2022 14:38:52 -------------------------------------------------------------------------------- Encounter Discharge Information Details Patient Name: Date of Service: Kristen [name] 05/11/2022 1:30 PM Medical Record Number: [id number] Patient Account Number: [id number] Date of Birth/Sex: Treating RN: 1941-09-03 (81 y.o. Kristen [name] Primary Care Delmore Sear: Cipriano Mile Other Clinician: Referring Adelard Sanon: Treating Dakiya Puopolo/Extender: Jolyne Loa Weeks in Treatment: 4 Encounter Discharge Information Items Discharge Condition: Stable Ambulatory Status: Ambulatory Discharge Destination: Home Transportation: Private Auto Accompanied By: self Schedule Follow-up Appointment: Yes Clinical Summary of Care: Patient Declined Electronic Signature(s) Signed: 05/11/2022 4:36:02 PM By: Sharyn Creamer RN, BSN Entered By: Sharyn Creamer on 05/11/2022 14:37:36 -------------------------------------------------------------------------------- Lower Extremity Assessment Details Patient Name: Date of Service: Kristen [name] 05/11/2022 1:30 PM Medical Record Number: [id number] Patient Account Number: [id number] Date of Birth/Sex: Treating RN: [birth year] (81 y.o. Kristen [name] Primary Care Storie Heffern: Cipriano Mile Other Clinician: Referring Brynden Thune: Treating Shawnell Dykes/Extender: Jolyne Loa Weeks in Treatment: 4 Edema Assessment Assessed: [Left: No] [Right: No] Edema: [Left: Ye] [Right: s] Calf Left: Right: Point of Measurement: 28 cm From Medial Instep 34 cm Ankle Left: Right: Point of Measurement: 10 cm From Medial Instep 24.3 cm Vascular Assessment Pulses: Dorsalis Pedis Palpable:  [Right:Yes] Electronic Signature(s) Signed: 05/11/2022 4:36:02 PM By: Sharyn Creamer RN, BSN Entered By: Sharyn Creamer on 05/11/2022 14:03:25 -------------------------------------------------------------------------------- [place] Details Patient Name: Date of Service: Kristen [name] 05/11/2022 1:30 PM Medical Record Number: [id number] Patient Account Number: [id number] Date of Birth/Sex: Treating RN: 18-Oct-1941 (81 y.o. Kristen [name] Primary Care Karley Pho: Cipriano Mile Other Clinician: Referring Kay Shippy: Treating Jama Krichbaum/Extender: Jolyne Loa Weeks in Treatment: 4 Active Inactive Pain, Acute or Chronic Nursing Diagnoses: Pain, acute or chronic: actual or potential Potential alteration in comfort, pain Goals: Patient will verbalize adequate pain control and  receive pain control interventions during procedures as needed Date Initiated: 04/13/2022 Target Resolution Date: 06/03/2022 Goal Status: Active Patient/caregiver will verbalize comfort level met Date Initiated: 04/13/2022 Target Resolution Date: 06/03/2022 Goal Status: Active Interventions: Encourage patient to take pain medications as prescribed Provide education on pain management Reposition patient for comfort Treatment Activities: Administer pain control measures as ordered : 04/13/2022 Notes: Wound/Skin Impairment Nursing Diagnoses: Knowledge deficit related to ulceration/compromised skin integrity Goals: Patient/caregiver will verbalize understanding of skin care regimen Date Initiated: 04/13/2022 Target Resolution Date: 06/03/2022 Goal Status: Active Interventions: Assess patient/caregiver ability to perform ulcer/skin care regimen upon admission and as needed Assess ulceration(s) every visit Provide education on ulcer and skin care Treatment Activities: Skin care regimen initiated : 04/13/2022 Topical wound management initiated : 04/13/2022 Notes: Electronic  Signature(s) Signed: 05/11/2022 4:36:02 PM By: Sharyn Creamer RN, BSN Entered By: Sharyn Creamer on 05/11/2022 14:35:13 -------------------------------------------------------------------------------- Pain Assessment Details Patient Name: Date of Service: Kristen [name] 05/11/2022 1:30 PM Medical Record Number: [id number] Patient Account Number: [id number] Date of Birth/Sex: Treating RN: 03/23/1941 (81 y.o. Kristen [name] Primary Care Genesia Caslin: Cipriano Mile Other Clinician: Referring Cuca Benassi: Treating Mackenzie Groom/Extender: Jolyne Loa Weeks in Treatment: 4 Active Problems Location of Pain Severity and Description of Pain Patient Has Paino No Site Locations Pain Management and Medication Current Pain Management: Electronic Signature(s) Signed: 05/11/2022 4:36:02 PM By: Sharyn Creamer RN, BSN Entered By: Sharyn Creamer on 05/11/2022 14:03:32 -------------------------------------------------------------------------------- Patient/Caregiver Education Details Patient Name: Date of Service: Kristen [name] 7/5/2023andnbsp1:30 PM Medical Record Number: [id number] Patient Account Number: [id number] Date of Birth/Gender: Treating RN: [birth year] (81 y.o. Kristen [name] Primary Care Physician: Cipriano Mile Other Clinician: Referring Physician: Treating Physician/Extender: Jolyne Loa Weeks in Treatment: 4 Education Assessment Education Provided To: Patient Education Topics Provided Wound/Skin Impairment: Methods: Explain/Verbal Responses: State content correctly Electronic Signature(s) Signed: 05/11/2022 4:36:02 PM By: Sharyn Creamer RN, BSN Entered By: Sharyn Creamer on 05/11/2022 14:35:33 -------------------------------------------------------------------------------- Wound Assessment Details Patient Name: Date of Service: Kristen [name] 05/11/2022 1:30 PM Medical Record Number: [id number] Patient Account Number: [id number] Date  of Birth/Sex: Treating RN: 09-01-1941 (81 y.o. Kristen [name] Primary Care Chaka Jefferys: Cipriano Mile Other Clinician: Referring Cassey Bacigalupo: Treating Pearlie Lafosse/Extender: Jolyne Loa Weeks in Treatment: 4 Wound Status Wound Number: 1 Primary Abrasion Etiology: Wound Location: Right, Lateral Lower Leg Secondary Lymphedema Wounding Event: Laceration Etiology: Date Acquired: 04/06/2022 Wound Open Weeks Of Treatment: 4 Status: Clustered Wound: No Comorbid Cataracts, Arrhythmia, Congestive Heart Failure, Hypertension, History: Peripheral Venous Disease, Osteoarthritis Photos Wound Measurements Length: (cm) 0.6 Width: (cm) 0.5 Depth: (cm) 0.1 Area: (cm) 0.236 Volume: (cm) 0.024 % Reduction in Area: 98% % Reduction in Volume: 98% Epithelialization: Large (67-100%) Tunneling: No Undermining: No Wound Description Classification: Full Thickness Without Exposed Support Structures Wound Margin: Distinct, outline attached Exudate Amount: Medium Exudate Type: Serosanguineous Exudate Color: red, brown Foul Odor After Cleansing: No Slough/Fibrino Yes Wound Bed Granulation Amount: Large (67-100%) Exposed Structure Granulation Quality: Red, Hyper-granulation Fascia Exposed: No Necrotic Amount: None Present (0%) Fat Layer (Subcutaneous Tissue) Exposed: Yes Tendon Exposed: No Muscle Exposed: No Joint Exposed: No Bone Exposed: No Treatment Notes Wound #1 (Lower Leg) Wound Laterality: Right, Lateral Cleanser Soap and Water Discharge Instruction: May shower and wash wound with dial antibacterial soap and water prior to dressing change. Wound Cleanser Discharge Instruction: Cleanse the wound with wound cleanser prior to applying a clean dressing using gauze sponges, not tissue or cotton balls. Peri-Wound Care Sween Lotion (Moisturizing lotion)  Discharge Instruction: Apply moisturizing lotion as directed Topical Primary Dressing Xeroform Occlusive Gauze  Dressing, 4x4 in Discharge Instruction: double the xeroform and cut slits in the xeroform apply to wound bed. Secondary Dressing ABD Pad, 8x10 Discharge Instruction: Apply over primary dressing as directed. Optifoam Non-Adhesive Dressing, 4x4 in Discharge Instruction: Apply AT ANTERIOR PORTION OF ANKLE TO PROTECT THE ANKLE. Woven Gauze Sponge, Non-Sterile 4x4 in Discharge Instruction: Apply over primary dressing as directed. Secured With Compression Wrap ThreePress (3 layer compression wrap) Discharge Instruction: Apply three layer compression ADD UNNA BOOT FIRST LAYER TO BASE OF TOES. USE KERLIX INSTEAD OF COTTON. ***ENSURE TO WRAP AT THE BASE OF THE TOES.*** Compression Stockings Add-Ons Electronic Signature(s) Signed: 05/11/2022 4:36:02 PM By: Sharyn Creamer RN, BSN Entered By: Sharyn Creamer on 05/11/2022 14:06:04 -------------------------------------------------------------------------------- [place] Details Patient Name: Date of Service: Kristen [name]. 05/11/2022 1:30 PM Medical Record Number: [id number] Patient Account Number: [id number] Date of Birth/Sex: Treating RN: 11-17-1940 (81 y.o. Kristen [name] Primary Care Dunbar Buras: Cipriano Mile Other Clinician: Referring Christiano Blandon: Treating Eulan Heyward/Extender: Jolyne Loa Weeks in Treatment: 4 Vital Signs Time Taken: 13:55 Temperature (F): 98.3 Pulse (bpm): 103 Respiratory Rate (breaths/min): 18 Blood Pressure (mmHg): 114/78 Reference Range: 80 - 120 mg / dl Electronic Signature(s) Signed: 05/11/2022 4:36:02 PM By: Sharyn Creamer RN, BSN Entered By: Sharyn Creamer on 05/11/2022 13:57:06

## 2022-05-18 ENCOUNTER — Other Ambulatory Visit: Payer: Self-pay | Admitting: Student

## 2022-05-18 ENCOUNTER — Encounter (HOSPITAL_BASED_OUTPATIENT_CLINIC_OR_DEPARTMENT_OTHER): Payer: Medicare Other | Admitting: Physician Assistant

## 2022-05-18 DIAGNOSIS — I87323 Chronic venous hypertension (idiopathic) with inflammation of bilateral lower extremity: Secondary | ICD-10-CM | POA: Diagnosis not present

## 2022-05-18 DIAGNOSIS — Z780 Asymptomatic menopausal state: Secondary | ICD-10-CM

## 2022-05-18 NOTE — Progress Notes (Signed)
[name], [name] ([id number]) Visit Report for 05/18/2022 Arrival Information Details Patient Name: Date of Service: Kristen [name], Kristen [name] 05/18/2022 2:15 PM Medical Record Number: [id number] Patient Account Number: [id number] Date of Birth/Sex: Treating RN: 05/02/1941 (81 y.o. [name], [name] Primary Care Koji Niehoff: Cipriano Mile Other Clinician: Referring Loise Esguerra: Treating Britiney Blahnik/Extender: Jolyne Loa Weeks in Treatment: 5 Visit Information History Since Last Visit Added or deleted any medications: No Patient Arrived: Ambulatory Any new allergies or adverse reactions: No Arrival Time: 14:30 Had a fall or experienced change in No Accompanied By: self activities of daily living that may affect Transfer Assistance: None risk of falls: Patient Identification Verified: Yes Signs or symptoms of abuse/neglect since No Secondary Verification Process Completed: Yes last visito Patient Requires Transmission-Based Precautions: No Hospitalized since last visit: No Patient Has Alerts: Yes Implantable device outside of the clinic No Patient Alerts: [place] left ABI 04/13/2022 excluding cellular tissue based products placed in the center since last visit: Has Dressing in Place as Prescribed: Yes Has Compression in Place as Prescribed: Yes Has Footwear/Offloading in Place as Yes Prescribed: Right: Surgical Shoe with Pressure Relief Insole Pain Present Now: No Electronic Signature(s) Signed: 05/18/2022 5:36:22 PM By: Deon Pilling RN, BSN Entered By: Deon Pilling on 05/18/2022 14:34:56 -------------------------------------------------------------------------------- Clinic Level of Care Assessment Details Patient Name: Date of Service: Kristen, [name] 05/18/2022 2:15 PM Medical Record Number: [id number] Patient Account Number: [id number] Date of Birth/Sex: Treating RN: [birth year] (81 y.o. [name], [name] Primary Care Gabrian Hoque: Cipriano Mile Other Clinician: Referring  Marion Rosenberry: Treating Seda Kronberg/Extender: Jolyne Loa Weeks in Treatment: 5 Clinic Level of Care Assessment Items TOOL 4 Quantity Score X- 1 0 Use when only an EandM is performed on FOLLOW-UP visit ASSESSMENTS - Nursing Assessment / Reassessment X- 1 10 Reassessment of Co-morbidities (includes updates in patient status) X- 1 5 Reassessment of Adherence to Treatment Plan ASSESSMENTS - Wound and Skin A ssessment / Reassessment X - Simple Wound Assessment / Reassessment - one wound 1 5 []  - 0 Complex Wound Assessment / Reassessment - multiple wounds X- 1 10 Dermatologic / Skin Assessment (not related to wound area) ASSESSMENTS - Focused Assessment X- 1 5 Circumferential Edema Measurements - multi extremities []  - 0 Nutritional Assessment / Counseling / Intervention []  - 0 Lower Extremity Assessment (monofilament, tuning fork, pulses) []  - 0 Peripheral Arterial Disease Assessment (using hand held doppler) ASSESSMENTS - Ostomy and/or Continence Assessment and Care []  - 0 Incontinence Assessment and Management []  - 0 Ostomy Care Assessment and Management (repouching, etc.) PROCESS - Coordination of Care X - Simple Patient / Family Education for ongoing care 1 15 []  - 0 Complex (extensive) Patient / Family Education for ongoing care X- 1 10 Staff obtains Programmer, systems, Records, T Results / Process Orders est []  - 0 Staff telephones HHA, Nursing Homes / Clarify orders / etc []  - 0 Routine Transfer to another Facility (non-emergent condition) []  - 0 Routine Hospital Admission (non-emergent condition) []  - 0 New Admissions / Biomedical engineer / Ordering NPWT Apligraf, etc. , []  - 0 Emergency Hospital Admission (emergent condition) X- 1 10 Simple Discharge Coordination []  - 0 Complex (extensive) Discharge Coordination PROCESS - Special Needs []  - 0 Pediatric / Minor Patient Management []  - 0 Isolation Patient Management []  - 0 Hearing / Language /  Visual special needs []  - 0 Assessment of Community assistance (transportation, D/C planning, etc.) []  - 0 Additional assistance / Altered mentation []  - 0 Support Surface(s) Assessment (bed, cushion,  seat, etc.) INTERVENTIONS - Wound Cleansing / Measurement X - Simple Wound Cleansing - one wound 1 5 []  - 0 Complex Wound Cleansing - multiple wounds X- 1 5 Wound Imaging (photographs - any number of wounds) []  - 0 Wound Tracing (instead of photographs) X- 1 5 Simple Wound Measurement - one wound []  - 0 Complex Wound Measurement - multiple wounds INTERVENTIONS - Wound Dressings []  - 0 Small Wound Dressing one or multiple wounds []  - 0 Medium Wound Dressing one or multiple wounds X- 1 20 Large Wound Dressing one or multiple wounds []  - 0 Application of Medications - topical []  - 0 Application of Medications - injection INTERVENTIONS - Miscellaneous []  - 0 External ear exam []  - 0 Specimen Collection (cultures, biopsies, blood, body fluids, etc.) []  - 0 Specimen(s) / Culture(s) sent or taken to Lab for analysis []  - 0 Patient Transfer (multiple staff / Civil Service fast streamer / Similar devices) []  - 0 Simple Staple / Suture removal (25 or less) []  - 0 Complex Staple / Suture removal (26 or more) []  - 0 Hypo / Hyperglycemic Management (close monitor of Blood Glucose) []  - 0 Ankle / Brachial Index (ABI) - do not check if billed separately X- 1 5 Vital Signs Has the patient been seen at the hospital within the last three years: Yes Total Score: 110 Level Of Care: New/Established - Level 3 Electronic Signature(s) Signed: 05/18/2022 5:36:22 PM By: Deon Pilling RN, BSN Entered By: Deon Pilling on 05/18/2022 15:32:15 -------------------------------------------------------------------------------- Encounter Discharge Information Details Patient Name: Date of Service: Kristen [name] 05/18/2022 2:15 PM Medical Record Number: [id number] Patient Account Number: [id number] Date of  Birth/Sex: Treating RN: 03-Feb-1941 (81 y.o. [name] Primary Care Andres Vest: Cipriano Mile Other Clinician: Referring Bernell Haynie: Treating Jaxson Keener/Extender: Jolyne Loa Weeks in Treatment: 5 Encounter Discharge Information Items Discharge Condition: Stable Ambulatory Status: Ambulatory Discharge Destination: Home Transportation: Private Auto Accompanied By: self Schedule Follow-up Appointment: Yes Clinical Summary of Care: Electronic Signature(s) Signed: 05/18/2022 5:36:22 PM By: Deon Pilling RN, BSN Entered By: Deon Pilling on 05/18/2022 15:32:49 -------------------------------------------------------------------------------- Lower Extremity Assessment Details Patient Name: Date of Service: Kristen [name] 05/18/2022 2:15 PM Medical Record Number: [id number] Patient Account Number: [id number] Date of Birth/Sex: Treating RN: 1941/10/12 (81 y.o. [name] Primary Care Daymian Lill: Cipriano Mile Other Clinician: Referring Nechelle Petrizzo: Treating Murray Durrell/Extender: Jolyne Loa Weeks in Treatment: 5 Edema Assessment Assessed: [Left: No] [Right: Yes] Edema: [Left: Ye] [Right: s] Calf Left: Right: Point of Measurement: 28 cm From Medial Instep 39 cm Ankle Left: Right: Point of Measurement: 10 cm From Medial Instep 21 cm Vascular Assessment Pulses: Dorsalis Pedis Palpable: [Right:Yes] Electronic Signature(s) Signed: 05/18/2022 5:36:22 PM By: Deon Pilling RN, BSN Entered By: Deon Pilling on 05/18/2022 14:37:50 -------------------------------------------------------------------------------- Multi-Disciplinary Care Plan Details Patient Name: Date of Service: Kristen [name] 05/18/2022 2:15 PM Medical Record Number: [id number] Patient Account Number: [id number] Date of Birth/Sex: Treating RN: 07-13-1941 (81 y.o. [name] Primary Care Adline Kirshenbaum: Cipriano Mile Other Clinician: Referring Dionte Blaustein: Treating Aadil Sur/Extender:  Jolyne Loa Weeks in Treatment: 5 Active Inactive Pain, Acute or Chronic Nursing Diagnoses: Pain, acute or chronic: actual or potential Potential alteration in comfort, pain Goals: Patient will verbalize adequate pain control and receive pain control interventions during procedures as needed Date Initiated: 04/13/2022 Date Inactivated: 05/18/2022 Target Resolution Date: 06/03/2022 Goal Status: Met Patient/caregiver will verbalize comfort level met Date Initiated: 04/13/2022 Target Resolution Date: 06/03/2022 Goal Status: Active Interventions: Encourage patient to  take pain medications as prescribed Provide education on pain management Reposition patient for comfort Treatment Activities: Administer pain control measures as ordered : 04/13/2022 Notes: Electronic Signature(s) Signed: 05/18/2022 5:36:22 PM By: Deon Pilling RN, BSN Entered By: Deon Pilling on 05/18/2022 15:08:45 -------------------------------------------------------------------------------- Pain Assessment Details Patient Name: Date of Service: Kristen [name] 05/18/2022 2:15 PM Medical Record Number: [id number] Patient Account Number: [id number] Date of Birth/Sex: Treating RN: [birth year] (81 y.o. [name] Primary Care Breckin Savannah: Cipriano Mile Other Clinician: Referring Tieasha Larsen: Treating Kendrik Mcshan/Extender: Jolyne Loa Weeks in Treatment: 5 Active Problems Location of Pain Severity and Description of Pain Patient Has Paino No Site Locations Rate the pain. Current Pain Level: 0 Pain Management and Medication Current Pain Management: Medication: No Cold Application: No Rest: No Massage: No Activity: No T.E.N.S.: No Heat Application: No Leg drop or elevation: No Is the Current Pain Management Adequate: Adequate How does your wound impact your activities of daily livingo Sleep: No Bathing: No Appetite: No Relationship With Others: No Bladder Continence:  No Emotions: No Bowel Continence: No Work: No Toileting: No Drive: No Dressing: No Hobbies: No Engineer, maintenance) Signed: 05/18/2022 5:36:22 PM By: Deon Pilling RN, BSN Entered By: Deon Pilling on 05/18/2022 14:35:18 -------------------------------------------------------------------------------- Patient/Caregiver Education Details Patient Name: Date of Service: Kristen [name] 7/12/2023andnbsp2:15 PM Medical Record Number: [id number] Patient Account Number: [id number] Date of Birth/Gender: Treating RN: 02/28/1941 (81 y.o. [name] Primary Care Physician: Cipriano Mile Other Clinician: Referring Physician: Treating Physician/Extender: Jolyne Loa Weeks in Treatment: 5 Education Assessment Education Provided To: Patient Education Topics Provided Wound/Skin Impairment: Handouts: Skin Care Do's and Dont's Methods: Explain/Verbal Responses: Reinforcements needed Electronic Signature(s) Signed: 05/18/2022 5:36:22 PM By: Deon Pilling RN, BSN Entered By: Deon Pilling on 05/18/2022 15:02:12 -------------------------------------------------------------------------------- Wound Assessment Details Patient Name: Date of Service: Kristen [name] 05/18/2022 2:15 PM Medical Record Number: [id number] Patient Account Number: [id number] Date of Birth/Sex: Treating RN: October 28, 1941 (81 y.o. [name], [name] Primary Care Daanish Copes: Cipriano Mile Other Clinician: Referring Eliette Drumwright: Treating Ladonya Jerkins/Extender: Jolyne Loa Weeks in Treatment: 5 Wound Status Wound Number: 1 Primary Abrasion Etiology: Wound Location: Right, Lateral Lower Leg Secondary Lymphedema Wounding Event: Laceration Etiology: Date Acquired: 04/06/2022 Wound Open Weeks Of Treatment: 5 Status: Clustered Wound: No Comorbid Cataracts, Arrhythmia, Congestive Heart Failure, Hypertension, History: Peripheral Venous Disease, Osteoarthritis Photos Wound  Measurements Length: (cm) Width: (cm) Depth: (cm) Area: (cm) Volume: (cm) 0 % Reduction in Area: 100% 0 % Reduction in Volume: 100% 0 Epithelialization: Large (67-100%) 0 Tunneling: No 0 Undermining: No Wound Description Classification: Full Thickness Without Exposed Support Structures Wound Margin: Distinct, outline attached Exudate Amount: None Present Foul Odor After Cleansing: No Slough/Fibrino No Wound Bed Granulation Amount: None Present (0%) Exposed Structure Necrotic Amount: None Present (0%) Fascia Exposed: No Fat Layer (Subcutaneous Tissue) Exposed: No Tendon Exposed: No Muscle Exposed: No Joint Exposed: No Bone Exposed: No Electronic Signature(s) Signed: 05/18/2022 5:36:22 PM By: Deon Pilling RN, BSN Entered By: Deon Pilling on 05/18/2022 14:41:22 -------------------------------------------------------------------------------- [place] Details Patient Name: Date of Service: Kristen [name] 05/18/2022 2:15 PM Medical Record Number: [id number] Patient Account Number: [id number] Date of Birth/Sex: Treating RN: [birth year] (81 y.o. [name], [name] Primary Care Kit Mollett: Cipriano Mile Other Clinician: Referring Maleko Greulich: Treating Willoughby Doell/Extender: Jolyne Loa Weeks in Treatment: 5 Vital Signs Time Taken: 14:30 Temperature (F): 98.5 Pulse (bpm): 92 Respiratory Rate (breaths/min): 20 Blood Pressure (mmHg): 118/69 Reference Range: 80 - 120 mg / dl Electronic Signature(s)  Signed: 05/18/2022 5:36:22 PM By: Deon Pilling RN, BSN Entered By: Deon Pilling on 05/18/2022 14:35:08

## 2022-05-18 NOTE — Progress Notes (Addendum)
Kristen [name], [name] ([id number]) Visit Report for 05/18/2022 Chief Complaint Document Details Patient Name: Date of Service: Kristen [name], Kristen [name] 05/18/2022 2:15 PM Medical Record Number: [id number] Patient Account Number: [id number] Date of Birth/Sex: Treating RN: 02-25-1941 (81 y.o. Kristen [name] Primary Care Provider: Cipriano Mile Other Clinician: Referring Provider: Treating Provider/Extender: Jolyne Loa Weeks in Treatment: 5 Information Obtained from: Patient Chief Complaint 04/13/2022; patient is here for review of a wound on her right lateral lower leg Electronic Signature(s) Signed: 05/18/2022 2:44:07 PM By: Kristen Keeler PA-C Entered By: Kristen [name] on 05/18/2022 14:44:06 -------------------------------------------------------------------------------- HPI Details Patient Name: Date of Service: Kristen [name] 05/18/2022 2:15 PM Medical Record Number: [id number] Patient Account Number: [id number] Date of Birth/Sex: Treating RN: [birth year] (81 y.o. Kristen [name] Primary Care Provider: Cipriano Mile Other Clinician: Referring Provider: Treating Provider/Extender: Jolyne Loa Weeks in Treatment: 5 History of Present Illness HPI Description: ADMISSION 04/13/2022 This is an 81 year old woman who lives on her own. She has a longstanding history of edema in her legs saw Dr. Doren Custard of [facility] on 01/09/2022. He diagnosed her with chronic venous insufficiency and lymphedema prescribed stockings. She did have venous studies that showed reflux in the right common femoral vein. The great saphenous vein was unable to be visualized no evidence of DVT or SVT For 1 reason or another the patient did not get the stockings. Unfortunately last week she suffered a fall and had a fairly clear deep skin tear on the right lateral lower leg. She is here for our review of this. She was put on clindamycin last week I think by her primary doctor and  referred here. Past medical history includes hyperlipidemia, lower extremity edema, hiatal hernia, atrial fib on Eliquis, ankle fracture, diastolic congestive heart failure, chronic kidney disease stage III, gout, scoliosis ABIs in our clinic were noncompressible bilaterally. Readmission: 04-20-2022 upon evaluation today patient appears to be doing well currently in regard to her wound in my opinion. She is actually showing signs of significant improvement which is great news. Fortunately I do not see any evidence of active infection locally or systemically at this time. No fevers, chills, nausea, vomiting, or diarrhea. 04-27-2022 upon evaluation today patient's wound is actually showing signs of improvement based on what I am seeing today the measurements are already smaller and very pleased in that regard. Fortunately I do not see any evidence of active infection locally or systemically which is great news and overall I think that we are on the right track here. No fevers, chills, nausea, vomiting, or diarrhea. 05-04-2022 upon evaluation today patient's wound is actually significantly smaller even compared to last week. We will making excellent progress and very pleased. I see no signs of active infection she did better with the Xeroform this week. 5-23 upon evaluation today patient's wound is actually very close to complete resolution. Fortunately I do not-see any evidence of active infection locally or systemically which is great news and overall I am extremely pleased with where we stand currently. No fevers, chills, nausea, vomiting, or diarrhea. 05-18-2022 upon evaluation patient's wound actually appears to be completely healed based on what I am seeing currently. I do not see any evidence of infection she is concerned about it toughen up due to the fact that is still bothering her as far as pain is concerned to a degree here. Electronic Signature(s) Signed: 05/18/2022 3:26:23 PM By: Kristen Keeler PA-C Entered By: Kristen [name] on  05/18/2022 15:26:23 -------------------------------------------------------------------------------- Physical Exam Details Patient Name: Date of Service: Kristen, [name] 05/18/2022 2:15 PM Medical Record Number: [id number] Patient Account Number: [id number] Date of Birth/Sex: Treating RN: [birth year] (81 y.o. Kristen [name] Primary Care Provider: Cipriano Mile Other Clinician: Referring Provider: Treating Provider/Extender: Jolyne Loa Weeks in Treatment: 5 Constitutional Well-nourished and well-hydrated in no acute distress. Respiratory normal breathing without difficulty. Psychiatric this patient is able to make decisions and demonstrates good insight into disease process. Alert and Oriented x 3. pleasant and cooperative. Notes Patient's wound again really does not show any signs of obvious opening at this time. She is having some discomfort for that reason she is a little worried about discontinuing wound care at this point which I completely understand. Nonetheless I do believe that based on what we are seeing at this time we should go ahead and Continue to monitor this for 1 more week hopefully by that time she will be healed and will be ready for discharge. Electronic Signature(s) Signed: 05/18/2022 3:27:19 PM By: Kristen Keeler PA-C Entered By: Kristen [name] on 05/18/2022 15:27:19 -------------------------------------------------------------------------------- Physician Orders Details Patient Name: Date of Service: Kristen [name] 05/18/2022 2:15 PM Medical Record Number: [id number] Patient Account Number: [id number] Date of Birth/Sex: Treating RN: 07-30-1941 (81 y.o. Kristen [name], [name] Primary Care Provider: Cipriano Mile Other Clinician: Referring Provider: Treating Provider/Extender: Jolyne Loa Weeks in Treatment: 5 Verbal / Phone Orders: No Diagnosis Coding ICD-10 Coding Code  Description S80.811D Abrasion, right lower leg, subsequent encounter L97.818 Non-pressure chronic ulcer of other part of right lower leg with other specified severity I89.0 Lymphedema, not elsewhere classified I87.323 Chronic venous hypertension (idiopathic) with inflammation of bilateral lower extremity Follow-up Appointments ppointment in 1 week. Margarita Grizzle and [place], Room 8 Wednesday 05/25/2022 1245 Return A Other: - Pick up new compression stockings and bring in next week for appt time. Bathing/ Shower/ Hygiene May shower with protection but do not get wound dressing(s) wet. Edema Control - Lymphedema / SCD / Other Elevate legs to the level of the heart or above for 30 minutes daily and/or when sitting, a frequency of: - 3-4 times a day throughout the day. Avoid standing for long periods of time. Patient to wear own compression stockings every day. - left leg apply in the morning and remove at night. Compression stocking or Garment 30-40 mm/Hg pressure to: - Pick up a new pair of [facility]ings [facility] Bring in next week. Other Edema Control Orders/Instructions: - Right leg apply xeroform to closed area with abd pad for protection and double Tubigrip size D from toes to just below knee. Wound Treatment Electronic Signature(s) Signed: 05/18/2022 4:33:23 PM By: Kristen Keeler PA-C Signed: 05/18/2022 5:36:22 PM By: Deon Pilling RN, BSN Entered By: Deon Pilling on 05/18/2022 15:08:35 -------------------------------------------------------------------------------- Problem List Details Patient Name: Date of Service: Kristen [name] 05/18/2022 2:15 PM Medical Record Number: [id number] Patient Account Number: [id number] Date of Birth/Sex: Treating RN: 1941/04/18 (81 y.o. Kristen [name] Primary Care Provider: Cipriano Mile Other Clinician: Referring Provider: Treating Provider/Extender: Jolyne Loa Weeks in Treatment: 5 Active  Problems ICD-10 Encounter Code Description Active Date MDM Diagnosis S80.811D Abrasion, right lower leg, subsequent encounter 04/13/2022 No Yes L97.818 Non-pressure chronic ulcer of other part of right lower leg with other specified 04/13/2022 No Yes severity I89.0 Lymphedema, not elsewhere classified 04/13/2022 No Yes I87.323 Chronic venous hypertension (idiopathic) with inflammation of bilateral lower 04/13/2022 No Yes  extremity Inactive Problems Resolved Problems Electronic Signature(s) Signed: 05/18/2022 2:43:57 PM By: Kristen Keeler PA-C Entered By: Kristen [name] on 05/18/2022 14:43:57 -------------------------------------------------------------------------------- Progress Note Details Patient Name: Date of Service: Kristen [name], Kristen [name] 05/18/2022 2:15 PM Medical Record Number: [id number] Patient Account Number: [id number] Date of Birth/Sex: Treating RN: 05/24/1941 (81 y.o. Kristen [name] Primary Care Provider: Cipriano Mile Other Clinician: Referring Provider: Treating Provider/Extender: Jolyne Loa Weeks in Treatment: 5 Subjective Chief Complaint Information obtained from Patient 04/13/2022; patient is here for review of a wound on her right lateral lower leg History of Present Illness (HPI) ADMISSION 04/13/2022 This is an 81 year old woman who lives on her own. She has a longstanding history of edema in her legs saw Dr. Doren Custard of [facility] on 01/09/2022. He diagnosed her with chronic venous insufficiency and lymphedema prescribed stockings. She did have venous studies that showed reflux in the right common femoral vein. The great saphenous vein was unable to be visualized no evidence of DVT or SVT For 1 reason or another the patient did not get the stockings. Unfortunately last week she suffered a fall and had a fairly clear deep skin tear on the right lateral lower leg. She is here for our review of this. She was put on clindamycin last week I think by her  primary doctor and referred here. Past medical history includes hyperlipidemia, lower extremity edema, hiatal hernia, atrial fib on Eliquis, ankle fracture, diastolic congestive heart failure, chronic kidney disease stage III, gout, scoliosis ABIs in our clinic were noncompressible bilaterally. Readmission: 04-20-2022 upon evaluation today patient appears to be doing well currently in regard to her wound in my opinion. She is actually showing signs of significant improvement which is great news. Fortunately I do not see any evidence of active infection locally or systemically at this time. No fevers, chills, nausea, vomiting, or diarrhea. 04-27-2022 upon evaluation today patient's wound is actually showing signs of improvement based on what I am seeing today the measurements are already smaller and very pleased in that regard. Fortunately I do not see any evidence of active infection locally or systemically which is great news and overall I think that we are on the right track here. No fevers, chills, nausea, vomiting, or diarrhea. 05-04-2022 upon evaluation today patient's wound is actually significantly smaller even compared to last week. We will making excellent progress and very pleased. I see no signs of active infection she did better with the Xeroform this week. 5-23 upon evaluation today patient's wound is actually very close to complete resolution. Fortunately I do not-see any evidence of active infection locally or systemically which is great news and overall I am extremely pleased with where we stand currently. No fevers, chills, nausea, vomiting, or diarrhea. 05-18-2022 upon evaluation patient's wound actually appears to be completely healed based on what I am seeing currently. I do not see any evidence of infection she is concerned about it toughen up due to the fact that is still bothering her as far as pain is concerned to a degree here. Objective Constitutional Well-nourished and  well-hydrated in no acute distress. Vitals Time Taken: 2:30 PM, Temperature: 98.5 F, Pulse: 92 bpm, Respiratory Rate: 20 breaths/min, Blood Pressure: 118/69 mmHg. Respiratory normal breathing without difficulty. Psychiatric this patient is able to make decisions and demonstrates good insight into disease process. Alert and Oriented x 3. pleasant and cooperative. General Notes: Patient's wound again really does not show any signs of obvious opening at this time.  She is having some discomfort for that reason she is a little worried about discontinuing wound care at this point which I completely understand. Nonetheless I do believe that based on what we are seeing at this time we should go ahead and Continue to monitor this for 1 more week hopefully by that time she will be healed and will be ready for discharge. Integumentary (Hair, Skin) Wound #1 status is Open. Original cause of wound was Laceration. The date acquired was: 04/06/2022. The wound has been in treatment 5 weeks. The wound is located on the Right,Lateral Lower Leg. The wound measures 0cm length x 0cm width x 0cm depth; 0cm^2 area and 0cm^3 volume. There is no tunneling or undermining noted. There is a none present amount of drainage noted. The wound margin is distinct with the outline attached to the wound base. There is no granulation within the wound bed. There is no necrotic tissue within the wound bed. Assessment Active Problems ICD-10 Abrasion, right lower leg, subsequent encounter Non-pressure chronic ulcer of other part of right lower leg with other specified severity Lymphedema, not elsewhere classified Chronic venous hypertension (idiopathic) with inflammation of bilateral lower extremity Plan Follow-up Appointments: Return Appointment in 1 week. Margarita Grizzle and [place], Room 8 Wednesday 05/25/2022 1245 Other: - Pick up new compression stockings and bring in next week for appt time. Bathing/ Shower/ Hygiene: May shower with  protection but do not get wound dressing(s) wet. Edema Control - Lymphedema / SCD / Other: Elevate legs to the level of the heart or above for 30 minutes daily and/or when sitting, a frequency of: - 3-4 times a day throughout the day. Avoid standing for long periods of time. Patient to wear own compression stockings every day. - left leg apply in the morning and remove at night. Compression stocking or Garment 30-40 mm/Hg pressure to: - Pick up a new pair of compression stockings [facility]. Bring in next week. Other Edema Control Orders/Instructions: - Right leg apply xeroform to closed area with abd pad for protection and double Tubigrip size D from toes to just below knee. 1. I would recommend currently that we go ahead and continue with the wound care measures as before and the patient is in agreement with plan. This includes the use of the Xeroform gauze which I think is doing well we will use an ABD pad. 2. I am also going to recommend that we have the patient use Tubigrip size D double layer which I think is good to do well as far as keeping the edema under good control she can also pull this back up if it starts to slide down. We will see patient back for reevaluation in 1 week here in the clinic. If anything worsens or changes patient will contact our office for additional recommendations. Electronic Signature(s) Signed: 05/18/2022 3:28:32 PM By: Kristen Keeler PA-C Entered By: Kristen [name] on 05/18/2022 15:28:32 -------------------------------------------------------------------------------- SuperBill Details Patient Name: Date of Service: Kristen [name] 05/18/2022 Medical Record Number: [id number] Patient Account Number: [id number] Date of Birth/Sex: Treating RN: 1940-12-10 (81 y.o. Kristen [name] Primary Care Provider: Cipriano Mile Other Clinician: Referring Provider: Treating Provider/Extender: Jolyne Loa Weeks in Treatment: 5 Diagnosis  Coding ICD-10 Codes Code Description [phone] Abrasion, right lower leg, subsequent encounter L97.818 Non-pressure chronic ulcer of other part of right lower leg with other specified severity I89.0 Lymphedema, not elsewhere classified I87.323 Chronic venous hypertension (idiopathic) with inflammation of bilateral lower extremity Facility  Procedures CPT4 Code: [id number] Description: 57903 - WOUND CARE VISIT-LEV 3 EST PT Modifier: Quantity: 1 Physician Procedures : CPT4 Code Description Modifier [id number] - WC PHYS LEVEL 3 - EST PT ICD-10 Diagnosis Description S80.811D Abrasion, right lower leg, subsequent encounter L97.818 Non-pressure chronic ulcer of other part of right lower leg with other specified  severity I89.0 Lymphedema, not elsewhere classified I87.323 Chronic venous hypertension (idiopathic) with inflammation of bilateral lower extremity Quantity: 1 Electronic Signature(s) Signed: 05/18/2022 4:33:23 PM By: Kristen Keeler PA-C Signed: 05/18/2022 5:36:22 PM By: Deon Pilling RN, BSN Previous Signature: 05/18/2022 3:28:45 PM Version By: Kristen Keeler PA-C Entered By: Deon Pilling on 05/18/2022 15:32:21

## 2022-05-21 ENCOUNTER — Other Ambulatory Visit: Payer: Self-pay | Admitting: Cardiology

## 2022-05-21 ENCOUNTER — Other Ambulatory Visit: Payer: Self-pay | Admitting: Family Medicine

## 2022-05-25 ENCOUNTER — Encounter (HOSPITAL_BASED_OUTPATIENT_CLINIC_OR_DEPARTMENT_OTHER): Payer: Medicare Other | Admitting: Physician Assistant

## 2022-05-25 DIAGNOSIS — I87323 Chronic venous hypertension (idiopathic) with inflammation of bilateral lower extremity: Secondary | ICD-10-CM | POA: Diagnosis not present

## 2022-05-25 DIAGNOSIS — M7989 Other specified soft tissue disorders: Secondary | ICD-10-CM

## 2022-05-25 NOTE — Progress Notes (Signed)
Kristen [name], Kristen [name] ([id number]) Visit Report for 05/25/2022 Arrival Information Details Patient Name: Date of Service: Kristen [name], Kristen [name] 05/25/2022 12:45 PM Medical Record Number: [id number] Patient Account Number: [id number] Date of Birth/Sex: Treating RN: August 21, 1941 (81 y.o. Kristen [name]: Cipriano Mile Other Clinician: Referring Janeshia Ciliberto: Treating Casin Federici/Extender: Jolyne Loa Weeks in Treatment: 6 Visit Information History Since Last Visit Added or deleted any medications: No Patient Arrived: Ambulatory Any new allergies or adverse reactions: No Arrival Time: 12:53 Had a fall or experienced change in No Accompanied By: self activities of daily living that may affect Transfer Assistance: None risk of falls: Patient Identification Verified: Yes Signs or symptoms of abuse/neglect since last visito No Secondary Verification Process Completed: Yes Hospitalized since last visit: No Patient Requires Transmission-Based Precautions: No Implantable device outside of the clinic excluding No Patient Has Alerts: Yes cellular tissue based products placed in the center Patient Alerts: [name] left ABI 04/13/2022 since last visit: Has Dressing in Place as Prescribed: Yes Has Compression in Place as Prescribed: Yes Pain Present Now: No Electronic Signature(s) Signed: 05/25/2022 4:20:13 PM By: Deon Pilling RN, BSN Entered By: Deon Pilling on 05/25/2022 12:53:22 -------------------------------------------------------------------------------- Clinic Level of Care Assessment Details Patient Name: Date of Service: Kristen [name] 05/25/2022 12:45 PM Medical Record Number: [id number] Patient Account Number: [id number] Date of Birth/Sex: Treating RN: 02-24-1941 (81 y.o. Kristen [name] Primary Care Candice [name]: Cipriano Mile Other Clinician: Referring Markus Casten: Treating Chelsye Suhre/Extender: Jolyne Loa Weeks in Treatment: 6 Clinic Level of  Care Assessment Items TOOL 4 Quantity Score X- 1 0 Use when only an EandM is performed on FOLLOW-UP visit ASSESSMENTS - Nursing Assessment / Reassessment X- 1 10 Reassessment of Co-morbidities (includes updates in patient status) X- 1 5 Reassessment of Adherence to Treatment Plan ASSESSMENTS - Wound and Skin A ssessment / Reassessment X - Simple Wound Assessment / Reassessment - one wound 1 5 '[]'$  - 0 Complex Wound Assessment / Reassessment - multiple wounds X- 1 10 Dermatologic / Skin Assessment (not related to wound area) ASSESSMENTS - Focused Assessment X- 1 5 Circumferential Edema Measurements - multi extremities '[]'$  - 0 Nutritional Assessment / Counseling / Intervention '[]'$  - 0 Lower Extremity Assessment (monofilament, tuning fork, pulses) '[]'$  - 0 Peripheral Arterial Disease Assessment (using hand held doppler) ASSESSMENTS - Ostomy and/or Continence Assessment and Care '[]'$  - 0 Incontinence Assessment and Management '[]'$  - 0 Ostomy Care Assessment and Management (repouching, etc.) PROCESS - Coordination of Care X - Simple Patient / Family Education for ongoing care 1 15 '[]'$  - 0 Complex (extensive) Patient / Family Education for ongoing care X- 1 10 Staff obtains Programmer, systems, Records, T Results / Process Orders est '[]'$  - 0 Staff telephones HHA, Nursing Homes / Clarify orders / etc '[]'$  - 0 Routine Transfer to another Facility (non-emergent condition) '[]'$  - 0 Routine Hospital Admission (non-emergent condition) '[]'$  - 0 New Admissions / Biomedical engineer / Ordering NPWT Apligraf, etc. , '[]'$  - 0 Emergency Hospital Admission (emergent condition) X- 1 10 Simple Discharge Coordination '[]'$  - 0 Complex (extensive) Discharge Coordination PROCESS - Special Needs '[]'$  - 0 Pediatric / Minor Patient Management '[]'$  - 0 Isolation Patient Management '[]'$  - 0 Hearing / Language / Visual special needs '[]'$  - 0 Assessment of Community assistance (transportation, D/C planning, etc.) '[]'$  -  0 Additional assistance / Altered mentation '[]'$  - 0 Support Surface(s) Assessment (bed, cushion, seat, etc.) INTERVENTIONS - Wound Cleansing / Measurement X - Simple Wound Cleansing -  one wound 1 5 '[]'$  - 0 Complex Wound Cleansing - multiple wounds X- 1 5 Wound Imaging (photographs - any number of wounds) '[]'$  - 0 Wound Tracing (instead of photographs) X- 1 5 Simple Wound Measurement - one wound '[]'$  - 0 Complex Wound Measurement - multiple wounds INTERVENTIONS - Wound Dressings '[]'$  - 0 Small Wound Dressing one or multiple wounds '[]'$  - 0 Medium Wound Dressing one or multiple wounds '[]'$  - 0 Large Wound Dressing one or multiple wounds '[]'$  - 0 Application of Medications - topical '[]'$  - 0 Application of Medications - injection INTERVENTIONS - Miscellaneous '[]'$  - 0 External ear exam '[]'$  - 0 Specimen Collection (cultures, biopsies, blood, body fluids, etc.) '[]'$  - 0 Specimen(s) / Culture(s) sent or taken to Lab for analysis '[]'$  - 0 Patient Transfer (multiple staff / Civil Service fast streamer / Similar devices) '[]'$  - 0 Simple Staple / Suture removal (25 or less) '[]'$  - 0 Complex Staple / Suture removal (26 or more) '[]'$  - 0 Hypo / Hyperglycemic Management (close monitor of Blood Glucose) '[]'$  - 0 Ankle / Brachial Index (ABI) - do not check if billed separately X- 1 5 Vital Signs Has the patient been seen at the hospital within the last three years: Yes Total Score: 90 Level Of Care: New/Established - Level 3 Electronic Signature(s) Signed: 05/25/2022 4:20:13 PM By: Deon Pilling RN, BSN Entered By: Deon Pilling on 05/25/2022 12:59:45 -------------------------------------------------------------------------------- Encounter Discharge Information Details Patient Name: Date of Service: Kristen [name] 05/25/2022 12:45 PM Medical Record Number: [id number] Patient Account Number: [id number] Date of Birth/Sex: Treating RN: 08-25-1941 (81 y.o. Kristen [name] Primary Care Amiri Riechers: Cipriano Mile Other  Clinician: Referring Leotis Isham: Treating Tonantzin Mimnaugh/Extender: Jolyne Loa Weeks in Treatment: 6 Encounter Discharge Information Items Discharge Condition: Stable Ambulatory Status: Ambulatory Discharge Destination: Home Transportation: Private Auto Accompanied By: self Schedule Follow-up Appointment: No Clinical Summary of Care: Notes Applied compression stockings to BLE. Electronic Signature(s) Signed: 05/25/2022 4:20:13 PM By: Deon Pilling RN, BSN Entered By: Deon Pilling on 05/25/2022 13:00:12 -------------------------------------------------------------------------------- Lower Extremity Assessment Details Patient Name: Date of Service: Kristen [name], Kristen [name] 05/25/2022 12:45 PM Medical Record Number: [id number] Patient Account Number: [id number] Date of Birth/Sex: Treating RN: 04-08-1941 (81 y.o. Kristen [name] Primary Care Sakira Dahmer: Cipriano Mile Other Clinician: Referring Kinzleigh Kandler: Treating Demaria Deeney/Extender: Jolyne Loa Weeks in Treatment: 6 Edema Assessment Assessed: [Left: No] [Right: Yes] Edema: [Left: Ye] [Right: s] Calf Left: Right: Point of Measurement: 28 cm From Medial Instep 37 cm Ankle Left: Right: Point of Measurement: 10 cm From Medial Instep 22 cm Vascular Assessment Pulses: Dorsalis Pedis Palpable: [Right:Yes] Electronic Signature(s) Signed: 05/25/2022 4:20:13 PM By: Deon Pilling RN, BSN Entered By: Deon Pilling on 05/25/2022 12:57:57 -------------------------------------------------------------------------------- [place] Details Patient Name: Date of Service: Kristen [name] 05/25/2022 12:45 PM Medical Record Number: [id number] Patient Account Number: [id number] Date of Birth/Sex: Treating RN: [birth year] (81 y.o. Kristen [name] Primary Care Lyndia Bury: Cipriano Mile Other Clinician: Referring Sherine Cortese: Treating Adaliah Hiegel/Extender: Jolyne Loa Weeks in Treatment:  6 Active Inactive Electronic Signature(s) Signed: 05/25/2022 4:20:13 PM By: Deon Pilling RN, BSN Entered By: Deon Pilling on 05/25/2022 12:58:07 -------------------------------------------------------------------------------- Pain Assessment Details Patient Name: Date of Service: Kristen [name] 05/25/2022 12:45 PM Medical Record Number: [id number] Patient Account Number: [id number] Date of Birth/Sex: Treating RN: [birth year] (81 y.o. Kristen [name] Primary Care Burnett Spray: Cipriano Mile Other Clinician: Referring Dericka Ostenson: Treating Temprance Wyre/Extender: Jolyne Loa Weeks in Treatment: 6 Active Problems Location  of Pain Severity and Description of Pain Patient Has Paino No Site Locations Rate the pain. Rate the pain. Current Pain Level: 0 Pain Management and Medication Current Pain Management: Medication: No Cold Application: No Rest: No Massage: No Activity: No T.E.N.S.: No Heat Application: No Leg drop or elevation: No Is the Current Pain Management Adequate: Adequate How does your wound impact your activities of daily livingo Sleep: No Bathing: No Appetite: No Relationship With Others: No Bladder Continence: No Emotions: No Bowel Continence: No Work: No Toileting: No Drive: No Dressing: No Hobbies: No Engineer, maintenance) Signed: 05/25/2022 4:20:13 PM By: Deon Pilling RN, BSN Entered By: Deon Pilling on 05/25/2022 12:53:36 -------------------------------------------------------------------------------- Patient/Caregiver Education Details Patient Name: Date of Service: Kristen [name] 7/19/2023andnbsp12:45 PM Medical Record Number: [id number] Patient Account Number: [id number] Date of Birth/Gender: Treating RN: [birth year] (81 y.o. Kristen [name] Primary Care Physician: Cipriano Mile Other Clinician: Referring Physician: Treating Physician/Extender: Jolyne Loa Weeks in Treatment: 6 Education  Assessment Education Provided To: Patient Education Topics Provided Venous: Handouts: Controlling Swelling with Compression Stockings Methods: Explain/Verbal Responses: Reinforcements needed Electronic Signature(s) Signed: 05/25/2022 4:20:13 PM By: Deon Pilling RN, BSN Entered By: Deon Pilling on 05/25/2022 12:58:18 -------------------------------------------------------------------------------- Vitals Details Patient Name: Date of Service: Kristen [name] 05/25/2022 12:45 PM Medical Record Number: [id number] Patient Account Number: [id number] Date of Birth/Sex: Treating RN: 1941-04-08 (81 y.o. Kristen [name] Primary Care Raphaela Cannaday: Cipriano Mile Other Clinician: Referring Savaya Hakes: Treating Naveen Clardy/Extender: Jolyne Loa Weeks in Treatment: 6 Vital Signs Time Taken: 12:53 Temperature (F): 98.3 Pulse (bpm): 80 Respiratory Rate (breaths/min): 20 Blood Pressure (mmHg): 105/72 Reference Range: 80 - 120 mg / dl Electronic Signature(s) Signed: 05/25/2022 4:20:13 PM By: Deon Pilling RN, BSN Entered By: Deon Pilling on 05/25/2022 12:56:58

## 2022-05-25 NOTE — Progress Notes (Signed)
Kristen [name], Kristen [name] ([id number]) Visit Report for 05/25/2022 Chief Complaint Document Details Patient Name: Date of Service: Kristen [name], Kristen [name] 05/25/2022 12:45 PM Medical Record Number: [id number] Patient Account Number: [id number] Date of Birth/Sex: Treating RN: 10/31/1941 (81 y.o. Kristen [name] Primary Care Provider: Cipriano Mile Other Clinician: Referring Provider: Treating Provider/Extender: Jolyne Loa Weeks in Treatment: 6 Information Obtained from: Patient Chief Complaint 04/13/2022; patient is here for review of a wound on her right lateral lower leg Electronic Signature(s) Signed: 05/25/2022 1:07:17 PM By: Worthy Keeler PA-C Entered By: Worthy Keeler on 05/25/2022 13:07:17 -------------------------------------------------------------------------------- HPI Details Patient Name: Date of Service: Kristen [name] 05/25/2022 12:45 PM Medical Record Number: [id number] Patient Account Number: [id number] Date of Birth/Sex: Treating RN: April 10, 1941 (81 y.o. Kristen [name] Primary Care Provider: Cipriano Mile Other Clinician: Referring Provider: Treating Provider/Extender: Jolyne Loa Weeks in Treatment: 6 History of Present Illness HPI Description: ADMISSION 04/13/2022 This is an 81 year old woman who lives on her own. She has a longstanding history of edema in her legs saw Dr. Doren Custard of vein and vascular on 01/09/2022. He diagnosed her with chronic venous insufficiency and lymphedema prescribed stockings. She did have venous studies that showed reflux in the right common femoral vein. The great saphenous vein was unable to be visualized no evidence of DVT or SVT For 1 reason or another the patient did not get the stockings. Unfortunately last week she suffered a fall and had a fairly clear deep skin tear on the right lateral lower leg. She is here for our review of this. She was put on clindamycin last week I think by her primary doctor and  referred here. Past medical history includes hyperlipidemia, lower extremity edema, hiatal hernia, atrial fib on Eliquis, ankle fracture, diastolic congestive heart failure, chronic kidney disease stage III, gout, scoliosis ABIs in our clinic were noncompressible bilaterally. Readmission: 04-20-2022 upon evaluation today patient appears to be doing well currently in regard to her wound in my opinion. She is actually showing signs of significant improvement which is great news. Fortunately I do not see any evidence of active infection locally or systemically at this time. No fevers, chills, nausea, vomiting, or diarrhea. 04-27-2022 upon evaluation today patient's wound is actually showing signs of improvement based on what I am seeing today the measurements are already smaller and very pleased in that regard. Fortunately I do not see any evidence of active infection locally or systemically which is great news and overall I think that we are on the right track here. No fevers, chills, nausea, vomiting, or diarrhea. 05-04-2022 upon evaluation today patient's wound is actually significantly smaller even compared to last week. We will making excellent progress and very pleased. I see no signs of active infection she did better with the Xeroform this week. 5-23 upon evaluation today patient's wound is actually very close to complete resolution. Fortunately I do not-see any evidence of active infection locally or systemically which is great news and overall I am extremely pleased with where we stand currently. No fevers, chills, nausea, vomiting, or diarrhea. 05-18-2022 upon evaluation patient's wound actually appears to be completely healed based on what I am seeing currently. I do not see any evidence of infection she is concerned about it toughen up due to the fact that is still bothering her as far as pain is concerned to a degree here. 05-25-2022 upon evaluation today patient appears to be doing well with  regard to her wound in fact  this is still completely closed. Fortunately I do not see any evidence of infection locally or systemically at this time. No fevers, chills, nausea, vomiting, or diarrhea. Electronic Signature(s) Signed: 05/25/2022 1:20:10 PM By: Worthy Keeler PA-C Entered By: Worthy Keeler on 05/25/2022 13:20:10 -------------------------------------------------------------------------------- Physical Exam Details Patient Name: Date of Service: Kristen [name] 05/25/2022 12:45 PM Medical Record Number: [id number] Patient Account Number: [id number] Date of Birth/Sex: Treating RN: Mar 17, 1941 (81 y.o. Kristen [name] Primary Care Provider: Cipriano Mile Other Clinician: Referring Provider: Treating Provider/Extender: Jolyne Loa Weeks in Treatment: 6 Constitutional Well-nourished and well-hydrated in no acute distress. Respiratory normal breathing without difficulty. Psychiatric this patient is able to make decisions and demonstrates good insight into disease process. Alert and Oriented x 3. pleasant and cooperative. Notes Upon evaluation patient appears to be doing excellent in regard to her wound in fact this is completely closed and I am extremely pleased in that regard. She does have her compression socks as well. Electronic Signature(s) Signed: 05/25/2022 1:20:38 PM By: Worthy Keeler PA-C Entered By: Worthy Keeler on 05/25/2022 13:20:38 -------------------------------------------------------------------------------- Physician Orders Details Patient Name: Date of Service: Kristen [name], Kristen [name] 05/25/2022 12:45 PM Medical Record Number: [id number] Patient Account Number: [id number] Date of Birth/Sex: Treating RN: [birth year] (81 y.o. Kristen [name] Primary Care Provider: Cipriano Mile Other Clinician: Referring Provider: Treating Provider/Extender: Jolyne Loa Weeks in Treatment: 6 Verbal / Phone Orders: No Diagnosis  Coding Discharge From [facility] Services Discharge from [place] - Call if any future wound care needs. Edema Control - Lymphedema / SCD / Other Elevate legs to the level of the heart or above for 30 minutes daily and/or when sitting, a frequency of: - 3-4 times a day throughout the day. Avoid standing for long periods of time. Patient to wear own compression stockings every day. Exercise regularly Moisturize legs daily. - both legs every night before bed. Compression stocking or Garment 30-40 mm/Hg pressure to: - apply in the morning and remove at night. Electronic Signature(s) Signed: 05/25/2022 3:46:42 PM By: Worthy Keeler PA-C Signed: 05/25/2022 4:20:13 PM By: Deon Pilling RN, BSN Entered By: Deon Pilling on 05/25/2022 12:59:17 -------------------------------------------------------------------------------- Problem List Details Patient Name: Date of Service: Kristen [name] 05/25/2022 12:45 PM Medical Record Number: [id number] Patient Account Number: [id number] Date of Birth/Sex: Treating RN: 01/09/1941 (81 y.o. Kristen [name] Primary Care Provider: Cipriano Mile Other Clinician: Referring Provider: Treating Provider/Extender: Jolyne Loa Weeks in Treatment: 6 Active Problems ICD-10 Encounter Code Description Active Date MDM Diagnosis S80.811D Abrasion, right lower leg, subsequent encounter 04/13/2022 No Yes L97.818 Non-pressure chronic ulcer of other part of right lower leg with other specified 04/13/2022 No Yes severity I89.0 Lymphedema, not elsewhere classified 04/13/2022 No Yes I87.323 Chronic venous hypertension (idiopathic) with inflammation of bilateral lower 04/13/2022 No Yes extremity Inactive Problems Resolved Problems Electronic Signature(s) Signed: 05/25/2022 1:07:06 PM By: Worthy Keeler PA-C Entered By: Worthy Keeler on 05/25/2022 13:07:05 -------------------------------------------------------------------------------- Progress Note  Details Patient Name: Date of Service: Kristen [name] 05/25/2022 12:45 PM Medical Record Number: [id number] Patient Account Number: [id number] Date of Birth/Sex: Treating RN: 07-Jun-1941 (81 y.o. Kristen [name] Primary Care Provider: Cipriano Mile Other Clinician: Referring Provider: Treating Provider/Extender: Jolyne Loa Weeks in Treatment: 6 Subjective Chief Complaint Information obtained from Patient 04/13/2022; patient is here for review of a wound on her right lateral lower leg History of Present Illness (HPI) ADMISSION 04/13/2022 This is  an 81 year old woman who lives on her own. She has a longstanding history of edema in her legs saw Dr. Doren Custard of vein and vascular on 01/09/2022. He diagnosed her with chronic venous insufficiency and lymphedema prescribed stockings. She did have venous studies that showed reflux in the right common femoral vein. The great saphenous vein was unable to be visualized no evidence of DVT or SVT For 1 reason or another the patient did not get the stockings. Unfortunately last week she suffered a fall and had a fairly clear deep skin tear on the right lateral lower leg. She is here for our review of this. She was put on clindamycin last week I think by her primary doctor and referred here. Past medical history includes hyperlipidemia, lower extremity edema, hiatal hernia, atrial fib on Eliquis, ankle fracture, diastolic congestive heart failure, chronic kidney disease stage III, gout, scoliosis ABIs in our clinic were noncompressible bilaterally. Readmission: 04-20-2022 upon evaluation today patient appears to be doing well currently in regard to her wound in my opinion. She is actually showing signs of significant improvement which is great news. Fortunately I do not see any evidence of active infection locally or systemically at this time. No fevers, chills, nausea, vomiting, or diarrhea. 04-27-2022 upon evaluation today patient's wound  is actually showing signs of improvement based on what I am seeing today the measurements are already smaller and very pleased in that regard. Fortunately I do not see any evidence of active infection locally or systemically which is great news and overall I think that we are on the right track here. No fevers, chills, nausea, vomiting, or diarrhea. 05-04-2022 upon evaluation today patient's wound is actually significantly smaller even compared to last week. We will making excellent progress and very pleased. I see no signs of active infection she did better with the Xeroform this week. 5-23 upon evaluation today patient's wound is actually very close to complete resolution. Fortunately I do not-see any evidence of active infection locally or systemically which is great news and overall I am extremely pleased with where we stand currently. No fevers, chills, nausea, vomiting, or diarrhea. 05-18-2022 upon evaluation patient's wound actually appears to be completely healed based on what I am seeing currently. I do not see any evidence of infection she is concerned about it toughen up due to the fact that is still bothering her as far as pain is concerned to a degree here. 05-25-2022 upon evaluation today patient appears to be doing well with regard to her wound in fact this is still completely closed. Fortunately I do not see any evidence of infection locally or systemically at this time. No fevers, chills, nausea, vomiting, or diarrhea. Objective Constitutional Well-nourished and well-hydrated in no acute distress. Vitals Time Taken: 12:53 PM, Temperature: 98.3 F, Pulse: 80 bpm, Respiratory Rate: 20 breaths/min, Blood Pressure: 105/72 mmHg. Respiratory normal breathing without difficulty. Psychiatric this patient is able to make decisions and demonstrates good insight into disease process. Alert and Oriented x 3. pleasant and cooperative. General Notes: Upon evaluation patient appears to be doing  excellent in regard to her wound in fact this is completely closed and I am extremely pleased in that regard. She does have her compression socks as well. Assessment Active Problems ICD-10 Abrasion, right lower leg, subsequent encounter Non-pressure chronic ulcer of other part of right lower leg with other specified severity Lymphedema, not elsewhere classified Chronic venous hypertension (idiopathic) with inflammation of bilateral lower extremity Plan Discharge From [facility] Services: Discharge from  [place] - Call if any future wound care needs. Edema Control - Lymphedema / SCD / Other: Elevate legs to the level of the heart or above for 30 minutes daily and/or when sitting, a frequency of: - 3-4 times a day throughout the day. Avoid standing for long periods of time. Patient to wear own compression stockings every day. Exercise regularly Moisturize legs daily. - both legs every night before bed. Compression stocking or Garment 30-40 mm/Hg pressure to: - apply in the morning and remove at night. 1. I would recommend that we going continue with wound care measures as before and the patient is in agreement with plan. This includes the use of the compression stockings which I think is still good to be the best thing for her right now I do not see any need for adjustments as far as treatments are concerned in general I think otherwise she is doing quite well. 2. Also did recommend that we have the patient continue to monitor for any signs of worsening or infection. Obviously if anything changes she should let me know but right now I think as long as she wears the compression she is good to be doing quite well. We will see her back for follow-up visit as needed. Electronic Signature(s) Signed: 05/25/2022 1:21:34 PM By: Worthy Keeler PA-C Entered By: Worthy Keeler on 05/25/2022 13:21:33 -------------------------------------------------------------------------------- SuperBill  Details Patient Name: Date of Service: Kristen [name] 05/25/2022 Medical Record Number: [id number] Patient Account Number: [id number] Date of Birth/Sex: Treating RN: 07/14/1941 (81 y.o. [name], [name] Primary Care Provider: Cipriano Mile Other Clinician: Referring Provider: Treating Provider/Extender: Jolyne Loa Weeks in Treatment: 6 Diagnosis Coding ICD-10 Codes Code Description [phone] Abrasion, right lower leg, subsequent encounter L97.818 Non-pressure chronic ulcer of other part of right lower leg with other specified severity I89.0 Lymphedema, not elsewhere classified I87.323 Chronic venous hypertension (idiopathic) with inflammation of bilateral lower extremity Facility Procedures CPT4 Code: [id number] Description: 99213 - WOUND CARE VISIT-LEV 3 EST PT Modifier: Quantity: 1 Physician Procedures : CPT4 Code Description Modifier [id number] - WC PHYS LEVEL 3 - EST PT ICD-10 Diagnosis Description S80.811D Abrasion, right lower leg, subsequent encounter L97.818 Non-pressure chronic ulcer of other part of right lower leg with other specified  severity I89.0 Lymphedema, not elsewhere classified I87.323 Chronic venous hypertension (idiopathic) with inflammation of bilateral lower extremity Quantity: 1 Electronic Signature(s) Signed: 05/25/2022 1:22:42 PM By: Worthy Keeler PA-C Entered By: Worthy Keeler on 05/25/2022 13:22:42

## 2022-05-28 ENCOUNTER — Other Ambulatory Visit: Payer: Self-pay | Admitting: Cardiology

## 2022-06-09 ENCOUNTER — Other Ambulatory Visit: Payer: Self-pay | Admitting: Obstetrics and Gynecology

## 2022-06-09 ENCOUNTER — Other Ambulatory Visit: Payer: Self-pay | Admitting: Student

## 2022-06-09 ENCOUNTER — Ambulatory Visit
Admission: RE | Admit: 2022-06-09 | Discharge: 2022-06-09 | Disposition: A | Discharge status: Home / Self Care | Payer: Medicare Other | Source: Ambulatory Visit | Attending: Student | Admitting: Student

## 2022-06-09 DIAGNOSIS — Z1231 Encounter for screening mammogram for malignant neoplasm of breast: Secondary | ICD-10-CM

## 2022-06-09 DIAGNOSIS — Z780 Asymptomatic menopausal state: Secondary | ICD-10-CM

## 2022-06-23 ENCOUNTER — Other Ambulatory Visit: Payer: Self-pay | Admitting: Family Medicine

## 2022-06-23 ENCOUNTER — Other Ambulatory Visit: Payer: Self-pay | Admitting: Physician Assistant

## 2022-07-14 ENCOUNTER — Other Ambulatory Visit: Payer: Self-pay | Admitting: Family Medicine

## 2022-07-22 ENCOUNTER — Other Ambulatory Visit: Payer: Self-pay | Admitting: Cardiology

## 2022-08-04 ENCOUNTER — Other Ambulatory Visit: Payer: Self-pay | Admitting: Cardiology

## 2022-08-11 ENCOUNTER — Other Ambulatory Visit: Payer: Self-pay | Admitting: Student

## 2022-08-11 DIAGNOSIS — E2839 Other primary ovarian failure: Secondary | ICD-10-CM

## 2022-09-19 ENCOUNTER — Other Ambulatory Visit: Payer: Self-pay | Admitting: Family Medicine

## 2022-09-20 ENCOUNTER — Other Ambulatory Visit: Payer: Self-pay | Admitting: Orthopaedic Surgery

## 2022-09-20 DIAGNOSIS — M25511 Pain in right shoulder: Secondary | ICD-10-CM

## 2022-09-27 ENCOUNTER — Other Ambulatory Visit: Payer: Medicare Other

## 2022-10-06 ENCOUNTER — Other Ambulatory Visit: Payer: Self-pay | Admitting: Family Medicine

## 2022-10-10 ENCOUNTER — Telehealth: Payer: Self-pay | Admitting: Cardiology

## 2022-10-10 NOTE — Telephone Encounter (Signed)
Patient calling the office for samples of medication:   1.  What medication and dosage are you requesting samples for? Eliquis 5 mg  2.  Are you currently out of this medication? Yes, patient is in the donut hole

## 2022-10-11 ENCOUNTER — Telehealth: Payer: Self-pay

## 2022-10-11 ENCOUNTER — Telehealth: Payer: Self-pay | Admitting: Cardiology

## 2022-10-11 MED ORDER — FUROSEMIDE 20 MG PO TABS: 20.0000 mg | ORAL_TABLET | Freq: Every day | ORAL | 0 refills | Status: DC

## 2022-10-11 NOTE — Telephone Encounter (Addendum)
    Patient Name: Kristen [name]  DOB: 06-Jan-1941 MRN: [id number]  Primary Cardiologist: [name], MD  Chart reviewed as part of pre-operative protocol coverage.   The patient already has an upcoming appointment scheduled 10/17/22 with Dr. Marlou Porch at which time this clearance should be addressed. I added "preop" comment to appointment notes so that provider is aware to address at time of OV. Per office protocol, the provider seeing this patient should forward their finalized clearance decision to requesting party below. (Note the appointment notes also already say post-hospital f/u and discuss Watchman).  - Will route to pharm so that this is available to Dr. Marlou Porch to review at [place].  - Will fax update to requesting surgeon so they are aware. Will remove from preop box as separate preop APP input not necessary at this time.  Charlie Pitter, PA-C 10/11/2022, 2:43 PM

## 2022-10-11 NOTE — Telephone Encounter (Signed)
*  STAT* If patient is at the pharmacy, call can be transferred to refill team.   1. Which medications need to be refilled? (please list name of each medication and dose if known)   furosemide (LASIX) 20 MG tablet    2. Which pharmacy/location (including street and city if local pharmacy) is medication to be sent to?  [id number]- [place], [place] - [id number]    3. Do they need a 30 day or 90 day supply?  30 day

## 2022-10-11 NOTE — Telephone Encounter (Addendum)
Pharm question noted - they are awaiting Dr. Marlou Porch input, can be addressed at his upcoming ov with pt as routed below. No need to route to preop APP pool given upcoming OV planned.

## 2022-10-11 NOTE — Telephone Encounter (Signed)
Patient with diagnosis of A Fib on Eliquis for anticoagulation.    Procedure:  RIGHT REVERSE TOTAL SHOULDER REPLACEMENT  Date of procedure: TBD   CHA2DS2-VASc Score = 7  This indicates a 11.2% annual risk of stroke. The patient's score is based upon: CHF History: 1 HTN History: 1 Diabetes History: 0 Stroke History: 2 ("she does have a couple very small strokes (lacunar) that are small enough that they may not cause any symptoms.") Vascular Disease History: 0 Age Score: 2 Gender Score: 1   CrCl 45 mL/min Platelet count 291K   **This guidance is not considered finalized until pre-operative APP has relayed final recommendations.**

## 2022-10-11 NOTE — Telephone Encounter (Signed)
...     Pre-operative Risk Assessment    Patient Name: Kristen [name]  DOB: [birth year] MRN: [id number]      Request for Surgical Clearance    Procedure:   RIGHT REVERSE TOTAL SHOULDER REPLACEMENT  Date of Surgery:  Clearance TBD                                 Surgeon:  DR Ophelia Charter Surgeon's Group or Practice Name:  [facility] Phone number:  [phone] Fax number:  [phone]   Type of Clearance Requested:   - Medical  - Pharmacy:  Hold Apixaban (Eliquis)     Type of Anesthesia:  General    Additional requests/questions:    Gwenlyn Found   10/11/2022, 2:01 PM

## 2022-10-11 NOTE — Telephone Encounter (Signed)
Pt calling to f/u on Samples. Pt states that she has missed 3 doses already. Please advise

## 2022-10-11 NOTE — Telephone Encounter (Signed)
Left the patient a message that her Eliquis samples are ready for pickup.

## 2022-10-17 ENCOUNTER — Encounter: Payer: Self-pay | Admitting: Cardiology

## 2022-10-17 ENCOUNTER — Ambulatory Visit: Payer: Medicare Other | Attending: Cardiology | Admitting: Cardiology

## 2022-10-17 VITALS — BP 100/64 | HR 90 | Ht 60.0 in | Wt 205.8 lb

## 2022-10-17 DIAGNOSIS — I872 Venous insufficiency (chronic) (peripheral): Secondary | ICD-10-CM | POA: Diagnosis not present

## 2022-10-17 DIAGNOSIS — I4821 Permanent atrial fibrillation: Secondary | ICD-10-CM

## 2022-10-17 DIAGNOSIS — N1831 Chronic kidney disease, stage 3a: Secondary | ICD-10-CM

## 2022-10-17 MED ORDER — METOPROLOL SUCCINATE ER 100 MG PO TB24: 150.0000 mg | ORAL_TABLET | Freq: Every day | ORAL | 3 refills | Status: AC

## 2022-10-17 NOTE — Patient Instructions (Signed)
Medication Instructions:  Please increase your Metoprolol to 150 mg a day. Continue all other medications as listed.  *If you need a refill on your cardiac medications before your next appointment, please call your pharmacy*  Follow-Up: At [facility], you and your health needs are our priority.  As part of our continuing mission to provide you with exceptional heart care, we have created designated Provider Care Teams.  These Care Teams include your primary Cardiologist (physician) and Advanced Practice Providers (APPs -  Physician Assistants and Nurse Practitioners) who all work together to provide you with the care you need, when you need it.  We recommend signing up for the patient portal called "MyChart".  Sign up information is provided on this After Visit Summary.  MyChart is used to connect with patients for Virtual Visits (Telemedicine).  Patients are able to view lab/test results, encounter notes, upcoming appointments, etc.  Non-urgent messages can be sent to your provider as well.   To learn more about what you can do with MyChart, go to [email or web address].    Your next appointment:   2 month(s)  The format for your next appointment:   In Person  Provider:   Nicholes Rough, PA-C, Melina Copa, PA-C, Ambrose Pancoast, NP, Ermalinda Barrios, PA-C, Christen Bame, NP, or Richardson Dopp, PA-C          Important Information About Sugar

## 2022-10-17 NOTE — Progress Notes (Signed)
Cardiology Office Note:    Date:  10/17/2022   ID:  JAVANNA PATIN, DOB 06/29/1941, MRN [id number]  PCP:  Cipriano Mile, NP   [place]  Cardiologist:  [name], MD  Advanced Practice Provider:  No care team member to display Electrophysiologist:  None       Referring MD: Cipriano Mile, NP     History of Present Illness:    Kristen [name] is a 81 y.o. female here for the follow-up of atrial fibrillation, chronic anticoagulation with Eliquis.  Remember she does have an allergy to flecainide which was severe.  She teaches dancing in the past.  Problems following gallbladder surgery.  Was in the hospital about a month.  Deals with chronic venous insufficiency, [place] vein specialist.  Also sees nephrology for her chronic kidney disease stage III.  She has stopped fish oil.  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  She is going to undergo a right reverse total shoulder replacement.  Her CHA2DS2-VASc score is 7.  [facility] procedure by OB/GYN.  10/02/22 -was excited about going to visit her son in [place].  He is friends with professors at [employer] was excited about going to visit her son in [place].  He is friends with professors at [employer] of [place].  She ended up developing rapid atrial fibrillation.  Went to the emergency department at [facility].  They suggested increasing her metoprolol.  She would ask me.  She is also seeing the wound clinic and has had debridement.  Right lower extremity.  Weeping.  Lymphedema  Received IV abx for right leg given. Got Eliquis samples from our clinic.  Her leg wound arrived after she dropped a box on her lower extremity.  She is trying to downsize.  She states that she will not likely go forward with her shoulder surgery since she cannot be in a sling for a prolonged period of time at this time.  Past Medical History:  Diagnosis Date    Abscess of abdominal cavity ([place]) 10/02/2019   Anticoagulant long-term use    elquis -- managed by cardiology   Anxiety    SITUATIONAL IN PAST   Atrial fibrillation ([place])    Chronic calculous cholecystitis    Chronic kidney disease    ckd stage 4 per lov dr Corliss Parish 09-28-2020 on chart   Chronic venous insufficiency    w/  varicose veins right worse than left waers compresiion stocking prn   Depression    SITUATIONAL IN PAST   Diastolic CHF, chronic ([place]) 2010   followed by cardiology HX OF 2010 DUE TO FLECANIDE   Diverticulosis of colon    Dyspnea    occ with heavt activity and at bedtime when tired for last few months per pt   Dysrhythmia    a-fib   Edema of both lower extremities    per pt mostly in summer time   Essential hypertension, benign    Fibrocystic breast disease    Full dentures    GERD (gastroesophageal reflux disease)    occasional tums and does not eat prior to bedtime   Gout    08-19-2019  per pt last episode DEC 2021 LEFT HAND   Hiatal hernia    History of diverticulitis 01/22/2016   History of recurrent UTIs    NONE RECNET SEES DR WRENN   Hx of cholecystectomy 10/02/2019   Hyperlipidemia    Insomnia  Migraines    OA (osteoarthritis)    knees, lower back   Permanent atrial fibrillation [facility]) cardiologist--- dr Agustin Cree   first dx 10/ 2011--- histroy DCCV 11-25-2010 by dr Wynonia Lawman (pt's previous cardiologist) and Cardiac cath 12-17-2010 no significant disease   Pneumonia 08/2019   COLLAPSED LEFT LUNG AND PNEUMONIA   Scoliosis    Stress incontinence in female     Past Surgical History:  Procedure Laterality Date   BILIARY STENT PLACEMENT N/A 09/03/2019   Procedure: BILIARY STENT PLACEMENT;  Surgeon: Clarene Essex, MD;  Location: [facility];  Service: Endoscopy;  Laterality: N/A;   BLEPHAROPLASTY Bilateral 02-22-2010  dr Georgia Lopes   upper eyelid's   both knees cortisone injection  08/18/2020   dr Noemi Chapel   BUNIONECTOMY Right 2003    CATARACT EXTRACTION W/ INTRAOCULAR LENS  IMPLANT, BILATERAL  2018   CERVICAL CONIZATION W/BX N/A 11/05/2020   Procedure: CONIZATION CERVIX WITH BIOPSY;  Surgeon: Newton Pigg, MD;  Location: [facility];  Service: Gynecology;  Laterality: N/A;   CHOLECYSTECTOMY N/A 08/22/2019   Procedure: LAPAROSCOPIC CHOLECYSTECTOMY;  Surgeon: [name], [name], MD;  Location: [facility];  Service: General;  Laterality: N/A;   ERCP N/A 09/03/2019   Procedure: ENDOSCOPIC RETROGRADE CHOLANGIOPANCREATOGRAPHY (ERCP);  Surgeon: Clarene Essex, MD;  Location: [facility];  Service: Endoscopy;  Laterality: N/A;   ESOPHAGOGASTRODUODENOSCOPY (EGD) WITH PROPOFOL N/A 12/06/2019   Procedure: ESOPHAGOGASTRODUODENOSCOPY (EGD) WITH PROPOFOL;  Surgeon: Clarene Essex, MD;  [facility];  Service: Endoscopy;  Laterality: N/A;  stent removal, ERCP Scope, needs Flouro   HYSTEROSCOPY WITH D & C N/A 11/05/2020   Procedure: DILATATION AND CURETTAGE /HYSTEROSCOPY WITH MYOSURE;  Surgeon: Newton Pigg, MD;  Location: [place] [facility];  Service: Gynecology;  Laterality: N/A;   IR SINUS/FIST TUBE CHK-NON GI  09/10/2019   IR SINUS/FIST TUBE CHK-NON GI  09/24/2019   KNEE ARTHROSCOPY Left 2002   meniscus repair   REMOVAL OF STONES  09/03/2019   Procedure: REMOVAL OF STONES;  Surgeon: Clarene Essex, MD;  [facility];  Service: Endoscopy;;   SPHINCTEROTOMY  09/03/2019   Procedure: Joan Mayans;  Surgeon: Clarene Essex, MD;  [facility];  Service: Endoscopy;;   STENT REMOVAL  12/06/2019   Procedure: STENT REMOVAL;  Surgeon: Clarene Essex, MD;  [facility];  Service: Endoscopy;;   TUBAL LIGATION Bilateral 1980   AND RIGHT BREAST EXCISION CYST (BENIGN)    Current Medications: Current Meds  Medication Sig   acetaminophen (TYLENOL) 650 MG CR tablet Take 650-1,300 mg by mouth every 8 (eight) hours as needed for pain.   alendronate (FOSAMAX) 10 MG tablet TAKE (1)  TABLET BY MOUTH PER WEEK TAKE ON EMPTY STOMACH WITH FULL GLASS OF WATER   allopurinol (ZYLOPRIM) 100 MG tablet TAKE 1 TABLET BY MOUTH EVERY DAY   calcium carbonate (TUMS - DOSED IN MG ELEMENTAL CALCIUM) 500 MG chewable tablet Chew 1-2 tablets by mouth at bedtime.   CALCIUM PO Take 1 tablet by mouth daily.   Carboxymethylcellul-Glycerin (LUBRICATING EYE DROPS OP) Place 1 drop into both eyes daily as needed (dry eyes).   cholecalciferol (VITAMIN D3) 25 MCG (1000 UT) tablet Take 1,000 Units by mouth daily.   clindamycin (CLINDAGEL) 1 % gel APPLY TO AFFECTED AREA TWICE A DAY   colchicine 0.6 MG tablet Take 1 tablet (0.6 mg total) by mouth 2 (two) times daily. Take twice daily until pain resolves and then take as needed for gout flare.   [employer] ANTI-FUNGAL 2 %  powder APPLY TO AFFECTED AREA TWICE A DAY AFTER SKIN RESOLVES FROM KETOCONAZOLE CREAM   diazepam (VALIUM) 5 MG tablet TAKE 1/2 TABLET (2.'5MG'$  TOTAL) BY MOUTH AT BEDTIME   ELIQUIS 5 MG TABS tablet TAKE 1 TABLET BY MOUTH TWICE A DAY   Flax Oil-Fish Oil-Borage Oil (FISH-FLAX-BORAGE) CAPS Take 1 capsule by mouth daily.   fluticasone (FLONASE) 50 MCG/ACT nasal spray Place 1 spray into both nostrils daily.   furosemide (LASIX) 20 MG tablet Take 1 tablet (20 mg total) by mouth daily. *MUST KEEP APPT FOR FURTHER REFILLS*   Garlic 778 MG TABS Take by mouth. DAILY   glucosamine-chondroitin 500-400 MG tablet Take 1 tablet by mouth daily.    HYDROcodone-acetaminophen (NORCO/VICODIN) 5-325 MG tablet Take 1 tablet by mouth every 6 (six) hours as needed for moderate pain.   lisinopril (ZESTRIL) 20 MG tablet Patient taking 1/2 tablet (10 mg total) by mouth daily   loratadine (CLARITIN) 10 MG tablet TAKE 1 TABLET BY MOUTH EVERY DAY   metoprolol succinate (TOPROL-XL) 100 MG 24 hr tablet Take 1.5 tablets (150 mg total) by mouth daily. Take with or immediately following a meal.   Multiple Vitamins-Minerals (EMERGEN-C IMMUNE) PACK Take 1 packet by mouth daily.    Multiple Vitamins-Minerals (MULTIVITAMIN WOMEN 50+) TABS Take 1 tablet by mouth daily.    mupirocin ointment (BACTROBAN) 2 % APPLY TOPICALLY TO AFFECTED AREA TWICE DAILY   Nutritional Supplements (ECHINACEA/GOLDEN SEAL PO) Take 1 tablet by mouth daily.    nystatin (MYCOSTATIN/NYSTOP) powder Apply 1 application topically 2 (two) times daily.   polycarbophil (FIBERCON) 625 MG tablet Take 625 mg by mouth daily.   potassium chloride (KLOR-CON) 20 MEQ packet USE 1 PACKET BY MOUTH EVERY DAY   Probiotic Product (PROBIOTIC-10 PO) Take 1 tablet by mouth daily.    Propylene Glycol (SYSTANE COMPLETE) 0.6 % SOLN Place 1 drop into both eyes 4 (four) times daily as needed.   rosuvastatin (CRESTOR) 5 MG tablet Take 5 mg by mouth at bedtime.   Turmeric (QC TUMERIC COMPLEX PO) Take by mouth. WITH CONDIN DAILY   [DISCONTINUED] amoxicillin-clavulanate (AUGMENTIN) 875-125 MG tablet Take 1 tablet by mouth every 12 (twelve) hours.   [DISCONTINUED] chlorpheniramine-HYDROcodone (TUSSIONEX PENNKINETIC ER) 10-8 MG/5ML SUER Take 5 mLs by mouth every 12 (twelve) hours as needed for cough.   [DISCONTINUED] metoprolol succinate (TOPROL-XL) 100 MG 24 hr tablet TAKE 1 TABLET BY MOUTH EVERY DAY WITH OR IMMEDIATELY FOLLOWING A MEAL   [DISCONTINUED] pantoprazole (PROTONIX) 40 MG tablet Take 1 tablet (40 mg total) by mouth daily.   [DISCONTINUED] RESTASIS 0.05 % ophthalmic emulsion INSTILL 1 DROP INTO BOTH EYES TWICE A DAY   [DISCONTINUED] triamcinolone cream (KENALOG) 0.1 % APPLY TO AFFECTED AREA TWICE A DAY     Allergies:   Dome-paste bandage [wound dressings], Flecainide, and Flecainide acetate   Social History   Socioeconomic History   Marital status: Widowed    Spouse name: [name]   Number of children: 1   Years of education: Masters   [employer] education level: Master's degree (e.g., MA, MS, MEng, MEd, MSW, MBA)  Occupational History   Occupation: Retired- Product manager: RETIRED  Tobacco Use   Smoking status:  Never   Smokeless tobacco: Never  Vaping Use   Vaping Use: Never used  Substance and Sexual Activity   Alcohol use: Not Currently   Drug use: Never   Sexual activity: Not Currently    Birth control/protection: Post-menopausal  Other Topics Concern   Not  on file  Social History Narrative   Emergency Contact: Leta Baptist (son) [phone]   Margarita Mail (friend) cell: [phone] work: [phone] *first [place] (friend) cell: [phone] home: [phone] * second POA   Who lives with you: self   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 Exercise: Pt dances regularly for shows   Seatbelts: Pt reports wearing seatbelt when in vehicles.    Sun Exposure/Protection: Pt reports not being in the sun very much   Hobbies: dancing, painting,    Working smoke alarm: yes   Home throw rugs: yes   Home free from clutter: yes   ______________________________________________________________________________________   Current Social History     Who lives at home: lives alone, retired Education officer, museum and librarian  10/25/2019    Transportation: provided by [facility] Medicare and her female friend 10/25/2019   Important Relationships & Pets: has cats and a friend that she talks to or sees daily.  Reports no family local 10/25/2019    Current Stressors: clutter in her home 10/25/2019   Other: Unable to prepare meals and needs help with keeping her home clean.  Describes house as being cluttered with narrow paths in her trailer.  No cental heat uses space heaters for the past year. Leaking kitchen causes water bill to be higher than it should be 10/25/2019   Casimer Lanius, LCSW   Clinical Social Worker                                                     Caffeine: 1-2 cup of coffee a day      Right handed       Lives alone w 4 cats                                                   Social Determinants of Health    Financial Resource Strain: Low Risk  (10/11/2018)   Overall Financial Resource Strain (CARDIA)    Difficulty of Paying Living Expenses: Not hard at all  Food Insecurity: Food Insecurity Present (10/25/2019)   Hunger Vital Sign    Worried About Running Out of Food in the Last Year: Sometimes true    Ran Out of Food in the Last Year: Sometimes true  Transportation Needs: No Transportation Needs (10/25/2019)   PRAPARE - Hydrologist (Medical): No    Lack of Transportation (Non-Medical): No  Physical Activity: Inactive (10/11/2018)   Exercise Vital Sign    Days of Exercise per Week: 0 days    Minutes of Exercise per Session: 0 min  Stress: No Stress Concern Present (10/11/2018)   [place]    Feeling of Stress : Not at all  Social Connections: Somewhat Isolated (10/11/2018)   Social Connection and Isolation Panel [NHANES]    Frequency of Communication with Friends and Family: Three times a week    Frequency of Social Gatherings with Friends and Family: Never    Attends Religious Services: Never    Active Member of [employer]  or Organizations: Yes    Attends Archivist Meetings: 1 to 4 times per year    Marital Status: Widowed     Family History: The patient's family history includes Alcohol abuse in her father; Cancer in her maternal aunt and mother; Heart disease in her maternal uncle; Kidney disease in her father.  ROS:   Please see the history of present illness.     All other systems reviewed and are negative.  EKGs/Labs/Other Studies Reviewed:    The following studies were reviewed today:   EKG:  EKG is  ordered today.  The ekg ordered today demonstrates 10/17/2022-atrial fibrillation heart rate 90 poor R wave progression left axis deviation.  Recent Labs: 03/18/2022: ALT 19; B Natriuretic Peptide 98.8; BUN 33; Creatinine, Ser 1.45; Hemoglobin 12.7; Magnesium 2.4; Platelets  291; Potassium 4.7; Sodium 137  Recent Lipid Panel    Component Value Date/Time   CHOL 224 (H) 06/09/2014 1427   TRIG 142 06/09/2014 1427   HDL 64 06/09/2014 1427   CHOLHDL 3.5 06/09/2014 1427   VLDL 28 06/09/2014 1427   LDLCALC 132 (H) 06/09/2014 1427     Physical Exam:    VS:  BP 100/64   Pulse 90   Ht 5' (1.524 m)   Wt 205 lb 12.8 oz (93.4 kg)   SpO2 96%   BMI 40.19 kg/m     Wt Readings from Last 3 Encounters:  10/17/22 205 lb 12.8 oz (93.4 kg)  03/17/22 200 lb (90.7 kg)  01/19/22 210 lb (95.3 kg)     GEN:  Well nourished, well developed in no acute distress HEENT: Normal NECK: No JVD; No carotid bruits LYMPHATICS: No lymphadenopathy CARDIAC: Irregularly irregular, no murmurs, rubs, gallops RESPIRATORY:  Clear to auscultation without rales, wheezing or rhonchi  ABDOMEN: Soft, non-tender, non-distended MUSCULOSKELETAL: Chronic lower extremity edema; No deformity  SKIN: Warm and dry NEUROLOGIC:  Alert and oriented x 3 PSYCHIATRIC:  Normal affect   ASSESSMENT:    1. Permanent atrial fibrillation (HCC)   2. Stage 3a chronic kidney disease ([place])   3. Chronic venous insufficiency     PLAN:    In order of problems listed above:  Preop cardiac evaluation - She may proceed with shoulder surgery with low overall cardiac risk. - She may hold her Eliquis for 3 days prior to surgery since she will require likely a nerve block from anesthesiology.  Since she has not had any evidence of embolic stroke or stroke from atrial fibrillation (her prior strokes were small lacunar infarcts likely not embolic) I will avoid Lovenox bridge to help reduce her overall risk of bleeding. -If she desires to go forward with the surgery she may proceed.  Perm AFIB  - Rate controlled today however had rapid ventricular response requiring hospitalization in [place] in November 2023. Meds reviewed.  They suggested increasing her metoprolol.  We will go ahead and increase her Toprol-XL  from 100 mg to 150 mg daily.  CKD 3a -stable, improved after lisinopril was decreased by nephrology.  Prior creatinine 1.45  Essential hypertension -Very well controlled on current medications as above.  Chronic anticoagulation -Continue with Eliquis.  No bleeding.  No medication side effects.  Wanted to discuss Watchman device.  Since she has a right lower extremity wound that appears to be improved after debridement, I would not refer her at this time for watchman.  I think the wound needs to be completely healed prior to considering internal cardiac device.  She does have  easy bruising.  Continue to seek counseling from wound clinic.  She has gained a significant amount of fluid weight, lymphedema.  Medication Adjustments/Labs and Tests Ordered: Current medicines are reviewed at length with the patient today.  Concerns regarding medicines are outlined above.  Orders Placed This Encounter  Procedures   EKG 12-Lead   Meds ordered this encounter  Medications   metoprolol succinate (TOPROL-XL) 100 MG 24 hr tablet    Sig: Take 1.5 tablets (150 mg total) by mouth daily. Take with or immediately following a meal.    Dispense:  135 tablet    Refill:  3    Patient Instructions  Medication Instructions:  Please increase your Metoprolol to 150 mg a day. Continue all other medications as listed.  *If you need a refill on your cardiac medications before your next appointment, please call your pharmacy*  Follow-Up: At [facility], you and your health needs are our priority.  As part of our continuing mission to provide you with exceptional heart care, we have created designated Provider Care Teams.  These Care Teams include your primary Cardiologist (physician) and Advanced Practice Providers (APPs -  Physician Assistants and Nurse Practitioners) who all work together to provide you with the care you need, when you need it.  We recommend signing up for the patient portal called  "MyChart".  Sign up information is provided on this After Visit Summary.  MyChart is used to connect with patients for Virtual Visits (Telemedicine).  Patients are able to view lab/test results, encounter notes, upcoming appointments, etc.  Non-urgent messages can be sent to your provider as well.   To learn more about what you can do with MyChart, go to [email or web address].    Your next appointment:   2 month(s)  The format for your next appointment:   In Person  Provider:   Nicholes Rough, PA-C, Melina Copa, PA-C, Ambrose Pancoast, NP, Ermalinda Barrios, PA-C, Christen Bame, NP, or Richardson Dopp, PA-C          Important Information About Sugar         Signed, [name], MD  10/17/2022 3:26 PM    [place]

## 2022-10-21 ENCOUNTER — Other Ambulatory Visit: Payer: Self-pay | Admitting: Family Medicine

## 2022-10-24 ENCOUNTER — Telehealth: Payer: Self-pay | Admitting: Nurse Practitioner

## 2022-10-24 ENCOUNTER — Telehealth: Payer: Self-pay | Admitting: *Deleted

## 2022-10-24 NOTE — Telephone Encounter (Signed)
Patient calling the office for samples of medication:   1.  What medication and dosage are you requesting samples for? ELIQUIS 5 MG TABS tablet   2.  Are you currently out of this medication? Yes

## 2022-10-24 NOTE — Telephone Encounter (Signed)
Patient called and states she left her knee high compression when [place] she is requesting to come in and get re-measured I will have Sonya call her tomorrow to set up a time.

## 2022-10-24 NOTE — Telephone Encounter (Signed)
Pt called stating she was in the donut hole.    Per Leadership, ok to give Pt two weeks worth of Eliquis, 2 boxes of Eliquis 5 mg given to patient.    Place in brown bag and secured in locked drawer for pickup tomorrow.

## 2022-10-24 NOTE — Telephone Encounter (Signed)
Patient called and left a message requesting an appt to be seen for cellulitis. Tried to call patient back and got voice mail left her a message.

## 2022-10-26 ENCOUNTER — Ambulatory Visit
Admission: RE | Admit: 2022-10-26 | Discharge: 2022-10-26 | Disposition: A | Discharge status: Home / Self Care | Payer: Medicare Other | Source: Ambulatory Visit | Attending: Orthopaedic Surgery | Admitting: Orthopaedic Surgery

## 2022-10-26 ENCOUNTER — Encounter (INDEPENDENT_AMBULATORY_CARE_PROVIDER_SITE_OTHER): Payer: Medicare Other

## 2022-10-26 DIAGNOSIS — M7989 Other specified soft tissue disorders: Secondary | ICD-10-CM

## 2022-10-26 DIAGNOSIS — M25511 Pain in right shoulder: Secondary | ICD-10-CM

## 2022-11-08 ENCOUNTER — Other Ambulatory Visit: Payer: Self-pay

## 2022-11-08 ENCOUNTER — Encounter (HOSPITAL_BASED_OUTPATIENT_CLINIC_OR_DEPARTMENT_OTHER): Payer: Self-pay

## 2022-11-08 DIAGNOSIS — Z20822 Contact with and (suspected) exposure to covid-19: Secondary | ICD-10-CM | POA: Insufficient documentation

## 2022-11-08 DIAGNOSIS — Z7901 Long term (current) use of anticoagulants: Secondary | ICD-10-CM | POA: Insufficient documentation

## 2022-11-08 DIAGNOSIS — Z79899 Other long term (current) drug therapy: Secondary | ICD-10-CM | POA: Insufficient documentation

## 2022-11-08 DIAGNOSIS — R059 Cough, unspecified: Secondary | ICD-10-CM | POA: Diagnosis present

## 2022-11-08 DIAGNOSIS — I10 Essential (primary) hypertension: Secondary | ICD-10-CM | POA: Insufficient documentation

## 2022-11-08 DIAGNOSIS — B974 Respiratory syncytial virus as the cause of diseases classified elsewhere: Secondary | ICD-10-CM | POA: Insufficient documentation

## 2022-11-08 NOTE — ED Triage Notes (Signed)
Pt states she feels she may have pneumonia. C/o weakness/ fatigue, "rasping in the lungs," cough that alternates between productive/ non-productive. Hx afib so unsure of what meds to take  States she went to [facility] 12/22 "without a mask," symptoms x1wk.

## 2022-11-09 ENCOUNTER — Other Ambulatory Visit: Payer: Self-pay | Admitting: Family Medicine

## 2022-11-09 ENCOUNTER — Emergency Department (HOSPITAL_BASED_OUTPATIENT_CLINIC_OR_DEPARTMENT_OTHER): Payer: Medicare Other | Admitting: Radiology

## 2022-11-09 ENCOUNTER — Emergency Department (HOSPITAL_BASED_OUTPATIENT_CLINIC_OR_DEPARTMENT_OTHER)
Admission: EM | Admit: 2022-11-09 | Discharge: 2022-11-09 | Disposition: A | Discharge status: Home / Self Care | Payer: Medicare Other | Attending: Emergency Medicine | Admitting: Emergency Medicine

## 2022-11-09 DIAGNOSIS — Z8701 Personal history of pneumonia (recurrent): Secondary | ICD-10-CM

## 2022-11-09 DIAGNOSIS — R059 Cough, unspecified: Secondary | ICD-10-CM | POA: Diagnosis not present

## 2022-11-09 DIAGNOSIS — B338 Other specified viral diseases: Secondary | ICD-10-CM

## 2022-11-09 LAB — RESP PANEL BY RT-PCR (RSV, FLU A&B, COVID)  RVPGX2
Influenza A by PCR: NEGATIVE
Influenza B by PCR: NEGATIVE
Resp Syncytial Virus by PCR: POSITIVE — AB
SARS Coronavirus 2 by RT PCR: NEGATIVE

## 2022-11-09 MED ORDER — AZITHROMYCIN 250 MG PO TABS
500.0000 mg | ORAL_TABLET | Freq: Once | ORAL | Status: AC
  Administered 2022-11-09: 500 mg via ORAL
  Filled 2022-11-09: qty 2

## 2022-11-09 MED ORDER — AZITHROMYCIN 250 MG PO TABS: 250.0000 mg | ORAL_TABLET | Freq: Every day | ORAL | 0 refills | Status: DC

## 2022-11-09 NOTE — ED Provider Notes (Signed)
[facility] EMERGENCY DEPT Provider Note   CSN: [id number] Arrival date & time: 11/08/22  2335     History  Chief Complaint  Patient presents with   Cough    Kristen [name] is a 82 y.o. female.  Patient is an [age]-year-old female with past medical history of hypertension, permanent atrial fibrillation, venous insufficiency.  Patient presenting today with complaints of URI symptoms.  She describes chest congestion, cough, and nasal congestion worsening over the past week.  She has been trying over-the-counter medications with little relief.  She denies any chest pain or difficulty breathing.  Patient is concerned because she has had pneumonia in the past actually caused a pneumothorax.  The history is provided by the patient.       Home Medications Prior to Admission medications   Medication Sig Start Date End Date Taking? Authorizing Provider  acetaminophen (TYLENOL) 650 MG CR tablet Take 650-1,300 mg by mouth every 8 (eight) hours as needed for pain.    [provider]  alendronate (FOSAMAX) 10 MG tablet TAKE (1) TABLET BY MOUTH PER WEEK TAKE ON EMPTY STOMACH WITH FULL GLASS OF WATER 09/11/19   [name], MD  allopurinol (ZYLOPRIM) 100 MG tablet TAKE 1 TABLET BY MOUTH EVERY DAY 06/24/22   Suzan Slick, NP  calcium carbonate (TUMS - DOSED IN MG ELEMENTAL CALCIUM) 500 MG chewable tablet Chew 1-2 tablets by mouth at bedtime.    [provider]  CALCIUM PO Take 1 tablet by mouth daily.    [provider]  Carboxymethylcellul-Glycerin (LUBRICATING EYE DROPS OP) Place 1 drop into both eyes daily as needed (dry eyes).    [provider]  cholecalciferol (VITAMIN D3) 25 MCG (1000 UT) tablet Take 1,000 Units by mouth daily.    [provider]  clindamycin (CLINDAGEL) 1 % gel APPLY TO AFFECTED AREA TWICE A DAY 04/13/18   [name], MD  colchicine 0.6 MG tablet Take 1 tablet (0.6 mg total) by mouth 2 (two) times daily.  Take twice daily until pain resolves and then take as needed for gout flare. 06/15/18   [name], MD  [employer] ANTI-FUNGAL 2 % powder APPLY TO AFFECTED AREA TWICE A DAY AFTER SKIN RESOLVES FROM KETOCONAZOLE CREAM 10/17/14   [name], MD  diazepam (VALIUM) 5 MG tablet TAKE 1/2 TABLET (2.'5MG'$  TOTAL) BY MOUTH AT BEDTIME 12/17/20   Paige, [place] J, DO  ELIQUIS 5 MG TABS tablet TAKE 1 TABLET BY MOUTH TWICE A DAY 09/06/21   Jerline Pain, MD  Flax Oil-Fish Oil-Borage Oil (FISH-FLAX-BORAGE) CAPS Take 1 capsule by mouth daily.    [provider]  fluticasone (FLONASE) 50 MCG/ACT nasal spray Place 1 spray into both nostrils daily. 09/03/21   Isla Pence, MD  furosemide (LASIX) 20 MG tablet Take 1 tablet (20 mg total) by mouth daily. *MUST KEEP APPT FOR FURTHER REFILLS* 10/11/22   Jerline Pain, MD  Garlic 627 MG TABS Take by mouth. DAILY    [provider]  glucosamine-chondroitin 500-400 MG tablet Take 1 tablet by mouth daily.     [provider]  HYDROcodone-acetaminophen (NORCO/VICODIN) 5-325 MG tablet Take 1 tablet by mouth every 6 (six) hours as needed for moderate pain. 09/17/19   Sheikh, Georgina Quint Latif, DO  lisinopril (ZESTRIL) 20 MG tablet Patient taking 1/2 tablet (10 mg total) by mouth daily    [provider]  loratadine (CLARITIN) 10 MG tablet TAKE 1 TABLET BY MOUTH EVERY DAY 03/15/21  Shary Key, DO  metoprolol succinate (TOPROL-XL) 100 MG 24 hr tablet Take 1.5 tablets (150 mg total) by mouth daily. Take with or immediately following a meal. 10/17/22   Jerline Pain, MD  Multiple Vitamins-Minerals (EMERGEN-C IMMUNE) PACK Take 1 packet by mouth daily.    [provider]  Multiple Vitamins-Minerals (MULTIVITAMIN WOMEN 50+) TABS Take 1 tablet by mouth daily.     [provider]  mupirocin ointment (BACTROBAN) 2 % APPLY TOPICALLY TO AFFECTED AREA TWICE DAILY 02/20/19   [name], MD  Nutritional Supplements (ECHINACEA/GOLDEN  SEAL PO) Take 1 tablet by mouth daily.     [provider]  nystatin (MYCOSTATIN/NYSTOP) powder Apply 1 application topically 2 (two) times daily. 04/30/20   Kinnie Feil, MD  polycarbophil (FIBERCON) 625 MG tablet Take 625 mg by mouth daily.    [provider]  potassium chloride (KLOR-CON) 20 MEQ packet USE 1 PACKET BY MOUTH EVERY DAY 08/11/20   Shary Key, DO  Probiotic Product (PROBIOTIC-10 PO) Take 1 tablet by mouth daily.     [provider]  Propylene Glycol (SYSTANE COMPLETE) 0.6 % SOLN Place 1 drop into both eyes 4 (four) times daily as needed.    [provider]  rosuvastatin (CRESTOR) 5 MG tablet Take 5 mg by mouth at bedtime. 03/29/21   [provider]  Turmeric (QC TUMERIC COMPLEX PO) Take by mouth. WITH CONDIN DAILY    [provider]      Allergies    Dome-paste bandage [wound dressings], Flecainide, and Flecainide acetate    Review of Systems   Review of Systems  All other systems reviewed and are negative.   Physical Exam Updated Vital Signs BP 106/83   Pulse 78   Temp 97.7 F (36.5 C) (Oral)   Resp 17   SpO2 94%  Physical Exam Vitals and nursing note reviewed.  Constitutional:      General: She is not in acute distress.    Appearance: She is well-developed. She is not diaphoretic.  HENT:     Head: Normocephalic and atraumatic.  Cardiovascular:     Rate and Rhythm: Normal rate and regular rhythm.     Heart sounds: No murmur heard.    No friction rub. No gallop.  Pulmonary:     Effort: Pulmonary effort is normal. No respiratory distress.     Breath sounds: Normal breath sounds. No wheezing.  Abdominal:     General: Bowel sounds are normal. There is no distension.     Palpations: Abdomen is soft.     Tenderness: There is no abdominal tenderness.  Musculoskeletal:        General: Normal range of motion.     Cervical back: Normal range of motion and neck supple.  Skin:    General: Skin is warm  and dry.  Neurological:     General: No focal deficit present.     Mental Status: She is alert and oriented to person, place, and time.     ED Results / Procedures / Treatments   Labs (all labs ordered are listed, but only abnormal results are displayed) Labs Reviewed  RESP PANEL BY RT-PCR (RSV, FLU A&B, COVID)  RVPGX2 - Abnormal; Notable for the following components:      Result Value   Resp Syncytial Virus by PCR POSITIVE (*)    All other components within normal limits    EKG None  Radiology DG Chest 2 View  Result Date: 11/09/2022 CLINICAL  DATA:  Weakness and fatigue with cough. EXAM: CHEST - 2 VIEW COMPARISON:  Mar 17, 2022 FINDINGS: The heart size and mediastinal contours are within normal limits. Mild, diffuse, chronic appearing increased interstitial lung markings are seen. There is no evidence of focal consolidation, pleural effusion or pneumothorax. Multilevel degenerative changes are seen throughout the thoracic spine. IMPRESSION: Chronic appearing increased interstitial lung markings without evidence of acute or active cardiopulmonary disease. Electronically Signed   By: Virgina Norfolk M.D.   On: 11/09/2022 01:49    Procedures Procedures    Medications Ordered in ED Medications  azithromycin (ZITHROMAX) tablet 500 mg (has no administration in time range)    ED Course/ Medical Decision Making/ A&P  Patient presenting with URI symptoms as described in the HPI.  She has had the symptoms for 1 week.  Nasal swab reveals positive results for RSV with chest x-ray showing no acute process.  Patient describes a history of pneumonia with pneumothorax and is concerned this may be turning into pneumonia.  Due to the patient's age and history and persistent symptoms, I do feel as though there is a possibility she could be developing a superimposed pneumonia and will treat with Zithromax.  Patient to continue over-the-counter medications and return as needed.  Final Clinical  Impression(s) / ED Diagnoses Final diagnoses:  None    Rx / DC Orders ED Discharge Orders     None         Veryl Speak, MD 11/09/22 [phone]

## 2022-11-09 NOTE — Discharge Instructions (Addendum)
Begin taking Zithromax as prescribed.  Take over-the-counter medications as needed for relief of symptoms.  Return to the emergency department for severe chest pain, difficulty breathing, or for other new and concerning symptoms.

## 2022-12-01 ENCOUNTER — Other Ambulatory Visit: Payer: Self-pay | Admitting: *Deleted

## 2022-12-01 MED ORDER — FUROSEMIDE 20 MG PO TABS: 20.0000 mg | ORAL_TABLET | Freq: Every day | ORAL | 1 refills | Status: DC

## 2022-12-19 DIAGNOSIS — M7989 Other specified soft tissue disorders: Secondary | ICD-10-CM

## 2022-12-19 NOTE — Progress Notes (Deleted)
Cardiology Office Note:    Date:  12/19/2022   ID:  Kristen [name], DOB 1941-07-27, MRN [id number]  PCP:  Cipriano Mile, NP   [facility] Providers Cardiologist:  [name], MD { Click to update primary MD,subspecialty MD or APP then REFRESH:1}    Referring MD: Cipriano Mile, NP   Chief Complaint: ***  History of Present Illness:    Kristen [name] is a *** 82 y.o. female with a hx of permanent atrial fibrillation on chronic anticoagulation, chronic venous insufficiency with varicose veins, small lacunar strokes, CKIaD stage IIIa. HTN, chronic HFpEF,  Previously followed by Dr. Wynonia Lawman and then Dr. Agustin Cree. She established care with Dr. Marlou Porch 08/2020. Hhistory of cardioversion. She is allergic to flecainide. Rate control strategy for a fib.   Had trouble in the past with LE edema. Has been followed by vein specialist. Had gout with diuresis. Creatinine has ranged from 1.23-1.71. CKD followed by nephrolology.   She had AF RVR requiring hospitalization in [place] in November 2023.    Last cardiology clinic visit was 10/17/2022 with Dr. Marlou Porch.  She was planning possible shoulder surgery but concerned with being in a sling for a prolonged period of time.  She asked about Watchman device.  Dr. Marlou Porch was concerned about a lower extremity wound that was improving with debridement but felt needed to be completely healed prior to considering internal cardiac device.  Has easy bruising. Metoprolol was increased from 100 to 150 mg daily and 2 month follow-up was recommended.  Today, she is here   Past Medical History:  Diagnosis Date   Abscess of abdominal cavity ([place]) 10/02/2019   Anticoagulant long-term use    elquis -- managed by cardiology   Anxiety    SITUATIONAL IN PAST   Atrial fibrillation ([place])    Chronic calculous cholecystitis    Chronic kidney disease    ckd stage 4 per lov dr Corliss Parish 09-28-2020 on chart   Chronic venous insufficiency    w/   varicose veins right worse than left waers compresiion stocking prn   Depression    SITUATIONAL IN PAST   Diastolic CHF, chronic ([place]) 2010   followed by cardiology HX OF 2010 DUE TO FLECANIDE   Diverticulosis of colon    Dyspnea    occ with heavt activity and at bedtime when tired for last few months per pt   Dysrhythmia    a-fib   Edema of both lower extremities    per pt mostly in summer time   Essential hypertension, benign    Fibrocystic breast disease    Full dentures    GERD (gastroesophageal reflux disease)    occasional tums and does not eat prior to bedtime   Gout    08-19-2019  per pt last episode DEC 2021 LEFT HAND   Hiatal hernia    History of diverticulitis 01/22/2016   History of recurrent UTIs    NONE RECNET SEES DR WRENN   Hx of cholecystectomy 10/02/2019   Hyperlipidemia    Insomnia    Migraines    OA (osteoarthritis)    knees, lower back   Permanent atrial fibrillation [facility]) cardiologist--- dr Agustin Cree   first dx 10/ 2011--- histroy DCCV 11-25-2010 by dr Wynonia Lawman (pt's previous cardiologist) and Cardiac cath 12-17-2010 no significant disease   Pneumonia 08/2019   COLLAPSED LEFT LUNG AND PNEUMONIA   Scoliosis    Stress incontinence in female     Past Surgical History:  Procedure Laterality Date  BILIARY STENT PLACEMENT N/A 09/03/2019   Procedure: BILIARY STENT PLACEMENT;  Surgeon: Clarene Essex, MD;  Location: [facility];  Service: Endoscopy;  Laterality: N/A;   BLEPHAROPLASTY Bilateral 02-22-2010  dr Georgia Lopes   upper eyelid's   both knees cortisone injection  08/18/2020   dr Noemi Chapel   BUNIONECTOMY Right 2003   CATARACT EXTRACTION W/ INTRAOCULAR LENS  IMPLANT, BILATERAL  2018   CERVICAL CONIZATION W/BX N/A 11/05/2020   Procedure: CONIZATION CERVIX WITH BIOPSY;  Surgeon: Newton Pigg, MD;  Location: [facility];  Service: Gynecology;  Laterality: N/A;   CHOLECYSTECTOMY N/A 08/22/2019   Procedure: LAPAROSCOPIC CHOLECYSTECTOMY;   Surgeon: [name], [name], MD;  Location: [facility];  Service: General;  Laterality: N/A;   ERCP N/A 09/03/2019   Procedure: ENDOSCOPIC RETROGRADE CHOLANGIOPANCREATOGRAPHY (ERCP);  Surgeon: Clarene Essex, MD;  Location: [facility];  Service: Endoscopy;  Laterality: N/A;   ESOPHAGOGASTRODUODENOSCOPY (EGD) WITH PROPOFOL N/A 12/06/2019   Procedure: ESOPHAGOGASTRODUODENOSCOPY (EGD) WITH PROPOFOL;  Surgeon: Clarene Essex, MD;  Location: [facility];  Service: Endoscopy;  Laterality: N/A;  stent removal, ERCP Scope, needs Flouro   HYSTEROSCOPY WITH D & C N/A 11/05/2020   Procedure: DILATATION AND CURETTAGE /HYSTEROSCOPY WITH MYOSURE;  Surgeon: Newton Pigg, MD;  Location: [facility];  Service: Gynecology;  Laterality: N/A;   IR SINUS/FIST TUBE CHK-NON GI  09/10/2019   IR SINUS/FIST TUBE CHK-NON GI  09/24/2019   KNEE ARTHROSCOPY Left 2002   meniscus repair   REMOVAL OF STONES  09/03/2019   Procedure: REMOVAL OF STONES;  Surgeon: Clarene Essex, MD;  Location: [facility];  Service: Endoscopy;;   SPHINCTEROTOMY  09/03/2019   Procedure: Joan Mayans;  Surgeon: Clarene Essex, MD;  [facility];  Service: Endoscopy;;   STENT REMOVAL  12/06/2019   Procedure: STENT REMOVAL;  Surgeon: Clarene Essex, MD;  Location: [facility];  Service: Endoscopy;;   TUBAL LIGATION Bilateral 1980   AND RIGHT BREAST EXCISION CYST (BENIGN)    Current Medications: No outpatient medications have been marked as taking for the 12/20/22 encounter (Appointment) with Emmaline Life, NP.     Allergies:   Dome-paste bandage [wound dressings], Flecainide, and Flecainide acetate   Social History   Socioeconomic History   Marital status: Widowed    Spouse name: [name]   Number of children: 1   Years of education: Masters   Highest education level: Master's degree (e.g., MA, MS, MEng, MEd, MSW, MBA)  Occupational History   Occupation: Retired- Product manager: RETIRED   Tobacco Use   Smoking status: Never   Smokeless tobacco: Never  Vaping Use   Vaping Use: Never used  Substance and Sexual Activity   Alcohol use: Not Currently   Drug use: Never   Sexual activity: Not Currently    Birth control/protection: Post-menopausal  Other Topics Concern   Not on file  Social History Narrative   Emergency Contact: Leta Baptist (son) [phone]   Margarita Mail (friend) cell: [phone] work: [phone] *first [place] (friend) cell: [phone] home: [phone] * second [place]   Who lives with you: self   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 Exercise: Pt dances regularly for shows   Seatbelts: Pt reports wearing seatbelt when in vehicles.    Nancy Fetter Exposure/Protection: Pt reports not being in the sun very much   Hobbies: dancing, painting,  Working smoke alarm: yes   Home throw rugs: yes   Home free from clutter: yes   ______________________________________________________________________________________   Current Social History     Who lives at home: lives alone, retired Education officer, museum and librarian  10/25/2019    Transportation: provided by [employer] and her female friend 10/25/2019   Important Relationships & Pets: has cats and a friend that she talks to or sees daily.  Reports no family local 10/25/2019    Current Stressors: clutter in her home 10/25/2019   Other: Unable to prepare meals and needs help with keeping her home clean.  Describes house as being cluttered with narrow paths in her trailer.  No cental heat uses space heaters for the past year. Leaking kitchen causes water bill to be higher than it should be 10/25/2019   Casimer Lanius, LCSW   Clinical Social Worker                                                     Caffeine: 1-2 cup of coffee a day      Right handed       Lives alone w 4 cats                                                    Social Determinants of Health   Financial Resource Strain: Low Risk  (10/11/2018)   Overall Financial Resource Strain (CARDIA)    Difficulty of Paying Living Expenses: Not hard at all  Food Insecurity: Food Insecurity Present (10/25/2019)   Hunger Vital Sign    Worried About Running Out of Food in the Last Year: Sometimes true    Ran Out of Food in the Last Year: Sometimes true  Transportation Needs: No Transportation Needs (10/25/2019)   PRAPARE - Hydrologist (Medical): No    Lack of Transportation (Non-Medical): No  Physical Activity: Inactive (10/11/2018)   Exercise Vital Sign    Days of Exercise per Week: 0 days    Minutes of Exercise per Session: 0 min  Stress: No Stress Concern Present (10/11/2018)   [place]    Feeling of Stress : Not at all  Social Connections: Somewhat Isolated (10/11/2018)   Social Connection and Isolation Panel [NHANES]    Frequency of Communication with Friends and Family: Three times a week    Frequency of Social Gatherings with Friends and Family: Never    Attends Religious Services: Never    Marine scientist or Organizations: Yes    Attends Archivist Meetings: 1 to 4 times per year    Marital Status: Widowed     Family History: The patient's ***family history includes Alcohol abuse in her father; Cancer in her maternal aunt and mother; Heart disease in her maternal uncle; Kidney disease in her father.  ROS:   Please see the history of present illness.    *** All other systems reviewed and are negative.  Labs/Other Studies Reviewed:    The following studies were reviewed today:  Carotid 06/18/21 Right Carotid: The extracranial vessels were near-normal with only minimal  wall  thickening or plaque.   Left Carotid: The extracranial vessels were near-normal with only minimal  wall               thickening or plaque.    Vertebrals:  Bilateral vertebral arteries demonstrate antegrade flow.  Subclavians: Normal flow hemodynamics were seen in bilateral subclavian               arteries.   *See table(s) above for measurements and observations.  Echo 02/29/20 1. Left ventricular ejection fraction, by estimation, is 60 to 65%. The  left ventricle has normal function. The left ventricle has no regional  wall motion abnormalities. Left ventricular diastolic parameters are  indeterminate with atrial fibrillation.   2. Right ventricular systolic function is normal. The right ventricular  size is normal. There is normal pulmonary artery systolic pressure.   3. The mitral valve is normal in structure. Mild mitral valve  regurgitation. No evidence of mitral stenosis.   4. The aortic valve is tricuspid. Aortic valve regurgitation is trivial.  Mild aortic valve sclerosis is present, with no evidence of aortic valve  stenosis.   5. The inferior vena cava is dilated in size with >50% respiratory  variability, suggesting right atrial pressure of 8 mmHg.   6. There is mild dilatation of the ascending aorta measuring 38 mm.   7. Left atrial size was moderately dilated.   8. Right atrial size was moderately dilated.  Recent Labs: 03/18/2022: ALT 19; B Natriuretic Peptide 98.8; BUN 33; Creatinine, Ser 1.45; Hemoglobin 12.7; Magnesium 2.4; Platelets 291; Potassium 4.7; Sodium 137  Recent Lipid Panel    Component Value Date/Time   CHOL 224 (H) 06/09/2014 1427   TRIG 142 06/09/2014 1427   HDL 64 06/09/2014 1427   CHOLHDL 3.5 06/09/2014 1427   VLDL 28 06/09/2014 1427   LDLCALC 132 (H) 06/09/2014 1427     Risk Assessment/Calculations:   {Does this patient have ATRIAL FIBRILLATION?[phone]}       Physical Exam:    VS:  There were no vitals taken for this visit.    Wt Readings from Last 3 Encounters:  10/17/22 205 lb 12.8 oz (93.4 kg)  03/17/22 200 lb (90.7 kg)  01/19/22 210 lb (95.3 kg)     GEN: ***  Well nourished, well developed in no acute distress HEENT: Normal NECK: No JVD; No carotid bruits CARDIAC: ***RRR, no murmurs, rubs, gallops RESPIRATORY:  Clear to auscultation without rales, wheezing or rhonchi  ABDOMEN: Soft, non-tender, non-distended MUSCULOSKELETAL:  No edema; No deformity. *** pedal pulses, ***bilaterally SKIN: Warm and dry NEUROLOGIC:  Alert and oriented x 3 PSYCHIATRIC:  Normal affect   EKG:  EKG is *** ordered today.  The ekg ordered today demonstrates ***  No BP recorded.  {Refresh Note OR Click here to enter BP  :1}***    Diagnoses:    No diagnosis found. Assessment and Plan:     Permanent atrial fibrillation on chronic anticoagulation: Hypertension: Chronic HFpEF:    {Are you ordering a CV Procedure (e.g. stress test, cath, DCCV, TEE, etc)?   Press F2        :[id number]   Disposition:  Medication Adjustments/Labs and Tests Ordered: Current medicines are reviewed at length with the patient today.  Concerns regarding medicines are outlined above.  No orders of the defined types were placed in this encounter.  No orders of the defined types were placed in this encounter.   There are no Patient Instructions on file for this visit.  Signed, Emmaline Life, NP  12/19/2022 5:10 AM    [place]

## 2022-12-20 ENCOUNTER — Ambulatory Visit: Payer: Medicare Other | Admitting: Nurse Practitioner

## 2022-12-20 ENCOUNTER — Telehealth: Payer: Self-pay

## 2022-12-20 NOTE — Telephone Encounter (Signed)
Pt called to arrange to pu a pair of knee high stockings in beige.  Reviewed pt's chart, returned call, no answer, vm full.

## 2022-12-26 ENCOUNTER — Telehealth: Payer: Self-pay | Admitting: *Deleted

## 2022-12-26 NOTE — Telephone Encounter (Signed)
   Pre-operative Risk Assessment    Patient Name: Kristen [name]  DOB: 07-25-1941 MRN: [id number]      Request for Surgical Clearance    Procedure:   RIGHT REVERSE TOTAL SHOULDER REPLACEMENT   Date of Surgery:  Clearance TBD                                Surgeon:  DR. Ophelia Charter  Surgeon's Group or Practice Name:  [facility] Phone number:  [phone] ATTN: Derek Jack EXT [id number] Fax number:  [phone]   Type of Clearance Requested:   - Medical  - Pharmacy:  Hold Apixaban (Eliquis)     Type of Anesthesia:  General WITH INTERSCALENE BLOCK   Additional requests/questions:    Jiles Prows   12/26/2022, 10:38 AM

## 2022-12-26 NOTE — Telephone Encounter (Signed)
Clearance request previously sent 10/11/22 note. Deferred to Dr Marlou Porch, he saw pt 10/17/22: "She may hold her Eliquis for 3 days prior to surgery since she will require likely a nerve block from anesthesiology. Since she has not had any evidence of embolic stroke or stroke from atrial fibrillation (her prior strokes were small lacunar infarcts likely not embolic) I will avoid Lovenox bridge to help reduce her overall risk of bleeding."

## 2022-12-26 NOTE — Telephone Encounter (Signed)
Primary Cardiologist:Mark Marlou Porch, MD  Chart reviewed as part of pre-operative protocol coverage. Because of Kristen [name]'s past medical history and time since last visit, he/she will require a follow-up visit in order to better assess preoperative cardiovascular risk.  Pre-op covering staff: -Patient has an appointment with Nicholes Rough, PA on 12/27/2022 at which time clearance will be addressed. - Please contact requesting surgeon's office via preferred method (i.e, phone, fax) to inform them of need for appointment prior to surgery.  This message will also be routed to pharmacy pool for input on holding anticoagulant agent as requested below so that this information is available at time of patient's appointment.   Emmaline Life, NP-C  12/26/2022, 11:23 AM [address] Office [phone] Fax [phone]

## 2022-12-27 ENCOUNTER — Ambulatory Visit: Payer: Medicare Other | Attending: Physician Assistant | Admitting: Physician Assistant

## 2022-12-27 ENCOUNTER — Encounter: Payer: Self-pay | Admitting: Physician Assistant

## 2022-12-27 VITALS — BP 100/50 | HR 87 | Ht 60.0 in | Wt 206.4 lb

## 2022-12-27 DIAGNOSIS — Z01810 Encounter for preprocedural cardiovascular examination: Secondary | ICD-10-CM

## 2022-12-27 DIAGNOSIS — I4821 Permanent atrial fibrillation: Secondary | ICD-10-CM

## 2022-12-27 DIAGNOSIS — I872 Venous insufficiency (chronic) (peripheral): Secondary | ICD-10-CM | POA: Diagnosis not present

## 2022-12-27 DIAGNOSIS — N1831 Chronic kidney disease, stage 3a: Secondary | ICD-10-CM | POA: Diagnosis not present

## 2022-12-27 NOTE — Patient Instructions (Addendum)
Medication Instructions:  Your physician recommends that you continue on your current medications as directed. Please refer to the Current Medication list given to you today.  *If you need a refill on your cardiac medications before your next appointment, please call your pharmacy*   Lab Work: None ordered If you have labs (blood work) drawn today and your tests are completely normal, you will receive your results only by: [place] [place] (if you have MyChart) OR A paper copy in the mail If you have any lab test that is abnormal or we need to change your treatment, we will call you to review the results.   Follow-Up: At [facility], you and your health needs are our priority.  As part of our continuing mission to provide you with exceptional heart care, we have created designated Provider Care Teams.  These Care Teams include your primary Cardiologist (physician) and Advanced Practice Providers (APPs -  Physician Assistants and Nurse Practitioners) who all work together to provide you with the care you need, when you need it.   Your next appointment:   December 2024  Provider:   [name], MD

## 2022-12-27 NOTE — Progress Notes (Signed)
Office Visit    Patient Name: Kristen [name] Date of Encounter: 12/27/2022  PCP:  Cipriano Mile, NP   [place]  Cardiologist:  [name], MD  Advanced Practice Provider:  No care team member to display Electrophysiologist:  None   HPI    Kristen [name] is a 82 y.o. female with a past medical history significant for atrial fibrillation, chronic anticoagulation with Eliquis, situational anxiety, depression, CKD presents today for preop clearance.  She has a history of allergy to flecainide which was severe.  She had problems following gallbladder surgery and was in the hospital for about a month.  She deals with chronic venous insufficiency and sees [place] vein specialist.  She also sees nephrology for chronic kidney disease stage III.  She had small lacunar stroke according to neuro. CHA2DS2-VASc score of 7.  November 2023 she was excited about going to visit her son in [place].  Ended up developing atrial fibrillation and was sent to the emergency department at [facility].  They suggested increasing her metoprolol.  She also was seeing the wound clinic for debridement.  She has right lower extremity lymphedema.  She was last seen in the clinic 10/2022 and at that time she was receiving IV antibiotics for her right leg.  She dropped a box on her lower extremity and ended up with a leg wound.  She was not likely to go through with her shoulder surgery at that time.She was however cleared at that time.  "She may hold her Eliquis for 3 days prior to surgery since she will require likely a nerve block from anesthesiology. Since she has not had any evidence of embolic stroke or stroke from atrial fibrillation (her prior strokes were small lacunar infarcts likely not embolic) I will avoid Lovenox bridge to help reduce her overall risk of bleeding."   Today, she is here for preop evaluation. 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  She was told to  elevate her legs so she was less active and lost muscle tone.  She saw one of the physicians over at [facility] and they have set her up with physical therapy.  She plans to sort out her home so that visitors can come in and work with her.  She has been falling and then cannot get back up and ends up having to call EMS to help.  This embarrasses her.  She wants to get her strength back and get her shoulder fixed so she can be more independent.  She struggles with lower leg infections and currently has a wound on her left lower leg.  She has not had any issues with her atrial fibrillation other than the episode described above November 2023.  She was given IV metoprolol in the hospital and her home dose of metoprolol was increased without further episodes.  She is excited about getting a rollator so that she can be more active in her home.  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  She also has a hobby of rescuing cats and currently has about 20 that she is rehomed.  Reports no shortness of breath nor dyspnea on exertion. Reports no chest pain, pressure, or tightness. No edema, orthopnea, PND. Reports no palpitations. '  Past Medical History    Past Medical History:  Diagnosis Date   Abscess of abdominal cavity ([place]) 10/02/2019   Anticoagulant long-term use    elquis --  managed by cardiology   Anxiety    SITUATIONAL IN PAST   Atrial fibrillation ([place])    Chronic calculous cholecystitis    Chronic kidney disease    ckd stage 4 per lov dr Corliss Parish 09-28-2020 on chart   Chronic venous insufficiency    w/  varicose veins right worse than left waers compresiion stocking prn   Depression    SITUATIONAL IN PAST   Diastolic CHF, chronic ([place]) 2010   followed by cardiology HX OF 2010 DUE TO FLECANIDE   Diverticulosis of colon    Dyspnea    occ with heavt activity and at bedtime when tired for last few months per pt   Dysrhythmia    a-fib   Edema  of both lower extremities    per pt mostly in summer time   Essential hypertension, benign    Fibrocystic breast disease    Full dentures    GERD (gastroesophageal reflux disease)    occasional tums and does not eat prior to bedtime   Gout    08-19-2019  per pt last episode DEC 2021 LEFT HAND   Hiatal hernia    History of diverticulitis 01/22/2016   History of recurrent UTIs    NONE RECNET SEES DR WRENN   Hx of cholecystectomy 10/02/2019   Hyperlipidemia    Insomnia    Migraines    OA (osteoarthritis)    knees, lower back   Permanent atrial fibrillation [facility]) cardiologist--- dr Agustin Cree   first dx 10/ 2011--- histroy DCCV 11-25-2010 by dr Wynonia Lawman (pt's previous cardiologist) and Cardiac cath 12-17-2010 no significant disease   Pneumonia 08/2019   COLLAPSED LEFT LUNG AND PNEUMONIA   Scoliosis    Stress incontinence in female    Past Surgical History:  Procedure Laterality Date   BILIARY STENT PLACEMENT N/A 09/03/2019   Procedure: BILIARY STENT PLACEMENT;  Surgeon: Clarene Essex, MD;  Location: [facility];  Service: Endoscopy;  Laterality: N/A;   BLEPHAROPLASTY Bilateral 02-22-2010  dr Georgia Lopes   upper eyelid's   both knees cortisone injection  08/18/2020   dr Noemi Chapel   BUNIONECTOMY Right 2003   CATARACT EXTRACTION W/ INTRAOCULAR LENS  IMPLANT, BILATERAL  2018   CERVICAL CONIZATION W/BX N/A 11/05/2020   Procedure: CONIZATION CERVIX WITH BIOPSY;  Surgeon: Newton Pigg, MD;  Location: [place];  Service: Gynecology;  Laterality: N/A;   CHOLECYSTECTOMY N/A 08/22/2019   Procedure: LAPAROSCOPIC CHOLECYSTECTOMY;  Surgeon: [name], [name], MD;  Location: [facility];  Service: General;  Laterality: N/A;   ERCP N/A 09/03/2019   Procedure: ENDOSCOPIC RETROGRADE CHOLANGIOPANCREATOGRAPHY (ERCP);  Surgeon: Clarene Essex, MD;  Location: [facility];  Service: Endoscopy;  Laterality: N/A;   ESOPHAGOGASTRODUODENOSCOPY (EGD) WITH PROPOFOL N/A 12/06/2019    Procedure: ESOPHAGOGASTRODUODENOSCOPY (EGD) WITH PROPOFOL;  Surgeon: Clarene Essex, MD;  Location: [facility];  Service: Endoscopy;  Laterality: N/A;  stent removal, ERCP Scope, needs Flouro   HYSTEROSCOPY WITH D & C N/A 11/05/2020   Procedure: DILATATION AND CURETTAGE /HYSTEROSCOPY WITH MYOSURE;  Surgeon: Newton Pigg, MD;  Location: [place];  Service: Gynecology;  Laterality: N/A;   IR SINUS/FIST TUBE CHK-NON GI  09/10/2019   IR SINUS/FIST TUBE CHK-NON GI  09/24/2019   KNEE ARTHROSCOPY Left 2002   meniscus repair   REMOVAL OF STONES  09/03/2019   Procedure: REMOVAL OF STONES;  Surgeon: Clarene Essex, MD;  Location: [facility];  Service: Endoscopy;;   SPHINCTEROTOMY  09/03/2019   Procedure: Joan Mayans;  Surgeon: Watt Climes,  Altamese Dilling, MD;  Location: [facility];  Service: Endoscopy;;   STENT REMOVAL  12/06/2019   Procedure: STENT REMOVAL;  Surgeon: Clarene Essex, MD;  Location: [facility];  Service: Endoscopy;;   TUBAL LIGATION Bilateral 1980   AND RIGHT BREAST EXCISION CYST (BENIGN)    Allergies  Allergies  Allergen Reactions   Dome-Paste Bandage [Wound Dressings] Other (See Comments)    bleeding   Flecainide Anaphylaxis and Other (See Comments)    Required hospitalization   Flecainide Acetate Anaphylaxis, Shortness Of Breath and Swelling     (hospitalized)   Ciprofloxacin Other (See Comments)    Tingling in fingers   EKGs/Labs/Other Studies Reviewed:   The following studies were reviewed today:  Echo 02/28/20 IMPRESSIONS     1. Left ventricular ejection fraction, by estimation, is 60 to 65%. The  left ventricle has normal function. The left ventricle has no regional  wall motion abnormalities. Left ventricular diastolic parameters are  indeterminate with atrial fibrillation.   2. Right ventricular systolic function is normal. The right ventricular  size is normal. There is normal pulmonary artery systolic pressure.   3. The mitral valve is normal in  structure. Mild mitral valve  regurgitation. No evidence of mitral stenosis.   4. The aortic valve is tricuspid. Aortic valve regurgitation is trivial.  Mild aortic valve sclerosis is present, with no evidence of aortic valve  stenosis.   5. The inferior vena cava is dilated in size with >50% respiratory  variability, suggesting right atrial pressure of 8 mmHg.   6. There is mild dilatation of the ascending aorta measuring 38 mm.   7. Left atrial size was moderately dilated.   8. Right atrial size was moderately dilated.   EKG:  EKG is  ordered today.  The ekg ordered today demonstrates Afib rate 87 bpm  Recent Labs: 03/18/2022: ALT 19; B Natriuretic Peptide 98.8; BUN 33; Creatinine, Ser 1.45; Hemoglobin 12.7; Magnesium 2.4; Platelets 291; Potassium 4.7; Sodium 137  Recent Lipid Panel    Component Value Date/Time   CHOL 224 (H) 06/09/2014 1427   TRIG 142 06/09/2014 1427   HDL 64 06/09/2014 1427   CHOLHDL 3.5 06/09/2014 1427   VLDL 28 06/09/2014 1427   LDLCALC 132 (H) 06/09/2014 1427    Risk Assessment/Calculations:   CHA2DS2-VASc Score = 7   This indicates a 11.2% annual risk of stroke. The patient's score is based upon: CHF History: 1 HTN History: 1 Diabetes History: 0 Stroke History: 2 ("she does have a couple very small strokes (lacunar) that are small enough that they may not cause any symptoms.") Vascular Disease History: 0 Age Score: 2 Gender Score: 1    Home Medications   Current Meds  Medication Sig   acetaminophen (TYLENOL) 650 MG CR tablet Take 650-1,300 mg by mouth every 8 (eight) hours as needed for pain.   alendronate (FOSAMAX) 10 MG tablet TAKE (1) TABLET BY MOUTH PER WEEK TAKE ON EMPTY STOMACH WITH FULL GLASS OF WATER   allopurinol (ZYLOPRIM) 100 MG tablet TAKE 1 TABLET BY MOUTH EVERY DAY   azithromycin (ZITHROMAX) 250 MG tablet Take 1 tablet (250 mg total) by mouth daily.   calcium carbonate (TUMS - DOSED IN MG ELEMENTAL CALCIUM) 500 MG chewable tablet  Chew 1-2 tablets by mouth at bedtime.   CALCIUM PO Take 1 tablet by mouth daily.   cholecalciferol (VITAMIN D3) 25 MCG (1000 UT) tablet Take 1,000 Units by mouth daily.   clindamycin (CLINDAGEL) 1 % gel APPLY TO AFFECTED  AREA TWICE A DAY   colchicine 0.6 MG tablet Take 1 tablet (0.6 mg total) by mouth 2 (two) times daily. Take twice daily until pain resolves and then take as needed for gout flare.   [employer] ANTI-FUNGAL 2 % powder APPLY TO AFFECTED AREA TWICE A DAY AFTER SKIN RESOLVES FROM KETOCONAZOLE CREAM   diazepam (VALIUM) 5 MG tablet TAKE 1/2 TABLET (2.5MG TOTAL) BY MOUTH AT BEDTIME   dicyclomine (BENTYL) 10 MG capsule Take 10-20 mg by mouth 3 (three) times daily as needed for spasms.   ELIQUIS 5 MG TABS tablet TAKE 1 TABLET BY MOUTH TWICE A DAY   Flax Oil-Fish Oil-Borage Oil (FISH-FLAX-BORAGE) CAPS Take 1 capsule by mouth daily.   fluticasone (FLONASE) 50 MCG/ACT nasal spray Place 1 spray into both nostrils daily.   furosemide (LASIX) 20 MG tablet Take 1 tablet (20 mg total) by mouth daily. *MUST KEEP APPT FOR FURTHER REFILLS*   Garlic 123XX123 MG TABS Take by mouth. DAILY   glucosamine-chondroitin 500-400 MG tablet Take 1 tablet by mouth daily.    HYDROcodone-acetaminophen (NORCO/VICODIN) 5-325 MG tablet Take 1 tablet by mouth every 6 (six) hours as needed for moderate pain.   lisinopril (ZESTRIL) 20 MG tablet Patient taking 1/2 tablet (10 mg total) by mouth daily   loratadine (CLARITIN) 10 MG tablet TAKE 1 TABLET BY MOUTH EVERY DAY   metoprolol succinate (TOPROL-XL) 100 MG 24 hr tablet Take 1.5 tablets (150 mg total) by mouth daily. Take with or immediately following a meal.   Multiple Vitamins-Minerals (EMERGEN-C IMMUNE) PACK Take 1 packet by mouth daily.   Multiple Vitamins-Minerals (MULTIVITAMIN WOMEN 50+) TABS Take 1 tablet by mouth daily.    mupirocin ointment (BACTROBAN) 2 % APPLY TOPICALLY TO AFFECTED AREA TWICE DAILY   Nutritional Supplements (ECHINACEA/GOLDEN SEAL PO) Take 1 tablet by  mouth daily.    nystatin (MYCOSTATIN/NYSTOP) powder Apply 1 application topically 2 (two) times daily.   polycarbophil (FIBERCON) 625 MG tablet Take 625 mg by mouth daily.   potassium chloride (KLOR-CON) 20 MEQ packet USE 1 PACKET BY MOUTH EVERY DAY   Probiotic Product (PROBIOTIC-10 PO) Take 1 tablet by mouth daily.    Propylene Glycol (SYSTANE COMPLETE) 0.6 % SOLN Place 1 drop into both eyes 4 (four) times daily as needed.   rosuvastatin (CRESTOR) 5 MG tablet Take 5 mg by mouth at bedtime.   Turmeric (QC TUMERIC COMPLEX PO) Take by mouth. WITH CONDIN DAILY     Review of Systems      All other systems reviewed and are otherwise negative except as noted above.  Physical Exam    VS:  BP (!) 100/50   Pulse 87   Ht 5' (1.524 m)   Wt 206 lb 6.4 oz (93.6 kg)   SpO2 96%   BMI 40.31 kg/m  , BMI Body mass index is 40.31 kg/m.  Wt Readings from Last 3 Encounters:  12/27/22 206 lb 6.4 oz (93.6 kg)  10/17/22 205 lb 12.8 oz (93.4 kg)  03/17/22 200 lb (90.7 kg)     GEN: Well nourished, well developed, in no acute distress. HEENT: normal. Neck: Supple, no JVD, carotid bruits, or masses. Cardiac: RRR, no murmurs, rubs, or gallops. No clubbing, cyanosis, edema.  Radials/PT 2+ and equal bilaterally.  Respiratory:  Respirations regular and unlabored, clear to auscultation bilaterally. GI: Soft, nontender, nondistended. MS: No deformity or atrophy. Skin: Warm and dry, no rash. Neuro:  Strength and sensation are intact. Psych: Normal affect.  Assessment & Plan  Preop clearance  Ms. Mah's perioperative risk of a major cardiac event is 0.4% according to the Revised Cardiac Risk Index (RCRI).  Therefore, she is at low risk for perioperative complications.   Her functional capacity is good at 5.07 METs according to the Duke Activity Status Index (DASI). Recommendations: According to ACC/AHA guidelines, no further cardiovascular testing needed.  The patient may proceed to surgery at  acceptable risk.   Antiplatelet and/or Anticoagulation Recommendations:  Eliquis (Apixaban) can be held for 3 days prior to surgery.  Please resume post op when felt to be safe.    Permanent atrial fibrillation -stable today, continue medications  Stage IIIa chronic kidney disease -creatinine 1.45, avoid nephrotoxin medications  Chronic venous insufficiency -stable, still has issues with chronic LE edema and lymphedema          Disposition: Follow up 4 months  with [name], MD or APP.  Signed, Elgie Collard, PA-C 12/27/2022, 4:47 PM [facility]

## 2022-12-28 ENCOUNTER — Other Ambulatory Visit: Payer: Self-pay | Admitting: Family

## 2022-12-30 ENCOUNTER — Telehealth: Payer: Self-pay

## 2022-12-30 NOTE — Telephone Encounter (Signed)
Pt called to inquire about lymphedema pump. Her information has been given to rep and he will call her to set this up. Pt is aware and has no further questions/concerns at this time.

## 2023-01-24 ENCOUNTER — Encounter (HOSPITAL_BASED_OUTPATIENT_CLINIC_OR_DEPARTMENT_OTHER): Payer: Medicare Other | Admitting: Internal Medicine

## 2023-02-02 ENCOUNTER — Other Ambulatory Visit: Payer: Self-pay | Admitting: Family

## 2023-02-17 ENCOUNTER — Telehealth: Payer: Self-pay | Admitting: Cardiology

## 2023-02-17 DIAGNOSIS — I4821 Permanent atrial fibrillation: Secondary | ICD-10-CM

## 2023-02-17 MED ORDER — APIXABAN 5 MG PO TABS: 5.0000 mg | ORAL_TABLET | Freq: Two times a day (BID) | ORAL | 1 refills | Status: AC

## 2023-02-17 NOTE — Telephone Encounter (Signed)
I called pt back to see why pt needed samples of Eliquis, but pt did not answer and there was not a voice mail set up.

## 2023-02-17 NOTE — Telephone Encounter (Signed)
Patient calling the office for samples of medication:   1.  What medication and dosage are you requesting samples for? ELIQUIS 5 MG TABS tablet  2.  Are you currently out of this medication?  No, patient states she has 6 tablets remaining

## 2023-02-17 NOTE — Telephone Encounter (Signed)
I have refilled her Eliquis. Would see why pt is needing samples. If copay is cost prohibitive, would encourage pt to fill out pt assistance application.

## 2023-02-20 NOTE — Telephone Encounter (Signed)
Pt called back stating that she was paying $10 for her Eliquis. I told the pt that I would leave a $10 copay card at [facility] street office front desk for pt to pick up, because pt has an secondary insurance which allows pt to use the copay card. Pt verbalized understanding.

## 2023-02-28 ENCOUNTER — Encounter (HOSPITAL_BASED_OUTPATIENT_CLINIC_OR_DEPARTMENT_OTHER): Payer: Medicare Other | Attending: General Surgery | Admitting: General Surgery

## 2023-02-28 DIAGNOSIS — I5032 Chronic diastolic (congestive) heart failure: Secondary | ICD-10-CM | POA: Diagnosis not present

## 2023-02-28 DIAGNOSIS — Z8249 Family history of ischemic heart disease and other diseases of the circulatory system: Secondary | ICD-10-CM | POA: Insufficient documentation

## 2023-02-28 DIAGNOSIS — I890 Lymphedema, not elsewhere classified: Secondary | ICD-10-CM | POA: Insufficient documentation

## 2023-02-28 DIAGNOSIS — N1832 Chronic kidney disease, stage 3b: Secondary | ICD-10-CM | POA: Diagnosis not present

## 2023-02-28 DIAGNOSIS — I872 Venous insufficiency (chronic) (peripheral): Secondary | ICD-10-CM | POA: Diagnosis present

## 2023-02-28 DIAGNOSIS — I4821 Permanent atrial fibrillation: Secondary | ICD-10-CM | POA: Diagnosis not present

## 2023-02-28 DIAGNOSIS — I130 Hypertensive heart and chronic kidney disease with heart failure and stage 1 through stage 4 chronic kidney disease, or unspecified chronic kidney disease: Secondary | ICD-10-CM | POA: Insufficient documentation

## 2023-02-28 DIAGNOSIS — Z8673 Personal history of transient ischemic attack (TIA), and cerebral infarction without residual deficits: Secondary | ICD-10-CM | POA: Diagnosis not present

## 2023-03-01 NOTE — Progress Notes (Signed)
Kristen [name], Kristen [name] ([id number]) [id number] Page 1 of 8 Visit Report for 02/28/2023 Chief Complaint Document Details Patient Name: Date of Service: Kristen [name], Kristen [name] 02/28/2023 12:30 PM Medical Record Number: [id number] Patient Account Number: [id number] Date of Birth/Sex: Treating RN: [birth year] (82 y.o. F) Primary Care Provider: Hillery [name] Other Clinician: Referring Provider: Treating Provider/Extender: Kristen [name] Weeks in Treatment: 0 Information Obtained from: Patient Chief Complaint 04/13/2022; patient is here for review of a wound on her right lateral lower leg Electronic Signature(s) Signed: 02/28/2023 1:11:36 PM By: Kristen Guess MD FACS Entered By: Kristen [name] on 02/28/2023 13:11:36 -------------------------------------------------------------------------------- HPI Details Patient Name: Date of Service: Kristen [name] 02/28/2023 12:30 PM Medical Record Number: [id number] Patient Account Number: [id number] Date of Birth/Sex: Treating RN: 1941-06-23 (82 y.o. F) Primary Care Provider: Hillery [name] Other Clinician: Referring Provider: Treating Provider/Extender: Kristen [name] Weeks in Treatment: 0 History of Present Illness HPI Description: ADMISSION 04/13/2022 This is an 82 year old woman who lives on her own. She has a longstanding history of edema in her legs saw Dr. Durwin [name] of vein and vascular on 01/09/2022. He diagnosed her with chronic venous insufficiency and lymphedema prescribed stockings. She did have venous studies that showed reflux in the right common femoral vein. The great saphenous vein was unable to be visualized no evidence of DVT or SVT For 1 reason or another the patient did not get the stockings. Unfortunately last week she suffered a fall and had a fairly clear deep skin tear on the right lateral lower leg. She is here for our review of this. She was put on clindamycin last week I think  by her primary doctor and referred here. Past medical history includes hyperlipidemia, lower extremity edema, hiatal hernia, atrial fib on Eliquis, ankle fracture, diastolic congestive heart failure, chronic kidney disease stage III, gout, scoliosis ABIs in our clinic were noncompressible bilaterally. Readmission: 04-20-2022 upon evaluation today patient appears to be doing well currently in regard to her wound in my opinion. She is actually showing signs of significant improvement which is great news. Fortunately I do not see any evidence of active infection locally or systemically at this time. No fevers, chills, nausea, vomiting, or diarrhea. 04-27-2022 upon evaluation today patient's wound is actually showing signs of improvement based on what I am seeing today the measurements are already smaller and very pleased in that regard. Fortunately I do not see any evidence of active infection locally or systemically which is great news and overall I think that we are on the right track here. No fevers, chills, nausea, vomiting, or diarrhea. 05-04-2022 upon evaluation today patient's wound is actually significantly smaller even compared to last week. We will making excellent progress and very pleased. I see no signs of active infection she did better with the Xeroform this week. 5-23 upon evaluation today patient's wound is actually very close to complete resolution. Fortunately I do not-see any evidence of active infection locally or systemically which is great news and overall I am extremely pleased with where we stand currently. No fevers, chills, nausea, vomiting, or diarrhea. 05-18-2022 upon evaluation patient's wound actually appears to be completely healed based on what I am seeing currently. I do not see any evidence of infection she is concerned about it toughen up due to the fact that is still bothering her as far as pain is concerned to a degree here. 05-25-2022 upon evaluation today patient  appears to be doing well with regard to her wound in fact this is  still completely closed. Fortunately I do not see any evidence of infection locally or systemically at this time. No fevers, chills, nausea, vomiting, or diarrhea. 02/28/2023: She made an appointment in clinic today because she had had several small wounds open up on her legs due to hitting them on moving boxes. She has not been wearing her compression stockings because of these wounds. However, all of her wounds are closed and healed today. Electronic Signature(s) Kristen [name] ([id number]) [id number] Page 2 of 8 Signed: 02/28/2023 1:12:32 PM By: Kristen Guess MD FACS Entered By: Kristen [name] on 02/28/2023 13:12:32 -------------------------------------------------------------------------------- Physical Exam Details Patient Name: Date of Service: Kristen [name], Kristen [name] 02/28/2023 12:30 PM Medical Record Number: [id number] Patient Account Number: [id number] Date of Birth/Sex: Treating RN: August 29, 1941 (82 y.o. F) Primary Care Provider: Hillery [name] Other Clinician: Referring Provider: Treating Provider/Extender: Kristen [name] Weeks in Treatment: 0 Constitutional . . . . No acute distress. Respiratory Normal work of breathing on room air. Notes 02/28/2023: She has several freshly healed sites on her bilateral lower extremities, but no active open wounds. Electronic Signature(s) Signed: 02/28/2023 1:13:28 PM By: Kristen Guess MD FACS Entered By: Kristen [name] on 02/28/2023 13:13:27 -------------------------------------------------------------------------------- Physician Orders Details Patient Name: Date of Service: [name], [name] 02/28/2023 12:30 PM Medical Record Number: [id number] Patient Account Number: [id number] Date of Birth/Sex: Treating RN: [birth year] (82 y.o. Kristen [name] Primary Care Provider: Hillery [name] Other Clinician: Referring  Provider: Treating Provider/Extender: Kristen [name] Weeks in Treatment: 0 Verbal / Phone Orders: No Diagnosis Coding ICD-10 Coding Code Description I87.2 Venous insufficiency (chronic) (peripheral) I50.32 Chronic diastolic (congestive) heart failure I48.21 Permanent atrial fibrillation N18.32 Chronic kidney disease, stage 3b Z86.73 Personal history of transient ischemic attack (TIA), and cerebral infarction without residual deficits I89.0 Lymphedema, not elsewhere classified Discharge From [facility] Services Discharge from Wound Care Center Anesthetic (In clinic) Topical Lidocaine 4% applied to wound bed Edema Control - Lymphedema / SCD / Other Avoid standing for long periods of time. Patient to wear own compression stockings every day. Exercise regularly Moisturize legs daily. - both legs every night before bed. Compression stocking or Garment 30-40 mm/Hg pressure to: - apply in the morning and remove at night. Electronic Signature(s) Signed: 02/28/2023 1:13:48 PM By: Kristen Guess MD FACS Entered By: Kristen [name] on 02/28/2023 13:13:48 Osburn, Kerrie [place] ([id number]) [id number] Page 3 of 8 -------------------------------------------------------------------------------- Problem List Details Patient Name: Date of Service: Kristen [name], Kristen [name] 02/28/2023 12:30 PM Medical Record Number: [id number] Patient Account Number: [id number] Date of Birth/Sex: Treating RN: Mar 31, 1941 (82 y.o. F) Primary Care Provider: Hillery [name] Other Clinician: Referring Provider: Treating Provider/Extender: Kristen [name] Weeks in Treatment: 0 Active Problems ICD-10 Encounter Code Description Active Date MDM Diagnosis I87.2 Venous insufficiency (chronic) (peripheral) 02/28/2023 No Yes I50.32 Chronic diastolic (congestive) heart failure 02/28/2023 No Yes I48.21 Permanent atrial fibrillation 02/28/2023 No Yes N18.32 Chronic kidney disease,  stage 3b 02/28/2023 No Yes Z86.73 Personal history of transient ischemic attack (TIA), and cerebral infarction 02/28/2023 No Yes without residual deficits I89.0 Lymphedema, not elsewhere classified 02/28/2023 No Yes Inactive Problems Resolved Problems Electronic Signature(s) Signed: 02/28/2023 12:46:51 PM By: Kristen Guess MD FACS Entered By: Kristen [name] on 02/28/2023 12:46:51 -------------------------------------------------------------------------------- Progress Note Details Patient Name: Date of Service: Kristen [name] 02/28/2023 12:30 PM Medical Record Number: [id number] Patient Account Number: [id number] Date of Birth/Sex: Treating RN: 03-Feb-1941 (82 y.o. F) Primary Care Provider: Hillery [name] Other Clinician: Referring Provider: Treating Provider/Extender: Kristen [name] Weeks in Treatment: 0  Subjective Chief Complaint Information obtained from Patient 04/13/2022; patient is here for review of a wound on her right lateral lower leg History of Present Illness (HPI) ADMISSION 04/13/2022 This is an 82 year old woman who lives on her own. She has a longstanding history of edema in her legs saw Dr. Durwin [name] of vein and vascular on 01/09/2022. He diagnosed her with chronic venous insufficiency and lymphedema prescribed stockings. She did have venous studies that showed reflux in the right common femoral vein. The great saphenous vein was unable to be visualized no evidence of DVT or SVT JAMISON, YUHASZ ([id number]) [id number] Page 4 of 8 For 1 reason or another the patient did not get the stockings. Unfortunately last week she suffered a fall and had a fairly clear deep skin tear on the right lateral lower leg. She is here for our review of this. She was put on clindamycin last week I think by her primary doctor and referred here. Past medical history includes hyperlipidemia, lower extremity edema, hiatal hernia, atrial fib on Eliquis,  ankle fracture, diastolic congestive heart failure, chronic kidney disease stage III, gout, scoliosis ABIs in our clinic were noncompressible bilaterally. Readmission: 04-20-2022 upon evaluation today patient appears to be doing well currently in regard to her wound in my opinion. She is actually showing signs of significant improvement which is great news. Fortunately I do not see any evidence of active infection locally or systemically at this time. No fevers, chills, nausea, vomiting, or diarrhea. 04-27-2022 upon evaluation today patient's wound is actually showing signs of improvement based on what I am seeing today the measurements are already smaller and very pleased in that regard. Fortunately I do not see any evidence of active infection locally or systemically which is great news and overall I think that we are on the right track here. No fevers, chills, nausea, vomiting, or diarrhea. 05-04-2022 upon evaluation today patient's wound is actually significantly smaller even compared to last week. We will making excellent progress and very pleased. I see no signs of active infection she did better with the Xeroform this week. 5-23 upon evaluation today patient's wound is actually very close to complete resolution. Fortunately I do not-see any evidence of active infection locally or systemically which is great news and overall I am extremely pleased with where we stand currently. No fevers, chills, nausea, vomiting, or diarrhea. 05-18-2022 upon evaluation patient's wound actually appears to be completely healed based on what I am seeing currently. I do not see any evidence of infection she is concerned about it toughen up due to the fact that is still bothering her as far as pain is concerned to a degree here. 05-25-2022 upon evaluation today patient appears to be doing well with regard to her wound in fact this is still completely closed. Fortunately I do not see any evidence of infection locally or  systemically at this time. No fevers, chills, nausea, vomiting, or diarrhea. 02/28/2023: She made an appointment in clinic today because she had had several small wounds open up on her legs due to hitting them on moving boxes. She has not been wearing her compression stockings because of these wounds. However, all of her wounds are closed and healed today. Patient History Information obtained from Patient. Allergies flecainide acetate (Severity: Severe, Reaction: anaphylactic shock), Dome-paste bandage, ciprofloxacin Family History Cancer - Mother, Heart Disease - Maternal Grandparents, Kidney Disease - Father, Stroke - Maternal Grandparents, No family history of Diabetes, Hereditary Spherocytosis, Hypertension, Lung Disease, Seizures, Thyroid Problems, Tuberculosis.  Social History Never smoker, Marital Status - Widowed, Alcohol Use - Never, Drug Use - No History, Caffeine Use - Daily. Medical History Eyes Patient has history of Cataracts Hematologic/Lymphatic Patient has history of Lymphedema Cardiovascular Patient has history of Arrhythmia - A-fib, Congestive Heart Failure, Hypertension - Hypertensive Heart Disease, Peripheral Venous Disease - Chronic venous insufficiency Musculoskeletal Patient has history of Gout, Osteoarthritis Hospitalization/Surgery History - cervical conization. - hysteroscopy with dandc. - Esophagogastroduodenoscopy. - Stent removal. - Biliary stent placement. - Sphincterotomy. - Cholecystectomy. - removal of stones. - Cataract extraction w/ intraocular lens implant, bilateral. - Bunionectomy. - Knee arthroscopy. - Blepharoplasty. - tubal ligation. Medical A Surgical History Notes nd Constitutional Symptoms (General Health) obese Respiratory Pneumonia Collapsed left lung Cardiovascular Varicose veins of leg with edema, right leg Cerebrovascular small vessel disease Hyperlipidemia Gastrointestinal diverticulitis hiatal hernia Genitourinary Chronic Kidney  Disease stage 3 Acute Renal Failure GERD Integumentary (Skin) Candidal intertrigo Musculoskeletal Ostopenia Flat wart Bunion of great toe of right foot Skin tag Seborrheic keratoses scoliosis Neurologic Muscle spasms of both lower extremities Psychiatric Depression (Situational in Past) Anxiety (Situational in Past Review of Systems (ROS) Ear/Nose/Mouth/Throat Denies complaints or symptoms of Chronic sinus problems or rhinitis. Endocrine Denies complaints or symptoms of Heat/cold intolerance. Integumentary (Skin) Denies complaints or symptoms of Wounds. Psychiatric Denies complaints or symptoms of Claustrophobia. Kristen [name], Kristen [name] ([id number]) [id number] Page 5 of 8 Objective Constitutional No acute distress. Vitals Time Taken: 12:49 PM, Temperature: 97.8 F, Pulse: 77 bpm, Respiratory Rate: 18 breaths/min, Blood Pressure: 140/67 mmHg. Respiratory Normal work of breathing on room air. General Notes: 02/28/2023: She has several freshly healed sites on her bilateral lower extremities, but no active open wounds. Assessment Active Problems ICD-10 Venous insufficiency (chronic) (peripheral) Chronic diastolic (congestive) heart failure Permanent atrial fibrillation Chronic kidney disease, stage 3b Personal history of transient ischemic attack (TIA), and cerebral infarction without residual deficits Lymphedema, not elsewhere classified Plan Discharge From [facility] Services: Discharge from Wound Care Center Anesthetic: (In clinic) Topical Lidocaine 4% applied to wound bed Edema Control - Lymphedema / SCD / Other: Avoid standing for long periods of time. Patient to wear own compression stockings every day. Exercise regularly Moisturize legs daily. - both legs every night before bed. Compression stocking or Garment 30-40 mm/Hg pressure to: - apply in the morning and remove at night. 02/28/2023: She made an appointment in clinic today because she had had  several small wounds occur secondary to trauma, from hitting her legs on moving boxes. These have all healed up of their own accord. She had not been wearing her compression stockings because of these wounds. I asked her to please go back to wearing her compression stockings religiously. She may follow-up as needed. Electronic Signature(s) Signed: 02/28/2023 1:14:33 PM By: Kristen Guess MD FACS Entered By: Kristen [name] on 02/28/2023 13:14:32 -------------------------------------------------------------------------------- HxROS Details Patient Name: Date of Service: Kristen [name] 02/28/2023 12:30 PM Medical Record Number: [id number] Patient Account Number: [id number] Date of Birth/Sex: Treating RN: Mar 31, 1941 (82 y.o. Kristen [name] Primary Care Provider: Hillery [name] Other Clinician: Referring Provider: Treating Provider/Extender: Kristen [name] Weeks in Treatment: 0 Information Obtained From Patient Kristen [name], Kristen [name] ([id number]) [id number] Page 6 of 8 Ear/Nose/Mouth/Throat Complaints and Symptoms: Negative for: Chronic sinus problems or rhinitis Endocrine Complaints and Symptoms: Negative for: Heat/cold intolerance Integumentary (Skin) Complaints and Symptoms: Negative for: Wounds Medical History: Past Medical History Notes: Candidal intertrigo Psychiatric Complaints and Symptoms: Negative for: Claustrophobia Medical History: Past Medical History Notes: Depression (Situational in Past) Anxiety (  Situational in Past Constitutional Symptoms (General Health) Medical History: Past Medical History Notes: obese Eyes Medical History: Positive for: Cataracts Hematologic/Lymphatic Medical History: Positive for: Lymphedema Respiratory Medical History: Past Medical History Notes: Pneumonia Collapsed left lung Cardiovascular Medical History: Positive for: Arrhythmia - A-fib; Congestive Heart Failure;  Hypertension - Hypertensive Heart Disease; Peripheral Venous Disease - Chronic venous insufficiency Past Medical History Notes: Varicose veins of leg with edema, right leg Cerebrovascular small vessel disease Hyperlipidemia Gastrointestinal Medical History: Past Medical History Notes: diverticulitis hiatal hernia Genitourinary Medical History: Past Medical History Notes: Chronic Kidney Disease stage 3 Acute Renal Failure GERD Immunological Musculoskeletal Kristen [name], Kristen [name] ([id number]) [id number] Page 7 of 8 Medical History: Positive for: Gout; Osteoarthritis Past Medical History Notes: Ostopenia Flat wart Bunion of great toe of right foot Skin tag Seborrheic keratoses scoliosis Neurologic Medical History: Past Medical History Notes: Muscle spasms of both lower extremities Oncologic HBO Extended History Items Eyes: Cataracts Immunizations Pneumococcal Vaccine: Received Pneumococcal Vaccination: Yes Received Pneumococcal Vaccination On or After 60th Birthday: Yes Tetanus Vaccine: Last tetanus shot: 02/08/2022 Implantable Devices None Hospitalization / Surgery History Type of Hospitalization/Surgery cervical conization hysteroscopy with dandc Esophagogastroduodenoscopy Stent removal Biliary stent placement Sphincterotomy Cholecystectomy removal of stones Cataract extraction w/ intraocular lens implant, bilateral Bunionectomy Knee arthroscopy Blepharoplasty tubal ligation Family and Social History Cancer: Yes - Mother; Diabetes: No; Heart Disease: Yes - Maternal Grandparents; Hereditary Spherocytosis: No; Hypertension: No; Kidney Disease: Yes - Father; Lung Disease: No; Seizures: No; Stroke: Yes - Maternal Grandparents; Thyroid Problems: No; Tuberculosis: No; Never smoker; Marital Status - Widowed; Alcohol Use: Never; Drug Use: No History; Caffeine Use: Daily; Financial Concerns: No; Food, Clothing or Shelter Needs: No; Support  System Lacking: No; Transportation Concerns: No Electronic Signature(s) Signed: 02/28/2023 2:24:35 PM By: Kristen Guess MD FACS Signed: 02/28/2023 4:08:09 PM By: Samuella Bruin Previous Signature: 02/28/2023 11:24:22 AM Version By: Kristen Guess MD FACS Entered By: Samuella Bruin on 02/28/2023 12:54:01 -------------------------------------------------------------------------------- SuperBill Details Patient Name: Date of Service: Kristen [name], Kristen [name] 02/28/2023 Medical Record Number: [id number] Patient Account Number: [id number] Date of Birth/Sex: Treating RN: Jun 10, 1941 (82 y.o. F) Primary Care Provider: Hillery [name] Other Clinician: Referring Provider: Treating Provider/Extender: Kristen [name] Weeks in Treatment: 0 Diagnosis Coding ICD-10 Codes Kristen [name], Kristen [name] ([id number]) [id number] Page 8 of 8 Code Description I87.2 Venous insufficiency (chronic) (peripheral) I50.32 Chronic diastolic (congestive) heart failure I48.21 Permanent atrial fibrillation N18.32 Chronic kidney disease, stage 3b Z86.73 Personal history of transient ischemic attack (TIA), and cerebral infarction without residual deficits I89.0 Lymphedema, not elsewhere classified Facility Procedures : CPT4 Code: [id number] Description: 99213 - WOUND CARE VISIT-LEV 3 EST PT Modifier: Quantity: 1 Physician Procedures : CPT4 Code Description Modifier [id number] 99213 - WC PHYS LEVEL 3 - EST PT ICD-10 Diagnosis Description I87.2 Venous insufficiency (chronic) (peripheral) I89.0 Lymphedema, not elsewhere classified I50.32 Chronic diastolic (congestive) heart failure N18.32  Chronic kidney disease, stage 3b Quantity: 1 Electronic Signature(s) Signed: 02/28/2023 2:24:35 PM By: Kristen Guess MD FACS Signed: 02/28/2023 4:08:09 PM By: Samuella Bruin Previous Signature: 02/28/2023 1:15:38 PM Version By: Kristen Guess MD FACS Entered By: Samuella Bruin on 02/28/2023  13:16:02

## 2023-03-01 NOTE — Progress Notes (Signed)
LALLA, LAHAM ([id number]) [id number] Page 1 of 6 Visit Report for 02/28/2023 Allergy List Details Patient Name: Date of Service: Kristen [name], Kristen [name] 02/28/2023 12:30 PM Medical Record Number: [id number] Patient Account Number: [id number] Date of Birth/Sex: Treating RN: 01-Jun-1941 (82 y.o. 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 Fredderick Phenix Primary Care Avereigh Spainhower: [name] Other Clinician: Referring Derald Lorge: Treating Taneshia Lorence/Extender: Mare Ferrari Weeks in Treatment: 0 Visit Information Patient Arrived: Cane Arrival Time: 12:49 Accompanied By: self Transfer Assistance: None Patient Identification Verified: Yes Secondary Verification Process Completed: Yes History Since Last Visit Added or deleted any medications: No Any new allergies or adverse reactions: No Had a fall or experienced change in activities of daily living that may affect risk of falls: No Signs or symptoms of abuse/neglect since last visito No Hospitalized since last visit: No Implantable device outside of the clinic excluding cellular tissue based products placed in the center since last visit: No Has Dressing in Place  as Prescribed: Yes Has Compression in Place as Prescribed: Yes Electronic Signature(s) Signed: 02/28/2023 4:08:09 PM By: Samuella Bruin Entered By: Samuella Bruin on 02/28/2023 12:49:40 -------------------------------------------------------------------------------- Clinic Level of Care Assessment Details Patient Name: Date of Service: Kristen [name], Kristen [name] 02/28/2023 12:30 PM Medical Record Number: [id number] Patient Account Number: [id number] Date of Birth/Sex: Treating RN: Oct 30, 1941 (82 y.o. Fredderick Phenix [place], [place] H ([id number]) [id number] Page 2 of 6 Primary Care Arrietty Dercole: [name] Other Clinician: Referring Raijon Lindfors: Treating Milford Cilento/Extender: Mare Ferrari Weeks in Treatment: 0 Clinic Level of Care Assessment Items TOOL 2 Quantity Score X- 1 0 Use when only an EandM is performed on the INITIAL visit ASSESSMENTS - Nursing Assessment / Reassessment X- 1 20 General Physical Exam (combine w/ comprehensive assessment (listed just below) when performed on new pt. evals) X- 1 25 Comprehensive Assessment (HX, ROS, Risk Assessments, Wounds Hx, etc.) ASSESSMENTS - Wound and Skin A ssessment / Reassessment []  - 0 Simple Wound Assessment / Reassessment - one wound []  - 0 Complex Wound Assessment / Reassessment - multiple wounds []  - 0 Dermatologic / Skin Assessment (not related to wound area) ASSESSMENTS - Ostomy and/or Continence Assessment and Care []  - 0 Incontinence Assessment and Management []  - 0 Ostomy Care Assessment and Management (repouching, etc.) PROCESS - Coordination of Care X - Simple Patient / Family Education for ongoing care 1 15 []  - 0 Complex (extensive) Patient / Family Education for ongoing care X- 1 10 Staff obtains Chiropractor, Records, T Results / Process Orders est []  - 0 Staff telephones HHA, Nursing Homes / Clarify orders / etc []  - 0 Routine Transfer to another Facility (non-emergent  condition) []  - 0 Routine Hospital Admission (non-emergent condition) X- 1 15 New Admissions / Manufacturing engineer / Ordering NPWT Apligraf, etc. , []  - 0 Emergency Hospital Admission (emergent condition) X- 1 10 Simple Discharge Coordination []  - 0 Complex (extensive) Discharge Coordination PROCESS - Special Needs []  - 0 Pediatric / Minor Patient Management []  - 0 Isolation Patient Management []  - 0 Hearing /  Language / Visual special needs  - 0 Assessment of Community assistance (transportation, D/C planning, etc.)  - 0 Additional assistance / Altered mentation  - 0 Support Surface(s) Assessment (bed, cushion, seat, etc.) INTERVENTIONS - Wound Cleansing / Measurement  - 0 Wound Imaging (photographs - any number of wounds)  - 0 Wound Tracing (instead of photographs)  - 0 Simple Wound Measurement - one wound  - 0 Complex Wound Measurement - multiple wounds  - 0 Simple Wound Cleansing - one wound  - 0 Complex Wound Cleansing - multiple wounds INTERVENTIONS - Wound Dressings  - 0 Small Wound Dressing one or multiple wounds X- 15 Medium Wound Dressing one or multiple wounds  - 0 Large Wound Dressing one or multiple wounds  - 0 Application of Medications - injection Kristen [name], Kristen [name] ([id number]) [id number] Page 3 of 6 INTERVENTIONS - Miscellaneous  - 0 External ear exam  - 0 Specimen Collection (cultures, biopsies, blood, body fluids, etc.)  - 0 Specimen(s) / Culture(s) sent or taken to Lab for analysis  - 0 Patient Transfer (multiple staff / Michiel Sites Lift / Similar devices)  - 0 Simple Staple / Suture removal (25 or less)  - 0 Complex Staple / Suture removal (26 or more)  - 0 Hypo / Hyperglycemic Management (close monitor of Blood Glucose)  - 0 Ankle / Brachial Index (ABI) - do not check if billed separately Has the patient been seen at the hospital within the last three years:  Yes Total Score: 110 Level Of Care: New/Established - Level 3 Electronic Signature(s) Signed: 02/28/2023 4:08:09 PM By: Samuella Bruin Entered By: Samuella Bruin on 02/28/2023 13:15:56 -------------------------------------------------------------------------------- Encounter Discharge Information Details Patient Name: Date of Service: Kristen [name] 02/28/2023 12:30 PM Medical Record Number: [id number] Patient Account Number: [id number] Date of Birth/Sex: Treating RN: December 09, 1940 (82 y.o. Fredderick Phenix Primary Care Cathlin Buchan: [name] Other Clinician: Referring Yarisbel Miranda: Treating Loyd Salvador/Extender: Mare Ferrari Weeks in Treatment: 0 Encounter Discharge Information Items Discharge Condition: Stable Ambulatory Status: Cane Discharge Destination: Home Transportation: Private Auto Accompanied By: self Schedule Follow-up Appointment: No Clinical Summary of Care: Patient Declined Electronic Signature(s) Signed: 02/28/2023 4:08:09 PM By: Samuella Bruin Entered By: Samuella Bruin on 02/28/2023 13:16:20 -------------------------------------------------------------------------------- Lower Extremity Assessment Details Patient Name: Date of Service: KAJAL, SCALICI 02/28/2023 12:30 PM Medical Record Number: [id number] Patient Account Number: [id number] Date of Birth/Sex: Treating RN: July 23, 1941 (82 y.o. Fredderick Phenix Primary Care Robbin Loughmiller: [name] Other Clinician: Referring Harmonee Tozer: Treating Pierson Vantol/Extender: Mare Ferrari Weeks in Treatment: 0 Edema Assessment Assessed: [Left: No] [Right: No] [Left: Edema] [Right: :] Calf Left: Right: Point of Measurement: From Medial Instep 43.5 cm 43.5 cm Ankle Left: Right: Point of Measurement: From Medial Instep 31.2 cm 34 cm Kristen [name], Kristen [name] ([id number]) [id number] Page 4 of 6 Electronic Signature(s) Signed: 02/28/2023 4:08:09 PM By:  Samuella Bruin Entered By: Samuella Bruin on 02/28/2023 12:59:33 -------------------------------------------------------------------------------- Multi Wound Chart Details Patient Name: Date of Service: Kristen [name], Kristen [name] 02/28/2023 12:30 PM Medical Record Number: [id number] Patient Account Number: [id number] Date of Birth/Sex: Treating RN: 10-11-1941 (82 y.o. F) Primary Care Tiphani Mells: [name] Other Clinician: Referring Fariha Goto: Treating Aiyla Baucom/Extender: Mare Ferrari Weeks in Treatment: 0 Vital Signs Height(in): Pulse(bpm): 77 Weight(lbs): Blood Pressure(mmHg): 140/67 Body Mass Index(BMI): Temperature(F): 97.8 Respiratory Rate(breaths/min): 18 [Treatment Notes:Wound Assessments Treatment Notes] Electronic Signature(s) Signed: 02/28/2023 1:11:26 PM By: Duanne Guess MD FACS Entered By: Duanne Guess on 02/28/2023 13:11:26 -------------------------------------------------------------------------------- Multi-Disciplinary Care Plan Details Patient Name: Date of Service: Kristen [name],  Kristen H. 02/28/2023 12:30 PM Medical Record Number: [id number] Patient Account Number: [id number] Date of Birth/Sex: Treating RN: 20-Mar-1941 (82 y.o. Fredderick Phenix Primary Care Buckley Bradly: [name] Other Clinician: Referring Tameia Rafferty: Treating Damian Buckles/Extender: Mare Ferrari Weeks in Treatment: 0 Active Inactive Electronic Signature(s) Signed: 02/28/2023 4:08:09 PM By: Gelene Mink By: Samuella Bruin on 02/28/2023 13:14:58 -------------------------------------------------------------------------------- Pain Assessment Details Patient Name: Date of Service: Kristen [name], Kristen [name] 02/28/2023 12:30 PM Medical Record Number: [id number] Patient Account Number: [id number] Date of Birth/Sex: Treating RN: 1941-05-10 (82 y.o. Fredderick Phenix Primary Care Amiria Orrison: [name] Other Clinician: Referring Grayson Pfefferle: Treating  Presley Gora/Extender: Mare Ferrari Weeks in Treatment: 0 Active Problems Location of Pain Severity and Description of Pain Patient Has Paino No Site Locations Rate the pain. Kristen [name], Kristen [name] ([id number]) [id number] Page 5 of 6 Rate the pain. Current Pain Level: 0 Pain Management and Medication Current Pain Management: Electronic Signature(s) Signed: 02/28/2023 4:08:09 PM By: Samuella Bruin Entered By: Samuella Bruin on 02/28/2023 12:58:01 -------------------------------------------------------------------------------- Patient/Caregiver Education Details Patient Name: Date of Service: Kristen [name] 4/23/2024andnbsp12:30 PM Medical Record Number: [id number] Patient Account Number: [id number] Date of Birth/Gender: Treating RN: June 15, 1941 (82 y.o. Fredderick Phenix Primary Care Physician: [name] Other Clinician: Referring Physician: Treating Physician/Extender: Mare Ferrari Weeks in Treatment: 0 Education Assessment Education Provided To: Patient Education Topics Provided Safety: Methods: Explain/Verbal Responses: Reinforcements needed, State content correctly Electronic Signature(s) Signed: 02/28/2023 4:08:09 PM By: Samuella Bruin Entered By: Samuella Bruin on 02/28/2023 13:15:12 -------------------------------------------------------------------------------- Vitals Details Patient Name: Date of Service: Kristen [name] 02/28/2023 12:30 PM Medical Record Number: [id number] Patient Account Number: [id number] Date of Birth/Sex: Treating RN: 03-Jan-1941 (82 y.o. Fredderick Phenix Primary Care Alexya Mcdaris: [name] Other Clinician: Referring Daryan Buell: Treating Helix Lafontaine/Extender: Mare Ferrari Weeks in Treatment: 0 Vital Signs Time Taken: 12:49 Temperature (F): 97.8 Pulse (bpm): 77 Respiratory Rate (breaths/min): 18 Blood Pressure (mmHg): 140/67 Reference  Range: 80 - 120 mg / dl ZYIA, KANEKO ([id number]) [id number] Page 6 of 6 Electronic Signature(s) Signed: 02/28/2023 4:08:09 PM By: Samuella Bruin Entered By: Samuella Bruin on 02/28/2023 12:49:52

## 2023-03-01 NOTE — Progress Notes (Signed)
NYLEE, BARBUTO ([id number]) [id number] Page 1 of 4 Visit Report for 02/28/2023 Abuse Risk Screen Details Patient Name: Date of Service: Kristen [name], Kristen [name] 02/28/2023 12:30 PM Medical Record Number: [id number] Patient Account Number: [id number] Date of Birth/Sex: Treating RN: 07/26/1941 (82 y.o. Fredderick Phenix Primary Care Lilienne Weins: [name] Other Clinician: Referring Jewelianna Pancoast: Treating Maeby Vankleeck/Extender: Mare Ferrari Weeks in Treatment: 0 Abuse Risk Screen Items Answer ABUSE RISK SCREEN: Has anyone close to you tried to hurt or harm you recentlyo No Do you feel uncomfortable with anyone in your familyo No Has anyone forced you do things that you didnt want to doo No Electronic Signature(s) Signed: 02/28/2023 4:08:09 PM By: Samuella Bruin Entered By: Samuella Bruin on 02/28/2023 12:54:07 -------------------------------------------------------------------------------- Activities of Daily Living Details Patient Name: Date of Service: Kristen [name], Kristen [name] 02/28/2023 12:30 PM Medical Record Number: [id number] Patient Account Number: [id number] Date of Birth/Sex: Treating RN: 12-30-1940 (82 y.o. Fredderick Phenix Primary Care Takao Lizer: [name] Other Clinician: Referring Shalia Bartko: Treating Graciano Batson/Extender: Mare Ferrari Weeks in Treatment: 0 Activities of Daily Living Items Answer Activities of Daily Living (Please select one for each item) Drive Automobile Completely Able T Medications ake Completely Able Use T elephone Completely Able Care for Appearance Completely Able Use T oilet Completely Able Bath / Shower Completely Able Dress Self Completely Able Feed Self Completely Able Walk Completely Able Get In / Out Bed Completely Able Housework Completely Able Prepare Meals Completely Able Handle Money Completely Able Shop for Self Completely Able Electronic Signature(s) Signed:  02/28/2023 4:08:09 PM By: Samuella Bruin Entered By: Samuella Bruin on 02/28/2023 12:54:33 -------------------------------------------------------------------------------- Education Screening Details Patient Name: Date of Service: Kristen [name] 02/28/2023 12:30 PM Medical Record Number: [id number] Patient Account Number: [id number] Date of Birth/Sex: Treating RN: February 13, 1941 (82 y.o. Fredderick Phenix Primary Care Miyana Mordecai: [name] Other Clinician: Referring Jayon Matton: Treating Modine Oppenheimer/Extender: Mare Ferrari Weeks in Treatment: 0 Remmers, Kerrie Buffalo ([id number]) [id number] Page 2 of 4 Primary Learner Assessed: Patient Learning Preferences/Education Level/Primary Language Learning Preference: Explanation, Demonstration, Video, Printed Material Highest Education Level: College or Above Preferred Language: Economist Language Barrier: No Translator Needed: No Memory Deficit: No Emotional Barrier: No Cultural/Religious Beliefs Affecting Medical Care: No Physical Barrier Impaired Vision: Yes Glasses Impaired Hearing: No Decreased Hand dexterity: No Knowledge/Comprehension Knowledge Level: Medium Comprehension Level: Medium Ability to understand written instructions: Medium Ability to understand verbal instructions: Medium Motivation Anxiety Level: Calm Cooperation: Cooperative Education Importance: Acknowledges Need Interest in Health Problems: Asks Questions Perception: Coherent Willingness to Engage in Self-Management Medium Activities: Readiness to Engage in Self-Management Medium Activities: Electronic Signature(s) Signed: 02/28/2023 4:08:09 PM By: Samuella Bruin Entered By: Samuella Bruin on 02/28/2023 12:55:12 -------------------------------------------------------------------------------- Fall Risk Assessment Details Patient Name: Date of Service: Kristen [name] 02/28/2023  12:30 PM Medical Record Number: [id number] Patient Account Number: [id number] Date of Birth/Sex: Treating RN: 1941-09-09 (82 y.o. Fredderick Phenix Primary Care Shakora Nordquist: [name] Other Clinician: Referring Marylu Dudenhoeffer: Treating Margan Elias/Extender: Mare Ferrari Weeks in Treatment: 0 Fall Risk Assessment Items Have you had 2 or more falls in the last 12 monthso 0 Yes Have you had any fall that resulted in injury in the last 12 monthso 0 Yes FALLS RISK SCREEN History of falling - immediate or within 3 months 25 Yes Secondary diagnosis (Do you have 2 or more medical diagnoseso) 15 Yes Ambulatory aid None/bed rest/wheelchair/nurse 0 No Crutches/cane/walker 15 Yes Furniture 0 No Intravenous therapy Access/Saline/Heparin Lock 0 No Gait/Transferring Normal/  bed rest/ wheelchair 0 No Weak (short steps with or without shuffle, stooped but able to lift head while walking, may seek 10 Yes support from furniture) Impaired (short steps with shuffle, may have difficulty arising from chair, head down, impaired 20 Yes balance) Mental Status Oriented to own ability 0 Yes Overestimates or forgets limitations 0 No Risk Level: High Risk Score: 85 Kristen [name], Kristen [name] ([id number]) [phone] [id number] Page 3 of 4 Electronic Signature(s) -------------------------------------------------------------------------------- Foot Assessment Details Patient Name: Date of Service: Kristen [name], Kristen [name] 02/28/2023 12:30 PM Medical Record Number: [id number] Patient Account Number: [id number] Date of Birth/Sex: Treating RN: 02-14-1941 (82 y.o. Fredderick Phenix Primary Care Gredmarie Delange: [name] Other Clinician: Referring Chonda Baney: Treating Ciaira Natividad/Extender: Mare Ferrari Weeks in Treatment: 0 Foot Assessment Items Site Locations + = Sensation present, - = Sensation absent, C = Callus, U = Ulcer R = Redness, W = Warmth, M = Maceration, PU =  Pre-ulcerative lesion F = Fissure, S = Swelling, D = Dryness Assessment Right: Left: Other Deformity: No No Prior Foot Ulcer: No No Prior Amputation: No No Charcot Joint: No No Ambulatory Status: Ambulatory With Help Assistance Device: Cane Gait: Unsteady Notes no hx of diabetes or neuropathy Electronic Signature(s) Signed: 02/28/2023 4:08:09 PM By: Samuella Bruin Entered By: Samuella Bruin on 02/28/2023 12:57:36 -------------------------------------------------------------------------------- Nutrition Risk Screening Details Patient Name: Date of Service: Kristen [name], Kristen [name] 02/28/2023 12:30 PM Medical Record Number: [id number] Patient Account Number: [id number] Date of Birth/Sex: Treating RN: [birth year] (82 y.o. Fredderick Phenix Primary Care Kassity Woodson: [name] Other Clinician: Referring Bralin Garry: Treating Gaines Cartmell/Extender: Mare Ferrari Weeks in Treatment: 0 Height (in): Weight (lbs): Body Mass Index (BMI): Kristen [name], Kristen [name] ([id number]) [id number] Page 4 of 4 Nutrition Risk Screening Items Score Screening NUTRITION RISK SCREEN: I have an illness or condition that made me change the kind and/or amount of food I eat 0 No I eat fewer than two meals per day 0 No I eat few fruits and vegetables, or milk products 0 No I have three or more drinks of beer, liquor or wine almost every day 0 No I have tooth or mouth problems that make it hard for me to eat 0 No I don't always have enough money to buy the food I need 0 No I eat alone most of the time 1 Yes I take three or more different prescribed or over-the-counter drugs a day 1 Yes Without wanting to, I have lost or gained 10 pounds in the last six months 0 No I am not always physically able to shop, cook and/or feed myself 0 No Nutrition Protocols Good Risk Protocol 0 No interventions needed Moderate Risk Protocol High Risk Proctocol Risk Level: Good  Risk Score: 2 Electronic Signature(s) Signed: 02/28/2023 4:08:09 PM By: Samuella Bruin Entered By: Samuella Bruin on 02/28/2023 12:57:16

## 2023-04-13 ENCOUNTER — Telehealth: Payer: Self-pay

## 2023-04-13 NOTE — Telephone Encounter (Signed)
Pt called with c/o not having heard back from [employer] rep regarding her lymphedema pump. She states she was measured back in February by him and then never heard back. We have emailed the new rep. And pt has been advised he should reach out. She will call us back early next week if she does not hear back.

## 2023-04-14 ENCOUNTER — Telehealth: Payer: Self-pay

## 2023-04-14 NOTE — Telephone Encounter (Signed)
Returned Boneta Lucks from [employer] call regarding pt needing clinical documentation for pt to proceed with lymphedema pump. Per Boneta Lucks, this needs to be within the last 6 months. I have called pt to schedule a f/u with APP for next week. She is aware of the above.

## 2023-04-17 ENCOUNTER — Telehealth: Payer: Self-pay | Admitting: *Deleted

## 2023-04-17 ENCOUNTER — Ambulatory Visit (INDEPENDENT_AMBULATORY_CARE_PROVIDER_SITE_OTHER): Payer: Medicare Other | Admitting: Physician Assistant

## 2023-04-17 VITALS — BP 113/60 | HR 100 | Temp 97.8°F

## 2023-04-17 DIAGNOSIS — L97919 Non-pressure chronic ulcer of unspecified part of right lower leg with unspecified severity: Secondary | ICD-10-CM

## 2023-04-17 DIAGNOSIS — L97929 Non-pressure chronic ulcer of unspecified part of left lower leg with unspecified severity: Secondary | ICD-10-CM

## 2023-04-17 DIAGNOSIS — I83029 Varicose veins of left lower extremity with ulcer of unspecified site: Secondary | ICD-10-CM | POA: Diagnosis not present

## 2023-04-17 DIAGNOSIS — I872 Venous insufficiency (chronic) (peripheral): Secondary | ICD-10-CM

## 2023-04-17 DIAGNOSIS — I83019 Varicose veins of right lower extremity with ulcer of unspecified site: Secondary | ICD-10-CM | POA: Diagnosis not present

## 2023-04-17 MED ORDER — CEPHALEXIN 500 MG PO CAPS: 500.0000 mg | ORAL_CAPSULE | Freq: Two times a day (BID) | ORAL | 0 refills | Status: AC

## 2023-04-17 NOTE — Progress Notes (Addendum)
Office Note     CC:  follow up Requesting Provider:  [name], NP  HPI: Kristen [name] is a 82 y.o. (08-05-1941) female who presents for follow up of venous insufficiency and lymphedema. She was last seen in March of 2023 by Dr. [name]. At the time based on her venous duplex and clinical exam she was felt to have combination of chronic venous insufficiency and lymphedema. She did not have any significant superficial venous reflux.   She has had swelling for many years. Right leg has been more significant than the left. She does not have any prior history of DVT. She has had some injections of her veins at [facility] but no prior ablation. She has been elevating her legs but she says over the past two months she has had an increase in swelling of her legs that is not improved with elevation. She has had several recurrent episodes of cellulitis with open ulcerations on her legs. She generally goes to the ER for management of this. She says she was going to the wound care center but when she developed new ulcers over the past 2-3 weeks she tried to contact them for an appointment and per the patient was told it would be 6-8 weeks before she could get an appointment. She therefore is here today to see if she can get treatment for her leg wounds and antibiotics. She has not been able to wear her compression stockings because of the increased swelling and wounds. She also reports extensive weeping that has been saturating her stockings or socks. She would like to get lymphedema pumps to further help her symptoms but she says she has had a struggle getting the [employer] company to follow through with obtaining them. She lives independently and explains that she really struggles with mobility and taking care of her wounds herself. She denies any fever or chills.   Past Medical History:  Diagnosis Date   Abscess of abdominal cavity (HCC) 10/02/2019   Anticoagulant long-term use    elquis -- managed by  cardiology   Anxiety    SITUATIONAL IN PAST   Atrial fibrillation (HCC)    Chronic calculous cholecystitis    Chronic kidney disease    ckd stage 4 per lov dr [name] 09-28-2020 on chart   Chronic venous insufficiency    w/  varicose veins right worse than left waers compresiion stocking prn   Depression    SITUATIONAL IN PAST   Diastolic CHF, chronic (HCC) 2010   followed by cardiology HX OF 2010 DUE TO FLECANIDE   Diverticulosis of colon    Dyspnea    occ with heavt activity and at bedtime when tired for last few months per pt   Dysrhythmia    a-fib   Edema of both lower extremities    per pt mostly in summer time   Essential hypertension, benign    Fibrocystic breast disease    Full dentures    GERD (gastroesophageal reflux disease)    occasional tums and does not eat prior to bedtime   Gout    08-19-2019  per pt last episode DEC 2021 LEFT HAND   Hiatal hernia    History of diverticulitis 01/22/2016   History of recurrent UTIs    NONE RECNET SEES DR WRENN   Hx of cholecystectomy 10/02/2019   Hyperlipidemia    Insomnia    Migraines    OA (osteoarthritis)    knees, lower back   Permanent atrial fibrillation (  [facility]) cardiologist--- [name]   first dx 10/ 2011--- histroy DCCV 11-25-2010 by [name] (pt's previous cardiologist) and Cardiac cath 12-17-2010 no significant disease   Pneumonia 08/2019   COLLAPSED LEFT LUNG AND PNEUMONIA   Scoliosis    Stress incontinence in female     Past Surgical History:  Procedure Laterality Date   BILIARY STENT PLACEMENT N/A 09/03/2019   Procedure: BILIARY STENT PLACEMENT;  Surgeon: [name], MD;  Location: [facility];  Service: Endoscopy;  Laterality: N/A;   BLEPHAROPLASTY Bilateral 02-22-2010  [name]   upper eyelid's   both knees cortisone injection  08/18/2020   [name]   BUNIONECTOMY Right 2003   CATARACT EXTRACTION W/ INTRAOCULAR LENS  IMPLANT, BILATERAL  2018   CERVICAL CONIZATION W/BX N/A  11/05/2020   Procedure: CONIZATION CERVIX WITH BIOPSY;  Surgeon: [name], MD;  Location: [facility];  Service: Gynecology;  Laterality: N/A;   CHOLECYSTECTOMY N/A 08/22/2019   Procedure: LAPAROSCOPIC CHOLECYSTECTOMY;  Surgeon: [name], [name], MD;  Location: [facility];  Service: General;  Laterality: N/A;   ERCP N/A 09/03/2019   Procedure: ENDOSCOPIC RETROGRADE CHOLANGIOPANCREATOGRAPHY (ERCP);  Surgeon: [name], MD;  Location: [facility];  Service: Endoscopy;  Laterality: N/A;   ESOPHAGOGASTRODUODENOSCOPY (EGD) WITH PROPOFOL N/A 12/06/2019   Procedure: ESOPHAGOGASTRODUODENOSCOPY (EGD) WITH PROPOFOL;  Surgeon: [name], MD;  Location: [facility];  Service: Endoscopy;  Laterality: N/A;  stent removal, ERCP Scope, needs Flouro   HYSTEROSCOPY WITH D & C N/A 11/05/2020   Procedure: DILATATION AND CURETTAGE /HYSTEROSCOPY WITH MYOSURE;  Surgeon: [name], MD;  Location: [facility];  Service: Gynecology;  Laterality: N/A;   IR SINUS/FIST TUBE CHK-NON GI  09/10/2019   IR SINUS/FIST TUBE CHK-NON GI  09/24/2019   KNEE ARTHROSCOPY Left 2002   meniscus repair   REMOVAL OF STONES  09/03/2019   Procedure: REMOVAL OF STONES;  Surgeon: [name], MD;  Location: [facility];  Service: Endoscopy;;   SPHINCTEROTOMY  09/03/2019   Procedure: Dennison Mascot;  Surgeon: [name], MD;  Location: [facility];  Service: Endoscopy;;   STENT REMOVAL  12/06/2019   Procedure: STENT REMOVAL;  Surgeon: [name], MD;  Location: [facility];  Service: Endoscopy;;   TUBAL LIGATION Bilateral 1980   AND RIGHT BREAST EXCISION CYST (BENIGN)    Social History   Socioeconomic History   Marital status: Widowed    Spouse name: Dimas Aguas   Number of children: 1   Years of education: Masters   Highest education level: Master's degree (e.g., MA, MS, MEng, MEd, MSW, MBA)  Occupational History   Occupation: Retired- Magazine features editor: RETIRED   Tobacco Use   Smoking status: Never   Smokeless tobacco: Never  Vaping Use   Vaping Use: Never used  Substance and Sexual Activity   Alcohol use: Not Currently   Drug use: Never   Sexual activity: Not Currently    Birth control/protection: Post-menopausal  Other Topics Concern   Not on file  Social History Narrative   Emergency Contact: Elias Else (son) [phone]   Tristan Schroeder (friend) cell: [phone] work: [phone] *first POA   Izora Gala (friend) cell: [phone] home: [phone] * second POA   Who lives with you: self   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 Exercise: Pt dances regularly for shows   Seatbelts: Pt reports wearing seatbelt when  in vehicles.    Sun Exposure/Protection: Pt reports not being in the sun very much   Hobbies: dancing, painting,    Working smoke alarm: yes   Home throw rugs: yes   Home free from clutter: yes   ______________________________________________________________________________________   Current Social History     Who lives at home: lives alone, retired Engineer, site and librarian  10/25/2019    Transportation: provided by [facility] Medicare and her female friend 10/25/2019   Important Relationships & Pets: has cats and a friend that she talks to or sees daily.  Reports no family local 10/25/2019    Current Stressors: clutter in her home 10/25/2019   Other: Unable to prepare meals and needs help with keeping her home clean.  Describes house as being cluttered with narrow paths in her trailer.  No cental heat uses space heaters for the past year. Leaking kitchen causes water bill to be higher than it should be 10/25/2019   Sammuel Hines, LCSW   Clinical Social Worker                                                     Caffeine: 1-2 cup of coffee a day      Right handed       Lives alone w 4 cats                                                    Social Determinants of Health   Financial Resource Strain: Low Risk  (10/11/2018)   Overall Financial Resource Strain (CARDIA)    Difficulty of Paying Living Expenses: Not hard at all  Food Insecurity: Food Insecurity Present (10/25/2019)   Hunger Vital Sign    Worried About Running Out of Food in the Last Year: Sometimes true    Ran Out of Food in the Last Year: Sometimes true  Transportation Needs: No Transportation Needs (10/25/2019)   PRAPARE - Administrator, Civil Service (Medical): No    Lack of Transportation (Non-Medical): No  Physical Activity: Inactive (10/11/2018)   Exercise Vital Sign    Days of Exercise per Week: 0 days    Minutes of Exercise per Session: 0 min  Stress: No Stress Concern Present (10/11/2018)   [employer] of Occupational Health - Occupational Stress Questionnaire    Feeling of Stress : Not at all  Social Connections: Somewhat Isolated (10/11/2018)   Social Connection and Isolation Panel [NHANES]    Frequency of Communication with Friends and Family: Three times a week    Frequency of Social Gatherings with Friends and Family: Never    Attends Religious Services: Never    Database administrator or Organizations: Yes    Attends Banker Meetings: 1 to 4 times per year    Marital Status: Widowed  Intimate Partner Violence: Not At Risk (10/11/2018)   Humiliation, Afraid, Rape, and Kick questionnaire    Fear of Current or Ex-Partner: No    Emotionally Abused: No    Physically Abused: No    Sexually Abused: No    Family History  Problem Relation Age of Onset   Cancer Mother  ovarian   Kidney disease Father    Alcohol abuse Father    Cancer Maternal Aunt        lung- smoker   Heart disease Maternal Uncle     Current Outpatient Medications  Medication Sig Dispense Refill   acetaminophen (TYLENOL) 650 MG CR tablet Take 650-1,300 mg by mouth every 8 (eight) hours as needed for pain.     alendronate (FOSAMAX) 10  MG tablet TAKE (1) TABLET BY MOUTH PER WEEK TAKE ON EMPTY STOMACH WITH FULL GLASS OF WATER 4 tablet 10   allopurinol (ZYLOPRIM) 100 MG tablet TAKE 1 TABLET BY MOUTH EVERY DAY 90 tablet 1   apixaban (ELIQUIS) 5 MG TABS tablet Take 1 tablet (5 mg total) by mouth 2 (two) times daily. 180 tablet 1   azithromycin (ZITHROMAX) 250 MG tablet Take 1 tablet (250 mg total) by mouth daily. 4 tablet 0   calcium carbonate (TUMS - DOSED IN MG ELEMENTAL CALCIUM) 500 MG chewable tablet Chew 1-2 tablets by mouth at bedtime.     CALCIUM PO Take 1 tablet by mouth daily.     cholecalciferol (VITAMIN D3) 25 MCG (1000 UT) tablet Take 1,000 Units by mouth daily.     clindamycin (CLINDAGEL) 1 % gel APPLY TO AFFECTED AREA TWICE A DAY 30 g 0   colchicine 0.6 MG tablet Take 1 tablet (0.6 mg total) by mouth 2 (two) times daily. Take twice daily until pain resolves and then take as needed for gout flare. 60 tablet 1   [facility] ANTI-FUNGAL 2 % powder APPLY TO AFFECTED AREA TWICE A DAY AFTER SKIN RESOLVES FROM KETOCONAZOLE CREAM 85 g 1   diazepam (VALIUM) 5 MG tablet TAKE 1/2 TABLET (2.5MG  TOTAL) BY MOUTH AT BEDTIME 30 tablet 0   dicyclomine (BENTYL) 10 MG capsule Take 10-20 mg by mouth 3 (three) times daily as needed for spasms.     Flax Oil-Fish Oil-Borage Oil (FISH-FLAX-BORAGE) CAPS Take 1 capsule by mouth daily.     fluticasone (FLONASE) 50 MCG/ACT nasal spray Place 1 spray into both nostrils daily. 15.8 mL 2   furosemide (LASIX) 20 MG tablet Take 1 tablet (20 mg total) by mouth daily. *MUST KEEP APPT FOR FURTHER REFILLS* 90 tablet 1   Garlic 100 MG TABS Take by mouth. DAILY     glucosamine-chondroitin 500-400 MG tablet Take 1 tablet by mouth daily.      HYDROcodone-acetaminophen (NORCO/VICODIN) 5-325 MG tablet Take 1 tablet by mouth every 6 (six) hours as needed for moderate pain. 10 tablet 0   lisinopril (ZESTRIL) 20 MG tablet Patient taking 1/2 tablet (10 mg total) by mouth daily     loratadine (CLARITIN) 10 MG tablet TAKE 1  TABLET BY MOUTH EVERY DAY 90 tablet 1   metoprolol succinate (TOPROL-XL) 100 MG 24 hr tablet Take 1.5 tablets (150 mg total) by mouth daily. Take with or immediately following a meal. 135 tablet 3   Multiple Vitamins-Minerals (EMERGEN-C IMMUNE) PACK Take 1 packet by mouth daily.     Multiple Vitamins-Minerals (MULTIVITAMIN WOMEN 50+) TABS Take 1 tablet by mouth daily.      mupirocin ointment (BACTROBAN) 2 % APPLY TOPICALLY TO AFFECTED AREA TWICE DAILY 22 g 10   Nutritional Supplements (ECHINACEA/GOLDEN SEAL PO) Take 1 tablet by mouth daily.      nystatin (MYCOSTATIN/NYSTOP) powder Apply 1 application topically 2 (two) times daily. 15 g 0   polycarbophil (FIBERCON) 625 MG tablet Take 625 mg by mouth daily.     potassium chloride (  KLOR-CON) 20 MEQ packet USE 1 PACKET BY MOUTH EVERY DAY 90 packet 3   Probiotic Product (PROBIOTIC-10 PO) Take 1 tablet by mouth daily.      Propylene Glycol (SYSTANE COMPLETE) 0.6 % SOLN Place 1 drop into both eyes 4 (four) times daily as needed.     rosuvastatin (CRESTOR) 5 MG tablet Take 5 mg by mouth at bedtime.     Turmeric (QC TUMERIC COMPLEX PO) Take by mouth. WITH CONDIN DAILY     No current facility-administered medications for this visit.    Allergies  Allergen Reactions   Dome-Paste Bandage [Wound Dressings] Other (See Comments)    bleeding   Flecainide Anaphylaxis and Other (See Comments)    Required hospitalization   Flecainide Acetate Anaphylaxis, Shortness Of Breath and Swelling     (hospitalized)   Ciprofloxacin Other (See Comments)    Tingling in fingers     REVIEW OF SYSTEMS:  [X]  denotes positive finding, [ ]  denotes negative finding Cardiac  Comments:  Chest pain or chest pressure:    Shortness of breath upon exertion:    Short of breath when lying flat:    Irregular heart rhythm:        Vascular    Pain in calf, thigh, or hip brought on by ambulation:    Pain in feet at night that wakes you up from your sleep:     Blood clot in  your veins:    Leg swelling:  X       Pulmonary    Oxygen at home:    Productive cough:     Wheezing:         Neurologic    Sudden weakness in arms or legs:     Sudden numbness in arms or legs:     Sudden onset of difficulty speaking or slurred speech:    Temporary loss of vision in one eye:     Problems with dizziness:         Gastrointestinal    Blood in stool:     Vomited blood:         Genitourinary    Burning when urinating:     Blood in urine:        Psychiatric    Major depression:         Hematologic    Bleeding problems:    Problems with blood clotting too easily:        Skin    Rashes or ulcers:        Constitutional    Fever or chills:      PHYSICAL EXAMINATION:  Vitals:   04/17/23 1359  BP: 113/60  Pulse: 100  Temp: 97.8 F (36.6 C)  TempSrc: Temporal  SpO2: 92%    General:  WDWN in NAD; vital signs documented above Gait: Not observed HENT: WNL, normocephalic Pulmonary: normal non-labored breathing , without wheezing Cardiac: regular HR Abdomen: obese Vascular Exam/Pulses: Doppler DP and PT pulses bilaterally Extremities: without ischemic changes, without Gangrene, cellulitis of the right lower leg; with 3 open wounds of distal right leg weeping and some purulent drainage, and left anterior leg appears more of a superficial abrasion, bilateral non pitting edema consistent with lymphedema. 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 Elephantiasis bilaterally with scaling skin changes     Musculoskeletal: no muscle wasting or atrophy  Neurologic: A&O X 3 Psychiatric:  The pt has Normal affect.  ASSESSMENT/PLAN:: 82 y.o. female here for follow  up for venous insufficiency and lymphedema. She presents with some weeping wounds on BLE, Right > left. The right appear slightly infected with some cellulitis and purulent drainage from the wounds. Due to the wounds she has been unable to wear compression  stockings - encourage her to continue her elevation, compression stockings, refraining from prolonged sitting or standing - Encourage exercise regimen - She has long history of persistent swelling with lymphedema. She has attempted compression, elevation and exercise for over 1 year with no significant improvement. I am prescribing her a lymphedema pump as well - Rx for Kefliex 500 mg BID x 14 days #28 sent to her pharmacy - Unna boot placed on RLE today. ACE bandage compression to LLE - She will return in 1 week for unna boot change/ wound evaluation    Graceann Congress, PA-C [facility] [phone]  Clinic MD:   Myra Gianotti

## 2023-04-17 NOTE — Telephone Encounter (Signed)
Patient called (was on triage line) inquiring the time of her appointment today.  I called patient [phone] and left a message on voicemail that her appointment was today, June 10 at 1:30.

## 2023-04-18 ENCOUNTER — Encounter: Payer: Self-pay | Admitting: Physician Assistant

## 2023-04-24 ENCOUNTER — Ambulatory Visit (INDEPENDENT_AMBULATORY_CARE_PROVIDER_SITE_OTHER): Payer: Medicare Other | Admitting: Physician Assistant

## 2023-04-24 ENCOUNTER — Encounter: Payer: Self-pay | Admitting: Physician Assistant

## 2023-04-24 VITALS — BP 122/74 | HR 74 | Temp 97.5°F | Resp 18 | Ht 60.0 in | Wt 205.0 lb

## 2023-04-24 DIAGNOSIS — I872 Venous insufficiency (chronic) (peripheral): Secondary | ICD-10-CM | POA: Diagnosis not present

## 2023-04-24 DIAGNOSIS — L97919 Non-pressure chronic ulcer of unspecified part of right lower leg with unspecified severity: Secondary | ICD-10-CM

## 2023-04-24 DIAGNOSIS — I83019 Varicose veins of right lower extremity with ulcer of unspecified site: Secondary | ICD-10-CM | POA: Diagnosis not present

## 2023-04-24 DIAGNOSIS — I83891 Varicose veins of right lower extremities with other complications: Secondary | ICD-10-CM | POA: Diagnosis not present

## 2023-04-24 NOTE — Progress Notes (Unsigned)
Office Note     CC:  follow up Requesting Provider:  [name], NP  HPI: Kristen [name] is a 82 y.o. (June 03, 1941) female who presents for follow up Unna boot change. She did not remove compression dressing from her left leg since her last week visit and it had fallen to low calf. Says very uncomfortable. Otherwise has been trying to elevate. Has not heard from [employer] regarding lymphatic pumps. She is taking her antibiotics as prescribed. Denies any fever or chills. No changes in lower extremity pain.   Past Medical History:  Diagnosis Date   Abscess of abdominal cavity (HCC) 10/02/2019   Anticoagulant long-term use    elquis -- managed by cardiology   Anxiety    SITUATIONAL IN PAST   Atrial fibrillation (HCC)    Chronic calculous cholecystitis    Chronic kidney disease    ckd stage 4 per lov dr [name] 09-28-2020 on chart   Chronic venous insufficiency    w/  varicose veins right worse than left waers compresiion stocking prn   Depression    SITUATIONAL IN PAST   Diastolic CHF, chronic (HCC) 2010   followed by cardiology HX OF 2010 DUE TO FLECANIDE   Diverticulosis of colon    Dyspnea    occ with heavt activity and at bedtime when tired for last few months per pt   Dysrhythmia    a-fib   Edema of both lower extremities    per pt mostly in summer time   Essential hypertension, benign    Fibrocystic breast disease    Full dentures    GERD (gastroesophageal reflux disease)    occasional tums and does not eat prior to bedtime   Gout    08-19-2019  per pt last episode DEC 2021 LEFT HAND   Hiatal hernia    History of diverticulitis 01/22/2016   History of recurrent UTIs    NONE RECNET SEES DR WRENN   Hx of cholecystectomy 10/02/2019   Hyperlipidemia    Insomnia    Migraines    OA (osteoarthritis)    knees, lower back   Permanent atrial fibrillation [facility]) cardiologist--- [name]   first dx 10/ 2011--- histroy DCCV 11-25-2010 by [name] (pt's  previous cardiologist) and Cardiac cath 12-17-2010 no significant disease   Pneumonia 08/2019   COLLAPSED LEFT LUNG AND PNEUMONIA   Scoliosis    Stress incontinence in female     Past Surgical History:  Procedure Laterality Date   BILIARY STENT PLACEMENT N/A 09/03/2019   Procedure: BILIARY STENT PLACEMENT;  Surgeon: [name], MD;  Location: [facility];  Service: Endoscopy;  Laterality: N/A;   BLEPHAROPLASTY Bilateral 02-22-2010  [name]   upper eyelid's   both knees cortisone injection  08/18/2020   [name]   BUNIONECTOMY Right 2003   CATARACT EXTRACTION W/ INTRAOCULAR LENS  IMPLANT, BILATERAL  2018   CERVICAL CONIZATION W/BX N/A 11/05/2020   Procedure: CONIZATION CERVIX WITH BIOPSY;  Surgeon: [name], MD;  Location: [facility];  Service: Gynecology;  Laterality: N/A;   CHOLECYSTECTOMY N/A 08/22/2019   Procedure: LAPAROSCOPIC CHOLECYSTECTOMY;  Surgeon: [name], [name], MD;  Location: [facility];  Service: General;  Laterality: N/A;   ERCP N/A 09/03/2019   Procedure: ENDOSCOPIC RETROGRADE CHOLANGIOPANCREATOGRAPHY (ERCP);  Surgeon: [name], MD;  Location: [facility];  Service: Endoscopy;  Laterality: N/A;   ESOPHAGOGASTRODUODENOSCOPY (EGD) WITH PROPOFOL N/A 12/06/2019   Procedure: ESOPHAGOGASTRODUODENOSCOPY (EGD) WITH PROPOFOL;  Surgeon: [name],  MD;  Location: [facility];  Service: Endoscopy;  Laterality: N/A;  stent removal, ERCP Scope, needs Flouro   HYSTEROSCOPY WITH D & C N/A 11/05/2020   Procedure: DILATATION AND CURETTAGE /HYSTEROSCOPY WITH MYOSURE;  Surgeon: [name], MD;  Location: [facility] [place];  Service: Gynecology;  Laterality: N/A;   IR SINUS/FIST TUBE CHK-NON GI  09/10/2019   IR SINUS/FIST TUBE CHK-NON GI  09/24/2019   KNEE ARTHROSCOPY Left 2002   meniscus repair   REMOVAL OF STONES  09/03/2019   Procedure: REMOVAL OF STONES;  Surgeon: [name], MD;  Location: [facility];  Service:  Endoscopy;;   SPHINCTEROTOMY  09/03/2019   Procedure: Dennison Mascot;  Surgeon: [name], MD;  Location: [facility];  Service: Endoscopy;;   STENT REMOVAL  12/06/2019   Procedure: STENT REMOVAL;  Surgeon: [name], MD;  Location: [facility];  Service: Endoscopy;;   TUBAL LIGATION Bilateral 1980   AND RIGHT BREAST EXCISION CYST (BENIGN)    Social History   Socioeconomic History   Marital status: Widowed    Spouse name: Dimas Aguas   Number of children: 1   Years of education: Masters   Highest education level: Master's degree (e.g., MA, MS, MEng, MEd, MSW, MBA)  Occupational History   Occupation: Retired- Magazine features editor: RETIRED  Tobacco Use   Smoking status: Never   Smokeless tobacco: Never  Vaping Use   Vaping Use: Never used  Substance and Sexual Activity   Alcohol use: Not Currently   Drug use: Never   Sexual activity: Not Currently    Birth control/protection: Post-menopausal  Other Topics Concern   Not on file  Social History Narrative   Emergency Contact: Elias Else (son) [phone]   Tristan Schroeder (friend) cell: [phone] work: [phone] *first POA   Izora Gala (friend) cell: [phone] home: [phone] * second POA   Who lives with you: self   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 Exercise: Pt dances regularly for shows   Seatbelts: Pt reports wearing seatbelt when in vehicles.    Sun Exposure/Protection: Pt reports not being in the sun very much   Hobbies: dancing, painting,    Working smoke alarm: yes   Home throw rugs: yes   Home free from clutter: yes   ______________________________________________________________________________________   Current Social History     Who lives at home: lives alone, retired Engineer, site and librarian  10/25/2019    Transportation: provided by [facility] and her female friend 10/25/2019   Important Relationships & Pets: has  cats and a friend that she talks to or sees daily.  Reports no family local 10/25/2019    Current Stressors: clutter in her home 10/25/2019   Other: Unable to prepare meals and needs help with keeping her home clean.  Describes house as being cluttered with narrow paths in her trailer.  No cental heat uses space heaters for the past year. Leaking kitchen causes water bill to be higher than it should be 10/25/2019   Sammuel Hines, LCSW   Clinical Social Worker                                                     Caffeine: 1-2 cup of coffee a  day      Right handed       Lives alone w 4 cats                                                   Social Determinants of Health   Financial Resource Strain: Low Risk  (10/11/2018)   Overall Financial Resource Strain (CARDIA)    Difficulty of Paying Living Expenses: Not hard at all  Food Insecurity: Food Insecurity Present (10/25/2019)   Hunger Vital Sign    Worried About Running Out of Food in the Last Year: Sometimes true    Ran Out of Food in the Last Year: Sometimes true  Transportation Needs: No Transportation Needs (10/25/2019)   PRAPARE - Administrator, Civil Service (Medical): No    Lack of Transportation (Non-Medical): No  Physical Activity: Inactive (10/11/2018)   Exercise Vital Sign    Days of Exercise per Week: 0 days    Minutes of Exercise per Session: 0 min  Stress: No Stress Concern Present (10/11/2018)   [employer] of Occupational Health - Occupational Stress Questionnaire    Feeling of Stress : Not at all  Social Connections: Somewhat Isolated (10/11/2018)   Social Connection and Isolation Panel [NHANES]    Frequency of Communication with Friends and Family: Three times a week    Frequency of Social Gatherings with Friends and Family: Never    Attends Religious Services: Never    Database administrator or Organizations: Yes    Attends Banker Meetings: 1 to 4 times per year    Marital Status:  Widowed  Intimate Partner Violence: Not At Risk (10/11/2018)   Humiliation, Afraid, Rape, and Kick questionnaire    Fear of Current or Ex-Partner: No    Emotionally Abused: No    Physically Abused: No    Sexually Abused: No    Family History  Problem Relation Age of Onset   Cancer Mother        ovarian   Kidney disease Father    Alcohol abuse Father    Cancer Maternal Aunt        lung- smoker   Heart disease Maternal Uncle     Current Outpatient Medications  Medication Sig Dispense Refill   acetaminophen (TYLENOL) 650 MG CR tablet Take 650-1,300 mg by mouth every 8 (eight) hours as needed for pain.     alendronate (FOSAMAX) 10 MG tablet TAKE (1) TABLET BY MOUTH PER WEEK TAKE ON EMPTY STOMACH WITH FULL GLASS OF WATER 4 tablet 10   allopurinol (ZYLOPRIM) 100 MG tablet TAKE 1 TABLET BY MOUTH EVERY DAY 90 tablet 1   apixaban (ELIQUIS) 5 MG TABS tablet Take 1 tablet (5 mg total) by mouth 2 (two) times daily. 180 tablet 1   azithromycin (ZITHROMAX) 250 MG tablet Take 1 tablet (250 mg total) by mouth daily. 4 tablet 0   calcium carbonate (TUMS - DOSED IN MG ELEMENTAL CALCIUM) 500 MG chewable tablet Chew 1-2 tablets by mouth at bedtime.     CALCIUM PO Take 1 tablet by mouth daily.     cephALEXin (KEFLEX) 500 MG capsule Take 1 capsule (500 mg total) by mouth 2 (two) times daily for 14 days. 28 capsule 0   cholecalciferol (VITAMIN D3) 25 MCG (1000 UT) tablet Take 1,000 Units by mouth daily.  clindamycin (CLINDAGEL) 1 % gel APPLY TO AFFECTED AREA TWICE A DAY 30 g 0   colchicine 0.6 MG tablet Take 1 tablet (0.6 mg total) by mouth 2 (two) times daily. Take twice daily until pain resolves and then take as needed for gout flare. 60 tablet 1   [facility] ANTI-FUNGAL 2 % powder APPLY TO AFFECTED AREA TWICE A DAY AFTER SKIN RESOLVES FROM KETOCONAZOLE CREAM 85 g 1   diazepam (VALIUM) 5 MG tablet TAKE 1/2 TABLET (2.5MG  TOTAL) BY MOUTH AT BEDTIME 30 tablet 0   dicyclomine (BENTYL) 10 MG capsule Take 10-20  mg by mouth 3 (three) times daily as needed for spasms.     Flax Oil-Fish Oil-Borage Oil (FISH-FLAX-BORAGE) CAPS Take 1 capsule by mouth daily.     fluticasone (FLONASE) 50 MCG/ACT nasal spray Place 1 spray into both nostrils daily. 15.8 mL 2   furosemide (LASIX) 20 MG tablet Take 1 tablet (20 mg total) by mouth daily. *MUST KEEP APPT FOR FURTHER REFILLS* 90 tablet 1   Garlic 100 MG TABS Take by mouth. DAILY     glucosamine-chondroitin 500-400 MG tablet Take 1 tablet by mouth daily.      HYDROcodone-acetaminophen (NORCO/VICODIN) 5-325 MG tablet Take 1 tablet by mouth every 6 (six) hours as needed for moderate pain. 10 tablet 0   lisinopril (ZESTRIL) 20 MG tablet Patient taking 1/2 tablet (10 mg total) by mouth daily     loratadine (CLARITIN) 10 MG tablet TAKE 1 TABLET BY MOUTH EVERY DAY 90 tablet 1   metoprolol succinate (TOPROL-XL) 100 MG 24 hr tablet Take 1.5 tablets (150 mg total) by mouth daily. Take with or immediately following a meal. 135 tablet 3   Multiple Vitamins-Minerals (EMERGEN-C IMMUNE) PACK Take 1 packet by mouth daily.     Multiple Vitamins-Minerals (MULTIVITAMIN WOMEN 50+) TABS Take 1 tablet by mouth daily.      mupirocin ointment (BACTROBAN) 2 % APPLY TOPICALLY TO AFFECTED AREA TWICE DAILY 22 g 10   Nutritional Supplements (ECHINACEA/GOLDEN SEAL PO) Take 1 tablet by mouth daily.      nystatin (MYCOSTATIN/NYSTOP) powder Apply 1 application topically 2 (two) times daily. 15 g 0   polycarbophil (FIBERCON) 625 MG tablet Take 625 mg by mouth daily.     potassium chloride (KLOR-CON) 20 MEQ packet USE 1 PACKET BY MOUTH EVERY DAY 90 packet 3   Probiotic Product (PROBIOTIC-10 PO) Take 1 tablet by mouth daily.      Propylene Glycol (SYSTANE COMPLETE) 0.6 % SOLN Place 1 drop into both eyes 4 (four) times daily as needed.     rosuvastatin (CRESTOR) 5 MG tablet Take 5 mg by mouth at bedtime.     Turmeric (QC TUMERIC COMPLEX PO) Take by mouth. WITH CONDIN DAILY     No current  facility-administered medications for this visit.    Allergies  Allergen Reactions   Dome-Paste Bandage [Wound Dressings] Other (See Comments)    bleeding   Flecainide Anaphylaxis and Other (See Comments)    Required hospitalization   Flecainide Acetate Anaphylaxis, Shortness Of Breath and Swelling     (hospitalized)   Ciprofloxacin Other (See Comments)    Tingling in fingers     REVIEW OF SYSTEMS:   [X]  denotes positive finding, [ ]  denotes negative finding Cardiac  Comments:  Chest pain or chest pressure:    Shortness of breath upon exertion:    Short of breath when lying flat:    Irregular heart rhythm:  Vascular    Pain in calf, thigh, or hip brought on by ambulation:    Pain in feet at night that wakes you up from your sleep:     Blood clot in your veins:    Leg swelling:  X       Pulmonary    Oxygen at home:    Productive cough:     Wheezing:         Neurologic    Sudden weakness in arms or legs:     Sudden numbness in arms or legs:     Sudden onset of difficulty speaking or slurred speech:    Temporary loss of vision in one eye:     Problems with dizziness:         Gastrointestinal    Blood in stool:     Vomited blood:         Genitourinary    Burning when urinating:     Blood in urine:        Psychiatric    Major depression:         Hematologic    Bleeding problems:    Problems with blood clotting too easily:        Skin    Rashes or ulcers:        Constitutional    Fever or chills:      PHYSICAL EXAMINATION:  General:  WDWN in NAD; vital signs documented above Gait: Ambulates with cane HENT: WNL, normocephalic Pulmonary: normal non-labored breathing , without  wheezing Cardiac: regular HR Vascular Exam/Pulses: Palpable popliteal pulse, distal pulses not palpable due to edema. Wounds are superficial. Not able to express any drainage. There is some mild erythema around the distal anterior wounds. Wounds were cleaned, bacitracin was  applied followed by 2x2s, and ACE bandage wraps   Neurologic: A&O X 3   ASSESSMENT/PLAN:: 82 y.o. female here for follow up for lymphedema with ulcerations of BLE. Her left leg is essentially healed once dressings were removed. Her right leg continues to have some wounds but she feels aside from the unna boot helping with compression that her wounds are worse. I actually feel they are better but maybe a little more surrounding erythema. I think she really just needs compression. I elected to d/c the unna boot and she will transition to ACE wraps of her legs. Her legs were cleaned today and wrapped in ACE bandages bilaterally. Instructed her to changes these daily or every other day at most.  She expressed that she could do this on her own. Advised her to clean her legs and she can apply triple antibiotic ointment or small piece of Xeroform to the areas with ulceration - Continue to elevate - complete Antibiotic course as prescribed - She can start to try and transition back to her compression stockings as she is able - Still working on getting her lymphedema pumps. Referral for this is already in process - she will follow up in 2-3 weeks for wound check   Graceann Congress, PA-C [facility] [phone]  Clinic MD:   Myra Gianotti

## 2023-04-25 ENCOUNTER — Encounter: Payer: Self-pay | Admitting: Physician Assistant

## 2023-05-08 ENCOUNTER — Ambulatory Visit (INDEPENDENT_AMBULATORY_CARE_PROVIDER_SITE_OTHER): Payer: Medicare Other | Admitting: Physician Assistant

## 2023-05-08 VITALS — BP 129/84 | HR 55 | Temp 97.8°F | Resp 16 | Ht 64.0 in | Wt 207.0 lb

## 2023-05-08 DIAGNOSIS — I872 Venous insufficiency (chronic) (peripheral): Secondary | ICD-10-CM

## 2023-05-08 NOTE — Progress Notes (Signed)
Office Note  History of Present Illness   Kristen [name] is a 82 y.o. ([birth year]) female who presents for repeat wound check. She has a history of chronic lower extremity swelling due to venous insufficiency and lymphedema. She has a history of lower extremity ulcerations requiring Unna boot dressings for healing.   Recently she had some small venous ulcerations of her bilateral shins that were being treated with compression and elevation. She previously was in an [place] boot but this was discontinued due to patient preference. At follow up today she is doing well. Her legs are still very swollen, however her ulcerations have finally healed. She is elevating her legs multiple times a day but not above the level of her heart. She states she does not like to lay flat to prop her feet up because she gets bored looking at the ceiling. She has worn ace wraps for compression. Her lymphedema pumps finally arrived to her house today.   Past Medical History:  Diagnosis Date   Abscess of abdominal cavity (HCC) 10/02/2019   Anticoagulant long-term use    elquis -- managed by cardiology   Anxiety    SITUATIONAL IN PAST   Atrial fibrillation (HCC)    Chronic calculous cholecystitis    Chronic kidney disease    ckd stage 4 per lov dr [name] 09-28-2020 on chart   Chronic venous insufficiency    w/  varicose veins right worse than left waers compresiion stocking prn   Depression    SITUATIONAL IN PAST   Diastolic CHF, chronic (HCC) 2010   followed by cardiology HX OF 2010 DUE TO FLECANIDE   Diverticulosis of colon    Dyspnea    occ with heavt activity and at bedtime when tired for last few months per pt   Dysrhythmia    a-fib   Edema of both lower extremities    per pt mostly in summer time   Essential hypertension, benign    Fibrocystic breast disease    Full dentures    GERD (gastroesophageal reflux disease)    occasional tums and does not eat prior to bedtime    Gout    08-19-2019  per pt last episode DEC 2021 LEFT HAND   Hiatal hernia    History of diverticulitis 01/22/2016   History of recurrent UTIs    NONE RECNET SEES DR WRENN   Hx of cholecystectomy 10/02/2019   Hyperlipidemia    Insomnia    Migraines    OA (osteoarthritis)    knees, lower back   Permanent atrial fibrillation [facility]) cardiologist--- [name]   first dx 10/ 2011--- histroy DCCV 11-25-2010 by [name] (pt's previous cardiologist) and Cardiac cath 12-17-2010 no significant disease   Pneumonia 08/2019   COLLAPSED LEFT LUNG AND PNEUMONIA   Scoliosis    Stress incontinence in female     Past Surgical History:  Procedure Laterality Date   BILIARY STENT PLACEMENT N/A 09/03/2019   Procedure: BILIARY STENT PLACEMENT;  Surgeon: [name], MD;  Location: [facility];  Service: Endoscopy;  Laterality: N/A;   BLEPHAROPLASTY Bilateral 02-22-2010  [name]   upper eyelid's   both knees cortisone injection  08/18/2020   [name]   BUNIONECTOMY Right 2003   CATARACT EXTRACTION W/ INTRAOCULAR LENS  IMPLANT, BILATERAL  2018   CERVICAL CONIZATION W/BX N/A 11/05/2020   Procedure: CONIZATION CERVIX WITH BIOPSY;  Surgeon: [name], MD;  Location: [facility];  Service: Gynecology;  Laterality: N/A;   CHOLECYSTECTOMY N/A 08/22/2019   Procedure: LAPAROSCOPIC CHOLECYSTECTOMY;  Surgeon: [name], [name], MD;  [facility];  Service: General;  Laterality: N/A;   ERCP N/A 09/03/2019   Procedure: ENDOSCOPIC RETROGRADE CHOLANGIOPANCREATOGRAPHY (ERCP);  Surgeon: [name], MD;  Location: [facility];  Service: Endoscopy;  Laterality: N/A;   ESOPHAGOGASTRODUODENOSCOPY (EGD) WITH PROPOFOL N/A 12/06/2019   Procedure: ESOPHAGOGASTRODUODENOSCOPY (EGD) WITH PROPOFOL;  Surgeon: [name], MD;  Location: [facility];  Service: Endoscopy;  Laterality: N/A;  stent removal, ERCP Scope, needs Flouro   HYSTEROSCOPY WITH D & C N/A 11/05/2020    Procedure: DILATATION AND CURETTAGE /HYSTEROSCOPY WITH MYOSURE;  Surgeon: [name], MD;  Location: [facility];  Service: Gynecology;  Laterality: N/A;   IR SINUS/FIST TUBE CHK-NON GI  09/10/2019   IR SINUS/FIST TUBE CHK-NON GI  09/24/2019   KNEE ARTHROSCOPY Left 2002   meniscus repair   REMOVAL OF STONES  09/03/2019   Procedure: REMOVAL OF STONES;  Surgeon: [name], MD;  Location: [facility];  Service: Endoscopy;;   SPHINCTEROTOMY  09/03/2019   Procedure: Dennison Mascot;  Surgeon: [name], MD;  Location: [facility];  Service: Endoscopy;;   STENT REMOVAL  12/06/2019   Procedure: STENT REMOVAL;  Surgeon: [name], MD;  Location: [facility];  Service: Endoscopy;;   TUBAL LIGATION Bilateral 1980   AND RIGHT BREAST EXCISION CYST (BENIGN)    Social History   Socioeconomic History   Marital status: Widowed    Spouse name: Dimas Aguas   Number of children: 1   Years of education: Masters   Highest education level: Master's degree (e.g., MA, MS, MEng, MEd, MSW, MBA)  Occupational History   Occupation: Retired- Magazine features editor: RETIRED  Tobacco Use   Smoking status: Never   Smokeless tobacco: Never  Vaping Use   Vaping Use: Never used  Substance and Sexual Activity   Alcohol use: Not Currently   Drug use: Never   Sexual activity: Not Currently    Birth control/protection: Post-menopausal  Other Topics Concern   Not on file  Social History Narrative   Emergency Contact: Elias Else (son) [phone]   Tristan Schroeder (friend) cell: [phone] work: [phone] *first POA   Izora Gala (friend) cell: [phone] home: [phone] * second POA   Who lives with you: self   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 Exercise: Pt dances regularly for shows   Seatbelts: Pt reports wearing seatbelt when in vehicles.    Sun Exposure/Protection: Pt reports not being in the sun  very much   Hobbies: dancing, painting,    Working smoke alarm: yes   Home throw rugs: yes   Home free from clutter: yes   ______________________________________________________________________________________   Current Social History     Who lives at home: lives alone, retired Engineer, site and librarian  10/25/2019    Transportation: provided by [facility] Medicare and her female friend 10/25/2019   Important Relationships & Pets: has cats and a friend that she talks to or sees daily.  Reports no family local 10/25/2019    Current Stressors: clutter in her home 10/25/2019   Other: Unable to prepare meals and needs help with keeping her home clean.  Describes house as being cluttered with narrow paths in her trailer.  No cental heat uses space heaters  for the past year. Leaking kitchen causes water bill to be higher than it should be 10/25/2019   Sammuel Hines, LCSW   Clinical Social Worker                                                     Caffeine: 1-2 cup of coffee a day      Right handed       Lives alone w 4 cats                                                   Social Determinants of Health   Financial Resource Strain: Low Risk  (10/11/2018)   Overall Financial Resource Strain (CARDIA)    Difficulty of Paying Living Expenses: Not hard at all  Food Insecurity: Food Insecurity Present (10/25/2019)   Hunger Vital Sign    Worried About Running Out of Food in the Last Year: Sometimes true    Ran Out of Food in the Last Year: Sometimes true  Transportation Needs: No Transportation Needs (10/25/2019)   PRAPARE - Administrator, Civil Service (Medical): No    Lack of Transportation (Non-Medical): No  Physical Activity: Inactive (10/11/2018)   Exercise Vital Sign    Days of Exercise per Week: 0 days    Minutes of Exercise per Session: 0 min  Stress: No Stress Concern Present (10/11/2018)   [employer] of Occupational Health - Occupational Stress Questionnaire    Feeling  of Stress : Not at all  Social Connections: Somewhat Isolated (10/11/2018)   Social Connection and Isolation Panel [NHANES]    Frequency of Communication with Friends and Family: Three times a week    Frequency of Social Gatherings with Friends and Family: Never    Attends Religious Services: Never    Database administrator or Organizations: Yes    Attends Banker Meetings: 1 to 4 times per year    Marital Status: Widowed  Intimate Partner Violence: Not At Risk (10/11/2018)   Humiliation, Afraid, Rape, and Kick questionnaire    Fear of Current or Ex-Partner: No    Emotionally Abused: No    Physically Abused: No    Sexually Abused: No    Family History  Problem Relation Age of Onset   Cancer Mother        ovarian   Kidney disease Father    Alcohol abuse Father    Cancer Maternal Aunt        lung- smoker   Heart disease Maternal Uncle     Current Outpatient Medications  Medication Sig Dispense Refill   acetaminophen (TYLENOL) 650 MG CR tablet Take 650-1,300 mg by mouth every 8 (eight) hours as needed for pain.     alendronate (FOSAMAX) 10 MG tablet TAKE (1) TABLET BY MOUTH PER WEEK TAKE ON EMPTY STOMACH WITH FULL GLASS OF WATER 4 tablet 10   allopurinol (ZYLOPRIM) 100 MG tablet TAKE 1 TABLET BY MOUTH EVERY DAY 90 tablet 1   apixaban (ELIQUIS) 5 MG TABS tablet Take 1 tablet (5 mg total) by mouth 2 (two) times daily. 180 tablet 1   calcium carbonate (TUMS - DOSED IN MG ELEMENTAL CALCIUM)  500 MG chewable tablet Chew 1-2 tablets by mouth at bedtime.     CALCIUM PO Take 1 tablet by mouth daily.     cholecalciferol (VITAMIN D3) 25 MCG (1000 UT) tablet Take 1,000 Units by mouth daily.     clindamycin (CLINDAGEL) 1 % gel APPLY TO AFFECTED AREA TWICE A DAY 30 g 0   colchicine 0.6 MG tablet Take 1 tablet (0.6 mg total) by mouth 2 (two) times daily. Take twice daily until pain resolves and then take as needed for gout flare. 60 tablet 1   [facility] ANTI-FUNGAL 2 % powder APPLY TO  AFFECTED AREA TWICE A DAY AFTER SKIN RESOLVES FROM KETOCONAZOLE CREAM 85 g 1   diazepam (VALIUM) 5 MG tablet TAKE 1/2 TABLET (2.5MG  TOTAL) BY MOUTH AT BEDTIME 30 tablet 0   dicyclomine (BENTYL) 10 MG capsule Take 10-20 mg by mouth 3 (three) times daily as needed for spasms.     Flax Oil-Fish Oil-Borage Oil (FISH-FLAX-BORAGE) CAPS Take 1 capsule by mouth daily.     fluticasone (FLONASE) 50 MCG/ACT nasal spray Place 1 spray into both nostrils daily. 15.8 mL 2   furosemide (LASIX) 20 MG tablet Take 1 tablet (20 mg total) by mouth daily. *MUST KEEP APPT FOR FURTHER REFILLS* 90 tablet 1   Garlic 100 MG TABS Take by mouth. DAILY     glucosamine-chondroitin 500-400 MG tablet Take 1 tablet by mouth daily.      HYDROcodone-acetaminophen (NORCO/VICODIN) 5-325 MG tablet Take 1 tablet by mouth every 6 (six) hours as needed for moderate pain. 10 tablet 0   lisinopril (ZESTRIL) 20 MG tablet Patient taking 1/2 tablet (10 mg total) by mouth daily     loratadine (CLARITIN) 10 MG tablet TAKE 1 TABLET BY MOUTH EVERY DAY 90 tablet 1   metoprolol succinate (TOPROL-XL) 100 MG 24 hr tablet Take 1.5 tablets (150 mg total) by mouth daily. Take with or immediately following a meal. 135 tablet 3   Multiple Vitamins-Minerals (EMERGEN-C IMMUNE) PACK Take 1 packet by mouth daily.     Multiple Vitamins-Minerals (MULTIVITAMIN WOMEN 50+) TABS Take 1 tablet by mouth daily.      mupirocin ointment (BACTROBAN) 2 % APPLY TOPICALLY TO AFFECTED AREA TWICE DAILY 22 g 10   Nutritional Supplements (ECHINACEA/GOLDEN SEAL PO) Take 1 tablet by mouth daily.      nystatin (MYCOSTATIN/NYSTOP) powder Apply 1 application topically 2 (two) times daily. 15 g 0   polycarbophil (FIBERCON) 625 MG tablet Take 625 mg by mouth daily.     potassium chloride (KLOR-CON) 20 MEQ packet USE 1 PACKET BY MOUTH EVERY DAY 90 packet 3   Probiotic Product (PROBIOTIC-10 PO) Take 1 tablet by mouth daily.      Propylene Glycol (SYSTANE COMPLETE) 0.6 % SOLN Place 1  drop into both eyes 4 (four) times daily as needed.     rosuvastatin (CRESTOR) 5 MG tablet Take 5 mg by mouth at bedtime.     Turmeric (QC TUMERIC COMPLEX PO) Take by mouth. WITH CONDIN DAILY     azithromycin (ZITHROMAX) 250 MG tablet Take 1 tablet (250 mg total) by mouth daily. (Patient not taking: Reported on 05/08/2023) 4 tablet 0   No current facility-administered medications for this visit.    Allergies  Allergen Reactions   Dome-Paste Bandage [Wound Dressings] Other (See Comments)    bleeding   Flecainide Anaphylaxis and Other (See Comments)    Required hospitalization   Flecainide Acetate Anaphylaxis, Shortness Of Breath and Swelling     (  hospitalized)   Ciprofloxacin Other (See Comments)    Tingling in fingers    REVIEW OF SYSTEMS (negative unless checked):   Cardiac:  []  Chest pain or chest pressure? []  Shortness of breath upon activity? []  Shortness of breath when lying flat? []  Irregular heart rhythm?  Vascular:  []  Pain in calf, thigh, or hip brought on by walking? []  Pain in feet at night that wakes you up from your sleep? []  Blood clot in your veins? [x]  Leg swelling?  Pulmonary:  []  Oxygen at home? []  Productive cough? []  Wheezing?  Neurologic:  []  Sudden weakness in arms or legs? []  Sudden numbness in arms or legs? []  Sudden onset of difficult speaking or slurred speech? []  Temporary loss of vision in one eye? []  Problems with dizziness?  Gastrointestinal:  []  Blood in stool? []  Vomited blood?  Genitourinary:  []  Burning when urinating? []  Blood in urine?  Psychiatric:  []  Major depression  Hematologic:  []  Bleeding problems? []  Problems with blood clotting?  Dermatologic:  []  Rashes or ulcers?  Constitutional:  []  Fever or chills?  Ear/Nose/Throat:  []  Change in hearing? []  Nose bleeds? []  Sore throat?  Musculoskeletal:  []  Back pain? []  Joint pain? []  Muscle pain?   Physical Examination     Vitals:   05/08/23 1517  BP:  129/84  Pulse: (!) 55  Resp: 16  Temp: 97.8 F (36.6 C)  TempSrc: Temporal  SpO2: 95%  Weight: 207 lb (93.9 kg)  Height: 5\' 4"  (1.626 m)   Body mass index is 35.53 kg/m.  General:  WDWN in NAD; vital signs documented above Gait: Not observed HENT: WNL, normocephalic Pulmonary: normal non-labored breathing , without Rales, rhonchi,  wheezing Cardiac: regular Abdomen: soft, NT, no masses Skin: without rashes Vascular Exam/Pulses: BLE warm and well perfused Extremities: bilateral lower extremities with 3+ edema from tibia to dorsum of foot. Ulcerations healed Musculoskeletal: no muscle wasting or atrophy  Neurologic: A&O X 3;  No focal weakness or paresthesias are detected Psychiatric:  The pt has Normal affect.   Medical Decision Making   Kristen [name] is a 82 y.o. female who presents for repeat wound check  The patient has a long history of lymphedema and has been prescribed pumps. Thankfully these have arrived at her house today. She also has a history of lower extremity ulcerations that have previously required unna boot changes. On exam today her ulcerations are fully healed. She has no evidence of infection or new ulcerations Her legs are very edematous on exam. I encouraged her to continue elevation and compression. She will also use her lymphedema pumps daily now She can follow up with our office as needed   Ernestene Mention, PA-C [facility] Office: [phone]  05/08/2023, 9:01 AM  Clinic MD: Myra Gianotti

## 2023-06-02 ENCOUNTER — Emergency Department (HOSPITAL_COMMUNITY): Payer: Medicare Other

## 2023-06-02 ENCOUNTER — Other Ambulatory Visit: Payer: Self-pay

## 2023-06-02 ENCOUNTER — Inpatient Hospital Stay (HOSPITAL_COMMUNITY)
Admission: EM | Admit: 2023-06-02 | Discharge: 2023-06-05 | DRG: 291 | Disposition: A | Discharge status: Home / Self Care | Payer: Medicare Other | Attending: Internal Medicine | Admitting: Internal Medicine

## 2023-06-02 ENCOUNTER — Other Ambulatory Visit (HOSPITAL_COMMUNITY): Payer: Medicare Other

## 2023-06-02 DIAGNOSIS — E785 Hyperlipidemia, unspecified: Secondary | ICD-10-CM | POA: Diagnosis present

## 2023-06-02 DIAGNOSIS — I509 Heart failure, unspecified: Secondary | ICD-10-CM

## 2023-06-02 DIAGNOSIS — Z888 Allergy status to other drugs, medicaments and biological substances status: Secondary | ICD-10-CM

## 2023-06-02 DIAGNOSIS — I2510 Atherosclerotic heart disease of native coronary artery without angina pectoris: Secondary | ICD-10-CM | POA: Diagnosis present

## 2023-06-02 DIAGNOSIS — I4821 Permanent atrial fibrillation: Secondary | ICD-10-CM | POA: Diagnosis present

## 2023-06-02 DIAGNOSIS — Z7901 Long term (current) use of anticoagulants: Secondary | ICD-10-CM | POA: Diagnosis not present

## 2023-06-02 DIAGNOSIS — Z9851 Tubal ligation status: Secondary | ICD-10-CM

## 2023-06-02 DIAGNOSIS — Z7983 Long term (current) use of bisphosphonates: Secondary | ICD-10-CM

## 2023-06-02 DIAGNOSIS — E876 Hypokalemia: Secondary | ICD-10-CM | POA: Diagnosis present

## 2023-06-02 DIAGNOSIS — Z8249 Family history of ischemic heart disease and other diseases of the circulatory system: Secondary | ICD-10-CM | POA: Diagnosis not present

## 2023-06-02 DIAGNOSIS — R0602 Shortness of breath: Secondary | ICD-10-CM | POA: Diagnosis present

## 2023-06-02 DIAGNOSIS — Z9842 Cataract extraction status, left eye: Secondary | ICD-10-CM

## 2023-06-02 DIAGNOSIS — I5031 Acute diastolic (congestive) heart failure: Secondary | ICD-10-CM | POA: Diagnosis not present

## 2023-06-02 DIAGNOSIS — E871 Hypo-osmolality and hyponatremia: Secondary | ICD-10-CM | POA: Diagnosis present

## 2023-06-02 DIAGNOSIS — F419 Anxiety disorder, unspecified: Secondary | ICD-10-CM | POA: Diagnosis present

## 2023-06-02 DIAGNOSIS — I890 Lymphedema, not elsewhere classified: Secondary | ICD-10-CM | POA: Diagnosis present

## 2023-06-02 DIAGNOSIS — Z9841 Cataract extraction status, right eye: Secondary | ICD-10-CM

## 2023-06-02 DIAGNOSIS — Z79899 Other long term (current) drug therapy: Secondary | ICD-10-CM | POA: Diagnosis not present

## 2023-06-02 DIAGNOSIS — Z6841 Body Mass Index (BMI) 40.0 and over, adult: Secondary | ICD-10-CM | POA: Diagnosis not present

## 2023-06-02 DIAGNOSIS — Z1152 Encounter for screening for COVID-19: Secondary | ICD-10-CM | POA: Diagnosis not present

## 2023-06-02 DIAGNOSIS — L309 Dermatitis, unspecified: Secondary | ICD-10-CM | POA: Diagnosis present

## 2023-06-02 DIAGNOSIS — M109 Gout, unspecified: Secondary | ICD-10-CM | POA: Diagnosis present

## 2023-06-02 DIAGNOSIS — K219 Gastro-esophageal reflux disease without esophagitis: Secondary | ICD-10-CM | POA: Diagnosis present

## 2023-06-02 DIAGNOSIS — N179 Acute kidney failure, unspecified: Secondary | ICD-10-CM | POA: Diagnosis present

## 2023-06-02 DIAGNOSIS — I130 Hypertensive heart and chronic kidney disease with heart failure and stage 1 through stage 4 chronic kidney disease, or unspecified chronic kidney disease: Principal | ICD-10-CM | POA: Diagnosis present

## 2023-06-02 DIAGNOSIS — E8809 Other disorders of plasma-protein metabolism, not elsewhere classified: Secondary | ICD-10-CM | POA: Diagnosis present

## 2023-06-02 DIAGNOSIS — Z881 Allergy status to other antibiotic agents status: Secondary | ICD-10-CM

## 2023-06-02 DIAGNOSIS — Z91048 Other nonmedicinal substance allergy status: Secondary | ICD-10-CM

## 2023-06-02 DIAGNOSIS — R197 Diarrhea, unspecified: Secondary | ICD-10-CM | POA: Diagnosis present

## 2023-06-02 DIAGNOSIS — I4891 Unspecified atrial fibrillation: Secondary | ICD-10-CM | POA: Diagnosis present

## 2023-06-02 DIAGNOSIS — Z841 Family history of disorders of kidney and ureter: Secondary | ICD-10-CM

## 2023-06-02 DIAGNOSIS — N184 Chronic kidney disease, stage 4 (severe): Secondary | ICD-10-CM | POA: Diagnosis present

## 2023-06-02 DIAGNOSIS — M419 Scoliosis, unspecified: Secondary | ICD-10-CM | POA: Diagnosis present

## 2023-06-02 DIAGNOSIS — I5033 Acute on chronic diastolic (congestive) heart failure: Secondary | ICD-10-CM | POA: Diagnosis present

## 2023-06-02 DIAGNOSIS — D72829 Elevated white blood cell count, unspecified: Secondary | ICD-10-CM | POA: Diagnosis not present

## 2023-06-02 DIAGNOSIS — Z9049 Acquired absence of other specified parts of digestive tract: Secondary | ICD-10-CM

## 2023-06-02 DIAGNOSIS — Z961 Presence of intraocular lens: Secondary | ICD-10-CM | POA: Diagnosis present

## 2023-06-02 DIAGNOSIS — Z87440 Personal history of urinary (tract) infections: Secondary | ICD-10-CM

## 2023-06-02 DIAGNOSIS — R609 Edema, unspecified: Secondary | ICD-10-CM | POA: Diagnosis not present

## 2023-06-02 LAB — URINALYSIS, ROUTINE W REFLEX MICROSCOPIC
Bilirubin Urine: NEGATIVE
Glucose, UA: NEGATIVE mg/dL
Hgb urine dipstick: NEGATIVE
Ketones, ur: NEGATIVE mg/dL
Leukocytes,Ua: NEGATIVE
Nitrite: NEGATIVE
Protein, ur: NEGATIVE mg/dL
Specific Gravity, Urine: 1.005 (ref 1.005–1.030)
pH: 8 (ref 5.0–8.0)

## 2023-06-02 LAB — MAGNESIUM: Magnesium: 2.2 mg/dL (ref 1.7–2.4)

## 2023-06-02 LAB — BASIC METABOLIC PANEL
Anion gap: 12 (ref 5–15)
BUN: 23 mg/dL (ref 8–23)
CO2: 25 mmol/L (ref 22–32)
Calcium: 9.1 mg/dL (ref 8.9–10.3)
Chloride: 100 mmol/L (ref 98–111)
Creatinine, Ser: 0.97 mg/dL (ref 0.44–1.00)
GFR, Estimated: 58 mL/min — ABNORMAL LOW (ref >60)
Glucose, Bld: 110 mg/dL — ABNORMAL HIGH (ref 70–99)
Potassium: 3.4 mmol/L — ABNORMAL LOW (ref 3.5–5.1)
Sodium: 137 mmol/L (ref 135–145)

## 2023-06-02 LAB — CBC
HCT: 40.5 % (ref 36.0–46.0)
Hemoglobin: 13.1 g/dL (ref 12.0–15.0)
MCH: 30.6 pg (ref 26.0–34.0)
MCHC: 32.3 g/dL (ref 30.0–36.0)
MCV: 94.6 fL (ref 80.0–100.0)
Platelets: 276 10*3/uL (ref 150–400)
RBC: 4.28 MIL/uL (ref 3.87–5.11)
RDW: 14.5 % (ref 11.5–15.5)
WBC: 15.1 10*3/uL — ABNORMAL HIGH (ref 4.0–10.5)
nRBC: 0 % (ref 0.0–0.2)

## 2023-06-02 LAB — PHOSPHORUS: Phosphorus: 3.5 mg/dL (ref 2.5–4.6)

## 2023-06-02 LAB — RESP PANEL BY RT-PCR (RSV, FLU A&B, COVID)  RVPGX2
Influenza A by PCR: NEGATIVE
Influenza B by PCR: NEGATIVE
Resp Syncytial Virus by PCR: NEGATIVE
SARS Coronavirus 2 by RT PCR: NEGATIVE

## 2023-06-02 LAB — TROPONIN I (HIGH SENSITIVITY)
Troponin I (High Sensitivity): 19 ng/L — ABNORMAL HIGH (ref <18)
Troponin I (High Sensitivity): 26 ng/L — ABNORMAL HIGH (ref <18)

## 2023-06-02 LAB — TSH: TSH: 3.956 u[IU]/mL (ref 0.350–4.500)

## 2023-06-02 LAB — BRAIN NATRIURETIC PEPTIDE: B Natriuretic Peptide: 391.8 pg/mL — ABNORMAL HIGH (ref 0.0–100.0)

## 2023-06-02 MED ORDER — ONDANSETRON HCL 4 MG/2ML IJ SOLN: 4.0000 mg | Freq: Four times a day (QID) | INTRAMUSCULAR | Status: DC | PRN

## 2023-06-02 MED ORDER — SODIUM CHLORIDE 0.9% FLUSH
3.0000 mL | Freq: Two times a day (BID) | INTRAVENOUS | Status: DC
  Administered 2023-06-02 – 2023-06-05 (×6): 3 mL via INTRAVENOUS

## 2023-06-02 MED ORDER — POTASSIUM CHLORIDE CRYS ER 20 MEQ PO TBCR
40.0000 meq | EXTENDED_RELEASE_TABLET | ORAL | Status: AC
  Administered 2023-06-02: 40 meq via ORAL
  Filled 2023-06-02: qty 2

## 2023-06-02 MED ORDER — SODIUM CHLORIDE 0.9 % IV SOLN: 250.0000 mL | INTRAVENOUS | Status: DC | PRN

## 2023-06-02 MED ORDER — FLUTICASONE PROPIONATE 50 MCG/ACT NA SUSP
1.0000 | Freq: Every day | NASAL | Status: DC
  Administered 2023-06-03 – 2023-06-05 (×3): 1 via NASAL
  Filled 2023-06-02: qty 16

## 2023-06-02 MED ORDER — HYDROCODONE-ACETAMINOPHEN 5-325 MG PO TABS: 1.0000 | ORAL_TABLET | Freq: Four times a day (QID) | ORAL | Status: DC | PRN

## 2023-06-02 MED ORDER — SODIUM CHLORIDE 0.9% FLUSH: 3.0000 mL | INTRAVENOUS | Status: DC | PRN

## 2023-06-02 MED ORDER — DICYCLOMINE HCL 10 MG PO CAPS: 10.0000 mg | ORAL_CAPSULE | Freq: Three times a day (TID) | ORAL | Status: DC | PRN

## 2023-06-02 MED ORDER — DIGOXIN 0.25 MG/ML IJ SOLN
0.1250 mg | Freq: Four times a day (QID) | INTRAMUSCULAR | Status: AC
  Administered 2023-06-02 – 2023-06-03 (×2): 0.125 mg via INTRAVENOUS
  Filled 2023-06-02 (×2): qty 2

## 2023-06-02 MED ORDER — APIXABAN 5 MG PO TABS
5.0000 mg | ORAL_TABLET | Freq: Two times a day (BID) | ORAL | Status: DC
  Administered 2023-06-02 – 2023-06-05 (×6): 5 mg via ORAL
  Filled 2023-06-02 (×6): qty 1

## 2023-06-02 MED ORDER — LISINOPRIL 10 MG PO TABS: 10.0000 mg | ORAL_TABLET | Freq: Every day | ORAL | Status: DC

## 2023-06-02 MED ORDER — DIAZEPAM 2 MG PO TABS
2.0000 mg | ORAL_TABLET | Freq: Every evening | ORAL | Status: DC | PRN
  Administered 2023-06-03 – 2023-06-04 (×2): 2 mg via ORAL
  Filled 2023-06-02 (×2): qty 1

## 2023-06-02 MED ORDER — METOPROLOL TARTRATE 50 MG PO TABS
150.0000 mg | ORAL_TABLET | Freq: Two times a day (BID) | ORAL | Status: DC
  Administered 2023-06-02 – 2023-06-05 (×6): 150 mg via ORAL
  Filled 2023-06-02 (×6): qty 1

## 2023-06-02 MED ORDER — CALCIUM POLYCARBOPHIL 625 MG PO TABS
625.0000 mg | ORAL_TABLET | Freq: Every day | ORAL | Status: DC
  Administered 2023-06-03 – 2023-06-05 (×3): 625 mg via ORAL
  Filled 2023-06-02 (×5): qty 1

## 2023-06-02 MED ORDER — POTASSIUM CHLORIDE CRYS ER 20 MEQ PO TBCR
40.0000 meq | EXTENDED_RELEASE_TABLET | ORAL | Status: AC
  Administered 2023-06-02 (×2): 40 meq via ORAL
  Filled 2023-06-02 (×2): qty 2

## 2023-06-02 MED ORDER — FUROSEMIDE 10 MG/ML IJ SOLN
60.0000 mg | Freq: Once | INTRAMUSCULAR | Status: AC
  Administered 2023-06-02: 60 mg via INTRAVENOUS
  Filled 2023-06-02: qty 6

## 2023-06-02 MED ORDER — FUROSEMIDE 10 MG/ML IJ SOLN
40.0000 mg | Freq: Two times a day (BID) | INTRAMUSCULAR | Status: DC
  Administered 2023-06-02 – 2023-06-04 (×4): 40 mg via INTRAVENOUS
  Filled 2023-06-02 (×4): qty 4

## 2023-06-02 MED ORDER — CALCIUM CARBONATE ANTACID 500 MG PO CHEW
1.0000 | CHEWABLE_TABLET | Freq: Every day | ORAL | Status: DC
  Administered 2023-06-02 – 2023-06-04 (×3): 200 mg via ORAL
  Filled 2023-06-02 (×3): qty 1

## 2023-06-02 MED ORDER — RISAQUAD PO CAPS
1.0000 | ORAL_CAPSULE | Freq: Every day | ORAL | Status: DC
  Administered 2023-06-03 – 2023-06-05 (×3): 1 via ORAL
  Filled 2023-06-02 (×3): qty 1

## 2023-06-02 MED ORDER — ROSUVASTATIN CALCIUM 5 MG PO TABS
5.0000 mg | ORAL_TABLET | Freq: Every day | ORAL | Status: DC
  Administered 2023-06-02 – 2023-06-04 (×3): 5 mg via ORAL
  Filled 2023-06-02 (×3): qty 1

## 2023-06-02 MED ORDER — ACETAMINOPHEN 325 MG PO TABS
650.0000 mg | ORAL_TABLET | ORAL | Status: DC | PRN
  Administered 2023-06-04 – 2023-06-05 (×4): 650 mg via ORAL
  Filled 2023-06-02 (×6): qty 2

## 2023-06-02 MED ORDER — LISINOPRIL 20 MG PO TABS
20.0000 mg | ORAL_TABLET | Freq: Every day | ORAL | Status: DC
  Administered 2023-06-02 – 2023-06-05 (×4): 20 mg via ORAL
  Filled 2023-06-02 (×4): qty 1

## 2023-06-02 MED ORDER — ACETAMINOPHEN ER 650 MG PO TBCR: 650.0000 mg | EXTENDED_RELEASE_TABLET | Freq: Three times a day (TID) | ORAL | Status: DC | PRN

## 2023-06-02 MED ORDER — LORATADINE 10 MG PO TABS: 10.0000 mg | ORAL_TABLET | Freq: Every day | ORAL | Status: DC

## 2023-06-02 MED ORDER — ALLOPURINOL 100 MG PO TABS
100.0000 mg | ORAL_TABLET | Freq: Every day | ORAL | Status: DC
  Administered 2023-06-03 – 2023-06-05 (×3): 100 mg via ORAL
  Filled 2023-06-02 (×3): qty 1

## 2023-06-02 NOTE — ED Provider Notes (Signed)
[place] EMERGENCY DEPARTMENT AT [facility] Provider Note   CSN: [id number] Arrival date & time: 06/02/23  1203     History  Chief Complaint  Patient presents with   Shortness of Breath   Diarrhea    Kristen [name] is a 82 y.o. female.  Patient is a 83 year old female with a history of atrial fibrillation on Eliquis, hypertension, CHF, lymphedema who presents with shortness of breath.  She states she has had worsening shortness of breath over the last 3 days.  She has had some associated orthopnea and increased leg swelling although she has not been using her leg stockings.  She denies any fevers.  She does not have any productive cough.  No associated chest pain or tightness other than a slight pain last night..         Home Medications Prior to Admission medications   Medication Sig Start Date End Date Taking? Authorizing Provider  acetaminophen (TYLENOL) 650 MG CR tablet Take 650-1,300 mg by mouth every 8 (eight) hours as needed for pain.    [provider]  alendronate (FOSAMAX) 10 MG tablet TAKE (1) TABLET BY MOUTH PER WEEK TAKE ON EMPTY STOMACH WITH FULL GLASS OF WATER 09/11/19   Myrene Buddy, MD  allopurinol (ZYLOPRIM) 100 MG tablet TAKE 1 TABLET BY MOUTH EVERY DAY 02/07/23   Adonis Huguenin, NP  apixaban (ELIQUIS) 5 MG TABS tablet Take 1 tablet (5 mg total) by mouth 2 (two) times daily. 02/17/23   Jake Bathe, MD  azithromycin (ZITHROMAX) 250 MG tablet Take 1 tablet (250 mg total) by mouth daily. Patient not taking: Reported on 05/08/2023 11/09/22   Geoffery Lyons, MD  calcium carbonate (TUMS - DOSED IN MG ELEMENTAL CALCIUM) 500 MG chewable tablet Chew 1-2 tablets by mouth at bedtime.    [provider]  CALCIUM PO Take 1 tablet by mouth daily.    [provider]  cholecalciferol (VITAMIN D3) 25 MCG (1000 UT) tablet Take 1,000 Units by mouth daily.    [provider]  clindamycin (CLINDAGEL) 1 % gel APPLY TO AFFECTED AREA  TWICE A DAY 04/13/18   Myrene Buddy, MD  colchicine 0.6 MG tablet Take 1 tablet (0.6 mg total) by mouth 2 (two) times daily. Take twice daily until pain resolves and then take as needed for gout flare. 06/15/18   [name], MD  [employer] ANTI-FUNGAL 2 % powder APPLY TO AFFECTED AREA TWICE A DAY AFTER SKIN RESOLVES FROM KETOCONAZOLE CREAM 10/17/14   Nani Ravens, MD  diazepam (VALIUM) 5 MG tablet TAKE 1/2 TABLET (2.5MG  TOTAL) BY MOUTH AT BEDTIME 12/17/20   Paige, Victoria J, DO  dicyclomine (BENTYL) 10 MG capsule Take 10-20 mg by mouth 3 (three) times daily as needed for spasms.    [provider]  Flax Oil-Fish Oil-Borage Oil (FISH-FLAX-BORAGE) CAPS Take 1 capsule by mouth daily.    [provider]  fluticasone (FLONASE) 50 MCG/ACT nasal spray Place 1 spray into both nostrils daily. 09/03/21   Jacalyn Lefevre, MD  furosemide (LASIX) 20 MG tablet Take 1 tablet (20 mg total) by mouth daily. *MUST KEEP APPT FOR FURTHER REFILLS* 12/01/22   Jake Bathe, MD  Garlic 100 MG TABS Take by mouth. DAILY    [provider]  glucosamine-chondroitin 500-400 MG tablet Take 1 tablet by mouth daily.     [provider]  HYDROcodone-acetaminophen (NORCO/VICODIN) 5-325 MG tablet Take 1 tablet by mouth every 6 (six) hours as  needed for moderate pain. 09/17/19   Sheikh, Kateri Mc Latif, DO  lisinopril (ZESTRIL) 20 MG tablet Patient taking 1/2 tablet (10 mg total) by mouth daily    [provider]  loratadine (CLARITIN) 10 MG tablet TAKE 1 TABLET BY MOUTH EVERY DAY 03/15/21   Idalia Needle, Lucas Mallow, DO  metoprolol succinate (TOPROL-XL) 100 MG 24 hr tablet Take 1.5 tablets (150 mg total) by mouth daily. Take with or immediately following a meal. 10/17/22   Jake Bathe, MD  Multiple Vitamins-Minerals (EMERGEN-C IMMUNE) PACK Take 1 packet by mouth daily.    [provider]  Multiple Vitamins-Minerals (MULTIVITAMIN WOMEN 50+) TABS Take 1 tablet by mouth daily.     [provider]  mupirocin ointment (BACTROBAN) 2 % APPLY TOPICALLY TO AFFECTED AREA TWICE DAILY 02/20/19   Myrene Buddy, MD  Nutritional Supplements (ECHINACEA/GOLDEN SEAL PO) Take 1 tablet by mouth daily.     [provider]  nystatin (MYCOSTATIN/NYSTOP) powder Apply 1 application topically 2 (two) times daily. 04/30/20   Doreene Eland, MD  polycarbophil (FIBERCON) 625 MG tablet Take 625 mg by mouth daily.    [provider]  potassium chloride (KLOR-CON) 20 MEQ packet USE 1 PACKET BY MOUTH EVERY DAY 08/11/20   Cora Collum, DO  Probiotic Product (PROBIOTIC-10 PO) Take 1 tablet by mouth daily.     [provider]  Propylene Glycol (SYSTANE COMPLETE) 0.6 % SOLN Place 1 drop into both eyes 4 (four) times daily as needed.    [provider]  rosuvastatin (CRESTOR) 5 MG tablet Take 5 mg by mouth at bedtime. 03/29/21   [provider]  Turmeric (QC TUMERIC COMPLEX PO) Take by mouth. WITH CONDIN DAILY    [provider]      Allergies    Dome-paste bandage [wound dressings], Flecainide, Flecainide acetate, and Ciprofloxacin    Review of Systems   Review of Systems  Constitutional:  Negative for chills, diaphoresis, fatigue and fever.  HENT:  Negative for congestion, rhinorrhea and sneezing.   Eyes: Negative.   Respiratory:  Positive for cough and shortness of breath. Negative for chest tightness.   Cardiovascular:  Positive for leg swelling. Negative for chest pain.  Gastrointestinal:  Negative for abdominal pain, blood in stool, diarrhea, nausea and vomiting.  Genitourinary:  Negative for difficulty urinating, flank pain, frequency and hematuria.  Musculoskeletal:  Negative for arthralgias and back pain.  Skin:  Negative for rash.  Neurological:  Negative for dizziness, speech difficulty, weakness, numbness and headaches.    Physical Exam Updated Vital Signs BP (!) 149/94   Pulse (!) 155   Temp 98.5 F (36.9 C) (Oral)   Resp  (!) 24   Ht 5' (1.524 m)   Wt 88.5 kg   SpO2 92%   BMI 38.08 kg/m  Physical Exam Constitutional:      Appearance: She is well-developed.  HENT:     Head: Normocephalic and atraumatic.  Eyes:     Pupils: Pupils are equal, round, and reactive to light.  Cardiovascular:     Rate and Rhythm: Normal rate and regular rhythm.     Heart sounds: Normal heart sounds.  Pulmonary:     Effort: Pulmonary effort is normal. Tachypnea present. No respiratory distress.     Breath sounds: Normal breath sounds. No wheezing or rales.     Comments: Patient has some increased work of breathing with minimal movement. Chest:     Chest wall: No tenderness.  Abdominal:  General: Bowel sounds are normal.     Palpations: Abdomen is soft.     Tenderness: There is no abdominal tenderness. There is no guarding or rebound.  Musculoskeletal:        General: Normal range of motion.     Cervical back: Normal range of motion and neck supple.     Right lower leg: Edema present.     Left lower leg: Edema present.     Comments: Marked edema to lower extremities bilaterally  Lymphadenopathy:     Cervical: No cervical adenopathy.  Skin:    General: Skin is warm and dry.     Findings: No rash.  Neurological:     Mental Status: She is alert and oriented to person, place, and time.     ED Results / Procedures / Treatments   Labs (all labs ordered are listed, but only abnormal results are displayed) Labs Reviewed  CBC - Abnormal; Notable for the following components:      Result Value   WBC 15.1 (*)    All other components within normal limits  BASIC METABOLIC PANEL - Abnormal; Notable for the following components:   Potassium 3.4 (*)    Glucose, Bld 110 (*)    GFR, Estimated 58 (*)    All other components within normal limits  BRAIN NATRIURETIC PEPTIDE - Abnormal; Notable for the following components:   B Natriuretic Peptide 391.8 (*)    All other components within normal limits  TROPONIN I (HIGH  SENSITIVITY) - Abnormal; Notable for the following components:   Troponin I (High Sensitivity) 19 (*)    All other components within normal limits  RESP PANEL BY RT-PCR (RSV, FLU A&B, COVID)  [id number]  TROPONIN I (HIGH SENSITIVITY)    EKG EKG Interpretation Date/Time:  Friday June 02 2023 12:04:56 EDT Ventricular Rate:  101 PR Interval:    QRS Duration:  68 QT Interval:  338 QTC Calculation: 438 R Axis:   20  Text Interpretation: Atrial fibrillation with rapid ventricular response Abnormal ECG When compared with ECG of 17-Mar-2022 23:42, PREVIOUS ECG IS PRESENT since last tracing no significant change Confirmed by Rolan Bucco [phone]) on 06/02/2023 12:17:51 PM  Radiology DG Chest 2 View  Result Date: 06/02/2023 CLINICAL DATA:  Shortness of breath EXAM: CHEST - 2 VIEW COMPARISON:  X-ray 11/09/2022 FINDINGS: No pneumothorax. Enlarged cardiopericardial silhouette. Calcified aorta. Tiny pleural effusions. There are some interstitial changes seen of the lungs bilaterally. Right midlung scar or atelectasis. No consolidation. Film is rotated to the right. Degenerative changes of the spine. IMPRESSION: Enlarged cardiopericardial silhouette. There is some interstitial changes seen of the lungs which are increasing. Acute process is not excluded. Recommend follow-up. Tiny pleural effusions. Film is rotated to the right. Electronically Signed   By: Karen Kays M.D.   On: 06/02/2023 13:21    Procedures Procedures    Medications Ordered in ED Medications  furosemide (LASIX) injection 60 mg (has no administration in time range)    ED Course/ Medical Decision Making/ A&P                             Medical Decision Making Amount and/or Complexity of Data Reviewed Labs: ordered. Radiology: ordered.  Risk Prescription drug management. Decision regarding hospitalization.   Patient is a 82 year old with a history of A-fib, CHF lymphedema.  She has had some worsening shortness of breath  over the last few days.  Chest x-ray was  performed which was interpreted by me and confirmed by the radiologist to show some mild increase in interstitial markings.  No definite infiltrate.  She is afebrile.  She does not have a productive cough.  Her BNP is elevated at a little over 300.  Her troponin is mildly elevated at 19.  Awaiting on a repeat.  No ischemic changes noted on her EKG.  I suspect that this is related to some fluid overload rather than infection.  Will give her a dose of IV Lasix.  I spoke with Dr. Chipper Herb who will admit the patient for further treatment.  She was mildly hypoxic with oxygen saturation is 89 to 90% on room air and is placed on a nasal cannula.  Final Clinical Impression(s) / ED Diagnoses Final diagnoses:  Shortness of breath  Acute on chronic congestive heart failure, unspecified heart failure type [facility])    Rx / DC Orders ED Discharge Orders     None         Rolan Bucco, MD 06/02/23 1521

## 2023-06-02 NOTE — ED Notes (Signed)
ED TO INPATIENT HANDOFF REPORT  ED Nurse Name and Phone #: Grover Canavan [phone]  S Name/Age/Gender Kristen [name] 82 y.o. female Room/Bed: 009C/009C  Code Status   Code Status: Full Code  Home/SNF/Other Home Patient oriented to: self, place, time, and situation Is this baseline? Yes   Triage Complete: Triage complete  Chief Complaint CHF (congestive heart failure) (HCC) [I50.9]  Triage Note Patient from home via EMS for eval of two days of sob, malaise, diarrhea. Denies abdominal pain.    Allergies Allergies  Allergen Reactions   Dome-Paste Bandage [Wound Dressings] Other (See Comments)    bleeding   Flecainide Anaphylaxis and Other (See Comments)    Required hospitalization   Flecainide Acetate Anaphylaxis, Shortness Of Breath and Swelling     (hospitalized)   Ciprofloxacin Other (See Comments)    Tingling in fingers    Level of Care/Admitting Diagnosis ED Disposition     ED Disposition  Admit   Condition  --   Comment  Hospital Area: [facility] [[id number]]  Level of Care: Telemetry Cardiac [[id number]]  May admit patient to [facility] or [facility] if equivalent level of care is available:: No  Covid Evaluation: Asymptomatic - no recent exposure (last 10 days) testing not required  Diagnosis: CHF (congestive heart failure) [facility]) [[id number]]  Admitting Physician: Emeline General [[id number]]  Attending Physician: [facility] [[id number]]  Certification:: I certify this patient will need inpatient services for at least 2 midnights  Estimated Length of Stay: 2          B Medical/Surgery History Past Medical History:  Diagnosis Date   Abscess of abdominal cavity (HCC) 10/02/2019   Anticoagulant long-term use    elquis -- managed by cardiology   Anxiety    SITUATIONAL IN PAST   Atrial fibrillation (HCC)    Chronic calculous cholecystitis    Chronic kidney disease    ckd stage 4 per lov dr [name] 09-28-2020 on chart   Chronic venous  insufficiency    w/  varicose veins right worse than left waers compresiion stocking prn   Depression    SITUATIONAL IN PAST   Diastolic CHF, chronic (HCC) 2010   followed by cardiology HX OF 2010 DUE TO FLECANIDE   Diverticulosis of colon    Dyspnea    occ with heavt activity and at bedtime when tired for last few months per pt   Dysrhythmia    a-fib   Edema of both lower extremities    per pt mostly in summer time   Essential hypertension, benign    Fibrocystic breast disease    Full dentures    GERD (gastroesophageal reflux disease)    occasional tums and does not eat prior to bedtime   Gout    08-19-2019  per pt last episode DEC 2021 LEFT HAND   Hiatal hernia    History of diverticulitis 01/22/2016   History of recurrent UTIs    NONE RECNET SEES DR WRENN   Hx of cholecystectomy 10/02/2019   Hyperlipidemia    Insomnia    Migraines    OA (osteoarthritis)    knees, lower back   Permanent atrial fibrillation [facility]) cardiologist--- [name]   first dx 10/ 2011--- histroy DCCV 11-25-2010 by [name] (pt's previous cardiologist) and Cardiac cath 12-17-2010 no significant disease   Pneumonia 08/2019   COLLAPSED LEFT LUNG AND PNEUMONIA   Scoliosis    Stress incontinence in female    Past Surgical History:  Procedure Laterality Date   BILIARY STENT PLACEMENT N/A 09/03/2019   Procedure: BILIARY STENT PLACEMENT;  Surgeon: [name], MD;  Location: [facility];  Service: Endoscopy;  Laterality: N/A;   BLEPHAROPLASTY Bilateral 02-22-2010  [name]   upper eyelid's   both knees cortisone injection  08/18/2020   [name]   BUNIONECTOMY Right 2003   CATARACT EXTRACTION W/ INTRAOCULAR LENS  IMPLANT, BILATERAL  2018   CERVICAL CONIZATION W/BX N/A 11/05/2020   Procedure: CONIZATION CERVIX WITH BIOPSY;  Surgeon: [name], MD;  Location: [facility] [place];  Service: Gynecology;  Laterality: N/A;   CHOLECYSTECTOMY N/A 08/22/2019   Procedure: LAPAROSCOPIC  CHOLECYSTECTOMY;  Surgeon: [name], [name], MD;  Location: [facility];  Service: General;  Laterality: N/A;   ERCP N/A 09/03/2019   Procedure: ENDOSCOPIC RETROGRADE CHOLANGIOPANCREATOGRAPHY (ERCP);  Surgeon: [name], MD;  Location: [facility];  Service: Endoscopy;  Laterality: N/A;   ESOPHAGOGASTRODUODENOSCOPY (EGD) WITH PROPOFOL N/A 12/06/2019   Procedure: ESOPHAGOGASTRODUODENOSCOPY (EGD) WITH PROPOFOL;  Surgeon: [name], MD;  Location: [facility];  Service: Endoscopy;  Laterality: N/A;  stent removal, ERCP Scope, needs Flouro   HYSTEROSCOPY WITH D & C N/A 11/05/2020   Procedure: DILATATION AND CURETTAGE /HYSTEROSCOPY WITH MYOSURE;  Surgeon: [name], MD;  Location: [facility];  Service: Gynecology;  Laterality: N/A;   IR SINUS/FIST TUBE CHK-NON GI  09/10/2019   IR SINUS/FIST TUBE CHK-NON GI  09/24/2019   KNEE ARTHROSCOPY Left 2002   meniscus repair   REMOVAL OF STONES  09/03/2019   Procedure: REMOVAL OF STONES;  Surgeon: [name], MD;  Location: [facility];  Service: Endoscopy;;   SPHINCTEROTOMY  09/03/2019   Procedure: Dennison Mascot;  Surgeon: [name], MD;  Location: [facility];  Service: Endoscopy;;   STENT REMOVAL  12/06/2019   Procedure: STENT REMOVAL;  Surgeon: [name], MD;  Location: [facility];  Service: Endoscopy;;   TUBAL LIGATION Bilateral 1980   AND RIGHT BREAST EXCISION CYST (BENIGN)     A IV Location/Drains/Wounds Patient Lines/Drains/Airways Status     Active Line/Drains/Airways     Name Placement date Placement time Site Days   Peripheral IV 06/02/23 20 G Anterior;Left;Proximal Forearm 06/02/23  1501  Forearm  less than 1   GI Stent 10 Fr. 09/03/19  1211  --  1368            Intake/Output Last 24 hours No intake or output data in the 24 hours ending 06/02/23 1623  Labs/Imaging Results for orders placed or performed during the hospital encounter of 06/02/23 (from the past 48 hour(s))  CBC      Status: Abnormal   Collection Time: 06/02/23 12:13 PM  Result Value Ref Range   WBC 15.1 (H) 4.0 - 10.5 K/uL   RBC 4.28 3.87 - 5.11 MIL/uL   Hemoglobin 13.1 12.0 - 15.0 g/dL   HCT 16.1 09.6 - 04.5 %   MCV 94.6 80.0 - 100.0 fL   MCH 30.6 26.0 - 34.0 pg   MCHC 32.3 30.0 - 36.0 g/dL   RDW 40.9 81.1 - 91.4 %   Platelets 276 150 - 400 K/uL   nRBC 0.0 0.0 - 0.2 %    Comment: Performed at [facility], [address] [place], [place] [address]  Basic metabolic panel     Status: Abnormal   Collection Time: 06/02/23 12:13 PM  Result Value Ref Range   Sodium 137 135 - 145 mmol/L   Potassium 3.4 (L)  3.5 - 5.1 mmol/L   Chloride 100 98 - 111 mmol/L   CO2 25 22 - 32 mmol/L   Glucose, Bld 110 (H) 70 - 99 mg/dL    Comment: Glucose reference range applies only to samples taken after fasting for at least 8 hours.   BUN 23 8 - 23 mg/dL   Creatinine, Ser 2.13 0.44 - 1.00 mg/dL   Calcium 9.1 8.9 - 08.6 mg/dL   GFR, Estimated 58 (L) >60 mL/min    Comment: (NOTE) Calculated using the CKD-EPI Creatinine Equation (2021)    Anion gap 12 5 - 15    Comment: Performed at [facility], [address], [place], [place] [address]  Troponin I (High Sensitivity)     Status: Abnormal   Collection Time: 06/02/23 12:13 PM  Result Value Ref Range   Troponin I (High Sensitivity) 19 (H) <18 ng/L    Comment: (NOTE) Elevated high sensitivity troponin I (hsTnI) values and significant  changes across serial measurements may suggest ACS but many other  chronic and acute conditions are known to elevate hsTnI results.  Refer to the "Links" section for chest pain algorithms and additional  guidance. Performed at [facility], [address] [place], [place] [address]   Brain natriuretic peptide     Status: Abnormal   Collection Time: 06/02/23 12:13 PM  Result Value Ref Range   B Natriuretic Peptide 391.8 (H) 0.0 - 100.0 pg/mL    Comment: Performed at [facility], [address],  [place], [place] [address]  Troponin I (High Sensitivity)     Status: Abnormal   Collection Time: 06/02/23  2:57 PM  Result Value Ref Range   Troponin I (High Sensitivity) 26 (H) <18 ng/L    Comment: (NOTE) Elevated high sensitivity troponin I (hsTnI) values and significant  changes across serial measurements may suggest ACS but many other  chronic and acute conditions are known to elevate hsTnI results.  Refer to the "Links" section for chest pain algorithms and additional  guidance. Performed at [facility], [address], [place], [place] [address]    DG Chest 2 View  Result Date: 06/02/2023 CLINICAL DATA:  Shortness of breath EXAM: CHEST - 2 VIEW COMPARISON:  X-ray 11/09/2022 FINDINGS: No pneumothorax. Enlarged cardiopericardial silhouette. Calcified aorta. Tiny pleural effusions. There are some interstitial changes seen of the lungs bilaterally. Right midlung scar or atelectasis. No consolidation. Film is rotated to the right. Degenerative changes of the spine. IMPRESSION: Enlarged cardiopericardial silhouette. There is some interstitial changes seen of the lungs which are increasing. Acute process is not excluded. Recommend follow-up. Tiny pleural effusions. Film is rotated to the right. Electronically Signed   By: Karen Kays M.D.   On: 06/02/2023 13:21    Pending Labs Unresulted Labs (From admission, onward)     Start     Ordered   06/03/23 0500  Basic metabolic panel  Daily,   R     Comments: As Scheduled for 5 days    06/02/23 1558   06/02/23 1559  Magnesium  Add-on,   AD        06/02/23 1558   06/02/23 1559  Phosphorus  Add-on,   AD        06/02/23 1558   06/02/23 1459  Resp panel by RT-PCR (RSV, Flu A&B, Covid) Anterior Nasal Swab  Once,   URGENT        06/02/23 1458  Vitals/Pain Today's Vitals   06/02/23 1206 06/02/23 1208 06/02/23 1413 06/02/23 1530  BP:  (!) 140/93 (!) 149/94 (!) 141/79  Pulse:  93 (!) 155 (!) 111  Resp:  20 (!) 24 (!) 24   Temp:  98.3 F (36.8 C) 98.5 F (36.9 C)   TempSrc:  Oral Oral   SpO2:  93% 92% 95%  Weight: 88.5 kg     Height: 5' (1.524 m)     PainSc:        Isolation Precautions No active isolations  Medications Medications  allopurinol (ZYLOPRIM) tablet 100 mg (has no administration in time range)  HYDROcodone-acetaminophen (NORCO/VICODIN) 5-325 MG per tablet 1 tablet (has no administration in time range)  furosemide (LASIX) injection 40 mg (has no administration in time range)  lisinopril (ZESTRIL) tablet 10 mg (has no administration in time range)  metoprolol tartrate (LOPRESSOR) tablet 150 mg (has no administration in time range)  rosuvastatin (CRESTOR) tablet 5 mg (has no administration in time range)  diazepam (VALIUM) tablet 2 mg (has no administration in time range)  calcium carbonate (TUMS - dosed in mg elemental calcium) chewable tablet 200-400 mg of elemental calcium (has no administration in time range)  dicyclomine (BENTYL) capsule 10-20 mg (has no administration in time range)  polycarbophil (FIBERCON) tablet 625 mg (has no administration in time range)  Probiotic-10 CHEW (has no administration in time range)  apixaban (ELIQUIS) tablet 5 mg (has no administration in time range)  fluticasone (FLONASE) 50 MCG/ACT nasal spray 1 spray (has no administration in time range)  loratadine (CLARITIN) tablet 10 mg (has no administration in time range)  digoxin (LANOXIN) 0.25 MG/ML injection 0.125 mg (has no administration in time range)  sodium chloride flush (NS) 0.9 % injection 3 mL (has no administration in time range)  sodium chloride flush (NS) 0.9 % injection 3 mL (has no administration in time range)  0.9 %  sodium chloride infusion (has no administration in time range)  acetaminophen (TYLENOL) tablet 650 mg (has no administration in time range)  ondansetron (ZOFRAN) injection 4 mg (has no administration in time range)  potassium chloride SA (KLOR-CON M) CR tablet 40 mEq (has no  administration in time range)  furosemide (LASIX) injection 60 mg (60 mg Intravenous Given 06/02/23 1517)    Mobility walks with device     Focused Assessments Pulmonary Assessment Handoff:  Lung sounds: Bilateral Breath Sounds: Fine crackles O2 Device: Nasal Cannula O2 Flow Rate (L/min): 3 L/min    R Recommendations: See Admitting Provider Note  Report given to:   Additional Notes:

## 2023-06-02 NOTE — ED Notes (Signed)
Patients soiled linens and brief changed. Patient now dry and clean.

## 2023-06-02 NOTE — ED Provider Triage Note (Signed)
Emergency Medicine Provider Triage Evaluation Note  Kristen [name] , a 82 y.o. female  was evaluated in triage.  Pt complains of shortness of breath over the last couple of days.  She has had some increased swelling in her legs although she has a history of venous stasis and has not been using her stockings.  No associated chest pain other than she had a little bit last night which has resolved..  Review of Systems  Positive: Shortness of breath, leg swelling, chest pain Negative: Fevers  Physical Exam  BP (!) 140/93 (BP Location: Right Arm)   Pulse 93   Temp 98.3 F (36.8 C) (Oral)   Resp 20   Ht 5' (1.524 m)   Wt 88.5 kg   SpO2 93%   BMI 38.08 kg/m  Gen:   Awake, no distress    Resp:  Normal effort   MSK:   Moves extremities without difficulty   Other:  Marked swelling to the lower extremities bilaterally  Medical Decision Making  Medically screening exam initiated at 12:37 PM.  Appropriate orders placed.  Kristen [name] was informed that the remainder of the evaluation will be completed by another provider, this initial triage assessment does not replace that evaluation, and the importance of remaining in the ED until their evaluation is complete.      Rolan Bucco, MD 06/02/23 [phone]

## 2023-06-02 NOTE — Progress Notes (Signed)
Approximately 1800--Pt arrived to [facility] from ED. Upon arrival, pt A&O x 4 and VSS. Pt currently on 4L O2 [place]. Full assessment completed by this RN. Telemetry connected and notified.

## 2023-06-02 NOTE — H&P (Signed)
History and Physical    Kristen [name] ZOX:[id number] DOB: 04-10-1941 DOA: 06/02/2023  PCP: [name], NP (Confirm with patient/family/NH records and if not entered, this has to be entered at [facility] point of entry) Patient coming from: Home  I have personally briefly reviewed patient's old medical records in [facility]  Chief Complaint: Palpitations, shortness of breath  HPI: Kristen [name] is a 82 y.o. female with medical history significant of PAF on Eliquis, chronic HFpEF, HTN, CKD stage II, chronic lymphedema, gout, presented with increasing shortness of breath.  Patient started noticed increasing of bilateral lower extremity edema for last 4-5 weeks, for which she has been taking over-the-counter " lymphedema medications" despite, she continue to notice increasing swelling in her legs.  Yesterday, patient started to feel shortness of breath, triggered by activity, denies any chest pain and last night, patient started develop orthopnea and could not sleep flat whole night.  No cough no chest pain no fever or chills.  Patient has chronic A-fib not taking metoprolol 100 mg twice daily for rate control, 6 months ago, she developed uncontrolled A-fib and CHF, when she traveled to [place], she was diuresis and on discharge her metoprolol was increased to 100 mg twice daily on discharge  ED Course: In and out of A-fib heart rate ranging from 70-120s, blood pressure 140/90, afebrile O2 sat ration 95% on 2 L.  Chest x-ray showed bilateral pulmonary congestion, blood work showed K3.4, creatinine 0.9, WBC 15.1  Patient was given IV Lasix 40 mg in the ED  Review of Systems: As per HPI otherwise 14 point review of systems negative.   Past Medical History:  Diagnosis Date   Abscess of abdominal cavity (HCC) 10/02/2019   Anticoagulant long-term use    elquis -- managed by cardiology   Anxiety    SITUATIONAL IN PAST   Atrial fibrillation (HCC)    Chronic calculous cholecystitis     Chronic kidney disease    ckd stage 4 per lov dr [name] 09-28-2020 on chart   Chronic venous insufficiency    w/  varicose veins right worse than left waers compresiion stocking prn   Depression    SITUATIONAL IN PAST   Diastolic CHF, chronic (HCC) 2010   followed by cardiology HX OF 2010 DUE TO FLECANIDE   Diverticulosis of colon    Dyspnea    occ with heavt activity and at bedtime when tired for last few months per pt   Dysrhythmia    a-fib   Edema of both lower extremities    per pt mostly in summer time   Essential hypertension, benign    Fibrocystic breast disease    Full dentures    GERD (gastroesophageal reflux disease)    occasional tums and does not eat prior to bedtime   Gout    08-19-2019  per pt last episode DEC 2021 LEFT HAND   Hiatal hernia    History of diverticulitis 01/22/2016   History of recurrent UTIs    NONE RECNET SEES DR WRENN   Hx of cholecystectomy 10/02/2019   Hyperlipidemia    Insomnia    Migraines    OA (osteoarthritis)    knees, lower back   Permanent atrial fibrillation [facility]) cardiologist--- [name]   first dx 10/ 2011--- histroy DCCV 11-25-2010 by [name] (pt's previous cardiologist) and Cardiac cath 12-17-2010 no significant disease   Pneumonia 08/2019   COLLAPSED LEFT LUNG AND PNEUMONIA   Scoliosis    Stress incontinence  in female     Past Surgical History:  Procedure Laterality Date   BILIARY STENT PLACEMENT N/A 09/03/2019   Procedure: BILIARY STENT PLACEMENT;  Surgeon: [name], MD;  Location: [facility];  Service: Endoscopy;  Laterality: N/A;   BLEPHAROPLASTY Bilateral 02-22-2010  [name]   upper eyelid's   both knees cortisone injection  08/18/2020   [name]   BUNIONECTOMY Right 2003   CATARACT EXTRACTION W/ INTRAOCULAR LENS  IMPLANT, BILATERAL  2018   CERVICAL CONIZATION W/BX N/A 11/05/2020   Procedure: CONIZATION CERVIX WITH BIOPSY;  Surgeon: [name], MD;  Location: [facility];  Service: Gynecology;  Laterality: N/A;   CHOLECYSTECTOMY N/A 08/22/2019   Procedure: LAPAROSCOPIC CHOLECYSTECTOMY;  Surgeon: [name], [name], MD;  Location: [facility];  Service: General;  Laterality: N/A;   ERCP N/A 09/03/2019   Procedure: ENDOSCOPIC RETROGRADE CHOLANGIOPANCREATOGRAPHY (ERCP);  Surgeon: [name], MD;  Location: [facility];  Service: Endoscopy;  Laterality: N/A;   ESOPHAGOGASTRODUODENOSCOPY (EGD) WITH PROPOFOL N/A 12/06/2019   Procedure: ESOPHAGOGASTRODUODENOSCOPY (EGD) WITH PROPOFOL;  Surgeon: [name], MD;  Location: [facility];  Service: Endoscopy;  Laterality: N/A;  stent removal, ERCP Scope, needs Flouro   HYSTEROSCOPY WITH D & C N/A 11/05/2020   Procedure: DILATATION AND CURETTAGE /HYSTEROSCOPY WITH MYOSURE;  Surgeon: [name], MD;  Location: [facility];  Service: Gynecology;  Laterality: N/A;   IR SINUS/FIST TUBE CHK-NON GI  09/10/2019   IR SINUS/FIST TUBE CHK-NON GI  09/24/2019   KNEE ARTHROSCOPY Left 2002   meniscus repair   REMOVAL OF STONES  09/03/2019   Procedure: REMOVAL OF STONES;  Surgeon: [name], MD;  Location: [facility];  Service: Endoscopy;;   SPHINCTEROTOMY  09/03/2019   Procedure: Dennison Mascot;  Surgeon: [name], MD;  Location: [facility];  Service: Endoscopy;;   STENT REMOVAL  12/06/2019   Procedure: STENT REMOVAL;  Surgeon: [name], MD;  Location: [facility];  Service: Endoscopy;;   TUBAL LIGATION Bilateral 1980   AND RIGHT BREAST EXCISION CYST (BENIGN)     reports that she has never smoked. She has never used smokeless tobacco. She reports that she does not currently use alcohol. She reports that she does not use drugs.  Allergies  Allergen Reactions   Dome-Paste Bandage [Wound Dressings] Other (See Comments)    bleeding   Flecainide Anaphylaxis and Other (See Comments)    Required hospitalization   Flecainide Acetate Anaphylaxis, Shortness Of Breath and Swelling      (hospitalized)   Ciprofloxacin Other (See Comments)    Tingling in fingers    Family History  Problem Relation Age of Onset   Cancer Mother        ovarian   Kidney disease Father    Alcohol abuse Father    Cancer Maternal Aunt        lung- smoker   Heart disease Maternal Uncle      Prior to Admission medications   Medication Sig Start Date End Date Taking? Authorizing Provider  acetaminophen (TYLENOL) 650 MG CR tablet Take 650-1,300 mg by mouth every 8 (eight) hours as needed for pain.   Yes [provider]  alendronate (FOSAMAX) 10 MG tablet TAKE (1) TABLET BY MOUTH PER WEEK TAKE ON EMPTY STOMACH WITH FULL GLASS OF WATER 09/11/19  Yes Myrene Buddy, MD  allopurinol (ZYLOPRIM) 100 MG tablet TAKE 1 TABLET BY MOUTH EVERY DAY 02/07/23  Yes Adonis Huguenin, NP  apixaban (ELIQUIS) 5 MG TABS tablet  Take 1 tablet (5 mg total) by mouth 2 (two) times daily. 02/17/23  Yes Jake Bathe, MD  calcium carbonate (TUMS - DOSED IN MG ELEMENTAL CALCIUM) 500 MG chewable tablet Chew 1-2 tablets by mouth at bedtime.   Yes [provider]  CALCIUM PO Take 1 tablet by mouth daily.   Yes [provider]  cholecalciferol (VITAMIN D3) 25 MCG (1000 UT) tablet Take 1,000 Units by mouth daily.   Yes [provider]  diazepam (VALIUM) 5 MG tablet TAKE 1/2 TABLET (2.5MG  TOTAL) BY MOUTH AT BEDTIME Patient taking differently: Take 2.5 mg by mouth at bedtime. Take 1/2 tablet (2.5mg  total) by mouth at bedtime 12/17/20  Yes Paige, Victoria J, DO  dicyclomine (BENTYL) 10 MG capsule Take 10-20 mg by mouth 3 (three) times daily as needed for spasms.   Yes [provider]  Flax Oil-Fish Oil-Borage Oil (FISH-FLAX-BORAGE) CAPS Take 1 capsule by mouth daily.   Yes [provider]  fluticasone (FLONASE) 50 MCG/ACT nasal spray Place 1 spray into both nostrils daily. 09/03/21  Yes Jacalyn Lefevre, MD  furosemide (LASIX) 20 MG tablet Take 1 tablet (20 mg total) by mouth daily.  *MUST KEEP APPT FOR FURTHER REFILLS* 12/01/22  Yes Jake Bathe, MD  Garlic 100 MG TABS Take by mouth. DAILY   Yes [provider]  glucosamine-chondroitin 500-400 MG tablet Take 1 tablet by mouth daily.    Yes [provider]  lisinopril (ZESTRIL) 20 MG tablet Patient taking 1/2 tablet (10 mg total) by mouth daily   Yes [provider]  metoprolol succinate (TOPROL-XL) 100 MG 24 hr tablet Take 1.5 tablets (150 mg total) by mouth daily. Take with or immediately following a meal. 10/17/22  Yes Jake Bathe, MD  Multiple Vitamins-Minerals (EMERGEN-C IMMUNE) PACK Take 1 packet by mouth daily.   Yes [provider]  Multiple Vitamins-Minerals (MULTIVITAMIN WOMEN 50+) TABS Take 1 tablet by mouth daily.    Yes [provider]  Nutritional Supplements (ECHINACEA/GOLDEN SEAL PO) Take 1 tablet by mouth daily.    Yes [provider]  polycarbophil (FIBERCON) 625 MG tablet Take 625 mg by mouth daily.   Yes [provider]  potassium chloride (KLOR-CON) 20 MEQ packet USE 1 PACKET BY MOUTH EVERY DAY Patient taking differently: Take 1 packet by mouth daily. 08/11/20  Yes Paige, Lucas Mallow, DO  Probiotic Product (PROBIOTIC-10 PO) Take 1 tablet by mouth daily.    Yes [provider]  Propylene Glycol (SYSTANE COMPLETE) 0.6 % SOLN Place 1 drop into both eyes 4 (four) times daily as needed.   Yes [provider]  rosuvastatin (CRESTOR) 5 MG tablet Take 5 mg by mouth at bedtime. 03/29/21  Yes [provider]  Turmeric (QC TUMERIC COMPLEX PO) Take by mouth. WITH CONDIN DAILY   Yes [provider]    Physical Exam: Vitals:   06/02/23 1206 06/02/23 1208 06/02/23 1413 06/02/23 1530  BP:  (!) 140/93 (!) 149/94 (!) 141/79  Pulse:  93 (!) 155 (!) 111  Resp:  20 (!) 24 (!) 24  Temp:  98.3 F (36.8 C) 98.5 F (36.9 C)   TempSrc:  Oral Oral   SpO2:  93% 92% 95%  Weight: 88.5 kg     Height: 5' (1.524 m)        Constitutional: NAD, calm, comfortable Vitals:   06/02/23 1206 06/02/23 1208 06/02/23 1413 06/02/23 1530  BP:  (!) 140/93 (!) 149/94 (!) 141/79  Pulse:  93 (!) 155 (!) 111  Resp:  20 (!) 24 (!) 24  Temp:  98.3 F (36.8 C) 98.5 F (36.9 C)   TempSrc:  Oral Oral   SpO2:  93% 92% 95%  Weight: 88.5 kg     Height: 5' (1.524 m)      Eyes: PERRL, lids and conjunctivae normal ENMT: Mucous membranes are moist. Posterior pharynx clear of any exudate or lesions.Normal dentition.  Neck: normal, supple, no masses, no thyromegaly Respiratory: clear to auscultation bilaterally, no wheezing, fine crackles on bilateral lower fields, increasing respiratory effort. No accessory muscle use.  Cardiovascular: Irregular heart rate, no murmurs / rubs / gallops.  2+ extremity edema. 2+ pedal pulses. No carotid bruits.  Abdomen: no tenderness, no masses palpated. No hepatosplenomegaly. Bowel sounds positive.  Musculoskeletal: no clubbing / cyanosis. No joint deformity upper and lower extremities. Good ROM, no contractures. Normal muscle tone.  Skin: no rashes, lesions, ulcers. No induration Neurologic: CN 2-12 grossly intact. Sensation intact, DTR normal. Strength 5/5 in all 4.  Psychiatric: Normal judgment and insight. Alert and oriented x 3. Normal mood.    Labs on Admission: I have personally reviewed following labs and imaging studies  CBC: Recent Labs  Lab 06/02/23 1213  WBC 15.1*  HGB 13.1  HCT 40.5  MCV 94.6  PLT 276   Basic Metabolic Panel: Recent Labs  Lab 06/02/23 1213  NA 137  K 3.4*  CL 100  CO2 25  GLUCOSE 110*  BUN 23  CREATININE 0.97  CALCIUM 9.1   GFR: Estimated Creatinine Clearance: 43.5 mL/min (by C-G formula based on SCr of 0.97 mg/dL). Liver Function Tests: No results for input(s): "AST", "ALT", "ALKPHOS", "BILITOT", "PROT", "ALBUMIN" in the last 168 hours. No results for input(s): "LIPASE", "AMYLASE" in the last 168 hours. No results for input(s): "AMMONIA" in  the last 168 hours. Coagulation Profile: No results for input(s): "INR", "PROTIME" in the last 168 hours. Cardiac Enzymes: No results for input(s): "CKTOTAL", "CKMB", "CKMBINDEX", "TROPONINI" in the last 168 hours. BNP (last 3 results) No results for input(s): "PROBNP" in the last 8760 hours. HbA1C: No results for input(s): "HGBA1C" in the last 72 hours. CBG: No results for input(s): "GLUCAP" in the last 168 hours. Lipid Profile: No results for input(s): "CHOL", "HDL", "LDLCALC", "TRIG", "CHOLHDL", "LDLDIRECT" in the last 72 hours. Thyroid Function Tests: No results for input(s): "TSH", "T4TOTAL", "FREET4", "T3FREE", "THYROIDAB" in the last 72 hours. Anemia Panel: No results for input(s): "VITAMINB12", "FOLATE", "FERRITIN", "TIBC", "IRON", "RETICCTPCT" in the last 72 hours. Urine analysis:    Component Value Date/Time   COLORURINE YELLOW 03/18/2022 0031   APPEARANCEUR HAZY (A) 03/18/2022 0031   LABSPEC 1.018 03/18/2022 0031   PHURINE 7.5 03/18/2022 0031   GLUCOSEU NEGATIVE 03/18/2022 0031   HGBUR NEGATIVE 03/18/2022 0031   HGBUR negative 01/31/2008 1402   BILIRUBINUR NEGATIVE 03/18/2022 0031   BILIRUBINUR negative 03/25/2013 1700   KETONESUR NEGATIVE 03/18/2022 0031   PROTEINUR TRACE (A) 03/18/2022 0031   UROBILINOGEN 0.2 03/25/2013 1700   UROBILINOGEN 0.2 07/13/2010 1725   NITRITE NEGATIVE 03/18/2022 0031   LEUKOCYTESUR LARGE (A) 03/18/2022 0031    Radiological Exams on Admission: DG Chest 2 View  Result Date: 06/02/2023 CLINICAL DATA:  Shortness of breath EXAM: CHEST - 2 VIEW COMPARISON:  X-ray 11/09/2022 FINDINGS: No pneumothorax. Enlarged cardiopericardial silhouette. Calcified aorta. Tiny pleural effusions. There are some interstitial changes seen of the lungs bilaterally. Right midlung scar or atelectasis. No consolidation. Film is rotated to the right.  Degenerative changes of the spine. IMPRESSION: Enlarged cardiopericardial silhouette. There is some interstitial  changes seen of the lungs which are increasing. Acute process is not excluded. Recommend follow-up. Tiny pleural effusions. Film is rotated to the right. Electronically Signed   By: Karen Kays M.D.   On: 06/02/2023 13:21    EKG: Independently reviewed.  A-fib, no acute ST changes  Assessment/Plan Principal Problem:   CHF (congestive heart failure) (HCC) Active Problems:   Acute on chronic diastolic CHF (congestive heart failure) (HCC)   A-fib (HCC)  (please populate well all problems here in Problem List. (For example, if patient is on BP meds at home and you resume or decide to hold them, it is a problem that needs to be her. Same for CAD, COPD, HLD and so on)  Acute on chronic HFpEF decompensation -Likely secondary to uncontrolled A-fib -Continue IV diuresis 40 mg Lasix twice daily -Echocardiogram  A-fib with RVR -Trial of digoxin loading 0.125 mg every 6 hours x 2 -Continue p.o. metoprolol and 50 mg twice daily -Continue Eliquis -Chest TSH, replenish K  Uncontrolled hypertension -Continue metoprolol -Increase lisinopril to 20 mg daily  Hypokalemia -P.o. replacement, recheck K level tomorrow -Send magnesium and phosphorus level  Leukocytosis -No cough, otherwise clear breathing symptoms likely related to decompensated CHF.  Low suspicion for pneumonia.  Patient denies any diarrhea.  Will send UA to rule out UTI, monitor off antibiotics tomorrow  Chronic lymphedema -No significant skin changes indicating lymphedema, I proposed to increase her daily Lasix to address the chronic swelling, patient appears to be reluctant to increase diuresis.  Gout -No flareup, continue allopurinol  CKD stage II -Fluid overload, diuresis as above, monitor daily kidney function    DVT prophylaxis: Lovenox Code Status: Full code Family Communication: None at bedside Disposition Plan: sick with significant fluid overload and uncontrolled A-fib, requiring inpatient diuresis and inpatient  management of A-fib, expect more than 2 midnight hospital stay Consults called: None  Admission status: Tele admit   Emeline General MD [facility] Pager [phone]  06/02/2023, 4:59 PM

## 2023-06-02 NOTE — ED Triage Notes (Signed)
Patient from home via EMS for eval of two days of sob, malaise, diarrhea. Denies abdominal pain.

## 2023-06-03 ENCOUNTER — Inpatient Hospital Stay (HOSPITAL_COMMUNITY): Payer: Medicare Other

## 2023-06-03 DIAGNOSIS — R609 Edema, unspecified: Secondary | ICD-10-CM | POA: Diagnosis not present

## 2023-06-03 DIAGNOSIS — I5033 Acute on chronic diastolic (congestive) heart failure: Secondary | ICD-10-CM | POA: Diagnosis not present

## 2023-06-03 DIAGNOSIS — I5031 Acute diastolic (congestive) heart failure: Secondary | ICD-10-CM | POA: Diagnosis not present

## 2023-06-03 DIAGNOSIS — I4821 Permanent atrial fibrillation: Secondary | ICD-10-CM

## 2023-06-03 MED ORDER — LORATADINE 10 MG PO TABS
10.0000 mg | ORAL_TABLET | Freq: Every day | ORAL | Status: DC
  Administered 2023-06-03 – 2023-06-05 (×3): 10 mg via ORAL
  Filled 2023-06-03 (×3): qty 1

## 2023-06-03 NOTE — Progress Notes (Signed)
Bilateral lower extremity venous duplex has been completed. Preliminary results can be found in CV Proc through chart review.   06/03/23 3:03 PM Olen Cordial RVT

## 2023-06-03 NOTE — Plan of Care (Signed)
  Problem: Education: Goal: Ability to demonstrate management of disease process will improve Outcome: Progressing Goal: Ability to verbalize understanding of medication therapies will improve Outcome: Progressing   Problem: Cardiac: Goal: Ability to achieve and maintain adequate cardiopulmonary perfusion will improve Outcome: Progressing   

## 2023-06-03 NOTE — Hospital Course (Signed)
The patient is an elderly 82 year old Caucasian female with past medical history significant for 2 proximal atrial fibrillation on anticoagulation with Eliquis, chronic diastolic heart failure with preserved ejection fraction, hypertension, chronic kidney disease stage II, chronic lymphedema, gout as well as other comorbidities who presented with increasing shortness breath.  Patient noticed that she started having increasing bilateral lower extremity edema for last 4 to 5 weeks and she has been taking over-the-counter "lymphedema medications" despite these she noticed a continued increase in the swelling of her legs.  Yesterday she felt short of breath that was triggered by activity and denied any chest pain and states that she started developing orthopnea and states that she cannot sleep flat at night.  She denies any cough or chest pain.  She has chronic atrial fibrillation taking metoprolol for rate control.  She admits that she developed uncontrolled atrial fibrillation and CHF acute travel to [place] and she was given diuresis and metoprolol at discharge.  She came to the ED for further evaluation and was found to have elevated blood pressure and O2 saturation of 95% on 2 L.  Chest x-ray showed bilateral pulmonary congestion and blood work showed that she had hyponatremia.  She is given IV Lasix in the ED and admitted for further evaluation and Treatment.  Assessment and Plan:  Acute on chronic HFpEF decompensation -Likely secondary to uncontrolled A-fib -Continue IV diuresis 40 mg Lasix twice daily -Echocardiogram to be repeated -Strict I's and O's and Daily Weights -BNP was 391.8 -Continue to Monitor for S/Sx of Volume Overload and repeat CXR in the AM   A-fib with RVR -Trial of digoxin loading 0.125 mg every 6 hours x 2 -Continue p.o. metoprolol and 50 mg twice daily -Continue Eliquis -Chest TSH, replenish K -Continue to Monitor on Telemetry   Uncontrolled hypertension -Continue  metoprolol -Increase lisinopril to 20 mg daily   Hypokalemia -Patient's K+ Level Trend: Recent Labs  Lab 06/02/23 1213 06/03/23 0128  K 3.4* 4.1  -Continue to Monitor and Replete as Necessary -Repeat CMP in the AM    Leukocytosis -No cough, otherwise clear breathing symptoms likely related to decompensated CHF.   -Low suspicion for pneumonia.   -Patient denies any diarrhea.   -WBC Trend: Recent Labs  Lab 06/02/23 1213 06/03/23 0128  WBC 15.1* 10.0  -Will send UA to rule out UTI, monitor off antibiotics tomorrow -Continue to Monitor For S/Sx of Infection and repeat CBC in the AM   Chronic lymphedema -No significant skin changes indicating lymphedema, I proposed to increase her daily Lasix to address the chronic swelling, patient appears to be reluctant to increase diuresis. -Getting IV diuresis now -Check LE Venous Duplex and showed No DVT   Gout -No flareup, continue allopurinol   CKD stage II -In the setting Fluid overload -BUN/Cr Trend: Recent Labs  Lab 06/02/23 1213 06/03/23 0128  BUN 23 22  CREATININE 0.97 1.07*  -C/w Diuresis as above -Avoid Nephrotoxic Medications, Contrast Dyes, Hypotension and Dehydration to Ensure Adequate Renal Perfusion and will need to Renally Adjust Meds -Continue to Monitor and Trend Renal Function carefully and repeat CMP in the AM   Morbid Obesity -Complicates overall prognosis and care -Estimated body mass index is 40.73 kg/m as calculated from the following:   Height as of this encounter: 5' (1.524 m).   Weight as of this encounter: 94.6 kg.  -Weight Loss and Dietary Counseling given

## 2023-06-03 NOTE — Progress Notes (Signed)
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  Patient noticed that she started having increasing bilateral lower extremity edema for last 4 to 5 weeks and she has been taking over-the-counter "lymphedema medications" despite these she noticed a continued increase in the swelling of her legs.  Yesterday she felt short of breath that was triggered by activity and denied any chest pain and states that she started developing orthopnea and states that she cannot sleep flat at night.  She denies any cough or chest pain.  She has chronic atrial fibrillation taking metoprolol for rate control.  She admits that she developed uncontrolled atrial fibrillation and CHF acute travel to [place] and she was given diuresis and metoprolol at discharge.  She came to the ED for further evaluation and was found to have elevated blood pressure and O2 saturation of 95% on 2 L.  Chest x-ray showed bilateral pulmonary congestion and blood work showed that she had hyponatremia.  She is given IV Lasix in the ED and admitted for further evaluation and Treatment.  Assessment and Plan:  Acute on chronic HFpEF decompensation -Likely secondary to uncontrolled A-fib -Continue IV diuresis 40 mg Lasix twice daily -Echocardiogram to be repeated -Strict I's and O's and Daily Weights -BNP was 391.8 -Continue to Monitor for S/Sx of Volume Overload and repeat CXR in the AM   A-fib with RVR -Trial of digoxin loading 0.125 mg every 6 hours x 2 -Continue p.o. metoprolol and 50 mg  twice daily -Continue Eliquis -Chest TSH, replenish K -Continue to Monitor on Telemetry   Uncontrolled hypertension -Continue metoprolol -Increase lisinopril to 20 mg daily   Hypokalemia -Patient's K+ Level Trend: Recent Labs  Lab 06/02/23 1213 06/03/23 0128  K 3.4* 4.1  -Continue to Monitor and Replete as Necessary -Repeat CMP in the AM    Leukocytosis -No cough, otherwise clear breathing symptoms likely related to decompensated CHF.   -Low suspicion for pneumonia.   -Patient denies any diarrhea.   -WBC Trend: Recent Labs  Lab 06/02/23 1213 06/03/23 0128  WBC 15.1* 10.0  -Will send UA to rule out UTI, monitor off antibiotics tomorrow -Continue to Monitor For S/Sx of Infection and repeat CBC in the AM   Chronic lymphedema -No significant skin changes indicating lymphedema, I proposed to increase her daily Lasix to address the chronic swelling, patient appears to be reluctant to increase diuresis. -Getting IV diuresis now -Check LE Venous Duplex and showed No DVT   Gout -No flareup, continue allopurinol   CKD stage II -In the setting Fluid overload -BUN/Cr Trend: Recent Labs  Lab 06/02/23 1213 06/03/23 0128  BUN 23 22  CREATININE 0.97 1.07*  -C/w Diuresis as above -Avoid Nephrotoxic Medications, Contrast Dyes, Hypotension and Dehydration to Ensure Adequate Renal Perfusion and will need to Renally Adjust Meds -Continue to Monitor and Trend Renal Function carefully and repeat CMP in the AM   Morbid Obesity -Complicates overall prognosis and care -Estimated body mass index is 40.73 kg/m as calculated from the following:   Height as of this encounter: 5' (1.524 m).   Weight as of this encounter: 94.6 kg.  -  Weight Loss and Dietary Counseling given   DVT prophylaxis:  apixaban (ELIQUIS) tablet 5 mg    Code Status: Full Code Family Communication: No family present at bedside  Disposition Plan:  Level of care: Telemetry Cardiac Status is: Inpatient Remains  inpatient appropriate because: Needs further diuresis   Consultants:  None  Procedures:  ECHOCARDIOGRAM LE VENOUS DUPLEX  Antimicrobials:  Anti-infectives (From admission, onward)    None       Subjective: Seen and examined at bedside and thinks her breathing is doing little bit better.  She denies any chest pain or lightheadedness with this.  No nausea or vomiting.  States her legs are little bit less swollen.  No other concerns or complaints this time.  Objective: Vitals:   06/03/23 0751 06/03/23 0800 06/03/23 0900 06/03/23 1031  BP: 118/79   122/61  Pulse: 66  91 85  Resp: 19 18 (!) 23 (!) 23  Temp: 98.2 F (36.8 C)   98.6 F (37 C)  TempSrc: Oral   Oral  SpO2: 95% 95% 94% 97%  Weight:      Height:        Intake/Output Summary (Last 24 hours) at 06/03/2023 2006 Last data filed at 06/03/2023 1507 Gross per 24 hour  Intake 560 ml  Output 1000 ml  Net -440 ml   Filed Weights   06/02/23 1206 06/03/23 0428  Weight: 88.5 kg 94.6 kg   Examination: Physical Exam:  Constitutional: WN/WD morbidly obese Caucasian female in no acute distress Respiratory: Diminished to auscultation bilaterally, no wheezing, rales, rhonchi or crackles. Normal respiratory effort and patient is not tachypenic. No accessory muscle use.  Unlabored breathing Cardiovascular: RRR, no murmurs / rubs / gallops. S1 and S2 auscultated.  1-2+ lower extremity edema Abdomen: Soft, non-tender, distended secondary to body habitus. Bowel sounds positive.  GU: Deferred. Musculoskeletal: No clubbing / cyanosis of digits/nails. No joint deformity upper and lower extremities Skin: No rashes, lesions, ulcers on limited skin evaluation. No induration; Warm and dry.  Neurologic: CN 2-12 grossly intact with no focal deficits. Romberg sign and cerebellar reflexes not assessed.  Psychiatric: Normal judgment and insight. Alert and oriented x 3. Normal mood and appropriate affect.   Data Reviewed: I have personally  reviewed following labs and imaging studies  CBC: Recent Labs  Lab 06/02/23 1213 06/03/23 0128  WBC 15.1* 10.0  HGB 13.1 12.5  HCT 40.5 38.2  MCV 94.6 93.2  PLT 276 255   Basic Metabolic Panel: Recent Labs  Lab 06/02/23 1213 06/02/23 1457 06/03/23 0128  NA 137  --  136  K 3.4*  --  4.1  CL 100  --  97*  CO2 25  --  30  GLUCOSE 110*  --  109*  BUN 23  --  22  CREATININE 0.97  --  1.07*  CALCIUM 9.1  --  8.7*  MG  --  2.2  --   PHOS  --  3.5  --    GFR: Estimated Creatinine Clearance: 40.9 mL/min (A) (by C-G formula based on SCr of 1.07 mg/dL (H)). Liver Function Tests: No results for input(s): "AST", "ALT", "ALKPHOS", "BILITOT", "PROT", "ALBUMIN" in the last 168 hours. No results for input(s): "LIPASE", "AMYLASE" in the last 168 hours. No results for input(s): "AMMONIA" in the last 168 hours. Coagulation Profile: No results for input(s): "INR", "PROTIME" in the last 168 hours. Cardiac Enzymes: No results for input(s): "CKTOTAL", "CKMB", "CKMBINDEX", "TROPONINI" in the last 168 hours. BNP (last 3  results) No results for input(s): "PROBNP" in the last 8760 hours. HbA1C: No results for input(s): "HGBA1C" in the last 72 hours. CBG: No results for input(s): "GLUCAP" in the last 168 hours. Lipid Profile: No results for input(s): "CHOL", "HDL", "LDLCALC", "TRIG", "CHOLHDL", "LDLDIRECT" in the last 72 hours. Thyroid Function Tests: Recent Labs    06/02/23 1457  TSH 3.956   Anemia Panel: No results for input(s): "VITAMINB12", "FOLATE", "FERRITIN", "TIBC", "IRON", "RETICCTPCT" in the last 72 hours. Sepsis Labs: No results for input(s): "PROCALCITON", "LATICACIDVEN" in the last 168 hours.  Recent Results (from the past 240 hour(s))  Resp panel by RT-PCR (RSV, Flu A&B, Covid) Anterior Nasal Swab     Status: None   Collection Time: 06/02/23  3:22 PM   Specimen: Anterior Nasal Swab  Result Value Ref Range Status   SARS Coronavirus 2 by RT PCR NEGATIVE NEGATIVE Final    Influenza A by PCR NEGATIVE NEGATIVE Final   Influenza B by PCR NEGATIVE NEGATIVE Final    Comment: (NOTE) The Xpert Xpress SARS-CoV-2/FLU/RSV plus assay is intended as an aid in the diagnosis of influenza from Nasopharyngeal swab specimens and should not be used as a sole basis for treatment. Nasal washings and aspirates are unacceptable for Xpert Xpress SARS-CoV-2/FLU/RSV testing.  Fact Sheet for Patients: BloggerCourse.com  Fact Sheet for Healthcare Providers: SeriousBroker.it  This test is not yet approved or cleared by the [place] FDA and has been authorized for detection and/or diagnosis of SARS-CoV-2 by FDA under an Emergency Use Authorization (EUA). This EUA will remain in effect (meaning this test can be used) for the duration of the COVID-19 declaration under Section 564(b)(1) of the Act, 21 U.S.C. section 360bbb-3(b)(1), unless the authorization is terminated or revoked.     Resp Syncytial Virus by PCR NEGATIVE NEGATIVE Final    Comment: (NOTE) Fact Sheet for Patients: BloggerCourse.com  Fact Sheet for Healthcare Providers: SeriousBroker.it  This test is not yet approved or cleared by the [place] FDA and has been authorized for detection and/or diagnosis of SARS-CoV-2 by FDA under an Emergency Use Authorization (EUA). This EUA will remain in effect (meaning this test can be used) for the duration of the COVID-19 declaration under Section 564(b)(1) of the Act, 21 U.S.C. section 360bbb-3(b)(1), unless the authorization is terminated or revoked.  Performed at [facility], [address], [place], [place] [address]     Radiology Studies: VAS [place] LOWER EXTREMITY VENOUS (DVT)  Result Date: 06/03/2023  Lower Venous DVT Study Patient Name:  Kristen [name]  Date of Exam:   06/03/2023 Medical Rec #: [id number]         Accession #:    [id number] Date of  Birth: 1940-11-24         Patient Gender: F Patient Age:   89 years Exam Location:  [facility] Procedure:      VAS [place] LOWER EXTREMITY VENOUS (DVT) Referring Phys: Marguerita Merles --------------------------------------------------------------------------------  Indications: Swelling.  Risk Factors: None identified. Limitations: Poor ultrasound/tissue interface and patient pain tolerance. Comparison Study: No prior studies. Performing Technologist: Chanda Busing RVT  Examination Guidelines: A complete evaluation includes B-mode imaging, spectral Doppler, color Doppler, and power Doppler as needed of all accessible portions of each vessel. Bilateral testing is considered an integral part of a complete examination. Limited examinations for reoccurring indications may be performed as noted. The reflux portion of the exam is performed with the patient in reverse Trendelenburg.  +---------+---------------+---------+-----------+----------+--------------+ RIGHT    CompressibilityPhasicitySpontaneityPropertiesThrombus  Aging +---------+---------------+---------+-----------+----------+--------------+ CFV      Full           Yes      Yes                                 +---------+---------------+---------+-----------+----------+--------------+ SFJ      Full                                                        +---------+---------------+---------+-----------+----------+--------------+ FV Prox  Full                                                        +---------+---------------+---------+-----------+----------+--------------+ FV Mid   Full                                                        +---------+---------------+---------+-----------+----------+--------------+ FV DistalFull                                                        +---------+---------------+---------+-----------+----------+--------------+ PFV      Full                                                         +---------+---------------+---------+-----------+----------+--------------+ POP      Full           Yes      Yes                                 +---------+---------------+---------+-----------+----------+--------------+ PTV      Full                                                        +---------+---------------+---------+-----------+----------+--------------+ PERO     Full                                                        +---------+---------------+---------+-----------+----------+--------------+   +---------+---------------+---------+-----------+----------+--------------+ LEFT     CompressibilityPhasicitySpontaneityPropertiesThrombus Aging +---------+---------------+---------+-----------+----------+--------------+ CFV      Full           Yes      Yes                                 +---------+---------------+---------+-----------+----------+--------------+  SFJ      Full                                                        +---------+---------------+---------+-----------+----------+--------------+ FV Prox  Full                                                        +---------+---------------+---------+-----------+----------+--------------+ FV Mid   Full                                                        +---------+---------------+---------+-----------+----------+--------------+ FV DistalFull                                                        +---------+---------------+---------+-----------+----------+--------------+ PFV      Full                                                        +---------+---------------+---------+-----------+----------+--------------+ POP      Full           Yes      Yes                                 +---------+---------------+---------+-----------+----------+--------------+ PTV      Full                                                         +---------+---------------+---------+-----------+----------+--------------+ PERO     Full                                                        +---------+---------------+---------+-----------+----------+--------------+    Summary: RIGHT: - There is no evidence of deep vein thrombosis in the lower extremity.  - No cystic structure found in the popliteal fossa.  LEFT: - There is no evidence of deep vein thrombosis in the lower extremity.  - No cystic structure found in the popliteal fossa.  *See table(s) above for measurements and observations.    Preliminary    ECHOCARDIOGRAM COMPLETE  Result Date: 06/03/2023    ECHOCARDIOGRAM REPORT   Patient Name:   Kristen [name] Date of Exam: 06/03/2023 Medical Rec #:  [id number]        Height:       60.0  in Accession #:    [id number]       Weight:       208.6 lb Date of Birth:  [birth year]        BSA:          1.900 m Patient Age:    82 years         BP:           118/68 mmHg Patient Gender: F                HR:           70 bpm. Exam Location:  Inpatient Procedure: 2D Echo, Cardiac Doppler and Color Doppler Indications:    CHF- Acute Diastolic  History:        Patient has prior history of Echocardiogram examinations, most                 recent 02/28/2020. CHF, Arrythmias:Atrial Fibrillation,                 Signs/Symptoms:Dyspnea; Risk Factors:Hypertension and                 Dyslipidemia.  Sonographer:    Raeford Razor Referring Phys: [id number] [facility] General  Sonographer Comments: Image acquisition challenging due to patient body habitus. IMPRESSIONS  1. Left ventricular ejection fraction, by estimation, is 60 to 65%. The left ventricle has normal function. The left ventricle has no regional wall motion abnormalities. There is mild left ventricular hypertrophy. Left ventricular diastolic parameters are indeterminate.  2. Right ventricular systolic function is normal. The right ventricular size is normal.  3. Left atrial size was mildly dilated.  4. The mitral valve is  normal in structure. Mild mitral valve regurgitation. No evidence of mitral stenosis.  5. The aortic valve is normal in structure. Aortic valve regurgitation is not visualized. No aortic stenosis is present.  6. The inferior vena cava is normal in size with greater than 50% respiratory variability, suggesting right atrial pressure of 3 mmHg. FINDINGS  Left Ventricle: Left ventricular ejection fraction, by estimation, is 60 to 65%. The left ventricle has normal function. The left ventricle has no regional wall motion abnormalities. The left ventricular internal cavity size was normal in size. There is  mild left ventricular hypertrophy. Left ventricular diastolic parameters are indeterminate. Right Ventricle: The right ventricular size is normal. No increase in right ventricular wall thickness. Right ventricular systolic function is normal. Left Atrium: Left atrial size was mildly dilated. Right Atrium: Right atrial size was normal in size. Pericardium: There is no evidence of pericardial effusion. Mitral Valve: The mitral valve is normal in structure. Mild mitral valve regurgitation. No evidence of mitral valve stenosis. Tricuspid Valve: The tricuspid valve is normal in structure. Tricuspid valve regurgitation is not demonstrated. No evidence of tricuspid stenosis. Aortic Valve: The aortic valve is normal in structure. Aortic valve regurgitation is not visualized. No aortic stenosis is present. Aortic valve mean gradient measures 2.5 mmHg. Aortic valve peak gradient measures 4.2 mmHg. Aortic valve area, by VTI measures 1.55 cm. Pulmonic Valve: The pulmonic valve was normal in structure. Pulmonic valve regurgitation is not visualized. No evidence of pulmonic stenosis. Aorta: The aortic root is normal in size and structure. Venous: The inferior vena cava is normal in size with greater than 50% respiratory variability, suggesting right atrial pressure of 3 mmHg. IAS/Shunts: No atrial level shunt detected by color flow  Doppler.  LEFT VENTRICLE PLAX 2D LVIDd:  4.20 cm LVIDs:         2.80 cm LV PW:         1.20 cm LV IVS:        1.20 cm LVOT diam:     1.70 cm LV SV:         34 LV SV Index:   18 LVOT Area:     2.27 cm  RIGHT VENTRICLE          IVC RV Basal diam:  3.50 cm  IVC diam: 1.80 cm RV Mid diam:    4.30 cm TAPSE (M-mode): 2.3 cm LEFT ATRIUM             Index        RIGHT ATRIUM           Index LA diam:        4.55 cm 2.39 cm/m   RA Area:     23.50 cm LA Vol (A2C):   74.8 ml 39.37 ml/m  RA Volume:   64.40 ml  33.89 ml/m LA Vol (A4C):   38.1 ml 20.05 ml/m LA Biplane Vol: 56.3 ml 29.63 ml/m  AORTIC VALVE AV Area (Vmax):    1.57 cm AV Area (Vmean):   1.53 cm AV Area (VTI):     1.55 cm AV Vmax:           102.90 cm/s AV Vmean:          71.050 cm/s AV VTI:            0.220 m AV Peak Grad:      4.2 mmHg AV Mean Grad:      2.5 mmHg LVOT Vmax:         71.07 cm/s LVOT Vmean:        47.967 cm/s LVOT VTI:          0.151 m LVOT/AV VTI ratio: 0.68  AORTA Ao Root diam: 3.00 cm Ao Asc diam:  3.80 cm MITRAL VALVE MV Area (PHT): 3.39 cm     SHUNTS MV Decel Time: 224 msec     Systemic VTI:  0.15 m MV E velocity: 107.00 cm/s  Systemic Diam: 1.70 cm Donato Schultz MD Electronically signed by Donato Schultz MD Signature Date/Time: 06/03/2023/11:58:17 AM    Final    DG Chest 1 View  Result Date: 06/03/2023 CLINICAL DATA:  82 year old female with history of congestive heart failure. EXAM: CHEST  1 VIEW COMPARISON:  Chest x-ray 06/02/2023. FINDINGS: Lung volumes are low. Diffuse interstitial prominence and widespread peribronchial cuffing. No confluent consolidative airspace disease. Bibasilar areas of subsegmental atelectasis and/or scarring. No definite pleural effusions. Pulmonary vasculature appears mildly engorged. Mild cardiomegaly. Upper mediastinal contours are within normal limits. Atherosclerotic calcifications in the thoracic aorta. IMPRESSION: 1. Cardiomegaly with pulmonary venous congestion, but without frank pulmonary  edema. 2. Chronic diffuse increased interstitial markings with chronic peribronchial cuffing, similar to prior studies, suggesting chronic bronchitis. 3. Aortic atherosclerosis. Electronically Signed   By: Trudie Reed M.D.   On: 06/03/2023 07:26   DG Chest 2 View  Result Date: 06/02/2023 CLINICAL DATA:  Shortness of breath EXAM: CHEST - 2 VIEW COMPARISON:  X-ray 11/09/2022 FINDINGS: No pneumothorax. Enlarged cardiopericardial silhouette. Calcified aorta. Tiny pleural effusions. There are some interstitial changes seen of the lungs bilaterally. Right midlung scar or atelectasis. No consolidation. Film is rotated to the right. Degenerative changes of the spine. IMPRESSION: Enlarged cardiopericardial silhouette. There is some interstitial changes seen of the lungs which are increasing.  Acute process is not excluded. Recommend follow-up. Tiny pleural effusions. Film is rotated to the right. Electronically Signed   By: Karen Kays M.D.   On: 06/02/2023 13:21    Scheduled Meds:  acidophilus  1 capsule Oral Daily   allopurinol  100 mg Oral Daily   apixaban  5 mg Oral BID   calcium carbonate  1-2 tablet Oral QHS   fluticasone  1 spray Each Nare Daily   furosemide  40 mg Intravenous BID   lisinopril  20 mg Oral Daily   loratadine  10 mg Oral Daily   metoprolol tartrate  150 mg Oral BID   polycarbophil  625 mg Oral Daily   rosuvastatin  5 mg Oral QHS   sodium chloride flush  3 mL Intravenous Q12H   Continuous Infusions:  sodium chloride      LOS: 1 day   Marguerita Merles, DO [facility] Available via Epic secure chat 7am-7pm After these hours, please refer to coverage provider listed on [email or web address] 06/03/2023, 8:06 PM

## 2023-06-03 NOTE — Evaluation (Signed)
Occupational Therapy Evaluation Patient Details Name: Kristen [name] MRN: [id number] DOB: 1941-04-21 Today's Date: 06/03/2023   History of Present Illness Pt is an 82 y/o F presenting to ED on 7/26 wtih SOB, mailaise, and diarrhea. Admitted for CHF. PMH includes A fib on Eliquis, HTN, CHF, and lymphedema   Clinical Impression   Pt reports ind at baseline with ADLs and functional mobility, lives alone and reports she has neighbors to assist PRN. Pt currently needing set up -mod A for ADLs, and min guard for transfers without AD. Pt holding onto surfaces (grab bars in bathroom, and countertop) during session. Pt presenting with impairments listed below, will follow acutely. Recommend HHOT at d/c.       Recommendations for follow up therapy are one component of a multi-disciplinary discharge planning process, led by the attending physician.  Recommendations may be updated based on patient status, additional functional criteria and insurance authorization.   Assistance Recommended at Discharge Set up Supervision/Assistance  Patient can return home with the following A little help with bathing/dressing/bathroom;Assistance with cooking/housework;Assist for transportation;Help with stairs or ramp for entrance;A little help with walking and/or transfers    Functional Status Assessment  Patient has had a recent decline in their functional status and demonstrates the ability to make significant improvements in function in a reasonable and predictable amount of time.  Equipment Recommendations  Tub/shower bench    Recommendations for Other Services PT consult     Precautions / Restrictions Precautions Precautions: Fall Restrictions Weight Bearing Restrictions: No      Mobility Bed Mobility               General bed mobility comments: OOB in chair upon arrival nad departure    Transfers Overall transfer level: Needs assistance Equipment used: None Transfers: Sit to/from  Stand Sit to Stand: Min guard                  Balance Overall balance assessment: Mild deficits observed, not formally tested                                         ADL either performed or assessed with clinical judgement   ADL Overall ADL's : Needs assistance/impaired Eating/Feeding: Set up;Sitting   Grooming: Set up;Standing   Upper Body Bathing: Minimal assistance;Sitting   Lower Body Bathing: Moderate assistance;Sitting/lateral leans   Upper Body Dressing : Minimal assistance;Sitting   Lower Body Dressing: Moderate assistance;Sitting/lateral leans   Toilet Transfer: Min guard;Ambulation;Regular Social worker and Hygiene: Min guard       Functional mobility during ADLs: Min guard       Vision Baseline Vision/History: 1 Wears glasses Vision Assessment?: No apparent visual deficits     Perception Perception Perception Tested?: No   Praxis Praxis Praxis tested?: Not tested    Pertinent Vitals/Pain Pain Assessment Pain Assessment: Faces Pain Score: 3  Faces Pain Scale: Hurts a little bit Pain Location: bil knees Pain Descriptors / Indicators: Discomfort Pain Intervention(s): Limited activity within patient's tolerance, Monitored during session, Repositioned     Hand Dominance Right   Extremity/Trunk Assessment Upper Extremity Assessment Upper Extremity Assessment: Generalized weakness   Lower Extremity Assessment Lower Extremity Assessment: Defer to PT evaluation   Cervical / Trunk Assessment Cervical / Trunk Assessment: Normal   Communication Communication Communication: No difficulties   Cognition Arousal/Alertness: Awake/alert Behavior During Therapy: [facility]  for tasks assessed/performed Overall Cognitive Status: Within Functional Limits for tasks assessed                                       General Comments  VSS    Exercises     Shoulder Instructions      Home  Living Family/patient expects to be discharged to:: Private residence Living Arrangements: Alone Available Help at Discharge: Neighbor;Available PRN/intermittently Type of Home: House Home Access: Ramped entrance     Home Layout: One level     Bathroom Shower/Tub: Tub/shower unit         Home Equipment: Grab bars - tub/shower;Grab bars - toilet;Shower Counsellor (2 wheels);Cane - single point;BSC/3in1 (some DME in storage)   Additional Comments: adjustable bed      Prior Functioning/Environment Prior Level of Function : Independent/Modified Independent;Driving             Mobility Comments: takes RW in community ADLs Comments: ind        OT Problem List: Decreased strength;Decreased range of motion;Decreased activity tolerance;Impaired balance (sitting and/or standing)      OT Treatment/Interventions: Self-care/ADL training;Therapeutic exercise;DME and/or AE instruction;Therapeutic activities;Patient/family education;Balance training    OT Goals(Current goals can be found in the care plan section) Acute Rehab OT Goals Patient Stated Goal: none stated OT Goal Formulation: With patient Time For Goal Achievement: 06/17/23 Potential to Achieve Goals: Good  OT Frequency: Min 1X/week    Co-evaluation              AM-PAC OT "6 Clicks" Daily Activity     Outcome Measure Help from another person eating meals?: None Help from another person taking care of personal grooming?: A Little Help from another person toileting, which includes using toliet, bedpan, or urinal?: A Little Help from another person bathing (including washing, rinsing, drying)?: A Little Help from another person to put on and taking off regular upper body clothing?: A Little Help from another person to put on and taking off regular lower body clothing?: A Lot 6 Click Score: 18   End of Session Equipment Utilized During Treatment: Oxygen Nurse Communication: Mobility status  Activity  Tolerance: Patient tolerated treatment well Patient left: with call bell/phone within reach;in chair  OT Visit Diagnosis: Unsteadiness on feet (R26.81);Other abnormalities of gait and mobility (R26.89);Muscle weakness (generalized) (M62.81)                Time: 9604-5409 OT Time Calculation (min): 24 min Charges:  OT General Charges $OT Visit: 1 Visit OT Evaluation $OT Eval Moderate Complexity: 1 Mod OT Treatments $Self Care/Home Management : 8-22 mins  Kristen Fila, OTD, OTR/L [employer] Preferred Acute Rehab [phone]   Kristen [name] 06/03/2023, 3:06 PM

## 2023-06-03 NOTE — Progress Notes (Signed)
Echocardiogram 2D Echocardiogram has been performed.  Kristen [name] 06/03/2023, 8:51 AM

## 2023-06-04 ENCOUNTER — Inpatient Hospital Stay (HOSPITAL_COMMUNITY): Payer: Medicare Other

## 2023-06-04 DIAGNOSIS — I5033 Acute on chronic diastolic (congestive) heart failure: Secondary | ICD-10-CM | POA: Diagnosis not present

## 2023-06-04 DIAGNOSIS — I4821 Permanent atrial fibrillation: Secondary | ICD-10-CM | POA: Diagnosis not present

## 2023-06-04 DIAGNOSIS — D72829 Elevated white blood cell count, unspecified: Secondary | ICD-10-CM

## 2023-06-04 LAB — CBC WITH DIFFERENTIAL/PLATELET
Abs Immature Granulocytes: 0.05 10*3/uL (ref 0.00–0.07)
Basophils Absolute: 0.1 10*3/uL (ref 0.0–0.1)
Basophils Relative: 0 %
Eosinophils Absolute: 0.8 10*3/uL — ABNORMAL HIGH (ref 0.0–0.5)
Eosinophils Relative: 7 %
HCT: 39.1 % (ref 36.0–46.0)
Hemoglobin: 12.6 g/dL (ref 12.0–15.0)
Immature Granulocytes: 0 %
Lymphocytes Relative: 27 %
Lymphs Abs: 3 10*3/uL (ref 0.7–4.0)
MCH: 29.5 pg (ref 26.0–34.0)
MCHC: 32.2 g/dL (ref 30.0–36.0)
MCV: 91.6 fL (ref 80.0–100.0)
Monocytes Absolute: 1.1 10*3/uL — ABNORMAL HIGH (ref 0.1–1.0)
Monocytes Relative: 9 %
Neutro Abs: 6.4 10*3/uL (ref 1.7–7.7)
Neutrophils Relative %: 57 %
Platelets: 247 10*3/uL (ref 150–400)
RBC: 4.27 MIL/uL (ref 3.87–5.11)
RDW: 14.2 % (ref 11.5–15.5)
WBC: 11.3 10*3/uL — ABNORMAL HIGH (ref 4.0–10.5)
nRBC: 0 % (ref 0.0–0.2)

## 2023-06-04 LAB — COMPREHENSIVE METABOLIC PANEL WITH GFR
ALT: 23 U/L (ref 0–44)
AST: 23 U/L (ref 15–41)
Albumin: 3 g/dL — ABNORMAL LOW (ref 3.5–5.0)
Alkaline Phosphatase: 59 U/L (ref 38–126)
Anion gap: 9 (ref 5–15)
BUN: 30 mg/dL — ABNORMAL HIGH (ref 8–23)
CO2: 30 mmol/L (ref 22–32)
Calcium: 8.7 mg/dL — ABNORMAL LOW (ref 8.9–10.3)
Chloride: 94 mmol/L — ABNORMAL LOW (ref 98–111)
Creatinine, Ser: 1.15 mg/dL — ABNORMAL HIGH (ref 0.44–1.00)
GFR, Estimated: 47 mL/min — ABNORMAL LOW (ref >60)
Glucose, Bld: 107 mg/dL — ABNORMAL HIGH (ref 70–99)
Potassium: 3.8 mmol/L (ref 3.5–5.1)
Sodium: 133 mmol/L — ABNORMAL LOW (ref 135–145)
Total Bilirubin: 1.1 mg/dL (ref 0.3–1.2)
Total Protein: 6.7 g/dL (ref 6.5–8.1)

## 2023-06-04 LAB — BRAIN NATRIURETIC PEPTIDE: B Natriuretic Peptide: 96.4 pg/mL (ref 0.0–100.0)

## 2023-06-04 LAB — PHOSPHORUS: Phosphorus: 4.2 mg/dL (ref 2.5–4.6)

## 2023-06-04 LAB — MAGNESIUM: Magnesium: 2.1 mg/dL (ref 1.7–2.4)

## 2023-06-04 MED ORDER — FUROSEMIDE 10 MG/ML IJ SOLN: 40.0000 mg | Freq: Two times a day (BID) | INTRAMUSCULAR | Status: DC

## 2023-06-04 MED ORDER — OXYCODONE HCL 5 MG PO TABS: 5.0000 mg | ORAL_TABLET | Freq: Four times a day (QID) | ORAL | Status: DC | PRN

## 2023-06-04 MED ORDER — FUROSEMIDE 40 MG PO TABS
40.0000 mg | ORAL_TABLET | Freq: Every day | ORAL | Status: DC
  Administered 2023-06-05: 40 mg via ORAL
  Filled 2023-06-04: qty 1

## 2023-06-04 MED ORDER — DICLOFENAC SODIUM 1 % EX GEL
2.0000 g | Freq: Four times a day (QID) | CUTANEOUS | Status: DC
  Administered 2023-06-04 – 2023-06-05 (×3): 2 g via TOPICAL
  Filled 2023-06-04: qty 100

## 2023-06-04 NOTE — Plan of Care (Signed)

## 2023-06-04 NOTE — Consult Note (Signed)
WOC Nurse Consult Note: Reason for Consult:irritant contact dermatitis to the abdominal pannus with RLQ wound Wound type:moisture, irritant contact dermatitis plus friction  ICD-10 CM Codes for Irritant Dermatitis  L24A9 - Due to friction or contact with other specified body fluids L30.4  - Erythema intertrigo. Also used for abrasion of the hand, chafing of the skin, dermatitis due to sweating and friction, friction dermatitis, friction eczema, and genital/thigh intertrigo.   Pressure Injury POA: N/A Measurement:To be obtained today by Bedside RN and documented on nursing flow sheet Wound UJW:JXBJ, moist Drainage (amount, consistency, odor) serous Periwound:moist Dressing procedure/placement/frequency:I have provided guidance for Nursing for the care of this area, specifically, for daily cleansing, placement of a silver hydrofiber to the RLQ and placing our house antimicrobial moisture wicking textile (Inter [employer], [employer] # [phone]) to the entire pannus.  WOC nursing team will not follow, but will remain available to this patient, the nursing and medical teams.  Please re-consult if needed.  Thank you for inviting [place] to participate in this patient's Plan of Care.  Ladona Mow, MSN, RN, CNS, GNP, Leda Min, [employer], [employer] phone:  [phone]

## 2023-06-04 NOTE — Evaluation (Signed)
Physical Therapy Evaluation Patient Details Name: Kristen [name] MRN: [id number] DOB: [birth year] Today's Date: 06/04/2023  History of Present Illness  82 y.o. female presents to [facility] hospital on 06/02/2023 with increasing SOB and BLE edema. Pt admitted for management of acute CHF decompensation. PMH includes PAF, chronic diastolic heart failure  Clinical Impression  Pt presents to PT with deficits in strength, power, gait, balance, endurance. Pt is able to ambulate for short household distances at this time, with reduced endurance compared to baseline. PT encourages frequent mobilization in an effort to improve activity tolerance and stability. PT also recommends use of SPC at the time of discharge to reduce risk for falls.        Assistance Recommended at Discharge PRN  If plan is discharge home, recommend the following:  Can travel by private vehicle  Help with stairs or ramp for entrance;Assistance with cooking/housework        Equipment Recommendations None recommended by PT  Recommendations for Other Services       Functional Status Assessment Patient has had a recent decline in their functional status and demonstrates the ability to make significant improvements in function in a reasonable and predictable amount of time.     Precautions / Restrictions Precautions Precautions: Fall Restrictions Weight Bearing Restrictions: No      Mobility  Bed Mobility Overal bed mobility: Modified Independent Bed Mobility: Supine to Sit     Supine to sit: Modified independent (Device/Increase time), HOB elevated          Transfers Overall transfer level: Needs assistance Equipment used: None Transfers: Sit to/from Stand Sit to Stand: Supervision                Ambulation/Gait Ambulation/Gait assistance: Supervision Gait Distance (Feet): 100 Feet Assistive device:  (PRN use of railing) Gait Pattern/deviations: Step-through pattern Gait velocity: reduced Gait  velocity interpretation: <1.8 ft/sec, indicate of risk for recurrent falls   General Gait Details: slowed step-through gait with reduced stride length  Stairs            Wheelchair Mobility     Tilt Bed    Modified Rankin (Stroke Patients Only)       Balance Overall balance assessment: Needs assistance Sitting-balance support: Feet supported, No upper extremity supported Sitting balance-Leahy Scale: Good     Standing balance support: No upper extremity supported, During functional activity Standing balance-Leahy Scale: Fair                               Pertinent Vitals/Pain Pain Assessment Pain Assessment: 0-10 Pain Score: 9  Pain Location: neck Pain Descriptors / Indicators: Aching Pain Intervention(s): Monitored during session    Home Living Family/patient expects to be discharged to:: Private residence Living Arrangements: Alone Available Help at Discharge:  (none identified) Type of Home: House Home Access: Ramped entrance       Home Layout: One level Home Equipment: Grab bars - tub/shower;Grab bars - toilet;Shower Counsellor (2 wheels);Cane - single point;BSC/3in1;Rollator (4 wheels)      Prior Function Prior Level of Function : Independent/Modified Independent;Driving             Mobility Comments: furniture surfs in the home, unable to utilize a walker in the home due to clutter ADLs Comments: ind     Hand Dominance   Dominant Hand: Right    Extremity/Trunk Assessment   Upper Extremity Assessment Upper Extremity Assessment: Defer to OT  evaluation    Lower Extremity Assessment Lower Extremity Assessment: Generalized weakness    Cervical / Trunk Assessment Cervical / Trunk Assessment: Other exceptions Cervical / Trunk Exceptions: pt reports significant neck pain this morning from sleeping in an uncomfortable position. PT notes decreased cervical rotation, flexion and extension  Communication   Communication:  No difficulties  Cognition Arousal/Alertness: Awake/alert Behavior During Therapy: WFL for tasks assessed/performed Overall Cognitive Status: Within Functional Limits for tasks assessed                                          General Comments General comments (skin integrity, edema, etc.): VSS on RA, sats in low 90s on room air    Exercises     Assessment/Plan    PT Assessment Patient needs continued PT services  PT Problem List Decreased strength;Decreased activity tolerance;Decreased balance;Decreased mobility;Cardiopulmonary status limiting activity       PT Treatment Interventions DME instruction;Gait training;Functional mobility training;Therapeutic activities;Balance training;Patient/family education    PT Goals (Current goals can be found in the Care Plan section)  Acute Rehab PT Goals Patient Stated Goal: to return home PT Goal Formulation: With patient Time For Goal Achievement: 06/18/23 Potential to Achieve Goals: Good Additional Goals Additional Goal #1: Pt will report 1/4 DOE or less when ambulating for >250' to demonstrate improvement in activity tolerance    Frequency Min 1X/week     Co-evaluation               AM-PAC PT "6 Clicks" Mobility  Outcome Measure Help needed turning from your back to your side while in a flat bed without using bedrails?: None Help needed moving from lying on your back to sitting on the side of a flat bed without using bedrails?: None Help needed moving to and from a bed to a chair (including a wheelchair)?: A Little Help needed standing up from a chair using your arms (e.g., wheelchair or bedside chair)?: A Little Help needed to walk in hospital room?: A Little Help needed climbing 3-5 steps with a railing? : A Little 6 Click Score: 20    End of Session   Activity Tolerance: Patient tolerated treatment well Patient left: in chair;with call bell/phone within reach Nurse Communication: Mobility  status PT Visit Diagnosis: Other abnormalities of gait and mobility (R26.89);Muscle weakness (generalized) (M62.81)    Time: 1610-9604 PT Time Calculation (min) (ACUTE ONLY): 24 min   Charges:   PT Evaluation $PT Eval Low Complexity: 1 Low   PT General Charges $$ ACUTE PT VISIT: 1 Visit         Arlyss Gandy, PT, DPT Acute Rehabilitation Office [phone]   Arlyss Gandy 06/04/2023, 10:08 AM

## 2023-06-04 NOTE — Progress Notes (Signed)
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  Patient noticed that she started having increasing bilateral lower extremity edema for last 4 to 5 weeks and she has been taking over-the-counter "lymphedema medications" despite these she noticed a continued increase in the swelling of her legs.  Yesterday she felt short of breath that was triggered by activity and denied any chest pain and states that she started developing orthopnea and states that she cannot sleep flat at night.  She denies any cough or chest pain.  She has chronic atrial fibrillation taking metoprolol for rate control.  She admits that she developed uncontrolled atrial fibrillation and CHF acute travel to [place] and she was given diuresis and metoprolol at discharge.  She came to the ED for further evaluation and was found to have elevated blood pressure and O2 saturation of 95% on 2 L.  Chest x-ray showed bilateral pulmonary congestion and blood work showed that she had hyponatremia.  She is given IV Lasix in the ED and admitted for further evaluation and Treatment. She's improving and Respiratory status is improved. Will change to po diuresis and Anticipate D/C in the AM.   Assessment and Plan:  Acute on chronic HFpEF decompensation -Likely secondary to uncontrolled A-fib -Continued IV diuresis 40 mg Lasix twice daily but gave only 1 dose of 40 mg today and change to po Lasix in the AM -Echocardiogram to be repeated and showed "Left ventricular ejection fraction, by estimation,  is 60 to 65%. The left ventricle has normal function. The left ventricle has no regional wall motion abnormalities. There is mild left ventricular hypertrophy. Left ventricular diastolic  parameters are indeterminate. Right ventricular systolic function is normal. The right ventricular size is normal. Left atrial size was mildly dilated. The mitral valve is normal in structure. Mild mitral valve regurgitation. No evidence of mitral stenosis. The aortic valve is normal in structure. Aortic valve regurgitation is not visualized. No aortic stenosis is present. The inferior vena cava is normal in size with greater than 50%  respiratory variability, suggesting right atrial pressure of 3 mmHg. " -Strict I's and O's and Daily Weights  Intake/Output Summary (Last 24 hours) at 06/04/2023 1816 Last data filed at 06/04/2023 1200 Gross per 24 hour  Intake 543 ml  Output 250 ml  Net 293 ml  -BNP was 391.8 and is now 96.4 -? Accuracy of Weights  -CXR this AM done and showed "Stable cardiomediastinal contours. Chronically coarsened interstitial markings bilaterally. No new airspace consolidation. No pleural effusion. No evidence of a pneumothorax. Patient's chin obscures the right lung apex." -Continue to Monitor for S/Sx of Volume Overload and repeat CXR in the AM   A-fib with RVR; Now A Fib is Rate Controlled -Trial of digoxin loading 0.125 mg every 6 hours x 2 and improved -Continue p.o. metoprolol and 50 mg twice daily -Continue Eliquis -Chest TSH, replenish K -Continue to Monitor on Telemetry -Will need Cardiology Follow up at D/C    Uncontrolled hypertension -Continue Metoprolol -Increase lisinopril to 20 mg daily but may need to cut back if Renal Fxn continues to worsen -Continue to Monitor BP  per Protocol a -Last BP reading was 128/68   Hypokalemia -Patient's K+ Level Trend: Recent Labs  Lab 06/02/23 1213 06/03/23 0128 06/04/23 0052  K 3.4* 4.1 3.8  -Continue to Monitor and Replete as  Necessary -Repeat CMP in the AM    Leukocytosis, mild  -No cough, otherwise clear breathing symptoms likely related to decompensated CHF.   -Low suspicion for pneumonia.   -Patient denies any diarrhea.   -WBC Trend: Recent Labs  Lab 06/02/23 1213 06/03/23 0128 06/04/23 0052  WBC 15.1* 10.0 11.3*  -Will send UA to rule out UTI, Continue to monitor off antibiotics tomorrow -Continue to Monitor For S/Sx of Infection and repeat CBC in the AM   Chronic lymphedema -No significant skin changes indicating lymphedema, I proposed to increase her daily Lasix to address the chronic swelling, patient appears to be reluctant to increase diuresis. -Getting IV diuresis now and will change to po given that she is appearing more Euvolemic -Check LE Venous Duplex and showed No DVT   Gout -No flareup, continue Allopurinol   CKD stage IIIa -Patient states she has CKD Stage 3a and sees Nephrology -In the setting Fluid overload -BUN/Cr Trend: Recent Labs  Lab 06/02/23 1213 06/03/23 0128 06/04/23 0052  BUN 23 22 30*  CREATININE 0.97 1.07* 1.15*  -C/w Diuresis as above -Avoid Nephrotoxic Medications, Contrast Dyes, Hypotension and Dehydration to Ensure Adequate Renal Perfusion and will need to Renally Adjust Meds -Continue to Monitor and Trend Renal Function carefully and repeat CMP in the AM  -Has a Nephrology Appointment in the AM  Irritant Contant Dermatitis, poA -WOC Consult  Hypoalbuminemia -Patient's Albumin Trend: Recent Labs  Lab 06/04/23 0052  ALBUMIN 3.0*  -Continue to Monitor and Trend and repeat CMP in the AM  Morbid Obesity -Complicates overall prognosis and care -Estimated body mass index is 40.73 kg/m as calculated from the following:   Height as of this encounter: 5' (1.524 m).   Weight as of this encounter: 94.6 kg.  -Weight Loss and Dietary Counseling given   DVT prophylaxis:  apixaban (ELIQUIS) tablet 5 mg    Code Status: Full Code Family Communication: No  family present at bedside  Disposition Plan:  Level of care: Telemetry Cardiac Status is: Inpatient Remains inpatient appropriate because: No family present at bedside   Consultants:  None  Procedures:  ECHOCARDIOGRAM IMPRESSIONS     1. Left ventricular ejection fraction, by estimation, is 60 to 65%. The  left ventricle has normal function. The left ventricle has no regional  wall motion abnormalities. There is mild left ventricular hypertrophy.  Left ventricular diastolic parameters  are indeterminate.   2. Right ventricular systolic function is normal. The right ventricular  size is normal.   3. Left atrial size was mildly dilated.   4. The mitral valve is normal in structure. Mild mitral valve  regurgitation. No evidence of mitral stenosis.   5. The aortic valve is normal in structure. Aortic valve regurgitation is  not visualized. No aortic stenosis is present.   6. The inferior vena cava is normal in size with greater than 50%  respiratory variability, suggesting right atrial pressure of 3 mmHg.   FINDINGS   Left Ventricle: Left ventricular ejection fraction, by estimation, is 60  to 65%. The left ventricle has normal function. The left ventricle has no  regional wall motion abnormalities. The left ventricular internal cavity  size was normal in size. There is   mild left ventricular hypertrophy. Left ventricular diastolic parameters  are indeterminate.   Right Ventricle: The right ventricular size is normal. No increase in  right ventricular wall thickness. Right ventricular systolic function is  normal.   Left Atrium: Left atrial size was mildly dilated.   Right Atrium: Right atrial size was normal in size.   Pericardium: There is no evidence of pericardial effusion.   Mitral Valve: The mitral valve is normal in structure. Mild mitral valve  regurgitation. No evidence of mitral valve stenosis.   Tricuspid Valve: The tricuspid valve is normal in structure.  Tricuspid  valve regurgitation is not demonstrated. No evidence of tricuspid  stenosis.   Aortic Valve: The aortic valve is normal in structure. Aortic valve  regurgitation is not visualized. No aortic stenosis is present. Aortic  valve mean gradient measures 2.5 mmHg. Aortic valve peak gradient measures  4.2 mmHg. Aortic valve area, by VTI  measures 1.55 cm.   Pulmonic Valve: The pulmonic valve was normal in structure. Pulmonic valve  regurgitation is not visualized. No evidence of pulmonic stenosis.   Aorta: The aortic root is normal in size and structure.   Venous: The inferior vena cava is normal in size with greater than 50%  respiratory variability, suggesting right atrial pressure of 3 mmHg.   IAS/Shunts: No atrial level shunt detected by color flow Doppler.     LEFT VENTRICLE  PLAX 2D  LVIDd:         4.20 cm  LVIDs:         2.80 cm  LV PW:         1.20 cm  LV IVS:        1.20 cm  LVOT diam:     1.70 cm  LV SV:         34  LV SV Index:   18  LVOT Area:     2.27 cm     RIGHT VENTRICLE          IVC  RV Basal diam:  3.50 cm  IVC diam: 1.80 cm  RV Mid diam:    4.30 cm  TAPSE (M-mode): 2.3 cm   LEFT ATRIUM             Index        RIGHT ATRIUM           Index  LA diam:        4.55 cm 2.39 cm/m   RA Area:     23.50 cm  LA Vol (A2C):   74.8 ml 39.37 ml/m  RA Volume:   64.40 ml  33.89 ml/m  LA Vol (A4C):   38.1 ml 20.05 ml/m  LA Biplane Vol: 56.3 ml 29.63 ml/m   AORTIC VALVE  AV Area (Vmax):    1.57 cm  AV Area (Vmean):   1.53 cm  AV Area (VTI):     1.55 cm  AV Vmax:           102.90 cm/s  AV Vmean:          71.050 cm/s  AV VTI:            0.220 m  AV Peak Grad:      4.2 mmHg  AV Mean Grad:      2.5 mmHg  LVOT Vmax:         71.07 cm/s  LVOT Vmean:        47.967 cm/s  LVOT VTI:          0.151 m  LVOT/AV  VTI ratio: 0.68    AORTA  Ao Root diam: 3.00 cm  Ao Asc diam:  3.80 cm   MITRAL VALVE  MV Area (PHT): 3.39 cm     SHUNTS  MV Decel Time: 224  msec     Systemic VTI:  0.15 m  MV E velocity: 107.00 cm/s  Systemic Diam: 1.70 cm    Antimicrobials:  Anti-infectives (From admission, onward)    None       Subjective: Seen and examined at bedside thinks her breathing is doing better.  Legs are still swollen but not as much but continues to have some discomfort on palpation.  No nausea or vomiting.  No other concerns or complaints at this time.  Objective: Vitals:   06/04/23 1230 06/04/23 1231 06/04/23 1308 06/04/23 1604  BP: (!) 92/37 (!) 86/55 (!) 97/54 128/68  Pulse:   88 82  Resp:   20 18  Temp:    97.9 F (36.6 C)  TempSrc:    Oral  SpO2:    95%  Weight:      Height:        Intake/Output Summary (Last 24 hours) at 06/04/2023 1819 Last data filed at 06/04/2023 1200 Gross per 24 hour  Intake 543 ml  Output 250 ml  Net 293 ml   Filed Weights   06/02/23 1206 06/03/23 0428 06/04/23 0440  Weight: 88.5 kg 94.6 kg 94.6 kg   Examination: Physical Exam:  Constitutional: WN/WD morbidly obese Caucasian female in no acute distress Respiratory: Diminished to auscultation bilaterally with coarse breath sounds and has some crackles, no wheezing, rales, rhonchi. Normal respiratory effort and patient is not tachypenic. No accessory muscle use.  Unlabored breathing and not wearing supplemental oxygen via nasal cannula Cardiovascular: Irregularly irregular but rate controlled, no murmurs / rubs / gallops. S1 and S2 auscultated.  Has 1+ lower extremity edema Abdomen: Soft, non-tender, distended secondary to body habitus. Bowel sounds positive.  GU: Deferred. Musculoskeletal: No clubbing / cyanosis of digits/nails. No joint deformity upper and lower extremities.  Neurologic: CN 2-12 grossly intact with no focal deficits. Romberg sign and cerebellar reflexes not assessed.  Psychiatric: Normal judgment and insight. Alert and oriented x 3. Normal mood and appropriate affect.   Data Reviewed: I have personally reviewed following labs  and imaging studies  CBC: Recent Labs  Lab 06/02/23 1213 06/03/23 0128 06/04/23 0052  WBC 15.1* 10.0 11.3*  NEUTROABS  --   --  6.4  HGB 13.1 12.5 12.6  HCT 40.5 38.2 39.1  MCV 94.6 93.2 91.6  PLT 276 255 247   Basic Metabolic Panel: Recent Labs  Lab 06/02/23 1213 06/02/23 1457 06/03/23 0128 06/04/23 0052  NA 137  --  136 133*  K 3.4*  --  4.1 3.8  CL 100  --  97* 94*  CO2 25  --  30 30  GLUCOSE 110*  --  109* 107*  BUN 23  --  22 30*  CREATININE 0.97  --  1.07* 1.15*  CALCIUM 9.1  --  8.7* 8.7*  MG  --  2.2  --  2.1  PHOS  --  3.5  --  4.2   GFR: Estimated Creatinine Clearance: 38.1 mL/min (A) (by C-G formula based on SCr of 1.15 mg/dL (H)). Liver Function Tests: Recent Labs  Lab 06/04/23 0052  AST 23  ALT 23  ALKPHOS 59  BILITOT 1.1  PROT 6.7  ALBUMIN 3.0*   No results for input(s): "LIPASE", "AMYLASE" in the  last 168 hours. No results for input(s): "AMMONIA" in the last 168 hours. Coagulation Profile: No results for input(s): "INR", "PROTIME" in the last 168 hours. Cardiac Enzymes: No results for input(s): "CKTOTAL", "CKMB", "CKMBINDEX", "TROPONINI" in the last 168 hours. BNP (last 3 results) No results for input(s): "PROBNP" in the last 8760 hours. HbA1C: No results for input(s): "HGBA1C" in the last 72 hours. CBG: No results for input(s): "GLUCAP" in the last 168 hours. Lipid Profile: No results for input(s): "CHOL", "HDL", "LDLCALC", "TRIG", "CHOLHDL", "LDLDIRECT" in the last 72 hours. Thyroid Function Tests: Recent Labs    06/02/23 1457  TSH 3.956   Anemia Panel: No results for input(s): "VITAMINB12", "FOLATE", "FERRITIN", "TIBC", "IRON", "RETICCTPCT" in the last 72 hours. Sepsis Labs: No results for input(s): "PROCALCITON", "LATICACIDVEN" in the last 168 hours.  Recent Results (from the past 240 hour(s))  Resp panel by RT-PCR (RSV, Flu A&B, Covid) Anterior Nasal Swab     Status: None   Collection Time: 06/02/23  3:22 PM   Specimen:  Anterior Nasal Swab  Result Value Ref Range Status   SARS Coronavirus 2 by RT PCR NEGATIVE NEGATIVE Final   Influenza A by PCR NEGATIVE NEGATIVE Final   Influenza B by PCR NEGATIVE NEGATIVE Final    Comment: (NOTE) The Xpert Xpress SARS-CoV-2/FLU/RSV plus assay is intended as an aid in the diagnosis of influenza from Nasopharyngeal swab specimens and should not be used as a sole basis for treatment. Nasal washings and aspirates are unacceptable for Xpert Xpress SARS-CoV-2/FLU/RSV testing.  Fact Sheet for Patients: BloggerCourse.com  Fact Sheet for Healthcare Providers: SeriousBroker.it  This test is not yet approved or cleared by the [place] FDA and has been authorized for detection and/or diagnosis of SARS-CoV-2 by FDA under an Emergency Use Authorization (EUA). This EUA will remain in effect (meaning this test can be used) for the duration of the COVID-19 declaration under Section 564(b)(1) of the Act, 21 U.S.C. section 360bbb-3(b)(1), unless the authorization is terminated or revoked.     Resp Syncytial Virus by PCR NEGATIVE NEGATIVE Final    Comment: (NOTE) Fact Sheet for Patients: BloggerCourse.com  Fact Sheet for Healthcare Providers: SeriousBroker.it  This test is not yet approved or cleared by the [place] FDA and has been authorized for detection and/or diagnosis of SARS-CoV-2 by FDA under an Emergency Use Authorization (EUA). This EUA will remain in effect (meaning this test can be used) for the duration of the COVID-19 declaration under Section 564(b)(1) of the Act, 21 U.S.C. section 360bbb-3(b)(1), unless the authorization is terminated or revoked.  Performed at [facility], [address] [place], [place] [address]     Radiology Studies: VAS [place] LOWER EXTREMITY VENOUS (DVT)  Result Date: 06/04/2023  Lower Venous DVT Study Patient Name:   SERENNA LUCKMAN  Date of Exam:   06/03/2023 Medical Rec #: [id number]         Accession #:    [id number] Date of Birth: 1941-04-07         Patient Gender: F Patient Age:   36 years Exam Location:  [facility] Procedure:      VAS [place] LOWER EXTREMITY VENOUS (DVT) Referring Phys: Marguerita Merles --------------------------------------------------------------------------------  Indications: Swelling.  Risk Factors: None identified. Limitations: Poor ultrasound/tissue interface and patient pain tolerance. Comparison Study: No prior studies. Performing Technologist: Chanda Busing RVT  Examination Guidelines: A complete evaluation includes B-mode imaging, spectral Doppler, color Doppler, and power Doppler as needed of all accessible portions of  each vessel. Bilateral testing is considered an integral part of a complete examination. Limited examinations for reoccurring indications may be performed as noted. The reflux portion of the exam is performed with the patient in reverse Trendelenburg.  +---------+---------------+---------+-----------+----------+--------------+ RIGHT    CompressibilityPhasicitySpontaneityPropertiesThrombus Aging +---------+---------------+---------+-----------+----------+--------------+ CFV      Full           Yes      Yes                                 +---------+---------------+---------+-----------+----------+--------------+ SFJ      Full                                                        +---------+---------------+---------+-----------+----------+--------------+ FV Prox  Full                                                        +---------+---------------+---------+-----------+----------+--------------+ FV Mid   Full                                                        +---------+---------------+---------+-----------+----------+--------------+ FV DistalFull                                                         +---------+---------------+---------+-----------+----------+--------------+ PFV      Full                                                        +---------+---------------+---------+-----------+----------+--------------+ POP      Full           Yes      Yes                                 +---------+---------------+---------+-----------+----------+--------------+ PTV      Full                                                        +---------+---------------+---------+-----------+----------+--------------+ PERO     Full                                                        +---------+---------------+---------+-----------+----------+--------------+   +---------+---------------+---------+-----------+----------+--------------+ LEFT     CompressibilityPhasicitySpontaneityPropertiesThrombus  Aging +---------+---------------+---------+-----------+----------+--------------+ CFV      Full           Yes      Yes                                 +---------+---------------+---------+-----------+----------+--------------+ SFJ      Full                                                        +---------+---------------+---------+-----------+----------+--------------+ FV Prox  Full                                                        +---------+---------------+---------+-----------+----------+--------------+ FV Mid   Full                                                        +---------+---------------+---------+-----------+----------+--------------+ FV DistalFull                                                        +---------+---------------+---------+-----------+----------+--------------+ PFV      Full                                                        +---------+---------------+---------+-----------+----------+--------------+ POP      Full           Yes      Yes                                  +---------+---------------+---------+-----------+----------+--------------+ PTV      Full                                                        +---------+---------------+---------+-----------+----------+--------------+ PERO     Full                                                        +---------+---------------+---------+-----------+----------+--------------+     Summary: RIGHT: - There is no evidence of deep vein thrombosis in the lower extremity.  - No cystic structure found in the popliteal fossa.  LEFT: - There is no evidence of deep vein thrombosis in the lower extremity.  - No cystic structure found in the popliteal  fossa.  *See table(s) above for measurements and observations. Electronically signed by Waverly Ferrari MD on 06/04/2023 at 10:32:13 AM.    Final    DG CHEST PORT 1 VIEW  Result Date: 06/04/2023 CLINICAL DATA:  [id number] CHF exacerbation (HCC) [id number] EXAM: PORTABLE CHEST 1 VIEW COMPARISON:  06/03/2023 FINDINGS: Stable cardiomediastinal contours. Chronically coarsened interstitial markings bilaterally. No new airspace consolidation. No pleural effusion. No evidence of a pneumothorax. Patient's chin obscures the right lung apex. IMPRESSION: Chronically coarsened interstitial markings bilaterally. No new airspace consolidation. Electronically Signed   By: Duanne Guess D.O.   On: 06/04/2023 10:25   ECHOCARDIOGRAM COMPLETE  Result Date: 06/03/2023    ECHOCARDIOGRAM REPORT   Patient Name:   Kristen [name] Date of Exam: 06/03/2023 Medical Rec #:  [id number]        Height:       60.0 in Accession #:    [id number]       Weight:       208.6 lb Date of Birth:  21-Apr-1941        BSA:          1.900 m Patient Age:    82 years         BP:           118/68 mmHg Patient Gender: F                HR:           70 bpm. Exam Location:  Inpatient Procedure: 2D Echo, Cardiac Doppler and Color Doppler Indications:    CHF- Acute Diastolic  History:        Patient has prior history of  Echocardiogram examinations, most                 recent 02/28/2020. CHF, Arrythmias:Atrial Fibrillation,                 Signs/Symptoms:Dyspnea; Risk Factors:Hypertension and                 Dyslipidemia.  Sonographer:    Raeford Razor Referring Phys: [id number] [place] General  Sonographer Comments: Image acquisition challenging due to patient body habitus. IMPRESSIONS  1. Left ventricular ejection fraction, by estimation, is 60 to 65%. The left ventricle has normal function. The left ventricle has no regional wall motion abnormalities. There is mild left ventricular hypertrophy. Left ventricular diastolic parameters are indeterminate.  2. Right ventricular systolic function is normal. The right ventricular size is normal.  3. Left atrial size was mildly dilated.  4. The mitral valve is normal in structure. Mild mitral valve regurgitation. No evidence of mitral stenosis.  5. The aortic valve is normal in structure. Aortic valve regurgitation is not visualized. No aortic stenosis is present.  6. The inferior vena cava is normal in size with greater than 50% respiratory variability, suggesting right atrial pressure of 3 mmHg. FINDINGS  Left Ventricle: Left ventricular ejection fraction, by estimation, is 60 to 65%. The left ventricle has normal function. The left ventricle has no regional wall motion abnormalities. The left ventricular internal cavity size was normal in size. There is  mild left ventricular hypertrophy. Left ventricular diastolic parameters are indeterminate. Right Ventricle: The right ventricular size is normal. No increase in right ventricular wall thickness. Right ventricular systolic function is normal. Left Atrium: Left atrial size was mildly dilated. Right Atrium: Right atrial size was normal in size. Pericardium: There is no evidence of pericardial effusion. Mitral Valve: The mitral valve  is normal in structure. Mild mitral valve regurgitation. No evidence of mitral valve stenosis. Tricuspid Valve: The  tricuspid valve is normal in structure. Tricuspid valve regurgitation is not demonstrated. No evidence of tricuspid stenosis. Aortic Valve: The aortic valve is normal in structure. Aortic valve regurgitation is not visualized. No aortic stenosis is present. Aortic valve mean gradient measures 2.5 mmHg. Aortic valve peak gradient measures 4.2 mmHg. Aortic valve area, by VTI measures 1.55 cm. Pulmonic Valve: The pulmonic valve was normal in structure. Pulmonic valve regurgitation is not visualized. No evidence of pulmonic stenosis. Aorta: The aortic root is normal in size and structure. Venous: The inferior vena cava is normal in size with greater than 50% respiratory variability, suggesting right atrial pressure of 3 mmHg. IAS/Shunts: No atrial level shunt detected by color flow Doppler.  LEFT VENTRICLE PLAX 2D LVIDd:         4.20 cm LVIDs:         2.80 cm LV PW:         1.20 cm LV IVS:        1.20 cm LVOT diam:     1.70 cm LV SV:         34 LV SV Index:   18 LVOT Area:     2.27 cm  RIGHT VENTRICLE          IVC RV Basal diam:  3.50 cm  IVC diam: 1.80 cm RV Mid diam:    4.30 cm TAPSE (M-mode): 2.3 cm LEFT ATRIUM             Index        RIGHT ATRIUM           Index LA diam:        4.55 cm 2.39 cm/m   RA Area:     23.50 cm LA Vol (A2C):   74.8 ml 39.37 ml/m  RA Volume:   64.40 ml  33.89 ml/m LA Vol (A4C):   38.1 ml 20.05 ml/m LA Biplane Vol: 56.3 ml 29.63 ml/m  AORTIC VALVE AV Area (Vmax):    1.57 cm AV Area (Vmean):   1.53 cm AV Area (VTI):     1.55 cm AV Vmax:           102.90 cm/s AV Vmean:          71.050 cm/s AV VTI:            0.220 m AV Peak Grad:      4.2 mmHg AV Mean Grad:      2.5 mmHg LVOT Vmax:         71.07 cm/s LVOT Vmean:        47.967 cm/s LVOT VTI:          0.151 m LVOT/AV VTI ratio: 0.68  AORTA Ao Root diam: 3.00 cm Ao Asc diam:  3.80 cm MITRAL VALVE MV Area (PHT): 3.39 cm     SHUNTS MV Decel Time: 224 msec     Systemic VTI:  0.15 m MV E velocity: 107.00 cm/s  Systemic Diam: 1.70 cm Donato Schultz MD Electronically signed by Donato Schultz MD Signature Date/Time: 06/03/2023/11:58:17 AM    Final    DG Chest 1 View  Result Date: 06/03/2023 CLINICAL DATA:  82 year old female with history of congestive heart failure. EXAM: CHEST  1 VIEW COMPARISON:  Chest x-ray 06/02/2023. FINDINGS: Lung volumes are low. Diffuse interstitial prominence and widespread peribronchial cuffing. No confluent consolidative airspace disease. Bibasilar areas of subsegmental  atelectasis and/or scarring. No definite pleural effusions. Pulmonary vasculature appears mildly engorged. Mild cardiomegaly. Upper mediastinal contours are within normal limits. Atherosclerotic calcifications in the thoracic aorta. IMPRESSION: 1. Cardiomegaly with pulmonary venous congestion, but without frank pulmonary edema. 2. Chronic diffuse increased interstitial markings with chronic peribronchial cuffing, similar to prior studies, suggesting chronic bronchitis. 3. Aortic atherosclerosis. Electronically Signed   By: Trudie Reed M.D.   On: 06/03/2023 07:26    Scheduled Meds:  acidophilus  1 capsule Oral Daily   allopurinol  100 mg Oral Daily   apixaban  5 mg Oral BID   calcium carbonate  1-2 tablet Oral QHS   diclofenac Sodium  2 g Topical QID   fluticasone  1 spray Each Nare Daily   [START ON 06/05/2023] furosemide  40 mg Oral Daily   lisinopril  20 mg Oral Daily   loratadine  10 mg Oral Daily   metoprolol tartrate  150 mg Oral BID   polycarbophil  625 mg Oral Daily   rosuvastatin  5 mg Oral QHS   sodium chloride flush  3 mL Intravenous Q12H   Continuous Infusions:  sodium chloride      LOS: 2 days   Marguerita Merles, DO [facility] Available via Epic secure chat 7am-7pm After these hours, please refer to coverage provider listed on [email or web address] 06/04/2023, 6:19 PM

## 2023-06-05 ENCOUNTER — Inpatient Hospital Stay (HOSPITAL_COMMUNITY): Payer: Medicare Other

## 2023-06-05 DIAGNOSIS — I4821 Permanent atrial fibrillation: Secondary | ICD-10-CM | POA: Diagnosis not present

## 2023-06-05 DIAGNOSIS — I5033 Acute on chronic diastolic (congestive) heart failure: Secondary | ICD-10-CM | POA: Diagnosis not present

## 2023-06-05 MED ORDER — FUROSEMIDE 40 MG PO TABS: 40.0000 mg | ORAL_TABLET | Freq: Every day | ORAL | 0 refills | Status: DC

## 2023-06-05 MED ORDER — DICLOFENAC SODIUM 1 % EX GEL: 2.0000 g | Freq: Four times a day (QID) | CUTANEOUS | 0 refills | Status: AC

## 2023-06-05 NOTE — Progress Notes (Signed)
Mobility Specialist Progress Note:   06/05/23 1128  Mobility  Activity Ambulated with assistance in hallway  Level of Assistance Standby assist, set-up cues, supervision of patient - no hands on  Assistive Device None  Distance Ambulated (ft) 112 ft  Activity Response Tolerated well  Mobility Referral Yes  $Mobility charge 1 Mobility  Mobility Specialist Start Time (ACUTE ONLY) 1052  Mobility Specialist Stop Time (ACUTE ONLY) 1102  Mobility Specialist Time Calculation (min) (ACUTE ONLY) 10 min   Pt received in chair after OT session, agreeable to ambulate in the halls. Needed no physical assistance during ambulation. No c/o throughout session. Pt assisted back to recliner post ambulation. Call bell in hand and all needs met.  Pre Mobility RA SPO2 94% During Mobility RA SPO2 98%   Thompson Grayer Mobility Specialist  Please contact vis Secure Chat or  Rehab Office [phone]

## 2023-06-05 NOTE — Progress Notes (Signed)
Heart Failure Navigator Progress Note  Assessed for Heart & Vascular TOC clinic readiness.  Patient does not meet criteria due to EF 60-65%, has a [facility] scheduled appointment on 06/26/2023. .   Navigator will sign off at this time.   Rhae Hammock, BSN, Scientist, clinical (histocompatibility and immunogenetics) Only

## 2023-06-05 NOTE — Progress Notes (Signed)
Occupational Therapy Treatment Patient Details Name: Kristen [name] MRN: [id number] DOB: 22-Apr-1941 Today's Date: 06/05/2023   History of present illness 82 y.o. female presents to [facility] hospital on 06/02/2023 with increasing SOB and BLE edema. Pt admitted for management of acute CHF decompensation. PMH includes PAF, chronic diastolic heart failure   OT comments  Pt progressing towards goals, needing min guard-min A for ADLs, mod I for bed mobility and supervision for transfers with RW. Pt provided with/educated on use of AE for LB ADL, pt able to demo use of sock aid and reacher for LB dressing. Pt presenting with impairments listed below, will follow acutely. Recommend HHOT at d/c.    Recommendations for follow up therapy are one component of a multi-disciplinary discharge planning process, led by the attending physician.  Recommendations may be updated based on patient status, additional functional criteria and insurance authorization.    Assistance Recommended at Discharge Set up Supervision/Assistance  Patient can return home with the following  A little help with bathing/dressing/bathroom;Assistance with cooking/housework;Assist for transportation;Help with stairs or ramp for entrance;A little help with walking and/or transfers   Equipment Recommendations  Tub/shower bench    Recommendations for Other Services PT consult    Precautions / Restrictions Precautions Precautions: Fall Restrictions Weight Bearing Restrictions: No       Mobility Bed Mobility Overal bed mobility: Modified Independent Bed Mobility: Supine to Sit     Supine to sit: Modified independent (Device/Increase time), HOB elevated          Transfers Overall transfer level: Needs assistance Equipment used: None Transfers: Sit to/from Stand Sit to Stand: Supervision                 Balance Overall balance assessment: Needs assistance Sitting-balance support: Feet supported, No upper extremity  supported Sitting balance-Leahy Scale: Good     Standing balance support: No upper extremity supported, During functional activity Standing balance-Leahy Scale: Fair                             ADL either performed or assessed with clinical judgement   ADL Overall ADL's : Needs assistance/impaired     Grooming: Min guard;Wash/dry face;Standing           Upper Body Dressing : Minimal assistance;Standing   Lower Body Dressing: Minimal assistance;With adaptive equipment;Sitting/lateral leans   Toilet Transfer: Min guard;Ambulation   Toileting- Clothing Manipulation and Hygiene: Min guard       Functional mobility during ADLs: Min guard      Extremity/Trunk Assessment Upper Extremity Assessment Upper Extremity Assessment: Generalized weakness   Lower Extremity Assessment Lower Extremity Assessment: Defer to PT evaluation        Vision   Vision Assessment?: No apparent visual deficits   Perception Perception Perception: Not tested   Praxis Praxis Praxis: Not tested    Cognition Arousal/Alertness: Awake/alert Behavior During Therapy: WFL for tasks assessed/performed Overall Cognitive Status: Within Functional Limits for tasks assessed                                          Exercises      Shoulder Instructions       General Comments VSS on RA    Pertinent Vitals/ Pain       Pain Assessment Pain Assessment: Faces Pain Score: 2  Faces Pain Scale:  Hurts a little bit Pain Location: neck Pain Descriptors / Indicators: Discomfort Pain Intervention(s): Limited activity within patient's tolerance, Monitored during session, Repositioned  Home Living                                          Prior Functioning/Environment              Frequency  Min 1X/week        Progress Toward Goals  OT Goals(current goals can now be found in the care plan section)  Progress towards OT goals: Progressing  toward goals  Acute Rehab OT Goals Patient Stated Goal: to eat lunch OT Goal Formulation: With patient Time For Goal Achievement: 06/17/23 Potential to Achieve Goals: Good ADL Goals Pt Will Perform Upper Body Dressing: with supervision;sitting Pt Will Perform Lower Body Dressing: with supervision;sitting/lateral leans;sit to/from stand Pt Will Transfer to Toilet: with supervision;ambulating;regular height toilet Pt Will Perform Tub/Shower Transfer: with supervision;ambulating;Tub transfer;Shower transfer;shower seat  Plan Discharge plan remains appropriate;Frequency remains appropriate    Co-evaluation                 AM-PAC OT "6 Clicks" Daily Activity     Outcome Measure   Help from another person eating meals?: None Help from another person taking care of personal grooming?: A Little Help from another person toileting, which includes using toliet, bedpan, or urinal?: A Little Help from another person bathing (including washing, rinsing, drying)?: A Little Help from another person to put on and taking off regular upper body clothing?: A Little Help from another person to put on and taking off regular lower body clothing?: A Little 6 Click Score: 19    End of Session    OT Visit Diagnosis: Unsteadiness on feet (R26.81);Other abnormalities of gait and mobility (R26.89);Muscle weakness (generalized) (M62.81)   Activity Tolerance Patient tolerated treatment well   Patient Left in chair;with call bell/phone within reach   Nurse Communication Mobility status        Time: 0981-1914 OT Time Calculation (min): 34 min  Charges: OT General Charges $OT Visit: 1 Visit OT Treatments $Self Care/Home Management : 23-37 mins  Carver Fila, OTD, OTR/L SecureChat Preferred Acute Rehab [phone]   Mateya Torti K Koonce 06/05/2023, 11:06 AM

## 2023-06-05 NOTE — TOC Initial Note (Signed)
Transition of Care [facility]) - Initial/Assessment Note    Patient Details  Name: Kristen [name] MRN: [id number] Date of Birth: [birth year]  Transition of Care [facility]) CM/SW Contact:    Leone Haven, RN Phone Number: 06/05/2023, 11:41 AM  Clinical Narrative:                 Patient form home alone, she has PCP at [facility] , she has insurance on file, she states she currently does not have any HH services in place , she does and a walker and couple canes at home.  She state she drives her self to the [place] [facility]. She states she has no family support, she does have a friend  who lives in [place], she gets her medications from [facility] on [address]  Pta she ambulates with a walker.  She states she has fans at her home. She was brought to the hospital by ambulance , she states she will call [facility] after she eats lunch to see if they will be able to transport her home. If they are not able to transport her home , NCM will ast her with a voucher.  Expected Discharge Plan: Home/Self Care Barriers to Discharge: No Barriers Identified   Patient Goals and CMS Choice Patient states their goals for this hospitalization and ongoing recovery are:: return home   Choice offered to / list presented to : NA      Expected Discharge Plan and Services In-house Referral: NA Discharge Planning Services: CM Consult Post Acute Care Choice: NA Living arrangements for the past 2 months: Mobile Home Expected Discharge Date: 06/05/23                 DME Agency: NA       HH Arranged: NA          Prior Living Arrangements/Services Living arrangements for the past 2 months: Mobile Home Lives with:: Self Patient language and need for interpreter reviewed:: Yes Do you feel safe going back to the place where you live?: Yes      Need for Family Participation in Patient Care: Yes (Comment) Care giver support system in place?: No (comment) Current home services: DME (walker, couple of canes, one  is a four prong cane) Criminal Activity/Legal Involvement Pertinent to Current Situation/Hospitalization: No - Comment as needed  Activities of Daily Living      Permission Sought/Granted Permission sought to share information with : Case Manager Permission granted to share information with : Yes, Verbal Permission Granted              Emotional Assessment Appearance:: Appears stated age Attitude/Demeanor/Rapport: Engaged Affect (typically observed): Accepting Orientation: : Oriented to Self, Oriented to Place, Oriented to  Time, Oriented to Situation Alcohol / Substance Use: Not Applicable Psych Involvement: No (comment)  Admission diagnosis:  Shortness of breath [R06.02] CHF (congestive heart failure) (HCC) [I50.9] Acute on chronic congestive heart failure, unspecified heart failure type [facility]) [I50.9] Patient Active Problem List   Diagnosis Date Noted   CHF (congestive heart failure) (HCC) 06/02/2023   Occipital neuralgia of right side 06/10/2021   History of lacunar cerebrovascular accident 06/10/2021   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/2021   Ganglion cyst of volar aspect of right wrist 12/07/2020   Chronic daily headache 07/03/2020   Elevated serum creatinine 05/04/2020   Muscle spasms of both lower extremities 05/04/2020   Seborrheic keratoses 04/30/2020   Skin tag 04/08/2020   Fatigue 03/17/2020   Unsatisfactory  living conditions 10/23/2019   Hx of cholecystectomy 10/02/2019   Acute renal failure (ARF) (HCC) 08/29/2019   A-fib (HCC) 08/29/2019   Varicose veins of leg with edema, right 05/21/2019   Bunion of great toe of right foot 05/21/2019   Osteoarthritis 03/13/2018   Gait abnormality 03/13/2018   Flat wart 02/13/2018   Situational mixed anxiety and depressive disorder 10/27/2017   Abnormal Pap smear of cervix 02/08/2016   Chronic kidney disease 02/08/2016   Urinary frequency 02/08/2016   History of diverticulitis 01/22/2016   Anxiety state  10/17/2014   Long-term (current) use of anticoagulants 06/10/2014   Chronic venous insufficiency 06/10/2014   Obesity (BMI 30-39.9)    Acute on chronic diastolic CHF (congestive heart failure) (HCC)    Candidal intertrigo 06/09/2014   Insomnia 01/10/2011   Permanent atrial fibrillation (HCC)    OSTEOPENIA 01/31/2008   Hypertensive heart disease    PCP:  [name], NP Pharmacy:   [phone] [place], [place] - [address] [place] [address] Phone: [phone] Fax: [phone]  [facility] - [address], [place] - [address] [place] [address] Phone: [phone] Fax: [phone]     Social Determinants of Health (SDOH) Social History: SDOH Screenings   Food Insecurity: Food Insecurity Present (10/25/2019)  Housing: Medium Risk (10/25/2019)  Transportation Needs: No Transportation Needs (10/25/2019)  Depression (PHQ2-9): Low Risk  (12/07/2020)  Financial Resource Strain: Low Risk  (10/11/2018)  Physical Activity: Inactive (10/11/2018)  Social Connections: Somewhat Isolated (10/11/2018)  Stress: No Stress Concern Present (10/11/2018)  Tobacco Use: Low Risk  (04/25/2023)   SDOH Interventions:     Readmission Risk Interventions     No data to display

## 2023-06-05 NOTE — Discharge Summary (Incomplete)
Physician Discharge Summary   Patient: Kristen [name] MRN: [id number] DOB: 06/13/1941  Admit date:     06/02/2023  Discharge date: 06/07/23  Discharge Physician: Marguerita Merles, DO   PCP: [name], NP   Recommendations at discharge:   Follow up with PCP within 1-2 weeks and repeat CBC, CMP, Mag, Phos within 1 week Follow up with Nephrology and call to reschedule appointment that was cancelled this AM by the patient  Follow up with Cardiology within 1-2 weeks and continue with Diuresis   Discharge Diagnoses: Principal Problem:   CHF (congestive heart failure) (HCC) Active Problems:   Acute on chronic diastolic CHF (congestive heart failure) (HCC)   A-fib (HCC)  Resolved Problems:   * No resolved hospital problems. 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  Patient noticed that she started having increasing bilateral lower extremity edema for last 4 to 5 weeks and she has been taking over-the-counter "lymphedema medications" despite these she noticed a continued increase in the swelling of her legs.  Yesterday she felt short of breath that was triggered by activity and denied any chest pain and states that she started developing orthopnea and states that she cannot sleep flat at night.  She denies any cough or chest pain.  She has chronic atrial fibrillation taking metoprolol for rate control.  She admits that she developed uncontrolled atrial fibrillation and CHF acute travel to [place] and she was given diuresis and metoprolol at discharge.  She came to the ED for further evaluation and was found to have elevated blood pressure and  O2 saturation of 95% on 2 L.  Chest x-ray showed bilateral pulmonary congestion and blood work showed that she had hyponatremia.  She is given IV Lasix in the ED and admitted for further evaluation and Treatment. She's improving and Respiratory Status is improved. Changed to po diuresis and will D/C home this AM given her improvement and medical stability.   Assessment and Plan:  Acute on chronic HFpEF decompensation -Likely secondary to uncontrolled A-fib -Continued IV diuresis 40 mg Lasix twice daily but gave only 1 dose of 40 mg yesterday and changed to po Lasix this AM -Echocardiogram to be repeated and showed "Left ventricular ejection fraction, by estimation, is 60 to 65%. The left ventricle has normal function. The left ventricle has no regional wall motion abnormalities. There is mild left ventricular hypertrophy. Left ventricular diastolic  parameters are indeterminate. Right ventricular systolic function is normal. The right ventricular size is normal. Left atrial size was mildly dilated. The mitral valve is normal in structure. Mild mitral valve regurgitation. No evidence of mitral stenosis. The aortic valve is normal in structure. Aortic valve regurgitation is not visualized. No aortic stenosis is present. The inferior vena cava is normal in size with greater than 50%  respiratory variability, suggesting right atrial pressure of 3 mmHg. " -Strict I's and O's and Daily Weights No intake or output data in the 24 hours ending 06/07/23 0036 -BNP was 391.8 and is now 96.4 -? Accuracy of Weights  -CXR yesteday AM done and showed "Stable cardiomediastinal contours. Chronically coarsened interstitial markings bilaterally. No new airspace consolidation. No pleural effusion. No evidence of a pneumothorax. Patient's chin obscures the right lung  apex." -CXR this AM done and showed "Vascular congestion with diffuse bilateral interstitial opacity, likely chronic." -Continue to Monitor for S/Sx of Volume  Overload and repeat CXR in the outpatient setting    A-fib with RVR; Now A Fib is Rate Controlled -Trial of digoxin loading 0.125 mg every 6 hours x 2 and improved -Continue p.o. metoprolol and 50 mg twice daily -Continue Eliquis -Chest TSH, replenish K -Continue to Monitor on Telemetry -Will need Cardiology Follow up at D/C    Uncontrolled hypertension -Continue Metoprolol -Increase lisinopril to 20 mg daily but may need to cut back if Renal Fxn continues to worsen -Continue to Monitor BP per Protocol a -Last BP reading was 111/96   Hypokalemia -Patient's K+ Level Trend: Recent Labs  Lab 06/02/23 1213 06/03/23 0128 06/04/23 0052 06/05/23 0147  K 3.4* 4.1 3.8 4.3  -Continue to Monitor and Replete as Necessary -Repeat CMP in the AM    Leukocytosis, mild  -No cough, otherwise clear breathing symptoms likely related to decompensated CHF.   -Low suspicion for pneumonia.   -Patient denies any diarrhea.   -WBC Trend: Recent Labs  Lab 06/02/23 1213 06/03/23 0128 06/04/23 0052 06/05/23 0147  WBC 15.1* 10.0 11.3* 11.4*  -Will send UA to rule out UTI, Continue to monitor off antibiotics and she is stable -Continue to Monitor For S/Sx of Infection and repeat CBC in the AM   Chronic lymphedema -No significant skin changes indicating lymphedema, I proposed to increase her daily Lasix to address the chronic swelling, patient appears to be reluctant to increase diuresis. -Getting IV diuresis now and will change to po given that she is appearing more Euvolemic -Check LE Venous Duplex and showed No DVT   Gout -No flareup, continue Allopurinol   AKI on CKD stage IIIb -Patient states she has CKD Stage 3b and sees Nephrology (Had Stage IV per patient report and improved to Stage 3) -In the setting Fluid overload -BUN/Cr Trend: Recent Labs  Lab 06/02/23 1213 06/03/23 0128 06/04/23 0052 06/05/23 0147  BUN 23 22 30* 32*  CREATININE 0.97 1.07* 1.15* 1.39*  -C/w Diuresis as  above -Avoid Nephrotoxic Medications, Contrast Dyes, Hypotension and Dehydration to Ensure Adequate Renal Perfusion and will need to Renally Adjust Meds -Continue to Monitor and Trend Renal Function carefully and repeat CMP in the AM  -Had a Nephrology Appointment this Afternoon but she cancelled it -C/w Diuresis with po Lasix 40 mg daily and follow up with Cardiology and Nephrology in the outpatient setting   Irritant Contant Dermatitis, poA -WOC Consult  Hypoalbuminemia -Patient's Albumin Trend: Recent Labs  Lab 06/04/23 0052 06/05/23 0147  ALBUMIN 3.0* 2.8*  -Continue to Monitor and Trend and repeat CMP in the AM  Morbid Obesity -Complicates overall prognosis and care -Estimated body mass index is 40.64 kg/m as calculated from the following:   Height as of this encounter: 5' (1.524 m).   Weight as of this encounter: 94.4 kg.  -Weight Loss and Dietary Counseling given  Consultants: None Procedures performed: As delineated as above  Disposition: Home Diet recommendation:  Discharge Diet Orders (From admission, onward)     Start     Ordered   06/05/23 0000  Diet - low sodium heart healthy        06/05/23 1050           Cardiac diet DISCHARGE MEDICATION: Allergies as of 06/05/2023       Reactions   Dome-paste Bandage [wound Dressings] Other (See Comments)   bleeding  Flecainide Anaphylaxis, Other (See Comments)   Required hospitalization   Flecainide Acetate Anaphylaxis, Shortness Of Breath, Swelling    (hospitalized)   Ciprofloxacin Other (See Comments)   Tingling in fingers        Medication List     TAKE these medications    acetaminophen 650 MG CR tablet Commonly known as: TYLENOL Take 650-1,300 mg by mouth every 8 (eight) hours as needed for pain.   alendronate 10 MG tablet Commonly known as: FOSAMAX TAKE (1) TABLET BY MOUTH PER WEEK TAKE ON EMPTY STOMACH WITH FULL GLASS OF WATER   allopurinol 100 MG tablet Commonly known as: ZYLOPRIM TAKE  1 TABLET BY MOUTH EVERY DAY   apixaban 5 MG Tabs tablet Commonly known as: Eliquis Take 1 tablet (5 mg total) by mouth 2 (two) times daily.   calcium carbonate 500 MG chewable tablet Commonly known as: TUMS - dosed in mg elemental calcium Chew 1-2 tablets by mouth at bedtime.   CALCIUM PO Take 1 tablet by mouth daily.   cholecalciferol 25 MCG (1000 UNIT) tablet Commonly known as: VITAMIN D3 Take 1,000 Units by mouth daily.   diazepam 5 MG tablet Commonly known as: VALIUM TAKE 1/2 TABLET (2.5MG  TOTAL) BY MOUTH AT BEDTIME What changed: See the new instructions.   diclofenac Sodium 1 % Gel Commonly known as: VOLTAREN Apply 2 g topically 4 (four) times daily.   dicyclomine 10 MG capsule Commonly known as: BENTYL Take 10-20 mg by mouth 3 (three) times daily as needed for spasms.   ECHINACEA/GOLDEN SEAL PO Take 1 tablet by mouth daily.   Fish-Flax-Borage Caps Take 1 capsule by mouth daily.   fluticasone 50 MCG/ACT nasal spray Commonly known as: FLONASE Place 1 spray into both nostrils daily.   furosemide 40 MG tablet Commonly known as: LASIX Take 1 tablet (40 mg total) by mouth daily. What changed:  medication strength how much to take additional instructions   Garlic 100 MG Tabs Take by mouth. DAILY   glucosamine-chondroitin 500-400 MG tablet Take 1 tablet by mouth daily.   lisinopril 20 MG tablet Commonly known as: ZESTRIL Patient taking 1/2 tablet (10 mg total) by mouth daily   metoprolol succinate 100 MG 24 hr tablet Commonly known as: TOPROL-XL Take 1.5 tablets (150 mg total) by mouth daily. Take with or immediately following a meal.   Multivitamin Women 50+ Tabs Take 1 tablet by mouth daily.   Emergen-C Immune Pack Take 1 packet by mouth daily.   polycarbophil 625 MG tablet Commonly known as: FIBERCON Take 625 mg by mouth daily.   potassium chloride 20 MEQ packet Commonly known as: KLOR-CON USE 1 PACKET BY MOUTH EVERY DAY What changed: See  the new instructions.   PROBIOTIC-10 PO Take 1 tablet by mouth daily.   QC TUMERIC COMPLEX PO Take by mouth. WITH CONDIN DAILY   rosuvastatin 5 MG tablet Commonly known as: CRESTOR Take 5 mg by mouth at bedtime.   Systane Complete 0.6 % Soln Generic drug: Propylene Glycol Place 1 drop into both eyes 4 (four) times daily as needed.               Discharge Care Instructions  (From admission, onward)           Start     Ordered   06/05/23 0000  Discharge wound care:       Comments: : Wound care to abdominal skin fold with attention to wound at right: Cleanse with house skin cleanse, gently pat  dry. Apply silver hydrofiber (Aquacel AG+ Advantage, [employer] # [id number]) to wound. Apply house antimicrobial moisture wicking textile Arneta Cliche, Order [employer] [place] # [phone]) to pannus according to the instructions below: Measure and cut length of InterDry to fit in skin folds that have skin breakdown  Tuck InterDry fabric into skin folds in a single layer, allow for 2 inches of overhang from skin edges to allow for wicking to occur May remove to bathe; dry area thoroughly and then tuck into affected areas again  Do not apply any creams or ointments when using InterDry DO NOT THROW AWAY FOR 5 DAYS unless soiled with stool DO NOT [facility] product, this will inactivate the silver in the material  New sheet of Interdry should be applied after 5 days of use if patient continues to have skin breakdown   06/05/23 1050            Follow-up Information     [name], NP. Go on 06/19/2023.   Why: @4 :00pm with Dr.Shaw Contact information: [address] [place] [address] [phone]                Discharge Exam: Filed Weights   06/03/23 0428 06/04/23 0440 06/05/23 0414  Weight: 94.6 kg 94.6 kg 94.4 kg   Vitals:   06/05/23 0724 06/05/23 1020  BP: 125/64 (!) 111/96  Pulse: 76 80  Resp: 18 19  Temp: 98 F (36.7 C) 97.9 F (36.6 C)  SpO2: 95% 95%    Examination: Physical Exam:  Constitutional: WN/WD morbidly obese Caucasian female in no acute distress Respiratory: Diminished to auscultation bilaterally, no wheezing, rales, rhonchi or crackles. Normal respiratory effort and patient is not tachypenic. No accessory muscle use.  Unlabored breathing Cardiovascular: RRR, no murmurs / rubs / gallops. S1 and S2 auscultated.  Mild 1+ lower extremity edema Abdomen: Soft, non-tender, distended secondary to body habitus.  Bowel sounds positive.  GU: Deferred. Musculoskeletal: No clubbing / cyanosis of digits/nails. No joint deformity upper and lower extremities Neurologic: CN 2-12 grossly intact with no focal deficits.  Romberg sign and cerebellar reflexes not assessed.  Psychiatric: Normal judgment and insight. Alert and oriented x 3. Normal mood and appropriate affect.   Condition at discharge: stable  The results of significant diagnostics from this hospitalization (including imaging, microbiology, ancillary and laboratory) are listed below for reference.   Imaging Studies: DG CHEST PORT 1 VIEW  Result Date: 06/05/2023 CLINICAL DATA: SHORTNESS OF BREATH: CLINICAL DATA: SHORTNESS OF BREATH 05/27/2023 EXAM: PORTABLE CHEST 1 VIEW COMPARISON:  05/27/2023 FINDINGS: The cardio pericardial silhouette is enlarged. Vascular congestion with diffuse bilateral interstitial opacity is similar to prior. No focal consolidation or substantial pleural effusion. No acute bony abnormality. Telemetry leads overlie the chest. IMPRESSION: Vascular congestion with diffuse bilateral interstitial opacity, likely chronic. Electronically Signed   By: Kennith Center M.D.   On: 06/05/2023 10:49   VAS [place] LOWER EXTREMITY VENOUS (DVT)  Result Date: 06/04/2023  Lower Venous DVT Study Patient Name:  Kristen [name]  Date of Exam:   06/03/2023 Medical Rec #: [id number]         Accession #:    [id number] Date of Birth: 1941-02-16         Patient Gender: F Patient Age:   65 years  Exam Location:  [facility] Procedure:      VAS [place] LOWER EXTREMITY VENOUS (DVT) Referring Phys: Marguerita Merles --------------------------------------------------------------------------------  Indications: Swelling.  Risk Factors: None identified. Limitations: Poor ultrasound/tissue interface and patient pain tolerance.  Comparison Study: No prior studies. Performing Technologist: Chanda Busing RVT  Examination Guidelines: A complete evaluation includes B-mode imaging, spectral Doppler, color Doppler, and power Doppler as needed of all accessible portions of each vessel. Bilateral testing is considered an integral part of a complete examination. Limited examinations for reoccurring indications may be performed as noted. The reflux portion of the exam is performed with the patient in reverse Trendelenburg.  +---------+---------------+---------+-----------+----------+--------------+ RIGHT    CompressibilityPhasicitySpontaneityPropertiesThrombus Aging +---------+---------------+---------+-----------+----------+--------------+ CFV      Full           Yes      Yes                                 +---------+---------------+---------+-----------+----------+--------------+ SFJ      Full                                                        +---------+---------------+---------+-----------+----------+--------------+ FV Prox  Full                                                        +---------+---------------+---------+-----------+----------+--------------+ FV Mid   Full                                                        +---------+---------------+---------+-----------+----------+--------------+ FV DistalFull                                                        +---------+---------------+---------+-----------+----------+--------------+ PFV      Full                                                         +---------+---------------+---------+-----------+----------+--------------+ POP      Full           Yes      Yes                                 +---------+---------------+---------+-----------+----------+--------------+ PTV      Full                                                        +---------+---------------+---------+-----------+----------+--------------+ PERO     Full                                                        +---------+---------------+---------+-----------+----------+--------------+   +---------+---------------+---------+-----------+----------+--------------+  LEFT     CompressibilityPhasicitySpontaneityPropertiesThrombus Aging +---------+---------------+---------+-----------+----------+--------------+ CFV      Full           Yes      Yes                                 +---------+---------------+---------+-----------+----------+--------------+ SFJ      Full                                                        +---------+---------------+---------+-----------+----------+--------------+ FV Prox  Full                                                        +---------+---------------+---------+-----------+----------+--------------+ FV Mid   Full                                                        +---------+---------------+---------+-----------+----------+--------------+ FV DistalFull                                                        +---------+---------------+---------+-----------+----------+--------------+ PFV      Full                                                        +---------+---------------+---------+-----------+----------+--------------+ POP      Full           Yes      Yes                                 +---------+---------------+---------+-----------+----------+--------------+ PTV      Full                                                         +---------+---------------+---------+-----------+----------+--------------+ PERO     Full                                                        +---------+---------------+---------+-----------+----------+--------------+     Summary: RIGHT: - There is no evidence of deep vein thrombosis in the lower extremity.  - No cystic structure found in the popliteal fossa.  LEFT: - There is no evidence of deep vein thrombosis in the lower extremity.  - No  cystic structure found in the popliteal fossa.  *See table(s) above for measurements and observations. Electronically signed by Waverly Ferrari MD on 06/04/2023 at 10:32:13 AM.    Final    DG CHEST PORT 1 VIEW  Result Date: 06/04/2023 CLINICAL DATA:  [id number] CHF exacerbation (HCC) [id number] EXAM: PORTABLE CHEST 1 VIEW COMPARISON:  06/03/2023 FINDINGS: Stable cardiomediastinal contours. Chronically coarsened interstitial markings bilaterally. No new airspace consolidation. No pleural effusion. No evidence of a pneumothorax. Patient's chin obscures the right lung apex. IMPRESSION: Chronically coarsened interstitial markings bilaterally. No new airspace consolidation. Electronically Signed   By: Duanne Guess D.O.   On: 06/04/2023 10:25   ECHOCARDIOGRAM COMPLETE  Result Date: 06/03/2023    ECHOCARDIOGRAM REPORT   Patient Name:   Kristen [name] Date of Exam: 06/03/2023 Medical Rec #:  [id number]        Height:       60.0 in Accession #:    [id number]       Weight:       208.6 lb Date of Birth:  Feb 01, 1941        BSA:          1.900 m Patient Age:    82 years         BP:           118/68 mmHg Patient Gender: F                HR:           70 bpm. Exam Location:  Inpatient Procedure: 2D Echo, Cardiac Doppler and Color Doppler Indications:    CHF- Acute Diastolic  History:        Patient has prior history of Echocardiogram examinations, most                 recent 02/28/2020. CHF, Arrythmias:Atrial Fibrillation,                 Signs/Symptoms:Dyspnea; Risk  Factors:Hypertension and                 Dyslipidemia.  Sonographer:    Raeford Razor Referring Phys: [id number] Emeline General  Sonographer Comments: Image acquisition challenging due to patient body habitus. IMPRESSIONS  1. Left ventricular ejection fraction, by estimation, is 60 to 65%. The left ventricle has normal function. The left ventricle has no regional wall motion abnormalities. There is mild left ventricular hypertrophy. Left ventricular diastolic parameters are indeterminate.  2. Right ventricular systolic function is normal. The right ventricular size is normal.  3. Left atrial size was mildly dilated.  4. The mitral valve is normal in structure. Mild mitral valve regurgitation. No evidence of mitral stenosis.  5. The aortic valve is normal in structure. Aortic valve regurgitation is not visualized. No aortic stenosis is present.  6. The inferior vena cava is normal in size with greater than 50% respiratory variability, suggesting right atrial pressure of 3 mmHg. FINDINGS  Left Ventricle: Left ventricular ejection fraction, by estimation, is 60 to 65%. The left ventricle has normal function. The left ventricle has no regional wall motion abnormalities. The left ventricular internal cavity size was normal in size. There is  mild left ventricular hypertrophy. Left ventricular diastolic parameters are indeterminate. Right Ventricle: The right ventricular size is normal. No increase in right ventricular wall thickness. Right ventricular systolic function is normal. Left Atrium: Left atrial size was mildly dilated. Right Atrium: Right atrial size was normal in size. Pericardium: There is no evidence of pericardial  effusion. Mitral Valve: The mitral valve is normal in structure. Mild mitral valve regurgitation. No evidence of mitral valve stenosis. Tricuspid Valve: The tricuspid valve is normal in structure. Tricuspid valve regurgitation is not demonstrated. No evidence of tricuspid stenosis. Aortic Valve: The aortic  valve is normal in structure. Aortic valve regurgitation is not visualized. No aortic stenosis is present. Aortic valve mean gradient measures 2.5 mmHg. Aortic valve peak gradient measures 4.2 mmHg. Aortic valve area, by VTI measures 1.55 cm. Pulmonic Valve: The pulmonic valve was normal in structure. Pulmonic valve regurgitation is not visualized. No evidence of pulmonic stenosis. Aorta: The aortic root is normal in size and structure. Venous: The inferior vena cava is normal in size with greater than 50% respiratory variability, suggesting right atrial pressure of 3 mmHg. IAS/Shunts: No atrial level shunt detected by color flow Doppler.  LEFT VENTRICLE PLAX 2D LVIDd:         4.20 cm LVIDs:         2.80 cm LV PW:         1.20 cm LV IVS:        1.20 cm LVOT diam:     1.70 cm LV SV:         34 LV SV Index:   18 LVOT Area:     2.27 cm  RIGHT VENTRICLE          IVC RV Basal diam:  3.50 cm  IVC diam: 1.80 cm RV Mid diam:    4.30 cm TAPSE (M-mode): 2.3 cm LEFT ATRIUM             Index        RIGHT ATRIUM           Index LA diam:        4.55 cm 2.39 cm/m   RA Area:     23.50 cm LA Vol (A2C):   74.8 ml 39.37 ml/m  RA Volume:   64.40 ml  33.89 ml/m LA Vol (A4C):   38.1 ml 20.05 ml/m LA Biplane Vol: 56.3 ml 29.63 ml/m  AORTIC VALVE AV Area (Vmax):    1.57 cm AV Area (Vmean):   1.53 cm AV Area (VTI):     1.55 cm AV Vmax:           102.90 cm/s AV Vmean:          71.050 cm/s AV VTI:            0.220 m AV Peak Grad:      4.2 mmHg AV Mean Grad:      2.5 mmHg LVOT Vmax:         71.07 cm/s LVOT Vmean:        47.967 cm/s LVOT VTI:          0.151 m LVOT/AV VTI ratio: 0.68  AORTA Ao Root diam: 3.00 cm Ao Asc diam:  3.80 cm MITRAL VALVE MV Area (PHT): 3.39 cm     SHUNTS MV Decel Time: 224 msec     Systemic VTI:  0.15 m MV E velocity: 107.00 cm/s  Systemic Diam: 1.70 cm Donato Schultz MD Electronically signed by Donato Schultz MD Signature Date/Time: 06/03/2023/11:58:17 AM    Final    DG Chest 1 View  Result Date:  06/03/2023 CLINICAL DATA:  82 year old female with history of congestive heart failure. EXAM: CHEST  1 VIEW COMPARISON:  Chest x-ray 06/02/2023. FINDINGS: Lung volumes are low. Diffuse interstitial prominence and widespread peribronchial cuffing. No confluent consolidative  airspace disease. Bibasilar areas of subsegmental atelectasis and/or scarring. No definite pleural effusions. Pulmonary vasculature appears mildly engorged. Mild cardiomegaly. Upper mediastinal contours are within normal limits. Atherosclerotic calcifications in the thoracic aorta. IMPRESSION: 1. Cardiomegaly with pulmonary venous congestion, but without frank pulmonary edema. 2. Chronic diffuse increased interstitial markings with chronic peribronchial cuffing, similar to prior studies, suggesting chronic bronchitis. 3. Aortic atherosclerosis. Electronically Signed   By: Trudie Reed M.D.   On: 06/03/2023 07:26   DG Chest 2 View  Result Date: 06/02/2023 CLINICAL DATA:  Shortness of breath EXAM: CHEST - 2 VIEW COMPARISON:  X-ray 11/09/2022 FINDINGS: No pneumothorax. Enlarged cardiopericardial silhouette. Calcified aorta. Tiny pleural effusions. There are some interstitial changes seen of the lungs bilaterally. Right midlung scar or atelectasis. No consolidation. Film is rotated to the right. Degenerative changes of the spine. IMPRESSION: Enlarged cardiopericardial silhouette. There is some interstitial changes seen of the lungs which are increasing. Acute process is not excluded. Recommend follow-up. Tiny pleural effusions. Film is rotated to the right. Electronically Signed   By: Karen Kays M.D.   On: 06/02/2023 13:21    Microbiology: Results for orders placed or performed during the hospital encounter of 06/02/23  Resp panel by RT-PCR (RSV, Flu A&B, Covid) Anterior Nasal Swab     Status: None   Collection Time: 06/02/23  3:22 PM   Specimen: Anterior Nasal Swab  Result Value Ref Range Status   SARS Coronavirus 2 by RT PCR  NEGATIVE NEGATIVE Final   Influenza A by PCR NEGATIVE NEGATIVE Final   Influenza B by PCR NEGATIVE NEGATIVE Final    Comment: (NOTE) The Xpert Xpress SARS-CoV-2/FLU/RSV plus assay is intended as an aid in the diagnosis of influenza from Nasopharyngeal swab specimens and should not be used as a sole basis for treatment. Nasal washings and aspirates are unacceptable for Xpert Xpress SARS-CoV-2/FLU/RSV testing.  Fact Sheet for Patients: BloggerCourse.com  Fact Sheet for Healthcare Providers: SeriousBroker.it  This test is not yet approved or cleared by the [place] FDA and has been authorized for detection and/or diagnosis of SARS-CoV-2 by FDA under an Emergency Use Authorization (EUA). This EUA will remain in effect (meaning this test can be used) for the duration of the COVID-19 declaration under Section 564(b)(1) of the Act, 21 U.S.C. section 360bbb-3(b)(1), unless the authorization is terminated or revoked.     Resp Syncytial Virus by PCR NEGATIVE NEGATIVE Final    Comment: (NOTE) Fact Sheet for Patients: BloggerCourse.com  Fact Sheet for Healthcare Providers: SeriousBroker.it  This test is not yet approved or cleared by the [place] FDA and has been authorized for detection and/or diagnosis of SARS-CoV-2 by FDA under an Emergency Use Authorization (EUA). This EUA will remain in effect (meaning this test can be used) for the duration of the COVID-19 declaration under Section 564(b)(1) of the Act, 21 U.S.C. section 360bbb-3(b)(1), unless the authorization is terminated or revoked.  Performed at [facility], [address] [place], [place] [address]    Labs: CBC: Recent Labs  Lab 06/02/23 1213 06/03/23 0128 06/04/23 0052 06/05/23 0147  WBC 15.1* 10.0 11.3* 11.4*  NEUTROABS  --   --  6.4 6.2  HGB 13.1 12.5 12.6 12.3  HCT 40.5 38.2 39.1 37.9  MCV  94.6 93.2 91.6 91.8  PLT 276 255 247 252   Basic Metabolic Panel: Recent Labs  Lab 06/02/23 1213 06/02/23 1457 06/03/23 0128 06/04/23 0052 06/05/23 0147  NA 137  --  136 133* 136  K  3.4*  --  4.1 3.8 4.3  CL 100  --  97* 94* 95*  CO2 25  --  30 30 30   GLUCOSE 110*  --  109* 107* 118*  BUN 23  --  22 30* 32*  CREATININE 0.97  --  1.07* 1.15* 1.39*  CALCIUM 9.1  --  8.7* 8.7* 9.1  MG  --  2.2  --  2.1 2.2  PHOS  --  3.5  --  4.2 4.3   Liver Function Tests: Recent Labs  Lab 06/04/23 0052 06/05/23 0147  AST 23 17  ALT 23 18  ALKPHOS 59 60  BILITOT 1.1 0.8  PROT 6.7 6.5  ALBUMIN 3.0* 2.8*   CBG: No results for input(s): "GLUCAP" in the last 168 hours.  Discharge time spent: greater than 30 minutes.  Signed: Marguerita Merles, DO [facility] 06/07/2023

## 2023-06-05 NOTE — Progress Notes (Signed)
Patient walked hall with mobility tech and oxygen stayed at 98% on room air.

## 2023-06-05 NOTE — Care Management Important Message (Signed)
Important Message  Patient Details  Name: Kristen [name] MRN: [id number] Date of Birth: [birth year]   Medicare Important Message Given:  Yes     Renie Ora 06/05/2023, 8:44 AM

## 2023-06-05 NOTE — Plan of Care (Signed)

## 2023-06-05 NOTE — Progress Notes (Signed)
Kristen [name] to be D/C'd Home per MD order.  Discussed with the patient and all questions fully answered.  VSS, Skin clean, dry and intact without evidence of skin break down, no evidence of skin tears noted. IV catheter discontinued intact. Site without signs and symptoms of complications. Dressing and pressure applied.  An After Visit Summary was printed and given to the patient. Patient prescriptions sent to pharmacy. Patient received taxi voucher for ride home.  D/c education completed with patient/family including follow up instructions, medication list, d/c activities limitations if indicated, with other d/c instructions as indicated by MD - patient able to verbalize understanding, all questions fully answered.   Patient instructed to return to ED, call 911, or call MD for any changes in condition.   Patient escorted via WC, and D/C home via [facility] cab.  Pauletta Browns 06/05/2023 2:14 PM

## 2023-06-05 NOTE — Progress Notes (Signed)
Mobility Specialist Progress Note:  Nurse requested Mobility Specialist to perform oxygen saturation test with pt which includes removing pt from oxygen both at rest and while ambulating.  Below are the results from that testing.     Patient Saturations on Room Air at Rest = spO2 94%%  Patient Saturations on 0 Liters of oxygen while Ambulating = sp02 98%%  At end of testing pt left in room on 0  Liters of oxygen.  Reported results to nurse.    Thompson Grayer Mobility Specialist  Please contact vis Secure Chat or  Rehab Office [phone]

## 2023-06-05 NOTE — TOC Transition Note (Addendum)
Transition of Care [facility]) - CM/SW Discharge Note   Patient Details  Name: Kristen [name] MRN: [id number] Date of Birth: 23-Jul-1941  Transition of Care [facility]) CM/SW Contact:  Leone Haven, RN Phone Number: 06/05/2023, 11:57 AM   Clinical Narrative:    Patient is for dc today, she states she will be checking with [facility] to see if they can transport her home after she eats lunch. CSW saw patient and gave her some resources .    Final next level of care: Home/Self Care Barriers to Discharge: No Barriers Identified   Patient Goals and CMS Choice   Choice offered to / list presented to : NA  Discharge Placement                         Discharge Plan and Services Additional resources added to the After Visit Summary for   In-house Referral: NA Discharge Planning Services: CM Consult Post Acute Care Choice: NA            DME Agency: NA       HH Arranged: NA          Social Determinants of Health (SDOH) Interventions SDOH Screenings   Food Insecurity: Food Insecurity Present (10/25/2019)  Housing: Medium Risk (10/25/2019)  Transportation Needs: No Transportation Needs (10/25/2019)  Depression (PHQ2-9): Low Risk  (12/07/2020)  Financial Resource Strain: Low Risk  (10/11/2018)  Physical Activity: Inactive (10/11/2018)  Social Connections: Somewhat Isolated (10/11/2018)  Stress: No Stress Concern Present (10/11/2018)  Tobacco Use: Low Risk  (04/25/2023)     Readmission Risk Interventions     No data to display

## 2023-06-13 DIAGNOSIS — M7989 Other specified soft tissue disorders: Secondary | ICD-10-CM

## 2023-06-16 ENCOUNTER — Other Ambulatory Visit: Payer: Self-pay | Admitting: Family Medicine

## 2023-06-16 ENCOUNTER — Ambulatory Visit
Admission: RE | Admit: 2023-06-16 | Discharge: 2023-06-16 | Disposition: A | Discharge status: Home / Self Care | Payer: Medicare Other | Source: Ambulatory Visit | Attending: Family Medicine | Admitting: Family Medicine

## 2023-06-16 DIAGNOSIS — R0989 Other specified symptoms and signs involving the circulatory and respiratory systems: Secondary | ICD-10-CM

## 2023-06-21 ENCOUNTER — Ambulatory Visit (HOSPITAL_BASED_OUTPATIENT_CLINIC_OR_DEPARTMENT_OTHER): Payer: Medicare Other | Admitting: Cardiology

## 2023-06-21 ENCOUNTER — Encounter (HOSPITAL_BASED_OUTPATIENT_CLINIC_OR_DEPARTMENT_OTHER): Payer: Self-pay | Admitting: Cardiology

## 2023-06-21 VITALS — BP 98/63 | HR 72 | Ht 60.0 in | Wt 195.4 lb

## 2023-06-21 DIAGNOSIS — I4821 Permanent atrial fibrillation: Secondary | ICD-10-CM | POA: Diagnosis not present

## 2023-06-21 DIAGNOSIS — I872 Venous insufficiency (chronic) (peripheral): Secondary | ICD-10-CM | POA: Diagnosis not present

## 2023-06-21 NOTE — Progress Notes (Signed)
Cardiology Office Note:  .   Date:  06/21/2023  ID:  Kristen [name], DOB April 20, 1941, MRN [id number] PCP: [name], NP  [facility] Providers Cardiologist:  Donato Schultz, MD    History of Present Illness: .   Discussed the use of AI scribe software for clinical note transcription with the patient, who gave verbal consent to proceed.  History of Present Illness   The patient, an 82 year old with a history of diastolic heart failure, presents for a follow-up after a recent hospitalization. She reports feeling lightheaded upon arrival to the clinic, which she attributes to low blood pressure and inadequate hydration. She also mentions a persistent inability to clear her throat over the past few days.  The patient has been managing her heart failure with Lasix and Lisinopril. However, she has noticed a significant decrease in her blood pressure since her hospitalization, which has caused concern.  In addition to her heart failure, the patient has been dealing with lymphedema, which has resulted in swelling in her feet, ankles, and lower legs. She has been using lace bandages and bariatric socks to manage the swelling, as she has been unable to wear her compression stockings due to the severity of the swelling.  The patient also reports a recent weight loss of almost 10 pounds, which she attributes to the Lasix administered during her hospital stay. She has been taking an increased dose of Lasix since her hospitalization, which has helped to reduce the swelling in her feet and the gurgling sounds in her chest.  The patient also mentions a history of atrial fibrillation and a high level of situational stress. She is due for a shoulder replacement surgery, which has been delayed due to her recent health issues. She also reports a loss of strength in her legs, which has resulted in falls and difficulty getting up.       ROS: No syncope  Studies Reviewed: .        Cardiac Studies &  Procedures       ECHOCARDIOGRAM  ECHOCARDIOGRAM COMPLETE 06/03/2023  Narrative ECHOCARDIOGRAM REPORT    Patient Name:   Kristen [name] Date of Exam: 06/03/2023 Medical Rec #:  [id number]        Height:       60.0 in Accession #:    [id number]       Weight:       208.6 lb Date of Birth:  07-23-1941        BSA:          1.900 m Patient Age:    82 years         BP:           118/68 mmHg Patient Gender: F                HR:           70 bpm. Exam Location:  Inpatient  Procedure: 2D Echo, Cardiac Doppler and Color Doppler  Indications:    CHF- Acute Diastolic  History:        Patient has prior history of Echocardiogram examinations, most recent 02/28/2020. CHF, Arrythmias:Atrial Fibrillation, Signs/Symptoms:Dyspnea; Risk Factors:Hypertension and Dyslipidemia.  Sonographer:    Raeford Razor Referring Phys: [id number] Emeline General   Sonographer Comments: Image acquisition challenging due to patient body habitus. IMPRESSIONS   1. Left ventricular ejection fraction, by estimation, is 60 to 65%. The left ventricle has normal function. The left ventricle has no regional wall motion abnormalities. There  is mild left ventricular hypertrophy. Left ventricular diastolic parameters are indeterminate. 2. Right ventricular systolic function is normal. The right ventricular size is normal. 3. Left atrial size was mildly dilated. 4. The mitral valve is normal in structure. Mild mitral valve regurgitation. No evidence of mitral stenosis. 5. The aortic valve is normal in structure. Aortic valve regurgitation is not visualized. No aortic stenosis is present. 6. The inferior vena cava is normal in size with greater than 50% respiratory variability, suggesting right atrial pressure of 3 mmHg.  FINDINGS Left Ventricle: Left ventricular ejection fraction, by estimation, is 60 to 65%. The left ventricle has normal function. The left ventricle has no regional wall motion abnormalities. The left ventricular  internal cavity size was normal in size. There is mild left ventricular hypertrophy. Left ventricular diastolic parameters are indeterminate.  Right Ventricle: The right ventricular size is normal. No increase in right ventricular wall thickness. Right ventricular systolic function is normal.  Left Atrium: Left atrial size was mildly dilated.  Right Atrium: Right atrial size was normal in size.  Pericardium: There is no evidence of pericardial effusion.  Mitral Valve: The mitral valve is normal in structure. Mild mitral valve regurgitation. No evidence of mitral valve stenosis.  Tricuspid Valve: The tricuspid valve is normal in structure. Tricuspid valve regurgitation is not demonstrated. No evidence of tricuspid stenosis.  Aortic Valve: The aortic valve is normal in structure. Aortic valve regurgitation is not visualized. No aortic stenosis is present. Aortic valve mean gradient measures 2.5 mmHg. Aortic valve peak gradient measures 4.2 mmHg. Aortic valve area, by VTI measures 1.55 cm.  Pulmonic Valve: The pulmonic valve was normal in structure. Pulmonic valve regurgitation is not visualized. No evidence of pulmonic stenosis.  Aorta: The aortic root is normal in size and structure.  Venous: The inferior vena cava is normal in size with greater than 50% respiratory variability, suggesting right atrial pressure of 3 mmHg.  IAS/Shunts: No atrial level shunt detected by color flow Doppler.   LEFT VENTRICLE PLAX 2D LVIDd:         4.20 cm LVIDs:         2.80 cm LV PW:         1.20 cm LV IVS:        1.20 cm LVOT diam:     1.70 cm LV SV:         34 LV SV Index:   18 LVOT Area:     2.27 cm   RIGHT VENTRICLE          IVC RV Basal diam:  3.50 cm  IVC diam: 1.80 cm RV Mid diam:    4.30 cm TAPSE (M-mode): 2.3 cm  LEFT ATRIUM             Index        RIGHT ATRIUM           Index LA diam:        4.55 cm 2.39 cm/m   RA Area:     23.50 cm LA Vol (A2C):   74.8 ml 39.37 ml/m  RA  Volume:   64.40 ml  33.89 ml/m LA Vol (A4C):   38.1 ml 20.05 ml/m LA Biplane Vol: 56.3 ml 29.63 ml/m AORTIC VALVE AV Area (Vmax):    1.57 cm AV Area (Vmean):   1.53 cm AV Area (VTI):     1.55 cm AV Vmax:           102.90 cm/s AV Vmean:  71.050 cm/s AV VTI:            0.220 m AV Peak Grad:      4.2 mmHg AV Mean Grad:      2.5 mmHg LVOT Vmax:         71.07 cm/s LVOT Vmean:        47.967 cm/s LVOT VTI:          0.151 m LVOT/AV VTI ratio: 0.68  AORTA Ao Root diam: 3.00 cm Ao Asc diam:  3.80 cm  MITRAL VALVE MV Area (PHT): 3.39 cm     SHUNTS MV Decel Time: 224 msec     Systemic VTI:  0.15 m MV E velocity: 107.00 cm/s  Systemic Diam: 1.70 cm  Donato Schultz MD Electronically signed by Donato Schultz MD Signature Date/Time: 06/03/2023/11:58:17 AM    Final             Risk Assessment/Calculations:           Physical Exam:   VS:  BP 98/63 (BP Location: Left Arm, Patient Position: Sitting, Cuff Size: Normal)   Pulse 72   Ht 5' (1.524 m)   Wt 195 lb 6.4 oz (88.6 kg)   SpO2 95%   BMI 38.16 kg/m    Wt Readings from Last 3 Encounters:  06/21/23 195 lb 6.4 oz (88.6 kg)  06/05/23 208 lb 1.8 oz (94.4 kg)  05/08/23 207 lb (93.9 kg)    GEN: Well nourished, well developed in no acute distress. Urinary aroma. NECK: No JVD; No carotid bruits CARDIAC: Irreg, no murmurs, rubs, gallops RESPIRATORY:  Clear to auscultation without rales, wheezing or rhonchi  ABDOMEN: Soft, non-tender, non-distended EXTREMITIES: Bilateral lymphedema; No deformity   ASSESSMENT AND PLAN: .   Assessment and Plan    Acute on Chronic Diastolic Heart Failure Recent hospitalization due to increased shortness of breath. Currently experiencing lightheadedness and low blood pressure. Lasix increased to 40mg  in the morning, 20mg  in the afternoon, and 20mg  before bedtime. Lisinopril potentially contributing to low blood pressure. -Discontinue Lisinopril 10-20mg  daily. -Continue Lasix at current  dosing schedule (40mg  in the morning, 20mg  in the afternoon, 20mg  before bedtime). -Follow-up with APP in two months.  Lymphedema Recent swelling of feet and ankles. Currently managed with bandages and bariatric socks. -Continue current management with bandages and bariatric socks. -Encourage patient to elevate feet three times a day.  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 -Continue current management and monitor for changes in condition.  Allergies Patient reports high allergies and is currently taking loratadine. -Continue loratadine as prescribed.     Persistent atrial fibrillation - Continue with adequate rate control Toprol    Signed, Donato Schultz, MD

## 2023-06-21 NOTE — Patient Instructions (Signed)
Medication Instructions:  Your physician has recommended you make the following change in your medication:  1) STOP taking lisinopril  *If you need a refill on your cardiac medications before your next appointment, please call your pharmacy*  Follow-Up: At [facility], you and your health needs are our priority.  As part of our continuing mission to provide you with exceptional heart care, we have created designated Provider Care Teams.  These Care Teams include your primary Cardiologist (physician) and Advanced Practice Providers (APPs -  Physician Assistants and Nurse Practitioners) who all work together to provide you with the care you need, when you need it.  Your next appointment:   2 months  Provider:   Jari Favre, PA-C, Robin Searing, NP, Eligha Bridegroom, NP, Tereso Newcomer, PA-C, or Perlie Gold, PA-C    Gillian Shields, NP

## 2023-06-26 ENCOUNTER — Ambulatory Visit: Payer: Medicare Other | Admitting: Physician Assistant

## 2023-08-11 ENCOUNTER — Other Ambulatory Visit: Payer: Self-pay | Admitting: Family Medicine

## 2023-08-11 ENCOUNTER — Ambulatory Visit
Admission: RE | Admit: 2023-08-11 | Discharge: 2023-08-11 | Disposition: A | Discharge status: Home / Self Care | Payer: Medicare Other | Source: Ambulatory Visit | Attending: Family Medicine | Admitting: Family Medicine

## 2023-08-11 DIAGNOSIS — M79642 Pain in left hand: Secondary | ICD-10-CM

## 2023-09-01 ENCOUNTER — Other Ambulatory Visit: Payer: Self-pay | Admitting: Family

## 2023-09-05 ENCOUNTER — Ambulatory Visit: Payer: Medicare Other | Admitting: Cardiology

## 2024-05-31 ENCOUNTER — Other Ambulatory Visit: Payer: Self-pay | Admitting: Family Medicine

## 2024-05-31 DIAGNOSIS — R1310 Dysphagia, unspecified: Secondary | ICD-10-CM

## 2024-06-13 ENCOUNTER — Ambulatory Visit
Admission: RE | Admit: 2024-06-13 | Discharge: 2024-06-13 | Disposition: A | Discharge status: Home / Self Care | Source: Ambulatory Visit | Attending: Family Medicine | Admitting: Family Medicine

## 2024-06-13 DIAGNOSIS — R1310 Dysphagia, unspecified: Secondary | ICD-10-CM

## 2024-07-19 ENCOUNTER — Ambulatory Visit
Admission: RE | Admit: 2024-07-19 | Discharge: 2024-07-19 | Disposition: A | Discharge status: Home / Self Care | Source: Ambulatory Visit | Attending: Family Medicine | Admitting: Family Medicine

## 2024-07-19 ENCOUNTER — Other Ambulatory Visit: Payer: Self-pay | Admitting: Family Medicine

## 2024-07-19 DIAGNOSIS — M7989 Other specified soft tissue disorders: Secondary | ICD-10-CM

## 2024-07-19 DIAGNOSIS — M533 Sacrococcygeal disorders, not elsewhere classified: Secondary | ICD-10-CM

## 2024-07-19 DIAGNOSIS — M79642 Pain in left hand: Secondary | ICD-10-CM

## 2024-11-05 NOTE — Progress Notes (Unsigned)
 "  Office Visit Note  Patient: Kristen [name]             Date of Birth: [birth year]           MRN: [id number]             PCP: Campbell Reynolds, NP Referring: Maree Leni Edyth DELENA, MD Visit Date: 11/14/2024 Occupation: Data Unavailable  Subjective:    History of Present Illness: Kristen [name] is a 83 y.o. female who presents today for a new patient consultation.      Activities of Daily Living:  Patient reports morning stiffness for *** {minute/hour:19697}.   Patient {ACTIONS;DENIES/REPORTS:21021675::Denies} nocturnal pain.  Difficulty dressing/grooming: {ACTIONS;DENIES/REPORTS:21021675::Denies} Difficulty climbing stairs: {ACTIONS;DENIES/REPORTS:21021675::Denies} Difficulty getting out of chair: {ACTIONS;DENIES/REPORTS:21021675::Denies} Difficulty using hands for taps, buttons, cutlery, and/or writing: {ACTIONS;DENIES/REPORTS:21021675::Denies}  No Rheumatology ROS completed.   PMFS History:  Patient Active Problem List   Diagnosis Date Noted   CHF (congestive heart failure) (HCC) 06/02/2023   Occipital neuralgia of right side 06/10/2021   History of lacunar cerebrovascular accident 06/10/2021   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/2021   Ganglion cyst of volar aspect of right wrist 12/07/2020   Chronic daily headache 07/03/2020   Elevated serum creatinine 05/04/2020   Muscle spasms of both lower extremities 05/04/2020   Seborrheic keratoses 04/30/2020   Skin tag 04/08/2020   Fatigue 03/17/2020   Unsatisfactory living conditions 10/23/2019   Hx of cholecystectomy 10/02/2019   Acute renal failure (ARF) 08/29/2019   A-fib (HCC) 08/29/2019   Varicose veins of leg with edema, right 05/21/2019   Bunion of great toe of right foot 05/21/2019   Osteoarthritis 03/13/2018   Gait abnormality 03/13/2018   Flat wart 02/13/2018   Situational mixed anxiety and depressive disorder 10/27/2017   Abnormal Pap smear of cervix 02/08/2016   Chronic  kidney disease 02/08/2016   Urinary frequency 02/08/2016   History of diverticulitis 01/22/2016   Anxiety state 10/17/2014   Long term current use of anticoagulant therapy 06/10/2014   Chronic venous insufficiency 06/10/2014   Obesity (BMI 30-39.9)    Acute on chronic diastolic CHF (congestive heart failure) (HCC)    Candidal intertrigo 06/09/2014   Insomnia 01/10/2011   Permanent atrial fibrillation (HCC)    OSTEOPENIA 01/31/2008   Hypertensive heart disease     Past Medical History:  Diagnosis Date   Abscess of abdominal cavity (HCC) 10/02/2019   Anticoagulant long-term use    elquis -- managed by cardiology   Anxiety    SITUATIONAL IN PAST   Atrial fibrillation (HCC)    Chronic calculous cholecystitis    Chronic kidney disease    ckd stage 4 per lov dr curtis heman 09-28-2020 on chart   Chronic venous insufficiency    w/  varicose veins right worse than left waers compresiion stocking prn   Depression    SITUATIONAL IN PAST   Diastolic CHF, chronic (HCC) 2010   followed by cardiology HX OF 2010 DUE TO FLECANIDE   Diverticulosis of colon    Dyspnea    occ with heavt activity and at bedtime when tired for last few months per pt   Dysrhythmia    a-fib   Edema of both lower extremities    per pt mostly in summer time   Essential hypertension, benign    Fibrocystic breast disease    Full dentures    GERD (gastroesophageal reflux disease)    occasional tums and does not eat prior to bedtime   Gout  08-19-2019  per pt last episode DEC 2021 LEFT HAND   Hiatal hernia    History of diverticulitis 01/22/2016   History of recurrent UTIs    NONE RECNET SEES DR WRENN   Hx of cholecystectomy 10/02/2019   Hyperlipidemia    Insomnia    Migraines    OA (osteoarthritis)    knees, lower back   Permanent atrial fibrillation [facility]) cardiologist--- dr bernie   first dx 10/ 2011--- histroy DCCV 11-25-2010 by dr blanca (pt's previous  cardiologist) and Cardiac cath 12-17-2010 no significant disease   Pneumonia 08/2019   COLLAPSED LEFT LUNG AND PNEUMONIA   Scoliosis    Stress incontinence in female     Family History  Problem Relation Age of Onset   Cancer Mother        ovarian   Kidney disease Father    Alcohol  abuse Father    Cancer Maternal Aunt        lung- smoker   Heart disease Maternal Uncle    Past Surgical History:  Procedure Laterality Date   BILIARY STENT PLACEMENT N/A 09/03/2019   Procedure: BILIARY STENT PLACEMENT;  Surgeon: Rosalie Kitchens, MD;  Location: [facility];  Service: Endoscopy;  Laterality: N/A;   BLEPHAROPLASTY Bilateral 02-22-2010  dr orion   upper eyelid's   both knees cortisone injection  08/18/2020   dr jane   BUNIONECTOMY Right 2003   CATARACT EXTRACTION W/ INTRAOCULAR LENS  IMPLANT, BILATERAL  2018   CERVICAL CONIZATION W/BX N/A 11/05/2020   Procedure: CONIZATION CERVIX WITH BIOPSY;  Surgeon: Austin Ned, MD;  Location: [facility];  Service: Gynecology;  Laterality: N/A;   CHOLECYSTECTOMY N/A 08/22/2019   Procedure: LAPAROSCOPIC CHOLECYSTECTOMY;  Surgeon: [name], Herlene Righter, MD;  Location: [facility];  Service: General;  Laterality: N/A;   ERCP N/A 09/03/2019   Procedure: ENDOSCOPIC RETROGRADE CHOLANGIOPANCREATOGRAPHY (ERCP);  Surgeon: Rosalie Kitchens, MD;  Location: [facility];  Service: Endoscopy;  Laterality: N/A;   ESOPHAGOGASTRODUODENOSCOPY (EGD) WITH PROPOFOL  N/A 12/06/2019   Procedure: ESOPHAGOGASTRODUODENOSCOPY (EGD) WITH PROPOFOL ;  Surgeon: Rosalie Kitchens, MD;  Location: [facility];  Service: Endoscopy;  Laterality: N/A;  stent removal, ERCP Scope, needs Flouro   HYSTEROSCOPY WITH D & C N/A 11/05/2020   Procedure: DILATATION AND CURETTAGE /HYSTEROSCOPY WITH MYOSURE;  Surgeon: Austin Ned, MD;  Location: [facility];  Service: Gynecology;  Laterality: N/A;   IR SINUS/FIST TUBE CHK-NON GI  09/10/2019    IR SINUS/FIST TUBE CHK-NON GI  09/24/2019   KNEE ARTHROSCOPY Left 2002   meniscus repair   REMOVAL OF STONES  09/03/2019   Procedure: REMOVAL OF STONES;  Surgeon: Rosalie Kitchens, MD;  Location: [facility];  Service: Endoscopy;;   SPHINCTEROTOMY  09/03/2019   Procedure: ANNETT;  Surgeon: Rosalie Kitchens, MD;  Location: [facility];  Service: Endoscopy;;   STENT REMOVAL  12/06/2019   Procedure: STENT REMOVAL;  Surgeon: Rosalie Kitchens, MD;  Location: [facility];  Service: Endoscopy;;   TUBAL LIGATION Bilateral 1980   AND RIGHT BREAST EXCISION CYST (BENIGN)   Social History[1] Social History   Social History Narrative   Emergency Contact: Alm Gasmen (son) [phone]   Darice Hacking (friend) cell: [phone] work: [phone] *first POA   Jennine Bowl (friend) cell: [phone] home: [phone] * second POA   Who lives with you: self   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 Exercise: Pt  dances regularly for shows   Seatbelts: Pt reports wearing seatbelt when in vehicles.    Sun Exposure/Protection: Pt reports not being in the sun very much   Hobbies: dancing, painting,    Working smoke alarm: yes   Home throw rugs: yes   Home free from clutter: yes   ______________________________________________________________________________________   Current Social History     Who lives at home: lives alone, retired engineer, site and librarian  10/25/2019    Transportation: provided by [facility] and her female friend 10/25/2019   Important Relationships & Pets: has cats and a friend that she talks to or sees daily.  Reports no family local 10/25/2019    Current Stressors: clutter in her home 10/25/2019   Other: Unable to prepare meals and needs help with keeping her home clean.  Describes house as being cluttered with narrow paths in her trailer.  No cental heat uses space heaters for the past year.  Leaking kitchen causes water bill to be higher than it should be 10/25/2019   Barnie Ada, LCSW   Clinical Social Worker                                                     Caffeine: 1-2 cup of coffee a day      Right handed       Lives alone w 4 cats                                                     Immunization History  Administered Date(s) Administered   INFLUENZA, HIGH DOSE SEASONAL PF 10/05/2017, 08/29/2018   Influenza Whole 12/10/2009   Influenza,inj,Quad PF,6+ Mos 10/27/2020   Influenza-Unspecified 07/10/2015, 10/12/2017   PFIZER(Purple Top)SARS-COV-2 Vaccination 01/07/2020, 02/07/2020, 10/27/2020   Pneumococcal Conjugate-13 07/10/2015   Pneumococcal Polysaccharide-23 11/10/2017, 09/16/2018   Td 01/31/2008   Tdap 02/08/2022     Objective: Vital Signs: There were no vitals taken for this visit.   Physical Exam Vitals and nursing note reviewed.  Constitutional:      Appearance: She is well-developed.  HENT:     Head: Normocephalic and atraumatic.  Eyes:     Conjunctiva/sclera: Conjunctivae normal.  Cardiovascular:     Rate and Rhythm: Normal rate and regular rhythm.     Heart sounds: Normal heart sounds.  Pulmonary:     Effort: Pulmonary effort is normal.     Breath sounds: Normal breath sounds.  Abdominal:     General: Bowel sounds are normal.     Palpations: Abdomen is soft.  Musculoskeletal:     Cervical back: Normal range of motion.  Lymphadenopathy:     Cervical: No cervical adenopathy.  Skin:    General: Skin is warm and dry.     Capillary Refill: Capillary refill takes less than 2 seconds.  Neurological:     Mental Status: She is alert and oriented to person, place, and time.  Psychiatric:        Behavior: Behavior normal.     Musculoskeletal Exam: ***  CDAI Exam: CDAI Score: -- Patient Global: --; Provider Global: -- Swollen: --; Tender: -- Joint Exam 11/14/2024   No joint exam has been documented  for this visit   There is  currently no information documented on the homunculus. Go to the Rheumatology activity and complete the homunculus joint exam.  Investigation: No additional findings.  Imaging: No results found.  Recent Labs: Lab Results  Component Value Date   WBC 11.4 (H) 06/05/2023   HGB 12.3 06/05/2023   PLT 252 06/05/2023   NA 136 06/05/2023   K 4.3 06/05/2023   CL 95 (L) 06/05/2023   CO2 30 06/05/2023   GLUCOSE 118 (H) 06/05/2023   BUN 32 (H) 06/05/2023   CREATININE 1.39 (H) 06/05/2023   BILITOT 0.8 06/05/2023   ALKPHOS 60 06/05/2023   AST 17 06/05/2023   ALT 18 06/05/2023   PROT 6.5 06/05/2023   ALBUMIN 2.8 (L) 06/05/2023   CALCIUM  9.1 06/05/2023   GFRAA 32 (L) 07/08/2020    Speciality Comments: No specialty comments available.  Procedures:  No procedures performed Allergies: Dome-paste bandage [wound dressings], Flecainide, Flecainide acetate, and Ciprofloxacin   Assessment / Plan:     Visit Diagnoses: No diagnosis found.  Orders: No orders of the defined types were placed in this encounter.  No orders of the defined types were placed in this encounter.   Face-to-face time spent with patient was *** minutes. Greater than 50% of time was spent in counseling and coordination of care.  Follow-Up Instructions: No follow-ups on file.   Waddell CHRISTELLA Craze, PA-C  Note - This record has been created using Dragon software.  Chart creation errors have been sought, but may not always  have been located. Such creation errors do not reflect on  the standard of medical care.      [1] Social History Tobacco Use   Smoking status: Never   Smokeless tobacco: Never  Vaping Use   Vaping status: Never Used  Substance Use Topics   Alcohol  use: Not Currently   Drug use: Never  "

## 2024-11-14 ENCOUNTER — Ambulatory Visit: Admitting: Physician Assistant

## 2024-11-14 DIAGNOSIS — Z8639 Personal history of other endocrine, nutritional and metabolic disease: Secondary | ICD-10-CM

## 2024-11-14 DIAGNOSIS — I10 Essential (primary) hypertension: Secondary | ICD-10-CM

## 2024-11-14 DIAGNOSIS — I480 Paroxysmal atrial fibrillation: Secondary | ICD-10-CM

## 2024-11-14 DIAGNOSIS — I872 Venous insufficiency (chronic) (peripheral): Secondary | ICD-10-CM

## 2024-11-14 DIAGNOSIS — G8929 Other chronic pain: Secondary | ICD-10-CM

## 2024-11-14 DIAGNOSIS — M79641 Pain in right hand: Secondary | ICD-10-CM

## 2024-11-14 DIAGNOSIS — Z8719 Personal history of other diseases of the digestive system: Secondary | ICD-10-CM

## 2024-11-14 DIAGNOSIS — I890 Lymphedema, not elsewhere classified: Secondary | ICD-10-CM

## 2024-11-14 DIAGNOSIS — Z8261 Family history of arthritis: Secondary | ICD-10-CM

## 2024-11-14 DIAGNOSIS — N1831 Chronic kidney disease, stage 3a: Secondary | ICD-10-CM

## 2024-11-14 DIAGNOSIS — Z8739 Personal history of other diseases of the musculoskeletal system and connective tissue: Secondary | ICD-10-CM

## 2024-12-20 ENCOUNTER — Ambulatory Visit: Admitting: Physician Assistant

## 2024-12-20 DIAGNOSIS — R7681 Abnormal rheumatoid factor and anti-citrullinated protein antibody without rheumatoid arthritis: Secondary | ICD-10-CM

## 2024-12-20 DIAGNOSIS — Z7901 Long term (current) use of anticoagulants: Secondary | ICD-10-CM

## 2024-12-20 DIAGNOSIS — F4323 Adjustment disorder with mixed anxiety and depressed mood: Secondary | ICD-10-CM

## 2024-12-20 DIAGNOSIS — I872 Venous insufficiency (chronic) (peripheral): Secondary | ICD-10-CM

## 2024-12-20 DIAGNOSIS — I5033 Acute on chronic diastolic (congestive) heart failure: Secondary | ICD-10-CM

## 2024-12-20 DIAGNOSIS — G4733 Obstructive sleep apnea (adult) (pediatric): Secondary | ICD-10-CM

## 2024-12-20 DIAGNOSIS — I679 Cerebrovascular disease, unspecified: Secondary | ICD-10-CM

## 2024-12-20 DIAGNOSIS — I110 Hypertensive heart disease with heart failure: Secondary | ICD-10-CM

## 2024-12-20 DIAGNOSIS — I4821 Permanent atrial fibrillation: Secondary | ICD-10-CM

## 2024-12-20 DIAGNOSIS — M170 Bilateral primary osteoarthritis of knee: Secondary | ICD-10-CM

## 2024-12-20 DIAGNOSIS — N1831 Chronic kidney disease, stage 3a: Secondary | ICD-10-CM

## 2024-12-20 DIAGNOSIS — I83891 Varicose veins of right lower extremities with other complications: Secondary | ICD-10-CM

## 2024-12-20 DIAGNOSIS — Z8719 Personal history of other diseases of the digestive system: Secondary | ICD-10-CM

## 2024-12-20 DIAGNOSIS — M4316 Spondylolisthesis, lumbar region: Secondary | ICD-10-CM

## 2024-12-20 DIAGNOSIS — M5481 Occipital neuralgia: Secondary | ICD-10-CM

## 2024-12-20 DIAGNOSIS — Z8261 Family history of arthritis: Secondary | ICD-10-CM

## 2024-12-20 DIAGNOSIS — Z9049 Acquired absence of other specified parts of digestive tract: Secondary | ICD-10-CM

## 2024-12-20 DIAGNOSIS — Z8739 Personal history of other diseases of the musculoskeletal system and connective tissue: Secondary | ICD-10-CM

## 2024-12-20 DIAGNOSIS — Z8673 Personal history of transient ischemic attack (TIA), and cerebral infarction without residual deficits: Secondary | ICD-10-CM

## 2024-12-20 DIAGNOSIS — M2550 Pain in unspecified joint: Secondary | ICD-10-CM
